# Patient Record
Sex: Female | Born: 1935
Health system: Southern US, Community
[De-identification: ages and names within clinical notes are randomized; demographics above are authoritative.]

## PROBLEM LIST (undated history)

## (undated) DIAGNOSIS — I4891 Unspecified atrial fibrillation: Secondary | ICD-10-CM

## (undated) DIAGNOSIS — R519 Headache, unspecified: Secondary | ICD-10-CM

## (undated) DIAGNOSIS — I340 Nonrheumatic mitral (valve) insufficiency: Secondary | ICD-10-CM

## (undated) DIAGNOSIS — C4442 Squamous cell carcinoma of skin of scalp and neck: Secondary | ICD-10-CM

## (undated) DIAGNOSIS — J189 Pneumonia, unspecified organism: Secondary | ICD-10-CM

## (undated) DIAGNOSIS — I341 Nonrheumatic mitral (valve) prolapse: Secondary | ICD-10-CM

## (undated) DIAGNOSIS — I472 Ventricular tachycardia: Secondary | ICD-10-CM

## (undated) DIAGNOSIS — K219 Gastro-esophageal reflux disease without esophagitis: Secondary | ICD-10-CM

## (undated) DIAGNOSIS — K5790 Diverticulosis of intestine, part unspecified, without perforation or abscess without bleeding: Secondary | ICD-10-CM

## (undated) DIAGNOSIS — M419 Scoliosis, unspecified: Secondary | ICD-10-CM

## (undated) DIAGNOSIS — F329 Major depressive disorder, single episode, unspecified: Secondary | ICD-10-CM

## (undated) DIAGNOSIS — F419 Anxiety disorder, unspecified: Secondary | ICD-10-CM

## (undated) DIAGNOSIS — I429 Cardiomyopathy, unspecified: Secondary | ICD-10-CM

## (undated) DIAGNOSIS — R51 Headache: Secondary | ICD-10-CM

## (undated) DIAGNOSIS — C44712 Basal cell carcinoma of skin of right lower limb, including hip: Secondary | ICD-10-CM

## (undated) DIAGNOSIS — I493 Ventricular premature depolarization: Secondary | ICD-10-CM

## (undated) DIAGNOSIS — I480 Paroxysmal atrial fibrillation: Secondary | ICD-10-CM

## (undated) DIAGNOSIS — R001 Bradycardia, unspecified: Secondary | ICD-10-CM

## (undated) DIAGNOSIS — G43909 Migraine, unspecified, not intractable, without status migrainosus: Secondary | ICD-10-CM

## (undated) DIAGNOSIS — M1990 Unspecified osteoarthritis, unspecified site: Secondary | ICD-10-CM

## (undated) DIAGNOSIS — F32A Depression, unspecified: Secondary | ICD-10-CM

## (undated) DIAGNOSIS — E7800 Pure hypercholesterolemia, unspecified: Secondary | ICD-10-CM

## (undated) DIAGNOSIS — I4729 Other ventricular tachycardia: Secondary | ICD-10-CM

## (undated) HISTORY — PX: BASAL CELL CARCINOMA EXCISION: SHX1214

## (undated) HISTORY — DX: Nonrheumatic mitral (valve) prolapse: I34.1

## (undated) HISTORY — DX: Diverticulosis of intestine, part unspecified, without perforation or abscess without bleeding: K57.90

## (undated) HISTORY — PX: EXCISIONAL HEMORRHOIDECTOMY: SHX1541

## (undated) HISTORY — DX: Other ventricular tachycardia: I47.29

## (undated) HISTORY — PX: BUNIONECTOMY: SHX129

## (undated) HISTORY — DX: Cardiomyopathy, unspecified: I42.9

## (undated) HISTORY — PX: BREAST CYST EXCISION: SHX579

## (undated) HISTORY — DX: Paroxysmal atrial fibrillation: I48.0

## (undated) HISTORY — PX: TONSILLECTOMY: SUR1361

## (undated) HISTORY — DX: Bradycardia, unspecified: R00.1

## (undated) HISTORY — DX: Nonrheumatic mitral (valve) insufficiency: I34.0

## (undated) HISTORY — DX: Ventricular premature depolarization: I49.3

## (undated) HISTORY — DX: Ventricular tachycardia: I47.2

## (undated) HISTORY — DX: Scoliosis, unspecified: M41.9

## (undated) HISTORY — DX: Pure hypercholesterolemia, unspecified: E78.00

## (undated) HISTORY — DX: Unspecified atrial fibrillation: I48.91

## (undated) HISTORY — PX: BREAST BIOPSY: SHX20

## (undated) HISTORY — PX: DILATION AND CURETTAGE OF UTERUS: SHX78

## (undated) HISTORY — PX: BLEPHAROPLASTY: SUR158

## (undated) HISTORY — PX: SQUAMOUS CELL CARCINOMA EXCISION: SHX2433

## (undated) HISTORY — PX: BREAST CYST ASPIRATION: SHX578

---

## 1998-04-09 ENCOUNTER — Other Ambulatory Visit: Admission: RE | Admit: 1998-04-09 | Discharge: 1998-04-09 | Payer: Self-pay | Admitting: Gynecology

## 1998-05-23 ENCOUNTER — Ambulatory Visit (HOSPITAL_COMMUNITY): Admission: RE | Admit: 1998-05-23 | Discharge: 1998-05-23 | Payer: Self-pay | Admitting: Gynecology

## 1998-08-01 ENCOUNTER — Emergency Department (HOSPITAL_COMMUNITY): Admission: EM | Admit: 1998-08-01 | Discharge: 1998-08-02 | Payer: Self-pay | Admitting: Emergency Medicine

## 2000-07-14 ENCOUNTER — Other Ambulatory Visit: Admission: RE | Admit: 2000-07-14 | Discharge: 2000-07-14 | Payer: Self-pay | Admitting: Gynecology

## 2001-08-02 ENCOUNTER — Other Ambulatory Visit: Admission: RE | Admit: 2001-08-02 | Discharge: 2001-08-02 | Payer: Self-pay | Admitting: Gynecology

## 2001-11-27 ENCOUNTER — Encounter: Admission: RE | Admit: 2001-11-27 | Discharge: 2001-11-27 | Payer: Self-pay | Admitting: Internal Medicine

## 2001-11-27 ENCOUNTER — Encounter: Payer: Self-pay | Admitting: Internal Medicine

## 2001-12-11 ENCOUNTER — Emergency Department (HOSPITAL_COMMUNITY): Admission: EM | Admit: 2001-12-11 | Discharge: 2001-12-11 | Payer: Self-pay | Admitting: Emergency Medicine

## 2001-12-11 ENCOUNTER — Encounter: Payer: Self-pay | Admitting: Emergency Medicine

## 2002-05-04 ENCOUNTER — Encounter: Admission: RE | Admit: 2002-05-04 | Discharge: 2002-05-14 | Payer: Self-pay | Admitting: Orthopedic Surgery

## 2004-06-23 ENCOUNTER — Other Ambulatory Visit: Admission: RE | Admit: 2004-06-23 | Discharge: 2004-06-23 | Payer: Self-pay | Admitting: Gynecology

## 2005-07-29 ENCOUNTER — Other Ambulatory Visit: Admission: RE | Admit: 2005-07-29 | Discharge: 2005-07-29 | Payer: Self-pay | Admitting: Gynecology

## 2007-04-12 HISTORY — PX: KNEE ARTHROSCOPY: SHX127

## 2007-06-07 ENCOUNTER — Other Ambulatory Visit: Admission: RE | Admit: 2007-06-07 | Discharge: 2007-06-07 | Payer: Self-pay | Admitting: Gynecology

## 2008-05-09 ENCOUNTER — Ambulatory Visit: Payer: Self-pay | Admitting: Internal Medicine

## 2008-05-09 DIAGNOSIS — R059 Cough, unspecified: Secondary | ICD-10-CM | POA: Insufficient documentation

## 2008-05-09 DIAGNOSIS — R05 Cough: Secondary | ICD-10-CM

## 2008-05-10 ENCOUNTER — Ambulatory Visit (HOSPITAL_COMMUNITY): Admission: RE | Admit: 2008-05-10 | Discharge: 2008-05-10 | Payer: Self-pay | Admitting: Internal Medicine

## 2008-05-15 DIAGNOSIS — J309 Allergic rhinitis, unspecified: Secondary | ICD-10-CM | POA: Insufficient documentation

## 2008-05-17 ENCOUNTER — Telehealth: Payer: Self-pay | Admitting: Internal Medicine

## 2008-05-28 ENCOUNTER — Ambulatory Visit: Payer: Self-pay | Admitting: Internal Medicine

## 2008-05-31 DIAGNOSIS — R0602 Shortness of breath: Secondary | ICD-10-CM | POA: Insufficient documentation

## 2008-05-31 DIAGNOSIS — R0600 Dyspnea, unspecified: Secondary | ICD-10-CM | POA: Insufficient documentation

## 2008-06-10 ENCOUNTER — Ambulatory Visit: Payer: Self-pay | Admitting: Internal Medicine

## 2008-06-10 DIAGNOSIS — J209 Acute bronchitis, unspecified: Secondary | ICD-10-CM | POA: Insufficient documentation

## 2008-07-11 ENCOUNTER — Other Ambulatory Visit: Admission: RE | Admit: 2008-07-11 | Discharge: 2008-07-11 | Payer: Self-pay | Admitting: Gynecology

## 2008-08-08 ENCOUNTER — Ambulatory Visit (HOSPITAL_COMMUNITY): Admission: RE | Admit: 2008-08-08 | Discharge: 2008-08-08 | Payer: Self-pay | Admitting: Gynecology

## 2008-08-08 ENCOUNTER — Ambulatory Visit: Payer: Self-pay | Admitting: Internal Medicine

## 2008-08-14 ENCOUNTER — Encounter: Admission: RE | Admit: 2008-08-14 | Discharge: 2008-08-14 | Payer: Self-pay | Admitting: Gynecology

## 2008-08-16 ENCOUNTER — Emergency Department (HOSPITAL_COMMUNITY): Admission: EM | Admit: 2008-08-16 | Discharge: 2008-08-17 | Payer: Self-pay | Admitting: Emergency Medicine

## 2009-07-29 ENCOUNTER — Ambulatory Visit: Payer: Self-pay | Admitting: Internal Medicine

## 2009-07-29 ENCOUNTER — Telehealth (INDEPENDENT_AMBULATORY_CARE_PROVIDER_SITE_OTHER): Payer: Self-pay | Admitting: *Deleted

## 2009-12-29 ENCOUNTER — Emergency Department (HOSPITAL_COMMUNITY): Admission: EM | Admit: 2009-12-29 | Discharge: 2009-12-29 | Payer: Self-pay | Admitting: Emergency Medicine

## 2009-12-29 ENCOUNTER — Encounter: Admission: RE | Admit: 2009-12-29 | Discharge: 2009-12-29 | Payer: Self-pay | Admitting: Cardiology

## 2010-06-24 ENCOUNTER — Ambulatory Visit (HOSPITAL_COMMUNITY): Admission: RE | Admit: 2010-06-24 | Discharge: 2010-06-24 | Payer: Self-pay | Admitting: Cardiology

## 2010-06-24 ENCOUNTER — Ambulatory Visit: Payer: Self-pay | Admitting: Cardiology

## 2010-12-22 ENCOUNTER — Ambulatory Visit: Payer: Self-pay | Admitting: Cardiology

## 2011-04-23 ENCOUNTER — Encounter: Payer: Self-pay | Admitting: *Deleted

## 2011-04-23 DIAGNOSIS — M419 Scoliosis, unspecified: Secondary | ICD-10-CM | POA: Insufficient documentation

## 2011-04-23 DIAGNOSIS — E7800 Pure hypercholesterolemia, unspecified: Secondary | ICD-10-CM | POA: Insufficient documentation

## 2011-04-23 DIAGNOSIS — Z8659 Personal history of other mental and behavioral disorders: Secondary | ICD-10-CM | POA: Insufficient documentation

## 2011-04-30 ENCOUNTER — Encounter: Payer: Self-pay | Admitting: Nurse Practitioner

## 2011-04-30 ENCOUNTER — Ambulatory Visit (INDEPENDENT_AMBULATORY_CARE_PROVIDER_SITE_OTHER): Payer: Medicare Other | Admitting: Nurse Practitioner

## 2011-04-30 VITALS — BP 120/72 | HR 60 | Wt 156.0 lb

## 2011-04-30 DIAGNOSIS — R002 Palpitations: Secondary | ICD-10-CM

## 2011-04-30 DIAGNOSIS — Z8659 Personal history of other mental and behavioral disorders: Secondary | ICD-10-CM

## 2011-04-30 NOTE — Assessment & Plan Note (Signed)
Felt to be due to her MVP and MR. Last echo was in September of 2011. She does not like the Zebeta. She thinks it is aggravating her fatigue. We will try Bystolic 2.5 mg for the next month. I will see her back in a month. Patient is agreeable to this plan and will call if any problems develop in the interim.

## 2011-04-30 NOTE — Assessment & Plan Note (Signed)
She seems depressed to me today. I encouraged her to speak with Dr. Toy Care about other add on therapies. Exercise is also encouraged.

## 2011-04-30 NOTE — Progress Notes (Signed)
    [name] Date of Birth: October 22, 1932   History of Present Illness: Kawanis is seen today for a work in visit. She is seen for Dr. Mare Ferrari. She missed her appointment in March. Her husband did not understand the message from "[place]". She is still fatigued. She does not like the Zebeta. She still has some palpitations. She is not all that good about her caffeine intake. She has a history of depression. Dr. Toy Care had tried her on Abilify. This only aggravated her palpitations. She does remain depressed. She has MVP with MR. Last echo was in 2011.   Current Outpatient Prescriptions on File Prior to Visit  Medication Sig Dispense Refill  . bisoprolol (ZEBETA) 5 MG tablet Take 5 mg by mouth daily. Takes 1/2 daily       . Calcium Carbonate-Vitamin D (CALTRATE 600+D PO) Take by mouth.       . escitalopram (LEXAPRO) 20 MG tablet Take 20 mg by mouth daily. Per Dr. Toy Care       . LORazepam (ATIVAN) 1 MG tablet Take 1 mg by mouth at bedtime as needed.          Allergies  Allergen Reactions  . Paxil     HA  . Penicillins   . Toprol Xl (Metoprolol Succinate)     dizziness  . Vancomycin     Past Medical History  Diagnosis Date  . MVP (mitral valve prolapse)   . Palpitations   . Hypercholesterolemia   . History of anxiety   . History of depression   . Diverticulosis     Hx. of  . Scoliosis     mild  . Osteoporosis     Past Surgical History  Procedure Date  . Other surgical history 04/12/2007    ortopedic,arthroscopic right knee  . Tonsillectomy   . Blepharoplasty     History  Smoking status  . Former Smoker  Smokeless tobacco  . Not on file    History  Alcohol Use No    Family History  Problem Relation Age of Onset  . Heart failure Mother   . Leukemia Mother   . Heart failure Father     Review of Systems: The review of systems is positive for depression. She does not exercise. Sleeping too much.  All other systems were reviewed and are  negative.  Physical Exam: BP 120/72  Pulse 60  Wt 156 lb (70.761 kg) Patient is very pleasant and in no acute distress. She does seem depressed to me. Skin is warm and dry. Color is normal.  HEENT is unremarkable. Normocephalic/atraumatic. PERRL. Sclera are nonicteric. Neck is supple. No masses. No JVD. Lungs are clear. Cardiac exam shows a regular rate and rhythm with an apical murmur. Abdomen is soft. Extremities are without edema. Gait and ROM are intact. No gross neurologic deficits noted.  LABORATORY DATA:   Assessment / Plan:

## 2011-04-30 NOTE — Patient Instructions (Signed)
Lets try the Bystolic 1/2 tablet in the place of the Zebeta I will see you in a month Discuss with Dr. Toy Care your lexapro. ? Change or add to it. No more stimulant type medicines

## 2011-05-04 ENCOUNTER — Telehealth: Payer: Self-pay | Admitting: Cardiology

## 2011-05-04 NOTE — Telephone Encounter (Signed)
Called because the [employer] prescribed does not have a half line down the middle and she tried to cut the pill in half and could not. Please call back. I have pulled the chart.

## 2011-05-04 NOTE — Telephone Encounter (Signed)
Spoke with Cecille Rubin, ok to try cutting Bystolic in half.  Will get a pill cutter and if has problems cutting will call back

## 2011-06-01 ENCOUNTER — Ambulatory Visit: Payer: Medicare Other | Admitting: Nurse Practitioner

## 2011-06-03 ENCOUNTER — Encounter: Payer: Self-pay | Admitting: Nurse Practitioner

## 2011-06-03 ENCOUNTER — Ambulatory Visit (INDEPENDENT_AMBULATORY_CARE_PROVIDER_SITE_OTHER): Payer: Medicare Other | Admitting: Nurse Practitioner

## 2011-06-03 VITALS — BP 102/70 | HR 60 | Ht 65.0 in | Wt 155.8 lb

## 2011-06-03 DIAGNOSIS — F32A Depression, unspecified: Secondary | ICD-10-CM

## 2011-06-03 DIAGNOSIS — F329 Major depressive disorder, single episode, unspecified: Secondary | ICD-10-CM

## 2011-06-03 DIAGNOSIS — Z8659 Personal history of other mental and behavioral disorders: Secondary | ICD-10-CM

## 2011-06-03 DIAGNOSIS — R002 Palpitations: Secondary | ICD-10-CM

## 2011-06-03 NOTE — Progress Notes (Signed)
    [name] Date of Birth: [birth year]   History of Present Illness: [name] is seen back today for a one month check. She is seen for Dr. Mare Ferrari. She is doing better. She has had Wellbutrin added to her Lexapro. I switched her to Bystolic. She is doing better. She saw Dr. Toy Care yesterday who also thought she was better. Her palpitations seem better. She is more active. No chest pain.   Current Outpatient Prescriptions on File Prior to Visit  Medication Sig Dispense Refill  . buPROPion (WELLBUTRIN SR) 150 MG 12 hr tablet Take 150 mg by mouth as needed.        . Calcium Carbonate-Vitamin D (CALTRATE 600+D PO) Take by mouth.       . escitalopram (LEXAPRO) 20 MG tablet Take 20 mg by mouth daily. Per Dr. Toy Care       . LORazepam (ATIVAN) 1 MG tablet Take 1 mg by mouth at bedtime as needed.        . nebivolol (BYSTOLIC) 5 MG tablet Take 2.5 mg by mouth daily.          Allergies  Allergen Reactions  . Paxil     HA  . Penicillins   . Toprol Xl (Metoprolol Succinate)     dizziness  . Vancomycin     Past Medical History  Diagnosis Date  . MVP (mitral valve prolapse)   . Palpitations   . Hypercholesterolemia   . History of anxiety   . History of depression   . Diverticulosis     Hx. of  . Scoliosis     mild  . Osteoporosis     Past Surgical History  Procedure Date  . Other surgical history 04/12/2007    ortopedic,arthroscopic right knee  . Tonsillectomy   . Blepharoplasty     History  Smoking status  . Former Smoker  Smokeless tobacco  . Not on file    History  Alcohol Use No    Family History  Problem Relation Age of Onset  . Heart failure Mother   . Leukemia Mother   . Heart failure Father     Review of Systems: The review of systems is as above.  All other systems were reviewed and are negative.  Physical Exam: BP 102/70  Pulse 60  Ht 5\' 5"  (1.651 m)  Wt 155 lb 12.8 oz (70.67 kg)  BMI 25.93 kg/m2 Patient is pleasant and in no acute  distress. Mood is more upbeat. Skin is warm and dry. Color is normal.  HEENT is unremarkable. Normocephalic/atraumatic. PERRL. Sclera are nonicteric. Neck is supple. No masses. No JVD. Lungs are clear. Cardiac exam shows a regular rate and rhythm. Abdomen is soft. Extremities are without edema. Gait and ROM are intact. No gross neurologic deficits noted.  LABORATORY DATA:   Assessment / Plan:

## 2011-06-03 NOTE — Patient Instructions (Signed)
I think you are doing better Let's stay on the low dose Bystolic We will see you back in 4 months Call for any problems.

## 2011-06-03 NOTE — Assessment & Plan Note (Signed)
She seems better on the Bystolic. She is agreeable to staying on her current regimen. Will see her back in 4 months. Patient is agreeable to this plan and will call if any problems develop in the interim.

## 2011-06-03 NOTE — Assessment & Plan Note (Signed)
She looks better to me from this standpoint. I think the add on therapy is helping.

## 2011-07-13 LAB — DIFFERENTIAL
Eosinophils Relative: 1
Lymphocytes Relative: 18
Monocytes Relative: 8
Neutro Abs: 7.6
Neutrophils Relative %: 72

## 2011-07-13 LAB — URINALYSIS, ROUTINE W REFLEX MICROSCOPIC
Protein, ur: NEGATIVE
Specific Gravity, Urine: 1.007
Urobilinogen, UA: 0.2

## 2011-07-13 LAB — COMPREHENSIVE METABOLIC PANEL
AST: 26
Albumin: 4.1
BUN: 8
Creatinine, Ser: 0.75
GFR calc Af Amer: >60
Glucose, Bld: 98
Sodium: 139
Total Protein: 6.9

## 2011-07-13 LAB — CBC
Hemoglobin: 13.4
MCV: 93.2
RDW: 13.4

## 2011-07-13 LAB — LIPASE, BLOOD: Lipase: 35

## 2011-09-01 ENCOUNTER — Telehealth: Payer: Self-pay | Admitting: Cardiology

## 2011-09-01 NOTE — Telephone Encounter (Signed)
Patient was called samples of bystolic 5 mg lot [id number] 9/14 left at front desk.

## 2011-09-01 NOTE — Telephone Encounter (Signed)
New problem Pt wants samples of bystolic. She said you can leave message on machine. She could pick up on friday

## 2011-10-01 ENCOUNTER — Telehealth: Payer: Self-pay | Admitting: Cardiology

## 2011-10-01 NOTE — Telephone Encounter (Signed)
New message:  Cecille Rubin gave her samples back in August of Bystolic and she is down to 1 pill.  Do you have any samples until appt with Dr. Mare Ferrari?  Please call and advise patient if she can pick this up. [id number]

## 2011-10-01 NOTE — Telephone Encounter (Signed)
Samples lot # [id number] exp 07/14 pulled and advised patient

## 2011-10-08 ENCOUNTER — Ambulatory Visit (INDEPENDENT_AMBULATORY_CARE_PROVIDER_SITE_OTHER): Payer: Medicare Other | Admitting: Cardiology

## 2011-10-08 ENCOUNTER — Encounter: Payer: Self-pay | Admitting: Cardiology

## 2011-10-08 VITALS — BP 112/68 | HR 56 | Ht 65.0 in | Wt 157.0 lb

## 2011-10-08 DIAGNOSIS — E7800 Pure hypercholesterolemia, unspecified: Secondary | ICD-10-CM

## 2011-10-08 DIAGNOSIS — R9431 Abnormal electrocardiogram [ECG] [EKG]: Secondary | ICD-10-CM

## 2011-10-08 DIAGNOSIS — I341 Nonrheumatic mitral (valve) prolapse: Secondary | ICD-10-CM

## 2011-10-08 DIAGNOSIS — R002 Palpitations: Secondary | ICD-10-CM

## 2011-10-08 DIAGNOSIS — F329 Major depressive disorder, single episode, unspecified: Secondary | ICD-10-CM

## 2011-10-08 DIAGNOSIS — I059 Rheumatic mitral valve disease, unspecified: Secondary | ICD-10-CM

## 2011-10-08 MED ORDER — NEBIVOLOL HCL 5 MG PO TABS: ORAL_TABLET | ORAL | Status: DC

## 2011-10-08 NOTE — Assessment & Plan Note (Signed)
The patient had recent blood work at her primary care physician's office her LDL is elevated at 126.  She is not on statin therapy at this time.  She will work harder on diet and weight loss and exercise

## 2011-10-08 NOTE — Assessment & Plan Note (Signed)
The patient continues to have occasional palpitations.  No sustained arrhythmias.  No prolonged chest pain.

## 2011-10-08 NOTE — Progress Notes (Signed)
Carol Hardin Date of Birth:  01-May-1933 [facility] [id number]   [place], [place]  [address] [phone]           Fax   [phone]  History of Present Illness: This pleasant 77 year old woman is seen for a scheduled 6 month followup office visit.  She has a history of hypertension and mitral valve prolapse.  She's had a history of palpitations.  She's also had a history of depression and is followed by psychiatry and is presently doing well on Lexapro.  Current Outpatient Prescriptions  Medication Sig Dispense Refill  . escitalopram (LEXAPRO) 20 MG tablet Take 20 mg by mouth daily. Per Dr. Toy Care       . LORazepam (ATIVAN) 1 MG tablet Take 1 mg by mouth at bedtime.       . nebivolol (BYSTOLIC) 5 MG tablet TAKE 1/2 TABLET DAILY  30 tablet  11  . DISCONTD: nebivolol (BYSTOLIC) 5 MG tablet Take 2.5 mg by mouth daily.          Allergies  Allergen Reactions  . Paxil     HA  . Penicillins   . Toprol Xl (Metoprolol Succinate)     dizziness  . Vancomycin     Patient Active Problem List  Diagnoses  . BRONCHITIS, ACUTE WITH BRONCHOSPASM  . ALLERGIC RHINITIS  . DYSPNEA  . COUGH  . MVP (mitral valve prolapse)  . Palpitations  . Hypercholesterolemia  . History of anxiety  . History of depression  . Diverticulosis  . Scoliosis  . Scoliosis  . Osteoporosis    History  Smoking status  . Former Smoker  Smokeless tobacco  . Not on file    History  Alcohol Use No    Family History  Problem Relation Age of Onset  . Heart failure Mother   . Leukemia Mother   . Heart failure Father     Review of Systems: Constitutional: no fever chills diaphoresis or fatigue or change in weight.  Head and neck: no hearing loss, no epistaxis, no photophobia or visual disturbance. Respiratory: No cough, shortness of breath or wheezing. Cardiovascular: No chest pain peripheral edema, palpitations. Gastrointestinal: No abdominal  distention, no abdominal pain, no change in bowel habits hematochezia or melena. Genitourinary: No dysuria, no frequency, no urgency, no nocturia. Musculoskeletal:No arthralgias, no back pain, no gait disturbance or myalgias. Neurological: No dizziness, no headaches, no numbness, no seizures, no syncope, no weakness, no tremors. Hematologic: No lymphadenopathy, no easy bruising. Psychiatric: No confusion, no hallucinations, no sleep disturbance.    Physical Exam: Filed Vitals:   10/08/11 1517  BP: 112/68  Pulse: 56   the general appearance reveals a well-developed well-nourished woman in no distress.Pupils equal and reactive.   Extraocular Movements are full.  There is no scleral icterus.  The mouth and pharynx are normal.  The neck is supple.  The carotids reveal no bruits.  The jugular venous pressure is normal.  The thyroid is not enlarged.  There is no lymphadenopathy.  The chest is clear to percussion and auscultation. There are no rales or rhonchi. Expansion of the chest is symmetrical.  Heart reveals a soft apical systolic murmurThe abdomen is soft and nontender. Bowel sounds are normal. The liver and spleen are not enlarged. There Are no abdominal masses. There are no bruits.  The pedal pulses are good.  There is no phlebitis or edema.  There is no cyanosis or clubbing. Strength is normal and  symmetrical in all extremities.  There is no lateralizing weakness.  There are no sensory deficits.  The skin is warm and dry.  There is no rash.  EKG shows improvement since 12/24/09.  The previous lateral ST segment abnormalities have resolved.   Assessment / Plan:  Continue same medication.  Recheck in 6 months.  He was concerned about her family history of abdominal aortic aneurysm in her mother.  Came back in her chart we did do a ultrasound of her abdomen in March 2011 which showed no evidence of abdominal aortic aneurysm.

## 2011-10-08 NOTE — Patient Instructions (Signed)
Your physician recommends that you continue on your current medications as directed. Please refer to the Current Medication list given to you today.  Your physician wants you to follow-up in: 6 months. You will receive a reminder letter in the mail two months in advance. If you don't receive a letter, please call our office to schedule the follow-up appointment.  

## 2011-10-08 NOTE — Assessment & Plan Note (Signed)
Her last echocardiogram 06/24/10 showed mitral valve prolapse with moderate mitral regurgitation.  She had normal pulmonary artery pressure.  The patient is not experiencing any symptoms of congestive heart failure.  He does have normal systolic function with diastolic dysfunction.

## 2011-10-13 ENCOUNTER — Telehealth: Payer: Self-pay | Admitting: Cardiology

## 2011-10-13 NOTE — Telephone Encounter (Signed)
Advised ok to take

## 2011-10-13 NOTE — Telephone Encounter (Signed)
New Msg: pt calling wanting to know if she can take Advil considering pt is taking bystolic. Please return pt call to discuss further.

## 2011-10-20 ENCOUNTER — Ambulatory Visit (INDEPENDENT_AMBULATORY_CARE_PROVIDER_SITE_OTHER): Payer: Medicare Other | Admitting: Cardiology

## 2011-10-20 ENCOUNTER — Encounter: Payer: Self-pay | Admitting: Cardiology

## 2011-10-20 VITALS — BP 108/60 | HR 60 | Ht 65.0 in | Wt 158.0 lb

## 2011-10-20 DIAGNOSIS — R002 Palpitations: Secondary | ICD-10-CM

## 2011-10-20 DIAGNOSIS — I341 Nonrheumatic mitral (valve) prolapse: Secondary | ICD-10-CM

## 2011-10-20 DIAGNOSIS — R0602 Shortness of breath: Secondary | ICD-10-CM

## 2011-10-20 DIAGNOSIS — I059 Rheumatic mitral valve disease, unspecified: Secondary | ICD-10-CM

## 2011-10-20 MED ORDER — BISOPROLOL FUMARATE 5 MG PO TABS: ORAL_TABLET | ORAL | Status: DC

## 2011-10-20 NOTE — Assessment & Plan Note (Signed)
Her palpitations have been worse on her new medication.  We will switch back to bisoprolol

## 2011-10-20 NOTE — Patient Instructions (Signed)
Stop Bystolic and go back on Bisoprolol 5 mg 1/2 daily Will see you back sometime in June

## 2011-10-20 NOTE — Progress Notes (Signed)
Carol Hardin Date of Birth:  [birth year] [facility] [address] [place], [place]  [address] [phone]  Fax   [phone]  HPI: This pleasant 78 year old woman is seen for a work in followup office visit.  She has not been feeling as well since she has been on Bystolic.  She has had a lot of his side effects that she read about in the brochure from the pharmacy.  She feels that she was doing better when she was on bisoprolol.  He has been sleeping poorly recently.  Current Outpatient Prescriptions  Medication Sig Dispense Refill  . Calcium Citrate (CITRACAL PO) Take by mouth daily.      Marland Kitchen escitalopram (LEXAPRO) 20 MG tablet Take 20 mg by mouth daily. Per Dr. Toy Care       . LORazepam (ATIVAN) 1 MG tablet Take 1 mg by mouth at bedtime.       . bisoprolol (ZEBETA) 5 MG tablet 1/2 daily or as directed  30 tablet  11    Allergies  Allergen Reactions  . Paxil     HA  . Penicillins   . Toprol Xl (Metoprolol Succinate)     dizziness  . Vancomycin     Patient Active Problem List  Diagnoses  . BRONCHITIS, ACUTE WITH BRONCHOSPASM  . ALLERGIC RHINITIS  . DYSPNEA  . COUGH  . MVP (mitral valve prolapse)  . Palpitations  . Hypercholesterolemia  . History of anxiety  . History of depression  . Diverticulosis  . Scoliosis  . Scoliosis  . Osteoporosis    History  Smoking status  . Former Smoker  Smokeless tobacco  . Not on file    History  Alcohol Use No    Family History  Problem Relation Age of Onset  . Heart failure Mother   . Leukemia Mother   . Heart failure Father     Review of Systems: The patient denies any heat or cold intolerance.  No weight gain or weight loss.  The patient denies headaches or blurry vision.  There is no cough or sputum production.  The patient denies dizziness.  There is no hematuria or hematochezia.  The patient denies any muscle aches or arthritis.  The patient denies any rash.  The patient denies frequent  falling or instability.  There is no history of depression or anxiety.  All other systems were reviewed and are negative.   Physical Exam: Filed Vitals:   10/20/11 0958  BP: 108/60  Pulse: 60   on physical examination she is in no acute distress.Pupils equal and reactive.   Extraocular Movements are full.  There is no scleral icterus.  The mouth and pharynx are normal.  The neck is supple.  The carotids reveal no bruits.  The jugular venous pressure is normal.  The thyroid is not enlarged.  There is no lymphadenopathy.  The chest is clear to percussion and auscultation. There are no rales or rhonchi. Expansion of the chest is symmetrical.  Cardiac examination reveals a soft apical systolic murmur.The abdomen is soft and nontender. Bowel sounds are normal. The liver and spleen are not enlarged. There Are no abdominal masses. There are no bruits.  The pedal pulses are good.  There is no phlebitis or edema.  There is no cyanosis or clubbing. Strength is normal and symmetrical in all extremities.  There is no lateralizing weakness.  There are no sensory deficits.  The skin is warm and dry.  There is no rash.  Assessment / Plan:  Stop Bystolic and switched back to bisoprolol 5 mg tablets taking one half tablet daily.  Regular appointment scheduled for June 2013

## 2011-10-20 NOTE — Assessment & Plan Note (Signed)
Her dyspnea has been stable since last visit.  She is not having any angina pectoris

## 2012-07-25 ENCOUNTER — Ambulatory Visit (INDEPENDENT_AMBULATORY_CARE_PROVIDER_SITE_OTHER): Payer: Medicare Other | Admitting: Cardiology

## 2012-07-25 ENCOUNTER — Encounter: Payer: Self-pay | Admitting: Cardiology

## 2012-07-25 VITALS — BP 128/60 | HR 62 | Resp 18 | Ht 65.0 in | Wt 159.0 lb

## 2012-07-25 DIAGNOSIS — I341 Nonrheumatic mitral (valve) prolapse: Secondary | ICD-10-CM

## 2012-07-25 DIAGNOSIS — Z8659 Personal history of other mental and behavioral disorders: Secondary | ICD-10-CM

## 2012-07-25 DIAGNOSIS — I059 Rheumatic mitral valve disease, unspecified: Secondary | ICD-10-CM

## 2012-07-25 NOTE — Patient Instructions (Addendum)
Your physician recommends that you continue on your current medications as directed. Please refer to the Current Medication list given to you today.  Your physician wants you to follow-up in: 6 months. You will receive a reminder letter in the mail two months in advance. If you don't receive a letter, please call our office to schedule the follow-up appointment.  

## 2012-07-25 NOTE — Assessment & Plan Note (Signed)
Since last visit the patient has had fewer cardiac symptoms.  She has not been experiencing any prolonged palpitations.  Her heart feels more calm.  She has not had any chest pain to suggest angina pectoris.  She has had some musculoskeletal left-sided chest wall pain after bending over to get some pots out of her sink

## 2012-07-25 NOTE — Assessment & Plan Note (Signed)
Her symptoms of anxiety and depression are much improved.  She has become much more active in the ladies Circle group at her church etc.

## 2012-07-25 NOTE — Progress Notes (Signed)
Carol Hardin Date of Birth:  1933-03-05 [facility] [address] [place], [place]  [address] [phone]  Fax   [phone]  HPI: This pleasant 78 year old woman is seen for a six-month followup office visit.  She has a past history of palpitations a past history of essential hypertension she is also had problems in the past with depression and anxiety.  She has a remote history of suspected mitral valve prolapse.  Current Outpatient Prescriptions  Medication Sig Dispense Refill  . bisoprolol (ZEBETA) 5 MG tablet 1/2 daily or as directed  30 tablet  11  . Calcium Citrate (CITRACAL PO) Take by mouth daily.      Marland Kitchen escitalopram (LEXAPRO) 20 MG tablet Take 20 mg by mouth daily. Per Dr. Toy Care       . LORazepam (ATIVAN) 1 MG tablet Take 1 mg by mouth at bedtime.         Allergies  Allergen Reactions  . Paroxetine Hcl     HA  . Penicillins   . Toprol Xl (Metoprolol Succinate)     dizziness  . Vancomycin     Patient Active Problem List  Diagnosis  . BRONCHITIS, ACUTE WITH BRONCHOSPASM  . ALLERGIC RHINITIS  . DYSPNEA  . COUGH  . MVP (mitral valve prolapse)  . Palpitations  . Hypercholesterolemia  . History of anxiety  . History of depression  . Diverticulosis  . Scoliosis  . Scoliosis  . Osteoporosis    History  Smoking status  . Former Smoker  Smokeless tobacco  . Not on file    History  Alcohol Use No    Family History  Problem Relation Age of Onset  . Heart failure Mother   . Leukemia Mother   . Heart failure Father     Review of Systems: The patient denies any heat or cold intolerance.  No weight gain or weight loss.  The patient denies headaches or blurry vision.  There is no cough or sputum production.  The patient denies dizziness.  There is no hematuria or hematochezia.  The patient denies any muscle aches or arthritis.  The patient denies any rash.  The patient denies frequent falling or instability.  There is no history  of depression or anxiety.  All other systems were reviewed and are negative.   Physical Exam: Filed Vitals:   07/25/12 0920  BP: 128/60  Pulse: 62  Resp: 18   the general appearance reveals a well-developed well-nourished woman in no distress.The head and neck exam reveals pupils equal and reactive.  Extraocular movements are full.  There is no scleral icterus.  The mouth and pharynx are normal.  The neck is supple.  The carotids reveal no bruits.  The jugular venous pressure is normal.  The  thyroid is not enlarged.  There is no lymphadenopathy.  The chest is clear to percussion and auscultation.  There are no rales or rhonchi.  Expansion of the chest is symmetrical.  The precordium is quiet.  The first heart sound is normal.  The second heart sound is physiologically split.  There is no  gallop rub or click.  There is a faint apical systolic murmur in the left lateral decubitus There is no abnormal lift or heave.  The abdomen is soft and nontender.  The bowel sounds are normal.  The liver and spleen are not enlarged.  There are no abdominal masses.  There are no abdominal bruits.  Extremities reveal good pedal pulses.  There is  no phlebitis or edema.  There is no cyanosis or clubbing.  Strength is normal and symmetrical in all extremities.  There is no lateralizing weakness.  There are no sensory deficits.  The skin is warm and dry.  There is no rash.      Assessment / Plan: Continue same medication and be rechecked in 6 months for office visit and EKG.

## 2012-11-03 ENCOUNTER — Other Ambulatory Visit: Payer: Self-pay

## 2012-11-03 DIAGNOSIS — R002 Palpitations: Secondary | ICD-10-CM

## 2012-11-03 MED ORDER — BISOPROLOL FUMARATE 5 MG PO TABS: ORAL_TABLET | ORAL | Status: DC

## 2012-12-12 ENCOUNTER — Other Ambulatory Visit: Payer: Self-pay | Admitting: Internal Medicine

## 2012-12-12 ENCOUNTER — Ambulatory Visit
Admission: RE | Admit: 2012-12-12 | Discharge: 2012-12-12 | Disposition: A | Discharge status: Home / Self Care | Payer: Medicare Other | Source: Ambulatory Visit | Attending: Internal Medicine | Admitting: Internal Medicine

## 2012-12-12 DIAGNOSIS — K5792 Diverticulitis of intestine, part unspecified, without perforation or abscess without bleeding: Secondary | ICD-10-CM

## 2012-12-12 MED ORDER — IOHEXOL 300 MG/ML  SOLN
100.0000 mL | Freq: Once | INTRAMUSCULAR | Status: AC | PRN
  Administered 2012-12-12: 100 mL via INTRAVENOUS

## 2013-01-18 ENCOUNTER — Ambulatory Visit (INDEPENDENT_AMBULATORY_CARE_PROVIDER_SITE_OTHER): Payer: Medicare Other | Admitting: Cardiology

## 2013-01-18 ENCOUNTER — Encounter: Payer: Self-pay | Admitting: Cardiology

## 2013-01-18 VITALS — BP 116/72 | HR 54 | Ht 65.0 in | Wt 159.4 lb

## 2013-01-18 DIAGNOSIS — I341 Nonrheumatic mitral (valve) prolapse: Secondary | ICD-10-CM

## 2013-01-18 DIAGNOSIS — Z8659 Personal history of other mental and behavioral disorders: Secondary | ICD-10-CM

## 2013-01-18 DIAGNOSIS — I059 Rheumatic mitral valve disease, unspecified: Secondary | ICD-10-CM

## 2013-01-18 DIAGNOSIS — R002 Palpitations: Secondary | ICD-10-CM

## 2013-01-18 NOTE — Progress Notes (Signed)
[name] Date of Birth:  November 07, 1932 [facility] [address] [place], [place]  [address] [phone]  Fax   [phone]  HPI: This pleasant 79 year old woman is seen for a scheduled followup office visit.  She has a past history of palpitations and mitral valve prolapse.  She does not have a echocardiogram report in Epic.  She does not have any history of ischemic heart disease.  She does have a history of chronic depression and is followed by Dr.Kaur.  Dr. Nehemiah Settle is her internist. Since last visit she feels that the switch back to bisoprolol from Bystolic has been helpful.  Current Outpatient Prescriptions  Medication Sig Dispense Refill  . bisoprolol (ZEBETA) 5 MG tablet 1/2 daily or as directed  30 tablet  6  . Calcium Citrate (CITRACAL PO) Take by mouth daily.      Marland Kitchen escitalopram (LEXAPRO) 10 MG tablet Take 10 mg by mouth daily.      Marland Kitchen LORazepam (ATIVAN) 1 MG tablet Take 1 mg by mouth at bedtime.        No current facility-administered medications for this visit.    Allergies  Allergen Reactions  . Paroxetine Hcl     HA  . Penicillins   . Toprol Xl (Metoprolol Succinate)     dizziness  . Vancomycin     Patient Active Problem List  Diagnosis  . BRONCHITIS, ACUTE WITH BRONCHOSPASM  . ALLERGIC RHINITIS  . DYSPNEA  . COUGH  . MVP (mitral valve prolapse)  . Palpitations  . Hypercholesterolemia  . History of anxiety  . History of depression  . Diverticulosis  . Scoliosis  . Scoliosis  . Osteoporosis    History  Smoking status  . Former Smoker  Smokeless tobacco  . Not on file    History  Alcohol Use No    Family History  Problem Relation Age of Onset  . Heart failure Mother   . Leukemia Mother   . Heart failure Father     Review of Systems: The patient denies any heat or cold intolerance.  No weight gain or weight loss.  The patient denies headaches or blurry vision.  There is no cough or sputum production.   The patient denies dizziness.  There is no hematuria or hematochezia.  The patient denies any muscle aches or arthritis.  The patient denies any rash.  The patient denies frequent falling or instability.  There is no history of depression or anxiety.  All other systems were reviewed and are negative.   Physical Exam: Filed Vitals:   01/18/13 1134  BP: 116/72  Pulse: 54   the general appearance reveals a well-developed somewhat anxious elderly woman in no distress.The head and neck exam reveals pupils equal and reactive.  Extraocular movements are full.  There is no scleral icterus.  The mouth and pharynx are normal.  The neck is supple.  The carotids reveal no bruits.  The jugular venous pressure is normal.  The  thyroid is not enlarged.  There is no lymphadenopathy.  The chest is clear to percussion and auscultation.  There are no rales or rhonchi.  Expansion of the chest is symmetrical.  The precordium is quiet.  The first heart sound is normal.  The second heart sound is physiologically split.  There is a apical midsystolic click followed by a soft apical systolic murmur.  There is no abnormal lift or heave.  The abdomen is soft and nontender.  The bowel sounds are  normal.  The liver and spleen are not enlarged.  There are no abdominal masses.  There are no abdominal bruits.  Extremities reveal good pedal pulses.  There is no phlebitis or edema.  There is no cyanosis or clubbing.  Strength is normal and symmetrical in all extremities.  There is no lateralizing weakness.  There are no sensory deficits.  The skin is warm and dry.  There is no rash.   EKG today shows sinus bradycardia and no ischemic changes   Assessment / Plan: Continue same medication.  Recheck in 6 months for followup office visit.

## 2013-01-18 NOTE — Patient Instructions (Addendum)
Your physician recommends that you continue on your current medications as directed. Please refer to the Current Medication list given to you today.  Your physician wants you to follow-up in: [place] will receive a reminder letter in the mail two months in advance. If you don't receive a letter, please call our office to schedule the follow-up appointment.

## 2013-01-18 NOTE — Assessment & Plan Note (Signed)
The patient has not been experiencing any chest pain from her mitral valve prolapse.  There is occasional palpitations

## 2013-01-18 NOTE — Assessment & Plan Note (Signed)
The patient is still having problems with depression.  She states that there have been some incidences of personality conflicts with other members at her church.  She is considering changing churches. The patient continues to be followed by Dr. Toy Care.

## 2013-02-16 ENCOUNTER — Encounter: Payer: Self-pay | Admitting: Cardiology

## 2013-02-16 ENCOUNTER — Other Ambulatory Visit: Payer: Self-pay | Admitting: Cardiology

## 2013-02-19 ENCOUNTER — Encounter: Payer: Self-pay | Admitting: Cardiology

## 2013-03-29 ENCOUNTER — Encounter (HOSPITAL_COMMUNITY): Payer: Self-pay

## 2013-03-29 ENCOUNTER — Emergency Department (HOSPITAL_COMMUNITY)
Admission: EM | Admit: 2013-03-29 | Discharge: 2013-03-29 | Disposition: A | Discharge status: Home / Self Care | Payer: Medicare Other | Attending: Emergency Medicine | Admitting: Emergency Medicine

## 2013-03-29 ENCOUNTER — Emergency Department (HOSPITAL_COMMUNITY): Payer: Medicare Other

## 2013-03-29 DIAGNOSIS — R51 Headache: Secondary | ICD-10-CM | POA: Insufficient documentation

## 2013-03-29 DIAGNOSIS — F3289 Other specified depressive episodes: Secondary | ICD-10-CM | POA: Insufficient documentation

## 2013-03-29 DIAGNOSIS — Z8739 Personal history of other diseases of the musculoskeletal system and connective tissue: Secondary | ICD-10-CM | POA: Insufficient documentation

## 2013-03-29 DIAGNOSIS — F411 Generalized anxiety disorder: Secondary | ICD-10-CM | POA: Insufficient documentation

## 2013-03-29 DIAGNOSIS — Z79899 Other long term (current) drug therapy: Secondary | ICD-10-CM | POA: Insufficient documentation

## 2013-03-29 DIAGNOSIS — Z8719 Personal history of other diseases of the digestive system: Secondary | ICD-10-CM | POA: Insufficient documentation

## 2013-03-29 DIAGNOSIS — H539 Unspecified visual disturbance: Secondary | ICD-10-CM | POA: Insufficient documentation

## 2013-03-29 DIAGNOSIS — H547 Unspecified visual loss: Secondary | ICD-10-CM

## 2013-03-29 DIAGNOSIS — Z87891 Personal history of nicotine dependence: Secondary | ICD-10-CM | POA: Insufficient documentation

## 2013-03-29 DIAGNOSIS — E7800 Pure hypercholesterolemia, unspecified: Secondary | ICD-10-CM | POA: Insufficient documentation

## 2013-03-29 DIAGNOSIS — F329 Major depressive disorder, single episode, unspecified: Secondary | ICD-10-CM | POA: Insufficient documentation

## 2013-03-29 DIAGNOSIS — Z8679 Personal history of other diseases of the circulatory system: Secondary | ICD-10-CM | POA: Insufficient documentation

## 2013-03-29 LAB — CBC WITH DIFFERENTIAL/PLATELET
Eosinophils Absolute: 0.2 10*3/uL (ref 0.0–0.7)
HCT: 37 % (ref 36.0–46.0)
Hemoglobin: 12.4 g/dL (ref 12.0–15.0)
Lymphs Abs: 1.1 10*3/uL (ref 0.7–4.0)
MCH: 31.1 pg (ref 26.0–34.0)
Monocytes Relative: 12 % (ref 3–12)
Neutro Abs: 2.1 10*3/uL (ref 1.7–7.7)
Neutrophils Relative %: 53 % (ref 43–77)
RBC: 3.99 MIL/uL (ref 3.87–5.11)

## 2013-03-29 LAB — BASIC METABOLIC PANEL
BUN: 11 mg/dL (ref 6–23)
Chloride: 106 mEq/L (ref 96–112)
Glucose, Bld: 88 mg/dL (ref 70–99)
Potassium: 3.9 mEq/L (ref 3.5–5.1)

## 2013-03-29 NOTE — ED Notes (Signed)
Pt dc'd home w/all belongings, alert and ambulatory upon dc, pt verbalizes understanding of dc instructions, no new rx given, driven home by spouse

## 2013-03-29 NOTE — ED Notes (Signed)
Dr. Gentry at bedside. 

## 2013-03-29 NOTE — ED Notes (Addendum)
Pt reports unable to get comfortable last night while trying to sleep, "head fullness," all over body tingling/prickling feelings, starting last night. Pt is concerned if her recent injection was the cause of her symptoms and wants to make sure she's not having a stroke. No neuro deficits noted, speech is clear, no facial droop.

## 2013-03-29 NOTE — ED Notes (Signed)
Patient transported to CT 

## 2013-03-29 NOTE — ED Notes (Signed)
Pt reports blurred vision, flashes, and "zig zag" patterns starting last and woke this am a headache. Pt states "I cant explain it but I have migraine variants."

## 2013-03-29 NOTE — ED Provider Notes (Signed)
History     CSN: [id number]  Arrival date & time 03/29/13  0930   First MD Initiated Contact with Patient 03/29/13 0935      Chief Complaint  Patient presents with  . Headache    (Consider location/radiation/quality/duration/timing/severity/associated sxs/prior treatment) Patient is a 79 y.o. female presenting with headaches.  Headache Pain location:  Generalized Quality: fullness. Radiates to:  Does not radiate Onset quality:  Gradual Duration:  14 hours Timing:  Constant Progression:  Partially resolved Chronicity:  New Similar to prior headaches: yes   Context comment:  Spontaneous Associated symptoms: no abdominal pain, no back pain, no congestion, no cough, no diarrhea, no dizziness, no fever, no nausea, no numbness, no photophobia, no sore throat and no vomiting     Past Medical History  Diagnosis Date  . MVP (mitral valve prolapse)   . Palpitations   . Hypercholesterolemia   . History of anxiety   . History of depression   . Diverticulosis     Hx. of  . Scoliosis     mild  . Osteoporosis     Past Surgical History  Procedure Laterality Date  . Other surgical history  04/12/2007    ortopedic,arthroscopic right knee  . Tonsillectomy    . Blepharoplasty      Family History  Problem Relation Age of Onset  . Heart failure Mother   . Leukemia Mother   . Heart failure Father     History  Substance Use Topics  . Smoking status: Former Research scientist (life sciences)  . Smokeless tobacco: Not on file  . Alcohol Use: No    OB History   Grav Para Term Preterm Abortions TAB SAB Ect Mult Living                  Review of Systems  Constitutional: Negative for fever and chills.  HENT: Negative for congestion, sore throat and rhinorrhea.   Eyes: Negative for photophobia and visual disturbance.  Respiratory: Negative for cough and shortness of breath.   Cardiovascular: Negative for chest pain and leg swelling.  Gastrointestinal: Negative for nausea, vomiting, abdominal pain,  diarrhea and constipation.  Endocrine: Negative for polyphagia and polyuria.  Genitourinary: Negative for dysuria, flank pain, vaginal bleeding, vaginal discharge and enuresis.  Musculoskeletal: Negative for back pain and gait problem.  Skin: Negative for color change and rash.  Neurological: Positive for headaches. Negative for dizziness, syncope, light-headedness and numbness.  Hematological: Negative for adenopathy. Does not bruise/bleed easily.  All other systems reviewed and are negative.    Allergies  Paroxetine hcl; Toprol xl; Vancomycin; and Penicillins  Home Medications   Current Outpatient Rx  Name  Route  Sig  Dispense  Refill  . bisoprolol (ZEBETA) 5 MG tablet      1/2 daily or as directed   30 tablet   6   . Calcium Citrate (CITRACAL PO)   Oral   Take 1 capsule by mouth daily.          Marland Kitchen escitalopram (LEXAPRO) 10 MG tablet   Oral   Take 10 mg by mouth daily.         Marland Kitchen LORazepam (ATIVAN) 1 MG tablet   Oral   Take 1 mg by mouth at bedtime.            BP 106/65  Pulse 52  Temp(Src) 98.2 F (36.8 C) (Oral)  Resp 16  SpO2 98%  Physical Exam  Vitals reviewed. Constitutional: She is oriented to person, place, and time.  She appears well-developed and well-nourished.  HENT:  Head: Normocephalic and atraumatic.  Right Ear: External ear normal.  Left Ear: External ear normal.  Eyes: Conjunctivae and EOM are normal. Pupils are equal, round, and reactive to light.  Neck: Normal range of motion. Neck supple.  Cardiovascular: Normal rate, regular rhythm, normal heart sounds and intact distal pulses.   Pulmonary/Chest: Effort normal and breath sounds normal.  Abdominal: Soft. Bowel sounds are normal. There is no tenderness.  Musculoskeletal: Normal range of motion.  Neurological: She is alert and oriented to person, place, and time. She has normal strength and normal reflexes. No cranial nerve deficit or sensory deficit. Gait normal. GCS eye subscore is 4.  GCS verbal subscore is 5. GCS motor subscore is 6.  Skin: Skin is warm and dry.    ED Course  Procedures (including critical care time)  Labs Reviewed  CBC WITH DIFFERENTIAL - Abnormal; Notable for the following:    WBC 3.9 (*)    All other components within normal limits  BASIC METABOLIC PANEL - Abnormal; Notable for the following:    GFR calc non Af Amer 64 (*)    GFR calc Af Amer 74 (*)    All other components within normal limits   Ct Head Wo Contrast  03/29/2013   *RADIOLOGY REPORT*  Clinical Data: Visual disturbance.  Headache.  Dizziness. Hypercholesterolemia.  CT HEAD WITHOUT CONTRAST  Technique:  Contiguous axial images were obtained from the base of the skull through the vertex without contrast.  Comparison: None.  Findings: No intracranial hemorrhage.  Hypodensity right lenticular nucleus/caudate region may represent combination of small vessel disease type changes and remote infarct however, acute infarct not excluded.  Additionally, subtle hypodensity left frontal lobe.  This may be related to result of small vessel disease although small acute infarct not excluded.  No hydrocephalus.  No intracranial mass lesion detected on this unenhanced exam.  Mastoid air cells, middle ear cavities and visualized paranasal sinuses are clear.  IMPRESSION: No intracranial hemorrhage.  Hypodensity right lenticular nucleus/caudate region may represent combination of small vessel disease type changes and remote infarct however, acute infarct not excluded.  Additionally, subtle hypodensity left frontal lobe.  This may be related to result of small vessel disease although small acute infarct not excluded.   Original Report Authenticated By: Genia Del, M.D.     1. Headache   2. Vision problem       MDM  79 y.o. female  with pertinent PMH of MVP, depression, anxiety presents with fullness in head, visual symptoms bilaterally (scotoma, blurring).  Pt endorses very vague symptoms, unable to  verbalize many of her complaints, however is not aphasic, dysphasic, and has no neuro deficits on exam.  She appears very anxious, and endorses taking an ativan for her symptoms yesterday.  Physical exam benign, pt not ataxic.   She has a ho similar symptoms with prior migraines, however was concerned about feeling jittery in bed and thought she was going to die, so presented here today after her call was not returned by physician.  Head CT unremarkable, labs unremarkable.  Feel pt stable to dc home with PC fu.  Also given strict return precautions for worsening of symptoms which have now almost completely remitted, and she voices understanding.     Labs and imaging as above reviewed by myself and attending,Dr. Audie Pinto, with whom case was discussed.   1. Headache   2. Vision problem  Rexene Agent, MD 03/29/13 1255

## 2013-03-31 NOTE — ED Provider Notes (Signed)
I saw and evaluated the patient, reviewed the resident's note and I agree with the findings and plan.   .Face to face Exam:  General:  Awake HEENT:  Atraumatic Resp:  Normal effort Abd:  Nondistended Neuro:No focal weakness   Dot Lanes, MD 03/31/13 [phone]

## 2013-05-21 ENCOUNTER — Encounter: Payer: Self-pay | Admitting: Cardiology

## 2013-07-17 ENCOUNTER — Ambulatory Visit: Payer: Medicare Other | Admitting: Cardiology

## 2013-07-18 ENCOUNTER — Encounter: Payer: Self-pay | Admitting: Cardiology

## 2013-07-18 ENCOUNTER — Ambulatory Visit (INDEPENDENT_AMBULATORY_CARE_PROVIDER_SITE_OTHER): Payer: Medicare Other | Admitting: Cardiology

## 2013-07-18 VITALS — BP 116/72 | HR 55 | Ht 65.0 in | Wt 160.0 lb

## 2013-07-18 DIAGNOSIS — Z8659 Personal history of other mental and behavioral disorders: Secondary | ICD-10-CM

## 2013-07-18 DIAGNOSIS — E7800 Pure hypercholesterolemia, unspecified: Secondary | ICD-10-CM

## 2013-07-18 DIAGNOSIS — I341 Nonrheumatic mitral (valve) prolapse: Secondary | ICD-10-CM

## 2013-07-18 DIAGNOSIS — I059 Rheumatic mitral valve disease, unspecified: Secondary | ICD-10-CM

## 2013-07-18 DIAGNOSIS — R002 Palpitations: Secondary | ICD-10-CM

## 2013-07-18 NOTE — Assessment & Plan Note (Signed)
The patient has a history of anxiety and depression.  She has had a good clinical response to being followed by her psychiatrist Dr.Kaur.

## 2013-07-18 NOTE — Patient Instructions (Signed)
Your physician has requested that you have an echocardiogram. Echocardiography is a painless test that uses sound waves to create images of your heart. It provides your doctor with information about the size and shape of your heart and how well your heart's chambers and valves are working. This procedure takes approximately one hour. There are no restrictions for this procedure.  Your physician recommends that you continue on your current medications as directed. Please refer to the Current Medication list given to you today.  Your physician wants you to follow-up in: 6 month ov/ekg You will receive a reminder letter in the mail two months in advance. If you don't receive a letter, please call our office to schedule the follow-up appointment.

## 2013-07-18 NOTE — Assessment & Plan Note (Signed)
The patient has not been experiencing any recent chest pain.  She denies any recent palpitations.  She is not having any symptoms of CHF.

## 2013-07-18 NOTE — Progress Notes (Signed)
Carol Hardin Date of Birth:  [birth year] [facility] [address] [place], [place]  [address] [phone]  Fax   [phone]  HPI: This pleasant 79 year old woman is seen for a scheduled followup office visit.  She has a past history of mitral valve prolapse.  Her last echocardiogram was in 2002 and showed an ejection fraction of 60-65% and moderate mitral valve prolapse.  She does not have any history of ischemic heart disease.  She does have a history of chronic depression and is followed by Dr.Kaur.  Dr. Nehemiah Hardin is her internist. Since last visit she feels that the switch back to bisoprolol from Bystolic has been helpful.  She takes the bisoprolol in the evening.  Current Outpatient Prescriptions  Medication Sig Dispense Refill  . bisoprolol (ZEBETA) 5 MG tablet 1/2 daily or as directed  30 tablet  6  . Calcium Citrate (CITRACAL PO) Take 1 capsule by mouth daily.       Marland Kitchen escitalopram (LEXAPRO) 10 MG tablet Take 10 mg by mouth daily.      Marland Kitchen LORazepam (ATIVAN) 1 MG tablet Take 1 mg by mouth at bedtime.        No current facility-administered medications for this visit.    Allergies  Allergen Reactions  . Paroxetine Hcl Other (See Comments)    Headaches (a long time ago- pt doesn't really remember)  . Toprol Xl [Metoprolol Succinate] Other (See Comments)    Dizziness--pt doesn't really remember   . Vancomycin Other (See Comments)    Pt reports that they gave it too fast- she started having itching in the scalp  . Penicillins Rash    Patient Active Problem List   Diagnosis Date Noted  . MVP (mitral valve prolapse)   . Palpitations   . Hypercholesterolemia   . History of anxiety   . History of depression   . Diverticulosis   . Scoliosis   . Scoliosis   . Osteoporosis   . BRONCHITIS, ACUTE WITH BRONCHOSPASM 06/10/2008  . DYSPNEA 05/31/2008  . ALLERGIC RHINITIS 05/15/2008  . COUGH 05/09/2008    History  Smoking status  . Former  Smoker  Smokeless tobacco  . Not on file    History  Alcohol Use No    Family History  Problem Relation Age of Onset  . Heart failure Mother   . Leukemia Mother   . Heart failure Father     Review of Systems: The patient denies any heat or cold intolerance.  No weight gain or weight loss.  The patient denies headaches or blurry vision.  There is no cough or sputum production.  The patient denies dizziness.  There is no hematuria or hematochezia.  The patient denies any muscle aches or arthritis.  The patient denies any rash.  The patient denies frequent falling or instability.  There is no history of depression or anxiety.  All other systems were reviewed and are negative.   Physical Exam: Filed Vitals:   07/18/13 1448  BP: 116/72  Pulse: 55   the general appearance reveals a well-developed somewhat anxious elderly woman in no distress.The head and neck exam reveals pupils equal and reactive.  Extraocular movements are full.  There is no scleral icterus.  The mouth and pharynx are normal.  The neck is supple.  The carotids reveal no bruits.  The jugular venous pressure is normal.  The  thyroid is not enlarged.  There is no lymphadenopathy.  The chest is clear to percussion  and auscultation.  There are no rales or rhonchi.  Expansion of the chest is symmetrical.  The precordium is quiet.  The first heart sound is normal.  The second heart sound is physiologically split.  There is a apical midsystolic click followed by a soft apical systolic murmur.  Mitral valve click and murmur are much more prominent when she stands up.  There is no abnormal lift or heave.  The abdomen is soft and nontender.  The bowel sounds are normal.  The liver and spleen are not enlarged.  There are no abdominal masses.  There are no abdominal bruits.  Extremities reveal good pedal pulses.  There is no phlebitis or edema.  There is no cyanosis or clubbing.  Strength is normal and symmetrical in all extremities.  There  is no lateralizing weakness.  There are no sensory deficits.  The skin is warm and dry.  There is no rash.     Assessment / Plan: Continue same medication.  Return soon for a two-dimensional echocardiogram for further evaluation of her mitral valve prolapse .Recheck in 6 months for followup office visit.

## 2013-07-18 NOTE — Assessment & Plan Note (Signed)
The patient has a history of hypercholesterolemia.  She is not presently on any lipid-lowering agents.  Her lipids are followed by her PCP.

## 2013-08-06 ENCOUNTER — Other Ambulatory Visit (HOSPITAL_COMMUNITY): Payer: Medicare Other

## 2013-08-20 ENCOUNTER — Other Ambulatory Visit (HOSPITAL_COMMUNITY): Payer: Medicare Other

## 2013-09-04 ENCOUNTER — Telehealth: Payer: Self-pay | Admitting: *Deleted

## 2013-09-04 NOTE — Telephone Encounter (Signed)
Patient has cancelled echo recommended by  Dr. Mare Ferrari at last ov (cancelled twice)  Will forward to  Dr. Mare Ferrari so he will be aware

## 2013-09-04 NOTE — Telephone Encounter (Signed)
Thanks for update

## 2013-12-13 ENCOUNTER — Other Ambulatory Visit: Payer: Self-pay | Admitting: Cardiology

## 2014-01-22 ENCOUNTER — Encounter (INDEPENDENT_AMBULATORY_CARE_PROVIDER_SITE_OTHER): Payer: Self-pay

## 2014-01-22 ENCOUNTER — Encounter: Payer: Self-pay | Admitting: Cardiology

## 2014-01-22 ENCOUNTER — Ambulatory Visit (INDEPENDENT_AMBULATORY_CARE_PROVIDER_SITE_OTHER): Payer: Medicare Other | Admitting: Cardiology

## 2014-01-22 VITALS — BP 110/60 | HR 56 | Ht 65.0 in | Wt 162.0 lb

## 2014-01-22 DIAGNOSIS — I493 Ventricular premature depolarization: Secondary | ICD-10-CM

## 2014-01-22 DIAGNOSIS — I4949 Other premature depolarization: Secondary | ICD-10-CM

## 2014-01-22 DIAGNOSIS — R002 Palpitations: Secondary | ICD-10-CM

## 2014-01-22 DIAGNOSIS — I059 Rheumatic mitral valve disease, unspecified: Secondary | ICD-10-CM

## 2014-01-22 DIAGNOSIS — Z8659 Personal history of other mental and behavioral disorders: Secondary | ICD-10-CM

## 2014-01-22 DIAGNOSIS — I341 Nonrheumatic mitral (valve) prolapse: Secondary | ICD-10-CM

## 2014-01-22 DIAGNOSIS — E7800 Pure hypercholesterolemia, unspecified: Secondary | ICD-10-CM

## 2014-01-22 NOTE — Patient Instructions (Signed)
Your physician recommends that you continue on your current medications as directed. Please refer to the Current Medication list given to you today.  Your physician wants you to follow-up in: 6 month ov You will receive a reminder letter in the mail two months in advance. If you don't receive a letter, please call our office to schedule the follow-up appointment.  

## 2014-01-22 NOTE — Assessment & Plan Note (Signed)
The patient has mitral valve prolapse.  She notes occasional palpitations.  No recent chest pain.  She has not been having any significant exertional dyspnea.  She enjoys working in her yard.

## 2014-01-22 NOTE — Assessment & Plan Note (Signed)
The patient has a past history of anxiety and depression.  She states that she was dismissed by her psychiatrist because she was on Medicare.  She does not yet have another psychiatrist.  Also, her present PCP has announced his retirement.

## 2014-01-22 NOTE — Assessment & Plan Note (Signed)
The patient has a past history of hypercholesterolemia.  She is not currently on any statin therapy.

## 2014-01-22 NOTE — Progress Notes (Addendum)
Carol Hardin Date of Birth:  Oct 07, 1933 [address] [place], [place]  [address] [phone]  Fax   [phone]  HPI: This pleasant 80 year old woman is seen for a scheduled followup office visit.  She has a past history of mitral valve prolapse.  Her last echocardiogram was in 2002 and showed an ejection fraction of 60-65% and moderate mitral valve prolapse.  She does not have any history of ischemic heart disease.  She does have a history of chronic depression and previously was followed by Dr.Kaur.  She states that she no longer is seeing her.  Dr. Nehemiah Hardin is her internist. Since last visit she feels that the switch back to bisoprolol from Bystolic has been helpful.  She takes the bisoprolol in the evening.  She states that the cost of the bisoprolol recently went up but she will continue to buy it because it has helped her. Current Outpatient Prescriptions  Medication Sig Dispense Refill  . bisoprolol (ZEBETA) 5 MG tablet TAKE A HALF TABLET BY MOUTH DAILY OR AS DIRECTED  30 tablet  1  . Calcium Citrate (CITRACAL PO) Take 1 capsule by mouth daily.       Marland Kitchen escitalopram (LEXAPRO) 10 MG tablet Take 10 mg by mouth daily.      Marland Kitchen LORazepam (ATIVAN) 1 MG tablet Take 1 mg by mouth at bedtime.        No current facility-administered medications for this visit.    Allergies  Allergen Reactions  . Paroxetine Hcl Other (See Comments)    Headaches (a long time ago- pt doesn't really remember)  . Toprol Xl [Metoprolol Succinate] Other (See Comments)    Dizziness--pt doesn't really remember   . Vancomycin Other (See Comments)    Pt reports that they gave it too fast- she started having itching in the scalp  . Penicillins Rash    Patient Active Problem List   Diagnosis Date Noted  . MVP (mitral valve prolapse)   . Palpitations   . Hypercholesterolemia   . History of anxiety   . History of depression   . Diverticulosis   . Scoliosis   . Scoliosis   .  Osteoporosis   . BRONCHITIS, ACUTE WITH BRONCHOSPASM 06/10/2008  . DYSPNEA 05/31/2008  . ALLERGIC RHINITIS 05/15/2008  . COUGH 05/09/2008    History  Smoking status  . Former Smoker  Smokeless tobacco  . Not on file    History  Alcohol Use No    Family History  Problem Relation Age of Onset  . Heart failure Mother   . Leukemia Mother   . Heart failure Father     Review of Systems: The patient denies any heat or cold intolerance.  No weight gain or weight loss.  The patient denies headaches or blurry vision.  There is no cough or sputum production.  The patient denies dizziness.  There is no hematuria or hematochezia.  The patient denies any muscle aches or arthritis.  The patient denies any rash.  The patient denies frequent falling or instability.  There is no history of depression or anxiety.  All other systems were reviewed and are negative.   Physical Exam: Filed Vitals:   01/22/14 1158  BP: 110/60  Pulse: 56   the general appearance reveals a well-developed somewhat anxious elderly woman in no distress.The head and neck exam reveals pupils equal and reactive.  Extraocular movements are full.  There is no scleral icterus.  The mouth and pharynx  are normal.  The neck is supple.  The carotids reveal no bruits.  The jugular venous pressure is normal.  The  thyroid is not enlarged.  There is no lymphadenopathy.  The chest is clear to percussion and auscultation.  There are no rales or rhonchi.  Expansion of the chest is symmetrical.  The precordium is quiet.  The first heart sound is normal.  The second heart sound is physiologically split.  There is a apical midsystolic click followed by a soft apical systolic murmur.  Mitral valve click and murmur are much more prominent when she stands up.  There is no abnormal lift or heave.  The abdomen is soft and nontender.  The bowel sounds are normal.  The liver and spleen are not enlarged.  There are no abdominal masses.  There are no  abdominal bruits.  Extremities reveal good pedal pulses.  There is no phlebitis or edema.  There is no cyanosis or clubbing.  Strength is normal and symmetrical in all extremities.  There is no lateralizing weakness.  There are no sensory deficits.  The skin is warm and dry.  There is no rash.     Assessment / Plan: Continue same medication.  Recheck in 6 months for followup office visit.  At her last visit we had set her up to return for an echocardiogram but she declined.

## 2014-02-05 ENCOUNTER — Other Ambulatory Visit: Payer: Self-pay | Admitting: *Deleted

## 2014-02-05 MED ORDER — BISOPROLOL FUMARATE 5 MG PO TABS: ORAL_TABLET | ORAL | Status: DC

## 2014-04-01 ENCOUNTER — Encounter: Payer: Self-pay | Admitting: Podiatry

## 2014-04-01 ENCOUNTER — Ambulatory Visit (INDEPENDENT_AMBULATORY_CARE_PROVIDER_SITE_OTHER): Payer: Medicare Other

## 2014-04-01 ENCOUNTER — Ambulatory Visit (INDEPENDENT_AMBULATORY_CARE_PROVIDER_SITE_OTHER): Payer: Medicare Other | Admitting: Podiatry

## 2014-04-01 VITALS — BP 127/73 | HR 68 | Resp 16

## 2014-04-01 DIAGNOSIS — M2041 Other hammer toe(s) (acquired), right foot: Secondary | ICD-10-CM

## 2014-04-01 DIAGNOSIS — M2040 Other hammer toe(s) (acquired), unspecified foot: Secondary | ICD-10-CM

## 2014-04-01 DIAGNOSIS — M779 Enthesopathy, unspecified: Secondary | ICD-10-CM

## 2014-04-01 MED ORDER — TRIAMCINOLONE ACETONIDE 10 MG/ML IJ SUSP
10.0000 mg | Freq: Once | INTRAMUSCULAR | Status: AC
  Administered 2014-04-01: 10 mg

## 2014-04-01 NOTE — Progress Notes (Signed)
Subjective:     Patient ID: Carol Hardin, female   DOB: 1933-03-20, 80 y.o.   MRN: [id number]  HPI patient presents stating I have a stub third toe that is really bothering me and making it hard for me to wear shoes. Do not remember specific injury   Review of Systems  All other systems reviewed and are negative.      Objective:   Physical Exam  Nursing note and vitals reviewed. Constitutional: She is oriented to person, place, and time.  Cardiovascular: Intact distal pulses.   Musculoskeletal: Normal range of motion.  Neurological: She is oriented to person, place, and time.  Skin: Skin is warm.   neurovascular status found to be intact with range of motion subtalar midtarsal joint which is adequate and muscle strength is within normal limits. Patient is found to have a edematous painful third toe right at the interphalangeal joint with no other forefoot pathology noted in digits that are well perfused. Mild diminishment of arch height is noted bilateral    Assessment:     Probable structural changes third toe right secondary to injury sustained with edema and interphalangeal joint capsulitis    Plan:     H&P and x-rays reviewed. Today I did a proximal nerve block and after appropriate numbness I injected around the interphalangeal joint to milligrams Kenalog 2 mg dexamethasone and then applied compressive dressing. Reappoint her recheck

## 2014-04-01 NOTE — Progress Notes (Signed)
   Subjective:    Patient ID: Carol Hardin, female    DOB: 1932-11-13, 80 y.o.   MRN: [id number]  HPI Comments: "I may have stumped this toe"  Patient c/o aching 3rd toe right for a few weeks. The toe is swollen at the knuckle. She remembers stumping it. Hurts mostly with walking a lot.     Review of Systems  HENT: Positive for tinnitus.   Eyes: Positive for visual disturbance.  Gastrointestinal: Positive for abdominal pain.  Skin: Positive for rash.  Allergic/Immunologic: Positive for environmental allergies.  Psychiatric/Behavioral: The patient is nervous/anxious.   All other systems reviewed and are negative.      Objective:   Physical Exam        Assessment & Plan:

## 2014-06-12 ENCOUNTER — Other Ambulatory Visit: Payer: Self-pay | Admitting: Cardiology

## 2014-07-22 ENCOUNTER — Ambulatory Visit: Payer: Medicare Other | Admitting: Cardiology

## 2014-07-24 ENCOUNTER — Encounter: Payer: Self-pay | Admitting: Cardiology

## 2014-09-24 ENCOUNTER — Ambulatory Visit (INDEPENDENT_AMBULATORY_CARE_PROVIDER_SITE_OTHER): Payer: Medicare Other | Admitting: Cardiology

## 2014-09-24 VITALS — BP 110/76 | HR 50 | Ht 65.0 in | Wt 160.0 lb

## 2014-09-24 DIAGNOSIS — I341 Nonrheumatic mitral (valve) prolapse: Secondary | ICD-10-CM

## 2014-09-24 DIAGNOSIS — I959 Hypotension, unspecified: Secondary | ICD-10-CM | POA: Insufficient documentation

## 2014-09-24 DIAGNOSIS — I9589 Other hypotension: Secondary | ICD-10-CM

## 2014-09-24 DIAGNOSIS — Z8659 Personal history of other mental and behavioral disorders: Secondary | ICD-10-CM

## 2014-09-24 DIAGNOSIS — R002 Palpitations: Secondary | ICD-10-CM

## 2014-09-24 NOTE — Assessment & Plan Note (Signed)
Currently stable on Lexapro

## 2014-09-24 NOTE — Assessment & Plan Note (Signed)
Her blood pressure tends to run on the low side.  She is however tolerating a half tablet of bisoprolol daily.  She has not been having any severe dizzy spells or syncope.  She does complain of easy fatigue

## 2014-09-24 NOTE — Patient Instructions (Signed)
Your physician recommends that you continue on your current medications as directed. Please refer to the Current Medication list given to you today.  Your physician wants you to follow-up in: 6 MONTH OV  You will receive a reminder letter in the mail two months in advance. If you don't receive a letter, please call our office to schedule the follow-up appointment.  

## 2014-09-24 NOTE — Assessment & Plan Note (Signed)
No recent severe palpitations.  No recent chest pain.

## 2014-09-24 NOTE — Progress Notes (Signed)
Carol Hardin Date of Birth:  07-Jan-1933 [address] [place], [place]  [address] [phone]  Fax   [phone]  HPI: This pleasant 80 year old woman is seen for a scheduled followup office visit.  She has a past history of mitral valve prolapse.  Her last echocardiogram was in 2002 and showed an ejection fraction of 60-65% and moderate mitral valve prolapse.  She does not have any history of ischemic heart disease.  She does have a history of chronic depression and previously was followed by Dr.Kaur.  Presently she does not have a psychiatrist  She states that she no longer is seeing her.  Dr. Nehemiah Hardin was her internist before he retired. Since last visit she feels that the switch back to bisoprolol from Bystolic has been helpful.  She takes the bisoprolol in the evening.  She states that the cost of the bisoprolol recently went up but she will continue to buy it because it has helped her. Current Outpatient Prescriptions  Medication Sig Dispense Refill  . bisoprolol (ZEBETA) 5 MG tablet TAKE A HALF TABLET BY MOUTH DAILY OR AS DIRECTED 45 tablet 1  . Calcium Citrate (CITRACAL PO) Take 1 capsule by mouth daily.     Marland Kitchen escitalopram (LEXAPRO) 10 MG tablet Take 10 mg by mouth daily.    Marland Kitchen LORazepam (ATIVAN) 1 MG tablet Take 1 mg by mouth at bedtime.      No current facility-administered medications for this visit.    Allergies  Allergen Reactions  . Paroxetine Hcl Other (See Comments)    Headaches (a long time ago- pt doesn't really remember)  . Toprol Xl [Metoprolol Succinate] Other (See Comments)    Dizziness--pt doesn't really remember   . Vancomycin Other (See Comments)    Pt reports that they gave it too fast- she started having itching in the scalp  . Penicillins Rash    Patient Active Problem List   Diagnosis Date Noted  . Low blood pressure 09/24/2014  . MVP (mitral valve prolapse)   . Palpitations   . Hypercholesterolemia   . History of anxiety    . History of depression   . Diverticulosis   . Scoliosis   . Scoliosis   . Osteoporosis   . BRONCHITIS, ACUTE WITH BRONCHOSPASM 06/10/2008  . DYSPNEA 05/31/2008  . ALLERGIC RHINITIS 05/15/2008  . COUGH 05/09/2008    History  Smoking status  . Former Smoker  Smokeless tobacco  . Not on file    History  Alcohol Use No    Family History  Problem Relation Age of Onset  . Heart failure Mother   . Leukemia Mother   . Heart failure Father     Review of Systems: The patient denies any heat or cold intolerance.  No weight gain or weight loss.  The patient denies headaches or blurry vision.  There is no cough or sputum production.  The patient denies dizziness.  There is no hematuria or hematochezia.  The patient denies any muscle aches or arthritis.  The patient denies any rash.  The patient denies frequent falling or instability.  There is no history of depression or anxiety.  All other systems were reviewed and are negative.   Physical Exam: Filed Vitals:   09/24/14 1106  BP: 110/76  Pulse:    the general appearance reveals a well-developed somewhat anxious elderly woman in no distress.The head and neck exam reveals pupils equal and reactive.  Extraocular movements are full.  There  is no scleral icterus.  The mouth and pharynx are normal.  The neck is supple.  The carotids reveal no bruits.  The jugular venous pressure is normal.  The  thyroid is not enlarged.  There is no lymphadenopathy.  The chest is clear to percussion and auscultation.  There are no rales or rhonchi.  Expansion of the chest is symmetrical.  The precordium is quiet.  The first heart sound is normal.  The second heart sound is physiologically split.  There is a apical midsystolic click followed by a soft apical systolic murmur.  Mitral valve click and murmur are much more prominent when she stands up.  There is no abnormal lift or heave.  The abdomen is soft and nontender.  The bowel sounds are normal.  The liver  and spleen are not enlarged.  There are no abdominal masses.  There are no abdominal bruits.  Extremities reveal good pedal pulses.  There is no phlebitis or edema.  There is no cyanosis or clubbing.  Strength is normal and symmetrical in all extremities.  There is no lateralizing weakness.  There are no sensory deficits.  The skin is warm and dry.  There is no rash.     Assessment / Plan: Continue same medication.  Recheck in 6 months for followup office visit.  And EKG.

## 2014-10-24 ENCOUNTER — Telehealth: Payer: Self-pay | Admitting: Cardiology

## 2014-10-24 NOTE — Telephone Encounter (Signed)
Left message to call back  

## 2014-10-24 NOTE — Telephone Encounter (Signed)
New Message      Patient would like to know if a certain med  For the ringing in her ear that is OTC is it ok to take it with patients current medication and condition.  Please call back.   Thanks.

## 2014-10-25 NOTE — Telephone Encounter (Signed)
Will forward to  Dr. Brackbill for review 

## 2014-10-25 NOTE — Telephone Encounter (Signed)
Follow up     It is OK for pt to take lipo-flavonoid for ringing in the ears.

## 2014-10-26 NOTE — Telephone Encounter (Signed)
Ok to take lipo-flavinoid

## 2014-10-29 NOTE — Telephone Encounter (Signed)
Advised patient

## 2015-02-02 ENCOUNTER — Other Ambulatory Visit: Payer: Self-pay | Admitting: Cardiology

## 2015-03-18 ENCOUNTER — Other Ambulatory Visit: Payer: Self-pay | Admitting: Otolaryngology

## 2015-03-18 DIAGNOSIS — R51 Headache: Principal | ICD-10-CM

## 2015-03-18 DIAGNOSIS — R519 Headache, unspecified: Secondary | ICD-10-CM

## 2015-03-26 ENCOUNTER — Emergency Department (HOSPITAL_COMMUNITY)
Admission: EM | Admit: 2015-03-26 | Discharge: 2015-03-26 | Disposition: A | Discharge status: Home / Self Care | Payer: Medicare Other | Attending: Emergency Medicine | Admitting: Emergency Medicine

## 2015-03-26 ENCOUNTER — Emergency Department (HOSPITAL_COMMUNITY): Payer: Medicare Other

## 2015-03-26 ENCOUNTER — Encounter (HOSPITAL_COMMUNITY): Payer: Self-pay | Admitting: Emergency Medicine

## 2015-03-26 DIAGNOSIS — Y998 Other external cause status: Secondary | ICD-10-CM | POA: Insufficient documentation

## 2015-03-26 DIAGNOSIS — F419 Anxiety disorder, unspecified: Secondary | ICD-10-CM | POA: Insufficient documentation

## 2015-03-26 DIAGNOSIS — Z79899 Other long term (current) drug therapy: Secondary | ICD-10-CM | POA: Insufficient documentation

## 2015-03-26 DIAGNOSIS — Y929 Unspecified place or not applicable: Secondary | ICD-10-CM | POA: Diagnosis not present

## 2015-03-26 DIAGNOSIS — M6530 Trigger finger, unspecified finger: Secondary | ICD-10-CM

## 2015-03-26 DIAGNOSIS — Z8639 Personal history of other endocrine, nutritional and metabolic disease: Secondary | ICD-10-CM | POA: Insufficient documentation

## 2015-03-26 DIAGNOSIS — M419 Scoliosis, unspecified: Secondary | ICD-10-CM | POA: Insufficient documentation

## 2015-03-26 DIAGNOSIS — F329 Major depressive disorder, single episode, unspecified: Secondary | ICD-10-CM | POA: Diagnosis not present

## 2015-03-26 DIAGNOSIS — X58XXXA Exposure to other specified factors, initial encounter: Secondary | ICD-10-CM | POA: Insufficient documentation

## 2015-03-26 DIAGNOSIS — Y9389 Activity, other specified: Secondary | ICD-10-CM | POA: Diagnosis not present

## 2015-03-26 DIAGNOSIS — Z8719 Personal history of other diseases of the digestive system: Secondary | ICD-10-CM | POA: Diagnosis not present

## 2015-03-26 DIAGNOSIS — Z8679 Personal history of other diseases of the circulatory system: Secondary | ICD-10-CM | POA: Diagnosis not present

## 2015-03-26 DIAGNOSIS — S6992XA Unspecified injury of left wrist, hand and finger(s), initial encounter: Secondary | ICD-10-CM | POA: Diagnosis present

## 2015-03-26 DIAGNOSIS — Z87891 Personal history of nicotine dependence: Secondary | ICD-10-CM | POA: Insufficient documentation

## 2015-03-26 DIAGNOSIS — M65342 Trigger finger, left ring finger: Secondary | ICD-10-CM | POA: Diagnosis not present

## 2015-03-26 DIAGNOSIS — Z880 Allergy status to penicillin: Secondary | ICD-10-CM | POA: Diagnosis not present

## 2015-03-26 MED ORDER — NAPROXEN 500 MG PO TABS: 500.0000 mg | ORAL_TABLET | Freq: Two times a day (BID) | ORAL | Status: DC

## 2015-03-26 NOTE — Discharge Instructions (Signed)
Please read and follow all provided instructions.  Your diagnoses today include:  1. Trigger finger of left hand     Tests performed today include:  An x-ray of the affected area - does NOT show any broken bones  Vital signs. See below for your results today.   Medications prescribed:   Naproxen - anti-inflammatory pain medication  Do not exceed 500mg  naproxen every 12 hours, take with food  You have been prescribed an anti-inflammatory medication or NSAID. Take with food. Take smallest effective dose for the shortest duration needed for your pain. Stop taking if you experience stomach pain or vomiting.   Take any prescribed medications only as directed.  Home care instructions:   Follow any educational materials contained in this packet  Follow R.I.C.E. Protocol:  R - rest your injury   I  - use ice on injury without applying directly to skin  C - compress injury with bandage or splint  E - elevate the injury as much as possible  Follow-up instructions: Please follow-up with the provided orthopedic physician (bone specialist).  Return instructions:   Please return to the Emergency Department if you experience worsening symptoms.   Please return if you have any other emergent concerns.  Additional Information:  Your vital signs today were: BP 117/68 mmHg   Pulse 58   Temp(Src) 98.5 F (36.9 C)   Resp 16   Ht 5\' 5"  (1.651 m)   Wt 160 lb (72.576 kg)   BMI 26.63 kg/m2   SpO2 97% If your blood pressure (BP) was elevated above 135/85 this visit, please have this repeated by your doctor within one month. --------------

## 2015-03-26 NOTE — ED Provider Notes (Signed)
CSN: [id number]     Arrival date & time 03/26/15  1951 History  This chart was scribed for non-physician practitioner, Alecia Lemming, PA-C, working with Ernestina Patches, MD, by Delphia Grates, ED Scribe. This patient was seen in room TR06C/TR06C and the patient's care was started at 8:30 PM.    Chief Complaint  Patient presents with  . Finger Injury    The history is provided by the patient. No language interpreter was used.     HPI Comments: Carol Hardin is a 81 y.o. female who presents to the Emergency Department complaining of a left 4th finger injury that occurred PTA. Patient states she was taking out the trash and reports sudden pain to the left 4th finger after carrying a "very" heavy trash bag. She states the finger subsequently "locked" in a flexed position and is now unable to straighten it. There is associated constant, 5/10 pain and gradually worsening redness to the affected finger. Patient reports history of a similar injury to short digit and states she was treated at a hand clinic. She denies swelling or any other injuries   Past Medical History  Diagnosis Date  . MVP (mitral valve prolapse)   . Palpitations   . Hypercholesterolemia   . History of anxiety   . History of depression   . Diverticulosis     Hx. of  . Scoliosis     mild  . Osteoporosis   . Chronic headaches    Past Surgical History  Procedure Laterality Date  . Other surgical history  04/12/2007    ortopedic,arthroscopic right knee  . Tonsillectomy    . Blepharoplasty    . Bunionectomy    . Breast biopsy     Family History  Problem Relation Age of Onset  . Heart failure Mother   . Leukemia Mother   . Heart failure Father    History  Substance Use Topics  . Smoking status: Former Research scientist (life sciences)  . Smokeless tobacco: Not on file  . Alcohol Use: No   OB History    No data available     Review of Systems  Constitutional: Negative for activity change.  Musculoskeletal: Positive for arthralgias.  Negative for myalgias, back pain, joint swelling and neck pain.  Skin: Negative for color change and wound.  Neurological: Negative for weakness and numbness.    Allergies  Paroxetine hcl; Toprol xl; Vancomycin; and Penicillins  Home Medications   Prior to Admission medications   Medication Sig Start Date End Date Taking? Authorizing Provider  bisoprolol (ZEBETA) 5 MG tablet TAKE 1/2 TABLET BY MOUTH DAILY OR AS DIRECTED 02/03/15   Darlin Coco, MD  Calcium Citrate (CITRACAL PO) Take 1 capsule by mouth daily.     Historical Provider, MD  escitalopram (LEXAPRO) 10 MG tablet Take 10 mg by mouth daily.    Historical Provider, MD  LORazepam (ATIVAN) 1 MG tablet Take 1 mg by mouth at bedtime.     Historical Provider, MD   Triage Vitals: BP 117/68 mmHg  Pulse 58  Temp(Src) 98.5 F (36.9 C)  Resp 16  Ht 5\' 5"  (1.651 m)  Wt 160 lb (72.576 kg)  BMI 26.63 kg/m2  SpO2 97%  Physical Exam  Constitutional: She appears well-developed and well-nourished.  HENT:  Head: Normocephalic and atraumatic.  Eyes: Pupils are equal, round, and reactive to light.  Neck: Normal range of motion. Neck supple.  Cardiovascular: Exam reveals no decreased pulses.   Musculoskeletal: She exhibits tenderness. She exhibits no edema.  Left shoulder: Normal.       Left elbow: Normal.       Left wrist: Normal.       Left hand: She exhibits decreased range of motion. She exhibits no tenderness, no bony tenderness, normal capillary refill, no deformity, no laceration and no swelling. Normal sensation noted.       Hands: Neurological: She is alert. No sensory deficit.  Motor, sensation, and vascular distal to the injury is fully intact.   Skin: Skin is warm and dry.  Psychiatric: She has a normal mood and affect.  Nursing note and vitals reviewed.   ED Course  Procedures (including critical care time)  DIAGNOSTIC STUDIES: Oxygen Saturation is 97% on room air, adeqaute by my interpretation.     COORDINATION OF CARE: At 2036 Discussed treatment plan with patient which includes imaging. Patient agrees.   Labs Review Labs Reviewed - No data to display  Imaging Review Dg Hand Complete Left  03/26/2015   CLINICAL DATA:  Patient felt left ring finger log up wall taking trash out today. Pain. Query trigger finger. Unable to straighten.  EXAM: LEFT HAND - COMPLETE 3+ VIEW  COMPARISON:  None.  FINDINGS: Degenerative changes in the left hand involving the multiple interphalangeal joints, the first metacarpal phalangeal joint, and intercarpal joints. The left fourth finger is fixed in flexion at the proximal interphalangeal joint suggesting ligamentous injury. No evidence of acute fracture or dislocation. Mild subluxation noted at the first metacarpal phalangeal joint, likely degenerative.  IMPRESSION: Degenerative changes in the left hand and wrist. No acute fractures. Left fourth finger is fixed in flexion at the PIP joint suggesting ligamentous injury.   Electronically Signed   By: Lucienne Capers M.D.   On: 03/26/2015 21:24     EKG Interpretation None       Patient seen and examined. X-ray ordered.   Vital signs reviewed and are as follows: BP 110/64 mmHg  Pulse 45  Temp(Src) 98.5 F (36.9 C)  Resp 18  Ht 5\' 5"  (1.651 m)  Wt 160 lb (72.576 kg)  BMI 26.63 kg/m2  SpO2 97%  X-ray shows no fracture or dislocation. Patient to be started on NSAIDs, finger splint. Orthopedic hand follow-up given.  MDM   Final diagnoses:  Trigger finger of left hand   Patient with trigger finger, x-rays otherwise negative. Hand follow-up indicated for further treatment.   I personally performed the services described in this documentation, which was scribed in my presence. The recorded information has been reviewed and is accurate.     Carlisle Cater, PA-C 03/26/15 2144  Ernestina Patches, MD 03/27/15 [phone]

## 2015-03-26 NOTE — ED Notes (Signed)
Pt st's she was taking out the trash tonight and her left ring finger locked.  Pt is unable to straighten left ring finger '

## 2015-03-31 ENCOUNTER — Ambulatory Visit
Admission: RE | Admit: 2015-03-31 | Discharge: 2015-03-31 | Disposition: A | Discharge status: Home / Self Care | Payer: Medicare Other | Source: Ambulatory Visit | Attending: Otolaryngology | Admitting: Otolaryngology

## 2015-03-31 DIAGNOSIS — G8929 Other chronic pain: Secondary | ICD-10-CM

## 2015-03-31 DIAGNOSIS — R51 Headache: Principal | ICD-10-CM

## 2015-03-31 MED ORDER — GADOBENATE DIMEGLUMINE 529 MG/ML IV SOLN
15.0000 mL | Freq: Once | INTRAVENOUS | Status: AC | PRN
  Administered 2015-03-31: 15 mL via INTRAVENOUS

## 2015-04-08 ENCOUNTER — Ambulatory Visit (INDEPENDENT_AMBULATORY_CARE_PROVIDER_SITE_OTHER): Payer: Medicare Other | Admitting: Cardiology

## 2015-04-08 ENCOUNTER — Encounter: Payer: Self-pay | Admitting: Cardiology

## 2015-04-08 VITALS — BP 104/68 | HR 59 | Ht 65.0 in | Wt 160.4 lb

## 2015-04-08 DIAGNOSIS — I493 Ventricular premature depolarization: Secondary | ICD-10-CM

## 2015-04-08 DIAGNOSIS — Z8659 Personal history of other mental and behavioral disorders: Secondary | ICD-10-CM

## 2015-04-08 DIAGNOSIS — I341 Nonrheumatic mitral (valve) prolapse: Secondary | ICD-10-CM

## 2015-04-08 DIAGNOSIS — R002 Palpitations: Secondary | ICD-10-CM | POA: Diagnosis not present

## 2015-04-08 NOTE — Patient Instructions (Signed)
Medication Instructions:  Your physician recommends that you continue on your current medications as directed. Please refer to the Current Medication list given to you today.  Labwork: none  Testing/Procedures: none  Follow-Up: Your physician wants you to follow-up in: 6 month ov/ekg  You will receive a reminder letter in the mail two months in advance. If you don't receive a letter, please call our office to schedule the follow-up appointment.     

## 2015-04-08 NOTE — Progress Notes (Signed)
Cardiology Office Note   Date:  04/08/2015   ID:  Carol Hardin, DOB 24-Dec-1932, MRN [id number]  PCP:  Dorian Heckle, MD  Cardiologist: Darlin Coco MD  No chief complaint on file.     History of Present Illness: Carol Hardin is a 81 y.o. female who presents for scheduled follow-up  . She has a past history of mitral valve prolapse. Her last echocardiogram was in 2002 and showed an ejection fraction of 60-65% and moderate mitral valve prolapse. She does not have any history of ischemic heart disease. She does have a history of chronic depression and previously was followed by Dr.Kaur. Presently she does not have a psychiatrist She states that she no longer is seeing her. Dr. Nehemiah Settle was her internist before he retired. Since last visit she feels that the switch back to bisoprolol from Bystolic has been helpful. She takes the bisoprolol in the evening. She states that the cost of the bisoprolol recently went up but she will continue to buy it because it has helped her.  she has been having recent headaches.  She has seen Dr. Izell Gilead Key who sent her to Dr. Domingo Cocking at the headache clinic.  She had an MRI of the brain and was told that the MRI showed no significant abnormality except that she had some aging of her brain. She has had a problem with chronic tinnitus as well as migraine variants.  Past Medical History  Diagnosis Date  . MVP (mitral valve prolapse)   . Palpitations   . Hypercholesterolemia   . History of anxiety   . History of depression   . Diverticulosis     Hx. of  . Scoliosis     mild  . Osteoporosis   . Chronic headaches     Past Surgical History  Procedure Laterality Date  . Other surgical history  04/12/2007    ortopedic,arthroscopic right knee  . Tonsillectomy    . Blepharoplasty    . Bunionectomy    . Breast biopsy       Current Outpatient Prescriptions  Medication Sig Dispense Refill  . bisoprolol (ZEBETA) 5 MG  tablet TAKE 1/2 TABLET BY MOUTH DAILY OR AS DIRECTED (Patient taking differently: TAKE 1/2 TABLET BY MOUTH DAILY) 45 tablet 1  . Calcium Citrate (CITRACAL PO) Take 1 capsule by mouth daily.     Marland Kitchen escitalopram (LEXAPRO) 20 MG tablet Take 20 mg by mouth daily.  3  . ipratropium (ATROVENT) 0.06 % nasal spray Place 1-2 sprays into the nose 3 (three) times daily.  2  . LORazepam (ATIVAN) 1 MG tablet Take 1 mg by mouth at bedtime.      No current facility-administered medications for this visit.    Allergies:   Paroxetine hcl; Toprol xl; Vancomycin; and Penicillins    Social History:  The patient  reports that she has quit smoking. She does not have any smokeless tobacco history on file. She reports that she does not drink alcohol or use illicit drugs.   Family History:  The patient's family history includes Heart failure in her father and mother; Leukemia in her mother.    ROS:  Please see the history of present illness.   Otherwise, review of systems are positive for none.   All other systems are reviewed and negative.    PHYSICAL EXAM: VS:  BP 104/68 mmHg  Pulse 59  Ht 5\' 5"  (1.651 m)  Wt 160 lb 6.4 oz (72.757 kg)  BMI 26.69  kg/m2 , BMI Body mass index is 26.69 kg/(m^2). GEN: Well nourished, well developed, in no acute distress HEENT: normal Neck: no JVD, carotid bruits, or masses Cardiac: Normal sinus rhythm with PVCs.  Grade 2/6 late systolic murmur preceded by midsystolic click Respiratory:  clear to auscultation bilaterally, normal work of breathing GI: soft, nontender, nondistended, + BS MS: no deformity or atrophy Skin: warm and dry, no rash Neuro:  Strength and sensation are intact Psych: euthymic mood, full affect   EKG:  EKG is not ordered today. The ekg ordered today demonstrates EKG done at headache Center shows sinus bradycardia and left axis deviation.  Since previous tracing of 01/22/14, no significant change   Recent Labs: No results found for requested labs  within last 365 days.    Lipid Panel No results found for: CHOL, TRIG, HDL, CHOLHDL, VLDL, LDLCALC, LDLDIRECT    Wt Readings from Last 3 Encounters:  04/08/15 160 lb 6.4 oz (72.757 kg)  03/26/15 160 lb (72.576 kg)  09/24/14 160 lb (72.576 kg)        ASSESSMENT AND PLAN:  1. mitral valve prolapse.   2.  Chronic depression  3.  Chronic variant migraine headaches  4.  Chronic tinnitus followed by Dr. Erik Obey   Current medicines are reviewed at length with the patient today.  The patient does not have concerns regarding medicines.  The following changes have been made:  no change  Labs/ tests ordered today include:  No orders of the defined types were placed in this encounter.     Continue current medication.  Recheck in 6 months for office visit and EKG  Signed, Darlin Coco MD 04/08/2015 6:46 PM    [address] [address], [place], [place]  [address] Phone: [phone]; Fax: [phone]

## 2015-07-21 ENCOUNTER — Other Ambulatory Visit: Payer: Self-pay | Admitting: Cardiology

## 2015-07-29 ENCOUNTER — Telehealth: Payer: Self-pay

## 2015-07-29 NOTE — Telephone Encounter (Signed)
Advised patient, verbalized understanding  

## 2015-07-29 NOTE — Telephone Encounter (Signed)
Okay to take lipoflavanoid.  I would avoid decongestants since they can increase BP and arrhythmias

## 2015-07-29 NOTE — Telephone Encounter (Signed)
Patient called in asking if she can safely take OTC Lipo-flavonoid, an inner ear health formula for Tennitis and also Vicks Sinus Severe nasal decongestant.  She is concerned that it would not be good with her cardiac medications.  Please advise me or call pt.  at [phone].  Thank you

## 2015-10-17 ENCOUNTER — Ambulatory Visit (INDEPENDENT_AMBULATORY_CARE_PROVIDER_SITE_OTHER): Payer: Medicare Other | Admitting: Cardiology

## 2015-10-17 ENCOUNTER — Encounter: Payer: Self-pay | Admitting: Cardiology

## 2015-10-17 ENCOUNTER — Other Ambulatory Visit: Payer: Self-pay | Admitting: Cardiology

## 2015-10-17 VITALS — BP 112/68 | HR 54 | Ht 65.0 in | Wt 163.0 lb

## 2015-10-17 DIAGNOSIS — I493 Ventricular premature depolarization: Secondary | ICD-10-CM

## 2015-10-17 DIAGNOSIS — I341 Nonrheumatic mitral (valve) prolapse: Secondary | ICD-10-CM | POA: Diagnosis not present

## 2015-10-17 DIAGNOSIS — R002 Palpitations: Secondary | ICD-10-CM

## 2015-10-17 NOTE — Progress Notes (Signed)
Cardiology Office Note   Date:  10/17/2015   ID:  KAMBELL ZINDA, DOB 1932-11-09, MRN [id number]  PCP:  Lottie Dawson, MD  Cardiologist: Darlin Coco MD  Chief Complaint  Patient presents with  . Mitral Valve Prolapse    c/o chest pain/pain in left breast. Patient denies shortness of breath and LE edema      History of Present Illness: MILES GADE is a 82 y.o. female who presents for follow-up chest pain evaluation.  . She has a past history of mitral valve prolapse. . She does not have any history of ischemic heart disease. She does have a history of chronic depression and previously was followed by Dr.Kaur. Presently she does not have a psychiatrist She states that she no longer is seeing her. Dr. Nehemiah Settle was her internist before he retired. Since last visit she feels that the switch back to bisoprolol from Bystolic has been helpful. She takes the bisoprolol in the evening. She states that the cost of the bisoprolol recently went up but she will continue to buy it because it has helped her. she has been having recent headaches. She has seen Dr. Dareen Piano who sent her to Dr. Domingo Cocking at the headache clinic. She had an MRI of the brain and was told that the MRI showed no significant abnormality except that she had some aging of her brain. She has had a problem with chronic tinnitus as well as migraine variants. Recently she has been experiencing some left-sided chest discomfort.  She thought the pain was coming from her left breast.  She has had 2 mammograms this year which have not shown any abnormality.  Her last echocardiogram was 06/24/10 and showed moderate mitral valve prolapse and moderate mitral regurgitation.   Past Medical History  Diagnosis Date  . MVP (mitral valve prolapse)   . Palpitations   . Hypercholesterolemia   . History of anxiety   . History of depression   . Diverticulosis     Hx. of  . Scoliosis     mild  .  Osteoporosis   . Chronic headaches     Past Surgical History  Procedure Laterality Date  . Other surgical history  04/12/2007    ortopedic,arthroscopic right knee  . Tonsillectomy    . Blepharoplasty    . Bunionectomy    . Breast biopsy       Current Outpatient Prescriptions  Medication Sig Dispense Refill  . bisoprolol (ZEBETA) 5 MG tablet TAKE 1/2 TABLET BY MOUTH DAILY OR AS DIRECTED 45 tablet 1  . escitalopram (LEXAPRO) 20 MG tablet Take 20 mg by mouth daily.  3  . ipratropium (ATROVENT) 0.06 % nasal spray Place 1-2 sprays into the nose 3 (three) times daily.  2  . LORazepam (ATIVAN) 1 MG tablet Take 1 mg by mouth at bedtime.     . Vitamins-Lipotropics (LIPOFLAVONOID PO) Take 2 capsules by mouth 2 (two) times daily.     No current facility-administered medications for this visit.    Allergies:   Paroxetine hcl; Toprol xl; Vancomycin; and Penicillins    Social History:  The patient  reports that she has quit smoking. She does not have any smokeless tobacco history on file. She reports that she does not drink alcohol or use illicit drugs.   Family History:  The patient's family history includes Heart failure in her father and mother; Leukemia in her mother.    ROS:  Please see the history of present illness.  Otherwise, review of systems are positive for none.   All other systems are reviewed and negative.    PHYSICAL EXAM: VS:  BP 112/68 mmHg  Pulse 54  Ht 5\' 5"  (1.651 m)  Wt 163 lb (73.936 kg)  BMI 27.12 kg/m2 , BMI Body mass index is 27.12 kg/(m^2). GEN: Well nourished, well developed, in no acute distress HEENT: normal Neck: no JVD, carotid bruits, or masses Cardiac: RRR there are occasional PVCs.  There is a soft midsystolic click with grade 2/6 late systolic apical murmur consistent with mitral valve prolapse. Respiratory:  clear to auscultation bilaterally, normal work of breathing GI: soft, nontender, nondistended, + BS MS: no deformity or atrophy Skin: warm  and dry, no rash Neuro:  Strength and sensation are intact Psych: euthymic mood, full affect   EKG:  EKG is ordered today. The ekg ordered today demonstrates sinus bradycardia at 55 bpm with occasional PVC.  There is left axis deviation.  Since 04/02/15, PVCs are new   Recent Labs: No results found for requested labs within last 365 days.    Lipid Panel No results found for: CHOL, TRIG, HDL, CHOLHDL, VLDL, LDLCALC, LDLDIRECT    Wt Readings from Last 3 Encounters:  10/17/15 163 lb (73.936 kg)  04/08/15 160 lb 6.4 oz (72.757 kg)  03/26/15 160 lb (72.576 kg)      ASSESSMENT AND PLAN:  1. mitral valve prolapse.  I think that this is the cause of her intermittent left-sided chest pain.  Continue beta blocker in the form of bisoprolol  2. Chronic depression  3. Chronic variant migraine headaches  4. Chronic tinnitus followed by Dr. Erik Obey   Current medicines are reviewed at length with the patient today.  The patient does not have concerns regarding medicines.  The following changes have been made:  no change  Labs/ tests ordered today include:   Orders Placed This Encounter  Procedures  . EKG 12-Lead    Disposition: Continue current medication.  Continue regular walking exercise.  Recheck in 6 months for follow-up office visit with Dr. Meda Coffee.   Berna Spare MD 10/17/2015 5:09 PM    [facility] [address], [place], [place]  [address] Phone: [phone]; Fax: [phone]

## 2015-10-17 NOTE — Patient Instructions (Addendum)
Medication Instructions:  Your physician recommends that you continue on your current medications as directed. Please refer to the Current Medication list given to you today.  Labwork: NONE  Testing/Procedures: NONE  Follow-Up: Your physician wants you to follow-up in: [place] will receive a reminder letter in the mail two months in advance. If you don't receive a letter, please call our office to schedule the follow-up appointment.  If you need a refill on your cardiac medications before your next appointment, please call your pharmacy.

## 2016-02-06 ENCOUNTER — Ambulatory Visit (INDEPENDENT_AMBULATORY_CARE_PROVIDER_SITE_OTHER): Payer: Medicare Other | Admitting: Podiatry

## 2016-02-06 ENCOUNTER — Encounter: Payer: Self-pay | Admitting: Podiatry

## 2016-02-06 DIAGNOSIS — L600 Ingrowing nail: Secondary | ICD-10-CM | POA: Diagnosis not present

## 2016-02-06 DIAGNOSIS — L84 Corns and callosities: Secondary | ICD-10-CM | POA: Diagnosis not present

## 2016-02-06 NOTE — Patient Instructions (Signed)

## 2016-02-09 NOTE — Progress Notes (Signed)
Subjective:     Patient ID: Carol Hardin, female   DOB: 01-04-1933, 82 y.o.   MRN: [id number]  HPI patient presents stating I have a lot of irritation on the second toe my right foot and I'm not sure which ingrown toenail or corn callus formation   Review of Systems     Objective:   Physical Exam Neurovascular status found to be intact muscle strength adequate range of motion within normal limits with patient found to have keratotic lesion on the inside of the second digit right that's painful when pressed and pressure between the hallux and second toe with mild structural abnormality in the right hallux    Assessment:     Compression between hallux and second toe with keratotic lesion and pain    Plan:     H&P condition and x-ray reviewed with patient. Today debridement accomplished with no iatrogenic bleeding advised on padding therapy which was accomplished today with cushioning and patient be seen back as needed and may ultimately require surgery

## 2016-02-20 ENCOUNTER — Telehealth: Payer: Self-pay | Admitting: *Deleted

## 2016-02-20 NOTE — Telephone Encounter (Signed)
Called patient at [phone] (Home #) to check to see how they were doing from when they got their corns treated. Pt stated, "Feeling okay and using toe pads that she got from our office". Pt appreciated [place] calling to check in on her.

## 2016-04-22 ENCOUNTER — Ambulatory Visit (INDEPENDENT_AMBULATORY_CARE_PROVIDER_SITE_OTHER): Payer: Medicare Other | Admitting: Cardiology

## 2016-04-22 ENCOUNTER — Encounter (INDEPENDENT_AMBULATORY_CARE_PROVIDER_SITE_OTHER): Payer: Self-pay

## 2016-04-22 ENCOUNTER — Encounter: Payer: Self-pay | Admitting: Cardiology

## 2016-04-22 VITALS — BP 122/52 | HR 57 | Ht 65.0 in | Wt 163.0 lb

## 2016-04-22 DIAGNOSIS — G43001 Migraine without aura, not intractable, with status migrainosus: Secondary | ICD-10-CM

## 2016-04-22 DIAGNOSIS — E782 Mixed hyperlipidemia: Secondary | ICD-10-CM | POA: Insufficient documentation

## 2016-04-22 DIAGNOSIS — K5732 Diverticulitis of large intestine without perforation or abscess without bleeding: Secondary | ICD-10-CM | POA: Insufficient documentation

## 2016-04-22 DIAGNOSIS — F411 Generalized anxiety disorder: Secondary | ICD-10-CM | POA: Insufficient documentation

## 2016-04-22 DIAGNOSIS — K589 Irritable bowel syndrome without diarrhea: Secondary | ICD-10-CM | POA: Insufficient documentation

## 2016-04-22 DIAGNOSIS — M8580 Other specified disorders of bone density and structure, unspecified site: Secondary | ICD-10-CM | POA: Insufficient documentation

## 2016-04-22 DIAGNOSIS — I341 Nonrheumatic mitral (valve) prolapse: Secondary | ICD-10-CM | POA: Diagnosis not present

## 2016-04-22 DIAGNOSIS — E785 Hyperlipidemia, unspecified: Secondary | ICD-10-CM

## 2016-04-22 DIAGNOSIS — K5730 Diverticulosis of large intestine without perforation or abscess without bleeding: Secondary | ICD-10-CM | POA: Insufficient documentation

## 2016-04-22 NOTE — Progress Notes (Signed)
Cardiology Office Note    Date:  04/22/2016   ID:  [name], DOB 1933-01-02, MRN [id number]  PCP:  Carol Shelling, MD  Cardiologist:   Ena Dawley, MD   Chief complain: Mitral prolapsed mitral regurgitation transferring care from Dr. Mare Hardin to Dr. Meda Coffee.  History of Present Illness:  Carol Hardin is a 82 y.o. female was been followed by Dr. Allene Dillon for years for history of mitral valve prolapse. She does not have any history of ischemic heart disease. She does have a history of chronic depression and previously was followed by Dr.Kaur. Presently she does not have a psychiatrist She states that she no longer is seeing her. Dr. Nehemiah Settle was her internist before he retired. Since last visit she feels that the switch back to bisoprolol from Bystolic has been helpful. She takes the bisoprolol in the evening.She had an MRI of the brain and was told that the MRI showed no significant abnormality except that she had some aging of her brain.She has had a problem with chronic tinnitus as well as migraine variants. Recently she has been experiencing some left-sided chest discomfort. She thought the pain was coming from her left breast. She has had 2 mammograms this year which have not shown any abnormality.   04/22/2016, the patient is coming after 6 months, in the last 6 months she hasn't noticed any change in regards to chest pain or shortness of breath. On she had mitral prolapse with moderate mitral regurgitation on her most recent echo in 2011. She hasn't had any change in her symptoms since then. She still able to do all activities of daily living with no significant limitations. She denies any palpitations or syncope. She is compliant with her meds.  Past Medical History  Diagnosis Date  . MVP (mitral valve prolapse)   . Palpitations   . Hypercholesterolemia   . History of anxiety   . History of depression   . Diverticulosis     Hx. of  .  Scoliosis     mild  . Osteoporosis   . Chronic headaches    Past Surgical History  Procedure Laterality Date  . Other surgical history  04/12/2007    ortopedic,arthroscopic right knee  . Tonsillectomy    . Blepharoplasty    . Bunionectomy    . Breast biopsy     Current Medications: Outpatient Prescriptions Prior to Visit  Medication Sig Dispense Refill  . bisoprolol (ZEBETA) 5 MG tablet TAKE 1/2 TABLET BY MOUTH DAILY OR AS DIRECTED 45 tablet 1  . escitalopram (LEXAPRO) 20 MG tablet Take 20 mg by mouth daily. Reported on 02/06/2016  3  . LORazepam (ATIVAN) 1 MG tablet Take 1 mg by mouth at bedtime.     Marland Kitchen ipratropium (ATROVENT) 0.06 % nasal spray Place 1-2 sprays into the nose 3 (three) times daily. Reported on 04/22/2016  2  . Vitamins-Lipotropics (LIPOFLAVONOID PO) Take 2 capsules by mouth 2 (two) times daily. Reported on 04/22/2016     No facility-administered medications prior to visit.    Allergies:   Paroxetine hcl; Toprol xl; Vancomycin; and Penicillins   Social History   Social History  . Marital Status: Married    Spouse Name: N/A  . Number of Children: N/A  . Years of Education: N/A   Social History Main Topics  . Smoking status: Former Research scientist (life sciences)  . Smokeless tobacco: None  . Alcohol Use: No  . Drug Use: No  . Sexual Activity: Not Asked  Other Topics Concern  . None   Social History Narrative    Family History:  The patient's family history includes Heart failure in her father and mother; Leukemia in her mother.   ROS:   Please see the history of present illness.    ROS All other systems reviewed and are negative.  PHYSICAL EXAM:   VS:  BP 122/52 mmHg  Pulse 57  Ht 5\' 5"  (1.651 m)  Wt 163 lb (73.936 kg)  BMI 27.12 kg/m2   GEN: Well nourished, well developed, in no acute distress HEENT: normal Neck: no JVD, carotid bruits, or masses Cardiac: RRR; midsystolic click with late systolic murmur, no gallops,no edema  Respiratory:  clear to auscultation  bilaterally, normal work of breathing GI: soft, nontender, nondistended, + BS MS: no deformity or atrophy Skin: warm and dry, no rash Neuro:  Alert and Oriented x 3, Strength and sensation are intact Psych: euthymic mood, full affect  Wt Readings from Last 3 Encounters:  04/22/16 163 lb (73.936 kg)  10/17/15 163 lb (73.936 kg)  04/08/15 160 lb 6.4 oz (72.757 kg)    Studies/Labs Reviewed:   EKG:  EKG is ordered today.  The ekg ordered today demonstrates SB otherwise normal ECG.  Recent Labs: No results found for requested labs within last 365 days.   Lipid Panel No results found for: CHOL, TRIG, HDL, CHOLHDL, VLDL, LDLCALC, LDLDIRECT  Her most recent labs from [facility] performing generated 2017 show normal LFTs, normal electrolytes, normal creatinine, LDL 124, HDL 53, triglycerides 116.  ASSESSMENT:    1. Mitral valve prolapse   2. MVP (mitral valve prolapse)   3. Migraine without aura and with status migrainosus, not intractable   4. HLD (hyperlipidemia)      PLAN:  Her most recent echocardiogram shows mitral prolapse with moderate MR and normal right-sided pressures and normal size of the left atrium. Since she hasn't had any changes in her symptoms I don't singly need to repeat. I would continue by systolic as that agrees most fit her with her migraines. Her hyperlipidemia is followed by her primary care physician, she doesn't have a history of coronary artery disease her LDL is 124 for now I would recommend more strict diet.  Current medicines are reviewed at length with the patient today. The patient does not have concerns regarding medicines.  The following changes have been made: no change  Labs/ tests ordered today include:    Medication Adjustments/Labs and Tests Ordered: Current medicines are reviewed at length with the patient today.  Concerns regarding medicines are outlined above.  Medication changes, Labs and Tests ordered today are listed in the  Patient Instructions below. Patient Instructions  Medication Instructions:  No Changes were made today. Please continue the same medication regimen.   Labwork: No lab work today  Testing/Procedures: No testing procedures.  Follow-Up: Your physician wants you to follow-up in:6 MONTHS WITH DR. Johann Capers will receive a reminder letter in the mail two months in advance. If you don't receive a letter, please call our office to schedule the follow-up appointment.       If you need a refill on your cardiac medications before your next appointment, please call your pharmacy.       Signed, Ena Dawley, MD  04/22/2016 2:18 PM    [facility] [address], [place], [place]  [address] Phone: [phone]; Fax: [phone]

## 2016-04-22 NOTE — Patient Instructions (Signed)
Medication Instructions:  No Changes were made today. Please continue the same medication regimen.   Labwork: No lab work today  Testing/Procedures: No testing procedures.  Follow-Up: Your physician wants you to follow-up in:6 MONTHS WITH DR. Johann Capers will receive a reminder letter in the mail two months in advance. If you don't receive a letter, please call our office to schedule the follow-up appointment.       If you need a refill on your cardiac medications before your next appointment, please call your pharmacy.

## 2016-04-26 ENCOUNTER — Other Ambulatory Visit: Payer: Self-pay

## 2016-04-26 MED ORDER — BISOPROLOL FUMARATE 5 MG PO TABS: ORAL_TABLET | ORAL | Status: DC

## 2016-04-26 NOTE — Telephone Encounter (Signed)
Dorothy Spark, MD at 04/22/2016 12:06 PM  bisoprolol (ZEBETA) 5 MG tabletTAKE 1/2 TABLET BY MOUTH DAILY OR AS DIRECTED Current medicines are reviewed at length with the patient today. The patient does not have concerns regarding medicines.  The following changes have been made: no change Patient Instructions     Medication Instructions:  No Changes were made today. Please continue the same medication regimen.

## 2016-07-06 ENCOUNTER — Telehealth: Payer: Self-pay | Admitting: Cardiology

## 2016-07-06 NOTE — Telephone Encounter (Signed)
Pt calling to report that she was advised by her PCP to transition in taking her Zebeta in the mornings, vs taking it in the evenings.  Pt states the rationale behind this is because her PCP is slowly taking her Lexapro away, and with doing this, this may cause her to have palpitations during the day.  PCP wanted her to then start taking her Zebeta in the mornings, instead of the evenings, to help if the palpitations occur.  PCP wanted Cardiology to advise on how to safely transition her Zebeta from taking it at night to taking it in the morning time.  Went and spoke to our [employer], and she advised that the pt take her regular dose tonight, then her next dose will be tomorrow 9/27 at noon time, then her next dose will be the next day 9/28 at breakfast time, and will be continued at that time thereafter.   Informed the pt of these transition instructions.  Pt verbalized understanding, agrees with this plan, and gracious for all the assistance provided.

## 2016-07-06 NOTE — Telephone Encounter (Signed)
New message     Pt c/o medication issue:  1. Name of Medication: bisotrol   2. How are you currently taking this medication (dosage and times per day)? 2.5mg   1xday  3. Are you having a reaction (difficulty breathing--STAT)? no  4. What is your medication issue?  Pt takes medication at night and she wants to switch from night to day

## 2016-07-26 DIAGNOSIS — R0989 Other specified symptoms and signs involving the circulatory and respiratory systems: Secondary | ICD-10-CM | POA: Insufficient documentation

## 2016-07-26 DIAGNOSIS — R490 Dysphonia: Secondary | ICD-10-CM | POA: Insufficient documentation

## 2016-07-26 DIAGNOSIS — K219 Gastro-esophageal reflux disease without esophagitis: Secondary | ICD-10-CM | POA: Diagnosis present

## 2016-07-26 DIAGNOSIS — R198 Other specified symptoms and signs involving the digestive system and abdomen: Secondary | ICD-10-CM | POA: Insufficient documentation

## 2016-08-09 ENCOUNTER — Emergency Department (HOSPITAL_COMMUNITY): Payer: Medicare Other

## 2016-08-09 ENCOUNTER — Encounter (HOSPITAL_COMMUNITY): Payer: Self-pay

## 2016-08-09 ENCOUNTER — Inpatient Hospital Stay (HOSPITAL_COMMUNITY)
Admission: EM | Admit: 2016-08-09 | Discharge: 2016-08-12 | DRG: 392 | Disposition: A | Discharge status: Home / Self Care | Payer: Medicare Other | Attending: Internal Medicine | Admitting: Internal Medicine

## 2016-08-09 DIAGNOSIS — K5792 Diverticulitis of intestine, part unspecified, without perforation or abscess without bleeding: Secondary | ICD-10-CM | POA: Diagnosis not present

## 2016-08-09 DIAGNOSIS — K5732 Diverticulitis of large intestine without perforation or abscess without bleeding: Secondary | ICD-10-CM | POA: Diagnosis not present

## 2016-08-09 DIAGNOSIS — I341 Nonrheumatic mitral (valve) prolapse: Secondary | ICD-10-CM | POA: Diagnosis present

## 2016-08-09 DIAGNOSIS — F329 Major depressive disorder, single episode, unspecified: Secondary | ICD-10-CM | POA: Diagnosis present

## 2016-08-09 DIAGNOSIS — F411 Generalized anxiety disorder: Secondary | ICD-10-CM | POA: Diagnosis present

## 2016-08-09 DIAGNOSIS — E860 Dehydration: Secondary | ICD-10-CM | POA: Diagnosis present

## 2016-08-09 DIAGNOSIS — Z8659 Personal history of other mental and behavioral disorders: Secondary | ICD-10-CM

## 2016-08-09 DIAGNOSIS — Z881 Allergy status to other antibiotic agents status: Secondary | ICD-10-CM

## 2016-08-09 DIAGNOSIS — K589 Irritable bowel syndrome without diarrhea: Secondary | ICD-10-CM | POA: Diagnosis present

## 2016-08-09 DIAGNOSIS — I9589 Other hypotension: Secondary | ICD-10-CM

## 2016-08-09 DIAGNOSIS — Z880 Allergy status to penicillin: Secondary | ICD-10-CM

## 2016-08-09 DIAGNOSIS — Z8249 Family history of ischemic heart disease and other diseases of the circulatory system: Secondary | ICD-10-CM

## 2016-08-09 DIAGNOSIS — Z888 Allergy status to other drugs, medicaments and biological substances status: Secondary | ICD-10-CM

## 2016-08-09 DIAGNOSIS — I10 Essential (primary) hypertension: Secondary | ICD-10-CM | POA: Diagnosis present

## 2016-08-09 DIAGNOSIS — E782 Mixed hyperlipidemia: Secondary | ICD-10-CM | POA: Diagnosis present

## 2016-08-09 DIAGNOSIS — E876 Hypokalemia: Secondary | ICD-10-CM | POA: Diagnosis present

## 2016-08-09 DIAGNOSIS — E7800 Pure hypercholesterolemia, unspecified: Secondary | ICD-10-CM | POA: Diagnosis present

## 2016-08-09 DIAGNOSIS — K5730 Diverticulosis of large intestine without perforation or abscess without bleeding: Secondary | ICD-10-CM | POA: Diagnosis present

## 2016-08-09 DIAGNOSIS — Z87891 Personal history of nicotine dependence: Secondary | ICD-10-CM

## 2016-08-09 DIAGNOSIS — M810 Age-related osteoporosis without current pathological fracture: Secondary | ICD-10-CM | POA: Diagnosis present

## 2016-08-09 DIAGNOSIS — I959 Hypotension, unspecified: Secondary | ICD-10-CM | POA: Diagnosis present

## 2016-08-09 HISTORY — DX: Depression, unspecified: F32.A

## 2016-08-09 HISTORY — DX: Squamous cell carcinoma of skin of scalp and neck: C44.42

## 2016-08-09 HISTORY — DX: Gastro-esophageal reflux disease without esophagitis: K21.9

## 2016-08-09 HISTORY — DX: Pneumonia, unspecified organism: J18.9

## 2016-08-09 HISTORY — DX: Unspecified osteoarthritis, unspecified site: M19.90

## 2016-08-09 HISTORY — DX: Headache: R51

## 2016-08-09 HISTORY — DX: Migraine, unspecified, not intractable, without status migrainosus: G43.909

## 2016-08-09 HISTORY — DX: Anxiety disorder, unspecified: F41.9

## 2016-08-09 HISTORY — DX: Basal cell carcinoma of skin of right lower limb, including hip: C44.712

## 2016-08-09 HISTORY — DX: Major depressive disorder, single episode, unspecified: F32.9

## 2016-08-09 HISTORY — DX: Headache, unspecified: R51.9

## 2016-08-09 LAB — COMPREHENSIVE METABOLIC PANEL
ALT: 16 U/L (ref 14–54)
ANION GAP: 8 (ref 5–15)
AST: 23 U/L (ref 15–41)
Albumin: 4 g/dL (ref 3.5–5.0)
Alkaline Phosphatase: 46 U/L (ref 38–126)
BUN: 10 mg/dL (ref 6–20)
CALCIUM: 9.4 mg/dL (ref 8.9–10.3)
CO2: 21 mmol/L — AB (ref 22–32)
Chloride: 108 mmol/L (ref 101–111)
Creatinine, Ser: 1 mg/dL (ref 0.44–1.00)
GFR calc Af Amer: >60 mL/min (ref >60)
GFR calc non Af Amer: 52 mL/min — ABNORMAL LOW (ref >60)
GLUCOSE: 121 mg/dL — AB (ref 65–99)
Potassium: 4.1 mmol/L (ref 3.5–5.1)
SODIUM: 137 mmol/L (ref 135–145)
Total Bilirubin: 1.3 mg/dL — ABNORMAL HIGH (ref 0.3–1.2)
Total Protein: 7 g/dL (ref 6.5–8.1)

## 2016-08-09 LAB — CBC WITH DIFFERENTIAL/PLATELET
Basophils Absolute: 0 10*3/uL (ref 0.0–0.1)
Basophils Relative: 0 %
Eosinophils Absolute: 0 10*3/uL (ref 0.0–0.7)
Eosinophils Relative: 0 %
HEMATOCRIT: 41.1 % (ref 36.0–46.0)
Hemoglobin: 13.6 g/dL (ref 12.0–15.0)
LYMPHS ABS: 0.8 10*3/uL (ref 0.7–4.0)
Lymphocytes Relative: 6 %
MCH: 30.9 pg (ref 26.0–34.0)
MCHC: 33.1 g/dL (ref 30.0–36.0)
MCV: 93.4 fL (ref 78.0–100.0)
MONO ABS: 1.4 10*3/uL — AB (ref 0.1–1.0)
MONOS PCT: 11 %
Neutro Abs: 10.3 10*3/uL — ABNORMAL HIGH (ref 1.7–7.7)
Neutrophils Relative %: 83 %
Platelets: 199 10*3/uL (ref 150–400)
RBC: 4.4 MIL/uL (ref 3.87–5.11)
RDW: 13.4 % (ref 11.5–15.5)
WBC: 12.5 10*3/uL — ABNORMAL HIGH (ref 4.0–10.5)

## 2016-08-09 LAB — URINALYSIS, ROUTINE W REFLEX MICROSCOPIC
BILIRUBIN URINE: NEGATIVE
GLUCOSE, UA: NEGATIVE mg/dL
HGB URINE DIPSTICK: NEGATIVE
Ketones, ur: NEGATIVE mg/dL
Leukocytes, UA: NEGATIVE
Nitrite: NEGATIVE
PH: 8 (ref 5.0–8.0)
Protein, ur: NEGATIVE mg/dL

## 2016-08-09 LAB — I-STAT CG4 LACTIC ACID, ED: Lactic Acid, Venous: 1.29 mmol/L (ref 0.5–1.9)

## 2016-08-09 MED ORDER — IOPAMIDOL (ISOVUE-300) INJECTION 61%
INTRAVENOUS | Status: AC
  Administered 2016-08-09: 100 mL
  Filled 2016-08-09: qty 100

## 2016-08-09 MED ORDER — ACETAMINOPHEN 650 MG RE SUPP: 650.0000 mg | Freq: Four times a day (QID) | RECTAL | Status: DC | PRN

## 2016-08-09 MED ORDER — TRAZODONE HCL 50 MG PO TABS
25.0000 mg | ORAL_TABLET | Freq: Every evening | ORAL | Status: DC | PRN
  Administered 2016-08-09 – 2016-08-10 (×2): 25 mg via ORAL
  Filled 2016-08-09 (×2): qty 1

## 2016-08-09 MED ORDER — ENOXAPARIN SODIUM 40 MG/0.4ML ~~LOC~~ SOLN
40.0000 mg | SUBCUTANEOUS | Status: DC
  Administered 2016-08-09 – 2016-08-11 (×3): 40 mg via SUBCUTANEOUS
  Filled 2016-08-09 (×3): qty 0.4

## 2016-08-09 MED ORDER — ACETAMINOPHEN 325 MG PO TABS
650.0000 mg | ORAL_TABLET | Freq: Four times a day (QID) | ORAL | Status: DC | PRN
  Administered 2016-08-09 – 2016-08-10 (×2): 650 mg via ORAL
  Filled 2016-08-09 (×2): qty 2

## 2016-08-09 MED ORDER — FAMOTIDINE IN NACL 20-0.9 MG/50ML-% IV SOLN
20.0000 mg | Freq: Two times a day (BID) | INTRAVENOUS | Status: DC
  Administered 2016-08-09 – 2016-08-12 (×6): 20 mg via INTRAVENOUS
  Filled 2016-08-09 (×7): qty 50

## 2016-08-09 MED ORDER — ONDANSETRON HCL 4 MG PO TABS
4.0000 mg | ORAL_TABLET | Freq: Four times a day (QID) | ORAL | Status: DC | PRN
Start: 2016-08-09 — End: 2016-08-12
  Administered 2016-08-12: 4 mg via ORAL
  Filled 2016-08-09: qty 1

## 2016-08-09 MED ORDER — SENNOSIDES-DOCUSATE SODIUM 8.6-50 MG PO TABS
1.0000 | ORAL_TABLET | Freq: Every evening | ORAL | Status: DC | PRN
  Administered 2016-08-12: 1 via ORAL
  Filled 2016-08-09: qty 1

## 2016-08-09 MED ORDER — SODIUM CHLORIDE 0.9 % IV BOLUS (SEPSIS)
500.0000 mL | Freq: Once | INTRAVENOUS | Status: AC
  Administered 2016-08-09: 500 mL via INTRAVENOUS

## 2016-08-09 MED ORDER — SODIUM CHLORIDE 0.9 % IV BOLUS (SEPSIS): 500.0000 mL | Freq: Once | INTRAVENOUS | Status: DC

## 2016-08-09 MED ORDER — METRONIDAZOLE IN NACL 5-0.79 MG/ML-% IV SOLN
500.0000 mg | Freq: Three times a day (TID) | INTRAVENOUS | Status: DC
  Administered 2016-08-10 – 2016-08-12 (×7): 500 mg via INTRAVENOUS
  Filled 2016-08-09 (×10): qty 100

## 2016-08-09 MED ORDER — LORAZEPAM 1 MG PO TABS
1.0000 mg | ORAL_TABLET | Freq: Every day | ORAL | Status: DC
  Administered 2016-08-09 – 2016-08-11 (×3): 1 mg via ORAL
  Filled 2016-08-09 (×3): qty 1

## 2016-08-09 MED ORDER — BISOPROLOL FUMARATE 5 MG PO TABS
2.5000 mg | ORAL_TABLET | Freq: Every day | ORAL | Status: DC
  Filled 2016-08-09 (×2): qty 1

## 2016-08-09 MED ORDER — LEVOFLOXACIN IN D5W 750 MG/150ML IV SOLN
750.0000 mg | INTRAVENOUS | Status: DC
  Filled 2016-08-09: qty 150

## 2016-08-09 MED ORDER — METRONIDAZOLE IN NACL 5-0.79 MG/ML-% IV SOLN: 500.0000 mg | Freq: Once | INTRAVENOUS | Status: DC

## 2016-08-09 MED ORDER — ESCITALOPRAM OXALATE 20 MG PO TABS
20.0000 mg | ORAL_TABLET | Freq: Every day | ORAL | Status: DC
  Administered 2016-08-09: 20 mg via ORAL
  Filled 2016-08-09 (×3): qty 1

## 2016-08-09 MED ORDER — SODIUM CHLORIDE 0.9 % IV SOLN
INTRAVENOUS | Status: DC
  Administered 2016-08-09 – 2016-08-11 (×2): via INTRAVENOUS

## 2016-08-09 MED ORDER — MAGNESIUM CITRATE PO SOLN
1.0000 | Freq: Once | ORAL | Status: AC | PRN
  Administered 2016-08-11: 1 via ORAL
  Filled 2016-08-09: qty 296

## 2016-08-09 MED ORDER — LEVOFLOXACIN IN D5W 750 MG/150ML IV SOLN
750.0000 mg | INTRAVENOUS | Status: DC
  Administered 2016-08-10 – 2016-08-11 (×2): 750 mg via INTRAVENOUS
  Filled 2016-08-09 (×4): qty 150

## 2016-08-09 MED ORDER — LEVOFLOXACIN IN D5W 750 MG/150ML IV SOLN
750.0000 mg | Freq: Once | INTRAVENOUS | Status: AC
  Administered 2016-08-09: 750 mg via INTRAVENOUS
  Filled 2016-08-09: qty 150

## 2016-08-09 MED ORDER — HYDROCODONE-ACETAMINOPHEN 5-325 MG PO TABS
1.0000 | ORAL_TABLET | ORAL | Status: DC | PRN
  Administered 2016-08-09 – 2016-08-12 (×3): 1 via ORAL
  Filled 2016-08-09 (×2): qty 1
  Filled 2016-08-09: qty 2

## 2016-08-09 MED ORDER — ESCITALOPRAM OXALATE 20 MG PO TABS: 20.0000 mg | ORAL_TABLET | Freq: Every day | ORAL | Status: DC

## 2016-08-09 MED ORDER — BISACODYL 10 MG RE SUPP: 10.0000 mg | Freq: Every day | RECTAL | Status: DC | PRN

## 2016-08-09 MED ORDER — ONDANSETRON HCL 4 MG/2ML IJ SOLN
4.0000 mg | Freq: Four times a day (QID) | INTRAMUSCULAR | Status: DC | PRN
  Administered 2016-08-11: 4 mg via INTRAVENOUS
  Filled 2016-08-09 (×2): qty 2

## 2016-08-09 NOTE — ED Notes (Signed)
EDP at bedside  

## 2016-08-09 NOTE — ED Provider Notes (Signed)
Emergency Department Provider Note   I have reviewed the triage vital signs and the nursing notes.   HISTORY  Chief Complaint Abdominal Pain   HPI Carol Hardin is a 82 y.o. female with PMH of anxiety, depression, HLD, and palpitations presents to the emergency department for evaluation of left lower quadrant abdominal pain. Patient attributes it to a PPI medications started earlier in the month. She was having a globus sensation and was started on this by her PCP thought it may be secondary to GERD. She stopped this medication on the 18th of this month over concern for possible "convulsion." She did not seek care after this episode. Since she has been off this medication the patient has had other vague symptoms such as weakness. Her lower abdominal pain started more recently has associated fever. She notes temperature this morning of 101F. He has had decreased stool output. Denies vomiting or nausea. No dysuria.  Past Medical History:  Diagnosis Date  . Chronic headaches   . Diverticulosis    Hx. of  . History of anxiety   . History of depression   . Hypercholesterolemia   . MVP (mitral valve prolapse)   . Osteoporosis   . Palpitations   . Scoliosis    mild    Patient Active Problem List   Diagnosis Date Noted  . Diverticulosis of colon 04/22/2016  . Diverticulosis of large intestine without perforation or abscess without bleeding 04/22/2016  . Generalized anxiety disorder 04/22/2016  . Irritable bowel syndrome without diarrhea 04/22/2016  . Mitral valve prolapse 04/22/2016  . Mixed hyperlipidemia 04/22/2016  . Osteopenia 04/22/2016  . Nonrheumatic mitral valve disorder 04/22/2016  . Ventricular premature depolarization 04/22/2016  . Low blood pressure 09/24/2014  . MVP (mitral valve prolapse)   . Palpitations   . Hypercholesterolemia   . History of anxiety   . History of depression   . Diverticulosis   . Scoliosis   . Scoliosis   . Osteoporosis   .  BRONCHITIS, ACUTE WITH BRONCHOSPASM 06/10/2008  . DYSPNEA 05/31/2008  . ALLERGIC RHINITIS 05/15/2008  . COUGH 05/09/2008    Past Surgical History:  Procedure Laterality Date  . BLEPHAROPLASTY    . BREAST BIOPSY    . BUNIONECTOMY    . OTHER SURGICAL HISTORY  04/12/2007   ortopedic,arthroscopic right knee  . TONSILLECTOMY      Current Outpatient Rx  . Order #: [id number] Class: Normal  . Order #: [id number] Class: Historical Med  . Order #: [id number] Class: Historical Med    Allergies Aripiprazole; Hylan g-f 20; Other; Paroxetine hcl; Toprol xl [metoprolol succinate]; Vancomycin; and Penicillins  Family History  Problem Relation Age of Onset  . Heart failure Mother   . Leukemia Mother   . Heart failure Father     Social History Social History  Substance Use Topics  . Smoking status: Former Research scientist (life sciences)  . Smokeless tobacco: Not on file  . Alcohol use No    Review of Systems  Constitutional: No fever/chills Eyes: No visual changes. ENT: No sore throat. Cardiovascular: Denies chest pain. Respiratory: Denies shortness of breath. Gastrointestinal: Positive lower abdominal pain.  No nausea, no vomiting.  No diarrhea.  No constipation. Genitourinary: Negative for dysuria. Musculoskeletal: Negative for back pain. Skin: Negative for rash. Neurological: Negative for headaches, focal weakness or numbness.  10-point ROS otherwise negative.  ____________________________________________   PHYSICAL EXAM:  VITAL SIGNS: ED Triage Vitals  Enc Vitals Group     BP 08/09/16 1252 (!) 81/67  Pulse Rate 08/09/16 1252 90     Resp 08/09/16 1252 20     Temp 08/09/16 1252 99.5 F (37.5 C)     Temp Source 08/09/16 1252 Oral     SpO2 08/09/16 1252 96 %     Weight 08/09/16 1253 162 lb (73.5 kg)     Height 08/09/16 1253 5\' 5"  (1.651 m)     Pain Score 08/09/16 1251 8   Constitutional: Alert and oriented. Appears slightly uncomfortable.  Eyes: Conjunctivae are normal.  Head:  Atraumatic. Nose: No congestion/rhinnorhea. Mouth/Throat: Mucous membranes are very dry. Oropharynx non-erythematous. Neck: No stridor.  Cardiovascular: Normal rate, regular rhythm. Good peripheral circulation. Grossly normal heart sounds.   Respiratory: Normal respiratory effort.  No retractions. Lungs are slightly diminished at the bases. No wheezing.  Gastrointestinal: Soft with moderate LLQ abdominal tenderness to palpation. No distention.  Musculoskeletal: No lower extremity tenderness nor edema. No gross deformities of extremities. Neurologic:  Normal speech and language. No gross focal neurologic deficits are appreciated.  Skin:  Skin is warm, dry and intact. No rash noted.  ____________________________________________   LABS (all labs ordered are listed, but only abnormal results are displayed)  Labs Reviewed  COMPREHENSIVE METABOLIC PANEL - Abnormal; Notable for the following:       Result Value   CO2 21 (*)    Glucose, Bld 121 (*)    Total Bilirubin 1.3 (*)    GFR calc non Af Amer 52 (*)    All other components within normal limits  CBC WITH DIFFERENTIAL/PLATELET - Abnormal; Notable for the following:    WBC 12.5 (*)    Neutro Abs 10.3 (*)    Monocytes Absolute 1.4 (*)    All other components within normal limits  CULTURE, BLOOD (ROUTINE X 2)  CULTURE, BLOOD (ROUTINE X 2)  URINE CULTURE  URINALYSIS, ROUTINE W REFLEX MICROSCOPIC (NOT AT [facility])  I-STAT CG4 LACTIC ACID, ED   ____________________________________________  RADIOLOGY  Dg Chest 2 View  Result Date: 08/09/2016 CLINICAL DATA:  Generalized abdominal pain, fever and fatigue for 2 days. EXAM: CHEST  2 VIEW COMPARISON:  Chest radiograph May 09, 2008 FINDINGS: Cardiac silhouette is mildly enlarged unchanged. Tortuous mildly calcified aorta. Increased lung volumes, flattened hemidiaphragms, mild chronic interstitial changes. No pleural effusion or focal consolidation. No pneumothorax. Mild degenerative change of  the thoracic spine. IMPRESSION: COPD.  Mild cardiomegaly.  No acute pulmonary process. Electronically Signed   By: Elon Alas M.D.   On: 08/09/2016 15:06   Ct Abdomen Pelvis W Contrast  Result Date: 08/09/2016 CLINICAL DATA:  83 year old female left lower quadrant pain onset yesterday. Constipation for 2 days. Initial encounter. EXAM: CT ABDOMEN AND PELVIS WITH CONTRAST TECHNIQUE: Multidetector CT imaging of the abdomen and pelvis was performed using the standard protocol following bolus administration of intravenous contrast. CONTRAST:  134mL ISOVUE-300 IOPAMIDOL (ISOVUE-300) INJECTION 61% COMPARISON:  07/16/2014 CT. FINDINGS: Lower chest: Minimal scarring/ atelectasis. Hepatobiliary: No hepatic mass.  No calcified gallstones. Pancreas: No pancreatic mass or inflammation. Spleen: 1 cm nonspecific splenic low-density lesion. It is difficult to state this was present previously as prior exams performed without contrast. Adrenals/Urinary Tract: Bilateral cysts largest measuring up to 2 cm left upper pole. No hydronephrosis. No adrenal lesion. Stomach/Bowel: 5 cm Stellan Vick segment of sigmoid colon with diffuse inflammation which may reflect changes of diverticulitis however, underlying mass not excluded. Pelvic fluid without well-defined drainable abscess. Within the left aspect of pelvic fluid is a 4 cm structure which may represent previously  noted enlarged ovary and will need to be assessed with pelvic sonogram after the acute episode has cleared. Decompressed stomach.  No acute abnormality noted. Vascular/Lymphatic: Aortic calcifications with ectasia. No aneurysm or high-grade stenosis. Calcification common iliac arteries mild to moderate narrowing and slight ectasia. Small pelvic lymph nodes bilaterally. Reproductive: Calcification uterus suggestive of small fibroid. Left ovarian mass may be present as noted above. Other: No free air. Musculoskeletal: Mild scoliosis lumbar spine convex right. IMPRESSION:  Five cm Aditya Nastasi segment of sigmoid colon with diffuse inflammation which may reflect changes of diverticulitis however, underlying mass not excluded. Pelvic fluid without well-defined drainable abscess. Within the left aspect of pelvic fluid is a 4 cm structure which may represent previously noted enlarged ovary and will need to be assessed with pelvic sonogram after the acute episode has cleared. Aortic calcifications with ectasia. Calcification common iliac arteries with mild to moderate narrowing and slight ectasia. 1 cm nonspecific splenic low-density lesion. It is difficult to state this was present previously as prior exams performed without contrast. Electronically Signed   By: Genia Del M.D.   On: 08/09/2016 15:52    ____________________________________________   PROCEDURES  Procedure(s) performed:   Procedures  None ____________________________________________   INITIAL IMPRESSION / ASSESSMENT AND PLAN / ED COURSE  Pertinent labs & imaging results that were available during my care of the patient were reviewed by me and considered in my medical decision making (see chart for details).  Patient resents to the emergency department for evaluation of new onset lower abdominal discomfort and fever. Blood pressure is low on arrival. Patient is alert and appears only moderately uncomfortable. No chest pain or nausea. No dyspnea. With moderate tenderness on exam plan for CT scan abdomen and pelvis is a patient has had episodes of diverticulitis in the past and feels similar. Very low suspicion for ACS equivalent.   04:17 PM CT scan reviewed. With fever and LLQ pain favor diverticulitis over mass. I started IV antibiotics. Given the patient's fever at home, leukocytosis, low blood pressures here in the emergency department a plan for admission management of diverticulitis with IV antibiotics and fluid. Discussed the CT scan, my impression, plan with the patient in detail. She is in  agreement.   Discussed case with hospitalist who will be down to place orders.  ____________________________________________  FINAL CLINICAL IMPRESSION(S) / ED DIAGNOSES  Final diagnoses:  Diverticulitis of intestine without perforation or abscess without bleeding, unspecified part of intestinal tract     MEDICATIONS GIVEN DURING THIS VISIT:  Medications  levofloxacin (LEVAQUIN) IVPB 750 mg (not administered)  metroNIDAZOLE (FLAGYL) IVPB 500 mg (not administered)  sodium chloride 0.9 % bolus 500 mL (not administered)  sodium chloride 0.9 % bolus 500 mL (0 mLs Intravenous Stopped 08/09/16 1606)  iopamidol (ISOVUE-300) 61 % injection (100 mLs  Contrast Given 08/09/16 1510)     NEW OUTPATIENT MEDICATIONS STARTED DURING THIS VISIT:  None   Note:  This document was prepared using Dragon voice recognition software and may include unintentional dictation errors.  Nanda Quinton, MD Emergency Medicine   Margette Fast, MD 08/09/16 [id number]

## 2016-08-09 NOTE — ED Triage Notes (Signed)
Patient here with generalized abdominal pain, fever, fatigue x 2 days. Patient states thinks related to diverticulitis. Nausea without vomiting. Alert and oriented, pale on arrival

## 2016-08-09 NOTE — ED Notes (Signed)
MD at bedside. 

## 2016-08-09 NOTE — ED Notes (Signed)
Patient transported to X-ray 

## 2016-08-09 NOTE — H&P (Signed)
History and Physical    KALIFA CADDEN GGE:[id number] DOB: 06-17-1933 DOA: 08/09/2016   PCP: Irven Shelling, MD   Patient coming from:  Home    Chief Complaint: Lower abdominal pain   HPI: NANCE MCCOMBS is a 82 y.o. female with medical history significant for diverticulosis, presenting with a 2 day history of abdominal pain, nausea without vomiting, mild abdominal distention, decreased stool output with last bowel movement the day before yesterday , fever up to 101, generalized fatigue, and  poor oral intake especially with fluids. She reports similar symptoms when she has a diverticulitis flare several years ago. SHe attributes these symptoms today to eating tomatoes and seeds . Denies any respiratory complaints. Denies any chest pain or palpitations. Denies lower extremity swelling.  Denies any dysuria. Denies abnormal skin rashes, or neuropathy. Denies any bleeding issues such as epistaxis, hematemesis, hematuria or hematochezia. Ambulating without difficulty.   ED Course:  BP 105/69   Pulse 60   Temp 99.5 F (37.5 C) (Oral)   Resp 18   Ht 5\' 5"  (1.651 m)   Wt 73.5 kg (162 lb)   SpO2 95%   BMI 26.96 kg/m   CMP unremarkable with the exception of mild increase in her bilirubin 21.3 in the setting of dehydration. Lactic acid 1.29 white count 12.5 hemoglobin 13.6 platelets 199 glucose 121 blood  And urine culture pending CT of the abdomen and pelvis is diffuse inflammation 5 cm in the sigmoid colon, consistent with diverticulitis, some pelvic fluid. Of note, within the left aspect of the pelvic fluid, there is a 4 cm structure, likely enlarged ovary, seen in prior CT in 2012  Chest x-ray without any acute pulmonary process.  Review of Systems: As per HPI otherwise 10 point review of systems negative.   Past Medical History:  Diagnosis Date  . Chronic headaches   . Diverticulosis    Hx. of  . History of anxiety   . History of depression   . Hypercholesterolemia   .  MVP (mitral valve prolapse)   . Osteoporosis   . Palpitations   . Scoliosis    mild    Past Surgical History:  Procedure Laterality Date  . BLEPHAROPLASTY    . BREAST BIOPSY    . BUNIONECTOMY    . OTHER SURGICAL HISTORY  04/12/2007   ortopedic,arthroscopic right knee  . TONSILLECTOMY      Social History Social History   Social History  . Marital status: Married    Spouse name: N/A  . Number of children: N/A  . Years of education: N/A   Occupational History  . Not on file.   Social History Main Topics  . Smoking status: Former Research scientist (life sciences)  . Smokeless tobacco: Not on file  . Alcohol use No  . Drug use: No  . Sexual activity: Not on file   Other Topics Concern  . Not on file   Social History Narrative  . No narrative on file     Allergies  Allergen Reactions  . Aripiprazole Other (See Comments)  . Hylan G-F 20 Other (See Comments)  . Other Other (See Comments)  . Paroxetine Hcl Other (See Comments)    Headaches (a long time ago- pt doesn't really remember)  . Toprol Xl [Metoprolol Succinate] Other (See Comments)    Dizziness--pt doesn't really remember   . Vancomycin Other (See Comments)    Pt reports that they gave it too fast- she started having itching in the scalp  . Penicillins  Rash    Family History  Problem Relation Age of Onset  . Heart failure Mother   . Leukemia Mother   . Heart failure Father       Prior to Admission medications   Medication Sig Start Date End Date Taking? Authorizing Provider  bisoprolol (ZEBETA) 5 MG tablet TAKE 1/2 TABLET BY MOUTH DAILY OR AS DIRECTED 04/26/16  Yes Dorothy Spark, MD  escitalopram (LEXAPRO) 20 MG tablet Take 20 mg by mouth daily. Reported on 02/06/2016 03/20/15  Yes Historical Provider, MD  LORazepam (ATIVAN) 1 MG tablet Take 1 mg by mouth at bedtime.    Yes Historical Provider, MD    Physical Exam:    Vitals:   08/09/16 1400 08/09/16 1609 08/09/16 1630 08/09/16 1654  BP: 102/68 104/74 113/67 105/69    Pulse: 67 67 66 60  Resp: 13 15  18   Temp:      TempSrc:      SpO2: 93% 94% 97% 95%  Weight:      Height:           Constitutional: NAD, calm, somewhat uncomfortable   Vitals:   08/09/16 1400 08/09/16 1609 08/09/16 1630 08/09/16 1654  BP: 102/68 104/74 113/67 105/69  Pulse: 67 67 66 60  Resp: 13 15  18   Temp:      TempSrc:      SpO2: 93% 94% 97% 95%  Weight:      Height:       Eyes: PERRL, lids and conjunctivae normal ENMT: Mucous membranes are moist. Posterior pharynx clear of any exudate or lesions.Normal dentition.  Neck: normal, supple, no masses, no thyromegaly Respiratory: clear to auscultation bilaterally, no wheezing, no crackles. Normal respiratory effort. No accessory muscle use.  Cardiovascular: Regular rate and rhythm, no murmurs / rubs / gallops. No extremity edema. 2+ pedal pulses. No carotid bruits.  Abdomen: mild LLQ tenderness, no masses palpated. abdomen somewhat distended No hepatosplenomegaly. Bowel sounds positive.  Musculoskeletal: no clubbing / cyanosis. No joint deformity upper and lower extremities. Good ROM, no contractures. Normal muscle tone.  Skin: no rashes, lesions, ulcers.  Neurologic: CN 2-12 grossly intact. Sensation intact, DTR normal. Strength 5/5 in all 4.  Psychiatric: Normal judgment and insight. Alert and oriented x 3. Normal mood.     Labs on Admission: I have personally reviewed following labs and imaging studies  CBC:  Recent Labs Lab 08/09/16 1300  WBC 12.5*  NEUTROABS 10.3*  HGB 13.6  HCT 41.1  MCV 93.4  PLT 973    Basic Metabolic Panel:  Recent Labs Lab 08/09/16 1300  NA 137  K 4.1  CL 108  CO2 21*  GLUCOSE 121*  BUN 10  CREATININE 1.00  CALCIUM 9.4    GFR: Estimated Creatinine Clearance: 45.1 mL/min (by C-G formula based on SCr of 1 mg/dL).  Liver Function Tests:  Recent Labs Lab 08/09/16 1300  AST 23  ALT 16  ALKPHOS 46  BILITOT 1.3*  PROT 7.0  ALBUMIN 4.0   No results for input(s):  LIPASE, AMYLASE in the last 168 hours. No results for input(s): AMMONIA in the last 168 hours.  Coagulation Profile: No results for input(s): INR, PROTIME in the last 168 hours.  Cardiac Enzymes: No results for input(s): CKTOTAL, CKMB, CKMBINDEX, TROPONINI in the last 168 hours.  BNP (last 3 results) No results for input(s): PROBNP in the last 8760 hours.  HbA1C: No results for input(s): HGBA1C in the last 72 hours.  CBG: No results for  input(s): GLUCAP in the last 168 hours.  Lipid Profile: No results for input(s): CHOL, HDL, LDLCALC, TRIG, CHOLHDL, LDLDIRECT in the last 72 hours.  Thyroid Function Tests: No results for input(s): TSH, T4TOTAL, FREET4, T3FREE, THYROIDAB in the last 72 hours.  Anemia Panel: No results for input(s): VITAMINB12, FOLATE, FERRITIN, TIBC, IRON, RETICCTPCT in the last 72 hours.  Urine analysis:    Component Value Date/Time   COLORURINE YELLOW 08/16/2008 2359   APPEARANCEUR CLOUDY (A) 08/16/2008 2359   LABSPEC 1.007 08/16/2008 2359   PHURINE 7.0 08/16/2008 2359   GLUCOSEU NEGATIVE 08/16/2008 2359   HGBUR NEGATIVE 08/16/2008 2359   BILIRUBINUR NEGATIVE 08/16/2008 [place] 08/16/2008 2359   PROTEINUR NEGATIVE 08/16/2008 2359   UROBILINOGEN 0.2 08/16/2008 2359   NITRITE NEGATIVE 08/16/2008 2359   LEUKOCYTESUR TRACE (A) 08/16/2008 2359    Sepsis Labs: @LABRCNTIP (procalcitonin:4,lacticidven:4) )No results found for this or any previous visit (from the past 240 hour(s)).   Radiological Exams on Admission: Dg Chest 2 View  Result Date: 08/09/2016 CLINICAL DATA:  Generalized abdominal pain, fever and fatigue for 2 days. EXAM: CHEST  2 VIEW COMPARISON:  Chest radiograph May 09, 2008 FINDINGS: Cardiac silhouette is mildly enlarged unchanged. Tortuous mildly calcified aorta. Increased lung volumes, flattened hemidiaphragms, mild chronic interstitial changes. No pleural effusion or focal consolidation. No pneumothorax. Mild  degenerative change of the thoracic spine. IMPRESSION: COPD.  Mild cardiomegaly.  No acute pulmonary process. Electronically Signed   By: Elon Alas M.D.   On: 08/09/2016 15:06   Ct Abdomen Pelvis W Contrast  Result Date: 08/09/2016 CLINICAL DATA:  82 year old female left lower quadrant pain onset yesterday. Constipation for 2 days. Initial encounter. EXAM: CT ABDOMEN AND PELVIS WITH CONTRAST TECHNIQUE: Multidetector CT imaging of the abdomen and pelvis was performed using the standard protocol following bolus administration of intravenous contrast. CONTRAST:  157mL ISOVUE-300 IOPAMIDOL (ISOVUE-300) INJECTION 61% COMPARISON:  07/16/2014 CT. FINDINGS: Lower chest: Minimal scarring/ atelectasis. Hepatobiliary: No hepatic mass.  No calcified gallstones. Pancreas: No pancreatic mass or inflammation. Spleen: 1 cm nonspecific splenic low-density lesion. It is difficult to state this was present previously as prior exams performed without contrast. Adrenals/Urinary Tract: Bilateral cysts largest measuring up to 2 cm left upper pole. No hydronephrosis. No adrenal lesion. Stomach/Bowel: 5 cm long segment of sigmoid colon with diffuse inflammation which may reflect changes of diverticulitis however, underlying mass not excluded. Pelvic fluid without well-defined drainable abscess. Within the left aspect of pelvic fluid is a 4 cm structure which may represent previously noted enlarged ovary and will need to be assessed with pelvic sonogram after the acute episode has cleared. Decompressed stomach.  No acute abnormality noted. Vascular/Lymphatic: Aortic calcifications with ectasia. No aneurysm or high-grade stenosis. Calcification common iliac arteries mild to moderate narrowing and slight ectasia. Small pelvic lymph nodes bilaterally. Reproductive: Calcification uterus suggestive of small fibroid. Left ovarian mass may be present as noted above. Other: No free air. Musculoskeletal: Mild scoliosis lumbar spine  convex right. IMPRESSION: Five cm long segment of sigmoid colon with diffuse inflammation which may reflect changes of diverticulitis however, underlying mass not excluded. Pelvic fluid without well-defined drainable abscess. Within the left aspect of pelvic fluid is a 4 cm structure which may represent previously noted enlarged ovary and will need to be assessed with pelvic sonogram after the acute episode has cleared. Aortic calcifications with ectasia. Calcification common iliac arteries with mild to moderate narrowing and slight ectasia. 1 cm nonspecific splenic low-density lesion. It is difficult  to state this was present previously as prior exams performed without contrast. Electronically Signed   By: Genia Del M.D.   On: 08/09/2016 15:52    EKG: Independently reviewed.  Assessment/Plan Active Problems:   Hypercholesterolemia   History of anxiety   History of depression   Diverticulosis   Low blood pressure   Diverticulosis of colon   Diverticulosis of large intestine without perforation or abscess without bleeding   Generalized anxiety disorder   Irritable bowel syndrome without diarrhea   Mixed hyperlipidemia    Acute diverticulitis as evidenced by CT A/P  WBC 12.5. Lactic acid 1.29 Receiving  Flagyl and Levaquin, 1 L IVF . History of diverticulosis, IBS . Blood culture pending  Admit to MedSurg. Clear liquid diet, advance as tolerated  Continue IV hydration with normal saline total 1500 cc, and then at 75 cc/h .  Continue Levaquin and Flagyl  Protonix 40 mg IV daily Analgesics and antiemetics as needed. Consider surgical evaluation if no improvement.   Hypertension BP 104/74   Pulse 67   Continue home anti-hypertensive medications in am  Anxiety Continue home Lorazepam   Depression Continue home  Lexapro   DVT prophylaxis: Lovenox   Code Status:   Full    Family Communication:  Discussed with patient Disposition Plan: Expect patient to be discharged to home after  condition improves, likely in am  Consults called:    None Admission status:  Medsurg   Skyy Nilan E, PA-C [facility]   If 7PM-7AM, please contact night-coverage [email or web address] Password [email or web address]  08/09/2016, 5:27 PM

## 2016-08-09 NOTE — ED Notes (Addendum)
Denies N/V/D Pt placed on monitor. State she takes beta blockers.   Also Reports taking new medication Omeprazole. States "I went to my Dr. For a routine check up and I had been having a lump feeling in my throat. I have family members with hx of throat cancer. He put me on that drug thinking it was reflux. I don't do well with new medications. Then my stomach began hurting.

## 2016-08-09 NOTE — ED Notes (Signed)
1st set of blood cultures in lab

## 2016-08-09 NOTE — ED Notes (Signed)
Admitting team at bedside.

## 2016-08-10 DIAGNOSIS — Z8659 Personal history of other mental and behavioral disorders: Secondary | ICD-10-CM | POA: Diagnosis not present

## 2016-08-10 DIAGNOSIS — E782 Mixed hyperlipidemia: Secondary | ICD-10-CM | POA: Diagnosis present

## 2016-08-10 DIAGNOSIS — E860 Dehydration: Secondary | ICD-10-CM | POA: Diagnosis present

## 2016-08-10 DIAGNOSIS — Z888 Allergy status to other drugs, medicaments and biological substances status: Secondary | ICD-10-CM | POA: Diagnosis not present

## 2016-08-10 DIAGNOSIS — I10 Essential (primary) hypertension: Secondary | ICD-10-CM | POA: Diagnosis present

## 2016-08-10 DIAGNOSIS — Z880 Allergy status to penicillin: Secondary | ICD-10-CM | POA: Diagnosis not present

## 2016-08-10 DIAGNOSIS — K5792 Diverticulitis of intestine, part unspecified, without perforation or abscess without bleeding: Secondary | ICD-10-CM | POA: Diagnosis present

## 2016-08-10 DIAGNOSIS — K589 Irritable bowel syndrome without diarrhea: Secondary | ICD-10-CM | POA: Diagnosis present

## 2016-08-10 DIAGNOSIS — F329 Major depressive disorder, single episode, unspecified: Secondary | ICD-10-CM | POA: Diagnosis present

## 2016-08-10 DIAGNOSIS — F411 Generalized anxiety disorder: Secondary | ICD-10-CM | POA: Diagnosis present

## 2016-08-10 DIAGNOSIS — E876 Hypokalemia: Secondary | ICD-10-CM | POA: Diagnosis present

## 2016-08-10 DIAGNOSIS — K5730 Diverticulosis of large intestine without perforation or abscess without bleeding: Secondary | ICD-10-CM | POA: Diagnosis not present

## 2016-08-10 DIAGNOSIS — Z8249 Family history of ischemic heart disease and other diseases of the circulatory system: Secondary | ICD-10-CM | POA: Diagnosis not present

## 2016-08-10 DIAGNOSIS — Z881 Allergy status to other antibiotic agents status: Secondary | ICD-10-CM | POA: Diagnosis not present

## 2016-08-10 DIAGNOSIS — M810 Age-related osteoporosis without current pathological fracture: Secondary | ICD-10-CM | POA: Diagnosis present

## 2016-08-10 DIAGNOSIS — I341 Nonrheumatic mitral (valve) prolapse: Secondary | ICD-10-CM | POA: Diagnosis present

## 2016-08-10 DIAGNOSIS — Z87891 Personal history of nicotine dependence: Secondary | ICD-10-CM | POA: Diagnosis not present

## 2016-08-10 DIAGNOSIS — K5732 Diverticulitis of large intestine without perforation or abscess without bleeding: Secondary | ICD-10-CM | POA: Diagnosis present

## 2016-08-10 LAB — CBC
HEMATOCRIT: 34.5 % — AB (ref 36.0–46.0)
HEMOGLOBIN: 11.3 g/dL — AB (ref 12.0–15.0)
MCH: 31.1 pg (ref 26.0–34.0)
MCHC: 32.8 g/dL (ref 30.0–36.0)
MCV: 95 fL (ref 78.0–100.0)
Platelets: 148 10*3/uL — ABNORMAL LOW (ref 150–400)
RBC: 3.63 MIL/uL — AB (ref 3.87–5.11)
RDW: 13.7 % (ref 11.5–15.5)
WBC: 11.1 10*3/uL — ABNORMAL HIGH (ref 4.0–10.5)

## 2016-08-10 LAB — COMPREHENSIVE METABOLIC PANEL
ALBUMIN: 3 g/dL — AB (ref 3.5–5.0)
ALK PHOS: 43 U/L (ref 38–126)
ALT: 12 U/L — ABNORMAL LOW (ref 14–54)
ANION GAP: 5 (ref 5–15)
AST: 17 U/L (ref 15–41)
BILIRUBIN TOTAL: 1.3 mg/dL — AB (ref 0.3–1.2)
BUN: 9 mg/dL (ref 6–20)
CALCIUM: 8.4 mg/dL — AB (ref 8.9–10.3)
CO2: 22 mmol/L (ref 22–32)
Chloride: 110 mmol/L (ref 101–111)
Creatinine, Ser: 0.88 mg/dL (ref 0.44–1.00)
GFR calc non Af Amer: >60 mL/min (ref >60)
Glucose, Bld: 115 mg/dL — ABNORMAL HIGH (ref 65–99)
POTASSIUM: 3.9 mmol/L (ref 3.5–5.1)
SODIUM: 137 mmol/L (ref 135–145)
TOTAL PROTEIN: 5.7 g/dL — AB (ref 6.5–8.1)

## 2016-08-10 LAB — URINE CULTURE

## 2016-08-10 MED ORDER — SODIUM CHLORIDE 0.9 % IV BOLUS (SEPSIS)
500.0000 mL | Freq: Once | INTRAVENOUS | Status: AC
  Administered 2016-08-10: 500 mL via INTRAVENOUS

## 2016-08-10 NOTE — Progress Notes (Signed)
Patient is refusing to take antibiotics. Patient states ", I don't feel any better, the antibiotics are too much." RN paged MD to talk to patient and patient's family.

## 2016-08-10 NOTE — Progress Notes (Signed)
Patient called RN to room due to being nauseous.. RN went to give patient nausea medicine and patient refused to take it after RN pulled medication up in a syringe. RN wasted with L. Yong Channel, Therapist, sports.

## 2016-08-10 NOTE — Progress Notes (Signed)
PROGRESS NOTE    Carol Hardin  MGQ:[id number] DOB: 1933-09-29 DOA: 08/09/2016 PCP: Leeroy Cha, MD   Brief Narrative: Carol Hardin is a 82 y.o. female with medical history significant for diverticulosis, presenting with a 2 day history of abdominal pain, nausea without vomiting, mild abdominal distention, decreased stool output with last bowel movement the day before yesterday , fever up to 101, generalized fatigue, and  poor oral intake especially with fluids. She reports similar symptoms when she has a diverticulitis flare several years ago.   CT of the abdomen and pelvis is diffuse inflammation 5 cm in the sigmoid colon, consistent with diverticulitis, some pelvic fluid. Of note, within the left aspect of the pelvic fluid, there is a 4 cm structure, likely enlarged ovary, seen in prior CT in 2012    Assessment & Plan:   Active Problems:   Hypercholesterolemia   History of anxiety   History of depression   Diverticulosis   Low blood pressure   Diverticulosis of colon   Diverticulosis of large intestine without perforation or abscess without bleeding   Generalized anxiety disorder   Irritable bowel syndrome without diarrhea   Mixed hyperlipidemia   Diverticulitis   Diverticulitis of intestine without perforation or abscess without bleeding   Acute diverticulitis as evidenced by CT A/P  WBC 12.5. Lactic acid 1.29 .  Clear liquid diet, advance as tolerated  Continue Levaquin and Flagyl IV  Pepcid IV BID IV fluids. WBC trending down.  SBP soft this morning. IV bolus ordered.  Analgesics and antiemetics as needed. Consider surgical evaluation if no improvement.  4 cm structure which may represent previously noted enlarged ovary : will need to be assessed with pelvic sonogram after the acute episode has cleared. Follow up with PCP. Patient aware of results.   Hypertension BP 104/74   Pulse 67   Holder parameter for bisoprolol/   Anxiety Continue home  Lorazepam   Depression Continue home  Lexapro     DVT prophylaxis: Lovenox Code Status: full code.  Family Communication:care discussed with patient  Disposition Plan: remain inpatient for treatment of diverticulitis, IV fluids.    Consultants:   none   Procedures:  none   Antimicrobials:   Levaquin 10-30  Flagyl 10-30   Subjective: Abdominal pain better. Had small BM this morning. Still with nausea.  Complaining of headaches, chronic.    Objective: Vitals:   08/09/16 1700 08/09/16 1943 08/10/16 0453 08/10/16 0746  BP: 106/62 (!) 107/58 (!) 87/54 (!) 96/58  Pulse: 61 69 (!) 55   Resp: 14 15 15    Temp:  99.2 F (37.3 C) 98.5 F (36.9 C)   TempSrc:  Oral Oral   SpO2: 96% 98% 95%   Weight:      Height:        Intake/Output Summary (Last 24 hours) at 08/10/16 0807 Last data filed at 08/10/16 0238  Gross per 24 hour  Intake          1868.75 ml  Output              476 ml  Net          1392.75 ml   Filed Weights   08/09/16 1253  Weight: 73.5 kg (162 lb)    Examination:  General exam: Appears calm and comfortable  Respiratory system: Clear to auscultation. Respiratory effort normal. Cardiovascular system: S1 & S2 heard, RRR. No JVD, murmurs, rubs, gallops or clicks. No pedal edema. Gastrointestinal system: Abdomen is mild  distended, soft and mild tender. No organomegaly or masses felt. Normal bowel sounds heard. Central nervous system: Alert and oriented. No focal neurological deficits. Extremities: Symmetric 5 x 5 power. Skin: No rashes, lesions or ulcers Psychiatry: Judgement and insight appear normal. Mood & affect appropriate.     Data Reviewed: I have personally reviewed following labs and imaging studies  CBC:  Recent Labs Lab 08/09/16 1300 08/10/16 0445  WBC 12.5* 11.1*  NEUTROABS 10.3*  --   HGB 13.6 11.3*  HCT 41.1 34.5*  MCV 93.4 95.0  PLT 199 355*   Basic Metabolic Panel:  Recent Labs Lab 08/09/16 1300 08/10/16 0445    NA 137 137  K 4.1 3.9  CL 108 110  CO2 21* 22  GLUCOSE 121* 115*  BUN 10 9  CREATININE 1.00 0.88  CALCIUM 9.4 8.4*   GFR: Estimated Creatinine Clearance: 51.2 mL/min (by C-G formula based on SCr of 0.88 mg/dL). Liver Function Tests:  Recent Labs Lab 08/09/16 1300 08/10/16 0445  AST 23 17  ALT 16 12*  ALKPHOS 46 43  BILITOT 1.3* 1.3*  PROT 7.0 5.7*  ALBUMIN 4.0 3.0*   No results for input(s): LIPASE, AMYLASE in the last 168 hours. No results for input(s): AMMONIA in the last 168 hours. Coagulation Profile: No results for input(s): INR, PROTIME in the last 168 hours. Cardiac Enzymes: No results for input(s): CKTOTAL, CKMB, CKMBINDEX, TROPONINI in the last 168 hours. BNP (last 3 results) No results for input(s): PROBNP in the last 8760 hours. HbA1C: No results for input(s): HGBA1C in the last 72 hours. CBG: No results for input(s): GLUCAP in the last 168 hours. Lipid Profile: No results for input(s): CHOL, HDL, LDLCALC, TRIG, CHOLHDL, LDLDIRECT in the last 72 hours. Thyroid Function Tests: No results for input(s): TSH, T4TOTAL, FREET4, T3FREE, THYROIDAB in the last 72 hours. Anemia Panel: No results for input(s): VITAMINB12, FOLATE, FERRITIN, TIBC, IRON, RETICCTPCT in the last 72 hours. Sepsis Labs:  Recent Labs Lab 08/09/16 1323  LATICACIDVEN 1.29    No results found for this or any previous visit (from the past 240 hour(s)).       Radiology Studies: Dg Chest 2 View  Result Date: 08/09/2016 CLINICAL DATA:  Generalized abdominal pain, fever and fatigue for 2 days. EXAM: CHEST  2 VIEW COMPARISON:  Chest radiograph May 09, 2008 FINDINGS: Cardiac silhouette is mildly enlarged unchanged. Tortuous mildly calcified aorta. Increased lung volumes, flattened hemidiaphragms, mild chronic interstitial changes. No pleural effusion or focal consolidation. No pneumothorax. Mild degenerative change of the thoracic spine. IMPRESSION: COPD.  Mild cardiomegaly.  No acute  pulmonary process. Electronically Signed   By: Elon Alas M.D.   On: 08/09/2016 15:06   Ct Abdomen Pelvis W Contrast  Result Date: 08/09/2016 CLINICAL DATA:  82 year old female left lower quadrant pain onset yesterday. Constipation for 2 days. Initial encounter. EXAM: CT ABDOMEN AND PELVIS WITH CONTRAST TECHNIQUE: Multidetector CT imaging of the abdomen and pelvis was performed using the standard protocol following bolus administration of intravenous contrast. CONTRAST:  181mL ISOVUE-300 IOPAMIDOL (ISOVUE-300) INJECTION 61% COMPARISON:  07/16/2014 CT. FINDINGS: Lower chest: Minimal scarring/ atelectasis. Hepatobiliary: No hepatic mass.  No calcified gallstones. Pancreas: No pancreatic mass or inflammation. Spleen: 1 cm nonspecific splenic low-density lesion. It is difficult to state this was present previously as prior exams performed without contrast. Adrenals/Urinary Tract: Bilateral cysts largest measuring up to 2 cm left upper pole. No hydronephrosis. No adrenal lesion. Stomach/Bowel: 5 cm long segment of sigmoid colon with diffuse  inflammation which may reflect changes of diverticulitis however, underlying mass not excluded. Pelvic fluid without well-defined drainable abscess. Within the left aspect of pelvic fluid is a 4 cm structure which may represent previously noted enlarged ovary and will need to be assessed with pelvic sonogram after the acute episode has cleared. Decompressed stomach.  No acute abnormality noted. Vascular/Lymphatic: Aortic calcifications with ectasia. No aneurysm or high-grade stenosis. Calcification common iliac arteries mild to moderate narrowing and slight ectasia. Small pelvic lymph nodes bilaterally. Reproductive: Calcification uterus suggestive of small fibroid. Left ovarian mass may be present as noted above. Other: No free air. Musculoskeletal: Mild scoliosis lumbar spine convex right. IMPRESSION: Five cm long segment of sigmoid colon with diffuse inflammation which  may reflect changes of diverticulitis however, underlying mass not excluded. Pelvic fluid without well-defined drainable abscess. Within the left aspect of pelvic fluid is a 4 cm structure which may represent previously noted enlarged ovary and will need to be assessed with pelvic sonogram after the acute episode has cleared. Aortic calcifications with ectasia. Calcification common iliac arteries with mild to moderate narrowing and slight ectasia. 1 cm nonspecific splenic low-density lesion. It is difficult to state this was present previously as prior exams performed without contrast. Electronically Signed   By: Genia Del M.D.   On: 08/09/2016 15:52        Scheduled Meds: . bisoprolol  2.5 mg Oral Daily  . enoxaparin (LOVENOX) injection  40 mg Subcutaneous Q24H  . escitalopram  20 mg Oral Daily  . famotidine (PEPCID) IV  20 mg Intravenous Q12H  . levofloxacin (LEVAQUIN) IV  750 mg Intravenous Q24H  . LORazepam  1 mg Oral QHS  . metronidazole  500 mg Intravenous Q8H  . sodium chloride  500 mL Intravenous Once   Continuous Infusions: . sodium chloride 75 mL/hr at 08/09/16 1951     LOS: 1 day    Time spent: 358 minutes.     Elmarie Shiley, MD [facility] Pager [phone]  If 7PM-7AM, please contact night-coverage [email or web address] Password [email or web address] 08/10/2016, 8:07 AM

## 2016-08-10 NOTE — Progress Notes (Signed)
RN spoke with clinical pharmacist, Helene Kelp, about coming to speak with patient about her antibiotics. Per pharmacist, she will take a look at patient's regimen and come speak to her about her antibiotics.

## 2016-08-11 DIAGNOSIS — E876 Hypokalemia: Secondary | ICD-10-CM

## 2016-08-11 DIAGNOSIS — I10 Essential (primary) hypertension: Secondary | ICD-10-CM

## 2016-08-11 DIAGNOSIS — K5730 Diverticulosis of large intestine without perforation or abscess without bleeding: Secondary | ICD-10-CM

## 2016-08-11 DIAGNOSIS — Z8659 Personal history of other mental and behavioral disorders: Secondary | ICD-10-CM

## 2016-08-11 DIAGNOSIS — K5732 Diverticulitis of large intestine without perforation or abscess without bleeding: Principal | ICD-10-CM

## 2016-08-11 DIAGNOSIS — F411 Generalized anxiety disorder: Secondary | ICD-10-CM

## 2016-08-11 LAB — CBC
HEMATOCRIT: 32.5 % — AB (ref 36.0–46.0)
Hemoglobin: 10.8 g/dL — ABNORMAL LOW (ref 12.0–15.0)
MCH: 30.8 pg (ref 26.0–34.0)
MCHC: 33.2 g/dL (ref 30.0–36.0)
MCV: 92.6 fL (ref 78.0–100.0)
PLATELETS: 147 10*3/uL — AB (ref 150–400)
RBC: 3.51 MIL/uL — AB (ref 3.87–5.11)
RDW: 13.4 % (ref 11.5–15.5)
WBC: 7 10*3/uL (ref 4.0–10.5)

## 2016-08-11 LAB — BASIC METABOLIC PANEL
ANION GAP: 6 (ref 5–15)
CO2: 22 mmol/L (ref 22–32)
Calcium: 8.3 mg/dL — ABNORMAL LOW (ref 8.9–10.3)
Chloride: 111 mmol/L (ref 101–111)
Creatinine, Ser: 0.89 mg/dL (ref 0.44–1.00)
GFR calc Af Amer: >60 mL/min (ref >60)
GFR, EST NON AFRICAN AMERICAN: 60 mL/min — AB (ref >60)
GLUCOSE: 98 mg/dL (ref 65–99)
POTASSIUM: 3.4 mmol/L — AB (ref 3.5–5.1)
Sodium: 139 mmol/L (ref 135–145)

## 2016-08-11 MED ORDER — POTASSIUM CHLORIDE CRYS ER 20 MEQ PO TBCR
40.0000 meq | EXTENDED_RELEASE_TABLET | ORAL | Status: AC
  Administered 2016-08-11 (×2): 40 meq via ORAL
  Filled 2016-08-11 (×2): qty 2

## 2016-08-11 MED ORDER — POLYETHYLENE GLYCOL 3350 17 G PO PACK
17.0000 g | PACK | Freq: Every day | ORAL | Status: DC
  Administered 2016-08-12: 17 g via ORAL
  Filled 2016-08-11: qty 1

## 2016-08-11 NOTE — Progress Notes (Signed)
Pt. remains anxious. Has slept very little. Continues to voice concern about her treatment plan effectiveness. Explanations and support given to pt.

## 2016-08-11 NOTE — Progress Notes (Addendum)
PROGRESS NOTE    Carol Hardin  JGG:[id number] DOB: [birth year] DOA: 08/09/2016 PCP: Leeroy Cha, MD   Brief Narrative: Carol Hardin is a 82 y.o. female with medical history significant for diverticulosis, presenting with a 2 day history of abdominal pain, nausea without vomiting, mild abdominal distention, decreased stool output with last bowel movement the day before yesterday , fever up to 101, generalized fatigue, and  poor oral intake especially with fluids. She reports similar symptoms when she has a diverticulitis flare several years ago.   CT of the abdomen and pelvis is diffuse inflammation 5 cm in the sigmoid colon, consistent with diverticulitis, some pelvic fluid. Of note, within the left aspect of the pelvic fluid, there is a 4 cm structure, likely enlarged ovary, seen in prior CT in 2012    Assessment & Plan: Principal Problem:   Diverticulosis of colon Active Problems:   Hypercholesterolemia   History of anxiety   History of depression   Diverticulosis   Low blood pressure   Diverticulosis of large intestine without perforation or abscess without bleeding   Generalized anxiety disorder   Irritable bowel syndrome without diarrhea   Mixed hyperlipidemia   Diverticulitis   Diverticulitis of intestine without perforation or abscess without bleeding   Acute diverticulitis as evidenced by CT Abdomen and pelvis; also with WBC 12.5. On admisison mildly dehydrated and with active nausea and vomiting. -normal Lactic acid 1.29 .  -will continue Full liquid diet, advance as tolerated  -Continue Levaquin and Flagyl IV  -Pepcid IV -continue also IV fluids and supportive  -WBC WNL now; no fever.  -continue Analgesics and antiemetics as needed.  4 cm structure which may represent previously noted enlarged ovary : will need to be assessed with pelvic sonogram after the acute episode has cleared. -Follow up with PCP and most likely referral to GYN as an  outpatient. Patient aware of results.   Hypertension  -controlled and in occasion  on soft side -will continue bisoprolol with holding parameters  Depression and anxiety  -Continue home  Lexapro  -PRN ativan -will benefit of outpatient follow up with psychiatry  Hypokalemia -will replete as needed -most likely from GI loses -will check Mg level  DVT prophylaxis: Lovenox Code Status: full code.  Family Communication:care discussed with patient  Disposition Plan: remain inpatient for treatment of diverticulitis, continue IV fluids and IV antibiotics for now. Patient educated about importance of using antiemetics and will attempt diet advance as per her request .    Consultants:   none   Procedures: see below for x-ray reports    Antimicrobials:  Levaquin 10-30 Flagyl 10-30   Subjective: Abdominal pain is better. Experienced nausea and vomiting this morning. Also complaining of headaches.    Objective: Vitals:   08/10/16 2211 08/11/16 0434 08/11/16 1000 08/11/16 1315  BP: 118/70 114/72 (!) 93/54 117/70  Pulse: (!) 56 61 64 79  Resp: 19 19    Temp: 98.3 F (36.8 C) 98.1 F (36.7 C) 98.1 F (36.7 C) 98.3 F (36.8 C)  TempSrc: Oral Oral Oral Oral  SpO2: 97% 98% 95% 97%  Weight:      Height:        Intake/Output Summary (Last 24 hours) at 08/11/16 1821 Last data filed at 08/11/16 1035  Gross per 24 hour  Intake             1459 ml  Output             1650 ml  Net             -191 ml   Filed Weights   08/09/16 1253  Weight: 73.5 kg (162 lb)    Examination: General exam: Appears calm and comfortable. Reports feeling somewhat better. Experience nausea and vomiting earlier. Patient also with HA. Respiratory system: Clear to auscultation. Respiratory effort normal. Cardiovascular system: S1 & S2 heard, RRR. No JVD, murmurs, rubs, gallops or clicks. No pedal edema. Gastrointestinal system: Abdomen is mild distended, soft and minimal tender to palpation on  LLQ. No organomegaly or masses felt. Normal bowel sounds heard. Central nervous system: Alert and oriented. No focal neurological deficits. Extremities: Symmetric 5 x 5 power. Psychiatry: Judgement and insight appear normal. Patient was slightly anxious, but appropriate overall.   Data Reviewed: I have personally reviewed following labs and imaging studies  CBC:  Recent Labs Lab 08/09/16 1300 08/10/16 0445 08/11/16 0519  WBC 12.5* 11.1* 7.0  NEUTROABS 10.3*  --   --   HGB 13.6 11.3* 10.8*  HCT 41.1 34.5* 32.5*  MCV 93.4 95.0 92.6  PLT 199 148* 025*   Basic Metabolic Panel:  Recent Labs Lab 08/09/16 1300 08/10/16 0445 08/11/16 0519  NA 137 137 139  K 4.1 3.9 3.4*  CL 108 110 111  CO2 21* 22 22  GLUCOSE 121* 115* 98  BUN 10 9 <5*  CREATININE 1.00 0.88 0.89  CALCIUM 9.4 8.4* 8.3*   GFR: Estimated Creatinine Clearance: 50.6 mL/min (by C-G formula based on SCr of 0.89 mg/dL).   Liver Function Tests:  Recent Labs Lab 08/09/16 1300 08/10/16 0445  AST 23 17  ALT 16 12*  ALKPHOS 46 43  BILITOT 1.3* 1.3*  PROT 7.0 5.7*  ALBUMIN 4.0 3.0*   Sepsis Labs:  Recent Labs Lab 08/09/16 1323  LATICACIDVEN 1.29    Recent Results (from the past 240 hour(s))  Culture, blood (Routine x 2)     Status: None (Preliminary result)   Collection Time: 08/09/16  1:00 PM  Result Value Ref Range Status   Specimen Description BLOOD RIGHT ARM  Final   Special Requests BOTTLES DRAWN AEROBIC AND ANAEROBIC 4CC  Final   Culture NO GROWTH 2 DAYS  Final   Report Status PENDING  Incomplete  Culture, blood (Routine x 2)     Status: None (Preliminary result)   Collection Time: 08/09/16  1:39 PM  Result Value Ref Range Status   Specimen Description BLOOD RIGHT ANTECUBITAL  Final   Special Requests BOTTLES DRAWN AEROBIC AND ANAEROBIC 5ML  Final   Culture NO GROWTH 2 DAYS  Final   Report Status PENDING  Incomplete  Urine culture     Status: Abnormal   Collection Time: 08/09/16  5:21 PM    Result Value Ref Range Status   Specimen Description URINE, RANDOM  Final   Special Requests NONE  Final   Culture MULTIPLE SPECIES PRESENT, SUGGEST RECOLLECTION (A)  Final   Report Status 08/10/2016 FINAL  Final     Radiology Studies: No results found.   Scheduled Meds: . bisoprolol  2.5 mg Oral Daily  . enoxaparin (LOVENOX) injection  40 mg Subcutaneous Q24H  . escitalopram  20 mg Oral Daily  . famotidine (PEPCID) IV  20 mg Intravenous Q12H  . levofloxacin (LEVAQUIN) IV  750 mg Intravenous Q24H  . LORazepam  1 mg Oral QHS  . metronidazole  500 mg Intravenous Q8H  . polyethylene glycol  17 g Oral Daily  . sodium chloride  500 mL  Intravenous Once   Continuous Infusions: . sodium chloride 75 mL/hr at 08/11/16 0623     LOS: 2 days    Time spent: 25 minutes.     Barton Dubois, MD [facility] Pager [phone]  If 7PM-7AM, please contact night-coverage [email or web address] Password [facility] 08/11/2016, 6:21 PM

## 2016-08-12 DIAGNOSIS — E7800 Pure hypercholesterolemia, unspecified: Secondary | ICD-10-CM

## 2016-08-12 DIAGNOSIS — K5909 Other constipation: Secondary | ICD-10-CM

## 2016-08-12 DIAGNOSIS — E876 Hypokalemia: Secondary | ICD-10-CM

## 2016-08-12 DIAGNOSIS — I10 Essential (primary) hypertension: Secondary | ICD-10-CM

## 2016-08-12 DIAGNOSIS — K5792 Diverticulitis of intestine, part unspecified, without perforation or abscess without bleeding: Secondary | ICD-10-CM

## 2016-08-12 LAB — BASIC METABOLIC PANEL
ANION GAP: 6 (ref 5–15)
BUN: <5 mg/dL — ABNORMAL LOW (ref 6–20)
CALCIUM: 8.7 mg/dL — AB (ref 8.9–10.3)
CO2: 22 mmol/L (ref 22–32)
CREATININE: 0.91 mg/dL (ref 0.44–1.00)
Chloride: 112 mmol/L — ABNORMAL HIGH (ref 101–111)
GFR, EST NON AFRICAN AMERICAN: 58 mL/min — AB (ref >60)
GLUCOSE: 93 mg/dL (ref 65–99)
Potassium: 4.1 mmol/L (ref 3.5–5.1)
Sodium: 140 mmol/L (ref 135–145)

## 2016-08-12 LAB — MAGNESIUM: Magnesium: 1.8 mg/dL (ref 1.7–2.4)

## 2016-08-12 MED ORDER — POLYETHYLENE GLYCOL 3350 17 G PO PACK: 17.0000 g | PACK | Freq: Every day | ORAL | 0 refills | Status: DC

## 2016-08-12 MED ORDER — FAMOTIDINE 20 MG PO TABS: 20.0000 mg | ORAL_TABLET | Freq: Two times a day (BID) | ORAL | Status: DC

## 2016-08-12 MED ORDER — SACCHAROMYCES BOULARDII 250 MG PO CAPS: 250.0000 mg | ORAL_CAPSULE | Freq: Two times a day (BID) | ORAL | Status: DC

## 2016-08-12 MED ORDER — SACCHAROMYCES BOULARDII 250 MG PO CAPS: 250.0000 mg | ORAL_CAPSULE | Freq: Two times a day (BID) | ORAL | 0 refills | Status: DC

## 2016-08-12 MED ORDER — AMOXICILLIN-POT CLAVULANATE 875-125 MG PO TABS: 1.0000 | ORAL_TABLET | Freq: Two times a day (BID) | ORAL | Status: DC

## 2016-08-12 MED ORDER — ONDANSETRON 8 MG PO TBDP: 8.0000 mg | ORAL_TABLET | Freq: Three times a day (TID) | ORAL | 0 refills | Status: DC | PRN

## 2016-08-12 MED ORDER — AMOXICILLIN-POT CLAVULANATE 875-125 MG PO TABS: 1.0000 | ORAL_TABLET | Freq: Two times a day (BID) | ORAL | 0 refills | Status: AC

## 2016-08-12 NOTE — Discharge Summary (Signed)
Physician Discharge Summary  Carol Hardin EHU:[id number] DOB: 03-07-1933 DOA: 08/09/2016  PCP: Leeroy Cha, MD  Admit date: 08/09/2016 Discharge date: 08/12/2016  Time spent: 35 minutes  Recommendations for Outpatient Follow-up:  Repeat BMET and Mg to follow electrolytes and renal function  Please make sure patient follow up with Gyn and psychiatry as an outpatient (see below for details) Will need follow up with GI for colonoscopy in 6 weeks or so  Discharge Diagnoses:  Principal Problem:   Diverticulosis of colon Active Problems:   Hypercholesterolemia   History of anxiety   History of depression   Diverticulosis   Low blood pressure   Diverticulosis of large intestine without perforation or abscess without bleeding   Generalized anxiety disorder   Irritable bowel syndrome without diarrhea   Mixed hyperlipidemia   Diverticulitis   Diverticulitis of intestine without perforation or abscess without bleeding   Essential hypertension   Hypokalemia   Other constipation   Discharge Condition: stable and improved. Discharge home with instructions to follow up with PCP in 1 week.  Diet recommendation: low residue heart healthy diet   Filed Weights   08/09/16 1253  Weight: 73.5 kg (162 lb)    History of present illness:  82 y.o. female with medical history significant for diverticulosis, presenting with a 2 day history of abdominal pain, nausea without vomiting, mild abdominal distention, decreased stool output with last bowel movement the day before yesterday , fever up to 101, generalized fatigue, and  poor oral intake especially with fluids. She reports similar symptoms when she has a diverticulitis flare several years ago. SHe attributes these symptoms today to eating tomatoes and seeds . Denies any respiratory complaints. Denies any chest pain or palpitations. Denies lower extremity swelling.  Denies any dysuria. Denies abnormal skin rashes, or neuropathy.  Denies any bleeding issues such as epistaxis, hematemesis, hematuria or hematochezia. Ambulating without difficulty.  Hospital Course:  Acute diverticulitis as evidenced by CT Abdomen and pelvis; also with WBC 12.5. On admisison mildly dehydrated and with active nausea and vomiting. Normal Lactic acid 1.29 .  -Will need outpatient follow up for colonoscopy in 6 weeks or so; she expressed to be due for procedure anyway.  -as per patient request will discharge on Augmentin BID (8 more days of antibiotics left at discharge) -advise to keep herself well hydrated and to follow low residue diet  -WBC WNL now; no fever.  -continue PRN antiemetics.  4 cm structure which may represent previously noted enlarged ovary :  -Will need to be assessed with pelvic sonogram after the acute episode has cleared. -Follow up with PCP and most likely referral to GYN as an outpatient. Patient aware of results.   Hypertension  -controlled and in occasion on soft side -will continue bisoprolol as prescribed prior to admission   Depression and anxiety  -Continue homeLexapro  -continue PRN ativan -will benefit of outpatient follow up with psychiatry for medication adjustments (dosage or even changes to new regimen)  Hypokalemia -repleted and WNL at discharge -Mg WNL as well -will recommend BMEt at follow up to assess electrolytes trend   Procedures:  See below for x-ray reports   Consultations:  None   Discharge Exam: Vitals:   08/11/16 2100 08/12/16 0522  BP: 111/60 (!) 90/47  Pulse: 88 65  Resp: 18 17  Temp: 98.4 F (36.9 C) 98.5 F (36.9 C)   General exam: Appears calm and comfortable. Reports feeling much better and denies any further vomiting. Patient denies HA  and endorses ability to tolerate soft diet. She wants to go home on Augmentin; described not real allergy to penicillin and decline use of levaquin/cipro and flagyl (to many doses).  Respiratory system: Clear to auscultation.  Respiratory effort normal. Cardiovascular system: S1 & S2 heard, RRR. No JVD, murmurs, rubs, gallops or clicks. No pedal edema. Gastrointestinal system: Abdomen is no distended, soft and minimal tender to palpation on LLQ. No organomegaly or masses felt. Normal bowel sounds heard. Central nervous system: Alert and oriented. No focal neurological deficits. Extremities: Symmetric 5 x 5 power. Psychiatry: Judgement and insight appear normal. Patient was slightly anxious, but appropriate overall.    Discharge Instructions   Discharge Instructions    Diet - low sodium heart healthy    Complete by:  As directed    Discharge instructions    Complete by:  As directed    Keep yourself well hydrated Take medications as prescribed Please arrange follow up with PCP in 1 week Follow a low residue diet     Current Discharge Medication List    START taking these medications   Details  amoxicillin-clavulanate (AUGMENTIN) 875-125 MG tablet Take 1 tablet by mouth every 12 (twelve) hours. Qty: 16 tablet, Refills: 0    ondansetron (ZOFRAN ODT) 8 MG disintegrating tablet Take 1 tablet (8 mg total) by mouth every 8 (eight) hours as needed for nausea or vomiting. Qty: 20 tablet, Refills: 0    polyethylene glycol (MIRALAX / GLYCOLAX) packet Take 17 g by mouth daily. Qty: 14 each, Refills: 0    saccharomyces boulardii (FLORASTOR) 250 MG capsule Take 1 capsule (250 mg total) by mouth 2 (two) times daily. Qty: 40 capsule, Refills: 0      CONTINUE these medications which have NOT CHANGED   Details  bisoprolol (ZEBETA) 5 MG tablet TAKE 1/2 TABLET BY MOUTH DAILY OR AS DIRECTED Qty: 45 tablet, Refills: 1    escitalopram (LEXAPRO) 20 MG tablet Take 20 mg by mouth daily. Reported on 02/06/2016 Refills: 3    LORazepam (ATIVAN) 1 MG tablet Take 1 mg by mouth at bedtime.        Allergies  Allergen Reactions  . Aripiprazole Other (See Comments)  . Hylan G-F 20 Other (See Comments)  . Other Other  (See Comments)  . Paroxetine Hcl Other (See Comments)    Headaches (a long time ago- pt doesn't really remember)  . Toprol Xl [Metoprolol Succinate] Other (See Comments)    Dizziness--pt doesn't really remember   . Vancomycin Other (See Comments)    Pt reports that they gave it too fast- she started having itching in the scalp  . Penicillins Rash   Follow-up Information    Leeroy Cha, MD. Schedule an appointment as soon as possible for a visit in 1 week(s).   Specialty:  Internal Medicine Contact information: [address] [place]  [address] [phone]           The results of significant diagnostics from this hospitalization (including imaging, microbiology, ancillary and laboratory) are listed below for reference.    Significant Diagnostic Studies: Dg Chest 2 View  Result Date: 08/09/2016 CLINICAL DATA:  Generalized abdominal pain, fever and fatigue for 2 days. EXAM: CHEST  2 VIEW COMPARISON:  Chest radiograph May 09, 2008 FINDINGS: Cardiac silhouette is mildly enlarged unchanged. Tortuous mildly calcified aorta. Increased lung volumes, flattened hemidiaphragms, mild chronic interstitial changes. No pleural effusion or focal consolidation. No pneumothorax. Mild degenerative change of the thoracic spine. IMPRESSION: COPD.  Mild cardiomegaly.  No acute pulmonary process. Electronically Signed   By: Elon Alas M.D.   On: 08/09/2016 15:06   Ct Abdomen Pelvis W Contrast  Result Date: 08/09/2016 CLINICAL DATA:  82 year old female left lower quadrant pain onset yesterday. Constipation for 2 days. Initial encounter. EXAM: CT ABDOMEN AND PELVIS WITH CONTRAST TECHNIQUE: Multidetector CT imaging of the abdomen and pelvis was performed using the standard protocol following bolus administration of intravenous contrast. CONTRAST:  174mL ISOVUE-300 IOPAMIDOL (ISOVUE-300) INJECTION 61% COMPARISON:  07/16/2014 CT. FINDINGS: Lower chest: Minimal scarring/  atelectasis. Hepatobiliary: No hepatic mass.  No calcified gallstones. Pancreas: No pancreatic mass or inflammation. Spleen: 1 cm nonspecific splenic low-density lesion. It is difficult to state this was present previously as prior exams performed without contrast. Adrenals/Urinary Tract: Bilateral cysts largest measuring up to 2 cm left upper pole. No hydronephrosis. No adrenal lesion. Stomach/Bowel: 5 cm long segment of sigmoid colon with diffuse inflammation which may reflect changes of diverticulitis however, underlying mass not excluded. Pelvic fluid without well-defined drainable abscess. Within the left aspect of pelvic fluid is a 4 cm structure which may represent previously noted enlarged ovary and will need to be assessed with pelvic sonogram after the acute episode has cleared. Decompressed stomach.  No acute abnormality noted. Vascular/Lymphatic: Aortic calcifications with ectasia. No aneurysm or high-grade stenosis. Calcification common iliac arteries mild to moderate narrowing and slight ectasia. Small pelvic lymph nodes bilaterally. Reproductive: Calcification uterus suggestive of small fibroid. Left ovarian mass may be present as noted above. Other: No free air. Musculoskeletal: Mild scoliosis lumbar spine convex right. IMPRESSION: Five cm long segment of sigmoid colon with diffuse inflammation which may reflect changes of diverticulitis however, underlying mass not excluded. Pelvic fluid without well-defined drainable abscess. Within the left aspect of pelvic fluid is a 4 cm structure which may represent previously noted enlarged ovary and will need to be assessed with pelvic sonogram after the acute episode has cleared. Aortic calcifications with ectasia. Calcification common iliac arteries with mild to moderate narrowing and slight ectasia. 1 cm nonspecific splenic low-density lesion. It is difficult to state this was present previously as prior exams performed without contrast. Electronically  Signed   By: Genia Del M.D.   On: 08/09/2016 15:52    Microbiology: Recent Results (from the past 240 hour(s))  Culture, blood (Routine x 2)     Status: None (Preliminary result)   Collection Time: 08/09/16  1:00 PM  Result Value Ref Range Status   Specimen Description BLOOD RIGHT ARM  Final   Special Requests BOTTLES DRAWN AEROBIC AND ANAEROBIC 4CC  Final   Culture NO GROWTH 3 DAYS  Final   Report Status PENDING  Incomplete  Culture, blood (Routine x 2)     Status: None (Preliminary result)   Collection Time: 08/09/16  1:39 PM  Result Value Ref Range Status   Specimen Description BLOOD RIGHT ANTECUBITAL  Final   Special Requests BOTTLES DRAWN AEROBIC AND ANAEROBIC 5ML  Final   Culture NO GROWTH 3 DAYS  Final   Report Status PENDING  Incomplete  Urine culture     Status: Abnormal   Collection Time: 08/09/16  5:21 PM  Result Value Ref Range Status   Specimen Description URINE, RANDOM  Final   Special Requests NONE  Final   Culture MULTIPLE SPECIES PRESENT, SUGGEST RECOLLECTION (A)  Final   Report Status 08/10/2016 FINAL  Final     Labs: Basic Metabolic Panel:  Recent Labs Lab 08/09/16 1300 08/10/16 0445  08/11/16 0519 08/12/16 0607  NA 137 137 139 140  K 4.1 3.9 3.4* 4.1  CL 108 110 111 112*  CO2 21* 22 22 22   GLUCOSE 121* 115* 98 93  BUN 10 9 <5* <5*  CREATININE 1.00 0.88 0.89 0.91  CALCIUM 9.4 8.4* 8.3* 8.7*  MG  --   --   --  1.8   Liver Function Tests:  Recent Labs Lab 08/09/16 1300 08/10/16 0445  AST 23 17  ALT 16 12*  ALKPHOS 46 43  BILITOT 1.3* 1.3*  PROT 7.0 5.7*  ALBUMIN 4.0 3.0*   CBC:  Recent Labs Lab 08/09/16 1300 08/10/16 0445 08/11/16 0519  WBC 12.5* 11.1* 7.0  NEUTROABS 10.3*  --   --   HGB 13.6 11.3* 10.8*  HCT 41.1 34.5* 32.5*  MCV 93.4 95.0 92.6  PLT 199 148* 147*     Signed:  Barton Dubois MD.  [facility] 08/12/2016, 11:05 AM

## 2016-08-13 ENCOUNTER — Telehealth: Payer: Self-pay | Admitting: Cardiology

## 2016-08-13 NOTE — Telephone Encounter (Signed)
New message    Pt vebalized that she wants rn to call her back she did not want to disclose any information

## 2016-08-13 NOTE — Telephone Encounter (Signed)
I would hold it for now, we will arrange for a visit in 1 month and re-evaluate.

## 2016-08-13 NOTE — Telephone Encounter (Signed)
Pt is calling Dr Meda Coffee, as instructed by the discharge RN at [facility], to inform that the pt was recently discharged from the hospital with Diverticulitis with fever.  Pt states that her entire stay at the hospital, her BP was low and they held her Bisoprolol the whole time.  Pt states that on discharge, the RN told her to follow-up with her Cardiologist, to have [place] advise if she should remain on this medication with her low average BP, or should she resume or be switched to a different regimen. Pt states that she will be purchasing a BP cuff today, to keep continuous monitor of her readings. Informed the pt that I will endorse this information to Dr Meda Coffee to review and advise on, and follow-up with the pt thereafter.  Pt verbalized understanding and agrees with this plan.

## 2016-08-13 NOTE — Telephone Encounter (Signed)
Notified the pt that per Dr Meda Coffee, we will hold her bisoprolol for now, and arrange for her to come in for follow-up to re-evaluate this, in one month.  Informed the pt that she will more than likely see an Extender on Dr [employer] team.  Informed the pt that I will send our [facility] schedulers a message to call the pt back, and arrange for follow-up in the clinic for one month out.  Pt verbalized understanding and agrees with this plan.

## 2016-08-13 NOTE — Telephone Encounter (Signed)
Pt has an appointment on 09/20/16 with Dayna Dunn,patient is aware and appointment mailed.

## 2016-08-14 LAB — CULTURE, BLOOD (ROUTINE X 2)
CULTURE: NO GROWTH
CULTURE: NO GROWTH

## 2016-08-19 ENCOUNTER — Ambulatory Visit (INDEPENDENT_AMBULATORY_CARE_PROVIDER_SITE_OTHER): Payer: Medicare Other | Admitting: Cardiology

## 2016-08-19 VITALS — BP 116/62 | HR 72 | Ht 65.0 in | Wt 161.0 lb

## 2016-08-19 DIAGNOSIS — I493 Ventricular premature depolarization: Secondary | ICD-10-CM | POA: Diagnosis not present

## 2016-08-19 DIAGNOSIS — R002 Palpitations: Secondary | ICD-10-CM

## 2016-08-19 DIAGNOSIS — I340 Nonrheumatic mitral (valve) insufficiency: Secondary | ICD-10-CM | POA: Diagnosis not present

## 2016-08-19 DIAGNOSIS — I341 Nonrheumatic mitral (valve) prolapse: Secondary | ICD-10-CM | POA: Diagnosis not present

## 2016-08-19 NOTE — Progress Notes (Signed)
Cardiology Office Note    Date:  08/19/2016   ID:  Carol Hardin, DOB Jan 13, 1933, MRN [id number]  PCP:  Leeroy Cha, MD  Cardiologist:   Ena Dawley, MD   Chief complain: Mitral prolapsed mitral regurgitation transferring care from Dr. Mare Ferrari to Dr. Meda Coffee.  History of Present Illness:  Carol Hardin is a 82 y.o. female was been followed by Dr. Allene Dillon for years for history of mitral valve prolapse. She does not have any history of ischemic heart disease. She does have a history of chronic depression and previously was followed by Dr.Kaur. Presently she does not have a psychiatrist She states that she no longer is seeing her. Dr. Nehemiah Settle was her internist before he retired. Since last visit she feels that the switch back to bisoprolol from Bystolic has been helpful. She takes the bisoprolol in the evening.She had an MRI of the brain and was told that the MRI showed no significant abnormality except that she had some aging of her brain.She has had a problem with chronic tinnitus as well as migraine variants. Recently she has been experiencing some left-sided chest discomfort. She thought the pain was coming from her left breast. She has had 2 mammograms this year which have not shown any abnormality.   08/19/2016 - the patient was seen in July of this year when she was doing well, however she was admitted on 08/09/2016 with significant abdominal pain and was diagnosed with diverticulitis with an abscess formation treated with antibiotics and slow improvement of symptoms. This patient has been experiencing worsening of palpitation that she now feels every day mostly toward the end of the day and are sometimes associated with dizziness. While she was in the hospital she was hypotensive secondary to sepsis and her bisoprolol has been discontinued. Today she states that she still feels very weak with poor appetite but it's improving. She denies any chest pain  no lower extremity edema orthopnea or proximal nocturnal dyspnea.  Past Medical History:  Diagnosis Date  . Anxiety   . Arthritis    "fingers, right toe" (08/09/2016)  . Basal cell carcinoma of lower leg, right   . Daily headache    "I usually wake up w/a headache; sometimes it's a migraine" (08/09/2016)  . Depression   . Diverticulosis    Hx. of  . GERD (gastroesophageal reflux disease)   . Heart murmur   . Hypercholesterolemia   . Migraine   . MVP (mitral valve prolapse)   . Osteoporosis   . Palpitations   . Pneumonia    "as a kid" (08/09/2016)  . Scoliosis    mild  . Squamous cell carcinoma of neck    Past Surgical History:  Procedure Laterality Date  . BASAL CELL CARCINOMA EXCISION Right    RLE  . BLEPHAROPLASTY    . BREAST BIOPSY Left   . BREAST CYST ASPIRATION Left   . BREAST CYST EXCISION Left   . BUNIONECTOMY Bilateral   . DILATION AND CURETTAGE OF UTERUS    . EXCISIONAL HEMORRHOIDECTOMY    . KNEE ARTHROSCOPY Right 04/12/2007  . KNEE ARTHROSCOPY Left   . SQUAMOUS CELL CARCINOMA EXCISION     "neck"  . TONSILLECTOMY     Current Medications: Outpatient Medications Prior to Visit  Medication Sig Dispense Refill  . amoxicillin-clavulanate (AUGMENTIN) 875-125 MG tablet Take 1 tablet by mouth every 12 (twelve) hours. 16 tablet 0  . LORazepam (ATIVAN) 1 MG tablet Take 1 mg by mouth at bedtime.     Marland Kitchen  ondansetron (ZOFRAN ODT) 8 MG disintegrating tablet Take 1 tablet (8 mg total) by mouth every 8 (eight) hours as needed for nausea or vomiting. 20 tablet 0  . polyethylene glycol (MIRALAX / GLYCOLAX) packet Take 17 g by mouth daily. 14 each 0  . saccharomyces boulardii (FLORASTOR) 250 MG capsule Take 1 capsule (250 mg total) by mouth 2 (two) times daily. 40 capsule 0  . bisoprolol (ZEBETA) 5 MG tablet TAKE 1/2 TABLET BY MOUTH DAILY OR AS DIRECTED (Patient not taking: Reported on 08/19/2016) 45 tablet 1  . escitalopram (LEXAPRO) 20 MG tablet Take 20 mg by mouth daily.  Reported on 02/06/2016  3   No facility-administered medications prior to visit.     Allergies:   Flagyl [metronidazole]; Aripiprazole; Hylan g-f 20; Other; Paroxetine hcl; Toprol xl [metoprolol succinate]; Vancomycin; and Penicillins   Social History   Social History  . Marital status: Married    Spouse name: N/A  . Number of children: N/A  . Years of education: N/A   Social History Main Topics  . Smoking status: Former Smoker    Packs/day: 1.00    Years: 20.00    Types: Cigarettes    Quit date: 1989  . Smokeless tobacco: Never Used  . Alcohol use No  . Drug use: No  . Sexual activity: Not Currently   Other Topics Concern  . Not on file   Social History Narrative  . No narrative on file    Family History:  The patient's family history includes Heart failure in her father and mother; Leukemia in her mother.   ROS:   Please see the history of present illness.    ROS All other systems reviewed and are negative.  PHYSICAL EXAM:   VS:  BP 116/62   Pulse 72   Ht 5\' 5"  (1.651 m)   Wt 161 lb (73 kg)   BMI 26.79 kg/m    GEN: Well nourished, well developed, in no acute distress HEENT: normal Neck: no JVD, carotid bruits, or masses Cardiac: RRR; midsystolic click with late systolic murmur, no gallops,no edema  Respiratory:  clear to auscultation bilaterally, normal work of breathing GI: soft, nontender, nondistended, + BS MS: no deformity or atrophy Skin: warm and dry, no rash Neuro:  Alert and Oriented x 3, Strength and sensation are intact Psych: euthymic mood, full affect  Wt Readings from Last 3 Encounters:  08/19/16 161 lb (73 kg)  08/09/16 162 lb (73.5 kg)  04/22/16 163 lb (73.9 kg)    Studies/Labs Reviewed:   EKG:  EKG is ordered today.  The ekg ordered today demonstrates SB otherwise normal ECG.  Recent Labs: 08/10/2016: ALT 12 08/11/2016: Hemoglobin 10.8; Platelets 147 08/12/2016: BUN <5; Creatinine, Ser 0.91; Magnesium 1.8; Potassium 4.1; Sodium 140    Lipid Panel No results found for: CHOL, TRIG, HDL, CHOLHDL, VLDL, LDLCALC, LDLDIRECT  Her most recent labs from [place] performing generated 2017 show normal LFTs, normal electrolytes, normal creatinine, LDL 124, HDL 53, triglycerides 116.  ASSESSMENT:    1. Mitral valve prolapse   2. Palpitations   3. MVP (mitral valve prolapse)   4. PVC (premature ventricular contraction)   5. Mitral valve insufficiency, unspecified etiology    PLAN:   The patient is recovering from diverticulitis with an abscess formation and sepsis, at this point I won't restart her beta blocker she still rather hypotensive. She is encouraged to crease or food intake. In the meantime we will start 48 hour Holter monitor to  evaluate for her palpitations. We will also repeat an echocardiogram to reevaluate the degree of mitral regurgitation and left atrial size.  Medication Adjustments/Labs and Tests Ordered: Current medicines are reviewed at length with the patient today.  Concerns regarding medicines are outlined above.  Medication changes, Labs and Tests ordered today are listed in the Patient Instructions below. Patient Instructions  Medication Instructions:   Your physician recommends that you continue on your current medications as directed. Please refer to the Current Medication list given to you today.   Testing/Procedures:  Your physician has requested that you have an echocardiogram. Echocardiography is a painless test that uses sound waves to create images of your heart. It provides your doctor with information about the size and shape of your heart and how well your heart's chambers and valves are working. This procedure takes approximately one hour. There are no restrictions for this procedure.   Your physician has recommended that you wear a 48 HOUR holter monitor. Holter monitors are medical devices that record the heart's electrical activity. Doctors most often use these monitors to  diagnose arrhythmias. Arrhythmias are problems with the speed or rhythm of the heartbeat. The monitor is a small, portable device. You can wear one while you do your normal daily activities. This is usually used to diagnose what is causing palpitations/syncope (passing out).    Follow-Up:  AT DR Adedamola Seto'S NEXT AVAILABLE APPOINTMENT     If you need a refill on your cardiac medications before your next appointment, please call your pharmacy.      Signed, Ena Dawley, MD  08/19/2016 3:55 PM    [address], [place], [place]  [address] Phone: [phone]; Fax: [phone]

## 2016-08-19 NOTE — Patient Instructions (Signed)
Medication Instructions:   Your physician recommends that you continue on your current medications as directed. Please refer to the Current Medication list given to you today.   Testing/Procedures:  Your physician has requested that you have an echocardiogram. Echocardiography is a painless test that uses sound waves to create images of your heart. It provides your doctor with information about the size and shape of your heart and how well your heart's chambers and valves are working. This procedure takes approximately one hour. There are no restrictions for this procedure.   Your physician has recommended that you wear a 48 HOUR holter monitor. Holter monitors are medical devices that record the heart's electrical activity. Doctors most often use these monitors to diagnose arrhythmias. Arrhythmias are problems with the speed or rhythm of the heartbeat. The monitor is a small, portable device. You can wear one while you do your normal daily activities. This is usually used to diagnose what is causing palpitations/syncope (passing out).    Follow-Up:  AT DR NELSON'S NEXT AVAILABLE APPOINTMENT     If you need a refill on your cardiac medications before your next appointment, please call your pharmacy.

## 2016-08-24 ENCOUNTER — Telehealth (HOSPITAL_COMMUNITY): Payer: Self-pay | Admitting: Cardiology

## 2016-08-24 NOTE — Telephone Encounter (Signed)
I tried the patient one more time and I was able to get her, so her new appt date for her echo is now 11/16 at 11:00am. She apologized.

## 2016-08-24 NOTE — Telephone Encounter (Signed)
I called Carol Hardin to reschedule her time for her Echo on 11/30 and immediately she started voicing that she was still having problems with her palpitations and that she was going start taking her old beta blocker to help aid her current condition and she didn't understand why a doctor had to have these test done to figure out what was wrong with her heart. Allowing her to vent , she then ask me why I was calling and I voiced that I needed to change her time for her echo. She stated that it didn't matter since it was still on the 30 and couldn't be sooner. She then told me to relay the message about her condition and then she hung up the phone. While she ws talking I did find an opening on 11/16 at 11:00am but she did not give me a chance to offer this appt before hanging up the phone. I called the patient back twice and tried to reschedule her but her phone doesn't have a voice mail.

## 2016-08-25 NOTE — Telephone Encounter (Signed)
Placed a follow-up call to the pt, based on her complaints mentioned from yesterday, to the echo Scheduler.  Pt states that there has been no new change in her cardiac status, since seeing Dr Meda Coffee on 08/19/16, for complaints of palpitations.  Pt states that her palpitations mostly come on in the evening time.  Pts beta-blocker was held for one month due to hypotension she experienced, while she was admitted to the hospital.  Pt has an appt for her echo on tomorrow 11/16 and she will have her 48 hour holter monitor placed on 11/20, at our office.  Pt states that so far this morning, she has had no issues with her heart/palpitations.  Pt states she will be going in to see her PCP today, for follow-up from recent hospitalization for diverticulitis.  Encouraged the pt to make sure she is staying plenty hydrated, as Dr Meda Coffee advised at last [place].  Informed the pt that being she is asymptomatic at this time, I will just route this information to Dr Meda Coffee as a general FYI.  Advised the pt to call back if symptoms re-occur again, or worsen.  Pt verbalized understanding and agrees with this plan.  Pt gracious for all the assistance provided.

## 2016-08-26 ENCOUNTER — Telehealth: Payer: Self-pay | Admitting: *Deleted

## 2016-08-26 ENCOUNTER — Ambulatory Visit (HOSPITAL_COMMUNITY): Payer: Medicare Other | Attending: Cardiology

## 2016-08-26 ENCOUNTER — Encounter (HOSPITAL_COMMUNITY): Payer: Self-pay | Admitting: *Deleted

## 2016-08-26 ENCOUNTER — Other Ambulatory Visit: Payer: Self-pay

## 2016-08-26 DIAGNOSIS — I493 Ventricular premature depolarization: Secondary | ICD-10-CM | POA: Insufficient documentation

## 2016-08-26 DIAGNOSIS — I341 Nonrheumatic mitral (valve) prolapse: Secondary | ICD-10-CM | POA: Diagnosis not present

## 2016-08-26 DIAGNOSIS — Z87891 Personal history of nicotine dependence: Secondary | ICD-10-CM | POA: Insufficient documentation

## 2016-08-26 DIAGNOSIS — Z8249 Family history of ischemic heart disease and other diseases of the circulatory system: Secondary | ICD-10-CM | POA: Insufficient documentation

## 2016-08-26 DIAGNOSIS — I4891 Unspecified atrial fibrillation: Secondary | ICD-10-CM | POA: Insufficient documentation

## 2016-08-26 DIAGNOSIS — E785 Hyperlipidemia, unspecified: Secondary | ICD-10-CM | POA: Insufficient documentation

## 2016-08-26 DIAGNOSIS — I340 Nonrheumatic mitral (valve) insufficiency: Secondary | ICD-10-CM | POA: Diagnosis not present

## 2016-08-26 DIAGNOSIS — R002 Palpitations: Secondary | ICD-10-CM | POA: Diagnosis not present

## 2016-08-26 MED ORDER — BISOPROLOL FUMARATE 5 MG PO TABS: ORAL_TABLET | ORAL | 2 refills | Status: DC

## 2016-08-26 NOTE — Progress Notes (Unsigned)
2D Echo Performed.  Patient is in an abnormal Electrical Rhythm, Atrial Fibrillation, during exam.  Heart Rates from 100 and up to 200.  These findings were presented to Dr. Meda Coffee following exam, who adjusted some medications and scheduled a follow up for next week.  Patient is released in stable condition without complaint.   Deliah Boston, RDCS

## 2016-08-26 NOTE — Telephone Encounter (Signed)
Echo dept spoke with Dr Meda Coffee about the pt voicing complaints that she is having palpitations more frequently, since her beta blocker was held.  Dr Meda Coffee held the pts beta blocker x one month for noted Hypotension when she was recently hospitalized  for sepsis related to diverticulitis.  Per Dr Meda Coffee, verbal orders were given for this pt to restart her bisoprolol to take 1/2 tablet (5 mg) po daily, and come in to see an Extender in our office in a week, week and a 1/2.  Endorsed these orders to the pt.  Pt states she has enough bisoprolol on hand at this time.  Pt scheduled to see Ellen Henri PA-C for 11/22 at 1130, to follow-up with restart of beta blocker.  Pt verbalized understanding and agrees with this plan.

## 2016-08-30 ENCOUNTER — Telehealth: Payer: Self-pay | Admitting: Cardiology

## 2016-08-30 ENCOUNTER — Ambulatory Visit (INDEPENDENT_AMBULATORY_CARE_PROVIDER_SITE_OTHER): Payer: Medicare Other

## 2016-08-30 DIAGNOSIS — I493 Ventricular premature depolarization: Secondary | ICD-10-CM | POA: Diagnosis not present

## 2016-08-30 DIAGNOSIS — R002 Palpitations: Secondary | ICD-10-CM | POA: Diagnosis not present

## 2016-08-30 DIAGNOSIS — I340 Nonrheumatic mitral (valve) insufficiency: Secondary | ICD-10-CM

## 2016-08-30 DIAGNOSIS — I341 Nonrheumatic mitral (valve) prolapse: Secondary | ICD-10-CM | POA: Diagnosis not present

## 2016-08-30 NOTE — Telephone Encounter (Signed)
Informed the pt that per Dr Meda Coffee, her echo showed that her LVEF is moderately decreased at 40%, she has mitral valve prolapse with moderate mitral regurgitation.  Informed the pt that per Dr Meda Coffee, she was in atrial fibrillation during the acquisition, and she should continue taking her bisoprolol 2.5 mg po daily, as advised to restart at her echo visit. Informed the pt that per Dr Meda Coffee, we will discuss anticoagulation at the next week visit with Lyda Jester PA-C on this Wednesday 09/01/16 at 1130. Pt verbalized understanding and agrees with this plan.

## 2016-08-30 NOTE — Telephone Encounter (Signed)
New message  Pt call stating she was returning RN call. Please call back to discuss

## 2016-09-01 ENCOUNTER — Ambulatory Visit (INDEPENDENT_AMBULATORY_CARE_PROVIDER_SITE_OTHER): Payer: Medicare Other | Admitting: Cardiology

## 2016-09-01 ENCOUNTER — Encounter: Payer: Self-pay | Admitting: *Deleted

## 2016-09-01 ENCOUNTER — Encounter: Payer: Self-pay | Admitting: Cardiology

## 2016-09-01 VITALS — BP 108/64 | HR 47 | Ht 65.0 in | Wt 157.1 lb

## 2016-09-01 DIAGNOSIS — I480 Paroxysmal atrial fibrillation: Secondary | ICD-10-CM

## 2016-09-01 NOTE — Progress Notes (Signed)
09/01/2016 Carol Hardin   Sep 07, 1933  [id number]  Primary Physician Leeroy Cha, MD Primary Cardiologist: Dr. Meda Coffee   Reason for Visit/CC: Mitral Valve Disease  HPI:  The patient is an 82 y/o female, previously followed by Dr. Mare Ferrari and now followed by Dr. Meda Coffee. She has a h/o mitral valve prolapse and regurgitation. She does not have any history of ischemic heart disease. She was recently admitted on 08/09/2016 with significant abdominal pain and was diagnosed with diverticulitis with an abscess formation treated with antibiotics and slow improvement of symptoms. While she was in the hospital she was hypotensive secondary to sepsis and her bisoprolol has been discontinued.   On 08/19/16, she was seen by Dr. Meda Coffee. Pt complained of palpitations and dizziness. Dr. Meda Coffee arranged for a 48 hr monitor and repeat echocardiogram to reassess her mitral valve.   Her monitor was placed on 11/20. Results pending. However she was felt to be in atrial fibrillation at time of her echo. She was also noted to have reduced LVEF down to 40% with moderate MR. Dr. Meda Coffee instructed her to restart her bisoprolol, 2.5 mg daily. She also advised that she f/u today to discuss starting anticoagulation.   She just returned her monitor today. I checked with office staff. Unable to download report today will be available on Monday. EKG show sinus rhythm with bradycardia with rate of 47 bpm. However she is completely asymptomatic. No fatigue, dyspnea, dizziness, syncope/ near syncope. No recurrent palpations. Pt is refusing starting anticoagulation today until she gets full results from Holter monitor. I discussed indication of a/c and reviewed Dr. Francesca Oman recommendations but she is still fearful of starting this today. BP is soft but stable at 108/64.    Current Meds  Medication Sig  . bisoprolol (ZEBETA) 5 MG tablet TAKE 1/2 TABLET BY MOUTH DAILY OR AS DIRECTED  . LORazepam (ATIVAN) 1 MG tablet  Take 1 mg by mouth at bedtime.   . polyethylene glycol (MIRALAX / GLYCOLAX) packet Take 17 g by mouth daily. (Patient taking differently: Take 17 g by mouth daily as needed. )   Allergies  Allergen Reactions  . Omeprazole Other (See Comments)    siezure  . Flagyl [Metronidazole] Nausea And Vomiting  . Aripiprazole Other (See Comments)  . Hylan G-F 20 Other (See Comments)  . Other Other (See Comments)  . Paroxetine Hcl Other (See Comments)    Headaches (a long time ago- pt doesn't really remember)  . Toprol Xl [Metoprolol Succinate] Other (See Comments)    Dizziness--pt doesn't really remember   . Vancomycin Other (See Comments)    Pt reports that they gave it too fast- she started having itching in the scalp  . Penicillins Rash   Past Medical History:  Diagnosis Date  . Anxiety   . Arthritis    "fingers, right toe" (08/09/2016)  . Basal cell carcinoma of lower leg, right   . Daily headache    "I usually wake up w/a headache; sometimes it's a migraine" (08/09/2016)  . Depression   . Diverticulosis    Hx. of  . GERD (gastroesophageal reflux disease)   . Heart murmur   . Hypercholesterolemia   . Migraine   . MVP (mitral valve prolapse)   . Osteoporosis   . Palpitations   . Pneumonia    "as a kid" (08/09/2016)  . Scoliosis    mild  . Squamous cell carcinoma of neck    Family History  Problem Relation Age of Onset  .  Heart failure Mother   . Leukemia Mother   . Heart failure Father    Past Surgical History:  Procedure Laterality Date  . BASAL CELL CARCINOMA EXCISION Right    RLE  . BLEPHAROPLASTY    . BREAST BIOPSY Left   . BREAST CYST ASPIRATION Left   . BREAST CYST EXCISION Left   . BUNIONECTOMY Bilateral   . DILATION AND CURETTAGE OF UTERUS    . EXCISIONAL HEMORRHOIDECTOMY    . KNEE ARTHROSCOPY Right 04/12/2007  . KNEE ARTHROSCOPY Left   . SQUAMOUS CELL CARCINOMA EXCISION     "neck"  . TONSILLECTOMY     Social History   Social History  . Marital  status: Married    Spouse name: N/A  . Number of children: N/A  . Years of education: N/A   Occupational History  . Not on file.   Social History Main Topics  . Smoking status: Former Smoker    Packs/day: 1.00    Years: 20.00    Types: Cigarettes    Quit date: 1989  . Smokeless tobacco: Never Used  . Alcohol use No  . Drug use: No  . Sexual activity: Not Currently   Other Topics Concern  . Not on file   Social History Narrative  . No narrative on file     Review of Systems: General: negative for chills, fever, night sweats or weight changes.  Cardiovascular: negative for chest pain, dyspnea on exertion, edema, orthopnea, palpitations, paroxysmal nocturnal dyspnea or shortness of breath Dermatological: negative for rash Respiratory: negative for cough or wheezing Urologic: negative for hematuria Abdominal: negative for nausea, vomiting, diarrhea, bright red blood per rectum, melena, or hematemesis Neurologic: negative for visual changes, syncope, or dizziness All other systems reviewed and are otherwise negative except as noted above.   Physical Exam:  Height 5\' 5"  (1.651 m), weight 157 lb 2 oz (71.3 kg).  General appearance: alert, cooperative and no distress Neck: no carotid bruit and no JVD Lungs: clear to auscultation bilaterally Heart: regular rhythm brady rate S1, S2 normal, no murmur, click, rub or gallop Extremities: extremities normal, atraumatic, no cyanosis or edema Pulses: 2+ and symmetric Skin: Skin color, texture, turgor normal. No rashes or lesions Neurologic: Grossly normal  EKG Sinus brady 47 bpm   ASSESSMENT AND PLAN:   1. PAF: per echo report, " she was in atrial fibrillation during the acquisition". Her CHA2DS2 VASc score is a least 4 for (CHF, Age >41 and female sex). We discussed initiation of a/c today however patient is hesitant and wishes to wait until official read on monitor. She has a lot of anxiety regarding this. We will f/u with her on  Monday. Would recommend Eliquis 5 mg BID. Continue BB, bisoprolol 2.5 mg daily. Pt advised to notify us of any symptoms of symptomatic bradycardia. HR is currently in the upper 40s but she is completely asymptomatic and dose not do well off of her bisoprolol. Will continue low dose for now.   2. Systolic HF: EF 02% on recent echo. euvolemic on physical exam. Continue BB. No room in BP for ACE/ARB.  3. MVP/ Mitral Regurgitation: moderate MR on echo. EF 40%.    F/u: Keep f/u appointment on 09/20/16.   Lyda Jester PA-C 09/01/2016 11:51 AM

## 2016-09-01 NOTE — Patient Instructions (Addendum)
  Medication Instructions:  None  Labwork: None  Testing/Procedures: None  Follow-Up: Melina Copa, PA-C   Any Other Special Instructions Will Be Listed Below (If Applicable). N/A    If you need a refill on your cardiac medications before your next appointment, please call your pharmacy.  Thank you for choosing [facility]!

## 2016-09-09 ENCOUNTER — Other Ambulatory Visit (HOSPITAL_COMMUNITY): Payer: Medicare Other

## 2016-09-13 ENCOUNTER — Telehealth: Payer: Self-pay | Admitting: Cardiology

## 2016-09-13 NOTE — Telephone Encounter (Signed)
F/u Message ° °Pt returning RN call. Please call back to discuss  °

## 2016-09-13 NOTE — Telephone Encounter (Signed)
Spoke with the pt and informed her that she will see Melina Copa PA-C on 12/11 at 1:30 pm, and then she will see Dr Lovena Le in EP for new pt consult on 12/13 at 69.  Informed the pt that she will still need to discontinue her bisoprolol.  Pt verbalized understanding and agrees with this plan.  Pt gracious for all the assistance provided.

## 2016-09-13 NOTE — Telephone Encounter (Signed)
Spoke with the pt earlier to inform her that Dayna reviewed our conversation that was sent to her this morning about the pts abnormal holter monitor and Dr Francesca Oman recommendations. Informed the pt that both Dr Meda Coffee and Lisbeth Renshaw recommend that she see an EP MD for further work-up of abnormal monitor and bradycardia.  Stress test is not needed at this time.  Pt states she is still insisting on seeing Burna Mortimer on next Monday 12/11, to discuss further plan and management.  Informed the pt that I will make Dayna aware of this, and tentatively schedule her an EP appt to have, for this was recommended by Dayna.  Informed the pt that I will call her back with her EP appt date and time.  Pt verbalized understanding and agrees with this plan.

## 2016-09-13 NOTE — Telephone Encounter (Signed)
Returning a  Call from [employer]  .Marland Kitchen Thanks

## 2016-09-13 NOTE — Telephone Encounter (Signed)
  Carol Hardin, please discontinue Bisoprolol and schedule lexiscan nuclear stress test.  No EP consult yet.    Profound bradycardia down to 30' during awake hours.  Several 2 second pauses.  Very frequent PVCs > 8000 in 48 hours.  NsVTs - several with the longest of 14 beats.   The patient is asymptomatic, we will discontinue Bisoprolol.  Ischemia workup. Consider EP consult.   Notified the pt that per Dr Meda Coffee, her 48 hour holter monitor results, as mentioned above.  Informed the pt that per Dr Meda Coffee, she recommends that we discontinue her Bisoprolol, and schedule her for a lexiscan for further evaluation.   Provided 23 minutes of pt education about discontinuing her beta blocker, due to bradycardia.   Per the pt, she states she does not want to proceed with scheduling her lexiscan yet, she wants to speak with Melina Copa PA-C on her scheduled appt for next Monday 12/11 at 1:30 pm.  Pt states that she has extreme fear with any further testing needed for her heart, due to her recent traumatic hospital stay for diverticulitis.  Pt states that she is convinced her heart issues are all stemming from her diverticulitis, and when she was hospitalized with this with sepsis.  Pt is very anxious on the phone, and very anxious with [place] discontinuing her bisoprolol.  Educated the pt again on the safety behind this.  Pt states she will discuss this with her family and will make her decision up by the time she see's Dayna next Monday.   Informed the pt that I will discontinue her bisoprolol in her med list, and highly advised her to quit taking this.  Pt verbalized understanding and gracious for the assistance and time provided.  Will send this message to Dr Meda Coffee to make her aware of pts decision about holding off on her [place] until seeing Dayna.  Will route to [employer] as well.

## 2016-09-13 NOTE — Progress Notes (Signed)
Please d/c bisoprolol and order lexiscan Thank you

## 2016-09-13 NOTE — Progress Notes (Signed)
Yes, thank you.

## 2016-09-13 NOTE — Telephone Encounter (Signed)
I think that second opinion would actually be best coming from an EP doctor rather than myself. I have not met this patient but looking through her information, I would probably recommend she see EP to get their input since she had significant PVCs and NSVT which typically require treatment with beta blocker, but HR has been prohibitive by this monitor. This would likely be my plan in follow-up anyway. Dayna Dunn PA-C

## 2016-09-16 ENCOUNTER — Encounter: Payer: Self-pay | Admitting: Physician Assistant

## 2016-09-20 ENCOUNTER — Encounter: Payer: Self-pay | Admitting: Physician Assistant

## 2016-09-20 ENCOUNTER — Ambulatory Visit: Payer: Medicare Other | Admitting: Physician Assistant

## 2016-09-20 DIAGNOSIS — I493 Ventricular premature depolarization: Secondary | ICD-10-CM | POA: Insufficient documentation

## 2016-09-20 DIAGNOSIS — I4729 Other ventricular tachycardia: Secondary | ICD-10-CM | POA: Insufficient documentation

## 2016-09-20 DIAGNOSIS — I472 Ventricular tachycardia: Secondary | ICD-10-CM | POA: Insufficient documentation

## 2016-09-20 DIAGNOSIS — I480 Paroxysmal atrial fibrillation: Secondary | ICD-10-CM | POA: Insufficient documentation

## 2016-09-20 DIAGNOSIS — R001 Bradycardia, unspecified: Secondary | ICD-10-CM | POA: Insufficient documentation

## 2016-09-20 DIAGNOSIS — I340 Nonrheumatic mitral (valve) insufficiency: Secondary | ICD-10-CM | POA: Insufficient documentation

## 2016-09-20 NOTE — Progress Notes (Addendum)
Cardiology Office Note    Date:  09/22/2016  ID:  Carol Hardin, DOB 04/14/1933, MRN [id number] PCP:  Leeroy Cha, MD  Cardiologist:  Dr. Meda Coffee   Chief Complaint: second opinion  History of Present Illness:  Carol Hardin is a 82 y.o. female with history of anxiety/depression, arthritis, mitral valve prolapse/moderate mitral regurgitation, GERD, scoliosis, recently diagnosed PVCs, atrial fib, bradycardia, NSVT and cardiomyopathy who presents today to f/u the above. She does not have any history of ischemic heart disease. She was recently admitted on 08/09/2016 with significant abdominal pain and was diagnosed with diverticulitis with an abscess formation treated with antibiotics and slow improvement of symptoms. While she was in the hospital she was hypotensive secondary to sepsis and her bisoprolol has been discontinued. On 08/19/16, she was seen by Dr. Meda Coffee and complained of palpitations and dizziness thus event monitor was ordered. While waiting for placement, 2D echo 08/26/16 showed EF 40%, diffuse HK, ascending aorta 42mm, dilated ascending aorta, posterior mitral valve leaflet prolapse with moderate mitral regurg, mild-mod LAE, mildly reduced RV function, PASP 34. She was noted to be in atrial fib during the echocardiogram. Dr. Meda Coffee instucted her to restart bisoprolol 2.5mg  daily. Event monitor was placed on 08/30/16. She followed up with Ellen Henri to review anticoagulation but the patient refused until she got full results from the monitor. Holter 08/30/16 showed profound bradycardia down to 30 during awake hours, several 2 second pauses, very frequent PVCs >8000 in 48 hours, and NSVT (longest of 14 beats). She was asked to stop bisoprolol. Dr. Meda Coffee recommended Lexiscan nuclear stress test but the patient declined and requested second opinion. Dr. Francesca Oman nurse had contacted me to discuss. I had reviewed her chart and felt it was appropriate for her second opinion  to come from electrophysiology instead. However, the patient insisted on discussing with general cardiology first. She is actually scheduled with Dr. Lovena Le later today as well. Labs 08/2016: K 4.1, Cr 0.91, Mg 1.8, Hgb 10.8, plt 147.  She presents to clinic and has multiple complaints, mostly pertaining to other encounters of care. It [birth year] today. She states she experienced negligence overall during what she calls her hospital confinement. She feels that her abdominal symptoms were overlooked and the focus has all been on her heart. She says she would have been better if we had just left her alone. She is especially upset at the placement of her right antecubital IV in the ER. She states she filled out an extensive survey outlining all the dissatisfaction she experienced. She laments on prior issues she had evaluated in 1995. She was not pleased that [facility] instructed her to stop her beta blocker. She has since restarted it because otherwise she feels her heart racing without it. No chest pain, dyspnea, edema, syncope. She continues to have some LLQ pain. EKG NSR.   Past Medical History:  Diagnosis Date  . Anxiety   . Arthritis    "fingers, right toe" (08/09/2016)  . Basal cell carcinoma of lower leg, right   . Bradycardia    a. Holter 08/30/16 showed profound bradycardia down to 30 during awake hours, several 2 second pauses, very frequent PVCs >8000 in 48 hours, and NSVT (longest of 14 beats).  . Cardiomyopathy ([place])    a. EF 40% by echo 08/2016.  . Daily headache    "I usually wake up w/a headache; sometimes it's a migraine" (08/09/2016)  . Depression   . Diverticulosis    Hx. of  .  Frequent PVCs   . GERD (gastroesophageal reflux disease)   . Hypercholesterolemia   . Migraine   . Mitral regurgitation   . MVP (mitral valve prolapse)   . NSVT (nonsustained ventricular tachycardia) ([place])    a. first noted event monitor 08/2016.  . Osteoporosis   . PAF (paroxysmal atrial  fibrillation) ([place])    a. in afib at time of echo 08/2016.  Marland Kitchen Scoliosis    mild  . Squamous cell carcinoma of neck     Past Surgical History:  Procedure Laterality Date  . BASAL CELL CARCINOMA EXCISION Right    RLE  . BLEPHAROPLASTY    . BREAST BIOPSY Left   . BREAST CYST ASPIRATION Left   . BREAST CYST EXCISION Left   . BUNIONECTOMY Bilateral   . DILATION AND CURETTAGE OF UTERUS    . EXCISIONAL HEMORRHOIDECTOMY    . KNEE ARTHROSCOPY Right 04/12/2007  . KNEE ARTHROSCOPY Left   . SQUAMOUS CELL CARCINOMA EXCISION     "neck"  . TONSILLECTOMY      Current Medications: Current Outpatient Prescriptions  Medication Sig Dispense Refill  . bisoprolol (ZEBETA) 5 MG tablet TAKE 1/2 TABLET BY MOUTH ONCE DAILY    . LORazepam (ATIVAN) 1 MG tablet Take 1 mg by mouth at bedtime.     . saccharomyces boulardii (FLORASTOR) 250 MG capsule Take 1 capsule (250 mg total) by mouth 2 (two) times daily. 40 capsule 0   No current facility-administered medications for this visit.      Allergies:   Omeprazole; Flagyl [metronidazole]; Aripiprazole; Hylan g-f 20; Other; Paroxetine hcl; Toprol xl [metoprolol succinate]; Vancomycin; and Penicillins   Social History   Social History  . Marital status: Married    Spouse name: N/A  . Number of children: N/A  . Years of education: N/A   Social History Main Topics  . Smoking status: Former Smoker    Packs/day: 1.00    Years: 20.00    Types: Cigarettes    Quit date: 1989  . Smokeless tobacco: Never Used  . Alcohol use No  . Drug use: No  . Sexual activity: Not Currently   Other Topics Concern  . None   Social History Narrative  . None     Family History:  The patient's family history includes Heart failure in her father and mother; Leukemia in her mother.   ROS:   Please see the history of present illness. No recurrent fevers or chills All other systems are reviewed and otherwise negative.    PHYSICAL EXAM:   VS:  BP 110/64   Pulse  (!) 53   Ht 5\' 5"  (1.651 m)   Wt 159 lb (72.1 kg)   BMI 26.46 kg/m   BMI: Body mass index is 26.46 kg/m. GEN: Well nourished, well developed WF in no acute distress  HEENT: normocephalic, atraumatic Neck: no JVD, carotid bruits, or masses Cardiac: RRR; soft SEM, split S2. No rubs or gallops, no edema  Respiratory:  clear to auscultation bilaterally, normal work of breathing GI: soft, nontender, nondistended, + BS MS: no deformity or atrophy  Skin: warm and dry, no rash Neuro:  Alert and Oriented x 3, Strength and sensation are intact, follows commands Psych: euthymic mood, full affect  Wt Readings from Last 3 Encounters:  09/22/16 159 lb (72.1 kg)  09/01/16 157 lb 2 oz (71.3 kg)  08/19/16 161 lb (73 kg)      Studies/Labs Reviewed:   EKG:  EKG was ordered today and  personally reviewed by me and demonstrates sinus bradycardia with sinus arrhythmia, QTc 432ms, no acute ST-T changes, QTc 432ms.  Recent Labs: 08/10/2016: ALT 12 08/11/2016: Hemoglobin 10.8; Platelets 147 08/12/2016: BUN <5; Creatinine, Ser 0.91; Magnesium 1.8; Potassium 4.1; Sodium 140   Lipid Panel No results found for: CHOL, TRIG, HDL, CHOLHDL, VLDL, LDLCALC, LDLDIRECT  Additional studies/ records that were reviewed today include: Summarized above.    ASSESSMENT & PLAN:   1. Cardiomyopathy - spent an extensive amount of time in clinic today outlining what this means and why Dr. Meda Coffee recommended further workup. Ms. Manganello seems to have generally poor insight into her cardiac issues. She intermittently brings up problems she was told she had in 1995. I spent time explaining the two separate issues at play currently - structural abnormality as well as multiple rhythm disturbances. I agree with Dr. Meda Coffee that the patient needs ischemic workup. However, she declined at this time and prefers to discuss with Dr. Lovena Le. She kept the appointment that was still scheduled for today. Medication titration is also  indicated but the patient is extremely hesitant to add any new agents to her regimen. She also reports that her blood pressure tends to run low. I also told her it is certainly always her decision about whether she would like to proceed with any workup at all, but our job is to provide the appropriate recommendations for her clinical situation. 2. Multiple rhythm abnormalities including paroxysmal atrial fib (identified on recent echo), bradycardia into the 30s, frequent PVCs and NSVT - see above. Will recheck lytes today. She would like to speak to Dr. Lovena Le to obtain a second opinion before proceeding with any further workup or initiation of anticoagulation. 3. MVP with MR - noted on recent echo. 4. Abdominal pain - exam benign. Check CBC to assess WBC. Instructed her to contact PCP today to follow up.  Disposition: The patient was originally instructed to f/u with EP. This appointment is actually today (see above).   Medication Adjustments/Labs and Tests Ordered: Current medicines are reviewed at length with the patient today.  Concerns regarding medicines are outlined above. Medication changes, Labs and Tests ordered today are summarized above and listed in the Patient Instructions accessible in Encounters.   Carol Ache PA-C  09/22/2016 8:58 AM    [address], [place], [place]  [address] Phone: [phone]; Fax: [phone]

## 2016-09-22 ENCOUNTER — Encounter: Payer: Self-pay | Admitting: Physician Assistant

## 2016-09-22 ENCOUNTER — Ambulatory Visit (INDEPENDENT_AMBULATORY_CARE_PROVIDER_SITE_OTHER): Payer: Medicare Other | Admitting: Internal Medicine

## 2016-09-22 ENCOUNTER — Encounter: Payer: Self-pay | Admitting: Internal Medicine

## 2016-09-22 ENCOUNTER — Ambulatory Visit (INDEPENDENT_AMBULATORY_CARE_PROVIDER_SITE_OTHER): Payer: Medicare Other | Admitting: Physician Assistant

## 2016-09-22 VITALS — BP 110/64 | HR 53 | Ht 65.0 in | Wt 159.0 lb

## 2016-09-22 DIAGNOSIS — I493 Ventricular premature depolarization: Secondary | ICD-10-CM

## 2016-09-22 DIAGNOSIS — I472 Ventricular tachycardia: Secondary | ICD-10-CM

## 2016-09-22 DIAGNOSIS — I340 Nonrheumatic mitral (valve) insufficiency: Secondary | ICD-10-CM

## 2016-09-22 DIAGNOSIS — I429 Cardiomyopathy, unspecified: Secondary | ICD-10-CM

## 2016-09-22 DIAGNOSIS — I341 Nonrheumatic mitral (valve) prolapse: Secondary | ICD-10-CM

## 2016-09-22 DIAGNOSIS — R001 Bradycardia, unspecified: Secondary | ICD-10-CM

## 2016-09-22 DIAGNOSIS — I4729 Other ventricular tachycardia: Secondary | ICD-10-CM

## 2016-09-22 DIAGNOSIS — I480 Paroxysmal atrial fibrillation: Secondary | ICD-10-CM | POA: Diagnosis not present

## 2016-09-22 LAB — CBC WITH DIFFERENTIAL/PLATELET
BASOS ABS: 53 {cells}/uL (ref 0–200)
Basophils Relative: 1 %
Eosinophils Absolute: 212 cells/uL (ref 15–500)
Eosinophils Relative: 4 %
HEMATOCRIT: 40.4 % (ref 35.0–45.0)
HEMOGLOBIN: 13.2 g/dL (ref 11.7–15.5)
LYMPHS ABS: 1696 {cells}/uL (ref 850–3900)
Lymphocytes Relative: 32 %
MCH: 30.8 pg (ref 27.0–33.0)
MCHC: 32.7 g/dL (ref 32.0–36.0)
MCV: 94.2 fL (ref 80.0–100.0)
MONO ABS: 689 {cells}/uL (ref 200–950)
MPV: 11.5 fL (ref 7.5–12.5)
Monocytes Relative: 13 %
NEUTROS PCT: 50 %
Neutro Abs: 2650 cells/uL (ref 1500–7800)
Platelets: 226 10*3/uL (ref 140–400)
RBC: 4.29 MIL/uL (ref 3.80–5.10)
RDW: 13.5 % (ref 11.0–15.0)
WBC: 5.3 10*3/uL (ref 3.8–10.8)

## 2016-09-22 LAB — BASIC METABOLIC PANEL
BUN: 11 mg/dL (ref 7–25)
CHLORIDE: 108 mmol/L (ref 98–110)
CO2: 22 mmol/L (ref 20–31)
Calcium: 9.4 mg/dL (ref 8.6–10.4)
Creat: 0.93 mg/dL — ABNORMAL HIGH (ref 0.60–0.88)
GLUCOSE: 82 mg/dL (ref 65–99)
POTASSIUM: 4.1 mmol/L (ref 3.5–5.3)
Sodium: 144 mmol/L (ref 135–146)

## 2016-09-22 LAB — TSH: TSH: 4.91 m[IU]/L — AB

## 2016-09-22 NOTE — Patient Instructions (Signed)
Medication Instructions:  Your physician recommends that you continue on your current medications as directed. Please refer to the Current Medication list given to you today.   Labwork: None Ordered   Testing/Procedures: None Ordered    Follow-Up: Your physician recommends that you schedule a follow-up appointment in: 3 months with Dr. Taylor   Any Other Special Instructions Will Be Listed Below (If Applicable).     If you need a refill on your cardiac medications before your next appointment, please call your pharmacy.   

## 2016-09-22 NOTE — Patient Instructions (Addendum)
Your physician recommends that you continue on your current medications as directed. Please refer to the Current Medication list given to you today.  Your physician recommends that you return for lab work in: TODAY  BMET [place] CBC TSH    Your physician recommends that you schedule a follow-up appointment in:  [place]

## 2016-09-22 NOTE — Progress Notes (Signed)
HPI Carol Hardin is referred today Dr. Meda Coffee to evaluate atrial arrhythmias, PVC's and sinus node dysfunction. The patient had seen Dr. Mare Ferrari in the past. She has class 2 symptoms but admits to a progressively more sedentary lifestyle although she still cooks. She and her husband note that she has become more sedentary. The patient has not had syncope or near syncope and has minimal palpitations. Evaluation to date suggests that she has PVC's, PAC's and NSVT. She has an EF of 40% in the setting of moderate (at least) MR. She had 2 second pauses during the daytime while awake but did not have much in the way of symptoms. She has been on low dose bisoprolol. She was considered for systemic anti-coagulation. She is not on any as she has refused.  Allergies  Allergen Reactions  . Omeprazole Other (See Comments)    siezure  . Flagyl [Metronidazole] Nausea And Vomiting  . Aripiprazole Other (See Comments)  . Hylan G-F 20 Other (See Comments)  . Other Other (See Comments)  . Paroxetine Hcl Other (See Comments)    Headaches (a long time ago- pt doesn't really remember)  . Toprol Xl [Metoprolol Succinate] Other (See Comments)    Dizziness--pt doesn't really remember   . Vancomycin Other (See Comments)    Pt reports that they gave it too fast- she started having itching in the scalp  . Penicillins Rash     Current Outpatient Prescriptions  Medication Sig Dispense Refill  . bisoprolol (ZEBETA) 5 MG tablet TAKE 1/2 TABLET BY MOUTH ONCE DAILY    . LORazepam (ATIVAN) 1 MG tablet Take 1 mg by mouth at bedtime.     . saccharomyces boulardii (FLORASTOR) 250 MG capsule Take 1 capsule (250 mg total) by mouth 2 (two) times daily. 40 capsule 0   No current facility-administered medications for this visit.      Past Medical History:  Diagnosis Date  . Anxiety   . Arthritis    "fingers, right toe" (08/09/2016)  . Basal cell carcinoma of lower leg, right   . Bradycardia    a. Holter  08/30/16 showed profound bradycardia down to 30 during awake hours, several 2 second pauses, very frequent PVCs >8000 in 48 hours, and NSVT (longest of 14 beats).  . Cardiomyopathy ([place])    a. EF 40% by echo 08/2016.  . Daily headache    "I usually wake up w/a headache; sometimes it's a migraine" (08/09/2016)  . Depression   . Diverticulosis    Hx. of  . Frequent PVCs   . GERD (gastroesophageal reflux disease)   . Hypercholesterolemia   . Migraine   . Mitral regurgitation   . MVP (mitral valve prolapse)   . NSVT (nonsustained ventricular tachycardia) ([place])    a. first noted event monitor 08/2016.  . Osteoporosis   . PAF (paroxysmal atrial fibrillation) ([place])    a. in afib at time of echo 08/2016.  Marland Kitchen Scoliosis    mild  . Squamous cell carcinoma of neck     ROS:   All systems reviewed and negative except as noted in the HPI.   Past Surgical History:  Procedure Laterality Date  . BASAL CELL CARCINOMA EXCISION Right    RLE  . BLEPHAROPLASTY    . BREAST BIOPSY Left   . BREAST CYST ASPIRATION Left   . BREAST CYST EXCISION Left   . BUNIONECTOMY Bilateral   . DILATION AND CURETTAGE OF UTERUS    . EXCISIONAL HEMORRHOIDECTOMY    .  KNEE ARTHROSCOPY Right 04/12/2007  . KNEE ARTHROSCOPY Left   . SQUAMOUS CELL CARCINOMA EXCISION     "neck"  . TONSILLECTOMY       Family History  Problem Relation Age of Onset  . Heart failure Mother   . Leukemia Mother   . Heart failure Father      Social History   Social History  . Marital status: Married    Spouse name: N/A  . Number of children: N/A  . Years of education: N/A   Occupational History  . Not on file.   Social History Main Topics  . Smoking status: Former Smoker    Packs/day: 1.00    Years: 20.00    Types: Cigarettes    Quit date: 1989  . Smokeless tobacco: Never Used  . Alcohol use No  . Drug use: No  . Sexual activity: Not Currently   Other Topics Concern  . Not on file   Social History Narrative  .  No narrative on file     BP 110/64   Pulse (!) 53   Ht 5\' 5"  (1.651 m)   Wt 159 lb (72.1 kg)   BMI 26.46 kg/m   Physical Exam:  Well appearing 82 yo woman, NAD HEENT: Unremarkable Neck:  6 cm JVD, no thyromegally Lymphatics:  No adenopathy Back:  No CVA tenderness Lungs:  Clear with no wheezes HEART:  Regular rate rhythm, no murmurs, no rubs, no clicks Abd:  soft, positive bowel sounds, no organomegally, no rebound, no guarding Ext:  2 plus pulses, no edema, no cyanosis, no clubbing Skin:  No rashes no nodules Neuro:  CN II through XII intact, motor grossly intact  Cardiac monitor - reviewed  Assess/Plan: 1. PVC's/PAC's - she is minimally symptomatic. I have explained the benign nature of her symptoms and recommended watchful waiting. 2. Possible atrial fib - we do not have good documentation although she was said to be in atrial fib during her echo. She had no other documented episodes. We briefly discussed an ILR. She is not interested. 3. Mitral regurgitation - it is described as moderate. I do not think she is a surgical candidate. 4. Chronic systolic heart failure - her symptoms are class 2. She will continue her current meds. She is not a candidate for after load reduction as her blood pressure tends to run too low.  5. Sinus node dysfunction - she is currently asymptomatic. I discussed the warning signs and she will call us if she develops symptoms.  Mikle Bosworth.D.

## 2016-09-23 LAB — MAGNESIUM: Magnesium: 2.2 mg/dL (ref 1.5–2.5)

## 2016-09-24 ENCOUNTER — Telehealth: Payer: Self-pay

## 2016-09-24 NOTE — Telephone Encounter (Signed)
Pt aware of her thyroid level

## 2016-09-24 NOTE — Telephone Encounter (Signed)
Pt called and would like her thyroid results please

## 2016-09-27 ENCOUNTER — Telehealth: Payer: Self-pay | Admitting: Internal Medicine

## 2016-09-27 NOTE — Telephone Encounter (Signed)
Carol Hardin( Dr. Vertis Kelch) is calling to find out if the Zetia was discontinue . According to the patient it was stopped while in the hospital and the patient is not sure if she should continue taking the medication .Marland Kitchen Please call   Thanks

## 2016-09-28 ENCOUNTER — Telehealth: Payer: Self-pay | Admitting: Internal Medicine

## 2016-09-28 NOTE — Telephone Encounter (Signed)
Reiterated to the pt that Dr Meda Coffee advised for her to stop her Bisoprolol back when we received her holter monitor results and her HR showed 30 bpm.  Pt was very upset with this and request that Dr Lovena Le advise on whether she should be on bisoprolol or not.  Informed the pt that this medication has been removed from her med list, but I will route her concerns back to Dr Lovena Le and Nurse to follow-up on.  Pt education provided on why its contraindicated for her to take bisoprolol with bradycardia.  Pt then hung the phone up.

## 2016-09-28 NOTE — Telephone Encounter (Signed)
Returned call to Carol Hardin and let her know that it looks as though Dr Radford Pax has stopped her Bisoprolol due to bradycardia. Prior to discontinuing it had been decreased to 2.5 mg daily.  I have asked that if she had further questions in regards to this to call back.

## 2016-09-28 NOTE — Telephone Encounter (Signed)
Called, spoke with pt. Pt would like clarification if she should be taking Bisoprolol. Pt stated Dr. Lovena Le did not inform to stop Bisoprolol at appt on 09/22/16, but Dr. Meda Coffee informed to stop. Advised I would forward to both Dr. Lovena Le and Dr. Meda Coffee for clarification.

## 2016-09-28 NOTE — Telephone Encounter (Signed)
New Message:    Pt saw Dr Lovena Le on 09-22-16,she is having problems getting her medicine now.

## 2016-10-06 NOTE — Telephone Encounter (Signed)
I did not stop the Bisoprolol when I saw the patient because she was asymptomatic despite her bradycardia. I guess it would be reasonable to stop the Beta blocker and start a low dose of an ACE inhibitor.  I will defer to Dr. Meda Coffee on the heart failure side of things. GT

## 2016-10-13 NOTE — Telephone Encounter (Signed)
Carol Hardin, Could you call her? I have stopped her bisoprolol as she had bradycardia down to 30'.  Ask her how she feels and how is her BP. Her next appointment is not till March but we can bring her earlier. Thank you, [place] Siren

## 2016-10-14 NOTE — Telephone Encounter (Signed)
Left a message for the pt to call back, to offer a sooner appt in January, and discuss Dr Francesca Oman and Dr Tanna Furry recommendations regarding her discontinued beta blocker.

## 2016-10-26 NOTE — Telephone Encounter (Signed)
Have attempted to call the pt multiple times with no return call back.  Will close this encounter and refer to it as needed.

## 2016-12-17 ENCOUNTER — Other Ambulatory Visit: Payer: Self-pay | Admitting: Gastroenterology

## 2016-12-28 ENCOUNTER — Ambulatory Visit: Payer: Medicare Other | Admitting: Internal Medicine

## 2017-01-28 ENCOUNTER — Ambulatory Visit (HOSPITAL_COMMUNITY): Payer: Medicare Other | Admitting: Psychiatry

## 2017-02-01 ENCOUNTER — Ambulatory Visit (HOSPITAL_COMMUNITY): Payer: Medicare Other | Admitting: Psychiatry

## 2017-02-08 ENCOUNTER — Encounter (HOSPITAL_COMMUNITY): Payer: Self-pay

## 2017-02-08 ENCOUNTER — Ambulatory Visit (HOSPITAL_COMMUNITY): Admit: 2017-02-08 | Payer: Medicare Other | Admitting: Gastroenterology

## 2017-02-08 SURGERY — COLONOSCOPY WITH PROPOFOL
Anesthesia: Monitor Anesthesia Care

## 2017-04-07 ENCOUNTER — Encounter: Payer: Self-pay | Admitting: Podiatry

## 2017-04-07 ENCOUNTER — Ambulatory Visit (INDEPENDENT_AMBULATORY_CARE_PROVIDER_SITE_OTHER): Payer: Medicare Other | Admitting: Podiatry

## 2017-04-07 DIAGNOSIS — L84 Corns and callosities: Secondary | ICD-10-CM | POA: Diagnosis not present

## 2017-04-07 DIAGNOSIS — M2042 Other hammer toe(s) (acquired), left foot: Secondary | ICD-10-CM | POA: Diagnosis not present

## 2017-04-07 NOTE — Progress Notes (Signed)
Subjective:    Patient ID: Carol Hardin, female   DOB: 83 y.o.   MRN: [id number]   HPI patient presents with painful left fourth toe stating that hurts when she walks. States is been present for several months    ROS      Objective:  Physical Exam neurovascular status intact with keratotic lesion of the fourth digit left distal that is very painful when pressed     Assessment:   Hammertoe deformity fourth digit left distal with pain      Plan:    Reviewed condition and did deep debridement of lesion with no iatrogenic bleeding applied Band-Aid to the area and dispensed buttress pad and we'll see back when needed. Educated patient on hammertoe deformity

## 2017-07-01 DIAGNOSIS — T161XXA Foreign body in right ear, initial encounter: Secondary | ICD-10-CM | POA: Insufficient documentation

## 2017-07-01 DIAGNOSIS — T162XXA Foreign body in left ear, initial encounter: Secondary | ICD-10-CM | POA: Insufficient documentation

## 2017-07-01 DIAGNOSIS — H9113 Presbycusis, bilateral: Secondary | ICD-10-CM | POA: Insufficient documentation

## 2017-08-03 ENCOUNTER — Other Ambulatory Visit: Payer: Self-pay | Admitting: Physician Assistant

## 2017-08-03 DIAGNOSIS — R1032 Left lower quadrant pain: Secondary | ICD-10-CM

## 2017-08-03 DIAGNOSIS — R9389 Abnormal findings on diagnostic imaging of other specified body structures: Secondary | ICD-10-CM

## 2017-08-03 DIAGNOSIS — Z8719 Personal history of other diseases of the digestive system: Secondary | ICD-10-CM

## 2017-08-08 ENCOUNTER — Inpatient Hospital Stay
Admission: RE | Admit: 2017-08-08 | Discharge: 2017-08-08 | Disposition: A | Discharge status: Home / Self Care | Payer: Medicare Other | Source: Ambulatory Visit | Attending: Physician Assistant | Admitting: Physician Assistant

## 2017-08-10 ENCOUNTER — Ambulatory Visit
Admission: RE | Admit: 2017-08-10 | Discharge: 2017-08-10 | Disposition: A | Discharge status: Home / Self Care | Payer: Medicare Other | Source: Ambulatory Visit | Attending: Cardiology | Admitting: Cardiology

## 2017-08-10 ENCOUNTER — Other Ambulatory Visit: Payer: Self-pay | Admitting: Cardiology

## 2017-08-10 DIAGNOSIS — R0789 Other chest pain: Secondary | ICD-10-CM

## 2017-08-17 ENCOUNTER — Ambulatory Visit (HOSPITAL_COMMUNITY): Payer: Medicare Other | Admitting: Psychiatry

## 2017-08-17 ENCOUNTER — Encounter (HOSPITAL_COMMUNITY): Payer: Self-pay | Admitting: Psychiatry

## 2017-08-17 VITALS — BP 122/70 | HR 68 | Ht 65.0 in | Wt 153.8 lb

## 2017-08-17 DIAGNOSIS — R454 Irritability and anger: Secondary | ICD-10-CM

## 2017-08-17 DIAGNOSIS — Z87891 Personal history of nicotine dependence: Secondary | ICD-10-CM | POA: Diagnosis not present

## 2017-08-17 DIAGNOSIS — F339 Major depressive disorder, recurrent, unspecified: Secondary | ICD-10-CM | POA: Diagnosis not present

## 2017-08-17 MED ORDER — SERTRALINE HCL 20 MG/ML PO CONC: 25.0000 mg | Freq: Every day | ORAL | Status: DC

## 2017-08-17 MED ORDER — SERTRALINE HCL 50 MG PO TABS: 50.0000 mg | ORAL_TABLET | Freq: Every day | ORAL | 2 refills | Status: DC

## 2017-08-17 MED ORDER — LORAZEPAM 1 MG PO TABS: 1.0000 mg | ORAL_TABLET | Freq: Every day | ORAL | 3 refills | Status: DC

## 2017-08-17 NOTE — Progress Notes (Signed)
Psychiatric Initial Adult Assessment   Patient Identification: Carol Hardin MRN:  [id number] Date of Evaluation:  08/17/2017 Referral Source: Dr. Wynonia Lawman Chief Complaint:   Visit Diagnosis: No diagnosis found.  History of Present Illness:    This patient is an 83 year old white married motherwho has a past psychiatric history. The patient was taking Lexapro but stopped it on her own many months ago. The patient is seen multiple psychiatrists. Presently she's having a dysfunctional marriage. His her second marriage of 35 years. She is having problems communicating and negotiating. She is for signs 4 grandchildren and is worried about all. She says all of them have problems in her way. The patient recently had a significant medical problem recently treated for diverticulitis got admitted and became delirious. During the episode clear she was psychotic. She stated the hospital for about a week and then was discharged home. At this time her diverticulitis is resolved. Unfortunately the patient had a conflictual relationship with her cardiologist's office and their PA. Eventually she change care Dr. Wynonia Lawman. At this time the patient describes persistent daily depression for well over year which is worsened. Is not completely clear why it is worse at this time. She actually is sleeping fairly well taking Ativan 1 mg at night. She's eating well has good energy and can think and concentrate well. She says she has impingement tantrums get angry and irritable easily. The patient is having problems enjoying life. She clearly seems to be more anhedonic. The patient denies being suicidal at this time. He has made a suicide attempt back in 1969 with an overdose. The patient denies any psychotic symptoms at this time. She's never very much alcohol at all. She denies any episode of mania. She denies symptoms of generalized anxiety disorder, panic disorderor obsessive-compulsive disorder. The patient is been  psychiatrically hospitalized [address] in [place] and one in 1980.she was hospitalized in [place].patient is seen multiple psychiatrists including Dr. Burna Sis and Dr. Andi Devon been on a number of different antidepressants and remembers taking Abilify point. The patient is never been a continuous psychotherapy.  Associated Signs/Symptoms: Depression Symptoms:  depressed mood, (Hypo) Manic Symptoms:  Irritable Mood, Anxiety Symptoms:   Psychotic Symptoms:   PTSD Symptoms:   Past Psychiatric History: 2 psychiatric hospitalizations multiple antidepressants multiple psychiatrists  Previous Psychotropic Medications: yes Ativan 1 mg  Substance Abuse History in the last 12 months:    Consequences of Substance Abuse:   Past Medical History:  Past Medical History:  Diagnosis Date  . Anxiety   . Arthritis    "fingers, right toe" (08/09/2016)  . Basal cell carcinoma of lower leg, right   . Bradycardia    a. Holter 08/30/16 showed profound bradycardia down to 30 during awake hours, several 2 second pauses, very frequent PVCs >8000 in 48 hours, and NSVT (longest of 14 beats).  . Cardiomyopathy ([place])    a. EF 40% by echo 08/2016.  . Daily headache    "I usually wake up w/a headache; sometimes it's a migraine" (08/09/2016)  . Depression   . Diverticulosis    Hx. of  . Frequent PVCs   . GERD (gastroesophageal reflux disease)   . Hypercholesterolemia   . Migraine   . Mitral regurgitation   . MVP (mitral valve prolapse)   . NSVT (nonsustained ventricular tachycardia) ([place])    a. first noted event monitor 08/2016.  . Osteoporosis   . PAF (paroxysmal atrial fibrillation) ([facility])    a. in afib at time of echo  08/2016.  Marland Kitchen Scoliosis    mild  . Squamous cell carcinoma of neck     Past Surgical History:  Procedure Laterality Date  . BASAL CELL CARCINOMA EXCISION Right    RLE  . BLEPHAROPLASTY    . BREAST BIOPSY Left   . BREAST CYST ASPIRATION Left   . BREAST CYST EXCISION Left   .  BUNIONECTOMY Bilateral   . DILATION AND CURETTAGE OF UTERUS    . EXCISIONAL HEMORRHOIDECTOMY    . KNEE ARTHROSCOPY Right 04/12/2007  . KNEE ARTHROSCOPY Left   . SQUAMOUS CELL CARCINOMA EXCISION     "neck"  . TONSILLECTOMY      Family Psychiatric History:   Family History:  Family History  Problem Relation Age of Onset  . Heart failure Mother   . Leukemia Mother   . Heart failure Father     Social History:   Social History   Socioeconomic History  . Marital status: Married    Spouse name: None  . Number of children: None  . Years of education: None  . Highest education level: None  Social Needs  . Financial resource strain: Somewhat hard  . Food insecurity - worry: Never true  . Food insecurity - inability: Never true  . Transportation needs - medical: No  . Transportation needs - non-medical: No  Occupational History  . None  Tobacco Use  . Smoking status: Former Smoker    Packs/day: 1.00    Years: 20.00    Pack years: 20.00    Types: Cigarettes    Last attempt to quit: 1989    Years since quitting: 29.8  . Smokeless tobacco: Never Used  Substance and Sexual Activity  . Alcohol use: No  . Drug use: No  . Sexual activity: Not Currently  Other Topics Concern  . None  Social History Narrative  . None    Additional Social History:   Allergies:   Allergies  Allergen Reactions  . Omeprazole Other (See Comments)    siezure  . Flagyl [Metronidazole] Nausea And Vomiting  . Aripiprazole Other (See Comments)  . Hylan G-F 20 Other (See Comments)  . Other Other (See Comments)  . Paroxetine Hcl Other (See Comments)    Headaches (a long time ago- pt doesn't really remember)  . Toprol Xl [Metoprolol Succinate] Other (See Comments)    Dizziness--pt doesn't really remember   . Vancomycin Other (See Comments)    Pt reports that they gave it too fast- she started having itching in the scalp  . Penicillins Rash    Metabolic Disorder Labs: No results found for:  HGBA1C, MPG No results found for: PROLACTIN No results found for: CHOL, TRIG, HDL, CHOLHDL, VLDL, LDLCALC   Current Medications: Current Outpatient Medications  Medication Sig Dispense Refill  . bisoprolol (ZEBETA) 5 MG tablet TAKE 1/2 TABLET BY MOUTH EVERY DAY OR AS DIRECTED  2  . LORazepam (ATIVAN) 1 MG tablet Take 1 tablet (1 mg total) at bedtime by mouth. 1  qhs  Half  qam 45 tablet 3  . polyethylene glycol (MIRALAX / GLYCOLAX) packet Take 17 g by mouth daily.    . Probiotic Product (PROBIOTIC PO) Take 1 tablet by mouth as needed.    . saccharomyces boulardii (FLORASTOR) 250 MG capsule Take 1 capsule (250 mg total) by mouth 2 (two) times daily. 40 capsule 0  . Wheat Dextrin (BENEFIBER DRINK MIX PO) Take by mouth.    . sertraline (ZOLOFT) 50 MG tablet Take 1 tablet (  50 mg total) daily by mouth. 30 tablet 2   Current Facility-Administered Medications  Medication Dose Route Frequency Provider Last Rate Last Dose  . sertraline (ZOLOFT) 20 MG/ML concentrated solution 25 mg  25 mg Oral Daily Adya Wirz, MD        Neurologic: Headache: No Seizure: No Paresthesiano  Musculoskeletal: Strength & Muscle Tone: within normal limits Gait & Station: normal Patient leans: Right  Psychiatric Specialty Exam: ROS  Blood pressure 122/70, pulse 68, height 5\' 5"  (1.651 m), weight 153 lb 12.8 oz (69.8 kg).Body mass index is 25.59 kg/m.  General Appearance: Casual  Eye Contact:  Good  Speech:  Normal Rate  Volume:  Normal  Mood:  Depressed  Affect:  Blunt  Thought Process:  Coherent  Orientation:  Full (Time, Place, and Person)  Thought Content:  Logical  Suicidal Thoughts:  No  Homicidal Thoughts:  No  Memory:  Negative  Judgement:  Good  Insight:  Good  Psychomotor Activity:  Normal  Concentration:    Recall:  Good  Fund of Knowledge:Good  Language: Fair  Akathisia:  No  Handed:  Right  AIMS (if indicated):    Assets:  Communication Skills  ADL's:  Intact  Cognition:    Sleep:      Treatment Plan Summary: At this timethis patient clearly has a past history of a depression disorder most likely that of major depression she's not a great historian. She has many issues going on. Namely is related to dysfunctional relationship with her husband and some issues with the hospital involved now lost in the way they got with her when she was ill. The patient is logical and reasonable. She shows no evidence of psychosis. She is somewhat dramatic but she's very injured. I think her relationship with her husband he features historian. This time the best intervention is to return her to taking antidepressant Zoloft 50 mg. The patient also increase her Ativan to taking 1 mg pill half in the morning and one at night. Her #1 problem seems to be a depression/anxiety condition most important intervention besides the medication . For to a therapist in the community. We'll refer her to [facility] patient to return to see me in approximately 2 months.patient is not suicidal at this time. She shows no real vegetative symptoms.   Jerral Ralph, MD 11/7/20184:22 PM

## 2017-08-19 ENCOUNTER — Ambulatory Visit
Admission: RE | Admit: 2017-08-19 | Discharge: 2017-08-19 | Disposition: A | Discharge status: Home / Self Care | Payer: Medicare Other | Source: Ambulatory Visit | Attending: Physician Assistant | Admitting: Physician Assistant

## 2017-08-19 DIAGNOSIS — R1032 Left lower quadrant pain: Secondary | ICD-10-CM

## 2017-08-19 DIAGNOSIS — R9389 Abnormal findings on diagnostic imaging of other specified body structures: Secondary | ICD-10-CM

## 2017-08-19 DIAGNOSIS — Z8719 Personal history of other diseases of the digestive system: Secondary | ICD-10-CM

## 2017-08-19 MED ORDER — IOPAMIDOL (ISOVUE-300) INJECTION 61%
100.0000 mL | Freq: Once | INTRAVENOUS | Status: AC | PRN
  Administered 2017-08-19: 100 mL via INTRAVENOUS

## 2017-10-19 ENCOUNTER — Ambulatory Visit (HOSPITAL_COMMUNITY): Payer: Self-pay | Admitting: Psychiatry

## 2017-12-23 ENCOUNTER — Emergency Department (HOSPITAL_COMMUNITY): Payer: Medicare Other

## 2017-12-23 ENCOUNTER — Emergency Department (HOSPITAL_COMMUNITY)
Admission: EM | Admit: 2017-12-23 | Discharge: 2017-12-23 | Disposition: A | Discharge status: Home / Self Care | Payer: Medicare Other | Attending: Emergency Medicine | Admitting: Emergency Medicine

## 2017-12-23 ENCOUNTER — Encounter (HOSPITAL_COMMUNITY): Payer: Self-pay | Admitting: Emergency Medicine

## 2017-12-23 DIAGNOSIS — Z5321 Procedure and treatment not carried out due to patient leaving prior to being seen by health care provider: Secondary | ICD-10-CM | POA: Diagnosis not present

## 2017-12-23 DIAGNOSIS — W01198A Fall on same level from slipping, tripping and stumbling with subsequent striking against other object, initial encounter: Secondary | ICD-10-CM | POA: Diagnosis not present

## 2017-12-23 DIAGNOSIS — M79641 Pain in right hand: Secondary | ICD-10-CM | POA: Diagnosis not present

## 2017-12-23 DIAGNOSIS — M25562 Pain in left knee: Secondary | ICD-10-CM | POA: Insufficient documentation

## 2017-12-23 DIAGNOSIS — M25561 Pain in right knee: Secondary | ICD-10-CM | POA: Diagnosis present

## 2017-12-23 NOTE — ED Triage Notes (Signed)
Pt reports she fell on neighbors front porch when leaving package due to tripping over raised area that she didn't see. Pt c/o bilat knee pain and right hand pain. Denies any LOC or taking blood thinners.

## 2017-12-31 ENCOUNTER — Encounter (HOSPITAL_COMMUNITY): Payer: Self-pay | Admitting: Anesthesiology

## 2017-12-31 ENCOUNTER — Encounter (HOSPITAL_COMMUNITY)
Admission: EM | Disposition: A | Discharge status: Skilled Nursing Facility | Payer: Self-pay | Source: Home / Self Care | Attending: Internal Medicine

## 2017-12-31 ENCOUNTER — Inpatient Hospital Stay (HOSPITAL_COMMUNITY)
Admission: EM | Admit: 2017-12-31 | Discharge: 2018-01-02 | DRG: 482 | Disposition: A | Discharge status: Skilled Nursing Facility | Payer: Medicare Other | Attending: Internal Medicine | Admitting: Internal Medicine

## 2017-12-31 ENCOUNTER — Other Ambulatory Visit: Payer: Self-pay

## 2017-12-31 ENCOUNTER — Inpatient Hospital Stay (HOSPITAL_COMMUNITY): Payer: Medicare Other | Admitting: Anesthesiology

## 2017-12-31 ENCOUNTER — Inpatient Hospital Stay (HOSPITAL_COMMUNITY): Payer: Medicare Other

## 2017-12-31 ENCOUNTER — Emergency Department (HOSPITAL_COMMUNITY): Payer: Medicare Other

## 2017-12-31 DIAGNOSIS — W19XXXA Unspecified fall, initial encounter: Secondary | ICD-10-CM

## 2017-12-31 DIAGNOSIS — I480 Paroxysmal atrial fibrillation: Secondary | ICD-10-CM | POA: Diagnosis present

## 2017-12-31 DIAGNOSIS — F411 Generalized anxiety disorder: Secondary | ICD-10-CM | POA: Diagnosis present

## 2017-12-31 DIAGNOSIS — I10 Essential (primary) hypertension: Secondary | ICD-10-CM | POA: Diagnosis present

## 2017-12-31 DIAGNOSIS — M810 Age-related osteoporosis without current pathological fracture: Secondary | ICD-10-CM | POA: Diagnosis present

## 2017-12-31 DIAGNOSIS — R079 Chest pain, unspecified: Secondary | ICD-10-CM

## 2017-12-31 DIAGNOSIS — Z792 Long term (current) use of antibiotics: Secondary | ICD-10-CM | POA: Diagnosis not present

## 2017-12-31 DIAGNOSIS — Z880 Allergy status to penicillin: Secondary | ICD-10-CM | POA: Diagnosis not present

## 2017-12-31 DIAGNOSIS — Z79899 Other long term (current) drug therapy: Secondary | ICD-10-CM

## 2017-12-31 DIAGNOSIS — M19071 Primary osteoarthritis, right ankle and foot: Secondary | ICD-10-CM | POA: Diagnosis present

## 2017-12-31 DIAGNOSIS — Z881 Allergy status to other antibiotic agents status: Secondary | ICD-10-CM

## 2017-12-31 DIAGNOSIS — K219 Gastro-esophageal reflux disease without esophagitis: Secondary | ICD-10-CM | POA: Diagnosis present

## 2017-12-31 DIAGNOSIS — M19049 Primary osteoarthritis, unspecified hand: Secondary | ICD-10-CM | POA: Diagnosis present

## 2017-12-31 DIAGNOSIS — Z87891 Personal history of nicotine dependence: Secondary | ICD-10-CM | POA: Diagnosis not present

## 2017-12-31 DIAGNOSIS — F329 Major depressive disorder, single episode, unspecified: Secondary | ICD-10-CM | POA: Diagnosis present

## 2017-12-31 DIAGNOSIS — Z806 Family history of leukemia: Secondary | ICD-10-CM

## 2017-12-31 DIAGNOSIS — S72002A Fracture of unspecified part of neck of left femur, initial encounter for closed fracture: Secondary | ICD-10-CM

## 2017-12-31 DIAGNOSIS — Z85828 Personal history of other malignant neoplasm of skin: Secondary | ICD-10-CM

## 2017-12-31 DIAGNOSIS — Z888 Allergy status to other drugs, medicaments and biological substances status: Secondary | ICD-10-CM

## 2017-12-31 DIAGNOSIS — S72142A Displaced intertrochanteric fracture of left femur, initial encounter for closed fracture: Secondary | ICD-10-CM | POA: Diagnosis present

## 2017-12-31 DIAGNOSIS — I340 Nonrheumatic mitral (valve) insufficiency: Secondary | ICD-10-CM | POA: Diagnosis present

## 2017-12-31 DIAGNOSIS — S72009A Fracture of unspecified part of neck of unspecified femur, initial encounter for closed fracture: Secondary | ICD-10-CM | POA: Diagnosis present

## 2017-12-31 DIAGNOSIS — W010XXA Fall on same level from slipping, tripping and stumbling without subsequent striking against object, initial encounter: Secondary | ICD-10-CM | POA: Diagnosis present

## 2017-12-31 DIAGNOSIS — S7222XA Displaced subtrochanteric fracture of left femur, initial encounter for closed fracture: Secondary | ICD-10-CM | POA: Diagnosis present

## 2017-12-31 DIAGNOSIS — R001 Bradycardia, unspecified: Secondary | ICD-10-CM | POA: Diagnosis present

## 2017-12-31 DIAGNOSIS — E7800 Pure hypercholesterolemia, unspecified: Secondary | ICD-10-CM | POA: Diagnosis present

## 2017-12-31 DIAGNOSIS — Z419 Encounter for procedure for purposes other than remedying health state, unspecified: Secondary | ICD-10-CM

## 2017-12-31 DIAGNOSIS — Z8249 Family history of ischemic heart disease and other diseases of the circulatory system: Secondary | ICD-10-CM

## 2017-12-31 DIAGNOSIS — M419 Scoliosis, unspecified: Secondary | ICD-10-CM | POA: Diagnosis present

## 2017-12-31 HISTORY — PX: INTRAMEDULLARY (IM) NAIL INTERTROCHANTERIC: SHX5875

## 2017-12-31 LAB — CBC WITH DIFFERENTIAL/PLATELET
Basophils Absolute: 0 10*3/uL (ref 0.0–0.1)
Basophils Relative: 0 %
EOS PCT: 1 %
Eosinophils Absolute: 0.1 10*3/uL (ref 0.0–0.7)
HEMATOCRIT: 37.6 % (ref 36.0–46.0)
Hemoglobin: 12.3 g/dL (ref 12.0–15.0)
LYMPHS ABS: 1.4 10*3/uL (ref 0.7–4.0)
LYMPHS PCT: 16 %
MCH: 31.3 pg (ref 26.0–34.0)
MCHC: 32.7 g/dL (ref 30.0–36.0)
MCV: 95.7 fL (ref 78.0–100.0)
MONO ABS: 0.5 10*3/uL (ref 0.1–1.0)
Monocytes Relative: 5 %
NEUTROS ABS: 6.9 10*3/uL (ref 1.7–7.7)
Neutrophils Relative %: 78 %
PLATELETS: 208 10*3/uL (ref 150–400)
RBC: 3.93 MIL/uL (ref 3.87–5.11)
RDW: 14 % (ref 11.5–15.5)
WBC: 8.9 10*3/uL (ref 4.0–10.5)

## 2017-12-31 LAB — BASIC METABOLIC PANEL
Anion gap: 9 (ref 5–15)
BUN: 11 mg/dL (ref 6–20)
CO2: 24 mmol/L (ref 22–32)
CREATININE: 1.01 mg/dL — AB (ref 0.44–1.00)
Calcium: 9.3 mg/dL (ref 8.9–10.3)
Chloride: 105 mmol/L (ref 101–111)
GFR calc non Af Amer: 50 mL/min — ABNORMAL LOW (ref >60)
GFR, EST AFRICAN AMERICAN: 58 mL/min — AB (ref >60)
Glucose, Bld: 93 mg/dL (ref 65–99)
POTASSIUM: 4 mmol/L (ref 3.5–5.1)
SODIUM: 138 mmol/L (ref 135–145)

## 2017-12-31 LAB — TYPE AND SCREEN
ABO/RH(D): A POS
Antibody Screen: NEGATIVE

## 2017-12-31 LAB — ABO/RH: ABO/RH(D): A POS

## 2017-12-31 LAB — I-STAT TROPONIN, ED: TROPONIN I, POC: 0 ng/mL (ref 0.00–0.08)

## 2017-12-31 LAB — TROPONIN I: Troponin I: <0.03 ng/mL (ref <0.03)

## 2017-12-31 LAB — PROTIME-INR
INR: 0.98
PROTHROMBIN TIME: 12.9 s (ref 11.4–15.2)

## 2017-12-31 SURGERY — FIXATION, FRACTURE, INTERTROCHANTERIC, WITH INTRAMEDULLARY ROD
Anesthesia: General | Site: Hip | Laterality: Left

## 2017-12-31 MED ORDER — SERTRALINE HCL 20 MG/ML PO CONC
25.0000 mg | Freq: Every day | ORAL | Status: DC
  Filled 2017-12-31 (×2): qty 1.25

## 2017-12-31 MED ORDER — HYDROCODONE-ACETAMINOPHEN 5-325 MG PO TABS: 1.0000 | ORAL_TABLET | Freq: Four times a day (QID) | ORAL | 0 refills | Status: DC | PRN

## 2017-12-31 MED ORDER — ACETAMINOPHEN 325 MG PO TABS
325.0000 mg | ORAL_TABLET | Freq: Four times a day (QID) | ORAL | Status: DC | PRN
  Administered 2018-01-02: 650 mg via ORAL
  Filled 2017-12-31: qty 2

## 2017-12-31 MED ORDER — HYDROMORPHONE HCL 1 MG/ML IJ SOLN
INTRAMUSCULAR | Status: AC
  Administered 2017-12-31: 0.25 mg via INTRAVENOUS
  Filled 2017-12-31: qty 1

## 2017-12-31 MED ORDER — POLYETHYLENE GLYCOL 3350 17 G PO PACK: 17.0000 g | PACK | Freq: Every day | ORAL | Status: DC | PRN

## 2017-12-31 MED ORDER — LORAZEPAM 1 MG PO TABS
1.0000 mg | ORAL_TABLET | Freq: Every day | ORAL | Status: DC
  Administered 2017-12-31 – 2018-01-01 (×2): 1 mg via ORAL
  Filled 2017-12-31 (×2): qty 1

## 2017-12-31 MED ORDER — ONDANSETRON HCL 4 MG PO TABS: 4.0000 mg | ORAL_TABLET | Freq: Four times a day (QID) | ORAL | Status: DC | PRN

## 2017-12-31 MED ORDER — METOCLOPRAMIDE HCL 5 MG/ML IJ SOLN: 5.0000 mg | Freq: Three times a day (TID) | INTRAMUSCULAR | Status: DC | PRN

## 2017-12-31 MED ORDER — LACTATED RINGERS IV SOLN
INTRAVENOUS | Status: DC | PRN
  Administered 2017-12-31: 18:00:00 via INTRAVENOUS

## 2017-12-31 MED ORDER — HYDROCODONE-ACETAMINOPHEN 5-325 MG PO TABS: 1.0000 | ORAL_TABLET | Freq: Four times a day (QID) | ORAL | Status: DC | PRN

## 2017-12-31 MED ORDER — HYDROCODONE-ACETAMINOPHEN 5-325 MG PO TABS: 1.0000 | ORAL_TABLET | ORAL | Status: DC | PRN

## 2017-12-31 MED ORDER — ARTIFICIAL TEARS OPHTHALMIC OINT
TOPICAL_OINTMENT | OPHTHALMIC | Status: DC | PRN
  Administered 2017-12-31: 1 via OPHTHALMIC

## 2017-12-31 MED ORDER — POVIDONE-IODINE 10 % EX SWAB: 2.0000 "application " | Freq: Once | CUTANEOUS | Status: DC

## 2017-12-31 MED ORDER — HYDROCODONE-ACETAMINOPHEN 7.5-325 MG PO TABS: 1.0000 | ORAL_TABLET | ORAL | Status: DC | PRN

## 2017-12-31 MED ORDER — ONDANSETRON HCL 4 MG/2ML IJ SOLN
INTRAMUSCULAR | Status: DC | PRN
  Administered 2017-12-31: 4 mg via INTRAVENOUS

## 2017-12-31 MED ORDER — PHENYLEPHRINE HCL 10 MG/ML IJ SOLN
INTRAVENOUS | Status: DC | PRN
  Administered 2017-12-31: 20 ug/min via INTRAVENOUS

## 2017-12-31 MED ORDER — METHOCARBAMOL 1000 MG/10ML IJ SOLN
500.0000 mg | Freq: Four times a day (QID) | INTRAVENOUS | Status: DC | PRN
  Filled 2017-12-31: qty 5

## 2017-12-31 MED ORDER — CLINDAMYCIN PHOSPHATE 600 MG/50ML IV SOLN
600.0000 mg | Freq: Four times a day (QID) | INTRAVENOUS | Status: AC
  Administered 2018-01-01 (×3): 600 mg via INTRAVENOUS
  Filled 2017-12-31 (×3): qty 50

## 2017-12-31 MED ORDER — CHLORHEXIDINE GLUCONATE 4 % EX LIQD: 60.0000 mL | Freq: Once | CUTANEOUS | Status: DC

## 2017-12-31 MED ORDER — MORPHINE SULFATE (PF) 4 MG/ML IV SOLN: 0.5000 mg | INTRAVENOUS | Status: DC | PRN

## 2017-12-31 MED ORDER — CLINDAMYCIN PHOSPHATE 900 MG/50ML IV SOLN
INTRAVENOUS | Status: AC
  Filled 2017-12-31: qty 50

## 2017-12-31 MED ORDER — CLINDAMYCIN PHOSPHATE 900 MG/50ML IV SOLN
900.0000 mg | INTRAVENOUS | Status: AC
  Administered 2017-12-31: 900 mg via INTRAVENOUS
  Filled 2017-12-31: qty 50

## 2017-12-31 MED ORDER — PROPOFOL 10 MG/ML IV BOLUS
INTRAVENOUS | Status: DC | PRN
  Administered 2017-12-31: 110 mg via INTRAVENOUS

## 2017-12-31 MED ORDER — PROPOFOL 10 MG/ML IV BOLUS
INTRAVENOUS | Status: AC
  Filled 2017-12-31: qty 20

## 2017-12-31 MED ORDER — VITAMIN D 1000 UNITS PO TABS
2000.0000 [IU] | ORAL_TABLET | Freq: Every day | ORAL | Status: DC
  Administered 2018-01-01 – 2018-01-02 (×2): 2000 [IU] via ORAL
  Filled 2017-12-31 (×2): qty 2

## 2017-12-31 MED ORDER — ONDANSETRON HCL 4 MG/2ML IJ SOLN
4.0000 mg | Freq: Four times a day (QID) | INTRAMUSCULAR | Status: DC | PRN
  Administered 2018-01-02: 4 mg via INTRAVENOUS
  Filled 2017-12-31: qty 2

## 2017-12-31 MED ORDER — LIDOCAINE HCL (CARDIAC) 20 MG/ML IV SOLN
INTRAVENOUS | Status: DC | PRN
  Administered 2017-12-31: 100 mg via INTRAVENOUS

## 2017-12-31 MED ORDER — METHOCARBAMOL 500 MG PO TABS: 500.0000 mg | ORAL_TABLET | Freq: Four times a day (QID) | ORAL | Status: DC | PRN

## 2017-12-31 MED ORDER — ONDANSETRON HCL 4 MG/2ML IJ SOLN: 4.0000 mg | Freq: Once | INTRAMUSCULAR | Status: DC | PRN

## 2017-12-31 MED ORDER — MORPHINE SULFATE (PF) 2 MG/ML IV SOLN: 0.5000 mg | INTRAVENOUS | Status: DC | PRN

## 2017-12-31 MED ORDER — HYDROMORPHONE HCL 1 MG/ML IJ SOLN
0.2500 mg | INTRAMUSCULAR | Status: DC | PRN
  Administered 2017-12-31 (×2): 0.25 mg via INTRAVENOUS

## 2017-12-31 MED ORDER — EPHEDRINE SULFATE 50 MG/ML IJ SOLN
INTRAMUSCULAR | Status: DC | PRN
  Administered 2017-12-31 (×3): 5 mg via INTRAVENOUS

## 2017-12-31 MED ORDER — METOCLOPRAMIDE HCL 5 MG PO TABS: 5.0000 mg | ORAL_TABLET | Freq: Three times a day (TID) | ORAL | Status: DC | PRN

## 2017-12-31 MED ORDER — ACETAMINOPHEN 500 MG PO TABS
500.0000 mg | ORAL_TABLET | Freq: Four times a day (QID) | ORAL | Status: AC
  Administered 2017-12-31 – 2018-01-01 (×4): 500 mg via ORAL
  Filled 2017-12-31 (×4): qty 1

## 2017-12-31 MED ORDER — FENTANYL CITRATE (PF) 100 MCG/2ML IJ SOLN
50.0000 ug | INTRAMUSCULAR | Status: AC | PRN
  Administered 2017-12-31 (×2): 50 ug via INTRAVENOUS
  Filled 2017-12-31 (×2): qty 2

## 2017-12-31 MED ORDER — ORAL CARE MOUTH RINSE
15.0000 mL | Freq: Two times a day (BID) | OROMUCOSAL | Status: DC
  Administered 2018-01-01 – 2018-01-02 (×2): 15 mL via OROMUCOSAL

## 2017-12-31 MED ORDER — FENTANYL CITRATE (PF) 100 MCG/2ML IJ SOLN
INTRAMUSCULAR | Status: DC | PRN
  Administered 2017-12-31: 25 ug via INTRAVENOUS
  Administered 2017-12-31: 100 ug via INTRAVENOUS
  Administered 2017-12-31: 25 ug via INTRAVENOUS

## 2017-12-31 MED ORDER — MEPERIDINE HCL 50 MG/ML IJ SOLN: 6.2500 mg | INTRAMUSCULAR | Status: DC | PRN

## 2017-12-31 MED ORDER — ASPIRIN EC 81 MG PO TBEC: 81.0000 mg | DELAYED_RELEASE_TABLET | Freq: Two times a day (BID) | ORAL | 0 refills | Status: AC

## 2017-12-31 MED ORDER — SUCCINYLCHOLINE CHLORIDE 20 MG/ML IJ SOLN
INTRAMUSCULAR | Status: DC | PRN
  Administered 2017-12-31: 120 mg via INTRAVENOUS

## 2017-12-31 MED ORDER — DOCUSATE SODIUM 100 MG PO CAPS
100.0000 mg | ORAL_CAPSULE | Freq: Two times a day (BID) | ORAL | Status: DC
  Administered 2018-01-01: 100 mg via ORAL
  Filled 2017-12-31 (×2): qty 1

## 2017-12-31 MED ORDER — FENTANYL CITRATE (PF) 250 MCG/5ML IJ SOLN
INTRAMUSCULAR | Status: AC
  Filled 2017-12-31: qty 5

## 2017-12-31 MED ORDER — KETOROLAC TROMETHAMINE 15 MG/ML IJ SOLN
7.5000 mg | Freq: Four times a day (QID) | INTRAMUSCULAR | Status: AC
  Administered 2017-12-31 – 2018-01-01 (×4): 7.5 mg via INTRAVENOUS
  Filled 2017-12-31 (×4): qty 1

## 2017-12-31 SURGICAL SUPPLY — 48 items
BIT DRILL FLUTED FEMUR 4.2/3 (BIT) ×3 IMPLANT
BLADE SURG 15 STRL LF DISP TIS (BLADE) ×1 IMPLANT
BLADE SURG 15 STRL SS (BLADE) ×2
BNDG COHESIVE 4X5 TAN NS LF (GAUZE/BANDAGES/DRESSINGS) ×3 IMPLANT
BNDG COHESIVE 6X5 TAN STRL LF (GAUZE/BANDAGES/DRESSINGS) IMPLANT
BNDG GAUZE ELAST 4 BULKY (GAUZE/BANDAGES/DRESSINGS) ×3 IMPLANT
COVER PERINEAL POST (MISCELLANEOUS) ×3 IMPLANT
COVER SURGICAL LIGHT HANDLE (MISCELLANEOUS) ×3 IMPLANT
DRAPE HALF SHEET 40X57 (DRAPES) IMPLANT
DRAPE INCISE IOBAN 66X45 STRL (DRAPES) ×3 IMPLANT
DRAPE STERI IOBAN 125X83 (DRAPES) ×3 IMPLANT
DRSG PAD ABDOMINAL 8X10 ST (GAUZE/BANDAGES/DRESSINGS) ×6 IMPLANT
DURAPREP 26ML APPLICATOR (WOUND CARE) ×3 IMPLANT
ELECT CAUTERY BLADE 6.4 (BLADE) ×3 IMPLANT
ELECT REM PT RETURN 9FT ADLT (ELECTROSURGICAL) ×3
ELECTRODE REM PT RTRN 9FT ADLT (ELECTROSURGICAL) ×1 IMPLANT
FACESHIELD WRAPAROUND (MASK) ×3 IMPLANT
GAUZE SPONGE 4X4 12PLY STRL (GAUZE/BANDAGES/DRESSINGS) ×3 IMPLANT
GAUZE XEROFORM 5X9 LF (GAUZE/BANDAGES/DRESSINGS) ×3 IMPLANT
GLOVE BIO SURGEON STRL SZ7.5 (GLOVE) ×3 IMPLANT
GLOVE BIOGEL PI IND STRL 7.0 (GLOVE) ×1 IMPLANT
GLOVE BIOGEL PI IND STRL 8 (GLOVE) ×1 IMPLANT
GLOVE BIOGEL PI INDICATOR 7.0 (GLOVE) ×2
GLOVE BIOGEL PI INDICATOR 8 (GLOVE) ×2
GLOVE SURG SS PI 7.0 STRL IVOR (GLOVE) ×3 IMPLANT
GOWN STRL REUS W/ TWL LRG LVL3 (GOWN DISPOSABLE) ×2 IMPLANT
GOWN STRL REUS W/ TWL XL LVL3 (GOWN DISPOSABLE) ×1 IMPLANT
GOWN STRL REUS W/TWL LRG LVL3 (GOWN DISPOSABLE) ×4
GOWN STRL REUS W/TWL XL LVL3 (GOWN DISPOSABLE) ×2
GUIDEWIRE 3.2X400 (WIRE) ×6 IMPLANT
KIT BASIN OR (CUSTOM PROCEDURE TRAY) ×3 IMPLANT
KIT ROOM TURNOVER OR (KITS) ×3 IMPLANT
LINER BOOT UNIVERSAL DISP (MISCELLANEOUS) ×3 IMPLANT
MANIFOLD NEPTUNE II (INSTRUMENTS) ×3 IMPLANT
NAIL TROCH FIX 10X235 LT 130 (Nail) ×3 IMPLANT
NS IRRIG 1000ML POUR BTL (IV SOLUTION) ×3 IMPLANT
PACK GENERAL/GYN (CUSTOM PROCEDURE TRAY) ×3 IMPLANT
PAD ARMBOARD 7.5X6 YLW CONV (MISCELLANEOUS) ×6 IMPLANT
PAD CAST 4YDX4 CTTN HI CHSV (CAST SUPPLIES) ×2 IMPLANT
PADDING CAST COTTON 4X4 STRL (CAST SUPPLIES) ×4
SCREW LOCK T25 FT 36X5X4.3X (Screw) ×2 IMPLANT
SCREW LOCKING 5.0X36MM (Screw) ×4 IMPLANT
SCREW TFNA P5MM STERILE (Screw) ×3 IMPLANT
STAPLER VISISTAT 35W (STAPLE) ×3 IMPLANT
SUT MON AB 2-0 CT1 36 (SUTURE) ×3 IMPLANT
TOWEL OR 17X24 6PK STRL BLUE (TOWEL DISPOSABLE) ×3 IMPLANT
TOWEL OR 17X26 10 PK STRL BLUE (TOWEL DISPOSABLE) ×3 IMPLANT
WATER STERILE IRR 1000ML POUR (IV SOLUTION) ×3 IMPLANT

## 2017-12-31 NOTE — Anesthesia Procedure Notes (Signed)
Procedure Name: Intubation Date/Time: 12/31/2017 6:20 PM Performed by: Suzy Bouchard, CRNA Pre-anesthesia Checklist: Patient identified, Emergency Drugs available, Suction available, Patient being monitored and Timeout performed Patient Re-evaluated:Patient Re-evaluated prior to induction Oxygen Delivery Method: Circle system utilized Preoxygenation: Pre-oxygenation with 100% oxygen Induction Type: IV induction, Rapid sequence and Cricoid Pressure applied Laryngoscope Size: Miller and 2 Grade View: Grade I Tube type: Oral Tube size: 7.0 mm Number of attempts: 1 Airway Equipment and Method: Stylet Placement Confirmation: ETT inserted through vocal cords under direct vision,  positive ETCO2 and breath sounds checked- equal and bilateral Secured at: 20 cm Tube secured with: Tape Dental Injury: Teeth and Oropharynx as per pre-operative assessment

## 2017-12-31 NOTE — Anesthesia Preprocedure Evaluation (Signed)
Anesthesia Evaluation  Patient identified by MRN, date of birth, ID band Patient awake    Reviewed: Allergy & Precautions, NPO status , Patient's Chart, lab work & pertinent test results  Airway Mallampati: I  TM Distance: >3 FB Neck ROM: Full    Dental   Pulmonary former smoker,    Pulmonary exam normal        Cardiovascular hypertension, Pt. on medications Normal cardiovascular exam+ dysrhythmias Atrial Fibrillation      Neuro/Psych  Headaches, Anxiety Depression    GI/Hepatic GERD  Medicated and Controlled,  Endo/Other    Renal/GU      Musculoskeletal   Abdominal   Peds  Hematology   Anesthesia Other Findings   Reproductive/Obstetrics                             Anesthesia Physical Anesthesia Plan  ASA: II  Anesthesia Plan: General   Post-op Pain Management:    Induction:   PONV Risk Score and Plan: 3 and Treatment may vary due to age or medical condition and Ondansetron  Airway Management Planned: Oral ETT  Additional Equipment:   Intra-op Plan:   Post-operative Plan: Extubation in OR  Informed Consent: I have reviewed the patients History and Physical, chart, labs and discussed the procedure including the risks, benefits and alternatives for the proposed anesthesia with the patient or authorized representative who has indicated his/her understanding and acceptance.     Plan Discussed with: CRNA and Surgeon  Anesthesia Plan Comments:         Anesthesia Quick Evaluation

## 2017-12-31 NOTE — ED Notes (Signed)
Istat trop clotted; per EDP add Troponin onto lab work and do not Writer trop. Phleb made aware.

## 2017-12-31 NOTE — Consult Note (Signed)
ORTHOPAEDIC CONSULTATION  REQUESTING PHYSICIAN: Cristal Ford, DO  PCP:  Leeroy Cha, MD  Chief Complaint: Left hip fracture  HPI: Carol Hardin is a 84 y.o. female who complains of left hip pain following a fall earlier today.  She was in her normal state of health and slipped wearing some bedroom shoes.  She fell on her left side.  She had immediate pain and shortening of the left leg.  She presented to the emergency department where she was found to have left hip fracture.  Orthopedic surgery was consulted for definitive care.   She has a fairly benign past medical history and is not on any anticoagulants and does not have diabetes.  She also denies smoking.  She lives independently with her husband and does not require any assistive devices for ambulation.  Past Medical History:  Diagnosis Date  . Anxiety   . Arthritis    "fingers, right toe" (08/09/2016)  . Basal cell carcinoma of lower leg, right   . Bradycardia    a. Holter 08/30/16 showed profound bradycardia down to 30 during awake hours, several 2 second pauses, very frequent PVCs >8000 in 48 hours, and NSVT (longest of 14 beats).  . Cardiomyopathy ([place])    a. EF 40% by echo 08/2016.  . Daily headache    "I usually wake up w/a headache; sometimes it's a migraine" (08/09/2016)  . Depression   . Diverticulosis    Hx. of  . Frequent PVCs   . GERD (gastroesophageal reflux disease)   . Hypercholesterolemia   . Migraine   . Mitral regurgitation   . MVP (mitral valve prolapse)   . NSVT (nonsustained ventricular tachycardia) ([place])    a. first noted event monitor 08/2016.  . Osteoporosis   . PAF (paroxysmal atrial fibrillation) ([place])    a. in afib at time of echo 08/2016.  Marland Kitchen Scoliosis    mild  . Squamous cell carcinoma of neck    Past Surgical History:  Procedure Laterality Date  . BASAL CELL CARCINOMA EXCISION Right    RLE  . BLEPHAROPLASTY    . BREAST BIOPSY Left   . BREAST CYST ASPIRATION  Left   . BREAST CYST EXCISION Left   . BUNIONECTOMY Bilateral   . DILATION AND CURETTAGE OF UTERUS    . EXCISIONAL HEMORRHOIDECTOMY    . KNEE ARTHROSCOPY Right 04/12/2007  . KNEE ARTHROSCOPY Left   . SQUAMOUS CELL CARCINOMA EXCISION     "neck"  . TONSILLECTOMY     Social History   Socioeconomic History  . Marital status: Married    Spouse name: Not on file  . Number of children: Not on file  . Years of education: Not on file  . Highest education level: Not on file  Occupational History  . Not on file  Social Needs  . Financial resource strain: Somewhat hard  . Food insecurity:    Worry: Never true    Inability: Never true  . Transportation needs:    Medical: No    Non-medical: No  Tobacco Use  . Smoking status: Former Smoker    Packs/day: 1.00    Years: 20.00    Pack years: 20.00    Types: Cigarettes    Last attempt to quit: 1989    Years since quitting: 30.2  . Smokeless tobacco: Never Used  Substance and Sexual Activity  . Alcohol use: No  . Drug use: No  . Sexual activity: Not Currently  Lifestyle  . Physical activity:  Days per week: 0 days    Minutes per session: 0 min  . Stress: Rather much  Relationships  . Social connections:    Talks on phone: Once a week    Gets together: Once a week    Attends religious service: Never    Active member of club or organization: No    Attends meetings of clubs or organizations: Never    Relationship status: Married  Other Topics Concern  . Not on file  Social History Narrative  . Not on file   Family History  Problem Relation Age of Onset  . Heart failure Mother   . Leukemia Mother   . Heart failure Father    Allergies  Allergen Reactions  . Omeprazole Other (See Comments)    siezure  . Flagyl [Metronidazole] Nausea And Vomiting  . Aripiprazole Other (See Comments)  . Hylan G-F 20 Other (See Comments)  . Other Other (See Comments)  . Paroxetine Hcl Other (See Comments)    Headaches (a long time ago-  pt doesn't really remember)  . Toprol Xl [Metoprolol Succinate] Other (See Comments)    Dizziness--pt doesn't really remember   . Vancomycin Other (See Comments)    Pt reports that they gave it too fast- she started having itching in the scalp  . Penicillins Rash   Prior to Admission medications   Medication Sig Start Date End Date Taking? Authorizing Provider  azithromycin (ZITHROMAX) 500 MG tablet Take 500 mg by mouth daily. For 7 days 12/27/17  Yes [provider]  bisoprolol (ZEBETA) 5 MG tablet TAKE 1/2 TABLET BY MOUTH EVERY DAY OR AS DIRECTED 03/18/17  Yes [provider]  Cholecalciferol (VITAMIN D) 2000 units CAPS Take 2,000 Units by mouth daily.   Yes [provider]  LORazepam (ATIVAN) 1 MG tablet Take 1 tablet (1 mg total) at bedtime by mouth. 1  qhs  Half  qam 08/17/17  Yes Plovsky, Berneta Sages, MD  polyethylene glycol (MIRALAX / GLYCOLAX) packet Take 17 g by mouth daily as needed for mild constipation.    Yes [provider]  Probiotic Product (PROBIOTIC PO) Take 1 tablet by mouth as needed.   Yes [provider]   Dg Chest 1 View  Result Date: 12/31/2017 CLINICAL DATA:  [address] on front porch onto LEFT hip, slipped due to her slippers, denies loss of consciousness, unable to move LEFT leg due to pain EXAM: CHEST  1 VIEW COMPARISON:  08/10/2017 FINDINGS: Enlargement of cardiac silhouette. Tortuous thoracic aorta. Mediastinal contours and pulmonary vascularity otherwise normal. Lungs clear. No infiltrate, pleural effusion or pneumothorax. Bones unremarkable IMPRESSION: Enlargement of cardiac silhouette. No acute abnormalities. Electronically Signed   By: Lavonia Dana M.D.   On: 12/31/2017 16:07   Dg Knee 1-2 Views Left  Result Date: 12/31/2017 CLINICAL DATA:  Left hip pain following a fall on her front porch. EXAM: LEFT KNEE - 1-2 VIEW COMPARISON:  12/23/2017. FINDINGS: Joint space narrowing and associated spur formation involving the lateral and  patellofemoral compartments. No fracture or dislocation seen. Stable small effusion. IMPRESSION: 1. No fracture or dislocation. 2. Stable degenerative changes and small effusion. Electronically Signed   By: Claudie Revering M.D.   On: 12/31/2017 16:07   Dg Hip Unilat W Or Wo Pelvis 2-3 Views Left  Result Date: 12/31/2017 CLINICAL DATA:  Left hip pain following a fall on her front porch today. EXAM: DG HIP (WITH OR WITHOUT PELVIS) 2-3V LEFT COMPARISON:  Abdomen and pelvis CT dated 08/19/2017.  FINDINGS: Comminuted left intertrochanteric fracture with mild to moderate distraction of the fragments and mild medial displacement of the distal fragment. There is also proximal displacement of the lesser trochanter fragment. IMPRESSION: Comminuted left intertrochanteric fracture. Electronically Signed   By: Claudie Revering M.D.   On: 12/31/2017 16:06    Positive ROS: All other systems have been reviewed and were otherwise negative with the exception of those mentioned in the HPI and as above.  Physical Exam: General: Alert, no acute distress Cardiovascular: No pedal edema Respiratory: No cyanosis, no use of accessory musculature GI: No organomegaly, abdomen is soft and non-tender Skin: No lesions in the area of chief complaint Neurologic: Sensation intact distally Psychiatric: Patient is competent for consent with normal mood and affect Lymphatic: No axillary or cervical lymphadenopathy  MUSCULOSKELETAL:  Left lower extremity is shortened and flexed at the knee and externally rotated.  She has tenderness about the hip girdle.  At the foot and ankle she endorses sensation intact light touch throughout with no deficits.  She has a good 2+ dorsalis pedis and posterior tibialis pulse.  She is able to wiggle her toes and flex the ankle plantarly and dorsally.  Assessment: Closed left intertrochanteric hip fracture  Plan: -Plan for intramedullary stabilization of the left hip fracture. -This is an urgent surgery  as the outcomes dictated following hip fracture surgery are improved with expeditious fixation.  We are going to recommend moving forward with surgery this evening. - The risks, benefits, and alternatives were discussed with the patient. There are risks associated with the surgery including, but not limited to, problems with anesthesia (death), infection, differences in leg length/angulation/rotation, fracture of bones, loosening or failure of implants, malunion, nonunion, hematoma (blood accumulation) which may require surgical drainage, blood clots, pulmonary embolism, nerve injury (foot drop), and blood vessel injury. The patient understands these risks and elects to proceed. -She will return to the medicine floor postoperatively for routine perioperative care.  We do appreciate the medicine team for their excellent inpatient care.    Nicholes Stairs, MD Cell [phone]    12/31/2017 6:04 PM

## 2017-12-31 NOTE — Transfer of Care (Signed)
Immediate Anesthesia Transfer of Care Note  Patient: Carol Hardin  Procedure(s) Performed: INTRAMEDULLARY (IM) NAIL INTERTROCHANTRIC (Left Hip)  Patient Location: PACU  Anesthesia Type:General  Level of Consciousness: awake  Airway & Oxygen Therapy: Patient Spontanous Breathing and Patient connected to nasal cannula oxygen  Post-op Assessment: Report given to RN and Post -op Vital signs reviewed and stable  Post vital signs: Reviewed and stable  Last Vitals:  Vitals Value Taken Time  BP    Temp    Pulse    Resp    SpO2      Last Pain:  Vitals:   12/31/17 1734  TempSrc:   PainSc: 8          Complications: No apparent anesthesia complications

## 2017-12-31 NOTE — ED Provider Notes (Signed)
[facility] EMERGENCY DEPARTMENT Provider Note   CSN: [id number] Arrival date & time: 12/31/17  1432     History   Chief Complaint Chief Complaint  Patient presents with  . Hip Pain  . Fall    HPI Carol Hardin is a 84 y.o. female.  Patient is a 84 year old female who presents with pain to her left hip.  She states she was on her porch and states she slipped on her bedroom slippers and fell over onto her left side.  She had ongoing pain since the fall to her left hip.  This happened just prior to arrival.  She says that she did not hit her head.  She denies any pain to her neck or back.  She denies any other injuries.  She does complain of some chest pain which she describes as a tightness to her lower chest on the left side.  She states this started since she has been in the emergency room.  She feels a little bit short of breath.  She does not complain of pain to her ribs.  She denies any nausea or vomiting.  No headache.  She has not received any pain medication.     Past Medical History:  Diagnosis Date  . Anxiety   . Arthritis    "fingers, right toe" (08/09/2016)  . Basal cell carcinoma of lower leg, right   . Bradycardia    a. Holter 08/30/16 showed profound bradycardia down to 30 during awake hours, several 2 second pauses, very frequent PVCs >8000 in 48 hours, and NSVT (longest of 14 beats).  . Cardiomyopathy ([place])    a. EF 40% by echo 08/2016.  . Daily headache    "I usually wake up w/a headache; sometimes it's a migraine" (08/09/2016)  . Depression   . Diverticulosis    Hx. of  . Frequent PVCs   . GERD (gastroesophageal reflux disease)   . Hypercholesterolemia   . Migraine   . Mitral regurgitation   . MVP (mitral valve prolapse)   . NSVT (nonsustained ventricular tachycardia) ([place])    a. first noted event monitor 08/2016.  . Osteoporosis   . PAF (paroxysmal atrial fibrillation) ([place])    a. in afib at time of echo 08/2016.  Marland Kitchen Scoliosis     mild  . Squamous cell carcinoma of neck     Patient Active Problem List   Diagnosis Date Noted  . PAF (paroxysmal atrial fibrillation) ([place])   . NSVT (nonsustained ventricular tachycardia) ([place])   . Mitral regurgitation   . Frequent PVCs   . Bradycardia   . Essential hypertension   . Hypokalemia   . Diverticulitis 08/09/2016  . Diverticulosis of colon 04/22/2016  . Diverticulosis of large intestine without perforation or abscess without bleeding 04/22/2016  . Generalized anxiety disorder 04/22/2016  . Irritable bowel syndrome without diarrhea 04/22/2016  . Mitral valve prolapse 04/22/2016  . Mixed hyperlipidemia 04/22/2016  . Osteopenia 04/22/2016  . Low blood pressure 09/24/2014  . Hypercholesterolemia   . History of depression   . Scoliosis   . Osteoporosis     Past Surgical History:  Procedure Laterality Date  . BASAL CELL CARCINOMA EXCISION Right    RLE  . BLEPHAROPLASTY    . BREAST BIOPSY Left   . BREAST CYST ASPIRATION Left   . BREAST CYST EXCISION Left   . BUNIONECTOMY Bilateral   . DILATION AND CURETTAGE OF UTERUS    . EXCISIONAL HEMORRHOIDECTOMY    .  KNEE ARTHROSCOPY Right 04/12/2007  . KNEE ARTHROSCOPY Left   . SQUAMOUS CELL CARCINOMA EXCISION     "neck"  . TONSILLECTOMY       OB History   None      Home Medications    Prior to Admission medications   Medication Sig Start Date End Date Taking? Authorizing Provider  bisoprolol (ZEBETA) 5 MG tablet TAKE 1/2 TABLET BY MOUTH EVERY DAY OR AS DIRECTED 03/18/17  Yes [provider]  LORazepam (ATIVAN) 1 MG tablet Take 1 tablet (1 mg total) at bedtime by mouth. 1  qhs  Half  qam 08/17/17  Yes Plovsky, Berneta Sages, MD  polyethylene glycol (MIRALAX / GLYCOLAX) packet Take 17 g by mouth daily as needed for mild constipation.    Yes [provider]  Probiotic Product (PROBIOTIC PO) Take 1 tablet by mouth as needed.   Yes [provider]  azithromycin (ZITHROMAX) 500 MG tablet Take 500  mg by mouth daily. For 7 days 12/27/17   [provider]  saccharomyces boulardii (FLORASTOR) 250 MG capsule Take 1 capsule (250 mg total) by mouth 2 (two) times daily. 08/12/16   Barton Dubois, MD  sertraline (ZOLOFT) 50 MG tablet Take 1 tablet (50 mg total) daily by mouth. 08/17/17 08/17/18  Plovsky, Berneta Sages, MD  Wheat Dextrin (BENEFIBER DRINK MIX PO) Take by mouth.    [provider]    Family History Family History  Problem Relation Age of Onset  . Heart failure Mother   . Leukemia Mother   . Heart failure Father     Social History Social History   Tobacco Use  . Smoking status: Former Smoker    Packs/day: 1.00    Years: 20.00    Pack years: 20.00    Types: Cigarettes    Last attempt to quit: 1989    Years since quitting: 30.2  . Smokeless tobacco: Never Used  Substance Use Topics  . Alcohol use: No  . Drug use: No     Allergies   Omeprazole; Flagyl [metronidazole]; Aripiprazole; Hylan g-f 20; Other; Paroxetine hcl; Toprol xl [metoprolol succinate]; Vancomycin; and Penicillins   Review of Systems Review of Systems  Constitutional: Negative for chills, diaphoresis, fatigue and fever.  HENT: Negative for congestion, rhinorrhea and sneezing.   Eyes: Negative.   Respiratory: Positive for shortness of breath. Negative for cough and chest tightness.   Cardiovascular: Positive for chest pain. Negative for leg swelling.  Gastrointestinal: Negative for abdominal pain, blood in stool, diarrhea, nausea and vomiting.  Genitourinary: Negative for difficulty urinating, flank pain, frequency and hematuria.  Musculoskeletal: Positive for arthralgias. Negative for back pain.  Skin: Negative for rash.  Neurological: Positive for numbness. Negative for dizziness, speech difficulty, weakness and headaches.     Physical Exam Updated Vital Signs BP 138/71   Pulse (!) 48   Temp 97.8 F (36.6 C) (Oral)   Resp 18   Ht 5\' 5"  (1.651 m)   Wt 69.9 kg (154 lb)   SpO2  95%   BMI 25.63 kg/m   Physical Exam  Constitutional: She is oriented to person, place, and time. She appears well-developed and well-nourished.  HENT:  Head: Normocephalic and atraumatic.  Eyes: Pupils are equal, round, and reactive to light.  Neck: Normal range of motion. Neck supple.  No pain along the cervical thoracic or lumbosacral spine  Cardiovascular: Normal rate, regular rhythm and normal heart sounds.  No pain on palpation of the ribs  Pulmonary/Chest: Effort normal and breath  sounds normal. No respiratory distress. She has no wheezes. She has no rales. She exhibits no tenderness.  Abdominal: Soft. Bowel sounds are normal. There is no tenderness. There is no rebound and no guarding.  Musculoskeletal: Normal range of motion. She exhibits no edema.  Positive tenderness to the left hip with shortening and external rotation of the leg.  She has some associated pain to her left knee.  She has sensation to light touch although she states it feels diminished although equal to the right foot.  She describes both of her feet as feeling numb.  She is able to wiggle her toes and has good flexion extension of the feet.  Pedal pulses are intact.  She has no other pain on palpation or range of motion of the extremities.    Lymphadenopathy:    She has no cervical adenopathy.  Neurological: She is alert and oriented to person, place, and time.  Skin: Skin is warm and dry. No rash noted.  Psychiatric: She has a normal mood and affect.     ED Treatments / Results  Labs (all labs ordered are listed, but only abnormal results are displayed) Labs Reviewed  BASIC METABOLIC PANEL - Abnormal; Notable for the following components:      Result Value   Creatinine, Ser 1.01 (*)    GFR calc non Af Amer 50 (*)    GFR calc Af Amer 58 (*)    All other components within normal limits  PROTIME-INR  TROPONIN I  CBC WITH DIFFERENTIAL/PLATELET  CBC WITH DIFFERENTIAL/PLATELET  I-STAT TROPONIN, ED  TYPE  AND SCREEN  ABO/RH    EKG EKG Interpretation  Date/Time:  Saturday December 31 2017 15:12:51 EDT Ventricular Rate:  54 PR Interval:    QRS Duration: 119 QT Interval:  505 QTC Calculation: 479 R Axis:   -31 Text Interpretation:  Sinus rhythm Left ventricular hypertrophy No old tracing to compare Confirmed by Malvin Johns [phone]) on 12/31/2017 3:32:08 PM Also confirmed by Malvin Johns [phone]), editor Abelardo Diesel [phone])  on 12/31/2017 3:58:04 PM   Radiology Dg Chest 1 View  Result Date: 12/31/2017 CLINICAL DATA:  [place] on front porch onto LEFT hip, slipped due to her slippers, denies loss of consciousness, unable to move LEFT leg due to pain EXAM: CHEST  1 VIEW COMPARISON:  08/10/2017 FINDINGS: Enlargement of cardiac silhouette. Tortuous thoracic aorta. Mediastinal contours and pulmonary vascularity otherwise normal. Lungs clear. No infiltrate, pleural effusion or pneumothorax. Bones unremarkable IMPRESSION: Enlargement of cardiac silhouette. No acute abnormalities. Electronically Signed   By: Lavonia Dana M.D.   On: 12/31/2017 16:07   Dg Knee 1-2 Views Left  Result Date: 12/31/2017 CLINICAL DATA:  Left hip pain following a fall on her front porch. EXAM: LEFT KNEE - 1-2 VIEW COMPARISON:  12/23/2017. FINDINGS: Joint space narrowing and associated spur formation involving the lateral and patellofemoral compartments. No fracture or dislocation seen. Stable small effusion. IMPRESSION: 1. No fracture or dislocation. 2. Stable degenerative changes and small effusion. Electronically Signed   By: Claudie Revering M.D.   On: 12/31/2017 16:07   Dg Hip Unilat W Or Wo Pelvis 2-3 Views Left  Result Date: 12/31/2017 CLINICAL DATA:  Left hip pain following a fall on her front porch today. EXAM: DG HIP (WITH OR WITHOUT PELVIS) 2-3V LEFT COMPARISON:  Abdomen and pelvis CT dated 08/19/2017. FINDINGS: Comminuted left intertrochanteric fracture with mild to moderate distraction of the fragments and mild medial  displacement of the distal fragment. There is also  proximal displacement of the lesser trochanter fragment. IMPRESSION: Comminuted left intertrochanteric fracture. Electronically Signed   By: Claudie Revering M.D.   On: 12/31/2017 16:06    Procedures Procedures (including critical care time)  Medications Ordered in ED Medications  fentaNYL (SUBLIMAZE) injection 50 mcg (50 mcg Intravenous Given 12/31/17 1627)     Initial Impression / Assessment and Plan / ED Course  I have reviewed the triage vital signs and the nursing notes.  Pertinent labs & imaging results that were available during my care of the patient were reviewed by me and considered in my medical decision making (see chart for details).     Patient is a 84 year old female who presents after a fall.  She has a noted intertrochanteric hip fracture of the left hip.  No other traumatic injuries are identified.  She denies hitting her head.  She is not on anticoagulants.  She has no neck or back pain.  She did have some chest pain on arrival an EKG was performed.  This shows bradycardia but no ischemic changes.  She is on a beta-blocker.  Troponin is negative.  She has had no further episodes of chest pain.  She feels like it might have been somewhat related to anxiety.  I spoke with Dr. Stann Mainland with orthopedics who will see the patient.  He requests patient to remain n.p.o. as he might operate on the hip tonight.  I spoke with Dr. Ree Kida with the hospitalist service who will admit the patient.  Final Clinical Impressions(s) / ED Diagnoses   Final diagnoses:  Closed fracture of left hip, initial encounter [facility])  Chest pain, unspecified type  Bradycardia    ED Discharge Orders    None       Malvin Johns, MD 12/31/17 1717

## 2017-12-31 NOTE — Brief Op Note (Signed)
12/31/2017  7:24 PM  PATIENT:  Carol Hardin  84 y.o. female  PRE-OPERATIVE DIAGNOSIS:  left hip fx  POST-OPERATIVE DIAGNOSIS:  left hip fx  PROCEDURE:  Procedure(s): INTRAMEDULLARY (IM) NAIL INTERTROCHANTRIC (Left)  SURGEON:  Surgeon(s) and Role:    * Nicholes Stairs, MD - Primary  PHYSICIAN ASSISTANT:   ASSISTANTS: none   ANESTHESIA:   general  EBL:  75 mL   BLOOD ADMINISTERED:none  DRAINS: none   LOCAL MEDICATIONS USED:  NONE  SPECIMEN:  No Specimen  DISPOSITION OF SPECIMEN:  N/A  COUNTS:  YES  TOURNIQUET:  * No tourniquets in log *  DICTATION: .Note written in EPIC  PLAN OF CARE: Admit to inpatient   PATIENT DISPOSITION:  PACU - hemodynamically stable.   Delay start of Pharmacological VTE agent (>24hrs) due to surgical blood loss or risk of bleeding: not applicable

## 2017-12-31 NOTE — Discharge Instructions (Signed)
Orthopedic discharge instructions:  You are okay for full weightbearing as tolerated to the operative extremity. Apply ice to the left hip for 30 minutes at a time every hour during the day. Maintain your postoperative bandages until your follow-up appointment with Dr. Stann Mainland.  These are waterproof and you may shower but please do not submerge underwater. 4 mild to moderate pain use Tylenol and/or Motrin.  For breakthrough pain use Norco as directed. For the prevention of blood clots take an 81 mg aspirin twice daily for 6 weeks. Follow-up with Dr. Stann Mainland in 2 weeks for suture removal and wound check.

## 2017-12-31 NOTE — Op Note (Signed)
Date of Surgery: 12/31/2017  INDICATIONS: Ms. Miranda is a 84 y.o.-year-old female who sustained a left hip fracture.  She was in her normal state of health earlier today when she slipped wearing her bedroom slippers.  She fell onto her left hip.  She does not require any assistance for ADLs and is independent Lee ambulatory without aid.  The risks and benefits of the procedure discussed with the patient prior to the procedure and all questions were answered; consent was obtained.  PREOPERATIVE DIAGNOSIS: left hip fracture   POSTOPERATIVE DIAGNOSIS: Same   PROCEDURE: Treatment of intertrochanteric, pertrochanteric, subtrochanteric fracture with intramedullary implant. CPT [id number]   SURGEON: Dannielle Karvonen. Stann Mainland, M.D.   ANESTHESIA: general   IV FLUIDS AND URINE: See anesthesia record   ESTIMATED BLOOD LOSS: 75 mL  IMPLANTS:   Synthes TFN A 10 mm diameter by intermediate length 95 mm compression screw 36 mm x 5.0 mm distal interlock  DRAINS: None.   COMPLICATIONS: None.   DESCRIPTION OF PROCEDURE: The patient was brought to the operating room and placed supine on the operating table. The patient's leg had been signed prior to the procedure. The patient had the anesthesia placed by the anesthesiologist. The prep verification and incision time-outs were performed to confirm that this was the correct patient, site, side and location. The patient had an SCD on the opposite lower extremity. The patient did receive antibiotics prior to the incision and was re-dosed during the procedure as needed at indicated intervals. The patient was positioned on the fracture table with the table in traction and internal rotation to reduce the hip. The well leg was placed in a scissor position and all bony prominences were well-padded. The patient had the lower extremity prepped and draped in the standard surgical fashion. The incision was made 4 finger breadths superior to the greater trochanter. A guide pin was  inserted into the tip of the greater trochanter under fluoroscopic guidance. An opening reamer was used to gain access to the femoral canal. The nail length was measured and inserted down the femoral canal to its proper depth. The appropriate version of insertion for the lag screw was found under fluoroscopy. A pin was inserted up the femoral neck through the jig. The length of the lag screw was then measured. The lag screw was inserted as near to center-center in the head as possible. The leg was taken out of traction, then the compression screw was used to compress across the fracture. Compression was visualized on serial xrays.   We next turned our attention to the distal interlocking screw.  This was placed through the drill guide of the nail inserter.  A small incision was made overlying the lateral thigh at the screw site, and a tonsil was used to disect down to bone.  A drill pass was made through the jig and across the nail through both cortices.  This was measured, and the appropriate screw was placed under hand power and found to have good bite.    The wound was copiously irrigated with saline and the subcutaneous layer closed with 2.0 Monocryl and the skin was reapproximated with staples. The wounds were cleaned and dried a final time and a sterile dressing was placed. The hip was taken through a range of motion at the end of the case under fluoroscopic imaging to visualize the approach-withdraw phenomenon and confirm implant length in the head. The patient was then awakened from anesthesia and taken to the recovery room in stable condition.  All counts were correct at the end of the case.   POSTOPERATIVE PLAN: The patient will be weight bearing as tolerated and will return in 2 weeks for staple removal and the patient will receive DVT prophylaxis based on other medications, activity level, and risk ratio of bleeding to thrombosis.  Our recommendation will be for twice daily 81 mg aspirin for 6  weeks.  She will be admitted to the medicine service postoperatively.   Geralynn Rile, [place] [phone] 7:27 PM

## 2017-12-31 NOTE — ED Notes (Signed)
Patient transported to X-ray 

## 2017-12-31 NOTE — Anesthesia Postprocedure Evaluation (Signed)
Anesthesia Post Note  Patient: Carol Hardin  Procedure(s) Performed: INTRAMEDULLARY (IM) NAIL INTERTROCHANTRIC (Left Hip)     Patient location during evaluation: PACU Anesthesia Type: General Level of consciousness: awake and alert Pain management: pain level controlled Vital Signs Assessment: post-procedure vital signs reviewed and stable Respiratory status: spontaneous breathing, nonlabored ventilation, respiratory function stable and patient connected to nasal cannula oxygen Cardiovascular status: blood pressure returned to baseline and stable Postop Assessment: no apparent nausea or vomiting Anesthetic complications: no    Last Vitals:  Vitals:   12/31/17 2000 12/31/17 2015  BP: 121/67 131/69  Pulse: (!) 58 (!) 50  Resp: 12 13  Temp:  (!) 36.2 C  SpO2: 99% 100%    Last Pain:  Vitals:   12/31/17 2015  TempSrc:   PainSc: 4                  Olyn Landstrom DAVID

## 2017-12-31 NOTE — ED Triage Notes (Signed)
Pt arrived via [facility] after pt fell on her front porch onto her left hip. Pt stated she slipped due to her slippers. Pt denies LOC or striking her head. Pt not on thinners and denies back or neck pain at time of triage. EMS reported shortening and rotation of left leg. Distal Pulses presentm per ems. EMS V/s 148/90, hr 80, Sp02 97%ra, 20rr.

## 2017-12-31 NOTE — H&P (Signed)
[facility] History and Physical  Carol Hardin XIP:[id number] DOB: 1933/02/15 DOA: 12/31/2017  PCP: Leeroy Cha, MD  Patient coming from: Home  Chief Complaint: Fall and left leg pain  HPI: Carol Hardin is a 84 y.o. female with a medical history of anxiety, depression, bradycardia, paroxysmal atrial fibrillation, who presented to the emergency department after falling.  Patient states that she was on her porch during a yard sale at which point she slipped and fell on her left hip.  She denies any dizziness prior to the episode.  Denies any loss of consciousness.  Patient does endorse having fallen approximately 1 week ago and scraping her left knee.  Her PCP placed her on azithromycin.  Currently patient does complain of some left hip pain.  Denies any recent illness or sick contacts, chest pain, shortness of breath, abdominal pain, nausea or vomiting, diarrhea or constipation, changes in bowel habits, urinary frequency or pain, dizziness or headache.  ED Course: Patient found to have left intertrochanteric fracture on x-ray.  Orthopedics called for consult.  TR[facility] called for admission.  Review of Systems:  All other systems reviewed and are negative.   Past Medical History:  Diagnosis Date  . Anxiety   . Arthritis    "fingers, right toe" (08/09/2016)  . Basal cell carcinoma of lower leg, right   . Bradycardia    a. Holter 08/30/16 showed profound bradycardia down to 30 during awake hours, several 2 second pauses, very frequent PVCs >8000 in 48 hours, and NSVT (longest of 14 beats).  . Cardiomyopathy ([place])    a. EF 40% by echo 08/2016.  . Daily headache    "I usually wake up w/a headache; sometimes it's a migraine" (08/09/2016)  . Depression   . Diverticulosis    Hx. of  . Frequent PVCs   . GERD (gastroesophageal reflux disease)   . Hypercholesterolemia   . Migraine   . Mitral regurgitation   . MVP (mitral valve prolapse)   . NSVT (nonsustained  ventricular tachycardia) ([place])    a. first noted event monitor 08/2016.  . Osteoporosis   . PAF (paroxysmal atrial fibrillation) ([place])    a. in afib at time of echo 08/2016.  Marland Kitchen Scoliosis    mild  . Squamous cell carcinoma of neck     Past Surgical History:  Procedure Laterality Date  . BASAL CELL CARCINOMA EXCISION Right    RLE  . BLEPHAROPLASTY    . BREAST BIOPSY Left   . BREAST CYST ASPIRATION Left   . BREAST CYST EXCISION Left   . BUNIONECTOMY Bilateral   . DILATION AND CURETTAGE OF UTERUS    . EXCISIONAL HEMORRHOIDECTOMY    . KNEE ARTHROSCOPY Right 04/12/2007  . KNEE ARTHROSCOPY Left   . SQUAMOUS CELL CARCINOMA EXCISION     "neck"  . TONSILLECTOMY      Social History:  reports that she quit smoking about 30 years ago. Her smoking use included cigarettes. She has a 20.00 pack-year smoking history. She has never used smokeless tobacco. She reports that she does not drink alcohol or use drugs.  Allergies  Allergen Reactions  . Omeprazole Other (See Comments)    siezure  . Flagyl [Metronidazole] Nausea And Vomiting  . Aripiprazole Other (See Comments)  . Hylan G-F 20 Other (See Comments)  . Other Other (See Comments)  . Paroxetine Hcl Other (See Comments)    Headaches (a long time ago- pt doesn't really remember)  . Toprol Xl [Metoprolol Succinate] Other (  See Comments)    Dizziness--pt doesn't really remember   . Vancomycin Other (See Comments)    Pt reports that they gave it too fast- she started having itching in the scalp  . Penicillins Rash    Family History  Problem Relation Age of Onset  . Heart failure Mother   . Leukemia Mother   . Heart failure Father    Prior to Admission medications   Medication Sig Start Date End Date Taking? Authorizing Provider  azithromycin (ZITHROMAX) 500 MG tablet Take 500 mg by mouth daily. For 7 days 12/27/17  Yes [provider]  bisoprolol (ZEBETA) 5 MG tablet TAKE 1/2 TABLET BY MOUTH EVERY DAY OR AS DIRECTED 03/18/17   Yes [provider]  Cholecalciferol (VITAMIN D) 2000 units CAPS Take 2,000 Units by mouth daily.   Yes [provider]  LORazepam (ATIVAN) 1 MG tablet Take 1 tablet (1 mg total) at bedtime by mouth. 1  qhs  Half  qam 08/17/17  Yes Plovsky, Berneta Sages, MD  polyethylene glycol (MIRALAX / GLYCOLAX) packet Take 17 g by mouth daily as needed for mild constipation.    Yes [provider]  Probiotic Product (PROBIOTIC PO) Take 1 tablet by mouth as needed.   Yes [provider]    Physical Exam: Vitals:   12/31/17 1700 12/31/17 1715  BP: 138/71 121/73  Pulse: (!) 48 (!) 50  Resp: 18 13  Temp:    SpO2: 95% 100%     General: Well developed, well nourished, NAD, appears stated age  HEENT: NCAT, PERRLA, EOMI, Anicteic Sclera, mucous membranes moist.   Neck: Supple, no JVD, no masses  Cardiovascular: S1 S2 auscultated, 2/6 SEM, Bradycardia   Respiratory: Clear to auscultation bilaterally with equal chest rise  Abdomen: Soft, nontender, nondistended, + bowel sounds  Extremities: warm dry without cyanosis clubbing or edema. LLE externally rotated  Neuro: AAOx3, cranial nerves grossly intact. 5/5 strength in upper ext B/L. Strength not tested in lower ext  Skin: Without rashes exudates or nodules. Abrasion noted on left knee- no erythema  Psych: Normal affect and demeanor with intact judgement and insight  Labs on Admission: I have personally reviewed following labs and imaging studies CBC: Recent Labs  Lab 12/31/17 1620  WBC 8.9  NEUTROABS 6.9  HGB 12.3  HCT 37.6  MCV 95.7  PLT 195   Basic Metabolic Panel: Recent Labs  Lab 12/31/17 1512  NA 138  K 4.0  CL 105  CO2 24  GLUCOSE 93  BUN 11  CREATININE 1.01*  CALCIUM 9.3   GFR: Estimated Creatinine Clearance: 42.2 mL/min (A) (by C-G formula based on SCr of 1.01 mg/dL (H)). Liver Function Tests: No results for input(s): AST, ALT, ALKPHOS, BILITOT, PROT, ALBUMIN in the last 168 hours. No  results for input(s): LIPASE, AMYLASE in the last 168 hours. No results for input(s): AMMONIA in the last 168 hours. Coagulation Profile: Recent Labs  Lab 12/31/17 1512  INR 0.98   Cardiac Enzymes: Recent Labs  Lab 12/31/17 1539  TROPONINI <0.03   BNP (last 3 results) No results for input(s): PROBNP in the last 8760 hours. HbA1C: No results for input(s): HGBA1C in the last 72 hours. CBG: No results for input(s): GLUCAP in the last 168 hours. Lipid Profile: No results for input(s): CHOL, HDL, LDLCALC, TRIG, CHOLHDL, LDLDIRECT in the last 72 hours. Thyroid Function Tests: No results for input(s): TSH, T4TOTAL, FREET4, T3FREE, THYROIDAB in the last 72 hours. Anemia Panel: No results for input(s):  VITAMINB12, FOLATE, FERRITIN, TIBC, IRON, RETICCTPCT in the last 72 hours. Urine analysis:    Component Value Date/Time   COLORURINE YELLOW 08/09/2016 1721   APPEARANCEUR CLEAR 08/09/2016 1721   LABSPEC >1.046 (H) 08/09/2016 1721   PHURINE 8.0 08/09/2016 1721   GLUCOSEU NEGATIVE 08/09/2016 1721   HGBUR NEGATIVE 08/09/2016 1721   BILIRUBINUR NEGATIVE 08/09/2016 1721   KETONESUR NEGATIVE 08/09/2016 1721   PROTEINUR NEGATIVE 08/09/2016 1721   UROBILINOGEN 0.2 08/16/2008 2359   NITRITE NEGATIVE 08/09/2016 1721   LEUKOCYTESUR NEGATIVE 08/09/2016 1721   Sepsis Labs: @LABRCNTIP (procalcitonin:4,lacticidven:4) )No results found for this or any previous visit (from the past 240 hour(s)).   Radiological Exams on Admission: Dg Chest 1 View  Result Date: 12/31/2017 CLINICAL DATA:  [place] on front porch onto LEFT hip, slipped due to her slippers, denies loss of consciousness, unable to move LEFT leg due to pain EXAM: CHEST  1 VIEW COMPARISON:  08/10/2017 FINDINGS: Enlargement of cardiac silhouette. Tortuous thoracic aorta. Mediastinal contours and pulmonary vascularity otherwise normal. Lungs clear. No infiltrate, pleural effusion or pneumothorax. Bones unremarkable IMPRESSION: Enlargement of  cardiac silhouette. No acute abnormalities. Electronically Signed   By: Lavonia Dana M.D.   On: 12/31/2017 16:07   Dg Knee 1-2 Views Left  Result Date: 12/31/2017 CLINICAL DATA:  Left hip pain following a fall on her front porch. EXAM: LEFT KNEE - 1-2 VIEW COMPARISON:  12/23/2017. FINDINGS: Joint space narrowing and associated spur formation involving the lateral and patellofemoral compartments. No fracture or dislocation seen. Stable small effusion. IMPRESSION: 1. No fracture or dislocation. 2. Stable degenerative changes and small effusion. Electronically Signed   By: Claudie Revering M.D.   On: 12/31/2017 16:07   Dg Hip Unilat W Or Wo Pelvis 2-3 Views Left  Result Date: 12/31/2017 CLINICAL DATA:  Left hip pain following a fall on her front porch today. EXAM: DG HIP (WITH OR WITHOUT PELVIS) 2-3V LEFT COMPARISON:  Abdomen and pelvis CT dated 08/19/2017. FINDINGS: Comminuted left intertrochanteric fracture with mild to moderate distraction of the fragments and mild medial displacement of the distal fragment. There is also proximal displacement of the lesser trochanter fragment. IMPRESSION: Comminuted left intertrochanteric fracture. Electronically Signed   By: Claudie Revering M.D.   On: 12/31/2017 16:06    EKG: Independently reviewed.  Sinus bradycardia, rate 54.  No prior EKG for comparison  Assessment/Plan  Left intertrochanteric fracture -Status post mechanical fall -Left hip x-ray showed comminuted left intertrochanteric fracture -Orthopedic surgery consulted and appreciated, plan for OR later this evening -Given patient's age and comorbidities, she is at moderate risk -Continue pain control -Chest x-ray reviewed and unremarkable for infection -EKG noted below -PT OT will be consulted likely on 01/01/2018 -Of note patient did fall 1 week ago and scraped her knee, was given azithromycin by her primary care physician-antibiotic held, patient will need reassessment of wound on 3/24, no cellulitis or  erythema noted.  She will be getting 1 dose of clindamycin prior to surgery, ordered by orthopedics.  History of paroxysmal atrial fibrillation/history of bradycardia -Noted to have profound bradycardia on Holter monitor back in 2017 as well as paroxysmal atrial fibrillation -Follows with cardiology, Dr. Wynonia Lawman -has been on bisoprolol which has kept her rate controlled -will hold bisoprolol for now given that patient has bradycardia, HR 40s-50s -EKG showed sinus bradycardia with heart rate of 54, no ischemic changes -Will place patient on telemetry  Anxiety/depression -Continue Ativan, Zoloft  Osteoporosis/osteopenia -Continue vitamin D supplementation  DVT prophylaxis: SCDs  Code  Status: Full  Family Communication: Family at bedside. Admission, patients condition and plan of care including tests being ordered have been discussed with the patient and family who indicate understanding and agree with the plan and Code Status.  Disposition Plan: Admitted. Dispo TBD  Consults called: Orthopedics, Dr. Stann Mainland   Admission status: Inpatient.   Time spent: 70 minutes  Codee Bloodworth D.O. [facility] Pager [phone]  If 7PM-7AM, please contact night-coverage [email or web address] Password [facility] 12/31/2017, 5:44 PM

## 2017-12-31 NOTE — Progress Notes (Signed)
Orthopedic Tech Progress Note Patient Details:  Carol Hardin 07-Mar-1933 [id number]  Patient ID: [name], female   DOB: Nov 29, 1932, 84 y.o.   MRN: [id number] Pt cant have ohf due to age restrictions  [name] 12/31/2017, 6:58 PM

## 2018-01-01 ENCOUNTER — Encounter (HOSPITAL_COMMUNITY): Payer: Self-pay | Admitting: *Deleted

## 2018-01-01 DIAGNOSIS — R001 Bradycardia, unspecified: Secondary | ICD-10-CM

## 2018-01-01 DIAGNOSIS — S72002A Fracture of unspecified part of neck of left femur, initial encounter for closed fracture: Secondary | ICD-10-CM

## 2018-01-01 LAB — CBC
HCT: 30.8 % — ABNORMAL LOW (ref 36.0–46.0)
HEMOGLOBIN: 10 g/dL — AB (ref 12.0–15.0)
MCH: 31 pg (ref 26.0–34.0)
MCHC: 32.5 g/dL (ref 30.0–36.0)
MCV: 95.4 fL (ref 78.0–100.0)
Platelets: 165 10*3/uL (ref 150–400)
RBC: 3.23 MIL/uL — ABNORMAL LOW (ref 3.87–5.11)
RDW: 13.9 % (ref 11.5–15.5)
WBC: 6.7 10*3/uL (ref 4.0–10.5)

## 2018-01-01 LAB — BASIC METABOLIC PANEL
ANION GAP: 7 (ref 5–15)
BUN: 10 mg/dL (ref 6–20)
CALCIUM: 8.3 mg/dL — AB (ref 8.9–10.3)
CO2: 24 mmol/L (ref 22–32)
Chloride: 105 mmol/L (ref 101–111)
Creatinine, Ser: 0.97 mg/dL (ref 0.44–1.00)
GFR calc non Af Amer: 53 mL/min — ABNORMAL LOW (ref >60)
Glucose, Bld: 110 mg/dL — ABNORMAL HIGH (ref 65–99)
Potassium: 3.6 mmol/L (ref 3.5–5.1)
SODIUM: 136 mmol/L (ref 135–145)

## 2018-01-01 MED ORDER — ASPIRIN EC 81 MG PO TBEC
81.0000 mg | DELAYED_RELEASE_TABLET | Freq: Two times a day (BID) | ORAL | Status: DC
  Administered 2018-01-01 – 2018-01-02 (×2): 81 mg via ORAL
  Filled 2018-01-01 (×2): qty 1

## 2018-01-01 MED ORDER — BACITRACIN-NEOMYCIN-POLYMYXIN 400-5-5000 EX OINT
TOPICAL_OINTMENT | Freq: Three times a day (TID) | CUTANEOUS | Status: DC
  Administered 2018-01-01: 17:00:00 via TOPICAL
  Administered 2018-01-01 – 2018-01-02 (×2): 1 via TOPICAL
  Filled 2018-01-01 (×3): qty 1

## 2018-01-01 NOTE — Evaluation (Signed)
Physical Therapy Evaluation Patient Details Name: Carol Hardin MRN: [id number] DOB: Jul 04, 1933 Today's Date: 01/01/2018   History of Present Illness  Pt is an 84 y.o. female with a medical history of anxiety, depression, bradycardia, and paroxysmal atrial fibrillation. She presented to the ED s/p fall. Xray revealed L hip fx. She underwent IM nailing 12-31-17.     Clinical Impression  Pt admitted with above diagnosis. Pt currently with functional limitations due to the deficits listed below (see PT Problem List). PTA pt independent with functional mobility, living at home with her husband. On eval, pt required mod assist bed mobility, mod assist sit to stand and min assist ambulation 5 feet with RW.  Pt will benefit from skilled PT to increase their independence and safety with mobility to allow discharge to the venue listed below.       Follow Up Recommendations SNF;Supervision/Assistance - 24 hour    Equipment Recommendations  Rolling walker with 5" wheels    Recommendations for Other Services       Precautions / Restrictions Precautions Precautions: Fall Restrictions LLE Weight Bearing: Weight bearing as tolerated      Mobility  Bed Mobility Overal bed mobility: Needs Assistance Bed Mobility: Supine to Sit     Supine to sit: Mod assist     General bed mobility comments: +rail, verbal cues for sequencing, increased time  Transfers Overall transfer level: Needs assistance Equipment used: Rolling walker (2 wheeled) Transfers: Sit to/from Stand Sit to Stand: Mod assist         General transfer comment: verbal cues for hand placement, assist to power up, increased time to attain full upright stance  Ambulation/Gait Ambulation/Gait assistance: Min assist Ambulation Distance (Feet): 5 Feet Assistive device: Rolling walker (2 wheeled) Gait Pattern/deviations: Step-to pattern;Decreased stride length;Antalgic Gait velocity: decreased Gait velocity interpretation:  Below normal speed for age/gender General Gait Details: verbal cues for sequencing, assist with RW management  Stairs            Wheelchair Mobility    Modified Rankin (Stroke Patients Only)       Balance Overall balance assessment: Needs assistance Sitting-balance support: Feet supported;No upper extremity supported Sitting balance-Leahy Scale: Good     Standing balance support: Bilateral upper extremity supported;During functional activity Standing balance-Leahy Scale: Poor Standing balance comment: reliant on RW and therapist                             Pertinent Vitals/Pain Pain Assessment: Faces Faces Pain Scale: Hurts even more Pain Location: LLE with mobility Pain Descriptors / Indicators: Guarding;Grimacing Pain Intervention(s): Monitored during session;Limited activity within patient's tolerance;Repositioned;Ice applied    Home Living Family/patient expects to be discharged to:: Private residence Living Arrangements: Spouse/significant other Available Help at Discharge: Family;Available 24 hours/day Type of Home: House Home Access: Stairs to enter   [employer] of Steps: 2 Home Layout: One level Home Equipment: None Additional Comments: Pt currently living in a multi level house but is scheduled to move into the above noted home environment this week.     Prior Function Level of Independence: Independent         Comments: no AD     Hand Dominance        Extremity/Trunk Assessment   Upper Extremity Assessment Upper Extremity Assessment: Defer to OT evaluation    Lower Extremity Assessment Lower Extremity Assessment: LLE deficits/detail LLE Deficits / Details: expected deficits following hip sx  Cervical / Trunk Assessment Cervical / Trunk Assessment: Kyphotic  Communication   Communication: No difficulties  Cognition Arousal/Alertness: Awake/alert Behavior During Therapy: WFL for tasks  assessed/performed Overall Cognitive Status: Within Functional Limits for tasks assessed                                        General Comments      Exercises     Assessment/Plan    PT Assessment Patient needs continued PT services  PT Problem List Decreased strength;Decreased mobility;Decreased activity tolerance;Pain;Decreased balance;Decreased knowledge of use of DME       PT Treatment Interventions DME instruction;Therapeutic activities;Gait training;Therapeutic exercise;Patient/family education;Balance training;Functional mobility training    PT Goals (Current goals can be found in the Care Plan section)  Acute Rehab PT Goals Patient Stated Goal: get better PT Goal Formulation: With patient Time For Goal Achievement: 01/15/18 Potential to Achieve Goals: Good    Frequency Min 3X/week   Barriers to discharge        Co-evaluation               AM-PAC PT "6 Clicks" Daily Activity  Outcome Measure Difficulty turning over in bed (including adjusting bedclothes, sheets and blankets)?: A Lot Difficulty moving from lying on back to sitting on the side of the bed? : Unable Difficulty sitting down on and standing up from a chair with arms (e.g., wheelchair, bedside commode, etc,.)?: Unable Help needed moving to and from a bed to chair (including a wheelchair)?: A Little Help needed walking in hospital room?: A Little Help needed climbing 3-5 steps with a railing? : A Lot 6 Click Score: 12    End of Session Equipment Utilized During Treatment: Gait belt;Oxygen Activity Tolerance: Patient tolerated treatment well Patient left: in chair;with call bell/phone within reach Nurse Communication: Mobility status PT Visit Diagnosis: Other abnormalities of gait and mobility (R26.89);Difficulty in walking, not elsewhere classified (R26.2);Pain;Muscle weakness (generalized) (M62.81) Pain - Right/Left: Left Pain - part of body: Hip    Time: 2355-7322 PT Time  Calculation (min) (ACUTE ONLY): 21 min   Charges:   PT Evaluation $PT Eval Moderate Complexity: 1 Mod     PT G Codes:        Carol Hardin, PT  Office # [phone] Pager [phone]   Carol Hardin 01/01/2018, 9:16 AM

## 2018-01-01 NOTE — Progress Notes (Signed)
Carol Hardin  MRN: [id number] DOB/Age: [birth year] 84 y.o. [facility] Procedure: Procedure(s) (LRB): INTRAMEDULLARY (IM) NAIL INTERTROCHANTRIC (Left)     Subjective: Up in chair, seen with Dr. Onnie Graham  Vital Signs Temp:  [97.2 F (36.2 C)-97.9 F (36.6 C)] 97.6 F (36.4 C) (03/24 0522) Pulse Rate:  [43-58] 45 (03/24 0522) Resp:  [9-19] 16 (03/24 0522) BP: (90-138)/(53-87) 90/54 (03/24 0522) SpO2:  [95 %-100 %] 100 % (03/24 0522) Weight:  [69.9 kg (154 lb)] 69.9 kg (154 lb) (03/23 2200)  Lab Results Recent Labs    12/31/17 1620 01/01/18 0652  WBC 8.9 6.7  HGB 12.3 10.0*  HCT 37.6 30.8*  PLT 208 165   BMET Recent Labs    12/31/17 1512 01/01/18 0652  NA 138 136  K 4.0 3.6  CL 105 105  CO2 24 24  GLUCOSE 93 110*  BUN 11 10  CREATININE 1.01* 0.97  CALCIUM 9.3 8.3*   INR  Date Value Ref Range Status  12/31/2017 0.98  Final    Comment:    Performed at [facility], [place] [address] [place], [place] [address]     Exam NVI to operative extremity Dressing dry        Plan Continue OOB with PT   Jenetta Loges PA-C  01/01/2018, 9:49 AM Contact # [phone]

## 2018-01-01 NOTE — Progress Notes (Addendum)
PROGRESS NOTE    Carol Hardin  GQQ:[id number] DOB: Apr 14, 1933 DOA: 12/31/2017 PCP: Leeroy Cha, MD   Brief Narrative: Patient is a 84 year old female with past medical history of anxiety, depression, bradycardia, paroxysmal A. fib who presented to the emergency department after falling.  No report of loss of consciousness or head injury.  Imagings done in the emergency department was suggestive of left intertrochanteric fracture.  Orthopedics following and she underwent  Assessment & Plan:   Active Problems:   Generalized anxiety disorder   PAF (paroxysmal atrial fibrillation) (HCC)   Bradycardia   Hip fracture (HCC)  Left intertrochanteric fracture: Left hip Xray showed comminuted left intertrochanteric fracture.  Status post intramedullary implant Orthopedics is following. PT/OT consulted.  Patient likely needs a skilled nursing facility placement. Orthopedics recommends weightbearing as tolerated.  She will follow-up at orthopedics in 2 weeks for staple removal.  Recommended aspirin 81 mg twice a day for 6 weeks for DVT prophylaxis.  History of paroxysmal A. fib: Follows with cardiology as an outpatient.  Was on bisoprolol for rate control.  Bisoprolol held for bradycardia.  Patient asymptomatic.  Continue to monitor on telemetry  Bradycardia: Still bradycardic.  Slightly hypertensive also.  We will continue to monitor.  Patient denies any lightheadedness or dizziness.  Anxiety/depression: On Ativan, Zoloft.  Osteoporosis/osteopenia: Continue vitamin D supplementation  DVT prophylaxis: Aspirin Code Status: Full Family Communication: Family present at the bedside Disposition Plan: Skilled nursing facility after evaluation by PT/OT   Consultants: Orthopedics  Procedures:  Antimicrobials: None  Subjective: Patient seen and examined the bedside this afternoon.  Her pain is better controlled.  No new issues/events.  Waiting for physical therapy  evaluation.   Objective: Vitals:   12/31/17 2200 01/01/18 0148 01/01/18 0200 01/01/18 0522  BP:  (!) 93/53  (!) 90/54  Pulse:  (!) 48 (!) 55 (!) 45  Resp:  16  16  Temp:  97.9 F (36.6 C)  97.6 F (36.4 C)  TempSrc:  Oral  Oral  SpO2:  100%  100%  Weight: 69.9 kg (154 lb)     Height: 5\' 5"  (1.651 m)       Intake/Output Summary (Last 24 hours) at 01/01/2018 1353 Last data filed at 01/01/2018 0530 Gross per 24 hour  Intake 740 ml  Output 425 ml  Net 315 ml   Filed Weights   12/31/17 1448 12/31/17 2200  Weight: 69.9 kg (154 lb) 69.9 kg (154 lb)    Examination:  General exam: Appears calm and comfortable ,Not in distress,average built HEENT:PERRL,Oral mucosa moist, Ear/Nose normal on gross exam Respiratory system: Bilateral equal air entry, normal vesicular breath sounds, no wheezes or crackles  Cardiovascular system: S1 & S2 heard, RRR. No JVD, murmurs, rubs, gallops or clicks. No pedal edema. Gastrointestinal system: Abdomen is nondistended, soft and nontender. No organomegaly or masses felt. Normal bowel sounds heard. Central nervous system: Alert and oriented. No focal neurological deficits. Extremities: No edema, no clubbing ,no cyanosis, distal peripheral pulses palpable.Clean surgical wound on the left hip.  Tenderness on palpation Skin: No rashes, lesions or ulcers,no icterus ,no pallor MSK: Normal muscle bulk,tone ,power Psychiatry: Judgement and insight appear normal. Mood & affect appropriate.     Data Reviewed: I have personally reviewed following labs and imaging studies  CBC: Recent Labs  Lab 12/31/17 1620 01/01/18 0652  WBC 8.9 6.7  NEUTROABS 6.9  --   HGB 12.3 10.0*  HCT 37.6 30.8*  MCV 95.7 95.4  PLT 208 165  Basic Metabolic Panel: Recent Labs  Lab 12/31/17 1512 01/01/18 0652  NA 138 136  K 4.0 3.6  CL 105 105  CO2 24 24  GLUCOSE 93 110*  BUN 11 10  CREATININE 1.01* 0.97  CALCIUM 9.3 8.3*   GFR: Estimated Creatinine Clearance: 43.9  mL/min (by C-G formula based on SCr of 0.97 mg/dL). Liver Function Tests: No results for input(s): AST, ALT, ALKPHOS, BILITOT, PROT, ALBUMIN in the last 168 hours. No results for input(s): LIPASE, AMYLASE in the last 168 hours. No results for input(s): AMMONIA in the last 168 hours. Coagulation Profile: Recent Labs  Lab 12/31/17 1512  INR 0.98   Cardiac Enzymes: Recent Labs  Lab 12/31/17 1539  TROPONINI <0.03   BNP (last 3 results) No results for input(s): PROBNP in the last 8760 hours. HbA1C: No results for input(s): HGBA1C in the last 72 hours. CBG: No results for input(s): GLUCAP in the last 168 hours. Lipid Profile: No results for input(s): CHOL, HDL, LDLCALC, TRIG, CHOLHDL, LDLDIRECT in the last 72 hours. Thyroid Function Tests: No results for input(s): TSH, T4TOTAL, FREET4, T3FREE, THYROIDAB in the last 72 hours. Anemia Panel: No results for input(s): VITAMINB12, FOLATE, FERRITIN, TIBC, IRON, RETICCTPCT in the last 72 hours. Sepsis Labs: No results for input(s): PROCALCITON, LATICACIDVEN in the last 168 hours.  No results found for this or any previous visit (from the past 240 hour(s)).       Radiology Studies: Dg Chest 1 View  Result Date: 12/31/2017 CLINICAL DATA:  [place] on front porch onto LEFT hip, slipped due to her slippers, denies loss of consciousness, unable to move LEFT leg due to pain EXAM: CHEST  1 VIEW COMPARISON:  08/10/2017 FINDINGS: Enlargement of cardiac silhouette. Tortuous thoracic aorta. Mediastinal contours and pulmonary vascularity otherwise normal. Lungs clear. No infiltrate, pleural effusion or pneumothorax. Bones unremarkable IMPRESSION: Enlargement of cardiac silhouette. No acute abnormalities. Electronically Signed   By: Lavonia Dana M.D.   On: 12/31/2017 16:07   Dg Knee 1-2 Views Left  Result Date: 12/31/2017 CLINICAL DATA:  Left hip pain following a fall on her front porch. EXAM: LEFT KNEE - 1-2 VIEW COMPARISON:  12/23/2017. FINDINGS:  Joint space narrowing and associated spur formation involving the lateral and patellofemoral compartments. No fracture or dislocation seen. Stable small effusion. IMPRESSION: 1. No fracture or dislocation. 2. Stable degenerative changes and small effusion. Electronically Signed   By: Claudie Revering M.D.   On: 12/31/2017 16:07   Dg C-arm 1-60 Min  Result Date: 12/31/2017 CLINICAL DATA:  Left proximal femoral fracture EXAM: DG C-ARM 61-120 MIN; DG HIP (WITH OR WITHOUT PELVIS) 2-3V LEFT COMPARISON:  12/31/2017 FLUOROSCOPY TIME:  Fluoroscopy Time:  50 seconds Radiation Exposure Index (if provided by the fluoroscopic device): Not available Number of Acquired Spot Images: 6 FINDINGS: Initial images demonstrate the known proximal left femoral fracture. Proximal femoral rod with fixation screw is seen on subsequent images. The fracture fragments are in near anatomic alignment. IMPRESSION: ORIF of proximal left femoral fracture. Electronically Signed   By: Inez Catalina M.D.   On: 12/31/2017 19:55   Dg Hip Unilat With Pelvis 2-3 Views Left  Result Date: 12/31/2017 CLINICAL DATA:  Left proximal femoral fracture EXAM: DG C-ARM 61-120 MIN; DG HIP (WITH OR WITHOUT PELVIS) 2-3V LEFT COMPARISON:  12/31/2017 FLUOROSCOPY TIME:  Fluoroscopy Time:  50 seconds Radiation Exposure Index (if provided by the fluoroscopic device): Not available Number of Acquired Spot Images: 6 FINDINGS: Initial images demonstrate the known proximal left  femoral fracture. Proximal femoral rod with fixation screw is seen on subsequent images. The fracture fragments are in near anatomic alignment. IMPRESSION: ORIF of proximal left femoral fracture. Electronically Signed   By: Inez Catalina M.D.   On: 12/31/2017 19:55   Dg Hip Unilat W Or Wo Pelvis 2-3 Views Left  Result Date: 12/31/2017 CLINICAL DATA:  Left hip pain following a fall on her front porch today. EXAM: DG HIP (WITH OR WITHOUT PELVIS) 2-3V LEFT COMPARISON:  Abdomen and pelvis CT dated  08/19/2017. FINDINGS: Comminuted left intertrochanteric fracture with mild to moderate distraction of the fragments and mild medial displacement of the distal fragment. There is also proximal displacement of the lesser trochanter fragment. IMPRESSION: Comminuted left intertrochanteric fracture. Electronically Signed   By: Claudie Revering M.D.   On: 12/31/2017 16:06        Scheduled Meds: . acetaminophen  500 mg Oral Q6H  . cholecalciferol  2,000 Units Oral Daily  . docusate sodium  100 mg Oral BID  . ketorolac  7.5 mg Intravenous Q6H  . LORazepam  1 mg Oral QHS  . mouth rinse  15 mL Mouth Rinse BID  . sertraline  25 mg Oral Daily   Continuous Infusions: . methocarbamol (ROBAXIN)  IV       LOS: 1 day    Time spent: More than 50% of that time was spent in counseling and/or coordination of care.      Shelly Coss, MD [facility] Pager [phone]  If 7PM-7AM, please contact night-coverage [email or web address] Password [email or web address] 01/01/2018, 1:53 PM

## 2018-01-01 NOTE — Evaluation (Signed)
Occupational Therapy Evaluation Patient Details Name: Carol Hardin MRN: [id number] DOB: 1933/06/04 Today's Date: 01/01/2018    History of Present Illness Pt is an 84 y.o. female with a medical history of anxiety, depression, bradycardia, and paroxysmal atrial fibrillation. She presented to the ED s/p fall. Xray revealed L hip fx. She underwent IM nailing 12-31-17.    Clinical Impression   Pt reports she was independent with ADL PTA. Currently pt requires min assist for short distance functional mobility and max assist for LB ADL. Pt planning d/c to SNF for continued rehab prior to return home; agree with ST SNF placement. Pt would benefit from continued skilled OT to address established goals.    Follow Up Recommendations  SNF    Equipment Recommendations  3 in 1 bedside commode    Recommendations for Other Services       Precautions / Restrictions Precautions Precautions: Fall Restrictions Weight Bearing Restrictions: Yes LLE Weight Bearing: Weight bearing as tolerated      Mobility Bed Mobility Overal bed mobility: Needs Assistance Bed Mobility: Supine to Sit     Supine to sit: Min assist;HOB elevated     General bed mobility comments: Assist for LLE to EOB. Pt able to bring trunk into sitting and scoot hips out to EOB. HOB elevated, use of bed rails  Transfers Overall transfer level: Needs assistance Equipment used: Rolling walker (2 wheeled) Transfers: Sit to/from [facility] Sit to Stand: Min assist Stand pivot transfers: Min assist       General transfer comment: Cues for hand placement, in assist to boost up and for balance in standing    Balance Overall balance assessment: Needs assistance Sitting-balance support: Feet supported;No upper extremity supported Sitting balance-Leahy Scale: Good     Standing balance support: Bilateral upper extremity supported Standing balance-Leahy Scale: Poor                              ADL either performed or assessed with clinical judgement   ADL Overall ADL's : Needs assistance/impaired Eating/Feeding: Set up;Sitting   Grooming: Set up;Supervision/safety;Sitting   Upper Body Bathing: Min guard;Sitting   Lower Body Bathing: Maximal assistance;Sit to/from stand   Upper Body Dressing : Min guard;Sitting   Lower Body Dressing: Maximal assistance;Sit to/from stand   Toilet Transfer: Minimal Systems analyst Details (indicate cue type and reason): Simulated with stand pivot transfer from EOB to chair         Functional mobility during ADLs: Minimal assistance;Rolling walker;Cueing for sequencing(increased time and cues for sequencing step pattern)       Vision         Perception     Praxis      Pertinent Vitals/Pain Pain Assessment: Faces Faces Pain Scale: Hurts little more Pain Location: LLE with mobility Pain Descriptors / Indicators: Discomfort;Sore Pain Intervention(s): Monitored during session;Repositioned;Premedicated before session;Ice applied     Hand Dominance     Extremity/Trunk Assessment Upper Extremity Assessment Upper Extremity Assessment: Overall WFL for tasks assessed   Lower Extremity Assessment Lower Extremity Assessment: Defer to PT evaluation   Cervical / Trunk Assessment Cervical / Trunk Assessment: Kyphotic   Communication Communication Communication: No difficulties   Cognition Arousal/Alertness: Awake/alert Behavior During Therapy: WFL for tasks assessed/performed Overall Cognitive Status: Within Functional Limits for tasks assessed  General Comments       Exercises     Shoulder Instructions      Home Living Family/patient expects to be discharged to:: Skilled nursing facility Living Arrangements: Spouse/significant other Available Help at Discharge: Family;Available 24 hours/day Type of Home: House Home Access: Stairs to  enter [employer] of Steps: 2   Home Layout: One level     Bathroom Shower/Tub: Teacher, early years/pre: Standard     Home Equipment: None   Additional Comments: Pt currently living in a multi level house but is scheduled to move into the above noted home environment this week.       Prior Functioning/Environment Level of Independence: Independent                 OT Problem List: Decreased strength;Decreased range of motion;Decreased activity tolerance;Impaired balance (sitting and/or standing);Decreased knowledge of use of DME or AE;Decreased knowledge of precautions;Pain;Increased edema      OT Treatment/Interventions: Self-care/ADL training;DME and/or AE instruction;Therapeutic activities;Patient/family education;Balance training    OT Goals(Current goals can be found in the care plan section) Acute Rehab OT Goals Patient Stated Goal: get better OT Goal Formulation: With patient/family Time For Goal Achievement: 01/15/18 Potential to Achieve Goals: Good ADL Goals Pt Will Perform Lower Body Bathing: with min assist;sit to/from stand Pt Will Perform Lower Body Dressing: with min assist;sit to/from stand Pt Will Transfer to Toilet: with min guard assist;ambulating;bedside commode Pt Will Perform Toileting - Clothing Manipulation and hygiene: with min guard assist;sit to/from stand  OT Frequency: Min 2X/week   Barriers to D/C:            Co-evaluation              AM-PAC PT "6 Clicks" Daily Activity     Outcome Measure Help from another person eating meals?: None Help from another person taking care of personal grooming?: A Little Help from another person toileting, which includes using toliet, bedpan, or urinal?: A Lot Help from another person bathing (including washing, rinsing, drying)?: A Lot Help from another person to put on and taking off regular upper body clothing?: A Little Help from another person to put on and taking off  regular lower body clothing?: A Lot 6 Click Score: 16   End of Session Equipment Utilized During Treatment: Rolling walker;Oxygen  Activity Tolerance: Patient tolerated treatment well Patient left: in chair;with call bell/phone within reach;with family/visitor present  OT Visit Diagnosis: Other abnormalities of gait and mobility (R26.89);Pain Pain - Right/Left: Left Pain - part of body: Hip                Time: 0258-5277 OT Time Calculation (min): 25 min Charges:  OT General Charges $OT Visit: 1 Visit OT Evaluation $OT Eval Moderate Complexity: 1 Mod OT Treatments $Self Care/Home Management : 8-22 mins G-Codes:     Jalisia Puchalski A. Ulice Brilliant, M.S., OTR/L Pager: [place] 01/01/2018, 4:07 PM

## 2018-01-02 ENCOUNTER — Encounter (HOSPITAL_COMMUNITY): Payer: Self-pay | Admitting: Orthopedic Surgery

## 2018-01-02 MED ORDER — BACITRACIN-NEOMYCIN-POLYMYXIN 400-5-5000 EX OINT: TOPICAL_OINTMENT | Freq: Three times a day (TID) | CUTANEOUS | 0 refills | Status: AC

## 2018-01-02 MED ORDER — HYDROCODONE-ACETAMINOPHEN 5-325 MG PO TABS: 1.0000 | ORAL_TABLET | Freq: Four times a day (QID) | ORAL | 0 refills | Status: DC | PRN

## 2018-01-02 MED ORDER — SERTRALINE HCL 25 MG PO TABS
25.0000 mg | ORAL_TABLET | Freq: Every day | ORAL | Status: DC
  Filled 2018-01-02: qty 1

## 2018-01-02 NOTE — Social Work (Addendum)
CSW sent signed 30 day note and clinicals to [facility] to obtain PASSR for SNF placement.  CSW will continue to follow up.  11:10am: CSW met patient at bedside to discuss SNF offers. Pt will discuss with spouse. CSW reiterated that SNF will need to obtain Insurance Auth. Pt agreed to same.  11:20am: PASSR# [id number] E obtained.  Elissa Hefty, LCSW Clinical Social Worker [phone]

## 2018-01-02 NOTE — Clinical Social Work Note (Signed)
Clinical Social Work Assessment  Patient Details  Name: Carol Hardin MRN: [id number] Date of Birth: July 30, 1933  Date of referral:  01/02/18               Reason for consult:  Facility Placement                Permission sought to share information with:  Chartered certified accountant granted to share information::  Yes, Verbal Permission Granted  Name::     Georgine Wiltse  Agency::  SNF  Relationship::     Contact Information:     Housing/Transportation Living arrangements for the past 2 months:  [place] of Information:  Patient Patient Interpreter Needed:  None Criminal Activity/Legal Involvement Pertinent to Current Situation/Hospitalization:  No - Comment as needed Significant Relationships:  Adult Children, Spouse, Other Family Members Lives with:  Spouse Do you feel safe going back to the place where you live?  No Need for family participation in patient care:  Yes (Comment)  Care giving concerns:  Pt has new impairment and PT is recommending SNF.  Social Worker assessment / plan:  CSW met with patient at bedside to discuss CSW role, SNF placement/options, insurance and disposition. CSW answered questions. CSW obtained permission to send to [employer] and [place] area as patient moving to [place] next week. CSW will discuss SNF offers once they are received and then advise patient as SNF will need to obtain Insurance Auth.  Passr pending. CSW following up on same.  Employment status:  Retired Nurse, adult PT Recommendations:  [place] / Referral to community resources:  [place]  Patient/Family's Response to care:  Patient appreciative of [place] meeting to discuss disposition and SNF placement. Pt agreeable to SNF at dc.  Patient/Family's Understanding of and Emotional Response to Diagnosis, Current Treatment, and Prognosis:  Patient agrees with clinical  team's recommendation for SNF as she has new impairment and cannot be cared for at home. Pt resides with spouse and agreeable to SNF at discharge. Pt hopes to return home once she has completed rehabilitative services. No issues or concerns identified.  Emotional Assessment Appearance:  Appears stated age Attitude/Demeanor/Rapport:  (Cooperative) Affect (typically observed):  Accepting, Appropriate Orientation:  Oriented to Situation, Oriented to  Time, Oriented to Place, Oriented to Self Alcohol / Substance use:  Not Applicable Psych involvement (Current and /or in the community):  No (Comment)  Discharge Needs  Concerns to be addressed:  Discharge Planning Concerns Readmission within the last 30 days:  No Current discharge risk:  Dependent with Mobility, Physical Impairment Barriers to Discharge:  No Barriers Identified   Normajean Baxter, LCSW 01/02/2018, 11:12 AM

## 2018-01-02 NOTE — Progress Notes (Signed)
Physical Therapy Treatment Patient Details Name: Carol Hardin MRN: [id number] DOB: Dec 27, 1932 Today's Date: 01/02/2018    History of Present Illness Pt is an 84 y.o. female with a medical history of anxiety, depression, bradycardia, and paroxysmal atrial fibrillation. She presented to the ED s/p fall. Xray revealed L hip fx. She underwent IM nailing 12-31-17.     PT Comments    Continuing work on functional mobility and activity tolerance;  Completed full set of exercises; Unfortunately, amb distance limited by nausea; noted plan for SNF for post-acute rehab; PT in agreement  Follow Up Recommendations  SNF;Supervision/Assistance - 24 hour     Equipment Recommendations  Rolling walker with 5" wheels    Recommendations for Other Services       Precautions / Restrictions Precautions Precautions: Fall Restrictions LLE Weight Bearing: Weight bearing as tolerated    Mobility  Bed Mobility                  Transfers Overall transfer level: Needs assistance Equipment used: Rolling walker (2 wheeled) Transfers: Sit to/from Stand Sit to Stand: Mod assist         General transfer comment: Cues for hand placement, in assist to boost up and for balance in standing  Ambulation/Gait Ambulation/Gait assistance: Min assist Ambulation Distance (Feet): 5 Feet Assistive device: Rolling walker (2 wheeled) Gait Pattern/deviations: Step-to pattern;Decreased stride length;Antalgic Gait velocity: decreased   General Gait Details: verbal cues for sequencing, assist with RW management; gait distance limited by nausea   Stairs            Wheelchair Mobility    Modified Rankin (Stroke Patients Only)       Balance     Sitting balance-Leahy Scale: Good       Standing balance-Leahy Scale: Poor                              Cognition Arousal/Alertness: Awake/alert Behavior During Therapy: WFL for tasks assessed/performed Overall Cognitive Status:  Within Functional Limits for tasks assessed                                        Exercises Total Joint Exercises Ankle Circles/Pumps: AROM;Both;10 reps Quad Sets: AROM;Left;10 reps Towel Squeeze: AROM;Both;10 reps Heel Slides: AAROM;Left;10 reps Hip ABduction/ADduction: AAROM;Left;10 reps Long Arc Quad: AROM;Left;10 reps    General Comments General comments (skin integrity, edema, etc.): Session conducted on Room Air and O2 sats remained greater than or equal to 94%      Pertinent Vitals/Pain Pain Assessment: Faces Faces Pain Scale: Hurts little more Pain Location: LLE with mobility Pain Descriptors / Indicators: Discomfort;Sore Pain Intervention(s): Monitored during session    Home Living                      Prior Function            PT Goals (current goals can now be found in the care plan section) Acute Rehab PT Goals Patient Stated Goal: get better PT Goal Formulation: With patient Time For Goal Achievement: 01/15/18 Potential to Achieve Goals: Good Progress towards PT goals: Progressing toward goals(slowly)    Frequency    Min 3X/week      PT Plan Current plan remains appropriate    Co-evaluation  AM-PAC PT "6 Clicks" Daily Activity  Outcome Measure  Difficulty turning over in bed (including adjusting bedclothes, sheets and blankets)?: A Lot Difficulty moving from lying on back to sitting on the side of the bed? : Unable Difficulty sitting down on and standing up from a chair with arms (e.g., wheelchair, bedside commode, etc,.)?: Unable Help needed moving to and from a bed to chair (including a wheelchair)?: A Little Help needed walking in hospital room?: A Little Help needed climbing 3-5 steps with a railing? : A Lot 6 Click Score: 12    End of Session Equipment Utilized During Treatment: Gait belt Activity Tolerance: Patient tolerated treatment well(though amb distance limited by nausea) Patient left:  in chair;with call bell/phone within reach Nurse Communication: Mobility status PT Visit Diagnosis: Other abnormalities of gait and mobility (R26.89);Difficulty in walking, not elsewhere classified (R26.2);Pain;Muscle weakness (generalized) (M62.81) Pain - Right/Left: Left Pain - part of body: Hip     Time: 8527-7824 PT Time Calculation (min) (ACUTE ONLY): 25 min  Charges:  $Gait Training: 8-22 mins $Therapeutic Exercise: 8-22 mins                    G Codes:       Roney Marion, PT  Acute Rehabilitation Services Pager [phone] Office [place] 01/02/2018, 12:19 PM

## 2018-01-02 NOTE — NC FL2 (Signed)
[place] LEVEL OF CARE SCREENING TOOL     IDENTIFICATION  Patient Name: Carol Hardin Birthdate: [birth year] Sex: female Admission Date (Current Location): 12/31/2017  [facility] and [place] Number:  Herbalist and Address:  [facility]. [facility], [place] [address], [place], [place] [address]      Provider Number: [id number]  Attending Physician Name and Address:  Shelly Coss, MD  Relative Name and Phone Number:  Kali Deadwyler, spouse, [phone]    Current Level of Care: Hospital Recommended Level of Care: [place] Prior Approval Number:    Date Approved/Denied:   PASRR Number: pending  Discharge Plan: SNF    Current Diagnoses: Patient Active Problem List   Diagnosis Date Noted  . Hip fracture ([place]) 12/31/2017  . PAF (paroxysmal atrial fibrillation) ([place])   . NSVT (nonsustained ventricular tachycardia) ([place])   . Mitral regurgitation   . Frequent PVCs   . Bradycardia   . Essential hypertension   . Hypokalemia   . Diverticulitis 08/09/2016  . Diverticulosis of colon 04/22/2016  . Diverticulosis of large intestine without perforation or abscess without bleeding 04/22/2016  . Generalized anxiety disorder 04/22/2016  . Irritable bowel syndrome without diarrhea 04/22/2016  . Mitral valve prolapse 04/22/2016  . Mixed hyperlipidemia 04/22/2016  . Osteopenia 04/22/2016  . Low blood pressure 09/24/2014  . Hypercholesterolemia   . History of depression   . Scoliosis   . Osteoporosis     Orientation RESPIRATION BLADDER Height & Weight     Time, Situation, Place, Self  O2(Nasal Cannula; 2L) Continent Weight: 154 lb (69.9 kg) Height:  5\' 5"  (165.1 cm)  BEHAVIORAL SYMPTOMS/MOOD NEUROLOGICAL BOWEL NUTRITION STATUS      Continent Diet(Regular diet, thin liquids)  AMBULATORY STATUS COMMUNICATION OF NEEDS Skin   Limited Assist Verbally Surgical wounds(Closed incision, thigh, Transparent dressing;Gauze  (Comment);Impregnated gauze (petrolatum). Closed incision left knee,)                       Personal Care Assistance Level of Assistance  Bathing, Feeding, Dressing Bathing Assistance: Limited assistance Feeding assistance: Independent Dressing Assistance: Limited assistance     Functional Limitations Info  Sight, Hearing, Speech Sight Info: Adequate Hearing Info: Adequate Speech Info: Adequate    SPECIAL CARE FACTORS FREQUENCY  PT (By licensed PT), OT (By licensed OT)     PT Frequency: 3x OT Frequency: 3x            Contractures Contractures Info: Not present    Additional Factors Info  Code Status, Allergies, Psychotropic Code Status Info: Full Code Allergies Info: Omeprazole, Flagyl Metronidazole, Aripiprazole, Hylan G-f 20, Other, Paroxetine Hcl, Toprol Xl Metoprolol Succinate, Vancomycin, Penicillins Psychotropic Info: Ativan         Current Medications (01/02/2018):  This is the current hospital active medication list Current Facility-Administered Medications  Medication Dose Route Frequency Provider Last Rate Last Dose  . acetaminophen (TYLENOL) tablet 325-650 mg  325-650 mg Oral Q6H PRN Nicholes Stairs, MD      . aspirin EC tablet 81 mg  81 mg Oral BID Shelly Coss, MD   81 mg at 01/01/18 2330  . cholecalciferol (VITAMIN D) tablet 2,000 Units  2,000 Units Oral Daily Nicholes Stairs, MD   2,000 Units at 01/01/18 [phone]  . docusate sodium (COLACE) capsule 100 mg  100 mg Oral BID Nicholes Stairs, MD   100 mg at 01/01/18 0836  . HYDROcodone-acetaminophen (NORCO) 7.5-325 MG per  tablet 1-2 tablet  1-2 tablet Oral Q4H PRN Nicholes Stairs, MD      . HYDROcodone-acetaminophen (NORCO/VICODIN) 5-325 MG per tablet 1-2 tablet  1-2 tablet Oral Q4H PRN Nicholes Stairs, MD      . LORazepam (ATIVAN) tablet 1 mg  1 mg Oral QHS Nicholes Stairs, MD   1 mg at 01/01/18 2331  . MEDLINE mouth rinse  15 mL Mouth Rinse BID [facility], McAdmits, MD    15 mL at 01/01/18 0839  . methocarbamol (ROBAXIN) tablet 500 mg  500 mg Oral Q6H PRN Nicholes Stairs, MD       Or  . methocarbamol (ROBAXIN) 500 mg in dextrose 5 % 50 mL IVPB  500 mg Intravenous Q6H PRN Nicholes Stairs, MD      . metoCLOPramide [facility]) tablet 5-10 mg  5-10 mg Oral Q8H PRN Nicholes Stairs, MD       Or  . metoCLOPramide [facility]) injection 5-10 mg  5-10 mg Intravenous Q8H PRN Nicholes Stairs, MD      . morphine 2 MG/ML injection 0.5-1 mg  0.5-1 mg Intravenous Q2H PRN Nicholes Stairs, MD      . neomycin-bacitracin-polymyxin (NEOSPORIN) ointment   Topical TID Shelly Coss, MD   1 application at 74/08/14 2331  . ondansetron (ZOFRAN) tablet 4 mg  4 mg Oral Q6H PRN Nicholes Stairs, MD       Or  . ondansetron [facility]) injection 4 mg  4 mg Intravenous Q6H PRN Nicholes Stairs, MD      . polyethylene glycol [facility] / GLYCOLAX) packet 17 g  17 g Oral Daily PRN Nicholes Stairs, MD      . sertraline (ZOLOFT) tablet 25 mg  25 mg Oral Daily Shelly Coss, MD         Discharge Medications: Please see discharge summary for a list of discharge medications.  Relevant Imaging Results:  Relevant Lab Results:   Additional Information SSN: [id number]  Normajean Baxter, LCSW

## 2018-01-02 NOTE — Social Work (Signed)
Clinical Social Worker facilitated patient discharge including contacting patient family and facility to confirm patient discharge plans.  Clinical information faxed to facility and family agreeable with plan.    CSW arranged ambulance transport via [facility] to [place].    RN to call [phone] to give  report prior to discharge. Pt going to Room 303B.  Clinical Social Worker will sign off for now as social work intervention is no longer needed. Please consult [place] again if new need arises.  Elissa Hefty, LCSW Clinical Social Worker [phone]

## 2018-01-02 NOTE — Social Work (Addendum)
CSW discussed SNF offers with patient and she accepts SNF bed at [facility].  CSW then contacted SNF to obtain Insurance auth for SNF placement.  CSW will f/u.  2:00pm SNF confirmed Insurance Auth.  CSW f/u with doctor for disposition.  Elissa Hefty, LCSW Clinical Social Worker [phone]

## 2018-01-02 NOTE — Discharge Summary (Signed)
Physician Discharge Summary  Carol Hardin KDX:[id number] DOB: November 06, 1932 DOA: 12/31/2017  PCP: Leeroy Cha, MD  Admit date: 12/31/2017 Discharge date: 01/02/2018  Admitted From: Home Disposition:  SNF  Discharge Condition: Stable CODE STATUS:Full Diet recommendation: Heart Healthy   Brief/Interim Summary: Patient is a 84 year old female with past medical history of anxiety, depression, bradycardia, paroxysmal A. fib who presented to the emergency department after falling.  No report of loss of consciousness or head injury.  Imagings done in the emergency department was suggestive of left intertrochanteric fracture.  Orthopedics was following and she underwent intra-medullary implant.  Patient was evaluated by PT/OT and recommended skilled nursing facility.  Following problems were addressed during her hospitalization:  Left intertrochanteric fracture: Left hip Xray showed comminuted left intertrochanteric fracture.  Status post intramedullary implant Orthopedics was following. PT/OT consulted.  She needs a skilled nursing facility placement. Orthopedics recommends weightbearing as tolerated.  She will follow-up at orthopedics in 2 weeks for staple removal.  Recommended aspirin 81 mg twice a day for 6 weeks for DVT prophylaxis.  History of paroxysmal A. fib: Follows with cardiology as an outpatient.  Was on bisoprolol for rate control.  Bisoprolol held for bradycardia.  Patient asymptomatic.    Bradycardia: Much improved.Bisoprolol on hold.Patient denies any lightheadedness or dizziness.  Anxiety/depression: On Ativan, Zoloft.  Osteoporosis/osteopenia: Continue vitamin D supplementation    Discharge Diagnoses:  Active Problems:   Generalized anxiety disorder   PAF (paroxysmal atrial fibrillation) (HCC)   Bradycardia   Hip fracture [facility])    Discharge Instructions  Discharge Instructions    Diet - low sodium heart healthy   Complete by:  As directed    Discharge instructions   Complete by:  As directed    1)Follow up with Orthopedics as an outpatient.  Name and number of the provider has been attached. 2)[place] prescribed medications as instructed.   Increase activity slowly   Complete by:  As directed      Allergies as of 01/02/2018      Reactions   Omeprazole Other (See Comments)   siezure   Flagyl [metronidazole] Nausea And Vomiting   Aripiprazole Other (See Comments)   Hylan G-f 20 Other (See Comments)   Other Other (See Comments)   Paroxetine Hcl Other (See Comments)   Headaches (a long time ago- pt doesn't really remember)   Toprol Xl [metoprolol Succinate] Other (See Comments)   Dizziness--pt doesn't really remember    Vancomycin Other (See Comments)   Pt reports that they gave it too fast- she started having itching in the scalp   Penicillins Rash      Medication List    STOP taking these medications   azithromycin 500 MG tablet Commonly known as:  ZITHROMAX   bisoprolol 5 MG tablet Commonly known as:  ZEBETA     TAKE these medications   aspirin EC 81 MG tablet Take 1 tablet (81 mg total) by mouth 2 (two) times daily.   HYDROcodone-acetaminophen 5-325 MG tablet Commonly known as:  NORCO/VICODIN Take 1-2 tablets by mouth every 6 (six) hours as needed for moderate pain.   LORazepam 1 MG tablet Commonly known as:  ATIVAN Take 1 tablet (1 mg total) at bedtime by mouth. 1  qhs  Half  qam   neomycin-bacitracin-polymyxin ointment Commonly known as:  NEOSPORIN Apply topically 3 (three) times daily. Apply to the wound on the left lower extremity just below the knee   polyethylene glycol packet Commonly known as:  MIRALAX / GLYCOLAX  Take 17 g by mouth daily as needed for mild constipation.   PROBIOTIC PO Take 1 tablet by mouth as needed.   Vitamin D 2000 units Caps Take 2,000 Units by mouth daily.      Follow-up Information    Nicholes Stairs, MD In 2 weeks.   Specialty:  Orthopedic Surgery Why:   For suture removal, For wound re-check Contact information: [address] [place] [address] [phone]        Leeroy Cha, MD. Schedule an appointment as soon as possible for a visit in 1 week(s).   Specialty:  Internal Medicine Contact information: [address] [place] [address] [phone]          Allergies  Allergen Reactions  . Omeprazole Other (See Comments)    siezure  . Flagyl [Metronidazole] Nausea And Vomiting  . Aripiprazole Other (See Comments)  . Hylan G-F 20 Other (See Comments)  . Other Other (See Comments)  . Paroxetine Hcl Other (See Comments)    Headaches (a long time ago- pt doesn't really remember)  . Toprol Xl [Metoprolol Succinate] Other (See Comments)    Dizziness--pt doesn't really remember   . Vancomycin Other (See Comments)    Pt reports that they gave it too fast- she started having itching in the scalp  . Penicillins Rash    Consultations: Orthopedics  Procedures/Studies: Dg Chest 1 View  Result Date: 12/31/2017 CLINICAL DATA:  [place] on front porch onto LEFT hip, slipped due to her slippers, denies loss of consciousness, unable to move LEFT leg due to pain EXAM: CHEST  1 VIEW COMPARISON:  08/10/2017 FINDINGS: Enlargement of cardiac silhouette. Tortuous thoracic aorta. Mediastinal contours and pulmonary vascularity otherwise normal. Lungs clear. No infiltrate, pleural effusion or pneumothorax. Bones unremarkable IMPRESSION: Enlargement of cardiac silhouette. No acute abnormalities. Electronically Signed   By: Lavonia Dana M.D.   On: 12/31/2017 16:07   Dg Knee 1-2 Views Left  Result Date: 12/31/2017 CLINICAL DATA:  Left hip pain following a fall on her front porch. EXAM: LEFT KNEE - 1-2 VIEW COMPARISON:  12/23/2017. FINDINGS: Joint space narrowing and associated spur formation involving the lateral and patellofemoral compartments. No fracture or dislocation seen. Stable small effusion.  IMPRESSION: 1. No fracture or dislocation. 2. Stable degenerative changes and small effusion. Electronically Signed   By: Claudie Revering M.D.   On: 12/31/2017 16:07   Dg Knee Complete 4 Views Left  Result Date: 12/23/2017 CLINICAL DATA:  Fall today.  Bilateral knee pain and lacerations EXAM: LEFT KNEE - COMPLETE 4+ VIEW COMPARISON:  None. FINDINGS: Small suprapatellar left knee joint effusion. No fracture or dislocation. No suspicious focal osseous lesion. Mild-to-moderate tricompartmental osteoarthritis, most prominent in the medial compartment. No radiopaque foreign body. IMPRESSION: 1. Small suprapatellar left knee joint effusion. No left knee fracture or dislocation. 2. Mild-to-moderate tricompartmental left knee osteoarthritis. Electronically Signed   By: Ilona Sorrel M.D.   On: 12/23/2017 19:58   Dg Knee Complete 4 Views Right  Result Date: 12/23/2017 CLINICAL DATA:  Fall today.  Bilateral knee pain and lacerations. EXAM: RIGHT KNEE - COMPLETE 4+ VIEW COMPARISON:  None. FINDINGS: No right knee fracture, significant joint effusion or dislocation. No suspicious focal osseous lesion. Mild tricompartmental right knee osteoarthritis, most prominent in the medial compartment. No radiopaque foreign body. IMPRESSION: 1. No right knee fracture, significant joint effusion or dislocation. 2. Mild tricompartmental right knee osteoarthritis. Electronically Signed   By: Janina Mayo.D.  On: 12/23/2017 19:59   Dg C-arm 1-60 Min  Result Date: 12/31/2017 CLINICAL DATA:  Left proximal femoral fracture EXAM: DG C-ARM 61-120 MIN; DG HIP (WITH OR WITHOUT PELVIS) 2-3V LEFT COMPARISON:  12/31/2017 FLUOROSCOPY TIME:  Fluoroscopy Time:  50 seconds Radiation Exposure Index (if provided by the fluoroscopic device): Not available Number of Acquired Spot Images: 6 FINDINGS: Initial images demonstrate the known proximal left femoral fracture. Proximal femoral rod with fixation screw is seen on subsequent images. The fracture  fragments are in near anatomic alignment. IMPRESSION: ORIF of proximal left femoral fracture. Electronically Signed   By: Inez Catalina M.D.   On: 12/31/2017 19:55   Dg Hip Unilat With Pelvis 2-3 Views Left  Result Date: 12/31/2017 CLINICAL DATA:  Left proximal femoral fracture EXAM: DG C-ARM 61-120 MIN; DG HIP (WITH OR WITHOUT PELVIS) 2-3V LEFT COMPARISON:  12/31/2017 FLUOROSCOPY TIME:  Fluoroscopy Time:  50 seconds Radiation Exposure Index (if provided by the fluoroscopic device): Not available Number of Acquired Spot Images: 6 FINDINGS: Initial images demonstrate the known proximal left femoral fracture. Proximal femoral rod with fixation screw is seen on subsequent images. The fracture fragments are in near anatomic alignment. IMPRESSION: ORIF of proximal left femoral fracture. Electronically Signed   By: Inez Catalina M.D.   On: 12/31/2017 19:55   Dg Hip Unilat W Or Wo Pelvis 2-3 Views Left  Result Date: 12/31/2017 CLINICAL DATA:  Left hip pain following a fall on her front porch today. EXAM: DG HIP (WITH OR WITHOUT PELVIS) 2-3V LEFT COMPARISON:  Abdomen and pelvis CT dated 08/19/2017. FINDINGS: Comminuted left intertrochanteric fracture with mild to moderate distraction of the fragments and mild medial displacement of the distal fragment. There is also proximal displacement of the lesser trochanter fragment. IMPRESSION: Comminuted left intertrochanteric fracture. Electronically Signed   By: Claudie Revering M.D.   On: 12/31/2017 16:06    (Echo, Carotid, EGD, Colonoscopy, ERCP)    Subjective: Patient seen and examined the bedside this morning.  Remains comfortable.  No new issues/events.  Stable for discharge to skilled nursing facility today. Discharge Exam: Vitals:   01/02/18 0423 01/02/18 1100  BP: (!) 99/59 (!) 102/44  Pulse: 64 74  Resp: 16 14  Temp: 97.9 F (36.6 C) 99.1 F (37.3 C)  SpO2: 97% 95%   Vitals:   01/01/18 1446 01/01/18 2005 01/02/18 0423 01/02/18 1100  BP: (!) 95/47 (!)  94/54 (!) 99/59 (!) 102/44  Pulse: 66 66 64 74  Resp: 16 16 16 14   Temp: 98.9 F (37.2 C) 98.6 F (37 C) 97.9 F (36.6 C) 99.1 F (37.3 C)  TempSrc: Oral Oral Oral Oral  SpO2: 98% 99% 97% 95%  Weight:      Height:        General: Pt is alert, awake, not in acute distress Cardiovascular: RRR, S1/S2 +, no rubs, no gallops Respiratory: CTA bilaterally, no wheezing, no rhonchi Abdominal: Soft, NT, ND, bowel sounds + Extremities: no edema, no cyanosis, ulceration on her left lower extremity just below the knee, clean surgical wound on the left hip    The results of significant diagnostics from this hospitalization (including imaging, microbiology, ancillary and laboratory) are listed below for reference.     Microbiology: No results found for this or any previous visit (from the past 240 hour(s)).   Labs: BNP (last 3 results) No results for input(s): BNP in the last 8760 hours. Basic Metabolic Panel: Recent Labs  Lab 12/31/17 1512 01/01/18 0652  NA 138  136  K 4.0 3.6  CL 105 105  CO2 24 24  GLUCOSE 93 110*  BUN 11 10  CREATININE 1.01* 0.97  CALCIUM 9.3 8.3*   Liver Function Tests: No results for input(s): AST, ALT, ALKPHOS, BILITOT, PROT, ALBUMIN in the last 168 hours. No results for input(s): LIPASE, AMYLASE in the last 168 hours. No results for input(s): AMMONIA in the last 168 hours. CBC: Recent Labs  Lab 12/31/17 1620 01/01/18 0652  WBC 8.9 6.7  NEUTROABS 6.9  --   HGB 12.3 10.0*  HCT 37.6 30.8*  MCV 95.7 95.4  PLT 208 165   Cardiac Enzymes: Recent Labs  Lab 12/31/17 1539  TROPONINI <0.03   BNP: Invalid input(s): POCBNP CBG: No results for input(s): GLUCAP in the last 168 hours. D-Dimer No results for input(s): DDIMER in the last 72 hours. Hgb A1c No results for input(s): HGBA1C in the last 72 hours. Lipid Profile No results for input(s): CHOL, HDL, LDLCALC, TRIG, CHOLHDL, LDLDIRECT in the last 72 hours. Thyroid function studies No results  for input(s): TSH, T4TOTAL, T3FREE, THYROIDAB in the last 72 hours.  Invalid input(s): FREET3 Anemia work up No results for input(s): VITAMINB12, FOLATE, FERRITIN, TIBC, IRON, RETICCTPCT in the last 72 hours. Urinalysis    Component Value Date/Time   COLORURINE YELLOW 08/09/2016 1721   APPEARANCEUR CLEAR 08/09/2016 1721   LABSPEC >1.046 (H) 08/09/2016 1721   PHURINE 8.0 08/09/2016 1721   GLUCOSEU NEGATIVE 08/09/2016 1721   HGBUR NEGATIVE 08/09/2016 1721   BILIRUBINUR NEGATIVE 08/09/2016 1721   KETONESUR NEGATIVE 08/09/2016 1721   PROTEINUR NEGATIVE 08/09/2016 1721   UROBILINOGEN 0.2 08/16/2008 2359   NITRITE NEGATIVE 08/09/2016 1721   LEUKOCYTESUR NEGATIVE 08/09/2016 1721   Sepsis Labs Invalid input(s): PROCALCITONIN,  WBC,  LACTICIDVEN Microbiology No results found for this or any previous visit (from the past 240 hour(s)).   Time coordinating discharge: Over 30 minutes  SIGNED:   Shelly Coss, MD  [facility] 01/02/2018, 2:13 PM Pager [phone]  If 7PM-7AM, please contact night-coverage [email or web address] Password [email or web address]

## 2018-01-02 NOTE — Progress Notes (Signed)
   Subjective:  Patient reports pain as mild to moderate.  Tolerating PT better today than yesterday.  Still dealing with some O2 demands.  Denies SOB/N/V/CP.  Objective:   VITALS:   Vitals:   01/01/18 0522 01/01/18 1446 01/01/18 2005 01/02/18 0423  BP: (!) 90/54 (!) 95/47 (!) 94/54 (!) 99/59  Pulse: (!) 45 66 66 64  Resp: 16 16 16 16   Temp: 97.6 F (36.4 C) 98.9 F (37.2 C) 98.6 F (37 C) 97.9 F (36.6 C)  TempSrc: Oral Oral Oral Oral  SpO2: 100% 98% 99% 97%  Weight:      Height:        Neurovascular intact Sensation intact distally Intact pulses distally Incision: no drainage and dressings pealing off some.   Compartment soft   Lab Results  Component Value Date   WBC 6.7 01/01/2018   HGB 10.0 (L) 01/01/2018   HCT 30.8 (L) 01/01/2018   MCV 95.4 01/01/2018   PLT 165 01/01/2018   BMET    Component Value Date/Time   NA 136 01/01/2018 0652   K 3.6 01/01/2018 0652   CL 105 01/01/2018 0652   CO2 24 01/01/2018 0652   GLUCOSE 110 (H) 01/01/2018 0652   BUN 10 01/01/2018 0652   CREATININE 0.97 01/01/2018 0652   CREATININE 0.93 (H) 09/22/2016 0933   CALCIUM 8.3 (L) 01/01/2018 0652   GFRNONAA 53 (L) 01/01/2018 0652   GFRAA >60 01/01/2018 3734     Assessment/Plan: 2 Days Post-Op   Active Problems:   Generalized anxiety disorder   PAF (paroxysmal atrial fibrillation) (HCC)   Bradycardia   Hip fracture (HCC)   Advance diet Up with therapy WBAT to LLE SCDs and bid asa for DVT ppx Place tegaderm to dressings PRN and maintain post op bandages. Dc planning pnding progress with therapy.   Nicholes Stairs 01/02/2018, 9:40 AM   Geralynn Rile, MD [phone]

## 2018-01-02 NOTE — Progress Notes (Signed)
This nurse attempted to call report x 2, phone rang out until fast busy signal and call disconnected.  Will await return call from facility for report.  [email or web address]

## 2018-01-02 NOTE — Clinical Social Work Placement (Signed)
   CLINICAL SOCIAL WORK PLACEMENT  NOTE  Date:  01/02/2018  Patient Details  Name: Carol Hardin MRN: [id number] Date of Birth: 1933-01-30  Clinical Social Work is seeking post-discharge placement for this patient at the [place] level of care (*CSW will initial, date and re-position this form in  chart as items are completed):  Yes   Patient/family provided with [place] Work Department's list of facilities offering this level of care within the geographic area requested by the patient (or if unable, by the patient's family).  Yes   Patient/family informed of their freedom to choose among providers that offer the needed level of care, that participate in Medicare, Medicaid or managed care program needed by the patient, have an available bed and are willing to accept the patient.  Yes   Patient/family informed of [facility]'s ownership interest in [facility] and [facility], as well as of the fact that they are under no obligation to receive care at these facilities.  PASRR submitted to EDS on       PASRR number received on 01/02/18     Existing PASRR number confirmed on       FL2 transmitted to all facilities in geographic area requested by pt/family on 01/02/18     FL2 transmitted to all facilities within larger geographic area on       Patient informed that his/her managed care company has contracts with or will negotiate with certain facilities, including the following:        Yes   Patient/family informed of bed offers received.  Patient chooses bed at [facility]     Physician recommends and patient chooses bed at      Patient to be transferred to [facility] on 01/02/18.  Patient to be transferred to facility by [facility]     Patient family notified on 01/02/18 of transfer.  Name of family member notified:  pt responsible for self     PHYSICIAN       Additional Comment:     _______________________________________________ Normajean Baxter, LCSW 01/02/2018, 2:33 PM

## 2018-05-11 DIAGNOSIS — I639 Cerebral infarction, unspecified: Secondary | ICD-10-CM

## 2018-05-11 HISTORY — DX: Cerebral infarction, unspecified: I63.9

## 2018-05-16 DIAGNOSIS — R310 Gross hematuria: Secondary | ICD-10-CM | POA: Diagnosis not present

## 2018-05-19 ENCOUNTER — Other Ambulatory Visit: Payer: Self-pay

## 2018-05-19 ENCOUNTER — Observation Stay (HOSPITAL_COMMUNITY): Payer: Medicare Other

## 2018-05-19 ENCOUNTER — Emergency Department (HOSPITAL_COMMUNITY): Payer: Medicare Other

## 2018-05-19 ENCOUNTER — Observation Stay (HOSPITAL_COMMUNITY)
Admission: EM | Admit: 2018-05-19 | Discharge: 2018-05-20 | Disposition: A | Discharge status: Home / Self Care | Payer: Medicare Other | Attending: Internal Medicine | Admitting: Internal Medicine

## 2018-05-19 ENCOUNTER — Encounter (HOSPITAL_COMMUNITY): Payer: Self-pay | Admitting: Emergency Medicine

## 2018-05-19 DIAGNOSIS — I480 Paroxysmal atrial fibrillation: Secondary | ICD-10-CM | POA: Diagnosis present

## 2018-05-19 DIAGNOSIS — R479 Unspecified speech disturbances: Secondary | ICD-10-CM | POA: Diagnosis not present

## 2018-05-19 DIAGNOSIS — R51 Headache: Secondary | ICD-10-CM | POA: Insufficient documentation

## 2018-05-19 DIAGNOSIS — Z79899 Other long term (current) drug therapy: Secondary | ICD-10-CM | POA: Diagnosis not present

## 2018-05-19 DIAGNOSIS — E7800 Pure hypercholesterolemia, unspecified: Secondary | ICD-10-CM | POA: Diagnosis not present

## 2018-05-19 DIAGNOSIS — Z8673 Personal history of transient ischemic attack (TIA), and cerebral infarction without residual deficits: Secondary | ICD-10-CM | POA: Insufficient documentation

## 2018-05-19 DIAGNOSIS — R29810 Facial weakness: Secondary | ICD-10-CM | POA: Insufficient documentation

## 2018-05-19 DIAGNOSIS — I10 Essential (primary) hypertension: Secondary | ICD-10-CM | POA: Diagnosis not present

## 2018-05-19 DIAGNOSIS — I6340 Cerebral infarction due to embolism of unspecified cerebral artery: Secondary | ICD-10-CM

## 2018-05-19 DIAGNOSIS — Z87891 Personal history of nicotine dependence: Secondary | ICD-10-CM | POA: Insufficient documentation

## 2018-05-19 DIAGNOSIS — I639 Cerebral infarction, unspecified: Secondary | ICD-10-CM | POA: Diagnosis not present

## 2018-05-19 DIAGNOSIS — R4781 Slurred speech: Secondary | ICD-10-CM | POA: Diagnosis not present

## 2018-05-19 DIAGNOSIS — I6389 Other cerebral infarction: Secondary | ICD-10-CM | POA: Diagnosis not present

## 2018-05-19 LAB — APTT: aPTT: 30 seconds (ref 24–36)

## 2018-05-19 LAB — I-STAT TROPONIN, ED: TROPONIN I, POC: 0 ng/mL (ref 0.00–0.08)

## 2018-05-19 LAB — DIFFERENTIAL
Basophils Absolute: 0 10*3/uL (ref 0.0–0.1)
Basophils Relative: 1 %
EOS PCT: 2 %
Eosinophils Absolute: 0.1 10*3/uL (ref 0.0–0.7)
LYMPHS ABS: 1.6 10*3/uL (ref 0.7–4.0)
Lymphocytes Relative: 26 %
MONO ABS: 0.6 10*3/uL (ref 0.1–1.0)
MONOS PCT: 10 %
Neutro Abs: 3.8 10*3/uL (ref 1.7–7.7)
Neutrophils Relative %: 61 %

## 2018-05-19 LAB — COMPREHENSIVE METABOLIC PANEL
ALBUMIN: 4.1 g/dL (ref 3.5–5.0)
ALT: 17 U/L (ref 0–44)
ANION GAP: 6 (ref 5–15)
AST: 22 U/L (ref 15–41)
Alkaline Phosphatase: 62 U/L (ref 38–126)
BILIRUBIN TOTAL: 1.2 mg/dL (ref 0.3–1.2)
BUN: 12 mg/dL (ref 8–23)
CALCIUM: 9.5 mg/dL (ref 8.9–10.3)
CO2: 28 mmol/L (ref 22–32)
Chloride: 108 mmol/L (ref 98–111)
Creatinine, Ser: 0.97 mg/dL (ref 0.44–1.00)
GFR, EST NON AFRICAN AMERICAN: 53 mL/min — AB (ref >60)
GLUCOSE: 98 mg/dL (ref 70–99)
Potassium: 4.1 mmol/L (ref 3.5–5.1)
Sodium: 142 mmol/L (ref 135–145)
TOTAL PROTEIN: 7.6 g/dL (ref 6.5–8.1)

## 2018-05-19 LAB — PROTIME-INR
INR: 1.02
Prothrombin Time: 13.3 seconds (ref 11.4–15.2)

## 2018-05-19 LAB — CBC
HCT: 42.1 % (ref 36.0–46.0)
HEMOGLOBIN: 13.8 g/dL (ref 12.0–15.0)
MCH: 31.4 pg (ref 26.0–34.0)
MCHC: 32.8 g/dL (ref 30.0–36.0)
MCV: 95.9 fL (ref 78.0–100.0)
Platelets: 225 10*3/uL (ref 150–400)
RBC: 4.39 MIL/uL (ref 3.87–5.11)
RDW: 14.5 % (ref 11.5–15.5)
WBC: 6.1 10*3/uL (ref 4.0–10.5)

## 2018-05-19 LAB — CBG MONITORING, ED: Glucose-Capillary: 103 mg/dL — ABNORMAL HIGH (ref 70–99)

## 2018-05-19 LAB — I-STAT CHEM 8, ED
BUN: 10 mg/dL (ref 8–23)
CALCIUM ION: 1.2 mmol/L (ref 1.15–1.40)
Chloride: 106 mmol/L (ref 98–111)
Creatinine, Ser: 1 mg/dL (ref 0.44–1.00)
Glucose, Bld: 94 mg/dL (ref 70–99)
HCT: 40 % (ref 36.0–46.0)
Hemoglobin: 13.6 g/dL (ref 12.0–15.0)
Potassium: 4 mmol/L (ref 3.5–5.1)
Sodium: 142 mmol/L (ref 135–145)
TCO2: 27 mmol/L (ref 22–32)

## 2018-05-19 MED ORDER — POLYETHYLENE GLYCOL 3350 17 G PO PACK: 17.0000 g | PACK | Freq: Every day | ORAL | Status: DC | PRN

## 2018-05-19 MED ORDER — SODIUM CHLORIDE 0.9 % IV SOLN
INTRAVENOUS | Status: DC
  Administered 2018-05-19: 22:00:00 via INTRAVENOUS

## 2018-05-19 MED ORDER — SENNOSIDES-DOCUSATE SODIUM 8.6-50 MG PO TABS
1.0000 | ORAL_TABLET | Freq: Every evening | ORAL | Status: DC | PRN
Start: 2018-05-19 — End: 2018-05-20

## 2018-05-19 MED ORDER — ENOXAPARIN SODIUM 40 MG/0.4ML ~~LOC~~ SOLN: 40.0000 mg | SUBCUTANEOUS | Status: DC

## 2018-05-19 MED ORDER — ACETAMINOPHEN 325 MG PO TABS
650.0000 mg | ORAL_TABLET | ORAL | Status: DC | PRN
  Administered 2018-05-20: 650 mg via ORAL
  Filled 2018-05-19: qty 2

## 2018-05-19 MED ORDER — STROKE: EARLY STAGES OF RECOVERY BOOK
Freq: Once | Status: AC
  Administered 2018-05-19: 1
  Filled 2018-05-19: qty 1

## 2018-05-19 MED ORDER — SERTRALINE HCL 50 MG PO TABS
25.0000 mg | ORAL_TABLET | Freq: Every day | ORAL | Status: DC
  Administered 2018-05-20: 25 mg via ORAL
  Filled 2018-05-19: qty 1

## 2018-05-19 MED ORDER — LORAZEPAM 1 MG PO TABS
1.0000 mg | ORAL_TABLET | Freq: Every day | ORAL | Status: DC
  Administered 2018-05-19: 1 mg via ORAL
  Filled 2018-05-19: qty 1

## 2018-05-19 MED ORDER — APIXABAN 5 MG PO TABS
5.0000 mg | ORAL_TABLET | Freq: Two times a day (BID) | ORAL | Status: DC
  Administered 2018-05-19 – 2018-05-20 (×3): 5 mg via ORAL
  Filled 2018-05-19 (×3): qty 1

## 2018-05-19 MED ORDER — ASPIRIN 325 MG PO TABS
325.0000 mg | ORAL_TABLET | Freq: Once | ORAL | Status: AC
Start: 2018-05-19 — End: 2018-05-19
  Administered 2018-05-19: 325 mg via ORAL
  Filled 2018-05-19: qty 1

## 2018-05-19 MED ORDER — ACETAMINOPHEN 650 MG RE SUPP: 650.0000 mg | RECTAL | Status: DC | PRN

## 2018-05-19 MED ORDER — ACETAMINOPHEN 160 MG/5ML PO SOLN: 650.0000 mg | ORAL | Status: DC | PRN

## 2018-05-19 NOTE — ED Notes (Signed)
Patient transported to CT 

## 2018-05-19 NOTE — ED Triage Notes (Signed)
Pt states she went to bed this morning at 0130 and felt normal. Woke up this morning at 0900 and had slurred speech, at 1030 was drinking coffee and it fell out of her mouth. Had eaten breakfast prior to this with no difficulty. Pt ambulatory to room with steady gait with cane use. Denies feeling weaker on one side. R sided facial droop and slurred speech noted.

## 2018-05-19 NOTE — H&P (Signed)
History and Physical    Carol Hardin NLG:[id number] DOB: 1933/05/06 DOA: 05/19/2018  PCP: Leeroy Cha, MD  Patient coming from: Home  I have personally briefly reviewed patient's old medical records in [place]  Chief Complaint: Difficulty with speech  HPI: Carol Hardin is a 84 y.o. female with medical history significant of atrial fibrillation not on anticoagulation, hypertension, presents to the hospital with difficulty in speech.  Patient woke up at 930 this morning and noted that she was having difficulty finding her words.  When she drank liquids, she noticed that though these were spilling out of her mouth.  She did not have any difficulty holding onto objects with her hand or difficulty with walking.  She did not have any dizziness or double vision.  She did not have any chest pain or shortness of breath.  Due to her concern for her speech, she came to the ER for evaluation.  ED Course: Patient underwent CT scan of the head that was found to be unremarkable.  Vitals were noted to be stable.  Since she woke up with her symptoms, she was outside of any therapeutic window for TPA.  She is been referred for further stroke work-up.  Review of Systems: As per HPI otherwise 10 point review of systems negative.    Past Medical History:  Diagnosis Date  . Anxiety   . Arthritis    "fingers, right toe" (08/09/2016)  . Basal cell carcinoma of lower leg, right   . Bradycardia    a. Holter 08/30/16 showed profound bradycardia down to 30 during awake hours, several 2 second pauses, very frequent PVCs >8000 in 48 hours, and NSVT (longest of 14 beats).  . Cardiomyopathy ([place])    a. EF 40% by echo 08/2016.  . Daily headache    "I usually wake up w/a headache; sometimes it's a migraine" (08/09/2016)  . Depression   . Diverticulosis    Hx. of  . Frequent PVCs   . GERD (gastroesophageal reflux disease)   . Hypercholesterolemia   . Migraine   . Mitral regurgitation    . MVP (mitral valve prolapse)   . NSVT (nonsustained ventricular tachycardia) ([place])    a. first noted event monitor 08/2016.  . Osteoporosis   . PAF (paroxysmal atrial fibrillation) ([place])    a. in afib at time of echo 08/2016.  Marland Kitchen Scoliosis    mild  . Squamous cell carcinoma of neck     Past Surgical History:  Procedure Laterality Date  . BASAL CELL CARCINOMA EXCISION Right    RLE  . BLEPHAROPLASTY    . BREAST BIOPSY Left   . BREAST CYST ASPIRATION Left   . BREAST CYST EXCISION Left   . BUNIONECTOMY Bilateral   . DILATION AND CURETTAGE OF UTERUS    . EXCISIONAL HEMORRHOIDECTOMY    . INTRAMEDULLARY (IM) NAIL INTERTROCHANTERIC Left 12/31/2017   Procedure: INTRAMEDULLARY (IM) NAIL INTERTROCHANTRIC;  Surgeon: Nicholes Stairs, MD;  Location: [facility];  Service: Orthopedics;  Laterality: Left;  . KNEE ARTHROSCOPY Right 04/12/2007  . KNEE ARTHROSCOPY Left   . SQUAMOUS CELL CARCINOMA EXCISION     "neck"  . TONSILLECTOMY     Social history:  reports that she quit smoking about 30 years ago. Her smoking use included cigarettes. She has a 20.00 pack-year smoking history. She has never used smokeless tobacco. She reports that she does not drink alcohol or use drugs.  Allergies  Allergen Reactions  . Omeprazole Other (See Comments)  siezure  . Flagyl [Metronidazole] Nausea And Vomiting  . Aripiprazole Other (See Comments)  . Hylan G-F 20 Other (See Comments)  . Metoprolol   . Other Other (See Comments)  . Paroxetine Hcl Other (See Comments)    Headaches (a long time ago- pt doesn't really remember)  . Sulfa Antibiotics     Colitis   . Toprol Xl [Metoprolol Succinate] Other (See Comments)    Dizziness--pt doesn't really remember   . Vancomycin Other (See Comments)    Pt reports that they gave it too fast- she started having itching in the scalp  . Penicillins Rash    Family History  Problem Relation Age of Onset  . Heart failure Mother   . Leukemia Mother   . Heart  failure Father      Prior to Admission medications   Medication Sig Start Date End Date Taking? Authorizing Provider  bisoprolol (ZEBETA) 5 MG tablet Take 1 tablet by mouth daily. Take 1/2 in the morning and 1/2 in the eventing 05/01/18  Yes [provider]  Cholecalciferol (VITAMIN D) 2000 units CAPS Take 2,000 Units by mouth daily.   Yes [provider]  LORazepam (ATIVAN) 1 MG tablet Take 1 tablet (1 mg total) at bedtime by mouth. 1  qhs  Half  qam 08/17/17  Yes Plovsky, Berneta Sages, MD  polyethylene glycol (MIRALAX / GLYCOLAX) packet Take 17 g by mouth daily as needed for mild constipation.    Yes [provider]  amoxicillin-clavulanate (AUGMENTIN) 875-125 MG tablet amoxicillin 875 mg-potassium clavulanate 125 mg tablet    [provider]  azithromycin (ZITHROMAX) 500 MG tablet azithromycin 500 mg tablet    [provider]  cephALEXin (KEFLEX) 500 MG capsule cephalexin 500 mg capsule    [provider]  ciprofloxacin (CIPRO) 500 MG tablet ciprofloxacin 500 mg tablet    [provider]  sulfamethoxazole-trimethoprim (BACTRIM DS,SEPTRA DS) 800-160 MG tablet sulfamethoxazole 800 mg-trimethoprim 160 mg tablet    [provider]  sulfamethoxazole-trimethoprim (BACTRIM DS,SEPTRA DS) 800-160 MG tablet TK 1 T PO BID 04/11/18   [provider]    Physical Exam: Vitals:   05/19/18 1409 05/19/18 1653 05/19/18 1704 05/19/18 1904  BP: 109/83 (!) 87/44 108/71 103/74  Pulse: 98 79 87 71  Resp: 19 20 20 20   Temp:  98.5 F (36.9 C) 98.3 F (36.8 C) 98.2 F (36.8 C)  TempSrc:  Oral Oral Oral  SpO2: 94% 96% 91% 93%  Weight:  68 kg    Height:  5\' 5"  (1.651 m)      Constitutional: NAD, calm, comfortable Vitals:   05/19/18 1409 05/19/18 1653 05/19/18 1704 05/19/18 1904  BP: 109/83 (!) 87/44 108/71 103/74  Pulse: 98 79 87 71  Resp: 19 20 20 20   Temp:  98.5 F (36.9 C) 98.3 F (36.8 C) 98.2 F (36.8 C)  TempSrc:  Oral  Oral Oral  SpO2: 94% 96% 91% 93%  Weight:  68 kg    Height:  5\' 5"  (1.651 m)     Eyes: PERRL, lids and conjunctivae normal ENMT: Mucous membranes are moist. Posterior pharynx clear of any exudate or lesions.Normal dentition.  Neck: normal, supple, no masses, no thyromegaly Respiratory: clear to auscultation bilaterally, no wheezing, no crackles. Normal respiratory effort. No accessory muscle use.  Cardiovascular: Irregular rate and rhythm, no murmurs / rubs / gallops. No extremity edema. 2+ pedal pulses. No carotid bruits.  Abdomen: no tenderness, no masses palpated. No hepatosplenomegaly. Bowel sounds positive.  Musculoskeletal: no clubbing / cyanosis. No joint deformity upper and lower extremities. Good ROM, no contractures. Normal muscle tone.  Skin: no rashes, lesions, ulcers. No induration Neurologic: CN 2-12 grossly intact with the exception of mild facial droop on the right side.  Speech is normal, although she does struggle at times and seems to be trying to find her words. Sensation intact, DTR normal. Strength 5/5 in all 4.  Psychiatric: Normal judgment and insight. Alert and oriented x 3. Normal mood.    Labs on Admission: I have personally reviewed following labs and imaging studies  CBC: Recent Labs  Lab 05/19/18 1257 05/19/18 1303  WBC 6.1  --   NEUTROABS 3.8  --   HGB 13.8 13.6  HCT 42.1 40.0  MCV 95.9  --   PLT 225  --    Basic Metabolic Panel: Recent Labs  Lab 05/19/18 1257 05/19/18 1303  NA 142 142  K 4.1 4.0  CL 108 106  CO2 28  --   GLUCOSE 98 94  BUN 12 10  CREATININE 0.97 1.00  CALCIUM 9.5  --    GFR: Estimated Creatinine Clearance: 39 mL/min (by C-G formula based on SCr of 1 mg/dL). Liver Function Tests: Recent Labs  Lab 05/19/18 1257  AST 22  ALT 17  ALKPHOS 62  BILITOT 1.2  PROT 7.6  ALBUMIN 4.1   No results for input(s): LIPASE, AMYLASE in the last 168 hours. No results for input(s): AMMONIA in the last 168 hours. Coagulation  Profile: Recent Labs  Lab 05/19/18 1257  INR 1.02   Cardiac Enzymes: No results for input(s): CKTOTAL, CKMB, CKMBINDEX, TROPONINI in the last 168 hours. BNP (last 3 results) No results for input(s): PROBNP in the last 8760 hours. HbA1C: No results for input(s): HGBA1C in the last 72 hours. CBG: Recent Labs  Lab 05/19/18 1253  GLUCAP 103*   Lipid Profile: No results for input(s): CHOL, HDL, LDLCALC, TRIG, CHOLHDL, LDLDIRECT in the last 72 hours. Thyroid Function Tests: No results for input(s): TSH, T4TOTAL, FREET4, T3FREE, THYROIDAB in the last 72 hours. Anemia Panel: No results for input(s): VITAMINB12, FOLATE, FERRITIN, TIBC, IRON, RETICCTPCT in the last 72 hours. Urine analysis:    Component Value Date/Time   COLORURINE YELLOW 08/09/2016 1721   APPEARANCEUR CLEAR 08/09/2016 1721   LABSPEC >1.046 (H) 08/09/2016 1721   PHURINE 8.0 08/09/2016 1721   GLUCOSEU NEGATIVE 08/09/2016 1721   HGBUR NEGATIVE 08/09/2016 1721   BILIRUBINUR NEGATIVE 08/09/2016 1721   KETONESUR NEGATIVE 08/09/2016 1721   PROTEINUR NEGATIVE 08/09/2016 1721   UROBILINOGEN 0.2 08/16/2008 2359   NITRITE NEGATIVE 08/09/2016 1721   LEUKOCYTESUR NEGATIVE 08/09/2016 1721    Radiological Exams on Admission: Ct Head Wo Contrast  Result Date: 05/19/2018 CLINICAL DATA:  Slurred speech with right-sided facial droop EXAM: CT HEAD WITHOUT CONTRAST TECHNIQUE: Contiguous axial images were obtained from the base of the skull through the vertex without intravenous contrast. COMPARISON:  Head CT March 29, 2013 and brain MRI March 31, 2015 FINDINGS: Brain: There is age related volume loss. There is no intracranial mass, hemorrhage, extra-axial fluid collection, or midline shift. There is patchy small vessel disease in the centra semiovale bilaterally, stable in appearance. No new gray-white compartment lesions are demonstrable. No acute infarct is evident. Vascular: No hyperdense vessel. There is no appreciable vascular  calcification. Skull: Bony calvarium appears intact. Sinuses/Orbits: There is mild mucosal thickening in several ethmoid air cells. Other visualized paranasal sinuses are clear. Visualized orbits appear symmetric bilaterally. Other:  Mastoid air cells are clear. IMPRESSION: Age related volume loss with patchy supratentorial small vessel disease, stable. No acute appearing infarct is evident on this study. No mass or hemorrhage. There is mucosal thickening in several ethmoid air cells. Electronically Signed   By: Lowella Grip III M.D.   On: 05/19/2018 13:31   Mr Jodene Nam Head Wo Contrast  Result Date: 05/19/2018 CLINICAL DATA:  Focal neuro deficit, slurred speech and right facial droop per prior head CT report EXAM: MRI HEAD WITHOUT CONTRAST MRA HEAD WITHOUT CONTRAST TECHNIQUE: Multiplanar, multiecho pulse sequences of the brain and surrounding structures were obtained without intravenous contrast. Angiographic images of the head were obtained using MRA technique without contrast. COMPARISON:  03/31/2015 FINDINGS: MRI HEAD FINDINGS Brain: Small area of upper insular and frontal operculum cortical infarct with subjacent cluster of small acute white matter infarcts. FLAIR hyperintensity in the cerebral white matter and pons to a moderate degree for age-attributed to chronic small vessel ischemia. Age normal brain volume. No hemorrhage, hydrocephalus, or collection. Vascular: Arterial findings below. Normal dural venous sinus flow voids Skull and upper cervical spine: No evident marrow lesion. Sinuses/Orbits: Negative MRA HEAD FINDINGS Mild right vertebral artery dominance. The carotid, vertebral, and basilar arteries are smooth and widely patent. No branch occlusion, proximal stenosis, or aneurysm. Probable fenestration at the right carotid terminus base on reformats. IMPRESSION: 1. Cluster of small acute cortical and white matter infarcts along the upper left insula and frontal operculum. 2. Moderate chronic small  vessel ischemia, progressed from 2016. 3. Negative intracranial MRA Electronically Signed   By: Monte Fantasia M.D.   On: 05/19/2018 15:27   Mr Brain Wo Contrast  Result Date: 05/19/2018 CLINICAL DATA:  Focal neuro deficit, slurred speech and right facial droop per prior head CT report EXAM: MRI HEAD WITHOUT CONTRAST MRA HEAD WITHOUT CONTRAST TECHNIQUE: Multiplanar, multiecho pulse sequences of the brain and surrounding structures were obtained without intravenous contrast. Angiographic images of the head were obtained using MRA technique without contrast. COMPARISON:  03/31/2015 FINDINGS: MRI HEAD FINDINGS Brain: Small area of upper insular and frontal operculum cortical infarct with subjacent cluster of small acute white matter infarcts. FLAIR hyperintensity in the cerebral white matter and pons to a moderate degree for age-attributed to chronic small vessel ischemia. Age normal brain volume. No hemorrhage, hydrocephalus, or collection. Vascular: Arterial findings below. Normal dural venous sinus flow voids Skull and upper cervical spine: No evident marrow lesion. Sinuses/Orbits: Negative MRA HEAD FINDINGS Mild right vertebral artery dominance. The carotid, vertebral, and basilar arteries are smooth and widely patent. No branch occlusion, proximal stenosis, or aneurysm. Probable fenestration at the right carotid terminus base on reformats. IMPRESSION: 1. Cluster of small acute cortical and white matter infarcts along the upper left insula and frontal operculum. 2. Moderate chronic small vessel ischemia, progressed from 2016. 3. Negative intracranial MRA Electronically Signed   By: Monte Fantasia M.D.   On: 05/19/2018 15:27    EKG: Independently reviewed.  Atrial fibrillation  Assessment/Plan Active Problems:   Hypercholesterolemia   Essential hypertension   PAF (paroxysmal atrial fibrillation) ([facility])   Stroke (cerebrum) ([place])     1. Acute CVA.  Suspect this is related to her atrial fibrillation.   She will complete stroke work-up with carotid Dopplers and echocardiogram.  Check lipid panel and A1c.  She will be started on anticoagulation.  MRI was reviewed with neurology on-call at [facility] who felt that strokes are small enough to start anticoagulation at this time.  Physical  therapy/speech therapy consultation. 2. Paroxysmal atrial fibrillation.  This was noted on echocardiogram from 2017.  Per previous cardiology notes, anticoagulation was recommended to the patient, but she had refused at that time.  At this time, she is agreeable to give a trial of anticoagulation.  Will start on Eliquis.  She is chronically on bisoprolol for rate control.  Heart rate appears to be stable at this time.  Will hold antihypertensives for the first 24 hours in light of #1 to allow for permissive hypertension 3. Hypertension.  Holding bisoprolol in order to allow for permissive hypertension  DVT prophylaxis: Eliquis Code Status: Full code Family Communication: Discussed with husband at the bedside Disposition Plan: Discharge home once improved Consults called:   Admission status: Observation, telemetry  Kathie Dike MD [facility] Pager [phone]  If 7PM-7AM, please contact night-coverage [email or web address] Password [facility]  05/19/2018, 8:34 PM

## 2018-05-19 NOTE — ED Provider Notes (Signed)
[facility] EMERGENCY DEPARTMENT Provider Note   CSN: [id number] Arrival date & time: 05/19/18  1239     History   Chief Complaint Chief Complaint  Patient presents with  . Aphasia    HPI Carol Hardin is a 84 y.o. female.  She was last known well about 1 AM.  She states she woke up around 915 this morning and had difficulty with her words.  She states she knew what she wanted to stay but could not get her mouth this a.m.  She also noticed when she was drinking liquids it was falling out of her mouth.  Her husband was there also and he did not notice any facial droop.  Her gait was normal.  She feels her symptoms are improved but not fully resolved now.  She is intermittently troubled with headaches.  She had a prior left hip surgery and she has been having to use a cane but she feels her gait is been normal.  She is not on any blood thinners or aspirin.  No chest pain no shortness of breath.  The history is provided by the patient and the spouse.  Cerebrovascular Accident  This is a new problem. Episode onset: work up at 9-930am with symptoms. The problem occurs constantly. The problem has been gradually improving. Associated symptoms include headaches. Pertinent negatives include no chest pain, no abdominal pain and no shortness of breath. Nothing aggravates the symptoms. Nothing relieves the symptoms. She has tried nothing for the symptoms. The treatment provided no relief.    Past Medical History:  Diagnosis Date  . Anxiety   . Arthritis    "fingers, right toe" (08/09/2016)  . Basal cell carcinoma of lower leg, right   . Bradycardia    a. Holter 08/30/16 showed profound bradycardia down to 30 during awake hours, several 2 second pauses, very frequent PVCs >8000 in 48 hours, and NSVT (longest of 14 beats).  . Cardiomyopathy ([place])    a. EF 40% by echo 08/2016.  . Daily headache    "I usually wake up w/a headache; sometimes it's a migraine" (08/09/2016)  . Depression   .  Diverticulosis    Hx. of  . Frequent PVCs   . GERD (gastroesophageal reflux disease)   . Hypercholesterolemia   . Migraine   . Mitral regurgitation   . MVP (mitral valve prolapse)   . NSVT (nonsustained ventricular tachycardia) ([place])    a. first noted event monitor 08/2016.  . Osteoporosis   . PAF (paroxysmal atrial fibrillation) ([place])    a. in afib at time of echo 08/2016.  Marland Kitchen Scoliosis    mild  . Squamous cell carcinoma of neck     Patient Active Problem List   Diagnosis Date Noted  . Hip fracture ([place]) 12/31/2017  . PAF (paroxysmal atrial fibrillation) ([place])   . NSVT (nonsustained ventricular tachycardia) ([place])   . Mitral regurgitation   . Frequent PVCs   . Bradycardia   . Essential hypertension   . Hypokalemia   . Diverticulitis 08/09/2016  . Diverticulosis of colon 04/22/2016  . Diverticulosis of large intestine without perforation or abscess without bleeding 04/22/2016  . Generalized anxiety disorder 04/22/2016  . Irritable bowel syndrome without diarrhea 04/22/2016  . Mitral valve prolapse 04/22/2016  . Mixed hyperlipidemia 04/22/2016  . Osteopenia 04/22/2016  . Low blood pressure 09/24/2014  . Hypercholesterolemia   . History of depression   . Scoliosis   . Osteoporosis     Past Surgical History:  Procedure Laterality Date  . BASAL CELL CARCINOMA EXCISION Right    RLE  . BLEPHAROPLASTY    . BREAST BIOPSY Left   . BREAST CYST ASPIRATION Left   . BREAST CYST EXCISION Left   . BUNIONECTOMY Bilateral   . DILATION AND CURETTAGE OF UTERUS    . EXCISIONAL HEMORRHOIDECTOMY    . INTRAMEDULLARY (IM) NAIL INTERTROCHANTERIC Left 12/31/2017   Procedure: INTRAMEDULLARY (IM) NAIL INTERTROCHANTRIC;  Surgeon: Nicholes Stairs, MD;  Location: [facility];  Service: Orthopedics;  Laterality: Left;  . KNEE ARTHROSCOPY Right 04/12/2007  . KNEE ARTHROSCOPY Left   . SQUAMOUS CELL CARCINOMA EXCISION     "neck"  . TONSILLECTOMY       OB History   None      Home  Medications    Prior to Admission medications   Medication Sig Start Date End Date Taking? Authorizing Provider  Cholecalciferol (VITAMIN D) 2000 units CAPS Take 2,000 Units by mouth daily.    [provider]  HYDROcodone-acetaminophen (NORCO/VICODIN) 5-325 MG tablet Take 1-2 tablets by mouth every 6 (six) hours as needed for moderate pain. 01/02/18   Shelly Coss, MD  LORazepam (ATIVAN) 1 MG tablet Take 1 tablet (1 mg total) at bedtime by mouth. 1  qhs  Half  qam 08/17/17   Plovsky, Berneta Sages, MD  polyethylene glycol (MIRALAX / GLYCOLAX) packet Take 17 g by mouth daily as needed for mild constipation.     [provider]  Probiotic Product (PROBIOTIC PO) Take 1 tablet by mouth as needed.    [provider]    Family History Family History  Problem Relation Age of Onset  . Heart failure Mother   . Leukemia Mother   . Heart failure Father     Social History Social History   Tobacco Use  . Smoking status: Former Smoker    Packs/day: 1.00    Years: 20.00    Pack years: 20.00    Types: Cigarettes    Last attempt to quit: 1989    Years since quitting: 30.6  . Smokeless tobacco: Never Used  Substance Use Topics  . Alcohol use: No  . Drug use: No     Allergies   Omeprazole; Flagyl [metronidazole]; Aripiprazole; Hylan g-f 20; Other; Paroxetine hcl; Sulfa antibiotics; Toprol xl [metoprolol succinate]; Vancomycin; and Penicillins   Review of Systems Review of Systems  Constitutional: Negative for chills and fever.  HENT: Negative for ear pain and sore throat.   Eyes: Negative for visual disturbance.  Respiratory: Negative for cough and shortness of breath.   Cardiovascular: Negative for chest pain and palpitations.  Gastrointestinal: Negative for abdominal pain and vomiting.  Genitourinary: Negative for dysuria and hematuria.  Musculoskeletal: Negative for back pain.  Skin: Negative for color change and rash.  Neurological: Positive for speech  difficulty and headaches. Negative for seizures and syncope.  All other systems reviewed and are negative.    Physical Exam Updated Vital Signs Ht 5\' 5"  (1.651 m)   Wt 68.9 kg   BMI 25.29 kg/m   Physical Exam  Constitutional: She is oriented to person, place, and time. She appears well-developed and well-nourished. No distress.  HENT:  Head: Normocephalic and atraumatic.  Eyes: Conjunctivae are normal.  Neck: Neck supple.  Cardiovascular: Normal rate, normal heart sounds and normal pulses. An irregularly irregular rhythm present.  No murmur heard. Pulmonary/Chest: Effort normal and breath sounds normal. No respiratory distress.  Abdominal: Soft. There is no tenderness.  Musculoskeletal: She exhibits no  edema.  Neurological: She is alert and oriented to person, place, and time. She has normal strength. A cranial nerve deficit (subtle r face droop) is present. No sensory deficit. Gait normal. GCS eye subscore is 4. GCS verbal subscore is 5. GCS motor subscore is 6.  Speech seems fluid to be off the patient does not feel its back to baseline.  She has a subtle right facial droop.  He said the only other maladies finding on neurologic exam currently.  Skin: Skin is warm and dry.  Psychiatric: She has a normal mood and affect.  Nursing note and vitals reviewed.    ED Treatments / Results  Labs (all labs ordered are listed, but only abnormal results are displayed) Labs Reviewed  COMPREHENSIVE METABOLIC PANEL - Abnormal; Notable for the following components:      Result Value   GFR calc non Af Amer 53 (*)    All other components within normal limits  LIPID PANEL - Abnormal; Notable for the following components:   HDL 38 (*)    LDL Cholesterol 100 (*)    All other components within normal limits  CBG MONITORING, ED - Abnormal; Notable for the following components:   Glucose-Capillary 103 (*)    All other components within normal limits  PROTIME-INR  APTT  CBC  DIFFERENTIAL    HEMOGLOBIN A1C  I-STAT TROPONIN, ED  CBG MONITORING, ED  I-STAT CHEM 8, ED    EKG EKG Interpretation  Date/Time:  Friday May 19 2018 12:55:16 EDT Ventricular Rate:  121 PR Interval:    QRS Duration: 106 QT Interval:  377 QTC Calculation: 535 R Axis:   -20 Text Interpretation:  Atrial fibrillation Paired ventricular premature complexes Borderline left axis deviation Borderline T wave abnormalities Prolonged QT interval new from prior 3/19 Confirmed by Aletta Edouard [phone]) on 05/19/2018 1:04:03 PM Also confirmed by Aletta Edouard [phone]), editor Philomena Doheny [phone])  on 05/19/2018 1:12:37 PM   Radiology Ct Head Wo Contrast  Result Date: 05/19/2018 CLINICAL DATA:  Slurred speech with right-sided facial droop EXAM: CT HEAD WITHOUT CONTRAST TECHNIQUE: Contiguous axial images were obtained from the base of the skull through the vertex without intravenous contrast. COMPARISON:  Head CT March 29, 2013 and brain MRI March 31, 2015 FINDINGS: Brain: There is age related volume loss. There is no intracranial mass, hemorrhage, extra-axial fluid collection, or midline shift. There is patchy small vessel disease in the centra semiovale bilaterally, stable in appearance. No new gray-white compartment lesions are demonstrable. No acute infarct is evident. Vascular: No hyperdense vessel. There is no appreciable vascular calcification. Skull: Bony calvarium appears intact. Sinuses/Orbits: There is mild mucosal thickening in several ethmoid air cells. Other visualized paranasal sinuses are clear. Visualized orbits appear symmetric bilaterally. Other: Mastoid air cells are clear. IMPRESSION: Age related volume loss with patchy supratentorial small vessel disease, stable. No acute appearing infarct is evident on this study. No mass or hemorrhage. There is mucosal thickening in several ethmoid air cells. Electronically Signed   By: Lowella Grip III M.D.   On: 05/19/2018 13:31   Mr Virgel Paling TK  Contrast  Result Date: 05/19/2018 CLINICAL DATA:  Focal neuro deficit, slurred speech and right facial droop per prior head CT report EXAM: MRI HEAD WITHOUT CONTRAST MRA HEAD WITHOUT CONTRAST TECHNIQUE: Multiplanar, multiecho pulse sequences of the brain and surrounding structures were obtained without intravenous contrast. Angiographic images of the head were obtained using MRA technique without contrast. COMPARISON:  03/31/2015 FINDINGS: MRI HEAD FINDINGS Brain:  Small area of upper insular and frontal operculum cortical infarct with subjacent cluster of small acute white matter infarcts. FLAIR hyperintensity in the cerebral white matter and pons to a moderate degree for age-attributed to chronic small vessel ischemia. Age normal brain volume. No hemorrhage, hydrocephalus, or collection. Vascular: Arterial findings below. Normal dural venous sinus flow voids Skull and upper cervical spine: No evident marrow lesion. Sinuses/Orbits: Negative MRA HEAD FINDINGS Mild right vertebral artery dominance. The carotid, vertebral, and basilar arteries are smooth and widely patent. No branch occlusion, proximal stenosis, or aneurysm. Probable fenestration at the right carotid terminus base on reformats. IMPRESSION: 1. Cluster of small acute cortical and white matter infarcts along the upper left insula and frontal operculum. 2. Moderate chronic small vessel ischemia, progressed from 2016. 3. Negative intracranial MRA Electronically Signed   By: Monte Fantasia M.D.   On: 05/19/2018 15:27   Mr Brain Wo Contrast  Result Date: 05/19/2018 CLINICAL DATA:  Focal neuro deficit, slurred speech and right facial droop per prior head CT report EXAM: MRI HEAD WITHOUT CONTRAST MRA HEAD WITHOUT CONTRAST TECHNIQUE: Multiplanar, multiecho pulse sequences of the brain and surrounding structures were obtained without intravenous contrast. Angiographic images of the head were obtained using MRA technique without contrast. COMPARISON:   03/31/2015 FINDINGS: MRI HEAD FINDINGS Brain: Small area of upper insular and frontal operculum cortical infarct with subjacent cluster of small acute white matter infarcts. FLAIR hyperintensity in the cerebral white matter and pons to a moderate degree for age-attributed to chronic small vessel ischemia. Age normal brain volume. No hemorrhage, hydrocephalus, or collection. Vascular: Arterial findings below. Normal dural venous sinus flow voids Skull and upper cervical spine: No evident marrow lesion. Sinuses/Orbits: Negative MRA HEAD FINDINGS Mild right vertebral artery dominance. The carotid, vertebral, and basilar arteries are smooth and widely patent. No branch occlusion, proximal stenosis, or aneurysm. Probable fenestration at the right carotid terminus base on reformats. IMPRESSION: 1. Cluster of small acute cortical and white matter infarcts along the upper left insula and frontal operculum. 2. Moderate chronic small vessel ischemia, progressed from 2016. 3. Negative intracranial MRA Electronically Signed   By: Monte Fantasia M.D.   On: 05/19/2018 15:27   US Carotid Bilateral (at [facility] Only)  Result Date: 05/20/2018 CLINICAL DATA:  Stroke EXAM: BILATERAL CAROTID DUPLEX ULTRASOUND TECHNIQUE: Pearline Cables scale imaging, color Doppler and duplex ultrasound were performed of bilateral carotid and vertebral arteries in the neck. COMPARISON:  None. FINDINGS: Criteria: Quantification of carotid stenosis is based on velocity parameters that correlate the residual internal carotid diameter with NASCET-based stenosis levels, using the diameter of the distal internal carotid lumen as the denominator for stenosis measurement. The following velocity measurements were obtained: RIGHT ICA: 80/26 cm/sec CCA: 93/79 cm/sec SYSTOLIC ICA/CCA RATIO:  0.9 ECA:  73 cm/sec LEFT ICA: 101/32 cm/sec CCA: 02/40 cm/sec SYSTOLIC ICA/CCA RATIO:  1.2 ECA:  78 cm/sec RIGHT CAROTID ARTERY: Mild tortuosity. Mild nonocclusive plaque in the  bulb. Intimal thickening. No high-grade stenosis. Normal waveforms and color Doppler signal. RIGHT VERTEBRAL ARTERY: Normal flow direction and waveform. A nonspecific cardiac arrhythmia is noted. LEFT CAROTID ARTERY: Moderate tortuosity. No significant plaque accumulation or stenosis. Normal waveforms and color Doppler signal. LEFT VERTEBRAL ARTERY:  Waveform not conclusively identified. IMPRESSION: 1. Mild right carotid bifurcation plaque resulting in less than 50% diameter stenosis. 2. Nonvisualization of left vertebral arterial waveform. Electronically Signed   By: Lucrezia Europe M.D.   On: 05/20/2018 11:41    Procedures .Critical Care Performed  by: Hayden Rasmussen, MD Authorized by: Hayden Rasmussen, MD   Critical care provider statement:    Critical care time (minutes):  30   Critical care was necessary to treat or prevent imminent or life-threatening deterioration of the following conditions:  CNS failure or compromise   Critical care was time spent personally by me on the following activities:  Discussions with consultants, evaluation of patient's response to treatment, examination of patient, ordering and performing treatments and interventions, ordering and review of laboratory studies, ordering and review of radiographic studies, pulse oximetry, re-evaluation of patient's condition, obtaining history from patient or surrogate, review of old charts and development of treatment plan with patient or surrogate   I assumed direction of critical care for this patient from another provider in my specialty: no     (including critical care time)  Medications Ordered in ED Medications - No data to display   Initial Impression / Assessment and Plan / ED Course  I have reviewed the triage vital signs and the nursing notes.  Pertinent labs & imaging results that were available during my care of the patient were reviewed by me and considered in my medical decision making (see chart for  details).  Clinical Course as of May 22 811  Fri May 19, 5825  7562 84 year old female on no anticoagulation here and what appears to be a new onset A. fib and possible stroke symptoms.  Her symptoms appeared when she woke up this morning at between 9 and 930 and last known well was 1 AM last evening.  She would be outside the window for TPA but I have consulted neurology for tele-neurology evaluation or discussion.   [MB]  1638 Reviewed prior notes and it sounds like her A. fib is been documented in the past.  There was note during an echo a few years ago that she was in A. fib and there was going to be discussion about anticoagulation.   [MB]  1401 discussed with Dr. Braulio Bosch from tele-neurology.  She feels this is likely cardioembolic stroke and she will need to be admitted for further stroke eval.  She recommends MRI, swallow screen, aspirin.  I have paged the hospitalist for admission.  Further discussion with Dr. Bobbie Stack she would not be a TPA candidate due to she is outside the window.  She scored her NIH as a 2.   [MB]  1419 Gust with hospitalist who will evaluate the patient for admission.   [MB]    Clinical Course User Index [MB] Hayden Rasmussen, MD    Final Clinical Impressions(s) / ED Diagnoses   Final diagnoses:  Stroke (cerebrum) [facility])    ED Discharge Orders    None       Hayden Rasmussen, MD 05/21/18 [phone]

## 2018-05-19 NOTE — Progress Notes (Signed)
Called ED for Report, ED Nurse unavailable at this time.

## 2018-05-19 NOTE — ED Notes (Signed)
Tele neurologist seeing pt at this time.

## 2018-05-19 NOTE — Consult Note (Addendum)
[facility] TeleNeurology Consult Services    Date of Service:   05/19/2018 13:18:25  Impression:       .  RO Acute Ischemic Stroke - probably left hemispheric given her presenting right facial droop and right hand weakness.   Mechanism of Stroke: Possible Cardioembolic  Comments: Given her h/o afib and no antiplatelets or anticoagulants, suspect afib is underlying cause of the stroke.  Metrics: Last Known Well: 05/19/2018 01:00:22 [facility] Notification Time: 05/19/2018 13:16:51 Arrival Time: 05/19/2018 12:48:14 Stamp Time: 05/19/2018 13:18:25 Time First Login Attempt: 05/19/2018 13:31:12 Video Start Time: 05/19/2018 13:31:12  Symptoms: garbled speech, dysarthria NIHSS Start Assessment Time: 05/19/2018 13:34:48 Patient is not a candidate for tPA due to outside the treatment window (woke up with symptoms at 915 am), low nihss, mild deficits.  Video End Time: 05/19/2018 13:54:48  CT head showed no acute hemorrhage or acute core infarct. CT head was reviewed.  Advanced imaging was not obtained as the presentation was not suggestive of Large Vessel Occlusive Disease.  ER physician notified of the decision on thrombolytics management.    Our recommendations are outlined below.  Recommendations:      .  Antiplatelet Therapy Recommended     .  Will likely need A/C after her w/u  Routine Consultation with [facility] for Follow up Care   Sign Out:      .  Discussed with Emergency Department Provider    ------------------------------------------------------------------------------  History of Present Illness:   Patient is a 84 years old Female.  Patient was brought by private transportation with symptoms of garbled speech, dysarthria  84 yo F with h/o afib - not on AC p/w slurred speech, LKW last night prior to bed. The patient reports waking aroun d915 this morning and taking a drink of water but the water dripped out of her mouth. She noted  coffee spilled later in the morning too. She also noted that her right hand seemed clumsier and she could not confidently hold a glass. Symptoms did not improve over the course of the day and so she came into the ED. Her facial droop and right arm have returned to normal but her speech is still slurred. No anomia, alexia or trouble with comprehension.  CT head showed no acute hemorrhage or acute core infarct. CT head was reviewed.  Last Seen Normal was within 4.5 hours.  Examination:  1A: Level of Consciousness - Alert; keenly responsive + 0 1B: Ask Month and Age - Both Questions Right + 0 1C: Blink Eyes & Squeeze Hands - Performs Both Tasks + 0 2: Test Horizontal Extraocular Movements - Normal + 0 3: Test Visual Fields - No Visual Loss + 0 4: Test Facial Palsy (Use Grimace if Obtunded) - Normal symmetry + 0 5A: Test Left Arm Motor Drift - No Drift for 10 Seconds + 0 5B: Test Right Arm Motor Drift - No Drift for 10 Seconds + 0 6A: Test Left Leg Motor Drift - No Drift for 5 Seconds + 0 6B: Test Right Leg Motor Drift - No Drift for 5 Seconds + 0 7: Test Limb Ataxia (FNF/Heel-Shin) - No Ataxia + 0 8: Test Sensation - Normal; No sensory loss + 0 9: Test Language/Aphasia - Normal; No aphasia + 0 10: Test Dysarthria - Mild-Moderate Dysarthria: Slurring but can be understood + 1 11: Test Extinction/Inattention - No abnormality + 0  NIHSS Score: 1  Patient was informed the Neurology Consult would happen via TeleHealth consult by way of interactive audio  and video telecommunications and consented to receiving care in this manner.  Due to the immediate potential for life-threatening deterioration due to underlying acute neurologic illness, I spent 35 minutes providing critical care. This time includes time for face to face visit via telemedicine, review of medical records, imaging studies and discussion of findings with providers, the patient and/or family.   Dr Yetta Barre   [facility] [phone]

## 2018-05-19 NOTE — Progress Notes (Signed)
ANTICOAGULATION CONSULT NOTE - Initial Consult  Pharmacy Consult for apixaban Indication: atrial fibrillation  Allergies  Allergen Reactions  . Omeprazole Other (See Comments)    siezure  . Flagyl [Metronidazole] Nausea And Vomiting  . Aripiprazole Other (See Comments)  . Hylan G-F 20 Other (See Comments)  . Metoprolol   . Other Other (See Comments)  . Paroxetine Hcl Other (See Comments)    Headaches (a long time ago- pt doesn't really remember)  . Sulfa Antibiotics     Colitis   . Toprol Xl [Metoprolol Succinate] Other (See Comments)    Dizziness--pt doesn't really remember   . Vancomycin Other (See Comments)    Pt reports that they gave it too fast- she started having itching in the scalp  . Penicillins Rash    Patient Measurements: Height: 5\' 5"  (165.1 cm) Weight: 150 lb (68 kg) IBW/kg (Calculated) : 57  Vital Signs: Temp: 98.2 F (36.8 C) (08/09 1904) Temp Source: Oral (08/09 1904) BP: 103/74 (08/09 1904) Pulse Rate: 71 (08/09 1904)  Labs: Recent Labs    05/19/18 1257 05/19/18 1303  HGB 13.8 13.6  HCT 42.1 40.0  PLT 225  --   APTT 30  --   LABPROT 13.3  --   INR 1.02  --   CREATININE 0.97 1.00    Estimated Creatinine Clearance: 39 mL/min (by C-G formula based on SCr of 1 mg/dL).   Medical History: Past Medical History:  Diagnosis Date  . Anxiety   . Arthritis    "fingers, right toe" (08/09/2016)  . Basal cell carcinoma of lower leg, right   . Bradycardia    a. Holter 08/30/16 showed profound bradycardia down to 30 during awake hours, several 2 second pauses, very frequent PVCs >8000 in 48 hours, and NSVT (longest of 14 beats).  . Cardiomyopathy ([place])    a. EF 40% by echo 08/2016.  . Daily headache    "I usually wake up w/a headache; sometimes it's a migraine" (08/09/2016)  . Depression   . Diverticulosis    Hx. of  . Frequent PVCs   . GERD (gastroesophageal reflux disease)   . Hypercholesterolemia   . Migraine   . Mitral regurgitation    . MVP (mitral valve prolapse)   . NSVT (nonsustained ventricular tachycardia) ([place])    a. first noted event monitor 08/2016.  . Osteoporosis   . PAF (paroxysmal atrial fibrillation) ([place])    a. in afib at time of echo 08/2016.  Marland Kitchen Scoliosis    mild  . Squamous cell carcinoma of neck     Medications:  Facility-Administered Medications Prior to Admission  Medication Dose Route Frequency Provider Last Rate Last Dose  . sertraline (ZOLOFT) 20 MG/ML concentrated solution 25 mg  25 mg Oral Daily Plovsky, Gerald, MD       Medications Prior to Admission  Medication Sig Dispense Refill Last Dose  . bisoprolol (ZEBETA) 5 MG tablet Take 1 tablet by mouth daily. Take 1/2 in the morning and 1/2 in the eventing  8 05/18/2018 at Unknown time  . Cholecalciferol (VITAMIN D) 2000 units CAPS Take 2,000 Units by mouth daily.   05/19/2018 at Unknown time  . LORazepam (ATIVAN) 1 MG tablet Take 1 tablet (1 mg total) at bedtime by mouth. 1  qhs  Half  qam 45 tablet 3 05/18/2018 at Unknown time  . polyethylene glycol (MIRALAX / GLYCOLAX) packet Take 17 g by mouth daily as needed for mild constipation.    Past Month at Unknown  time  . amoxicillin-clavulanate (AUGMENTIN) 875-125 MG tablet amoxicillin 875 mg-potassium clavulanate 125 mg tablet   Completed Course at Unknown time  . azithromycin (ZITHROMAX) 500 MG tablet azithromycin 500 mg tablet   Completed Course at Unknown time  . cephALEXin (KEFLEX) 500 MG capsule cephalexin 500 mg capsule   Completed Course at Unknown time  . ciprofloxacin (CIPRO) 500 MG tablet ciprofloxacin 500 mg tablet   Completed Course at Unknown time  . sulfamethoxazole-trimethoprim (BACTRIM DS,SEPTRA DS) 800-160 MG tablet sulfamethoxazole 800 mg-trimethoprim 160 mg tablet   Completed Course at Unknown time  . sulfamethoxazole-trimethoprim (BACTRIM DS,SEPTRA DS) 800-160 MG tablet TK 1 T PO BID  0 Completed Course at Unknown time   Scheduled:  .  stroke: mapping our early stages of recovery  book   Does not apply Once  . apixaban  5 mg Oral BID  . LORazepam  1 mg Oral QHS  . [START ON 05/20/2018] sertraline  25 mg Oral Daily   Infusions:  . sodium chloride     PRN: acetaminophen **OR** acetaminophen (TYLENOL) oral liquid 160 mg/5 mL **OR** acetaminophen, polyethylene glycol, senna-docusate Anti-infectives (From admission, onward)   None      Assessment: 84 year old female with afib, requiring anticoagulation with eliquis. Pharmacy consulted for anticoagulation management with eliquis.   Goal of Therapy:  anticoagulation Monitor platelets by anticoagulation protocol: Yes   Plan:  apixaban 5mg  po BID, pharmacy to monitor  Donna Christen Frayda Egley 05/19/2018,9:07 PM

## 2018-05-19 NOTE — ED Notes (Signed)
Tele neurology called per Dr. Melina Copa

## 2018-05-19 NOTE — ED Notes (Signed)
MD Lacinda Axon notified by Hinton Rao RN.

## 2018-05-20 ENCOUNTER — Observation Stay (HOSPITAL_BASED_OUTPATIENT_CLINIC_OR_DEPARTMENT_OTHER): Payer: Medicare Other

## 2018-05-20 ENCOUNTER — Observation Stay (HOSPITAL_COMMUNITY): Payer: Medicare Other

## 2018-05-20 DIAGNOSIS — E7800 Pure hypercholesterolemia, unspecified: Secondary | ICD-10-CM | POA: Diagnosis not present

## 2018-05-20 DIAGNOSIS — R479 Unspecified speech disturbances: Secondary | ICD-10-CM | POA: Diagnosis not present

## 2018-05-20 DIAGNOSIS — I340 Nonrheumatic mitral (valve) insufficiency: Secondary | ICD-10-CM | POA: Diagnosis not present

## 2018-05-20 DIAGNOSIS — I10 Essential (primary) hypertension: Secondary | ICD-10-CM | POA: Diagnosis not present

## 2018-05-20 DIAGNOSIS — R29810 Facial weakness: Secondary | ICD-10-CM | POA: Diagnosis not present

## 2018-05-20 DIAGNOSIS — I63231 Cerebral infarction due to unspecified occlusion or stenosis of right carotid arteries: Secondary | ICD-10-CM | POA: Diagnosis not present

## 2018-05-20 DIAGNOSIS — I6340 Cerebral infarction due to embolism of unspecified cerebral artery: Secondary | ICD-10-CM | POA: Diagnosis not present

## 2018-05-20 DIAGNOSIS — I480 Paroxysmal atrial fibrillation: Secondary | ICD-10-CM | POA: Diagnosis not present

## 2018-05-20 LAB — ECHOCARDIOGRAM COMPLETE
Height: 65 in
WEIGHTICAEL: 2400 [oz_av]

## 2018-05-20 LAB — LIPID PANEL
Cholesterol: 153 mg/dL (ref 0–200)
HDL: 38 mg/dL — AB (ref >40)
LDL Cholesterol: 100 mg/dL — ABNORMAL HIGH (ref 0–99)
Total CHOL/HDL Ratio: 4 RATIO
Triglycerides: 76 mg/dL (ref <150)
VLDL: 15 mg/dL (ref 0–40)

## 2018-05-20 MED ORDER — ATORVASTATIN CALCIUM 40 MG PO TABS: 40.0000 mg | ORAL_TABLET | Freq: Every day | ORAL | 0 refills | Status: DC

## 2018-05-20 MED ORDER — ATORVASTATIN CALCIUM 40 MG PO TABS
40.0000 mg | ORAL_TABLET | Freq: Every day | ORAL | Status: DC
  Administered 2018-05-20: 40 mg via ORAL
  Filled 2018-05-20: qty 1

## 2018-05-20 MED ORDER — APIXABAN 5 MG PO TABS: 5.0000 mg | ORAL_TABLET | Freq: Two times a day (BID) | ORAL | 1 refills | Status: DC

## 2018-05-20 NOTE — Progress Notes (Signed)
Discharge instructions gone over with patient and spouse, verbalized understanding. IV removed, patient tolerated procedure well.

## 2018-05-20 NOTE — Plan of Care (Signed)
  Problem: Acute Rehab PT Goals(only PT should resolve) Goal: Pt Will Transfer Bed To Chair/Chair To Bed Outcome: Progressing Flowsheets (Taken 05/20/2018 1258) Pt will Transfer Bed to Chair/Chair to Bed: with modified independence Goal: Pt Will Perform Standing Balance Or Pre-Gait Outcome: Progressing Flowsheets (Taken 05/20/2018 1258) Pt will perform standing balance or pre-gait : with Modified Independent Goal: Pt Will Ambulate Outcome: Progressing Flowsheets (Taken 05/20/2018 1258) Pt will Ambulate: with supervision; 100 feet; with least restrictive assistive device Goal: Pt Will Go Up/Down Stairs Outcome: Progressing Flowsheets (Taken 05/20/2018 1258) Pt will Go Up / Down Stairs: 3-5 stairs; with supervision; with least restrictive assistive device; with rail(s)

## 2018-05-20 NOTE — Progress Notes (Signed)
2D Echocardiogram has been performed.  Carol Hardin 05/20/2018, 7:47 AM

## 2018-05-20 NOTE — Discharge Summary (Signed)
Physician Discharge Summary  Carol Hardin OFB:[id number] DOB: [birth year] DOA: 05/19/2018  PCP: Carol Cha, MD  Admit date: 05/19/2018 Discharge date: 05/20/2018  Admitted From: Home Disposition: Home  Recommendations for Outpatient Follow-up:  1. Follow up with PCP in 1-2 weeks 2. Please obtain BMP/CBC in one week 3. Patient has been referred for outpatient PT and will need outpatient speech therapy as well.  Discharge Condition: Stable CODE STATUS: Full code Diet recommendation: Heart healthy  Brief/Interim Summary: 84 year old female with a history of atrial fibrillation, not on any anticoagulation, hypertension, presented to the hospital with difficulty speech and right facial droop.  She woke up with her symptoms at 9:30 in the morning.  She had difficulty finding her words.  She presented to the ER for evaluation where initial CT scan did not show any acute findings.  MRI brain did confirm underlying stroke.  This is felt to be likely related to her underlying atrial fibrillation.  The patient was started on anticoagulation.  Carotid Dopplers and echocardiogram were found to be unrevealing.  LDL was found to be elevated at 100.  She has been started on a statin.  Patient was noted to have significant bradycardia into the 40s and blood pressure was marginal.  Will discontinue further bisoprolol until she can follow-up with her cardiologist.  She was seen by physical therapy who recommended outpatient physical therapy.  She did pass her swallow screen.  Will recommend outpatient speech therapy referral be made as well.  Patient was ambulated today and did not have any significant difficulty.  She feels ready to discharge home.  Discharge Diagnoses:  Active Problems:   Hypercholesterolemia   Essential hypertension   PAF (paroxysmal atrial fibrillation) (HCC)   Stroke (cerebrum) [facility])    Discharge Instructions  Discharge Instructions    Diet - low sodium heart healthy    Complete by:  As directed    Increase activity slowly   Complete by:  As directed      Allergies as of 05/20/2018      Reactions   Omeprazole Other (See Comments)   siezure   Flagyl [metronidazole] Nausea And Vomiting   Aripiprazole Other (See Comments)   Hylan G-f 20 Other (See Comments)   Metoprolol    Other Other (See Comments)   Paroxetine Hcl Other (See Comments)   Headaches (a long time ago- pt doesn't really remember)   Sulfa Antibiotics    Colitis   Toprol Xl [metoprolol Succinate] Other (See Comments)   Dizziness--pt doesn't really remember    Vancomycin Other (See Comments)   Pt reports that they gave it too fast- she started having itching in the scalp   Penicillins Rash      Medication List    STOP taking these medications   amoxicillin-clavulanate 875-125 MG tablet Commonly known as:  AUGMENTIN   azithromycin 500 MG tablet Commonly known as:  ZITHROMAX   bisoprolol 5 MG tablet Commonly known as:  ZEBETA   cephALEXin 500 MG capsule Commonly known as:  KEFLEX   ciprofloxacin 500 MG tablet Commonly known as:  CIPRO   sulfamethoxazole-trimethoprim 800-160 MG tablet Commonly known as:  BACTRIM DS,SEPTRA DS     TAKE these medications   apixaban 5 MG Tabs tablet Commonly known as:  ELIQUIS Take 1 tablet (5 mg total) by mouth 2 (two) times daily.   atorvastatin 40 MG tablet Commonly known as:  LIPITOR Take 1 tablet (40 mg total) by mouth daily at 6 PM.   LORazepam  1 MG tablet Commonly known as:  ATIVAN Take 1 tablet (1 mg total) at bedtime by mouth. 1  qhs  Half  qam   polyethylene glycol packet Commonly known as:  MIRALAX / GLYCOLAX Take 17 g by mouth daily as needed for mild constipation.   Vitamin D 2000 units Caps Take 2,000 Units by mouth daily.       Allergies  Allergen Reactions  . Omeprazole Other (See Comments)    siezure  . Flagyl [Metronidazole] Nausea And Vomiting  . Aripiprazole Other (See Comments)  . Hylan G-F 20 Other  (See Comments)  . Metoprolol   . Other Other (See Comments)  . Paroxetine Hcl Other (See Comments)    Headaches (a long time ago- pt doesn't really remember)  . Sulfa Antibiotics     Colitis   . Toprol Xl [Metoprolol Succinate] Other (See Comments)    Dizziness--pt doesn't really remember   . Vancomycin Other (See Comments)    Pt reports that they gave it too fast- she started having itching in the scalp  . Penicillins Rash    Consultations:     Procedures/Studies: Ct Head Wo Contrast  Result Date: 05/19/2018 CLINICAL DATA:  Slurred speech with right-sided facial droop EXAM: CT HEAD WITHOUT CONTRAST TECHNIQUE: Contiguous axial images were obtained from the base of the skull through the vertex without intravenous contrast. COMPARISON:  Head CT March 29, 2013 and brain MRI March 31, 2015 FINDINGS: Brain: There is age related volume loss. There is no intracranial mass, hemorrhage, extra-axial fluid collection, or midline shift. There is patchy small vessel disease in the centra semiovale bilaterally, stable in appearance. No new gray-white compartment lesions are demonstrable. No acute infarct is evident. Vascular: No hyperdense vessel. There is no appreciable vascular calcification. Skull: Bony calvarium appears intact. Sinuses/Orbits: There is mild mucosal thickening in several ethmoid air cells. Other visualized paranasal sinuses are clear. Visualized orbits appear symmetric bilaterally. Other: Mastoid air cells are clear. IMPRESSION: Age related volume loss with patchy supratentorial small vessel disease, stable. No acute appearing infarct is evident on this study. No mass or hemorrhage. There is mucosal thickening in several ethmoid air cells. Electronically Signed   By: Lowella Grip III M.D.   On: 05/19/2018 13:31   Mr Jodene Nam Head Wo Contrast  Result Date: 05/19/2018 CLINICAL DATA:  Focal neuro deficit, slurred speech and right facial droop per prior head CT report EXAM: MRI HEAD WITHOUT  CONTRAST MRA HEAD WITHOUT CONTRAST TECHNIQUE: Multiplanar, multiecho pulse sequences of the brain and surrounding structures were obtained without intravenous contrast. Angiographic images of the head were obtained using MRA technique without contrast. COMPARISON:  03/31/2015 FINDINGS: MRI HEAD FINDINGS Brain: Small area of upper insular and frontal operculum cortical infarct with subjacent cluster of small acute white matter infarcts. FLAIR hyperintensity in the cerebral white matter and pons to a moderate degree for age-attributed to chronic small vessel ischemia. Age normal brain volume. No hemorrhage, hydrocephalus, or collection. Vascular: Arterial findings below. Normal dural venous sinus flow voids Skull and upper cervical spine: No evident marrow lesion. Sinuses/Orbits: Negative MRA HEAD FINDINGS Mild right vertebral artery dominance. The carotid, vertebral, and basilar arteries are smooth and widely patent. No branch occlusion, proximal stenosis, or aneurysm. Probable fenestration at the right carotid terminus base on reformats. IMPRESSION: 1. Cluster of small acute cortical and white matter infarcts along the upper left insula and frontal operculum. 2. Moderate chronic small vessel ischemia, progressed from 2016. 3. Negative intracranial MRA Electronically Signed  By: Monte Fantasia M.D.   On: 05/19/2018 15:27   Mr Brain Wo Contrast  Result Date: 05/19/2018 CLINICAL DATA:  Focal neuro deficit, slurred speech and right facial droop per prior head CT report EXAM: MRI HEAD WITHOUT CONTRAST MRA HEAD WITHOUT CONTRAST TECHNIQUE: Multiplanar, multiecho pulse sequences of the brain and surrounding structures were obtained without intravenous contrast. Angiographic images of the head were obtained using MRA technique without contrast. COMPARISON:  03/31/2015 FINDINGS: MRI HEAD FINDINGS Brain: Small area of upper insular and frontal operculum cortical infarct with subjacent cluster of small acute white matter  infarcts. FLAIR hyperintensity in the cerebral white matter and pons to a moderate degree for age-attributed to chronic small vessel ischemia. Age normal brain volume. No hemorrhage, hydrocephalus, or collection. Vascular: Arterial findings below. Normal dural venous sinus flow voids Skull and upper cervical spine: No evident marrow lesion. Sinuses/Orbits: Negative MRA HEAD FINDINGS Mild right vertebral artery dominance. The carotid, vertebral, and basilar arteries are smooth and widely patent. No branch occlusion, proximal stenosis, or aneurysm. Probable fenestration at the right carotid terminus base on reformats. IMPRESSION: 1. Cluster of small acute cortical and white matter infarcts along the upper left insula and frontal operculum. 2. Moderate chronic small vessel ischemia, progressed from 2016. 3. Negative intracranial MRA Electronically Signed   By: Monte Fantasia M.D.   On: 05/19/2018 15:27   US Carotid Bilateral (at [facility] And [facility] Only)  Result Date: 05/20/2018 CLINICAL DATA:  Stroke EXAM: BILATERAL CAROTID DUPLEX ULTRASOUND TECHNIQUE: Pearline Cables scale imaging, color Doppler and duplex ultrasound were performed of bilateral carotid and vertebral arteries in the neck. COMPARISON:  None. FINDINGS: Criteria: Quantification of carotid stenosis is based on velocity parameters that correlate the residual internal carotid diameter with NASCET-based stenosis levels, using the diameter of the distal internal carotid lumen as the denominator for stenosis measurement. The following velocity measurements were obtained: RIGHT ICA: 80/26 cm/sec CCA: 16/10 cm/sec SYSTOLIC ICA/CCA RATIO:  0.9 ECA:  73 cm/sec LEFT ICA: 101/32 cm/sec CCA: 96/04 cm/sec SYSTOLIC ICA/CCA RATIO:  1.2 ECA:  78 cm/sec RIGHT CAROTID ARTERY: Mild tortuosity. Mild nonocclusive plaque in the bulb. Intimal thickening. No high-grade stenosis. Normal waveforms and color Doppler signal. RIGHT VERTEBRAL ARTERY: Normal flow direction and waveform. A  nonspecific cardiac arrhythmia is noted. LEFT CAROTID ARTERY: Moderate tortuosity. No significant plaque accumulation or stenosis. Normal waveforms and color Doppler signal. LEFT VERTEBRAL ARTERY:  Waveform not conclusively identified. IMPRESSION: 1. Mild right carotid bifurcation plaque resulting in less than 50% diameter stenosis. 2. Nonvisualization of left vertebral arterial waveform. Electronically Signed   By: Lucrezia Europe M.D.   On: 05/20/2018 11:41       Subjective: Feeling better.  Husband feels that her speech is improving today.  Has some headache.  She feels that this is from not sleeping well.  Discharge Exam: Vitals:   05/20/18 1655 05/20/18 1658  BP: 102/66 106/78  Pulse: 86 72  Resp:    Temp:    SpO2: 98% 98%   Vitals:   05/20/18 1516 05/20/18 1652 05/20/18 1655 05/20/18 1658  BP: 116/71 (!) 91/59 102/66 106/78  Pulse: (!) 56 97 86 72  Resp: 19 18    Temp:      TempSrc:      SpO2: 97% 98% 98% 98%  Weight:      Height:        General: Pt is alert, awake, not in acute distress Cardiovascular: RRR, S1/S2 +, no rubs, no gallops Respiratory: CTA bilaterally,  no wheezing, no rhonchi Abdominal: Soft, NT, ND, bowel sounds + Extremities: no edema, no cyanosis    The results of significant diagnostics from this hospitalization (including imaging, microbiology, ancillary and laboratory) are listed below for reference.     Microbiology: No results found for this or any previous visit (from the past 240 hour(s)).   Labs: BNP (last 3 results) No results for input(s): BNP in the last 8760 hours. Basic Metabolic Panel: Recent Labs  Lab 05/19/18 1257 05/19/18 1303  NA 142 142  K 4.1 4.0  CL 108 106  CO2 28  --   GLUCOSE 98 94  BUN 12 10  CREATININE 0.97 1.00  CALCIUM 9.5  --    Liver Function Tests: Recent Labs  Lab 05/19/18 1257  AST 22  ALT 17  ALKPHOS 62  BILITOT 1.2  PROT 7.6  ALBUMIN 4.1   No results for input(s): LIPASE, AMYLASE in the last  168 hours. No results for input(s): AMMONIA in the last 168 hours. CBC: Recent Labs  Lab 05/19/18 1257 05/19/18 1303  WBC 6.1  --   NEUTROABS 3.8  --   HGB 13.8 13.6  HCT 42.1 40.0  MCV 95.9  --   PLT 225  --    Cardiac Enzymes: No results for input(s): CKTOTAL, CKMB, CKMBINDEX, TROPONINI in the last 168 hours. BNP: Invalid input(s): POCBNP CBG: Recent Labs  Lab 05/19/18 1253  GLUCAP 103*   D-Dimer No results for input(s): DDIMER in the last 72 hours. Hgb A1c No results for input(s): HGBA1C in the last 72 hours. Lipid Profile Recent Labs    05/20/18 0606  CHOL 153  HDL 38*  LDLCALC 100*  TRIG 76  CHOLHDL 4.0   Thyroid function studies No results for input(s): TSH, T4TOTAL, T3FREE, THYROIDAB in the last 72 hours.  Invalid input(s): FREET3 Anemia work up No results for input(s): VITAMINB12, FOLATE, FERRITIN, TIBC, IRON, RETICCTPCT in the last 72 hours. Urinalysis    Component Value Date/Time   COLORURINE YELLOW 08/09/2016 1721   APPEARANCEUR CLEAR 08/09/2016 1721   LABSPEC >1.046 (H) 08/09/2016 1721   PHURINE 8.0 08/09/2016 1721   GLUCOSEU NEGATIVE 08/09/2016 1721   HGBUR NEGATIVE 08/09/2016 1721   BILIRUBINUR NEGATIVE 08/09/2016 1721   KETONESUR NEGATIVE 08/09/2016 1721   PROTEINUR NEGATIVE 08/09/2016 1721   UROBILINOGEN 0.2 08/16/2008 2359   NITRITE NEGATIVE 08/09/2016 1721   LEUKOCYTESUR NEGATIVE 08/09/2016 1721   Sepsis Labs Invalid input(s): PROCALCITONIN,  WBC,  LACTICIDVEN Microbiology No results found for this or any previous visit (from the past 240 hour(s)).   Time coordinating discharge: 45mins  SIGNED:   Kathie Dike, MD  [facility] 05/20/2018, 6:35 PM Pager   If 7PM-7AM, please contact night-coverage [email or web address] Password [email or web address]

## 2018-05-20 NOTE — Progress Notes (Signed)
EKG completed and placed on pts chart.  

## 2018-05-20 NOTE — Evaluation (Signed)
Physical Therapy Evaluation Patient Details Name: Carol Hardin MRN: [id number] DOB: 1933-05-24 Today's Date: 05/20/2018   History of Present Illness  Carol Hardin is a 84 y.o. female with medical history significant of atrial fibrillation not on anticoagulation, hypertension, presents to the hospital with difficulty in speech.  Patient woke up at 930 this morning and noted that she was having difficulty finding her words.  When she drank liquids, she noticed that though these were spilling out of her mouth.  She did not have any difficulty holding onto objects with her hand or difficulty with walking.  She did not have any dizziness or double vision.  She did not have any chest pain or shortness of breath.  Due to her concern for her speech, she came to the ER for evaluation.    Clinical Impression  Carol Hardin is a 84 y.o. presenting for PT evaluation following admission for acute CVA. She is currently functioning slightly below her baseline of modified independent with a cane for functional gait and mobility. She is independent with bed mobility and currently requires supervision to min guard for for transfers/gait with use of her cane. She is unsteady and requires verbal cues for safe sequencing of cane during gait. She was able to ambulate ~ 50 feet this session however complained of fatigue, dizziness, and a headache that had been present since this morning. She was provided a seat in a wheelchair and her vitals were taken in her room. BP was 97/64 mmHg and HR was 54 bpm. She reported feeling better after ~ 3 minutes seated rest and stated she is just tired. At EOS patient was resting semi-supine in bed with her husband at the bedside. She will benefit from skilled PT to address current impairments and from follow up PT at below venue to address mobility and and balance deficits. Acute PT will follow.     Follow Up Recommendations Outpatient PT    Equipment Recommendations  None  recommended by PT    Recommendations for Other Services       Precautions / Restrictions Precautions Precautions: Fall Restrictions Weight Bearing Restrictions: No      Mobility  Bed Mobility Overal bed mobility: Modified Independent       Transfers Overall transfer level: Needs assistance   Transfers: Sit to/from Stand Sit to Stand: Supervision       Ambulation/Gait Ambulation/Gait assistance: Supervision;Min guard  Gait distance (feet) 50 Assistive device: Straight cane Gait Pattern/deviations: Step-through pattern;Decreased stride length;Drifts right/left;Narrow base of support     General Gait Details: patient with usnteady and slow gait, requires cues for sequencing cane with Lt LE to maintain balance  Stairs  unable to perform due to fatigue today   Modified Rankin (Stroke Patients Only) Modified Rankin (Stroke Patients Only) Pre-Morbid Rankin Score: No symptoms Modified Rankin: No significant disability     Balance Overall balance assessment: Needs assistance   Sitting balance-Leahy Scale: Good       Standing balance-Leahy Scale: Fair Standing balance comment: patient requires external support to prevent LOB and is unsteady with dynamic standing balance (marching)            Pertinent Vitals/Pain Pain Assessment: No/denies pain    Home Living Family/patient expects to be discharged to:: Private residence Living Arrangements: Spouse/significant other Available Help at Discharge: Family;Available 24 hours/day Type of Home: House Home Access: Stairs to enter Entrance Stairs-Rails: Right Entrance Stairs-Number of Steps: 5 Home Layout: One level Home Equipment: Walker - 2  wheels;Walker - 4 wheels;Cane - single point;Shower seat Additional Comments: Patient recently underwent Lt THA and finished with HHPT. She is mabulating with SPC currently and has been active int Environmental education officer. She reports 1 fall on steps within last 6 months.    Prior  Function Level of Independence: Independent with assistive device(s)      Comments: SPC     Hand Dominance   Dominant Hand: Right    Extremity/Trunk Assessment   Upper Extremity Assessment Upper Extremity Assessment: Overall WFL for tasks assessed    Lower Extremity Assessment Lower Extremity Assessment: Generalized weakness    Cervical / Trunk Assessment Cervical / Trunk Assessment: Normal  Communication   Communication: Expressive difficulties  Cognition Arousal/Alertness: Awake/alert Behavior During Therapy: WFL for tasks assessed/performed Overall Cognitive Status: Within Functional Limits for tasks assessed              Assessment/Plan    PT Assessment Patient needs continued PT services  PT Problem List Decreased strength;Decreased knowledge of use of DME;Decreased activity tolerance;Decreased balance;Decreased mobility       PT Treatment Interventions DME instruction;Balance training;Gait training;Stair training;Functional mobility training;Therapeutic exercise;Patient/family education;Neuromuscular re-education    PT Goals (Current goals can be found in the Care Plan section)  Acute Rehab PT Goals Patient Stated Goal: to improve activity tolerance/endurance and return home PT Goal Formulation: With patient/family Time For Goal Achievement: 05/27/18 Potential to Achieve Goals: Good    Frequency Min 4X/week    AM-PAC PT "6 Clicks" Daily Activity  Outcome Measure Difficulty turning over in bed (including adjusting bedclothes, sheets and blankets)?: None Difficulty moving from lying on back to sitting on the side of the bed? : None Difficulty sitting down on and standing up from a chair with arms (e.g., wheelchair, bedside commode, etc,.)?: A Little Help needed moving to and from a bed to chair (including a wheelchair)?: A Little Help needed walking in hospital room?: A Little Help needed climbing 3-5 steps with a railing? : A Little 6 Click Score:  20    End of Session Equipment Utilized During Treatment: Gait belt;Other (comment)( cane) Activity Tolerance: Patient tolerated treatment well;Patient limited by fatigue Patient left: in bed;with family/visitor present;with call bell/phone within reach Nurse Communication: Mobility status PT Visit Diagnosis: Unsteadiness on feet (R26.81);Difficulty in walking, not elsewhere classified (R26.2);Other abnormalities of gait and mobility (R26.89);History of falling (Z91.81);Muscle weakness (generalized) (M62.81)    Time: 2992-4268 PT Time Calculation (min) (ACUTE ONLY): 30 min   Charges:   PT Evaluation $PT Eval Low Complexity: 1 Low PT Treatments $Gait Training: 8-22 mins        Kipp Brood, PT, DPT Physical Therapist with [facility]  05/20/2018 1:31 PM

## 2018-05-20 NOTE — Progress Notes (Signed)
Dr. Myna Hidalgo notified of HR in 40s, as low at 42.  B/P 107/65, Resp. 16, Sp02 92%. Oxygen started at 2L/[place].  No other change noted in condition

## 2018-05-22 LAB — HEMOGLOBIN A1C
HEMOGLOBIN A1C: 5.4 % (ref 4.8–5.6)
Mean Plasma Glucose: 108 mg/dL

## 2018-05-23 DIAGNOSIS — M7062 Trochanteric bursitis, left hip: Secondary | ICD-10-CM | POA: Insufficient documentation

## 2018-05-23 DIAGNOSIS — S72002D Fracture of unspecified part of neck of left femur, subsequent encounter for closed fracture with routine healing: Secondary | ICD-10-CM | POA: Diagnosis not present

## 2018-06-07 DIAGNOSIS — I4891 Unspecified atrial fibrillation: Secondary | ICD-10-CM | POA: Diagnosis not present

## 2018-06-07 DIAGNOSIS — E782 Mixed hyperlipidemia: Secondary | ICD-10-CM | POA: Diagnosis not present

## 2018-06-07 DIAGNOSIS — N3090 Cystitis, unspecified without hematuria: Secondary | ICD-10-CM | POA: Diagnosis not present

## 2018-06-07 DIAGNOSIS — E559 Vitamin D deficiency, unspecified: Secondary | ICD-10-CM | POA: Diagnosis not present

## 2018-06-22 DIAGNOSIS — I4891 Unspecified atrial fibrillation: Secondary | ICD-10-CM | POA: Diagnosis not present

## 2018-06-22 DIAGNOSIS — N3090 Cystitis, unspecified without hematuria: Secondary | ICD-10-CM | POA: Diagnosis not present

## 2018-06-22 DIAGNOSIS — E782 Mixed hyperlipidemia: Secondary | ICD-10-CM | POA: Diagnosis not present

## 2018-06-22 DIAGNOSIS — Z7409 Other reduced mobility: Secondary | ICD-10-CM | POA: Diagnosis not present

## 2018-06-29 DIAGNOSIS — I4891 Unspecified atrial fibrillation: Secondary | ICD-10-CM | POA: Diagnosis not present

## 2018-06-29 DIAGNOSIS — Z6824 Body mass index (BMI) 24.0-24.9, adult: Secondary | ICD-10-CM | POA: Diagnosis not present

## 2018-08-10 ENCOUNTER — Other Ambulatory Visit: Payer: Self-pay

## 2018-08-10 NOTE — Patient Outreach (Signed)
[facility]) Care Management  08/10/2018  Carol Hardin April 13, 1933 [id number]   Medication Adherence call to Carol Hardin spoke with patient she is no longer taking atorvastatin 10 mg doctor took her off. Carol Hardin is showing past due under [place].   [facility] Management Direct Dial [phone]  Fax [phone] [email or web address]

## 2018-09-11 DIAGNOSIS — E559 Vitamin D deficiency, unspecified: Secondary | ICD-10-CM | POA: Diagnosis not present

## 2018-09-11 DIAGNOSIS — N3090 Cystitis, unspecified without hematuria: Secondary | ICD-10-CM | POA: Diagnosis not present

## 2018-09-11 DIAGNOSIS — I4891 Unspecified atrial fibrillation: Secondary | ICD-10-CM | POA: Diagnosis not present

## 2018-09-11 DIAGNOSIS — E782 Mixed hyperlipidemia: Secondary | ICD-10-CM | POA: Diagnosis not present

## 2018-09-11 DIAGNOSIS — Z7409 Other reduced mobility: Secondary | ICD-10-CM | POA: Diagnosis not present

## 2018-09-14 DIAGNOSIS — R944 Abnormal results of kidney function studies: Secondary | ICD-10-CM | POA: Diagnosis not present

## 2018-09-14 DIAGNOSIS — I4891 Unspecified atrial fibrillation: Secondary | ICD-10-CM | POA: Diagnosis not present

## 2018-09-14 DIAGNOSIS — E782 Mixed hyperlipidemia: Secondary | ICD-10-CM | POA: Diagnosis not present

## 2018-09-14 DIAGNOSIS — Z0000 Encounter for general adult medical examination without abnormal findings: Secondary | ICD-10-CM | POA: Diagnosis not present

## 2018-10-16 DIAGNOSIS — Z1231 Encounter for screening mammogram for malignant neoplasm of breast: Secondary | ICD-10-CM | POA: Diagnosis not present

## 2018-10-18 ENCOUNTER — Telehealth: Payer: Self-pay | Admitting: Internal Medicine

## 2018-10-18 ENCOUNTER — Encounter (HOSPITAL_COMMUNITY): Payer: Self-pay | Admitting: Emergency Medicine

## 2018-10-18 ENCOUNTER — Other Ambulatory Visit: Payer: Self-pay

## 2018-10-18 ENCOUNTER — Emergency Department (HOSPITAL_COMMUNITY)
Admission: EM | Admit: 2018-10-18 | Discharge: 2018-10-19 | Disposition: A | Discharge status: Home / Self Care | Payer: Medicare Other | Attending: Emergency Medicine | Admitting: Emergency Medicine

## 2018-10-18 ENCOUNTER — Emergency Department (HOSPITAL_COMMUNITY): Payer: Medicare Other

## 2018-10-18 DIAGNOSIS — Z87891 Personal history of nicotine dependence: Secondary | ICD-10-CM | POA: Diagnosis not present

## 2018-10-18 DIAGNOSIS — I480 Paroxysmal atrial fibrillation: Secondary | ICD-10-CM | POA: Diagnosis not present

## 2018-10-18 DIAGNOSIS — N644 Mastodynia: Secondary | ICD-10-CM | POA: Diagnosis not present

## 2018-10-18 DIAGNOSIS — R072 Precordial pain: Secondary | ICD-10-CM | POA: Diagnosis not present

## 2018-10-18 DIAGNOSIS — R079 Chest pain, unspecified: Secondary | ICD-10-CM | POA: Diagnosis present

## 2018-10-18 LAB — CBC
HCT: 43.7 % (ref 36.0–46.0)
HEMOGLOBIN: 13.8 g/dL (ref 12.0–15.0)
MCH: 30.7 pg (ref 26.0–34.0)
MCHC: 31.6 g/dL (ref 30.0–36.0)
MCV: 97.1 fL (ref 80.0–100.0)
Platelets: 206 10*3/uL (ref 150–400)
RBC: 4.5 MIL/uL (ref 3.87–5.11)
RDW: 14.5 % (ref 11.5–15.5)
WBC: 6.6 10*3/uL (ref 4.0–10.5)
nRBC: 0 % (ref 0.0–0.2)

## 2018-10-18 LAB — BASIC METABOLIC PANEL
Anion gap: 8 (ref 5–15)
BUN: 15 mg/dL (ref 8–23)
CO2: 23 mmol/L (ref 22–32)
Calcium: 8.8 mg/dL — ABNORMAL LOW (ref 8.9–10.3)
Chloride: 109 mmol/L (ref 98–111)
Creatinine, Ser: 0.97 mg/dL (ref 0.44–1.00)
GFR calc Af Amer: >60 mL/min (ref >60)
GFR calc non Af Amer: 54 mL/min — ABNORMAL LOW (ref >60)
Glucose, Bld: 94 mg/dL (ref 70–99)
Potassium: 4.1 mmol/L (ref 3.5–5.1)
Sodium: 140 mmol/L (ref 135–145)

## 2018-10-18 LAB — D-DIMER, QUANTITATIVE: D-Dimer, Quant: 0.46 ug/mL-FEU (ref 0.00–0.50)

## 2018-10-18 LAB — TROPONIN I: Troponin I: <0.03 ng/mL (ref <0.03)

## 2018-10-18 MED ORDER — LIDOCAINE 5 % EX OINT: 1.0000 "application " | TOPICAL_OINTMENT | Freq: Three times a day (TID) | CUTANEOUS | 0 refills | Status: AC | PRN

## 2018-10-18 NOTE — ED Triage Notes (Signed)
Patient reports left breast pain after a mammogram on Monday. No SOB, nausea or chest pain. Patient states the pain is aggravated by deep breathing. Patient states her pain is more in her ribs although she reports bruising to her breast.

## 2018-10-18 NOTE — Telephone Encounter (Signed)
Pt left message stating she's been having some pain in her chest

## 2018-10-18 NOTE — Discharge Instructions (Signed)
You have been seen in the Emergency Department (ED) today for chest pain.  As we have discussed todays test results are normal, and we believe your pain is due to pain/strain and/or inflammation of the muscles and/or cartilage of your chest wall.  We recommend you take Tylenol according to the label instructions.  Read through the included information for additional treatment recommendations and precautions.  Continue to take your regular medications.   Return to the Emergency Department (ED) if you experience any further chest pain/pressure/tightness, difficulty breathing, or sudden sweating, or other symptoms that concern you.

## 2018-10-18 NOTE — Telephone Encounter (Signed)
Patient called to c/o left side breast and chest pain. Pt reports that she had to have a mammogram on Monday. Pt denies pain on the right side of her chest and breast. Pt denies SOB at this time. She does report discoloration to the left breast. Please advise.

## 2018-10-18 NOTE — ED Provider Notes (Signed)
Emergency Department Provider Note   I have reviewed the triage vital signs and the nursing notes.   HISTORY  Chief Complaint Breast Pain   HPI Carol Hardin is a 85 y.o. female with PMH of arthritis, Cardiomyopathy, mitral regurgitation, and PAF on Eliquis presents to the emergency department for evaluation of left sided chest pain since a recent mammogram, two days prior.  Patient states that she does not have a lot of breast tissue and that they had to squeeze more on the left side causing some discomfort.  Since that time she describes a "pulling" sensation under the left breast and slightly more laterally.  Pain is worse with pressing in the area and with deep breathing.  Patient dates that she is not taking a deep breath because of the pain.  She is not experiencing fevers or productive cough.  No prior history of similar symptoms.  No pain symptoms on the right.   Past Medical History:  Diagnosis Date  . Anxiety   . Arthritis    "fingers, right toe" (08/09/2016)  . Basal cell carcinoma of lower leg, right   . Bradycardia    a. Holter 08/30/16 showed profound bradycardia down to 30 during awake hours, several 2 second pauses, very frequent PVCs >8000 in 48 hours, and NSVT (longest of 14 beats).  . Cardiomyopathy ([place])    a. EF 40% by echo 08/2016.  . Daily headache    "I usually wake up w/a headache; sometimes it's a migraine" (08/09/2016)  . Depression   . Diverticulosis    Hx. of  . Frequent PVCs   . GERD (gastroesophageal reflux disease)   . Hypercholesterolemia   . Migraine   . Mitral regurgitation   . MVP (mitral valve prolapse)   . NSVT (nonsustained ventricular tachycardia) ([place])    a. first noted event monitor 08/2016.  . Osteoporosis   . PAF (paroxysmal atrial fibrillation) ([place])    a. in afib at time of echo 08/2016.  Marland Kitchen Scoliosis    mild  . Squamous cell carcinoma of neck     Patient Active Problem List   Diagnosis Date Noted  . Stroke  (cerebrum) ([place]) 05/19/2018  . Hip fracture ([place]) 12/31/2017  . PAF (paroxysmal atrial fibrillation) ([place])   . NSVT (nonsustained ventricular tachycardia) ([place])   . Mitral regurgitation   . Frequent PVCs   . Bradycardia   . Essential hypertension   . Hypokalemia   . Diverticulitis 08/09/2016  . Diverticulosis of colon 04/22/2016  . Diverticulosis of large intestine without perforation or abscess without bleeding 04/22/2016  . Generalized anxiety disorder 04/22/2016  . Irritable bowel syndrome without diarrhea 04/22/2016  . Mitral valve prolapse 04/22/2016  . Mixed hyperlipidemia 04/22/2016  . Osteopenia 04/22/2016  . Low blood pressure 09/24/2014  . Hypercholesterolemia   . History of depression   . Scoliosis   . Osteoporosis     Past Surgical History:  Procedure Laterality Date  . BASAL CELL CARCINOMA EXCISION Right    RLE  . BLEPHAROPLASTY    . BREAST BIOPSY Left   . BREAST CYST ASPIRATION Left   . BREAST CYST EXCISION Left   . BUNIONECTOMY Bilateral   . DILATION AND CURETTAGE OF UTERUS    . EXCISIONAL HEMORRHOIDECTOMY    . INTRAMEDULLARY (IM) NAIL INTERTROCHANTERIC Left 12/31/2017   Procedure: INTRAMEDULLARY (IM) NAIL INTERTROCHANTRIC;  Surgeon: Nicholes Stairs, MD;  Location: [facility];  Service: Orthopedics;  Laterality: Left;  . KNEE ARTHROSCOPY Right 04/12/2007  .  KNEE ARTHROSCOPY Left   . SQUAMOUS CELL CARCINOMA EXCISION     "neck"  . TONSILLECTOMY      Allergies Omeprazole; Flagyl [metronidazole]; Aripiprazole; Hylan g-f 20; Paroxetine hcl; Statins; Sulfa antibiotics; Toprol xl [metoprolol succinate]; Vancomycin; and Penicillins  Family History  Problem Relation Age of Onset  . Heart failure Mother   . Leukemia Mother   . Heart failure Father     Social History Social History   Tobacco Use  . Smoking status: Former Smoker    Packs/day: 1.00    Years: 20.00    Pack years: 20.00    Types: Cigarettes    Last attempt to quit: 1989    Years since  quitting: 31.0  . Smokeless tobacco: Never Used  Substance Use Topics  . Alcohol use: No  . Drug use: No    Review of Systems  Constitutional: No fever/chills Eyes: No visual changes. ENT: No sore throat. Cardiovascular: Positive chest pain. Respiratory: Denies shortness of breath. Gastrointestinal: No abdominal pain.  No nausea, no vomiting.  No diarrhea.  No constipation. Genitourinary: Negative for dysuria. Musculoskeletal: Negative for back pain. Skin: Negative for rash. Neurological: Negative for headaches, focal weakness or numbness.  10-point ROS otherwise negative.  ____________________________________________   PHYSICAL EXAM:  VITAL SIGNS: ED Triage Vitals  Enc Vitals Group     BP 10/18/18 1452 104/86     Pulse Rate 10/18/18 1452 92     Resp 10/18/18 1452 18     Temp 10/18/18 1452 98 F (36.7 C)     Temp Source 10/18/18 1452 Temporal     SpO2 10/18/18 1452 98 %     Weight 10/18/18 1455 152 lb (68.9 kg)     Height 10/18/18 1455 5\' 5"  (1.651 m)     Pain Score 10/18/18 1454 8    Constitutional: Alert and oriented. Well appearing and in no acute distress. Eyes: Conjunctivae are normal. Head: Atraumatic. Nose: No congestion/rhinnorhea. Mouth/Throat: Mucous membranes are moist.  Neck: No stridor.  Cardiovascular: A-fib. Good peripheral circulation. Grossly normal heart sounds.   Respiratory: Normal respiratory effort.  No retractions. Lungs CTAB. Gastrointestinal: Soft and nontender. No distention.  Musculoskeletal: No lower extremity tenderness nor edema. No gross deformities of extremities. Neurologic:  Normal speech and language. No gross focal neurologic deficits are appreciated.  Skin:  Skin is warm, dry and intact. Mild erythema over the left lateral chest wall. No bruising but tenderness to palpation. No warmth, induration, or cellulitis.   ____________________________________________   LABS (all labs ordered are listed, but only abnormal results are  displayed)  Labs Reviewed  BASIC METABOLIC PANEL - Abnormal; Notable for the following components:      Result Value   Calcium 8.8 (*)    GFR calc non Af Amer 54 (*)    All other components within normal limits  CBC  TROPONIN I  D-DIMER, QUANTITATIVE (NOT AT [facility])   ____________________________________________  EKG   EKG Interpretation  Date/Time:  Wednesday October 18 2018 14:58:59 EST Ventricular Rate:  118 PR Interval:    QRS Duration: 100 QT Interval:  354 QTC Calculation: 496 R Axis:   -28 Text Interpretation:  Atrial fibrillation Nonspecific ST and T wave abnormality Abnormal ECG No STEMI.  Confirmed by Nanda Quinton [id number]) on 10/18/2018 3:16:05 PM       ____________________________________________  RADIOLOGY  Dg Chest 2 View  Result Date: 10/18/2018 CLINICAL DATA:  LEFT breast pain after a mammogram. EXAM: CHEST - 2 VIEW COMPARISON:  12/31/2017 FINDINGS: The heart is enlarged. Calcified aorta. No consolidation or edema. No effusion or pneumothorax. Osteopenia without thoracic compression fracture. IMPRESSION: Cardiomegaly.  No active cardiopulmonary disease. Electronically Signed   By: Staci Righter M.D.   On: 10/18/2018 16:02    ____________________________________________   PROCEDURES  Procedure(s) performed:   Procedures  None ____________________________________________   INITIAL IMPRESSION / ASSESSMENT AND PLAN / ED COURSE  Pertinent labs & imaging results that were available during my care of the patient were reviewed by me and considered in my medical decision making (see chart for details).  Patient presents to the emergency department for evaluation of left sided chest pain.  No tenderness in the breast tissue.  There is mild erythema there with tenderness to palpation.  Palpation along the left lateral chest wall causes the patient to wince and she describes this as her pain.  Patient does have several risk factors for acute coronary syndrome and  PE.  She is on Eliquis but states she occasionally misses doses.  I do plan for chest x-ray and testing including troponin and d-dimer.  I have discussed this with the patient and husband at bedside.   Troponin and D-dimer are negative. No evidence on exam to suspect breast abscess. Plan for lidocaine ointment to the area and PCP follow up. Discussed PCP follow up and ED return precautions in detail.  ____________________________________________  FINAL CLINICAL IMPRESSION(S) / ED DIAGNOSES  Final diagnoses:  Precordial chest pain    NEW OUTPATIENT MEDICATIONS STARTED DURING THIS VISIT:  Discharge Medication List as of 10/18/2018  4:40 PM    START taking these medications   Details  lidocaine (XYLOCAINE) 5 % ointment Apply 1 application topically 3 (three) times daily as needed for up to 7 days., Starting Wed 10/18/2018, Until Wed 10/25/2018, Print        Note:  This document was prepared using Dragon voice recognition software and may include unintentional dictation errors.  Nanda Quinton, MD Emergency Medicine    Long, [facility], MD 10/19/18 [phone]

## 2018-10-18 NOTE — Telephone Encounter (Signed)
Pt went to ER

## 2018-10-24 ENCOUNTER — Encounter: Payer: Self-pay | Admitting: *Deleted

## 2018-10-24 ENCOUNTER — Ambulatory Visit: Payer: Medicare Other | Admitting: Cardiology

## 2018-10-24 ENCOUNTER — Encounter: Payer: Self-pay | Admitting: Cardiology

## 2018-10-24 VITALS — BP 108/73 | HR 110 | Ht 65.0 in | Wt 146.2 lb

## 2018-10-24 DIAGNOSIS — I493 Ventricular premature depolarization: Secondary | ICD-10-CM

## 2018-10-24 DIAGNOSIS — I480 Paroxysmal atrial fibrillation: Secondary | ICD-10-CM | POA: Diagnosis not present

## 2018-10-24 DIAGNOSIS — I340 Nonrheumatic mitral (valve) insufficiency: Secondary | ICD-10-CM | POA: Diagnosis not present

## 2018-10-24 DIAGNOSIS — I495 Sick sinus syndrome: Secondary | ICD-10-CM

## 2018-10-24 NOTE — Patient Instructions (Signed)
Your physician recommends that you schedule a follow-up appointment in: [address] [place]  Your physician recommends that you continue on your current medications as directed. Please refer to the Current Medication list given to you today.  You have been referred to DR [facility]   Thank you for choosing [facility]!!

## 2018-10-24 NOTE — Progress Notes (Signed)
Clinical Summary Carol Hardin is a 85 y.o.female  Previously seen by Dr Mare Ferrari and Dr Meda Coffee, as well as Dr Wynonia Lawman. This is our first visit together.    1. Chest pain - seen in ER Jan 2020 with left chest/breast pain - recent mammogram prior to symptoms - pain worst with palpation, deep breathing - D-dimer negative, trop neg. CXR no acute process  - no ongoing significant symptoms.    2. Mitral valve prolapse - 05/2018 echo LVEF 55-60%, mild to moderate MR - no significant symptoms.   3. Palpitations/Bradycardia/PVCs/Afib - monitor 08/2016 showed severe bradycardia to 30s, several 2 second pauses, frequent PVCs and up to 14 beats of NSVT - beta blocker was to be stopped but she continued - patient was to have a lexiscan for the NSVT but appears she refused.   - seen by EP Dr Lovena Le for sinus node dysfunction, PACS, PVCs - there has been some question about possible afib but not cleart documneted, patient previously refused anticoag.   - she later had a CVA, started on eliquis after.  - had some issues with colits she attributed to both her atorvastatin and eliquis, appears her eliquis was lowered to 2.5mg  bid by another provider     4. CVA - admit 05/2018 with CVA - started on anticoag for afib at that time, she had previously refused. N - off atorvastatin, she reports GI side effects.   Past Medical History:  Diagnosis Date  . Anxiety   . Arthritis    "fingers, right toe" (08/09/2016)  . Basal cell carcinoma of lower leg, right   . Bradycardia    a. Holter 08/30/16 showed profound bradycardia down to 30 during awake hours, several 2 second pauses, very frequent PVCs >8000 in 48 hours, and NSVT (longest of 14 beats).  . Cardiomyopathy ([place])    a. EF 40% by echo 08/2016.  . Daily headache    "I usually wake up w/a headache; sometimes it's a migraine" (08/09/2016)  . Depression   . Diverticulosis    Hx. of  . Frequent PVCs   . GERD (gastroesophageal reflux  disease)   . Hypercholesterolemia   . Migraine   . Mitral regurgitation   . MVP (mitral valve prolapse)   . NSVT (nonsustained ventricular tachycardia) ([place])    a. first noted event monitor 08/2016.  . Osteoporosis   . PAF (paroxysmal atrial fibrillation) ([place])    a. in afib at time of echo 08/2016.  Marland Kitchen Scoliosis    mild  . Squamous cell carcinoma of neck      Allergies  Allergen Reactions  . Omeprazole Other (See Comments)    siezure  . Flagyl [Metronidazole] Nausea And Vomiting  . Aripiprazole Other (See Comments)    Unknown reaction  . Hylan G-F 20 Other (See Comments)    Unknown reaction  . Paroxetine Hcl Other (See Comments)    Headaches (a long time ago- pt doesn't really remember)  . Statins Other (See Comments)    No specific reaction given-patient states that she was advised not to take by physician  . Sulfa Antibiotics Diarrhea and Other (See Comments)    Colitis   . Toprol Xl [Metoprolol Succinate] Other (See Comments)    Dizziness--pt doesn't really remember   . Vancomycin Itching and Other (See Comments)    Pt reports that they gave it too fast- she started having itching in the scalp  . Penicillins Rash    DID THE REACTION  INVOLVE: Swelling of the face/tongue/throat, SOB, or low BP? No Sudden or severe rash/hives, skin peeling, or the inside of the mouth or nose? Yes Did it require medical treatment? Unknown When did it last happen?Over 10 years If all above answers are "NO", may proceed with cephalosporin use.      Current Outpatient Medications  Medication Sig Dispense Refill  . apixaban (ELIQUIS) 2.5 MG TABS tablet Take 2.5 mg by mouth 2 (two) times daily.    . bisoprolol (ZEBETA) 5 MG tablet Take 2.5 mg by mouth at bedtime.    . Glycerin-Hypromellose-PEG 400 (HM DRY EYE RELIEF) 0.2-0.2-1 % SOLN Apply 1-2 drops to eye daily as needed (for dry eye relief).    Marland Kitchen lidocaine (XYLOCAINE) 5 % ointment Apply 1 application topically 3 (three) times  daily as needed for up to 7 days. 21 g 0  . LORazepam (ATIVAN) 1 MG tablet Take 1 tablet (1 mg total) at bedtime by mouth. 1  qhs  Half  qam (Patient taking differently: Take 1 mg by mouth at bedtime. ) 45 tablet 3  . polyethylene glycol (MIRALAX / GLYCOLAX) packet Take 17 g by mouth daily as needed for mild constipation.     . Probiotic Product (PROBIOTIC DAILY PO) Take 1 capsule by mouth daily.     Current Facility-Administered Medications  Medication Dose Route Frequency Provider Last Rate Last Dose  . sertraline (ZOLOFT) 20 MG/ML concentrated solution 25 mg  25 mg Oral Daily [name], MD         Past Surgical History:  Procedure Laterality Date  . BASAL CELL CARCINOMA EXCISION Right    RLE  . BLEPHAROPLASTY    . BREAST BIOPSY Left   . BREAST CYST ASPIRATION Left   . BREAST CYST EXCISION Left   . BUNIONECTOMY Bilateral   . DILATION AND CURETTAGE OF UTERUS    . EXCISIONAL HEMORRHOIDECTOMY    . INTRAMEDULLARY (IM) NAIL INTERTROCHANTERIC Left 12/31/2017   Procedure: INTRAMEDULLARY (IM) NAIL INTERTROCHANTRIC;  Surgeon: Nicholes Stairs, MD;  Location: [facility];  Service: Orthopedics;  Laterality: Left;  . KNEE ARTHROSCOPY Right 04/12/2007  . KNEE ARTHROSCOPY Left   . SQUAMOUS CELL CARCINOMA EXCISION     "neck"  . TONSILLECTOMY       Allergies  Allergen Reactions  . Omeprazole Other (See Comments)    siezure  . Flagyl [Metronidazole] Nausea And Vomiting  . Aripiprazole Other (See Comments)    Unknown reaction  . Hylan G-F 20 Other (See Comments)    Unknown reaction  . Paroxetine Hcl Other (See Comments)    Headaches (a long time ago- pt doesn't really remember)  . Statins Other (See Comments)    No specific reaction given-patient states that she was advised not to take by physician  . Sulfa Antibiotics Diarrhea and Other (See Comments)    Colitis   . Toprol Xl [Metoprolol Succinate] Other (See Comments)    Dizziness--pt doesn't really remember   . Vancomycin  Itching and Other (See Comments)    Pt reports that they gave it too fast- she started having itching in the scalp  . Penicillins Rash    DID THE REACTION INVOLVE: Swelling of the face/tongue/throat, SOB, or low BP? No Sudden or severe rash/hives, skin peeling, or the inside of the mouth or nose? Yes Did it require medical treatment? Unknown When did it last happen?Over 10 years If all above answers are "NO", may proceed with cephalosporin use.  Family History  Problem Relation Age of Onset  . Heart failure Mother   . Leukemia Mother   . Heart failure Father      Social History Ms. Bilger reports that she quit smoking about 31 years ago. Her smoking use included cigarettes. She has a 20.00 pack-year smoking history. She has never used smokeless tobacco. Ms. Folts reports no history of alcohol use.   Review of Systems CONSTITUTIONAL: No weight loss, fever, chills, weakness or fatigue.  HEENT: Eyes: No visual loss, blurred vision, double vision or yellow sclerae.No hearing loss, sneezing, congestion, runny nose or sore throat.  SKIN: No rash or itching.  CARDIOVASCULAR: per hpi RESPIRATORY: No shortness of breath, cough or sputum.  GASTROINTESTINAL: No anorexia, nausea, vomiting or diarrhea. No abdominal pain or blood.  GENITOURINARY: No burning on urination, no polyuria NEUROLOGICAL: No headache, dizziness, syncope, paralysis, ataxia, numbness or tingling in the extremities. No change in bowel or bladder control.  MUSCULOSKELETAL: No muscle, back pain, joint pain or stiffness.  LYMPHATICS: No enlarged nodes. No history of splenectomy.  PSYCHIATRIC: No history of depression or anxiety.  ENDOCRINOLOGIC: No reports of sweating, cold or heat intolerance. No polyuria or polydipsia.  Marland Kitchen   Physical Examination Vitals:   10/24/18 1354  BP: 108/73  Pulse: (!) 110  SpO2: 99%   Vitals:   10/24/18 1354  Weight: 146 lb 3.2 oz (66.3 kg)  Height: 5\' 5"  (1.651 m)     Gen: resting comfortably, no acute distress HEENT: no scleral icterus, pupils equal round and reactive, no palptable cervical adenopathy,  CV: irreg, rate 100, no m/r/g, no jvd Resp: Clear to auscultation bilaterally GI: abdomen is soft, non-tender, non-distended, normal bowel sounds, no hepatosplenomegaly MSK: extremities are warm, no edema.  Skin: warm, no rash Neuro:  no focal deficits Psych: appropriate affect   Diagnostic Studies  05/2018 echo Study Conclusions  - Left ventricle: The cavity size was normal. Wall thickness was   increased in a pattern of mild LVH. Systolic function was normal.   The estimated ejection fraction was in the range of 55% to 60%.   Wall motion was normal; there were no regional wall motion   abnormalities. - Aortic valve: There was mild regurgitation. Valve area (VTI):   3.21 cm^2. Valve area (Vmax): 2.94 cm^2. Valve area (Vmean): 3   cm^2. - Mitral valve: There was mild to moderate regurgitation. - Left atrium: The atrium was moderately dilated. - Right atrium: The atrium was mildly dilated. - Atrial septum: No defect or patent foramen ovale was identified. - Pulmonary arteries: Systolic pressure was moderately increased.   PA peak pressure: 44 mm Hg (S).   08/2016 holter  Profound bradycardia down to 30' during awake hours.  Several 2 second pauses.  Very frequent PVCs > 8000 in 48 hours.  NsVTs - several with the longest of 14 beats.   The patient is asymptomatic, we will discontinue Bystolic.  Ischemia workup. Consider EP consult.   05/2018 carotid US IMPRESSION: 1. Mild right carotid bifurcation plaque resulting in less than 50% diameter stenosis. 2. Nonvisualization of left vertebral arterial waveform.  Assessment and Plan  1. Afib/ Tachy-brady syndrome - EKG today shows afib with rate 130 - she has had prior cardiac monitors and also during admissions note heart rates in 30s to 40s. She has been asked on a few  different occasions by prior providers to stop her bisoprolol but has continued taking. Remains against stopping at this time despite our discussions about  her documented low heart rates - she essentially has tachy brady syndrome, will refer to EP to help consider treatment options - she feels eliquis 5mg  bid causes colitis, appears at some point it was lowered to 2.5mg  bid. . Not a common side effect of this medicine, we discussed in detail that based on her age, weight, and kidney function the recommended dose is 5mg  bid. She has had a prior stroke though related to her afib.  She is not willing to go back to the higher dose at this time.   2. PVCs/NSVT - she refused prior stress test - normal LVEF by echo 05/2018  3. Mitral regurgitation - mild to moderate, continue to monitor.    F/u 2 months  Arnoldo Lenis, M.D.

## 2018-10-25 NOTE — Telephone Encounter (Signed)
Agree 

## 2018-10-26 DIAGNOSIS — L728 Other follicular cysts of the skin and subcutaneous tissue: Secondary | ICD-10-CM | POA: Diagnosis not present

## 2018-10-26 DIAGNOSIS — L821 Other seborrheic keratosis: Secondary | ICD-10-CM | POA: Diagnosis not present

## 2018-11-03 ENCOUNTER — Encounter: Payer: Self-pay | Admitting: Internal Medicine

## 2018-11-03 ENCOUNTER — Ambulatory Visit: Payer: Medicare Other | Admitting: Internal Medicine

## 2018-11-03 ENCOUNTER — Ambulatory Visit: Payer: Self-pay | Admitting: Physician Assistant

## 2018-11-03 VITALS — BP 100/62 | HR 88 | Ht 65.0 in | Wt 148.0 lb

## 2018-11-03 DIAGNOSIS — I495 Sick sinus syndrome: Secondary | ICD-10-CM | POA: Diagnosis not present

## 2018-11-03 DIAGNOSIS — I480 Paroxysmal atrial fibrillation: Secondary | ICD-10-CM | POA: Diagnosis not present

## 2018-11-03 MED ORDER — APIXABAN 5 MG PO TABS: 5.0000 mg | ORAL_TABLET | Freq: Two times a day (BID) | ORAL | 6 refills | Status: DC

## 2018-11-03 NOTE — Progress Notes (Signed)
Electrophysiology Office Note   Date:  11/03/2018   ID:  MEDRITH Hardin, DOB 1933/08/26, MRN [id number]  PCP:  Carol Squibb, MD  Cardiologist:  Dr Harl Bowie Primary Electrophysiologist: previously Dr Lovena Le   CC: bradycardia   History of Present Illness: Carol Hardin is a 85 y.o. female who presents today for electrophysiology evaluation.   She presents for EP consultation regarding bradycardia.  She has seen Dr Lovena Le previously.  She opted for a conservative management of sinus pauses (2 seconds at that time).  She also had PACs/PVCs for which she was minimally symptomatic.  She has developed progressive afib.  She has moderate MR with moderate LA enlargment.  She previously had reduced EF which appears to have normalized. She has a h/o prior stroke and has been placed on eliquis.   On recent follow-up with Dr Harl Bowie, she was noted to have afib with elevated V rates (HR 130s). She has chronic issues with SOB and fatigue.  She has numerous dissatisfactions with healthcare in general and has not particularly been pleased with care from multiple providers. Today, she denies symptoms of palpitations, chest pain, , orthopnea, PND, lower extremity edema, claudication, dizziness, presyncope, syncope, bleeding, or neurologic sequela. The patient is tolerating medications without difficulties and is otherwise without complaint today.    Past Medical History:  Diagnosis Date  . Anxiety   . Arthritis    "fingers, right toe" (08/09/2016)  . Basal cell carcinoma of lower leg, right   . Bradycardia    a. Holter 08/30/16 showed profound bradycardia down to 30 during awake hours, several 2 second pauses, very frequent PVCs >8000 in 48 hours, and NSVT (longest of 14 beats).  . Cardiomyopathy ([place])    a. EF 40% by echo 08/2016.  . Daily headache    "I usually wake up w/a headache; sometimes it's a migraine" (08/09/2016)  . Depression   . Diverticulosis    Hx. of  . Frequent PVCs   . GERD  (gastroesophageal reflux disease)   . Hypercholesterolemia   . Migraine   . Mitral regurgitation   . MVP (mitral valve prolapse)   . NSVT (nonsustained ventricular tachycardia) ([place])    a. first noted event monitor 08/2016.  . Osteoporosis   . PAF (paroxysmal atrial fibrillation) ([place])    a. in afib at time of echo 08/2016.  Marland Kitchen Scoliosis    mild  . Squamous cell carcinoma of neck    Past Surgical History:  Procedure Laterality Date  . BASAL CELL CARCINOMA EXCISION Right    RLE  . BLEPHAROPLASTY    . BREAST BIOPSY Left   . BREAST CYST ASPIRATION Left   . BREAST CYST EXCISION Left   . BUNIONECTOMY Bilateral   . DILATION AND CURETTAGE OF UTERUS    . EXCISIONAL HEMORRHOIDECTOMY    . INTRAMEDULLARY (IM) NAIL INTERTROCHANTERIC Left 12/31/2017   Procedure: INTRAMEDULLARY (IM) NAIL INTERTROCHANTRIC;  Surgeon: Nicholes Stairs, MD;  Location: [facility];  Service: Orthopedics;  Laterality: Left;  . KNEE ARTHROSCOPY Right 04/12/2007  . KNEE ARTHROSCOPY Left   . SQUAMOUS CELL CARCINOMA EXCISION     "neck"  . TONSILLECTOMY       Current Outpatient Medications  Medication Sig Dispense Refill  . ammonium lactate (LAC-HYDRIN) 12 % lotion Apply 1 application topically 2 (two) times daily as needed for dry skin.    Marland Kitchen apixaban (ELIQUIS) 2.5 MG TABS tablet Take 2.5 mg by mouth 2 (two) times daily.    Marland Kitchen  bisoprolol (ZEBETA) 5 MG tablet Take 2.5 mg by mouth at bedtime.    . Emollient ([place] [employer] HAND & BODY) LOTN Apply 1 application topically daily as needed.    . Glycerin-Hypromellose-PEG 400 (HM DRY EYE RELIEF) 0.2-0.2-1 % SOLN Apply 1-2 drops to eye daily as needed (for dry eye relief).    . LORazepam (ATIVAN) 1 MG tablet Take 1 tablet (1 mg total) at bedtime by mouth. 1  qhs  Half  qam 45 tablet 3  . polyethylene glycol (MIRALAX / GLYCOLAX) packet Take 17 g by mouth daily as needed for mild constipation.      Current Facility-Administered Medications  Medication Dose Route Frequency  Provider Last Rate Last Dose  . sertraline (ZOLOFT) 20 MG/ML concentrated solution 25 mg  25 mg Oral Daily Plovsky, Berneta Sages, MD        Allergies:   Omeprazole; Flagyl [metronidazole]; Aripiprazole; Hylan g-f 20; Paroxetine hcl; Statins; Sulfa antibiotics; Toprol xl [metoprolol succinate]; Vancomycin; and Penicillins   Social History:  The patient  reports that she quit smoking about 31 years ago. Her smoking use included cigarettes. She has a 20.00 pack-year smoking history. She has never used smokeless tobacco. She reports that she does not drink alcohol or use drugs.   Family History:  The patient's family history includes Heart failure in her father and mother; Leukemia in her mother.    ROS:  Please see the history of present illness.   All other systems are personally reviewed and negative.    PHYSICAL EXAM: VS:  BP 100/62   Pulse 88   Ht 5\' 5"  (1.651 m)   Wt 148 lb (67.1 kg)   SpO2 97%   BMI 24.63 kg/m  , BMI Body mass index is 24.63 kg/m. GEN: Well nourished, well developed, in no acute distress  HEENT: normal  Neck: no JVD, carotid bruits, or masses Cardiac: iRRR; no murmurs, rubs, or gallops,no edema  Respiratory:  clear to auscultation bilaterally, normal work of breathing GI: soft, nontender, nondistended, + BS MS: no deformity or atrophy  Skin: warm and dry, device pocket is well healed Neuro:  Strength and sensation are intact Psych: euthymic mood, full affect  EKG:  EKG is ordered today. The ekg ordered today is personally reviewed and shows afib, V rate 88 bpm, QRS 110 msec, QTc 442 msec, LAHB  Device interrogation is personally reviewed today in detail.  See PaceArt for details.   Recent Labs: 05/19/2018: ALT 17 10/18/2018: BUN 15; Creatinine, Ser 0.97; Hemoglobin 13.8; Platelets 206; Potassium 4.1; Sodium 140  personally reviewed   Lipid Panel     Component Value Date/Time   CHOL 153 05/20/2018 0606   TRIG 76 05/20/2018 0606   HDL 38 (L) 05/20/2018 0606     CHOLHDL 4.0 05/20/2018 0606   VLDL 15 05/20/2018 0606   LDLCALC 100 (H) 05/20/2018 0606   personally reviewed   Wt Readings from Last 3 Encounters:  11/03/18 148 lb (67.1 kg)  10/24/18 146 lb 3.2 oz (66.3 kg)  10/18/18 152 lb (68.9 kg)      Other studies Reviewed: Additional studies/ records that were personally reviewed today include: Dr Lovena Le and Dr Percell Locus notes are reviewed, prior event monitor is reviewed Review of the above records today demonstrates: as above   ASSESSMENT AND PLAN:  1.  Persistent afib I believe that her symptoms are due to afib.  She would likely do better in sinus rhythm.  Therapeutic options are limited by bradycardia.  She  is not a candidate for ablation. I would advise tikosyn. She will need to comply with therapeutic eliquis (5mg  BID) rather than the lower dose that she has been taking before we can proceed. I have therefore advised eliquis 5mg  BID.  After 3 weeks of compliance, she can be seen in the AF clinic for tikosyn load.  2. Sinus bradycardia Limits our medical options for afib. She is mostly asymptomatic with her sinus bradycardia She is clear that she would like to avoid pacing. Hopefully, we can maintain sinus rhythm with tikosyn and then avoid the need for pacing as RVR is illuminated.   Follow-up with Dr Harl Bowie as scheduled I have encouraged her to follow-up with Dr Lovena Le in [place] for EP care.  I will see as needed.  Once she decides to comply with eliquis 5mg  BID, I have encouraged her to contact the AF clinic to arrange tikosyn loading.  Current medicines are reviewed at length with the patient today.   The patient does not have concerns regarding her medicines.  The following changes were made today:  none    Signed, Thompson Grayer, MD  11/03/2018 12:57 PM     [address] [place] [address] [phone] (office) [phone] (fax)

## 2018-11-03 NOTE — Patient Instructions (Signed)
Medication Instructions:  Increase Eliquis to 5 mg twice daily You need to be on Eliquis twice daily for at least 3 weeks and then you can be seen in AFib Clinic for Tikosyn Admission   If you need a refill on your cardiac medications before your next appointment, please call your pharmacy.   Lab work: none If you have labs (blood work) drawn today and your tests are completely normal, you will receive your results only by: Marland Kitchen MyChart Message (if you have MyChart) OR . A paper copy in the mail If you have any lab test that is abnormal or we need to change your treatment, we will call you to review the results.  Testing/Procedures: none  Follow-Up: afib clinic after taking Eliquis 5 mg for 3 weeks. Please schedule this appointment to be admitted to the hospital to begin Tikosyn therapy. Any Other Special Instructions Will Be Listed Below (If Applicable).

## 2018-11-18 ENCOUNTER — Emergency Department (HOSPITAL_COMMUNITY): Payer: Medicare Other

## 2018-11-18 ENCOUNTER — Inpatient Hospital Stay (HOSPITAL_COMMUNITY)
Admission: EM | Admit: 2018-11-18 | Discharge: 2018-11-22 | DRG: 193 | Disposition: A | Discharge status: Home / Self Care | Payer: Medicare Other | Attending: Internal Medicine | Admitting: Internal Medicine

## 2018-11-18 ENCOUNTER — Encounter (HOSPITAL_COMMUNITY): Payer: Self-pay

## 2018-11-18 ENCOUNTER — Other Ambulatory Visit: Payer: Self-pay

## 2018-11-18 DIAGNOSIS — Z8673 Personal history of transient ischemic attack (TIA), and cerebral infarction without residual deficits: Secondary | ICD-10-CM

## 2018-11-18 DIAGNOSIS — E46 Unspecified protein-calorie malnutrition: Secondary | ICD-10-CM

## 2018-11-18 DIAGNOSIS — I509 Heart failure, unspecified: Secondary | ICD-10-CM

## 2018-11-18 DIAGNOSIS — Z8659 Personal history of other mental and behavioral disorders: Secondary | ICD-10-CM

## 2018-11-18 DIAGNOSIS — I6340 Cerebral infarction due to embolism of unspecified cerebral artery: Secondary | ICD-10-CM | POA: Diagnosis not present

## 2018-11-18 DIAGNOSIS — R0602 Shortness of breath: Secondary | ICD-10-CM

## 2018-11-18 DIAGNOSIS — R0609 Other forms of dyspnea: Secondary | ICD-10-CM

## 2018-11-18 DIAGNOSIS — I495 Sick sinus syndrome: Secondary | ICD-10-CM | POA: Diagnosis present

## 2018-11-18 DIAGNOSIS — M19042 Primary osteoarthritis, left hand: Secondary | ICD-10-CM | POA: Diagnosis not present

## 2018-11-18 DIAGNOSIS — M810 Age-related osteoporosis without current pathological fracture: Secondary | ICD-10-CM | POA: Diagnosis present

## 2018-11-18 DIAGNOSIS — I5043 Acute on chronic combined systolic (congestive) and diastolic (congestive) heart failure: Secondary | ICD-10-CM | POA: Diagnosis present

## 2018-11-18 DIAGNOSIS — Z888 Allergy status to other drugs, medicaments and biological substances status: Secondary | ICD-10-CM

## 2018-11-18 DIAGNOSIS — I5033 Acute on chronic diastolic (congestive) heart failure: Secondary | ICD-10-CM | POA: Diagnosis present

## 2018-11-18 DIAGNOSIS — I361 Nonrheumatic tricuspid (valve) insufficiency: Secondary | ICD-10-CM | POA: Diagnosis not present

## 2018-11-18 DIAGNOSIS — E440 Moderate protein-calorie malnutrition: Secondary | ICD-10-CM | POA: Diagnosis not present

## 2018-11-18 DIAGNOSIS — Z85828 Personal history of other malignant neoplasm of skin: Secondary | ICD-10-CM | POA: Diagnosis not present

## 2018-11-18 DIAGNOSIS — Z881 Allergy status to other antibiotic agents status: Secondary | ICD-10-CM

## 2018-11-18 DIAGNOSIS — J181 Lobar pneumonia, unspecified organism: Secondary | ICD-10-CM

## 2018-11-18 DIAGNOSIS — J90 Pleural effusion, not elsewhere classified: Secondary | ICD-10-CM

## 2018-11-18 DIAGNOSIS — I429 Cardiomyopathy, unspecified: Secondary | ICD-10-CM | POA: Diagnosis not present

## 2018-11-18 DIAGNOSIS — H04129 Dry eye syndrome of unspecified lacrimal gland: Secondary | ICD-10-CM | POA: Diagnosis present

## 2018-11-18 DIAGNOSIS — K219 Gastro-esophageal reflux disease without esophagitis: Secondary | ICD-10-CM | POA: Diagnosis not present

## 2018-11-18 DIAGNOSIS — M19041 Primary osteoarthritis, right hand: Secondary | ICD-10-CM | POA: Diagnosis not present

## 2018-11-18 DIAGNOSIS — Z8589 Personal history of malignant neoplasm of other organs and systems: Secondary | ICD-10-CM

## 2018-11-18 DIAGNOSIS — I472 Ventricular tachycardia: Secondary | ICD-10-CM | POA: Diagnosis present

## 2018-11-18 DIAGNOSIS — M19071 Primary osteoarthritis, right ankle and foot: Secondary | ICD-10-CM | POA: Diagnosis not present

## 2018-11-18 DIAGNOSIS — I639 Cerebral infarction, unspecified: Secondary | ICD-10-CM | POA: Diagnosis present

## 2018-11-18 DIAGNOSIS — I341 Nonrheumatic mitral (valve) prolapse: Secondary | ICD-10-CM | POA: Diagnosis not present

## 2018-11-18 DIAGNOSIS — I340 Nonrheumatic mitral (valve) insufficiency: Secondary | ICD-10-CM | POA: Diagnosis present

## 2018-11-18 DIAGNOSIS — I480 Paroxysmal atrial fibrillation: Secondary | ICD-10-CM | POA: Diagnosis not present

## 2018-11-18 DIAGNOSIS — Z79899 Other long term (current) drug therapy: Secondary | ICD-10-CM

## 2018-11-18 DIAGNOSIS — E782 Mixed hyperlipidemia: Secondary | ICD-10-CM | POA: Diagnosis not present

## 2018-11-18 DIAGNOSIS — N183 Chronic kidney disease, stage 3 (moderate): Secondary | ICD-10-CM | POA: Diagnosis not present

## 2018-11-18 DIAGNOSIS — Z6823 Body mass index (BMI) 23.0-23.9, adult: Secondary | ICD-10-CM

## 2018-11-18 DIAGNOSIS — Z880 Allergy status to penicillin: Secondary | ICD-10-CM

## 2018-11-18 DIAGNOSIS — F411 Generalized anxiety disorder: Secondary | ICD-10-CM | POA: Diagnosis present

## 2018-11-18 DIAGNOSIS — Z9119 Patient's noncompliance with other medical treatment and regimen: Secondary | ICD-10-CM

## 2018-11-18 DIAGNOSIS — Z87891 Personal history of nicotine dependence: Secondary | ICD-10-CM

## 2018-11-18 DIAGNOSIS — J189 Pneumonia, unspecified organism: Secondary | ICD-10-CM | POA: Diagnosis present

## 2018-11-18 DIAGNOSIS — I4891 Unspecified atrial fibrillation: Secondary | ICD-10-CM | POA: Diagnosis not present

## 2018-11-18 DIAGNOSIS — Z882 Allergy status to sulfonamides status: Secondary | ICD-10-CM

## 2018-11-18 DIAGNOSIS — R0600 Dyspnea, unspecified: Secondary | ICD-10-CM

## 2018-11-18 DIAGNOSIS — M419 Scoliosis, unspecified: Secondary | ICD-10-CM | POA: Diagnosis present

## 2018-11-18 DIAGNOSIS — Z7901 Long term (current) use of anticoagulants: Secondary | ICD-10-CM

## 2018-11-18 DIAGNOSIS — F329 Major depressive disorder, single episode, unspecified: Secondary | ICD-10-CM | POA: Diagnosis present

## 2018-11-18 DIAGNOSIS — I5021 Acute systolic (congestive) heart failure: Secondary | ICD-10-CM | POA: Diagnosis not present

## 2018-11-18 DIAGNOSIS — Z8249 Family history of ischemic heart disease and other diseases of the circulatory system: Secondary | ICD-10-CM

## 2018-11-18 LAB — INFLUENZA PANEL BY PCR (TYPE A & B)
INFLBPCR: NEGATIVE
Influenza A By PCR: NEGATIVE

## 2018-11-18 LAB — DIFFERENTIAL
Basophils Absolute: 0.1 10*3/uL (ref 0.0–0.1)
Basophils Relative: 1 %
Eosinophils Absolute: 0.2 10*3/uL (ref 0.0–0.5)
Eosinophils Relative: 2 %
Lymphocytes Relative: 25 %
Lymphs Abs: 1.8 10*3/uL (ref 0.7–4.0)
MONOS PCT: 11 %
Monocytes Absolute: 0.8 10*3/uL (ref 0.1–1.0)
Neutro Abs: 4.5 10*3/uL (ref 1.7–7.7)
Neutrophils Relative %: 62 %

## 2018-11-18 LAB — TROPONIN I
Troponin I: <0.03 ng/mL (ref <0.03)
Troponin I: <0.03 ng/mL (ref <0.03)

## 2018-11-18 LAB — BASIC METABOLIC PANEL
ANION GAP: 9 (ref 5–15)
BUN: 16 mg/dL (ref 8–23)
CO2: 19 mmol/L — ABNORMAL LOW (ref 22–32)
Calcium: 8.9 mg/dL (ref 8.9–10.3)
Chloride: 112 mmol/L — ABNORMAL HIGH (ref 98–111)
Creatinine, Ser: 1.07 mg/dL — ABNORMAL HIGH (ref 0.44–1.00)
GFR calc Af Amer: 56 mL/min — ABNORMAL LOW (ref >60)
GFR calc non Af Amer: 48 mL/min — ABNORMAL LOW (ref >60)
GLUCOSE: 119 mg/dL — AB (ref 70–99)
Potassium: 3.9 mmol/L (ref 3.5–5.1)
Sodium: 140 mmol/L (ref 135–145)

## 2018-11-18 LAB — TSH: TSH: 3.484 u[IU]/mL (ref 0.350–4.500)

## 2018-11-18 LAB — CBC
HCT: 40.5 % (ref 36.0–46.0)
Hemoglobin: 12.8 g/dL (ref 12.0–15.0)
MCH: 30.9 pg (ref 26.0–34.0)
MCHC: 31.6 g/dL (ref 30.0–36.0)
MCV: 97.8 fL (ref 80.0–100.0)
Platelets: 245 10*3/uL (ref 150–400)
RBC: 4.14 MIL/uL (ref 3.87–5.11)
RDW: 15.3 % (ref 11.5–15.5)
WBC: 7.4 10*3/uL (ref 4.0–10.5)
nRBC: 0 % (ref 0.0–0.2)

## 2018-11-18 LAB — HEPATIC FUNCTION PANEL
ALT: 44 U/L (ref 0–44)
AST: 38 U/L (ref 15–41)
Albumin: 3.6 g/dL (ref 3.5–5.0)
Alkaline Phosphatase: 58 U/L (ref 38–126)
Bilirubin, Direct: 0.2 mg/dL (ref 0.0–0.2)
Indirect Bilirubin: 0.9 mg/dL (ref 0.3–0.9)
Total Bilirubin: 1.1 mg/dL (ref 0.3–1.2)
Total Protein: 6.5 g/dL (ref 6.5–8.1)

## 2018-11-18 LAB — BRAIN NATRIURETIC PEPTIDE: B Natriuretic Peptide: 942 pg/mL — ABNORMAL HIGH (ref 0.0–100.0)

## 2018-11-18 MED ORDER — SODIUM CHLORIDE 0.9 % IV SOLN: 250.0000 mL | INTRAVENOUS | Status: DC | PRN

## 2018-11-18 MED ORDER — POLYVINYL ALCOHOL 1.4 % OP SOLN: 1.0000 [drp] | Freq: Every day | OPHTHALMIC | Status: DC | PRN

## 2018-11-18 MED ORDER — SODIUM CHLORIDE 0.9% FLUSH: 3.0000 mL | INTRAVENOUS | Status: DC | PRN

## 2018-11-18 MED ORDER — DILTIAZEM HCL 100 MG IV SOLR: 5.0000 mg/h | INTRAVENOUS | Status: DC

## 2018-11-18 MED ORDER — BISOPROLOL FUMARATE 5 MG PO TABS
2.5000 mg | ORAL_TABLET | Freq: Every day | ORAL | Status: DC
  Administered 2018-11-18 – 2018-11-21 (×4): 2.5 mg via ORAL
  Filled 2018-11-18 (×4): qty 1

## 2018-11-18 MED ORDER — ACETAMINOPHEN 325 MG PO TABS
650.0000 mg | ORAL_TABLET | ORAL | Status: DC | PRN
  Administered 2018-11-19: 650 mg via ORAL
  Filled 2018-11-18: qty 2

## 2018-11-18 MED ORDER — APIXABAN 5 MG PO TABS
5.0000 mg | ORAL_TABLET | Freq: Two times a day (BID) | ORAL | Status: DC
  Administered 2018-11-18 – 2018-11-22 (×8): 5 mg via ORAL
  Filled 2018-11-18 (×8): qty 1

## 2018-11-18 MED ORDER — LEVOFLOXACIN IN D5W 750 MG/150ML IV SOLN
750.0000 mg | INTRAVENOUS | Status: DC
  Administered 2018-11-18: 750 mg via INTRAVENOUS
  Filled 2018-11-18: qty 150

## 2018-11-18 MED ORDER — LEVALBUTEROL HCL 0.63 MG/3ML IN NEBU: 0.6300 mg | INHALATION_SOLUTION | Freq: Three times a day (TID) | RESPIRATORY_TRACT | Status: DC | PRN

## 2018-11-18 MED ORDER — DILTIAZEM LOAD VIA INFUSION
10.0000 mg | Freq: Once | INTRAVENOUS | Status: AC
  Administered 2018-11-18: 10 mg via INTRAVENOUS
  Filled 2018-11-18: qty 10

## 2018-11-18 MED ORDER — SERTRALINE HCL 50 MG PO TABS
25.0000 mg | ORAL_TABLET | Freq: Every day | ORAL | Status: DC
  Filled 2018-11-18: qty 1

## 2018-11-18 MED ORDER — SODIUM CHLORIDE 0.9% FLUSH
3.0000 mL | Freq: Two times a day (BID) | INTRAVENOUS | Status: DC
  Administered 2018-11-19 – 2018-11-22 (×6): 3 mL via INTRAVENOUS

## 2018-11-18 MED ORDER — DILTIAZEM HCL 100 MG IV SOLR
5.0000 mg/h | INTRAVENOUS | Status: DC
  Administered 2018-11-18 – 2018-11-19 (×2): 5 mg/h via INTRAVENOUS
  Filled 2018-11-18 (×2): qty 100

## 2018-11-18 MED ORDER — ENSURE ENLIVE PO LIQD
237.0000 mL | Freq: Two times a day (BID) | ORAL | Status: DC
  Administered 2018-11-19: 237 mL via ORAL

## 2018-11-18 MED ORDER — SERTRALINE HCL 20 MG/ML PO CONC: 25.0000 mg | Freq: Every day | ORAL | Status: DC

## 2018-11-18 MED ORDER — ONDANSETRON HCL 4 MG/2ML IJ SOLN: 4.0000 mg | Freq: Four times a day (QID) | INTRAMUSCULAR | Status: DC | PRN

## 2018-11-18 MED ORDER — IPRATROPIUM BROMIDE 0.02 % IN SOLN
RESPIRATORY_TRACT | Status: AC
  Filled 2018-11-18: qty 2.5

## 2018-11-18 MED ORDER — FUROSEMIDE 10 MG/ML IJ SOLN
20.0000 mg | Freq: Once | INTRAMUSCULAR | Status: AC
  Administered 2018-11-18: 20 mg via INTRAVENOUS
  Filled 2018-11-18: qty 2

## 2018-11-18 MED ORDER — POLYETHYLENE GLYCOL 3350 17 G PO PACK: 17.0000 g | PACK | Freq: Every day | ORAL | Status: DC | PRN

## 2018-11-18 MED ORDER — FUROSEMIDE 10 MG/ML IJ SOLN
20.0000 mg | Freq: Every day | INTRAMUSCULAR | Status: DC
  Administered 2018-11-19 – 2018-11-20 (×2): 20 mg via INTRAVENOUS
  Filled 2018-11-18 (×3): qty 2

## 2018-11-18 MED ORDER — FAMOTIDINE 20 MG PO TABS
20.0000 mg | ORAL_TABLET | Freq: Every day | ORAL | Status: DC
  Administered 2018-11-19 – 2018-11-22 (×4): 20 mg via ORAL
  Filled 2018-11-18 (×4): qty 1

## 2018-11-18 MED ORDER — LORAZEPAM 0.5 MG PO TABS
0.5000 mg | ORAL_TABLET | Freq: Two times a day (BID) | ORAL | Status: DC | PRN
  Administered 2018-11-18: 0.5 mg via ORAL
  Filled 2018-11-18: qty 1

## 2018-11-18 MED ORDER — IPRATROPIUM BROMIDE 0.02 % IN SOLN
0.5000 mg | Freq: Four times a day (QID) | RESPIRATORY_TRACT | Status: DC
  Administered 2018-11-18 – 2018-11-21 (×12): 0.5 mg via RESPIRATORY_TRACT
  Filled 2018-11-18 (×11): qty 2.5

## 2018-11-18 NOTE — ED Triage Notes (Signed)
Pt reports she has had chest tightness and SOB for one week which has gotten worse. Pt has been coughing as well

## 2018-11-18 NOTE — ED Provider Notes (Signed)
[facility] EMERGENCY DEPARTMENT Provider Note   CSN: [id number] Arrival date & time: 11/18/18  1220     History   Chief Complaint Chief Complaint  Patient presents with  . Chest Pain  . Shortness of Breath    HPI Carol Hardin is a 85 y.o. female.  HPI  Pt was seen at 1240. Per pt and her spouse, c/o gradual onset and worsening of persistent SOB for the past 1 week, worse over the past few days. Has been associated with cough, increasing pedal edema, and generalized chest "tightness." States the chest tightness has been present for the past 1 week. SOB worsens on exertion and laying flat, and she is now SOB even at rest/sitting. Endorses compliance with eliquis. Denies palpitations, no fevers, no rash, no abd pain, no N/V/D, no back pain, no injury.    Past Medical History:  Diagnosis Date  . Anxiety   . Arthritis    "fingers, right toe" (08/09/2016)  . Basal cell carcinoma of lower leg, right   . Bradycardia    a. Holter 08/30/16 showed profound bradycardia down to 30 during awake hours, several 2 second pauses, very frequent PVCs >8000 in 48 hours, and NSVT (longest of 14 beats).  . Cardiomyopathy ([place])    a. EF 40% by echo 08/2016.  . Daily headache    "I usually wake up w/a headache; sometimes it's a migraine" (08/09/2016)  . Depression   . Diverticulosis    Hx. of  . Frequent PVCs   . GERD (gastroesophageal reflux disease)   . Hypercholesterolemia   . Migraine   . Mitral regurgitation   . MVP (mitral valve prolapse)   . NSVT (nonsustained ventricular tachycardia) ([place])    a. first noted event monitor 08/2016.  . Osteoporosis   . PAF (paroxysmal atrial fibrillation) ([place])    a. in afib at time of echo 08/2016.  Marland Kitchen Scoliosis    mild  . Squamous cell carcinoma of neck     Patient Active Problem List   Diagnosis Date Noted  . Stroke (cerebrum) ([place]) 05/19/2018  . Hip fracture ([place]) 12/31/2017  . PAF (paroxysmal atrial fibrillation) ([place])   . NSVT  (nonsustained ventricular tachycardia) ([place])   . Mitral regurgitation   . Frequent PVCs   . Bradycardia   . Essential hypertension   . Hypokalemia   . Diverticulitis 08/09/2016  . Diverticulosis of colon 04/22/2016  . Diverticulosis of large intestine without perforation or abscess without bleeding 04/22/2016  . Generalized anxiety disorder 04/22/2016  . Irritable bowel syndrome without diarrhea 04/22/2016  . Mitral valve prolapse 04/22/2016  . Mixed hyperlipidemia 04/22/2016  . Osteopenia 04/22/2016  . Low blood pressure 09/24/2014  . Hypercholesterolemia   . History of depression   . Scoliosis   . Osteoporosis     Past Surgical History:  Procedure Laterality Date  . BASAL CELL CARCINOMA EXCISION Right    RLE  . BLEPHAROPLASTY    . BREAST BIOPSY Left   . BREAST CYST ASPIRATION Left   . BREAST CYST EXCISION Left   . BUNIONECTOMY Bilateral   . DILATION AND CURETTAGE OF UTERUS    . EXCISIONAL HEMORRHOIDECTOMY    . INTRAMEDULLARY (IM) NAIL INTERTROCHANTERIC Left 12/31/2017   Procedure: INTRAMEDULLARY (IM) NAIL INTERTROCHANTRIC;  Surgeon: Nicholes Stairs, MD;  Location: [place];  Service: Orthopedics;  Laterality: Left;  . KNEE ARTHROSCOPY Right 04/12/2007  . KNEE ARTHROSCOPY Left   . SQUAMOUS CELL CARCINOMA EXCISION     "neck"  .  TONSILLECTOMY       OB History    Gravida  6   Para  4   Term  4   Preterm      AB  2   Living        SAB  2   TAB      Ectopic      Multiple      Live Births               Home Medications    Prior to Admission medications   Medication Sig Start Date End Date Taking? Authorizing Provider  ammonium lactate (LAC-HYDRIN) 12 % lotion Apply 1 application topically 2 (two) times daily as needed for dry skin.    [provider]  apixaban (ELIQUIS) 5 MG TABS tablet Take 1 tablet (5 mg total) by mouth 2 (two) times daily. 11/03/18   Allred, Jeneen Rinks, MD  bisoprolol (ZEBETA) 5 MG tablet Take 2.5 mg by mouth at bedtime.     [provider]  Emollient ([place] [employer] HAND & BODY) LOTN Apply 1 application topically daily as needed.    [provider]  Glycerin-Hypromellose-PEG 400 (HM DRY EYE RELIEF) 0.2-0.2-1 % SOLN Apply 1-2 drops to eye daily as needed (for dry eye relief).    [provider]  LORazepam (ATIVAN) 1 MG tablet Take 1 tablet (1 mg total) at bedtime by mouth. 1  qhs  Half  qam 08/17/17   Plovsky, Berneta Sages, MD  polyethylene glycol (MIRALAX / GLYCOLAX) packet Take 17 g by mouth daily as needed for mild constipation.     [provider]    Family History Family History  Problem Relation Age of Onset  . Heart failure Mother   . Leukemia Mother   . Heart failure Father     Social History Social History   Tobacco Use  . Smoking status: Former Smoker    Packs/day: 1.00    Years: 20.00    Pack years: 20.00    Types: Cigarettes    Last attempt to quit: 1989    Years since quitting: 31.1  . Smokeless tobacco: Never Used  Substance Use Topics  . Alcohol use: No  . Drug use: No     Allergies   Omeprazole; Flagyl [metronidazole]; Aripiprazole; Hylan g-f 20; Paroxetine hcl; Statins; Sulfa antibiotics; Toprol xl [metoprolol succinate]; Vancomycin; and Penicillins   Review of Systems Review of Systems ROS: Statement: All systems negative except as marked or noted in the HPI; Constitutional: Negative for fever and chills. ; ; Eyes: Negative for eye pain, redness and discharge. ; ; ENMT: Negative for ear pain, hoarseness, nasal congestion, sinus pressure and sore throat. ; ; Cardiovascular: Negative for palpitations, diaphoresis, +CP, dyspnea and peripheral edema. ; ; Respiratory: +cough. Negative for wheezing and stridor. ; ; Gastrointestinal: Negative for nausea, vomiting, diarrhea, abdominal pain, blood in stool, hematemesis, jaundice and rectal bleeding. . ; ; Genitourinary: Negative for dysuria, flank pain and hematuria. ; ; Musculoskeletal: Negative for back pain  and neck pain. Negative for swelling and trauma.; ; Skin: Negative for pruritus, rash, abrasions, blisters, bruising and skin lesion.; ; Neuro: Negative for headache, lightheadedness and neck stiffness. Negative for weakness, altered level of consciousness, altered mental status, extremity weakness, paresthesias, involuntary movement, seizure and syncope.       Physical Exam Updated Vital Signs BP 107/85 (BP Location: Right Arm)   Pulse (!) 150   Temp (!) 97.2 F (36.2 C) (Oral)   Resp 20  Ht 5\' 5"  (1.651 m)   Wt 67 kg   SpO2 96%   BMI 24.58 kg/m   Physical Exam 1245: Physical examination:  Nursing notes reviewed; Vital signs and O2 SAT reviewed;  Constitutional: Well developed, Well nourished, Well hydrated, In no acute distress; Head:  Normocephalic, atraumatic; Eyes: EOMI, PERRL, No scleral icterus; ENMT: Mouth and pharynx normal, Mucous membranes moist; Neck: Supple, Full range of motion, No lymphadenopathy; Cardiovascular: Irregular tachycardic rate and rhythm, No gallop; Respiratory: Breath sounds coarse & equal bilaterally, No wheezes.  Speaking full sentences with ease, Normal respiratory effort/excursion; Chest: Nontender, Movement normal; Abdomen: Soft, Nontender, Nondistended, Normal bowel sounds; Genitourinary: No CVA tenderness; Extremities: Peripheral pulses normal, No tenderness, +1 pedal edema bilat. No calf edema or asymmetry.; Neuro: AA&Ox3, Major CN grossly intact.  Speech clear. No gross focal motor or sensory deficits in extremities.; Skin: Color normal, Warm, Dry.   ED Treatments / Results  Labs (all labs ordered are listed, but only abnormal results are displayed)   EKG EKG Interpretation  Date/Time:  Saturday November 18 2018 12:27:54 EST Ventricular Rate:  150 PR Interval:    QRS Duration: 96 QT Interval:  332 QTC Calculation: 524 R Axis:   6 Text Interpretation:  Atrial fibrillation with rapid ventricular response with premature ventricular or  aberrantly conducted complexes Septal infarct , age undetermined Inferior infarct , age undetermined When compared with ECG of 10/18/2018 Rate faster Confirmed by Francine Graven [phone]) on 11/18/2018 1:08:27 PM   Radiology   Procedures Procedures (including critical care time)  Medications Ordered in ED Medications  diltiazem (CARDIZEM) 1 mg/mL load via infusion 10 mg (has no administration in time range)    And  diltiazem (CARDIZEM) 100 mg in dextrose 5 % 100 mL (1 mg/mL) infusion (has no administration in time range)     Initial Impression / Assessment and Plan / ED Course  I have reviewed the triage vital signs and the nursing notes.  Pertinent labs & imaging results that were available during my care of the patient were reviewed by me and considered in my medical decision making (see chart for details).  MDM Reviewed: previous chart, nursing note and vitals Reviewed previous: labs and ECG Interpretation: labs, ECG and x-ray Total time providing critical care: 30-74 minutes. This excludes time spent performing separately reportable procedures and services. Consults: cardiology and admitting MD   CRITICAL CARE Performed by: Francine Graven Total critical care time: 35 minutes Critical care time was exclusive of separately billable procedures and treating other patients. Critical care was necessary to treat or prevent imminent or life-threatening deterioration. Critical care was time spent personally by me on the following activities: development of treatment plan with patient and/or surrogate as well as nursing, discussions with consultants, evaluation of patient's response to treatment, examination of patient, obtaining history from patient or surrogate, ordering and performing treatments and interventions, ordering and review of laboratory studies, ordering and review of radiographic studies, pulse oximetry and re-evaluation of patient's condition.  Results for orders placed  or performed during the hospital encounter of 78/93/81  Basic metabolic panel  Result Value Ref Range   Sodium 140 135 - 145 mmol/L   Potassium 3.9 3.5 - 5.1 mmol/L   Chloride 112 (H) 98 - 111 mmol/L   CO2 19 (L) 22 - 32 mmol/L   Glucose, Bld 119 (H) 70 - 99 mg/dL   BUN 16 8 - 23 mg/dL   Creatinine, Ser 1.07 (H) 0.44 - 1.00 mg/dL  Calcium 8.9 8.9 - 10.3 mg/dL   GFR calc non Af Amer 48 (L) >60 mL/min   GFR calc Af Amer 56 (L) >60 mL/min   Anion gap 9 5 - 15  CBC  Result Value Ref Range   WBC 7.4 4.0 - 10.5 K/uL   RBC 4.14 3.87 - 5.11 MIL/uL   Hemoglobin 12.8 12.0 - 15.0 g/dL   HCT 40.5 36.0 - 46.0 %   MCV 97.8 80.0 - 100.0 fL   MCH 30.9 26.0 - 34.0 pg   MCHC 31.6 30.0 - 36.0 g/dL   RDW 15.3 11.5 - 15.5 %   Platelets 245 150 - 400 K/uL   nRBC 0.0 0.0 - 0.2 %  Troponin I - ONCE - STAT  Result Value Ref Range   Troponin I <0.03 <0.03 ng/mL  Brain natriuretic peptide  Result Value Ref Range   B Natriuretic Peptide 942.0 (H) 0.0 - 100.0 pg/mL  Differential  Result Value Ref Range   Neutrophils Relative % 62 %   Neutro Abs 4.5 1.7 - 7.7 K/uL   Lymphocytes Relative 25 %   Lymphs Abs 1.8 0.7 - 4.0 K/uL   Monocytes Relative 11 %   Monocytes Absolute 0.8 0.1 - 1.0 K/uL   Eosinophils Relative 2 %   Eosinophils Absolute 0.2 0.0 - 0.5 K/uL   Basophils Relative 1 %   Basophils Absolute 0.1 0.0 - 0.1 K/uL   Dg Chest 2 View Result Date: 11/18/2018 CLINICAL DATA:  85 year old female with history of chest tightness and shortness of breath for 1 week, progressively worsening. EXAM: CHEST - 2 VIEW COMPARISON:  Chest x-ray 10/18/2018. FINDINGS: Ill-defined airspace consolidation in the medial aspect of the left lower lobe, concerning for developing bronchopneumonia. Small left pleural effusion. Right lung is clear. No evidence of pulmonary edema. Heart size is mildly enlarged. Upper mediastinal contours are within normal limits. Aortic atherosclerosis. IMPRESSION: 1. Findings are concerning  for left lower lobe pneumonia with small left parapneumonic pleural effusion. 2. Mild cardiomegaly. 3. Aortic atherosclerosis. Electronically Signed   By: Vinnie Langton M.D.   On: 11/18/2018 13:52     1245:  Monitor afib/RVR with rates to 150's during my exam. IV cardizem bolus and gtt ordered.   1415:  HR now 80-100's on IV cardizem gtt. IV lasix given for clinically appearing fluid overloaded and elevated BNP (no old to compare). IV abx started for CAP. Dx and testing d/w pt and family.  Questions answered.  Verb understanding, agreeable to admit. T/C returned from [facility]. Dyann Kief, case discussed, including:  HPI, pertinent PM/SHx, VS/PE, dx testing, ED course and treatment:  Agreeable to admit.      Final Clinical Impressions(s) / ED Diagnoses   Final diagnoses:  None    ED Discharge Orders    None       Francine Graven, DO 11/20/18 1506

## 2018-11-18 NOTE — Progress Notes (Signed)
Waiting for release of drugs by pharmacy.

## 2018-11-18 NOTE — H&P (Signed)
History and Physical    Carol Hardin EGB:[id number] DOB: 08-06-1933 DOA: 11/18/2018  Referring MD/NP/PA: Dr. Thurnell Garbe. PCP: Celene Squibb, MD  Patient coming from: Home  Chief Complaint: Shortness of breath, chest tightness and lower extremity swelling.  HPI: Carol Hardin is a 85 y.o. female with significant past medical history as mentioned below, who presented to the emergency department complaining of gradual onset worsening persistent shortness of breath for over a week now.  Patient reports chest tightness sensation associated with shortness of breath and also experiencing dry cough.  She has noticed increased swelling in her legs and also positive orthopnea.  Patient expressed intermittent episodes of heart racing sensation. She denies fevers, abdominal pain, nausea, vomiting, diarrhea, dysuria, hematuria, melena, headaches or focal weakness. No sick contacts reported. Up to date on flu and pneumonia vaccines.  Patient reports to be compliant with her medications.  In the ED patient found to be on A. fib with RVR, tachypneic with a chest x-ray demonstrating left lower lobe pneumonia, and vascular congestion.  BNP was elevated.  Cultures taken, patient started on Levaquin and cardizem drip. Troponin neg x1. TRH consult to admit patient for further evaluation and management.   Past Medical/Surgical History: Past Medical History:  Diagnosis Date  . Anxiety   . Arthritis    "fingers, right toe" (08/09/2016)  . Basal cell carcinoma of lower leg, right   . Bradycardia    a. Holter 08/30/16 showed profound bradycardia down to 30 during awake hours, several 2 second pauses, very frequent PVCs >8000 in 48 hours, and NSVT (longest of 14 beats).  . Cardiomyopathy ([place])    a. EF 40% by echo 08/2016.  . Daily headache    "I usually wake up w/a headache; sometimes it's a migraine" (08/09/2016)  . Depression   . Diverticulosis    Hx. of  . Frequent PVCs   . GERD (gastroesophageal  reflux disease)   . Hypercholesterolemia   . Migraine   . Mitral regurgitation   . MVP (mitral valve prolapse)   . NSVT (nonsustained ventricular tachycardia) ([place])    a. first noted event monitor 08/2016.  . Osteoporosis   . PAF (paroxysmal atrial fibrillation) ([place])    a. in afib at time of echo 08/2016.  Marland Kitchen Scoliosis    mild  . Squamous cell carcinoma of neck     Past Surgical History:  Procedure Laterality Date  . BASAL CELL CARCINOMA EXCISION Right    RLE  . BLEPHAROPLASTY    . BREAST BIOPSY Left   . BREAST CYST ASPIRATION Left   . BREAST CYST EXCISION Left   . BUNIONECTOMY Bilateral   . DILATION AND CURETTAGE OF UTERUS    . EXCISIONAL HEMORRHOIDECTOMY    . INTRAMEDULLARY (IM) NAIL INTERTROCHANTERIC Left 12/31/2017   Procedure: INTRAMEDULLARY (IM) NAIL INTERTROCHANTRIC;  Surgeon: Nicholes Stairs, MD;  Location: [facility];  Service: Orthopedics;  Laterality: Left;  . KNEE ARTHROSCOPY Right 04/12/2007  . KNEE ARTHROSCOPY Left   . SQUAMOUS CELL CARCINOMA EXCISION     "neck"  . TONSILLECTOMY      Social History:  reports that she quit smoking about 31 years ago. Her smoking use included cigarettes. She has a 20.00 pack-year smoking history. She has never used smokeless tobacco. She reports that she does not drink alcohol or use drugs.  Allergies: Allergies  Allergen Reactions  . Omeprazole Other (See Comments)    siezure  . Flagyl [Metronidazole] Nausea And Vomiting  . Aripiprazole  Other (See Comments)    Unknown reaction  . Hylan G-F 20 Other (See Comments)    Unknown reaction  . Paroxetine Hcl Other (See Comments)    Headaches (a long time ago- pt doesn't really remember)  . Statins Other (See Comments)    No specific reaction given-patient states that she was advised not to take by physician  . Sulfa Antibiotics Diarrhea and Other (See Comments)    Colitis   . Toprol Xl [Metoprolol Succinate] Other (See Comments)    Dizziness--pt doesn't really remember     . Vancomycin Itching and Other (See Comments)    Pt reports that they gave it too fast- she started having itching in the scalp  . Penicillins Rash    DID THE REACTION INVOLVE: Swelling of the face/tongue/throat, SOB, or low BP? No Sudden or severe rash/hives, skin peeling, or the inside of the mouth or nose? Yes Did it require medical treatment? Unknown When did it last happen?Over 10 years If all above answers are "NO", may proceed with cephalosporin use.     Family History:  Family History  Problem Relation Age of Onset  . Heart failure Mother   . Leukemia Mother   . Heart failure Father     Prior to Admission medications   Medication Sig Start Date End Date Taking? Authorizing Provider  ammonium lactate (LAC-HYDRIN) 12 % lotion Apply 1 application topically 2 (two) times daily as needed for dry skin.   Yes [provider]  apixaban (ELIQUIS) 5 MG TABS tablet Take 1 tablet (5 mg total) by mouth 2 (two) times daily. 11/03/18  Yes Allred, Jeneen Rinks, MD  bisoprolol (ZEBETA) 5 MG tablet Take 2.5 mg by mouth at bedtime.   Yes [provider]  Emollient ([place] [employer] HAND & BODY) LOTN Apply 1 application topically daily as needed.   Yes [provider]  Glycerin-Hypromellose-PEG 400 (HM DRY EYE RELIEF) 0.2-0.2-1 % SOLN Apply 1-2 drops to eye daily as needed (for dry eye relief).   Yes [provider]  LORazepam (ATIVAN) 1 MG tablet Take 1 tablet (1 mg total) at bedtime by mouth. 1  qhs  Half  qam 08/17/17  Yes Plovsky, Berneta Sages, MD  polyethylene glycol (MIRALAX / GLYCOLAX) packet Take 17 g by mouth daily as needed for mild constipation.    Yes [provider]    Review of Systems:  Negative except as otherwise mentioned in HPI.  Physical Exam: Vitals:   11/18/18 1530 11/18/18 1551 11/18/18 1553 11/18/18 1700  BP:   (!) 166/131 101/74  Pulse:  96    Resp: (!) 24 (!) 23 (!) 24 15  Temp:      TempSrc:      SpO2: 95%  93% 92%  Weight:       Height:        Constitutional: Mild respiratory distress and with present increased heart rate during examination.  Denies chest pain.  No nausea, no vomiting.  Patient is afebrile. Eyes: PERRL, lids and conjunctivae normal.  No icterus, no nystagmus. ENMT: Mucous membranes are moist. Posterior pharynx clear of any exudate or lesions. Neck: normal, supple, no masses, no thyromegaly, no JVD. Respiratory: Fine crackles at the bases, positive rhonchi, no wheezing.  No using accessory muscles.  Positive tachypnea.   Cardiovascular: Irregular irregular, soft systolic murmur appreciated on exam; no rubs, no gallops.  Trace to 1+ edema bilaterally appreciated on LE. Abdomen: no tenderness, no masses palpated. No hepatosplenomegaly. Bowel sounds positive.  Musculoskeletal: no clubbing / cyanosis. No joint deformity upper and lower extremities. Good ROM, no contractures. Normal muscle tone.  Skin: no rashes, lesions, ulcers. No induration Neurologic: CN 2-12 grossly intact. Sensation intact, DTR normal. Strength 5/5 in all 4.  Psychiatric: Normal judgment and insight. Alert and oriented x 3. Normal mood.    Labs on Admission: I have personally reviewed the following labs and imaging studies  CBC: Recent Labs  Lab 11/18/18 1256  WBC 7.4  NEUTROABS 4.5  HGB 12.8  HCT 40.5  MCV 97.8  PLT 790   Basic Metabolic Panel: Recent Labs  Lab 11/18/18 1256  NA 140  K 3.9  CL 112*  CO2 19*  GLUCOSE 119*  BUN 16  CREATININE 1.07*  CALCIUM 8.9   GFR: Estimated Creatinine Clearance: 35.8 mL/min (A) (by C-G formula based on SCr of 1.07 mg/dL (H)).  Cardiac Enzymes: Recent Labs  Lab 11/18/18 1256  TROPONINI <0.03   Urine analysis:    Component Value Date/Time   COLORURINE YELLOW 08/09/2016 1721   APPEARANCEUR CLEAR 08/09/2016 1721   LABSPEC >1.046 (H) 08/09/2016 1721   PHURINE 8.0 08/09/2016 1721   GLUCOSEU NEGATIVE 08/09/2016 1721   HGBUR NEGATIVE 08/09/2016 1721   BILIRUBINUR  NEGATIVE 08/09/2016 1721   KETONESUR NEGATIVE 08/09/2016 1721   PROTEINUR NEGATIVE 08/09/2016 1721   UROBILINOGEN 0.2 08/16/2008 2359   NITRITE NEGATIVE 08/09/2016 1721   LEUKOCYTESUR NEGATIVE 08/09/2016 1721    Recent Results (from the past 240 hour(s))  Culture, blood (routine x 2) Call MD if unable to obtain prior to antibiotics being given     Status: None (Preliminary result)   Collection Time: 11/18/18  2:33 PM  Result Value Ref Range Status   Specimen Description   Final    LEFT ANTECUBITAL BOTTLES DRAWN AEROBIC AND ANAEROBIC   Special Requests   Final    Blood Culture adequate volume Performed at [facility], [address] [place], [place] [address]    Culture PENDING  Incomplete   Report Status PENDING  Incomplete  Culture, blood (routine x 2) Call MD if unable to obtain prior to antibiotics being given     Status: None (Preliminary result)   Collection Time: 11/18/18  2:43 PM  Result Value Ref Range Status   Specimen Description BLOOD LEFT ARM BOTTLES DRAWN AEROBIC AND ANAEROBIC  Final   Special Requests   Final    Blood Culture adequate volume Performed at [facility], [address], [place],  [address]    Culture PENDING  Incomplete   Report Status PENDING  Incomplete     Radiological Exams on Admission: Dg Chest 2 View  Result Date: 11/18/2018 CLINICAL DATA:  85 year old female with history of chest tightness and shortness of breath for 1 week, progressively worsening. EXAM: CHEST - 2 VIEW COMPARISON:  Chest x-ray 10/18/2018. FINDINGS: Ill-defined airspace consolidation in the medial aspect of the left lower lobe, concerning for developing bronchopneumonia. Small left pleural effusion. Right lung is clear. No evidence of pulmonary edema. Heart size is mildly enlarged. Upper mediastinal contours are within normal limits. Aortic atherosclerosis. IMPRESSION: 1. Findings are concerning for left lower lobe pneumonia with small left parapneumonic pleural effusion.  2. Mild cardiomegaly. 3. Aortic atherosclerosis. Electronically Signed   By: Vinnie Langton M.D.   On: 11/18/2018 13:52    EKG: Independently reviewed.  A. fib with RVR appreciated on EKG.  Assessment/Plan 1-atrial fibrillation with RVR ([place]) -Patient with history of paroxysmal atrial fibrillation -Presented with acute  A. fib with RVR; most likely triggered by pneumonia -Admit to stepdown,*Cardizem drip, resume low-dose bisoprolol nightly and continue the use of Eliquis. -Will repeat 2D echo and cycle troponin -Treat pneumonia and follow/replete electrolytes. -Check TSH. -Follow clinical response.  2-SOB and increase LE swelling: -In the setting of community-acquired pneumonia along with acute on chronic diastolic heart failure. -Patient heart failure most likely triggered by component of A. fib with RVR. -BNP elevated -Will provide antibiotics, flutter valve, nebulization and as needed oxygen supplementation from pneumonia standpoint. -Daily weights, strict intake and output, IV Lasix and low-sodium diet. -As mentioned above will check troponin and repeat echo. -follow cultures, strep pneumo and legionella antigen. -patient would be also tested for influenza.  3-History of depression and anxiety -continue zoloft and PRN ativan  4-Mixed hyperlipidemia -no using statins currently  5-Stroke (cerebrum) (HCC) -continue eliquis for secondary prevention -no acute focal deficit appreciated or reported on exam.  6-GERD (gastroesophageal reflux disease) -will use pepcid  7-dry eye syndrome -will continue Glycerin-Hypromellose eye drops as needed.   DVT prophylaxis: Patient on Eliquis. Code Status: Full code Family Communication: Husband and son at bedside Disposition Plan: Anticipate discharge back home once breathing is stabilized, pneumonia treated and heart rate control. Consults called: Cardiology was curbside over the phone (Dr. Radford Pax). Admission status: Inpatient, length  of stay more than 2 midnights; stepdown.   Time Spent: 70 minutes  Barton Dubois MD [facility] Pager [phone]  11/18/2018, 6:04 PM

## 2018-11-19 ENCOUNTER — Inpatient Hospital Stay (HOSPITAL_COMMUNITY): Payer: Medicare Other

## 2018-11-19 DIAGNOSIS — I340 Nonrheumatic mitral (valve) insufficiency: Secondary | ICD-10-CM

## 2018-11-19 DIAGNOSIS — F411 Generalized anxiety disorder: Secondary | ICD-10-CM

## 2018-11-19 DIAGNOSIS — E782 Mixed hyperlipidemia: Secondary | ICD-10-CM

## 2018-11-19 DIAGNOSIS — I361 Nonrheumatic tricuspid (valve) insufficiency: Secondary | ICD-10-CM

## 2018-11-19 DIAGNOSIS — R0602 Shortness of breath: Secondary | ICD-10-CM

## 2018-11-19 LAB — TROPONIN I
Troponin I: <0.03 ng/mL (ref <0.03)
Troponin I: <0.03 ng/mL (ref <0.03)

## 2018-11-19 LAB — CBC
HCT: 40.2 % (ref 36.0–46.0)
HEMOGLOBIN: 12.7 g/dL (ref 12.0–15.0)
MCH: 31.1 pg (ref 26.0–34.0)
MCHC: 31.6 g/dL (ref 30.0–36.0)
MCV: 98.5 fL (ref 80.0–100.0)
Platelets: 241 10*3/uL (ref 150–400)
RBC: 4.08 MIL/uL (ref 3.87–5.11)
RDW: 15.1 % (ref 11.5–15.5)
WBC: 7.1 10*3/uL (ref 4.0–10.5)
nRBC: 0 % (ref 0.0–0.2)

## 2018-11-19 LAB — BASIC METABOLIC PANEL
ANION GAP: 11 (ref 5–15)
BUN: 15 mg/dL (ref 8–23)
CO2: 21 mmol/L — ABNORMAL LOW (ref 22–32)
Calcium: 9 mg/dL (ref 8.9–10.3)
Chloride: 109 mmol/L (ref 98–111)
Creatinine, Ser: 1.1 mg/dL — ABNORMAL HIGH (ref 0.44–1.00)
GFR calc non Af Amer: 46 mL/min — ABNORMAL LOW (ref >60)
GFR, EST AFRICAN AMERICAN: 54 mL/min — AB (ref >60)
Glucose, Bld: 97 mg/dL (ref 70–99)
POTASSIUM: 3.5 mmol/L (ref 3.5–5.1)
Sodium: 141 mmol/L (ref 135–145)

## 2018-11-19 LAB — MRSA PCR SCREENING: MRSA by PCR: NEGATIVE

## 2018-11-19 LAB — STREP PNEUMONIAE URINARY ANTIGEN: Strep Pneumo Urinary Antigen: NEGATIVE

## 2018-11-19 LAB — ECHOCARDIOGRAM COMPLETE
Height: 65 in
Weight: 2335.11 oz

## 2018-11-19 MED ORDER — LEVOFLOXACIN 500 MG PO TABS
500.0000 mg | ORAL_TABLET | Freq: Every day | ORAL | Status: AC
  Administered 2018-11-20 – 2018-11-22 (×3): 500 mg via ORAL
  Filled 2018-11-19 (×3): qty 1

## 2018-11-19 MED ORDER — LORAZEPAM 1 MG PO TABS
1.0000 mg | ORAL_TABLET | Freq: Every day | ORAL | Status: DC
  Administered 2018-11-19 – 2018-11-21 (×3): 1 mg via ORAL
  Filled 2018-11-19 (×3): qty 1

## 2018-11-19 MED ORDER — LEVOFLOXACIN IN D5W 500 MG/100ML IV SOLN
500.0000 mg | INTRAVENOUS | Status: DC
  Administered 2018-11-19: 500 mg via INTRAVENOUS
  Filled 2018-11-19: qty 100

## 2018-11-19 MED ORDER — DILTIAZEM HCL 60 MG PO TABS
60.0000 mg | ORAL_TABLET | Freq: Three times a day (TID) | ORAL | Status: DC
  Administered 2018-11-19 – 2018-11-20 (×3): 60 mg via ORAL
  Filled 2018-11-19 (×3): qty 1

## 2018-11-19 NOTE — Progress Notes (Signed)
  Echocardiogram 2D Echocardiogram has been performed.  Carol Hardin 11/19/2018, 9:47 AM

## 2018-11-19 NOTE — Progress Notes (Signed)
PROGRESS NOTE    DMIYAH Hardin  YTK:[id number] DOB: December 26, 1932 DOA: 11/18/2018 PCP: Celene Squibb, MD    Brief Narrative:  85 y.o. female with significant past medical history as mentioned below, who presented to the emergency department complaining of gradual onset worsening persistent shortness of breath for over a week now.  Patient reports chest tightness sensation associated with shortness of breath and also experiencing dry cough.  She has noticed increased swelling in her legs and also positive orthopnea.  Patient expressed intermittent episodes of heart racing sensation. She denies fevers, abdominal pain, nausea, vomiting, diarrhea, dysuria, hematuria, melena, headaches or focal weakness. No sick contacts reported. Up to date on flu and pneumonia vaccines.  Patient reports to be compliant with her medications.  In the ED patient found to be on A. fib with RVR, tachypneic with a chest x-ray demonstrating left lower lobe pneumonia, and vascular congestion   Assessment & Plan: 1-Atrial fibrillation with RVR (HCC) -Currently rate controlled after using Cardizem drip -Will transition to oral regimen and is well-tolerated move patient to telemetry bed. -Continue Eliquis for secondary prevention -Repeated echo demonstrated decrease in her ejection fraction to 40-45%; cardiology will be consulted. -Continue the use on low-dose bisoprolol nightly. -TSH within normal limits. -Continue treatment for pneumonia.  2-shortness of breath and increased lower extremity swelling -In the setting of community-acquired pneumonia along with acute on chronic diastolic heart failure -Patient heart failure most likely triggered by component of A. fib with RVR as mentioned above. -BNP was elevated on presentation. -Continue antibiotics, flutter valve, nebulization and as needed oxygen supplementation from pneumonia standpoint.  Will follow culture. -Influenza was negative -Antibiotics will be  transitioned to oral regimen. -Continue IV Lasix and low-sodium diet -Follow Daily weights, strict intake and output -Continue bisoprolol. -Patient denies chest pain.  3-history of depression and anxiety -Patient has clarified no using Zoloft on regular basis.  Medication will be discontinued -She denies suicidal ideation or hallucination at this time -Continue Ativan nightly.  4-history of hyperlipidemia -Heart healthy diet has been encouraged -Currently no using statins.  5-history of stroke -Continue Eliquis for secondary prevention -No acute focal deficit appreciated  6-gastroesophageal reflux disease -Continue the use of Pepcid.  7-dry eye syndrome -Will continue eyedrops as needed.   History of depression   Generalized anxiety disorder   Mixed hyperlipidemia   Stroke (cerebrum) (HCC)   GERD (gastroesophageal reflux disease)   CAP (community acquired pneumonia)   Acute on chronic diastolic HF (heart failure) (HCC)  8-chronic kidney disease stage III -Stable and at baseline -Will continue to monitor trend especially with active diuresis.  DVT prophylaxis: Eliquis Code Status: Full code Family Communication: No family at bedside. Disposition Plan: Transfer to telemetry bed if heart rate control after transition of Cardizem drip.  Transition antibiotics to oral regimen and continue IV Lasix.  Repeat chest x-ray in a.m.  Consultants:   Cardiology  Procedures:   2D echo:  1. The left ventricle has mild-moderately reduced systolic function of 75-17%. The cavity size was normal. There is concentric left ventricular hypertrophy. Left ventricular diastology could not be evaluated secondary to atrial fibrillation. Elevated  left ventricular end-diastolic pressure Left ventricular diffuse hypokinesis.  2. The right ventricle has moderately reduced systolic function. The cavity was mildly enlarged. There is no increase in right ventricular wall thickness.  3. Left atrial  size was mildly dilated.  4. Right atrial size was moderately dilated.  5. Moderate pleural effusion in the left lateral region.  6. The mitral valve is myxomatous. There is mild thickening. Mitral valve regurgitation is moderate by color flow Doppler.  7. The tricuspid valve is normal in structure.  8. The aortic valve is tricuspid Aortic valve regurgitation is mild by color flow Doppler.  9. There is dilatation of the aortic root. 10. The inferior vena cava was dilated in size with >50% respiratory variability.  Antimicrobials:  Anti-infectives (From admission, onward)   Start     Dose/Rate Route Frequency Ordered Stop   11/19/18 1545  levofloxacin (LEVAQUIN) tablet 500 mg     500 mg Oral Daily 11/19/18 1533     11/19/18 1415  levofloxacin (LEVAQUIN) IVPB 500 mg  Status:  Discontinued     500 mg 100 mL/hr over 60 Minutes Intravenous Every 24 hours 11/19/18 1126 11/19/18 1533   11/18/18 1415  levofloxacin (LEVAQUIN) IVPB 750 mg  Status:  Discontinued     750 mg 100 mL/hr over 90 Minutes Intravenous Every 24 hours 11/18/18 1403 11/19/18 1126      Subjective: No fever, no chest pain, no nausea, no vomiting.  Reports he still feeling short of breath and having orthopnea.  Positive dry coughing spells and mild sensation of heart racing.  She reported good urine output and expressed Having difficult time sleeping last night.  Objective: Vitals:   11/19/18 1200 11/19/18 1300 11/19/18 1348 11/19/18 1400  BP: 105/90 (!) 91/49  98/72  Pulse: (!) 55 84  90  Resp: 19 (!) 27  (!) 24  Temp:      TempSrc:      SpO2: 91% 94% 95% 96%  Weight:      Height:        Intake/Output Summary (Last 24 hours) at 11/19/2018 1536 Last data filed at 11/19/2018 1020 Gross per 24 hour  Intake 730.06 ml  Output 2650 ml  Net -1919.94 ml   Filed Weights   11/18/18 2008 11/19/18 0500 11/19/18 0637  Weight: 65.5 kg 65.5 kg 66.2 kg    Examination: General exam: Alert, awake, oriented x 3; no requiring  oxygen supplementation at this time.  Denying chest pain.  Patient expressed intermittent sensation of heart racing and also coughing spells.  Reports having significant trouble sleeping last night. Respiratory system: Fine crackles at the bases, no wheezing, positive rhonchi right, normal respiratory effort.  No using accessory muscles.   Cardiovascular system: Rate control, irregular breathing; soft systolic murmur appreciated on exam.  No Rubs, no gallops  Gastrointestinal system: Abdomen is nondistended, soft and nontender. No organomegaly or masses felt. Normal bowel sounds heard. Central nervous system: Alert and oriented. No focal neurological deficits. Extremities: No C/C/E, +pedal pulses Skin: No rashes, lesions or ulcers Psychiatry: Judgement and insight appear normal. Mood & affect appropriate.     Data Reviewed: I have personally reviewed following labs and imaging studies  CBC: Recent Labs  Lab 11/18/18 1256 11/19/18 0601  WBC 7.4 7.1  NEUTROABS 4.5  --   HGB 12.8 12.7  HCT 40.5 40.2  MCV 97.8 98.5  PLT 245 161   Basic Metabolic Panel: Recent Labs  Lab 11/18/18 1256 11/19/18 0601  NA 140 141  K 3.9 3.5  CL 112* 109  CO2 19* 21*  GLUCOSE 119* 97  BUN 16 15  CREATININE 1.07* 1.10*  CALCIUM 8.9 9.0   GFR: Estimated Creatinine Clearance: 34.9 mL/min (A) (by C-G formula based on SCr of 1.1 mg/dL (H)).   Liver Function Tests: Recent Labs  Lab 11/18/18 1827  AST 38  ALT 44  ALKPHOS 58  BILITOT 1.1  PROT 6.5  ALBUMIN 3.6   Cardiac Enzymes: Recent Labs  Lab 11/18/18 1256 11/18/18 1827 11/18/18 2336 11/19/18 0601  TROPONINI <0.03 <0.03 <0.03 <0.03   Thyroid Function Tests: Recent Labs    11/18/18 1827  TSH 3.484   Urine analysis:    Component Value Date/Time   COLORURINE YELLOW 08/09/2016 1721   APPEARANCEUR CLEAR 08/09/2016 1721   LABSPEC >1.046 (H) 08/09/2016 1721   PHURINE 8.0 08/09/2016 1721   GLUCOSEU NEGATIVE 08/09/2016 1721   HGBUR  NEGATIVE 08/09/2016 1721   BILIRUBINUR NEGATIVE 08/09/2016 1721   KETONESUR NEGATIVE 08/09/2016 1721   PROTEINUR NEGATIVE 08/09/2016 1721   UROBILINOGEN 0.2 08/16/2008 2359   NITRITE NEGATIVE 08/09/2016 1721   LEUKOCYTESUR NEGATIVE 08/09/2016 1721    Recent Results (from the past 240 hour(s))  Culture, blood (routine x 2) Call MD if unable to obtain prior to antibiotics being given     Status: None (Preliminary result)   Collection Time: 11/18/18  2:33 PM  Result Value Ref Range Status   Specimen Description   Final    LEFT ANTECUBITAL BOTTLES DRAWN AEROBIC AND ANAEROBIC   Special Requests Blood Culture adequate volume  Final   Culture   Final    NO GROWTH < 24 HOURS Performed at [facility], [address], [place], [place] [address]    Report Status PENDING  Incomplete  Culture, blood (routine x 2) Call MD if unable to obtain prior to antibiotics being given     Status: None (Preliminary result)   Collection Time: 11/18/18  2:43 PM  Result Value Ref Range Status   Specimen Description BLOOD LEFT ARM BOTTLES DRAWN AEROBIC AND ANAEROBIC  Final   Special Requests Blood Culture adequate volume  Final   Culture   Final    NO GROWTH < 24 HOURS Performed at [facility], [address] [place], [place] [address]    Report Status PENDING  Incomplete  MRSA PCR Screening     Status: None   Collection Time: 11/18/18  8:51 PM  Result Value Ref Range Status   MRSA by PCR NEGATIVE NEGATIVE Final    Comment:        The GeneXpert MRSA Assay (FDA approved for NASAL specimens only), is one component of a comprehensive MRSA colonization surveillance program. It is not intended to diagnose MRSA infection nor to guide or monitor treatment for MRSA infections. Performed at [facility], [address], [place], [place] [address]      Radiology Studies: Dg Chest 2 View  Result Date: 11/18/2018 CLINICAL DATA:  85 year old female with history of chest tightness and shortness of breath for  1 week, progressively worsening. EXAM: CHEST - 2 VIEW COMPARISON:  Chest x-ray 10/18/2018. FINDINGS: Ill-defined airspace consolidation in the medial aspect of the left lower lobe, concerning for developing bronchopneumonia. Small left pleural effusion. Right lung is clear. No evidence of pulmonary edema. Heart size is mildly enlarged. Upper mediastinal contours are within normal limits. Aortic atherosclerosis. IMPRESSION: 1. Findings are concerning for left lower lobe pneumonia with small left parapneumonic pleural effusion. 2. Mild cardiomegaly. 3. Aortic atherosclerosis. Electronically Signed   By: Vinnie Langton M.D.   On: 11/18/2018 13:52    Scheduled Meds: . apixaban  5 mg Oral BID  . bisoprolol  2.5 mg Oral QHS  . diltiazem  60 mg Oral Q8H  . famotidine  20 mg Oral Daily  . feeding supplement (ENSURE  ENLIVE)  237 mL Oral BID BM  . furosemide  20 mg Intravenous Daily  . ipratropium  0.5 mg Nebulization QID  . levofloxacin  500 mg Oral Daily  . LORazepam  1 mg Oral QHS  . sodium chloride flush  3 mL Intravenous Q12H   Continuous Infusions: . sodium chloride    . diltiazem (CARDIZEM) infusion Stopped (11/19/18 1012)  . diltiazem (CARDIZEM) infusion       LOS: 1 day    Time spent: 35 minutes. Greater than 50% of this time was spent in direct contact with the patient, coordinating care and discussing relevant ongoing clinical issues, including need for calcium channel blocker, anticipated transition to oral regimen and adjusting medications for renal function. I have discussed in details her home medication list and make adjustments on regimen base on correct medications dosage.     Barton Dubois, MD [facility] Pager [phone]  11/19/2018, 3:36 PM

## 2018-11-20 ENCOUNTER — Inpatient Hospital Stay (HOSPITAL_COMMUNITY): Payer: Medicare Other

## 2018-11-20 DIAGNOSIS — I5021 Acute systolic (congestive) heart failure: Secondary | ICD-10-CM

## 2018-11-20 DIAGNOSIS — I495 Sick sinus syndrome: Secondary | ICD-10-CM

## 2018-11-20 DIAGNOSIS — E46 Unspecified protein-calorie malnutrition: Secondary | ICD-10-CM

## 2018-11-20 DIAGNOSIS — J189 Pneumonia, unspecified organism: Principal | ICD-10-CM

## 2018-11-20 DIAGNOSIS — E440 Moderate protein-calorie malnutrition: Secondary | ICD-10-CM

## 2018-11-20 LAB — CBC
HCT: 40.1 % (ref 36.0–46.0)
Hemoglobin: 13 g/dL (ref 12.0–15.0)
MCH: 31.3 pg (ref 26.0–34.0)
MCHC: 32.4 g/dL (ref 30.0–36.0)
MCV: 96.6 fL (ref 80.0–100.0)
Platelets: 252 10*3/uL (ref 150–400)
RBC: 4.15 MIL/uL (ref 3.87–5.11)
RDW: 14.9 % (ref 11.5–15.5)
WBC: 7.2 10*3/uL (ref 4.0–10.5)
nRBC: 0 % (ref 0.0–0.2)

## 2018-11-20 LAB — BASIC METABOLIC PANEL
Anion gap: 9 (ref 5–15)
BUN: 15 mg/dL (ref 8–23)
CO2: 21 mmol/L — ABNORMAL LOW (ref 22–32)
Calcium: 9 mg/dL (ref 8.9–10.3)
Chloride: 109 mmol/L (ref 98–111)
Creatinine, Ser: 1.14 mg/dL — ABNORMAL HIGH (ref 0.44–1.00)
GFR calc Af Amer: 51 mL/min — ABNORMAL LOW (ref >60)
GFR calc non Af Amer: 44 mL/min — ABNORMAL LOW (ref >60)
Glucose, Bld: 99 mg/dL (ref 70–99)
Potassium: 3.5 mmol/L (ref 3.5–5.1)
Sodium: 139 mmol/L (ref 135–145)

## 2018-11-20 LAB — HIV ANTIBODY (ROUTINE TESTING W REFLEX): HIV Screen 4th Generation wRfx: NONREACTIVE

## 2018-11-20 MED ORDER — ADULT MULTIVITAMIN W/MINERALS CH
1.0000 | ORAL_TABLET | Freq: Every day | ORAL | Status: DC
  Administered 2018-11-20 – 2018-11-22 (×3): 1 via ORAL
  Filled 2018-11-20 (×3): qty 1

## 2018-11-20 MED ORDER — DIGOXIN 0.25 MG/ML IJ SOLN
0.5000 mg | Freq: Once | INTRAMUSCULAR | Status: AC
  Administered 2018-11-20: 0.5 mg via INTRAVENOUS
  Filled 2018-11-20: qty 2

## 2018-11-20 MED ORDER — DILTIAZEM HCL 30 MG PO TABS
30.0000 mg | ORAL_TABLET | Freq: Two times a day (BID) | ORAL | Status: DC
  Administered 2018-11-20 – 2018-11-21 (×3): 30 mg via ORAL
  Filled 2018-11-20 (×3): qty 1

## 2018-11-20 NOTE — Progress Notes (Signed)
Initial Nutrition Assessment  DOCUMENTATION CODES:  Moderate malnutrition in the context of acute community acquired pneumonia and acute on chronic CHF  INTERVENTION:  Ensure Enlive po BID, each supplement provides 350 kcal and 20 grams of protein   Review of high sodium foods provided and offered options for increasing protein/energy intake when having breathing difficulty or appetite is poor  NUTRITION DIAGNOSIS:   Moderate Malnutrition related to acute illness, chronic illness, decreased appetite(acute on chronic CHF and CAP) as evidenced by per patient/family report, energy intake < 75% for > 7 days, mild muscle depletion, moderate muscle depletion.   GOAL:  Patient will meet greater than or equal to 90% of their needs   MONITOR:   PO intake, Supplement acceptance, Weight trends, Labs   REASON FOR ASSESSMENT:   Malnutrition Screening Tool    ASSESSMENT: Patient is an 85 yo female from home  with history of GERD, Diverticulosis, CVA, daily headache and depression. Patient presents complaining of SOB for the week prior to admission. Acute on chronic CHF and community acquired pneumonia.   Patient has not felt like cooking lately and her husband has been preparing meals which has consisted mostly of convenience foods like Frozen meals. They do alternate hot breakfast of eggs, bacon and whole wheat toast on day and/or cold cereal blueberry cheerios with juice and coffee.  Patient ate 50% of her breakfast this morning. Able to feed herself. She affirms that she has not been eating as well due to her recent breathing difficulty.   Patient weight has decreased mildly (~3%) the past month. We talked about ways to increased nutrition intake daily by using liquid supplements to support adequate intake also suggested [place] yogurt as a snack option to improve protein intake. Also RD cautioned pt and spouse about the often high sodium content in many convenience foods.  Medications reviewed  and include: Cardizem, Pepcid, Levaquin,     Labs: BMP Latest Ref Rng & Units 11/20/2018 11/19/2018 11/18/2018  Glucose 70 - 99 mg/dL 99 97 119(H)  BUN 8 - 23 mg/dL 15 15 16   Creatinine 0.44 - 1.00 mg/dL 1.14(H) 1.10(H) 1.07(H)  Sodium 135 - 145 mmol/L 139 141 140  Potassium 3.5 - 5.1 mmol/L 3.5 3.5 3.9  Chloride 98 - 111 mmol/L 109 109 112(H)  CO2 22 - 32 mmol/L 21(L) 21(L) 19(L)  Calcium 8.9 - 10.3 mg/dL 9.0 9.0 8.9     NUTRITION - FOCUSED PHYSICAL EXAM:    Most Recent Value  Orbital Region  Mild depletion  Upper Arm Region  Mild depletion  Temple Region  Mild depletion  Clavicle Bone Region  Severe depletion  Clavicle and Acromion Bone Region  Moderate depletion  Dorsal Hand  Mild depletion      Diet Order:   Diet Order            Diet Heart Room service appropriate? Yes; Fluid consistency: Thin  Diet effective now              EDUCATION NEEDS:   Education needs have been addressed Skin:  Skin Assessment: Reviewed RN Assessment  Last BM:  2/9  Height:   Ht Readings from Last 1 Encounters:  11/18/18 5\' 5"  (1.651 m)    Weight:   Wt Readings from Last 1 Encounters:  11/20/18 63.9 kg    Ideal Body Weight:  57 kg  BMI:  Body mass index is 23.44 kg/m.  Estimated Nutritional Needs:   Kcal:  0923-3007 (26-28 kcal/kg/bw)  Protein:  76-83 gr (1.2-1.3 gr/kg/bw)  Fluid:  per MD goals   Colman Cater MS,RD,CSG,LDN Office: [phone] Pager: [phone]

## 2018-11-20 NOTE — Consult Note (Signed)
Cardiology Consultation:   Patient ID: Carol Hardin MRN: [id number]; DOB: 1933-09-27  Admit date: 11/18/2018 Date of Consult: 11/20/2018  Primary Care Provider: Celene Squibb, MD Primary Cardiologist: Carlyle Dolly, MD  Primary Electrophysiologist:  None    Patient Profile:   Carol Hardin is a 85 y.o. female with a hx of tachy brady syndrome who is being seen today for the evaluation of afib with RVR at the request of Dr Barton Dubois.  History of Present Illness:   Carol Hardin 85 yo female history of tach brady syndrome, afib, CVA admitted with pneumonia. During admission issues with afib with RVR and diastolic HF   Previously seen by Dr Mare Ferrari, Dr Meda Coffee, Dr Harl Bowie, Dr Wynonia Lawman, Dr Lovena Le, and Dr Rayann Heman. HIstory of noncompliance with medical advice. History of mild to moderate MR, bradycardia, PVCs, afib, prior CVA.   Prior heart monitors have shows severe bradycardia to 30s, several 2 second pauses and frequent PVCs, 14 beat of NSVT. Asked by several providers over time to stop bisoprolol but she has continued. Was to have lexiscan for NSVT but turned down.   Admit 05/2018 with CVA, she had previously refused anticoag but was started at that time. She feels eliquis causes some colitis, and thus will only take the 2.5mg  bid dosing despite recommendations from her general cardiolgists and EP evaluation   Seen by Dr Sabino Niemann Jan 2020. Recs for strict compliance with eliquis 5mg  bid, and consideration for tikosyn load after that. She has wanted to avoid pacing.    Admitted with SOB and chest tightness, LE edema, palpitations. Has been managed for pneumonia by primary team.   Past Medical History:  Diagnosis Date  . Anxiety   . Arthritis    "fingers, right toe" (08/09/2016)  . Basal cell carcinoma of lower leg, right   . Bradycardia    a. Holter 08/30/16 showed profound bradycardia down to 30 during awake hours, several 2 second pauses, very frequent PVCs >8000 in 48  hours, and NSVT (longest of 14 beats).  . Cardiomyopathy ([place])    a. EF 40% by echo 08/2016.  . Daily headache    "I usually wake up w/a headache; sometimes it's a migraine" (08/09/2016)  . Depression   . Diverticulosis    Hx. of  . Frequent PVCs   . GERD (gastroesophageal reflux disease)   . Hypercholesterolemia   . Migraine   . Mitral regurgitation   . MVP (mitral valve prolapse)   . NSVT (nonsustained ventricular tachycardia) ([place])    a. first noted event monitor 08/2016.  . Osteoporosis   . PAF (paroxysmal atrial fibrillation) ([place])    a. in afib at time of echo 08/2016.  Marland Kitchen Scoliosis    mild  . Squamous cell carcinoma of neck     Past Surgical History:  Procedure Laterality Date  . BASAL CELL CARCINOMA EXCISION Right    RLE  . BLEPHAROPLASTY    . BREAST BIOPSY Left   . BREAST CYST ASPIRATION Left   . BREAST CYST EXCISION Left   . BUNIONECTOMY Bilateral   . DILATION AND CURETTAGE OF UTERUS    . EXCISIONAL HEMORRHOIDECTOMY    . INTRAMEDULLARY (IM) NAIL INTERTROCHANTERIC Left 12/31/2017   Procedure: INTRAMEDULLARY (IM) NAIL INTERTROCHANTRIC;  Surgeon: Nicholes Stairs, MD;  Location: [facility];  Service: Orthopedics;  Laterality: Left;  . KNEE ARTHROSCOPY Right 04/12/2007  . KNEE ARTHROSCOPY Left   . SQUAMOUS CELL CARCINOMA EXCISION     "neck"  . TONSILLECTOMY  Inpatient Medications: Scheduled Meds: . apixaban  5 mg Oral BID  . bisoprolol  2.5 mg Oral QHS  . diltiazem  60 mg Oral Q8H  . famotidine  20 mg Oral Daily  . feeding supplement (ENSURE ENLIVE)  237 mL Oral BID BM  . furosemide  20 mg Intravenous Daily  . ipratropium  0.5 mg Nebulization QID  . levofloxacin  500 mg Oral Daily  . LORazepam  1 mg Oral QHS  . sodium chloride flush  3 mL Intravenous Q12H   Continuous Infusions: . sodium chloride    . diltiazem (CARDIZEM) infusion Stopped (11/19/18 1012)  . diltiazem (CARDIZEM) infusion     PRN Meds: sodium chloride, acetaminophen,  levalbuterol, ondansetron (ZOFRAN) IV, polyethylene glycol, polyvinyl alcohol, sodium chloride flush  Allergies:    Allergies  Allergen Reactions  . Omeprazole Other (See Comments)    siezure  . Flagyl [Metronidazole] Nausea And Vomiting  . Aripiprazole Other (See Comments)    Unknown reaction  . Hylan G-F 20 Other (See Comments)    Unknown reaction  . Paroxetine Hcl Other (See Comments)    Headaches (a long time ago- pt doesn't really remember)  . Statins Other (See Comments)    No specific reaction given-patient states that she was advised not to take by physician  . Sulfa Antibiotics Diarrhea and Other (See Comments)    Colitis   . Toprol Xl [Metoprolol Succinate] Other (See Comments)    Dizziness--pt doesn't really remember   . Vancomycin Itching and Other (See Comments)    Pt reports that they gave it too fast- she started having itching in the scalp  . Penicillins Rash    DID THE REACTION INVOLVE: Swelling of the face/tongue/throat, SOB, or low BP? No Sudden or severe rash/hives, skin peeling, or the inside of the mouth or nose? Yes Did it require medical treatment? Unknown When did it last happen?Over 10 years If all above answers are "NO", may proceed with cephalosporin use.     Social History:   Social History   Socioeconomic History  . Marital status: Married    Spouse name: Not on file  . Number of children: Not on file  . Years of education: Not on file  . Highest education level: Not on file  Occupational History  . Not on file  Social Needs  . Financial resource strain: Somewhat hard  . Food insecurity:    Worry: Never true    Inability: Never true  . Transportation needs:    Medical: No    Non-medical: No  Tobacco Use  . Smoking status: Former Smoker    Packs/day: 1.00    Years: 20.00    Pack years: 20.00    Types: Cigarettes    Last attempt to quit: 1989    Years since quitting: 31.1  . Smokeless tobacco: Never Used  Substance and  Sexual Activity  . Alcohol use: No  . Drug use: No  . Sexual activity: Not Currently  Lifestyle  . Physical activity:    Days per week: 0 days    Minutes per session: 0 min  . Stress: Rather much  Relationships  . Social connections:    Talks on phone: Once a week    Gets together: Once a week    Attends religious service: Never    Active member of club or organization: No    Attends meetings of clubs or organizations: Never    Relationship status: Married  . Intimate partner violence:  Fear of current or ex partner: No    Emotionally abused: No    Physically abused: No    Forced sexual activity: No  Other Topics Concern  . Not on file  Social History Narrative  . Not on file    Family History:    Family History  Problem Relation Age of Onset  . Heart failure Mother   . Leukemia Mother   . Heart failure Father      ROS:  Please see the history of present illness.   All other ROS reviewed and negative.     Physical Exam/Data:   Vitals:   11/20/18 0519 11/20/18 0600 11/20/18 0754 11/20/18 0800  BP:  91/75  93/65  Pulse:  89  70  Resp:  18  (!) 4  Temp: 97.7 F (36.5 C)  98.5 F (36.9 C)   TempSrc: Axillary     SpO2:  95%  95%  Weight: 63.9 kg     Height:        Intake/Output Summary (Last 24 hours) at 11/20/2018 0900 Last data filed at 11/20/2018 0600 Gross per 24 hour  Intake 817.07 ml  Output 350 ml  Net 467.07 ml   Last 3 Weights 11/20/2018 11/19/2018 11/19/2018  Weight (lbs) 140 lb 14 oz 145 lb 15.1 oz 144 lb 6.4 oz  Weight (kg) 63.9 kg 66.2 kg 65.5 kg  Some encounter information is confidential and restricted. Go to Review Flowsheets activity to see all data.     Body mass index is 23.44 kg/m.  General:  Well nourished, well developed, in no acute distress HEENT: normal Lymph: no adenopathy Neck: no JVD Endocrine:  No thryomegaly Vascular: No carotid bruits; FA pulses 2+ bilaterally without bruits  Cardiac:  Irreg, no m/r/g, no jvd Lungs:   clear to auscultation bilaterally, no wheezing, rhonchi or rales  Abd: soft, nontender, no hepatomegaly  Ext: no edema Musculoskeletal:  No deformities, BUE and BLE strength normal and equal Skin: warm and dry  Neuro:  CNs 2-12 intact, no focal abnormalities noted Psych:  Normal affect     Laboratory Data:  Chemistry Recent Labs  Lab 11/18/18 1256 11/19/18 0601 11/20/18 0605  NA 140 141 139  K 3.9 3.5 3.5  CL 112* 109 109  CO2 19* 21* 21*  GLUCOSE 119* 97 99  BUN 16 15 15   CREATININE 1.07* 1.10* 1.14*  CALCIUM 8.9 9.0 9.0  GFRNONAA 48* 46* 44*  GFRAA 56* 54* 51*  ANIONGAP 9 11 9     Recent Labs  Lab 11/18/18 1827  PROT 6.5  ALBUMIN 3.6  AST 38  ALT 44  ALKPHOS 58  BILITOT 1.1   Hematology Recent Labs  Lab 11/18/18 1256 11/19/18 0601 11/20/18 0605  WBC 7.4 7.1 7.2  RBC 4.14 4.08 4.15  HGB 12.8 12.7 13.0  HCT 40.5 40.2 40.1  MCV 97.8 98.5 96.6  MCH 30.9 31.1 31.3  MCHC 31.6 31.6 32.4  RDW 15.3 15.1 14.9  PLT 245 241 252   Cardiac Enzymes Recent Labs  Lab 11/18/18 1256 11/18/18 1827 11/18/18 2336 11/19/18 0601  TROPONINI <0.03 <0.03 <0.03 <0.03   No results for input(s): TROPIPOC in the last 168 hours.  BNP Recent Labs  Lab 11/18/18 1256  BNP 942.0*    DDimer No results for input(s): DDIMER in the last 168 hours.  Radiology/Studies:  Dg Chest 2 View  Result Date: 11/18/2018 CLINICAL DATA:  85 year old female with history of chest tightness and shortness of breath for  1 week, progressively worsening. EXAM: CHEST - 2 VIEW COMPARISON:  Chest x-ray 10/18/2018. FINDINGS: Ill-defined airspace consolidation in the medial aspect of the left lower lobe, concerning for developing bronchopneumonia. Small left pleural effusion. Right lung is clear. No evidence of pulmonary edema. Heart size is mildly enlarged. Upper mediastinal contours are within normal limits. Aortic atherosclerosis. IMPRESSION: 1. Findings are concerning for left lower lobe pneumonia with  small left parapneumonic pleural effusion. 2. Mild cardiomegaly. 3. Aortic atherosclerosis. Electronically Signed   By: Vinnie Langton M.D.   On: 11/18/2018 13:52    Assessment and Plan:   1. Afib with Tachy-brady syndrome - issues with compliance as reported above, including refusal to take therapeutic dosing of eliquis and to stop her bisoprolol due to severe bradycardia on monitor - on bisoprolol 2.5mg  and dilt gtt for rate control. Off dilt gtt since yesterday AM and started on oral dilt, this AM afib rates 80s to 90s - exacerbation may be related to ongoing pneumonia  - lower dilt to 30mg  bid due to soft bp's, prior bradycardia. As infection resolves her rates should naturally come down.   - tachy brady with afib likely exacerbated by pneumonia, leading to exacerbation of HF - would look to rate control best we can, diurese her and complete treatment of pneumonia with close outpatient f/u to consider dofetilide loading as recommended by EP once acute issues are resolved.   2. Pneumonia - abx per primary team.   3. Acute systolic HF - LVE 72-62%, moderate RV dysfunction, moderate pleural effusion, moderaet MR - LVEF 2018 55-60%. Perhaps tachy medicated CM - I/Os incomplete. At least negatve 1.7 L. She is on lasix 20mg  IV daily, mild uptrend in Cr  - volume status appears improved, soft bp's would avoid further diuresis - likely can come off dilt in near future as infectious drive for tachycardia resolves - bp's too soft to consider additional CHF meds    Would watch rates and bp's today, possible d/c tomorrow. WOuld look to arrange afib clinic f/u in 1 to 2 weeks.      For questions or updates, please contact [facility] Please consult [email or web address] for contact info under     Signed, Carlyle Dolly, MD  11/20/2018 9:00 AM

## 2018-11-20 NOTE — Care Management Important Message (Signed)
Important Message  Patient Details  Name: Carol Hardin MRN: [id number] Date of Birth: 12-14-1932   Medicare Important Message Given:  Yes    Tommy Medal 11/20/2018, 3:25 PM

## 2018-11-20 NOTE — Progress Notes (Addendum)
PROGRESS NOTE    Carol Hardin  AQT:[id number] DOB: December 05, 1932 DOA: 11/18/2018 PCP: Celene Squibb, MD    Brief Narrative:  85 y.o. female with significant past medical history as mentioned below, who presented to the emergency department complaining of gradual onset worsening persistent shortness of breath for over a week now.  Patient reports chest tightness sensation associated with shortness of breath and also experiencing dry cough.  She has noticed increased swelling in her legs and also positive orthopnea.  Patient expressed intermittent episodes of heart racing sensation. She denies fevers, abdominal pain, nausea, vomiting, diarrhea, dysuria, hematuria, melena, headaches or focal weakness. No sick contacts reported. Up to date on flu and pneumonia vaccines.  Patient reports to be compliant with her medications.  In the ED patient found to be on A. fib with RVR, tachypneic with a chest x-ray demonstrating left lower lobe pneumonia, and vascular congestion   Assessment & Plan: 1-Atrial fibrillation with RVR (HCC) -Currently rate controlled after using Cardizem drip -Will continue adjusted dose of diltiazem as recommended by cardiology service. -Continue Eliquis for secondary prevention -Repeated echo demonstrated decrease in her ejection fraction to 40-45%; cardiology will be consulted. -Continue the use on low-dose bisoprolol nightly. -TSH within normal limits. -Continue treatment for pneumonia as mentioned below.  2-shortness of breath and increased lower extremity swelling -In the setting of community-acquired pneumonia along with acute on chronic diastolic heart failure -Patient heart failure most likely triggered by component of A. fib with RVR as mentioned above. -BNP was elevated on presentation. -Continue antibiotics (now oral route), flutter valve, nebulization and as needed oxygen supplementation from pneumonia standpoint.  -cx has remained neg -Influenza by PCR was  negative -Continue low-sodium diet and follow Daily weights, strict intake and output -Continue low dose bisoprolol and adjusted dose of diltiazem as per cardiology rec's.  -Patient denies chest pain.  3-history of depression and anxiety -She denies suicidal ideation or hallucination at this time -Continue Ativan nightly. -mood stable. Reported good night sleep.   4-history of hyperlipidemia -Heart healthy diet has been encouraged -Currently no using statins.  5-history of stroke -Continue Eliquis for secondary prevention -No acute focal deficit appreciated  6-gastroesophageal reflux disease -Continue the use of Pepcid.  7-dry eye syndrome -Will continue eyedrops as needed.    8-chronic kidney disease stage III -slight increase in Cr with diuresis -GFR overall stable -will discontinue lasix. Marland Kitchen  9-moderate protein calorie malnutrition -Continue feeding supplement as recommended by dietitian service.  DVT prophylaxis: Eliquis Code Status: Full code Family Communication: husband at bedside Disposition Plan: Transfer to telemetry bed.  Stop diuresis, transition antibiotics to oral regimen and follow heart rate at adjusted diltiazem dose per cardiology service.  Monitor heart rate activity with exertion.  Consultants:   Cardiology  Procedures:   2D echo:  1. The left ventricle has mild-moderately reduced systolic function of 56-25%. The cavity size was normal. There is concentric left ventricular hypertrophy. Left ventricular diastology could not be evaluated secondary to atrial fibrillation. Elevated  left ventricular end-diastolic pressure Left ventricular diffuse hypokinesis.  2. The right ventricle has moderately reduced systolic function. The cavity was mildly enlarged. There is no increase in right ventricular wall thickness.  3. Left atrial size was mildly dilated.  4. Right atrial size was moderately dilated.  5. Moderate pleural effusion in the left lateral  region.  6. The mitral valve is myxomatous. There is mild thickening. Mitral valve regurgitation is moderate by color flow Doppler.  7. The  tricuspid valve is normal in structure.  8. The aortic valve is tricuspid Aortic valve regurgitation is mild by color flow Doppler.  9. There is dilatation of the aortic root. 10. The inferior vena cava was dilated in size with >50% respiratory variability.  Antimicrobials:  Anti-infectives (From admission, onward)   Start     Dose/Rate Route Frequency Ordered Stop   11/20/18 1000  levofloxacin (LEVAQUIN) tablet 500 mg     500 mg Oral Daily 11/19/18 1533     11/19/18 1415  levofloxacin (LEVAQUIN) IVPB 500 mg  Status:  Discontinued     500 mg 100 mL/hr over 60 Minutes Intravenous Every 24 hours 11/19/18 1126 11/19/18 1533   11/18/18 1415  levofloxacin (LEVAQUIN) IVPB 750 mg  Status:  Discontinued     750 mg 100 mL/hr over 90 Minutes Intravenous Every 24 hours 11/18/18 1403 11/19/18 1126      Subjective: Feeling better.  Denies chest pain, no nausea, no vomiting, no palpitations.  Overall reported resolution in her lower extremity swelling and denies orthopnea.  Objective: Vitals:   11/20/18 1300 11/20/18 1302 11/20/18 1400 11/20/18 1500  BP: (!) 97/35  96/77 (!) 89/70  Pulse: 78  70 (!) 106  Resp: 19  (!) 21 (!) 23  Temp:      TempSrc:      SpO2: 97% 94% 94% 96%  Weight:      Height:        Intake/Output Summary (Last 24 hours) at 11/20/2018 1552 Last data filed at 11/20/2018 1200 Gross per 24 hour  Intake 483 ml  Output 500 ml  Net -17 ml   Filed Weights   11/19/18 0500 11/19/18 0637 11/20/18 0519  Weight: 65.5 kg 66.2 kg 63.9 kg    Examination: General exam: Alert, awake, oriented x 3; denies chest pain, she is no requiring oxygen supplementation and no palpitations. Respiratory system: Scattered rhonchi, no wheezing, no crackles.  No using accessory muscles. Respiratory effort normal. Cardiovascular system: Rate controlled,  irregular rhythm.  No rubs, no gallops, soft systolic ejection murmur appreciated on exam.  No JVD.   Gastrointestinal system: Abdomen is nondistended, soft and nontender. No organomegaly or masses felt. Normal bowel sounds heard. Central nervous system: Alert and oriented. No focal neurological deficits. Extremities: No C/C/E, +pedal pulses Skin: No rashes, lesions or ulcers Psychiatry: Judgement and insight appear normal. Mood & affect appropriate.       Data Reviewed: I have personally reviewed following labs and imaging studies  CBC: Recent Labs  Lab 11/18/18 1256 11/19/18 0601 11/20/18 0605  WBC 7.4 7.1 7.2  NEUTROABS 4.5  --   --   HGB 12.8 12.7 13.0  HCT 40.5 40.2 40.1  MCV 97.8 98.5 96.6  PLT 245 241 062   Basic Metabolic Panel: Recent Labs  Lab 11/18/18 1256 11/19/18 0601 11/20/18 0605  NA 140 141 139  K 3.9 3.5 3.5  CL 112* 109 109  CO2 19* 21* 21*  GLUCOSE 119* 97 99  BUN 16 15 15   CREATININE 1.07* 1.10* 1.14*  CALCIUM 8.9 9.0 9.0   GFR: Estimated Creatinine Clearance: 33.6 mL/min (A) (by C-G formula based on SCr of 1.14 mg/dL (H)).   Liver Function Tests: Recent Labs  Lab 11/18/18 1827  AST 38  ALT 44  ALKPHOS 58  BILITOT 1.1  PROT 6.5  ALBUMIN 3.6   Cardiac Enzymes: Recent Labs  Lab 11/18/18 1256 11/18/18 1827 11/18/18 2336 11/19/18 0601  TROPONINI <0.03 <0.03 <0.03 <0.03  Thyroid Function Tests: Recent Labs    11/18/18 1827  TSH 3.484   Urine analysis:    Component Value Date/Time   COLORURINE YELLOW 08/09/2016 1721   APPEARANCEUR CLEAR 08/09/2016 1721   LABSPEC >1.046 (H) 08/09/2016 1721   PHURINE 8.0 08/09/2016 1721   GLUCOSEU NEGATIVE 08/09/2016 1721   HGBUR NEGATIVE 08/09/2016 1721   BILIRUBINUR NEGATIVE 08/09/2016 1721   KETONESUR NEGATIVE 08/09/2016 1721   PROTEINUR NEGATIVE 08/09/2016 1721   UROBILINOGEN 0.2 08/16/2008 2359   NITRITE NEGATIVE 08/09/2016 1721   LEUKOCYTESUR NEGATIVE 08/09/2016 1721    Recent  Results (from the past 240 hour(s))  Culture, blood (routine x 2) Call MD if unable to obtain prior to antibiotics being given     Status: None (Preliminary result)   Collection Time: 11/18/18  2:33 PM  Result Value Ref Range Status   Specimen Description   Final    LEFT ANTECUBITAL BOTTLES DRAWN AEROBIC AND ANAEROBIC   Special Requests Blood Culture adequate volume  Final   Culture   Final    NO GROWTH 2 DAYS Performed at [facility], [address], [place], [place] [address]    Report Status PENDING  Incomplete  Culture, blood (routine x 2) Call MD if unable to obtain prior to antibiotics being given     Status: None (Preliminary result)   Collection Time: 11/18/18  2:43 PM  Result Value Ref Range Status   Specimen Description BLOOD LEFT ARM BOTTLES DRAWN AEROBIC AND ANAEROBIC  Final   Special Requests Blood Culture adequate volume  Final   Culture   Final    NO GROWTH 2 DAYS Performed at [facility], [address], [place], [place] [address]    Report Status PENDING  Incomplete  MRSA PCR Screening     Status: None   Collection Time: 11/18/18  8:51 PM  Result Value Ref Range Status   MRSA by PCR NEGATIVE NEGATIVE Final    Comment:        The GeneXpert MRSA Assay (FDA approved for NASAL specimens only), is one component of a comprehensive MRSA colonization surveillance program. It is not intended to diagnose MRSA infection nor to guide or monitor treatment for MRSA infections. Performed at [facility], [address] [place], [place] [address]      Radiology Studies: Dg Chest 2 View  Result Date: 11/20/2018 CLINICAL DATA:  Shortness of breath. EXAM: CHEST - 2 VIEW COMPARISON:  Radiographs of November 18, 2018. FINDINGS: Stable cardiomegaly. Atherosclerosis of thoracic aorta is noted. No pneumothorax is noted. Minimal bilateral pleural effusions are noted. Right lung is clear. Stable left basilar atelectasis or infiltrate is noted. Bony thorax is unremarkable. IMPRESSION:  Stable left basilar atelectasis or infiltrate is noted. Minimal bilateral pleural effusions are noted. Aortic Atherosclerosis (ICD10-I70.0). Electronically Signed   By: Marijo Conception, M.D.   On: 11/20/2018 10:40    Scheduled Meds: . apixaban  5 mg Oral BID  . bisoprolol  2.5 mg Oral QHS  . diltiazem  30 mg Oral BID  . famotidine  20 mg Oral Daily  . feeding supplement (ENSURE ENLIVE)  237 mL Oral BID BM  . ipratropium  0.5 mg Nebulization QID  . levofloxacin  500 mg Oral Daily  . LORazepam  1 mg Oral QHS  . multivitamin with minerals  1 tablet Oral Daily  . sodium chloride flush  3 mL Intravenous Q12H   Continuous Infusions: . sodium chloride       LOS: 2  days    Time spent: 30 minutes.    Barton Dubois, MD [facility] Pager [phone]  11/20/2018, 3:52 PM

## 2018-11-21 LAB — BASIC METABOLIC PANEL
Anion gap: 8 (ref 5–15)
BUN: 17 mg/dL (ref 8–23)
CO2: 25 mmol/L (ref 22–32)
Calcium: 9.1 mg/dL (ref 8.9–10.3)
Chloride: 106 mmol/L (ref 98–111)
Creatinine, Ser: 1.34 mg/dL — ABNORMAL HIGH (ref 0.44–1.00)
GFR calc Af Amer: 42 mL/min — ABNORMAL LOW (ref >60)
GFR calc non Af Amer: 37 mL/min — ABNORMAL LOW (ref >60)
Glucose, Bld: 108 mg/dL — ABNORMAL HIGH (ref 70–99)
Potassium: 3.8 mmol/L (ref 3.5–5.1)
Sodium: 139 mmol/L (ref 135–145)

## 2018-11-21 LAB — LEGIONELLA PNEUMOPHILA SEROGP 1 UR AG: L. pneumophila Serogp 1 Ur Ag: NEGATIVE

## 2018-11-21 MED ORDER — IPRATROPIUM BROMIDE 0.02 % IN SOLN
0.5000 mg | Freq: Three times a day (TID) | RESPIRATORY_TRACT | Status: DC
  Administered 2018-11-22: 0.5 mg via RESPIRATORY_TRACT
  Filled 2018-11-21 (×2): qty 2.5

## 2018-11-21 NOTE — Progress Notes (Signed)
Pt able to walk around unit once with use of a front wheel walker. HR at rest 80. While ambulating HR reached 120's but able to maintain around 100-110. Once pt was back in room and resting in bed HR maintained around 98. No complaints of SOB or pain.

## 2018-11-21 NOTE — Progress Notes (Signed)
Progress Note  Patient Name: Carol Hardin Date of Encounter: 11/21/2018  Primary Cardiologist: Carlyle Dolly, MD   Subjective   No complaints  Inpatient Medications    Scheduled Meds: . apixaban  5 mg Oral BID  . bisoprolol  2.5 mg Oral QHS  . diltiazem  30 mg Oral BID  . famotidine  20 mg Oral Daily  . feeding supplement (ENSURE ENLIVE)  237 mL Oral BID BM  . ipratropium  0.5 mg Nebulization QID  . levofloxacin  500 mg Oral Daily  . LORazepam  1 mg Oral QHS  . multivitamin with minerals  1 tablet Oral Daily  . sodium chloride flush  3 mL Intravenous Q12H   Continuous Infusions: . sodium chloride     PRN Meds: sodium chloride, acetaminophen, levalbuterol, ondansetron (ZOFRAN) IV, polyethylene glycol, polyvinyl alcohol, sodium chloride flush   Vital Signs    Vitals:   11/21/18 0740 11/21/18 0751 11/21/18 0800 11/21/18 0900  BP:   99/71   Pulse:   (!) 53 (!) 37  Resp:   14 19  Temp: 98.4 F (36.9 C)     TempSrc:      SpO2:  94% 99% 96%  Weight:      Height:        Intake/Output Summary (Last 24 hours) at 11/21/2018 0927 Last data filed at 11/21/2018 0900 Gross per 24 hour  Intake 480 ml  Output 1350 ml  Net -870 ml   Last 3 Weights 11/21/2018 11/20/2018 11/19/2018  Weight (lbs) 143 lb 8.3 oz 140 lb 14 oz 145 lb 15.1 oz  Weight (kg) 65.1 kg 63.9 kg 66.2 kg  Some encounter information is confidential and restricted. Go to Review Flowsheets activity to see all data.      Telemetry    afib variable rates, 80s this AM - Personally Reviewed  ECG    na - Personally Reviewed  Physical Exam   GEN: No acute distress.   Neck: No JVD Cardiac: irreg Respiratory: Clear to auscultation bilaterally. GI: Soft, nontender, non-distended  MS: No edema; No deformity. Neuro:  Nonfocal  Psych: Normal affect   Labs    Chemistry Recent Labs  Lab 11/18/18 1827 11/19/18 0601 11/20/18 0605 11/21/18 0901  NA  --  141 139 139  K  --  3.5 3.5 3.8  CL  --   109 109 106  CO2  --  21* 21* 25  GLUCOSE  --  97 99 108*  BUN  --  15 15 17   CREATININE  --  1.10* 1.14* 1.34*  CALCIUM  --  9.0 9.0 9.1  PROT 6.5  --   --   --   ALBUMIN 3.6  --   --   --   AST 38  --   --   --   ALT 44  --   --   --   ALKPHOS 58  --   --   --   BILITOT 1.1  --   --   --   GFRNONAA  --  46* 44* 37*  GFRAA  --  54* 51* 42*  ANIONGAP  --  11 9 8      Hematology Recent Labs  Lab 11/18/18 1256 11/19/18 0601 11/20/18 0605  WBC 7.4 7.1 7.2  RBC 4.14 4.08 4.15  HGB 12.8 12.7 13.0  HCT 40.5 40.2 40.1  MCV 97.8 98.5 96.6  MCH 30.9 31.1 31.3  MCHC 31.6 31.6 32.4  RDW 15.3 15.1  14.9  PLT 245 241 252    Cardiac Enzymes Recent Labs  Lab 11/18/18 1256 11/18/18 1827 11/18/18 2336 11/19/18 0601  TROPONINI <0.03 <0.03 <0.03 <0.03   No results for input(s): TROPIPOC in the last 168 hours.   BNP Recent Labs  Lab 11/18/18 1256  BNP 942.0*     DDimer No results for input(s): DDIMER in the last 168 hours.   Radiology    Dg Chest 2 View  Result Date: 11/20/2018 CLINICAL DATA:  Shortness of breath. EXAM: CHEST - 2 VIEW COMPARISON:  Radiographs of November 18, 2018. FINDINGS: Stable cardiomegaly. Atherosclerosis of thoracic aorta is noted. No pneumothorax is noted. Minimal bilateral pleural effusions are noted. Right lung is clear. Stable left basilar atelectasis or infiltrate is noted. Bony thorax is unremarkable. IMPRESSION: Stable left basilar atelectasis or infiltrate is noted. Minimal bilateral pleural effusions are noted. Aortic Atherosclerosis (ICD10-I70.0). Electronically Signed   By: Marijo Conception, M.D.   On: 11/20/2018 10:40    Cardiac Studies    Patient Profile     Carol Hardin is a 85 y.o. female with a hx of tachy brady syndrome who is being seen today for the evaluation of afib with RVR at the request of Dr Barton Dubois.  Assessment & Plan    1. Afib with Tachy-brady syndrome - issues with compliance as reported above, including  refusal to take therapeutic dosing of eliquis and to stop her bisoprolol due to severe bradycardia on monitor - on bisoprolol 2.5mg  and dilt gtt for rate control. Off dilt gtt since yesterday AM and started on oral dilt, this AM afib rates 80s to 90s - exacerbation may be related to ongoing pneumonia   - now on bisoprolol 2.5mg  daily, dilt 30mg  bid. Rates elevated at times yesterday but at goal today. We are not going to be able to achieve perfect rate control given her tachy-brady.  Long term plan would still be for tikasyn loading after recovered from pneumonia - once pneumonia resolves I think her tachy drive will decrease and will need to come off dilt   2. Pneumonia - abx per primary team.   3. Acute systolic HF - LVE 28-41%, moderate RV dysfunction, moderate pleural effusion, moderaet MR - LVEF 2018 55-60%. Perhaps tachy medicated CM - I/Os incomplete. At least negatve 1.7 L. She is on lasix 20mg  IV daily, mild uptrend in Cr  - volume status appears improved, soft bp's would avoid further diuresis. Labs would suggest she is dry, perhaps contributing to low bp's - bp's too soft to consider additional CHF meds   Ok for tele bed. Would monitor rates and bp's one more day.    For questions or updates, please contact [place] Please consult [email or web address] for contact info under        Signed, Carlyle Dolly, MD  11/21/2018, 9:27 AM

## 2018-11-21 NOTE — Progress Notes (Signed)
PROGRESS NOTE    Carol Hardin  GUY:[id number] DOB: 1933-10-01 DOA: 11/18/2018 PCP: Celene Squibb, MD    Brief Narrative:  85 y.o. female with significant past medical history as mentioned below, who presented to the emergency department complaining of gradual onset worsening persistent shortness of breath for over a week now.  Patient reports chest tightness sensation associated with shortness of breath and also experiencing dry cough.  She has noticed increased swelling in her legs and also positive orthopnea.  Patient expressed intermittent episodes of heart racing sensation. She denies fevers, abdominal pain, nausea, vomiting, diarrhea, dysuria, hematuria, melena, headaches or focal weakness. No sick contacts reported. Up to date on flu and pneumonia vaccines.  Patient reports to be compliant with her medications.  In the ED patient found to be on A. fib with RVR, tachypneic with a chest x-ray demonstrating left lower lobe pneumonia, and vascular congestion   Assessment & Plan: 1-Atrial fibrillation with RVR (HCC) -Currently rate controlled after using Cardizem drip -Will continue adjusted dose of diltiazem as recommended by cardiology service. -Continue Eliquis for secondary prevention -Repeated echo demonstrated decrease in her ejection fraction to 40-45%; cardiology will be consulted. -Continue the use on low-dose bisoprolol nightly. -TSH within normal limits. -Continue treatment for pneumonia as mentioned below.  2-shortness of breath and increased lower extremity swelling -In the setting of community-acquired pneumonia along with acute on chronic diastolic heart failure -Patient heart failure most likely triggered by component of A. fib with RVR as mentioned above. -BNP was elevated on presentation. -Continue antibiotics (now oral route), flutter valve, nebulization and as needed oxygen supplementation from pneumonia standpoint.  -cx has remained neg -Influenza by PCR was  negative -Continue low-sodium diet and follow Daily weights, strict intake and output -Continue low dose bisoprolol and adjusted dose of diltiazem as per cardiology rec's.  -Patient denies chest pain.  3-history of depression and anxiety -She denies suicidal ideation or hallucination at this time -Continue Ativan nightly. -mood stable. Reported good night sleep.   4-history of hyperlipidemia -Heart healthy diet has been encouraged -Currently no using statins.  5-history of stroke -Continue Eliquis for secondary prevention -No acute focal deficit appreciated  6-gastroesophageal reflux disease -Continue the use of Pepcid.  7-dry eye syndrome -Will continue eyedrops as needed.    8-chronic kidney disease stage III -slight increase in Cr with diuresis -GFR overall stable -will discontinue lasix. Marland Kitchen  9-moderate protein calorie malnutrition -Continue feeding supplement as recommended by dietitian service.  DVT prophylaxis: Eliquis Code Status: Full code Family Communication: husband at bedside Disposition Plan: Transfer to telemetry bed.  Stop diuresis, transition antibiotics to oral regimen and follow heart rate at adjusted diltiazem dose per cardiology service.  Monitor heart rate activity with exertion.  Consultants:   Cardiology  Procedures:   2D echo:  1. The left ventricle has mild-moderately reduced systolic function of 56-38%. The cavity size was normal. There is concentric left ventricular hypertrophy. Left ventricular diastology could not be evaluated secondary to atrial fibrillation. Elevated  left ventricular end-diastolic pressure Left ventricular diffuse hypokinesis.  2. The right ventricle has moderately reduced systolic function. The cavity was mildly enlarged. There is no increase in right ventricular wall thickness.  3. Left atrial size was mildly dilated.  4. Right atrial size was moderately dilated.  5. Moderate pleural effusion in the left lateral  region.  6. The mitral valve is myxomatous. There is mild thickening. Mitral valve regurgitation is moderate by color flow Doppler.  7. The  tricuspid valve is normal in structure.  8. The aortic valve is tricuspid Aortic valve regurgitation is mild by color flow Doppler.  9. There is dilatation of the aortic root. 10. The inferior vena cava was dilated in size with >50% respiratory variability.  Antimicrobials:  Anti-infectives (From admission, onward)   Start     Dose/Rate Route Frequency Ordered Stop   11/20/18 1000  levofloxacin (LEVAQUIN) tablet 500 mg     500 mg Oral Daily 11/19/18 1533     11/19/18 1415  levofloxacin (LEVAQUIN) IVPB 500 mg  Status:  Discontinued     500 mg 100 mL/hr over 60 Minutes Intravenous Every 24 hours 11/19/18 1126 11/19/18 1533   11/18/18 1415  levofloxacin (LEVAQUIN) IVPB 750 mg  Status:  Discontinued     750 mg 100 mL/hr over 90 Minutes Intravenous Every 24 hours 11/18/18 1403 11/19/18 1126      Subjective: Feeling better.  Denies chest pain, no nausea, no vomiting, no palpitations.  Overall reported resolution in her lower extremity swelling and denies orthopnea.  Objective: Vitals:   11/21/18 1110 11/21/18 1123 11/21/18 1548 11/21/18 1608  BP:      Pulse:      Resp:      Temp: 98.4 F (36.9 C)   98.4 F (36.9 C)  TempSrc:      SpO2:  96% 95%   Weight:      Height:        Intake/Output Summary (Last 24 hours) at 11/21/2018 1736 Last data filed at 11/21/2018 1626 Gross per 24 hour  Intake 600 ml  Output 400 ml  Net 200 ml   Filed Weights   11/19/18 0637 11/20/18 0519 11/21/18 0500  Weight: 66.2 kg 63.9 kg 65.1 kg    Examination: General exam: Alert, awake, oriented x 3; denies chest pain, she is no requiring oxygen supplementation and no palpitations. Respiratory system: Scattered rhonchi, no wheezing, no crackles.  No using accessory muscles. Respiratory effort normal. Cardiovascular system: Rate controlled, irregular rhythm.  No  rubs, no gallops, soft systolic ejection murmur appreciated on exam.  No JVD.   Gastrointestinal system: Abdomen is nondistended, soft and nontender. No organomegaly or masses felt. Normal bowel sounds heard. Central nervous system: Alert and oriented. No focal neurological deficits. Extremities: No C/C/E, +pedal pulses Skin: No rashes, lesions or ulcers Psychiatry: Judgement and insight appear normal. Mood & affect appropriate.       Data Reviewed: I have personally reviewed following labs and imaging studies  CBC: Recent Labs  Lab 11/18/18 1256 11/19/18 0601 11/20/18 0605  WBC 7.4 7.1 7.2  NEUTROABS 4.5  --   --   HGB 12.8 12.7 13.0  HCT 40.5 40.2 40.1  MCV 97.8 98.5 96.6  PLT 245 241 096   Basic Metabolic Panel: Recent Labs  Lab 11/18/18 1256 11/19/18 0601 11/20/18 0605 11/21/18 0901  NA 140 141 139 139  K 3.9 3.5 3.5 3.8  CL 112* 109 109 106  CO2 19* 21* 21* 25  GLUCOSE 119* 97 99 108*  BUN 16 15 15 17   CREATININE 1.07* 1.10* 1.14* 1.34*  CALCIUM 8.9 9.0 9.0 9.1   GFR: Estimated Creatinine Clearance: 28.6 mL/min (A) (by C-G formula based on SCr of 1.34 mg/dL (H)).   Liver Function Tests: Recent Labs  Lab 11/18/18 1827  AST 38  ALT 44  ALKPHOS 58  BILITOT 1.1  PROT 6.5  ALBUMIN 3.6   Cardiac Enzymes: Recent Labs  Lab 11/18/18 1256 11/18/18 1827  11/18/18 2336 11/19/18 0601  TROPONINI <0.03 <0.03 <0.03 <0.03   Thyroid Function Tests: Recent Labs    11/18/18 1827  TSH 3.484   Urine analysis:    Component Value Date/Time   COLORURINE YELLOW 08/09/2016 1721   APPEARANCEUR CLEAR 08/09/2016 1721   LABSPEC >1.046 (H) 08/09/2016 1721   PHURINE 8.0 08/09/2016 1721   GLUCOSEU NEGATIVE 08/09/2016 1721   HGBUR NEGATIVE 08/09/2016 1721   BILIRUBINUR NEGATIVE 08/09/2016 1721   KETONESUR NEGATIVE 08/09/2016 1721   PROTEINUR NEGATIVE 08/09/2016 1721   UROBILINOGEN 0.2 08/16/2008 2359   NITRITE NEGATIVE 08/09/2016 1721   LEUKOCYTESUR NEGATIVE  08/09/2016 1721    Recent Results (from the past 240 hour(s))  Culture, blood (routine x 2) Call MD if unable to obtain prior to antibiotics being given     Status: None (Preliminary result)   Collection Time: 11/18/18  2:33 PM  Result Value Ref Range Status   Specimen Description   Final    LEFT ANTECUBITAL BOTTLES DRAWN AEROBIC AND ANAEROBIC   Special Requests Blood Culture adequate volume  Final   Culture   Final    NO GROWTH 3 DAYS Performed at [facility], [address], [place], [place] [address]    Report Status PENDING  Incomplete  Culture, blood (routine x 2) Call MD if unable to obtain prior to antibiotics being given     Status: None (Preliminary result)   Collection Time: 11/18/18  2:43 PM  Result Value Ref Range Status   Specimen Description BLOOD LEFT ARM BOTTLES DRAWN AEROBIC AND ANAEROBIC  Final   Special Requests Blood Culture adequate volume  Final   Culture   Final    NO GROWTH 3 DAYS Performed at [facility], [address], [place], [place] [address]    Report Status PENDING  Incomplete  MRSA PCR Screening     Status: None   Collection Time: 11/18/18  8:51 PM  Result Value Ref Range Status   MRSA by PCR NEGATIVE NEGATIVE Final    Comment:        The GeneXpert MRSA Assay (FDA approved for NASAL specimens only), is one component of a comprehensive MRSA colonization surveillance program. It is not intended to diagnose MRSA infection nor to guide or monitor treatment for MRSA infections. Performed at [facility], [address], [place],  [address]      Radiology Studies: Dg Chest 2 View  Result Date: 11/20/2018 CLINICAL DATA:  Shortness of breath. EXAM: CHEST - 2 VIEW COMPARISON:  Radiographs of November 18, 2018. FINDINGS: Stable cardiomegaly. Atherosclerosis of thoracic aorta is noted. No pneumothorax is noted. Minimal bilateral pleural effusions are noted. Right lung is clear. Stable left basilar atelectasis or infiltrate is noted. Bony thorax  is unremarkable. IMPRESSION: Stable left basilar atelectasis or infiltrate is noted. Minimal bilateral pleural effusions are noted. Aortic Atherosclerosis (ICD10-I70.0). Electronically Signed   By: Marijo Conception, M.D.   On: 11/20/2018 10:40    Scheduled Meds: . apixaban  5 mg Oral BID  . bisoprolol  2.5 mg Oral QHS  . diltiazem  30 mg Oral BID  . famotidine  20 mg Oral Daily  . feeding supplement (ENSURE ENLIVE)  237 mL Oral BID BM  . ipratropium  0.5 mg Nebulization TID  . levofloxacin  500 mg Oral Daily  . LORazepam  1 mg Oral QHS  . multivitamin with minerals  1 tablet Oral Daily  . sodium chloride flush  3 mL Intravenous Q12H   Continuous  Infusions: . sodium chloride       LOS: 3 days    Time spent: 30 minutes.    Barton Dubois, MD [facility] Pager [phone]  11/21/2018, 5:36 PM    PROGRESS NOTE    Carol Hardin  FEO:[id number] DOB: Jun 05, 1933 DOA: 11/18/2018 PCP: Celene Squibb, MD    Brief Narrative:  85 y.o. female with significant past medical history as mentioned below, who presented to the emergency department complaining of gradual onset worsening persistent shortness of breath for over a week now.  Patient reports chest tightness sensation associated with shortness of breath and also experiencing dry cough.  She has noticed increased swelling in her legs and also positive orthopnea.  Patient expressed intermittent episodes of heart racing sensation. She denies fevers, abdominal pain, nausea, vomiting, diarrhea, dysuria, hematuria, melena, headaches or focal weakness. No sick contacts reported. Up to date on flu and pneumonia vaccines.  Patient reports to be compliant with her medications.  In the ED patient found to be on A. fib with RVR, tachypneic with a chest x-ray demonstrating left lower lobe pneumonia, and vascular congestion   Assessment & Plan: 1-Atrial fibrillation with RVR (HCC) -Currently rate controlled after using Cardizem  drip -Will continue adjusted dose of diltiazem as recommended by cardiology service. -Increase physical activity and assess heart rate response -Continue Eliquis for secondary prevention -Repeated echo demonstrated decrease in her ejection fraction to 40-45%; cardiology on board, will follow further recommendations.. -Continue the use of low-dose bisoprolol nightly. -TSH within normal limits. -Continue treatment for pneumonia as mentioned below.  2-shortness of breath and increased lower extremity swelling -In the setting of community-acquired pneumonia along with acute on chronic diastolic heart failure -Patient heart failure most likely triggered by component of A. fib with RVR as mentioned above. -BNP was elevated on presentation. -Continue antibiotics (using Levaquin now oral route), flutter valve, nebulization and as needed oxygen supplementation from pneumonia standpoint.  -Cultures has remained neg -Influenza by PCR was negative -Continue low-sodium diet  -Continue to follow daily weights, strict intake and output -Continue low dose bisoprolol and adjusted dose of diltiazem as per cardiology rec's.  -Patient denies chest pain and palpitations currently..  3-history of depression and anxiety -She denies suicidal ideation or hallucination at this time -Continue Ativan nightly. -mood stable.   4-history of hyperlipidemia -Heart healthy diet has been encouraged -Currently no using statins.  5-history of stroke -Continue Eliquis for secondary prevention -No acute focal deficit appreciated  6-gastroesophageal reflux disease -Continue the use of Pepcid.  7-dry eye syndrome -Will continue eyedrops as needed.    8-chronic kidney disease stage III -slight increase in Cr with diuresis -GFR overall stable -Lasix discontinue on 11/20/2018  9-moderate protein calorie malnutrition -Continue feeding supplement as recommended by dietitian service.  DVT prophylaxis: Eliquis Code  Status: Full code Family Communication: husband at bedside Disposition Plan: Continue monitoring for another 24 hours on telemetry; continue increasing physical activity while watching heart rate response.  Cardiology on board and will follow further recommendations.  Complete treatment for pneumonia using Levaquin by mouth.    Consultants:   Cardiology  Procedures:   2D echo:  1. The left ventricle has mild-moderately reduced systolic function of 32-54%. The cavity size was normal. There is concentric left ventricular hypertrophy. Left ventricular diastology could not be evaluated secondary to atrial fibrillation. Elevated  left ventricular end-diastolic pressure Left ventricular diffuse hypokinesis.  2. The right ventricle has moderately reduced systolic function. The cavity was mildly enlarged.  There is no increase in right ventricular wall thickness.  3. Left atrial size was mildly dilated.  4. Right atrial size was moderately dilated.  5. Moderate pleural effusion in the left lateral region.  6. The mitral valve is myxomatous. There is mild thickening. Mitral valve regurgitation is moderate by color flow Doppler.  7. The tricuspid valve is normal in structure.  8. The aortic valve is tricuspid Aortic valve regurgitation is mild by color flow Doppler.  9. There is dilatation of the aortic root. 10. The inferior vena cava was dilated in size with >50% respiratory variability.  Antimicrobials:  Anti-infectives (From admission, onward)   Start     Dose/Rate Route Frequency Ordered Stop   11/20/18 1000  levofloxacin (LEVAQUIN) tablet 500 mg     500 mg Oral Daily 11/19/18 1533     11/19/18 1415  levofloxacin (LEVAQUIN) IVPB 500 mg  Status:  Discontinued     500 mg 100 mL/hr over 60 Minutes Intravenous Every 24 hours 11/19/18 1126 11/19/18 1533   11/18/18 1415  levofloxacin (LEVAQUIN) IVPB 750 mg  Status:  Discontinued     750 mg 100 mL/hr over 90 Minutes Intravenous Every 24 hours  11/18/18 1403 11/19/18 1126      Subjective: Overall feeling better.  Denies chest pain, nausea, vomiting and palpitations.  Patient with overnight episode of tachyarrhythmia (transient and resolved after digoxin given).  Tolerating diet and antibiotics by mouth.  No requiring oxygen supplementation.  Patient is afebrile.  Objective: Vitals:   11/21/18 1110 11/21/18 1123 11/21/18 1548 11/21/18 1608  BP:      Pulse:      Resp:      Temp: 98.4 F (36.9 C)   98.4 F (36.9 C)  TempSrc:      SpO2:  96% 95%   Weight:      Height:        Intake/Output Summary (Last 24 hours) at 11/21/2018 1736 Last data filed at 11/21/2018 1626 Gross per 24 hour  Intake 600 ml  Output 400 ml  Net 200 ml   Filed Weights   11/19/18 0637 11/20/18 0519 11/21/18 0500  Weight: 66.2 kg 63.9 kg 65.1 kg    Examination: General exam: Alert, awake, oriented x 3; denies chest pain, nausea, vomiting and palpitations.  Patient overnight with transient episode of uncontrolled tachyarrhythmia. Respiratory system: Positive scattered rhonchi, no crackles, good air movement bilaterally, no wheezing, no using accessory muscles, normal respiratory effort and no requiring oxygen supplementation.   Cardiovascular system: Currently rate controlled, irregular rhythm, positive soft murmur, no rubs, no gallops, no JVD.  Gastrointestinal system: Abdomen is nondistended, soft and nontender. No organomegaly or masses felt. Normal bowel sounds heard. Central nervous system: Alert and oriented. No focal neurological deficits. Extremities: No C/C/E, +pedal pulses Skin: No rashes, lesions or ulcers Psychiatry: Judgement and insight appear normal. Mood & affect appropriate.   Data Reviewed: I have personally reviewed following labs and imaging studies  CBC: Recent Labs  Lab 11/18/18 1256 11/19/18 0601 11/20/18 0605  WBC 7.4 7.1 7.2  NEUTROABS 4.5  --   --   HGB 12.8 12.7 13.0  HCT 40.5 40.2 40.1  MCV 97.8 98.5 96.6  PLT  245 241 035   Basic Metabolic Panel: Recent Labs  Lab 11/18/18 1256 11/19/18 0601 11/20/18 0605 11/21/18 0901  NA 140 141 139 139  K 3.9 3.5 3.5 3.8  CL 112* 109 109 106  CO2 19* 21* 21* 25  GLUCOSE 119* 97  99 108*  BUN 16 15 15 17   CREATININE 1.07* 1.10* 1.14* 1.34*  CALCIUM 8.9 9.0 9.0 9.1   GFR: Estimated Creatinine Clearance: 28.6 mL/min (A) (by C-G formula based on SCr of 1.34 mg/dL (H)).   Liver Function Tests: Recent Labs  Lab 11/18/18 1827  AST 38  ALT 44  ALKPHOS 58  BILITOT 1.1  PROT 6.5  ALBUMIN 3.6   Cardiac Enzymes: Recent Labs  Lab 11/18/18 1256 11/18/18 1827 11/18/18 2336 11/19/18 0601  TROPONINI <0.03 <0.03 <0.03 <0.03   Thyroid Function Tests: Recent Labs    11/18/18 1827  TSH 3.484   Urine analysis:    Component Value Date/Time   COLORURINE YELLOW 08/09/2016 1721   APPEARANCEUR CLEAR 08/09/2016 1721   LABSPEC >1.046 (H) 08/09/2016 1721   PHURINE 8.0 08/09/2016 1721   GLUCOSEU NEGATIVE 08/09/2016 1721   HGBUR NEGATIVE 08/09/2016 1721   BILIRUBINUR NEGATIVE 08/09/2016 1721   KETONESUR NEGATIVE 08/09/2016 1721   PROTEINUR NEGATIVE 08/09/2016 1721   UROBILINOGEN 0.2 08/16/2008 2359   NITRITE NEGATIVE 08/09/2016 1721   LEUKOCYTESUR NEGATIVE 08/09/2016 1721    Recent Results (from the past 240 hour(s))  Culture, blood (routine x 2) Call MD if unable to obtain prior to antibiotics being given     Status: None (Preliminary result)   Collection Time: 11/18/18  2:33 PM  Result Value Ref Range Status   Specimen Description   Final    LEFT ANTECUBITAL BOTTLES DRAWN AEROBIC AND ANAEROBIC   Special Requests Blood Culture adequate volume  Final   Culture   Final    NO GROWTH 3 DAYS Performed at [facility], [address], [place], [place] [address]    Report Status PENDING  Incomplete  Culture, blood (routine x 2) Call MD if unable to obtain prior to antibiotics being given     Status: None (Preliminary result)   Collection Time:  11/18/18  2:43 PM  Result Value Ref Range Status   Specimen Description BLOOD LEFT ARM BOTTLES DRAWN AEROBIC AND ANAEROBIC  Final   Special Requests Blood Culture adequate volume  Final   Culture   Final    NO GROWTH 3 DAYS Performed at [facility], [address], [place], [place] [address]    Report Status PENDING  Incomplete  MRSA PCR Screening     Status: None   Collection Time: 11/18/18  8:51 PM  Result Value Ref Range Status   MRSA by PCR NEGATIVE NEGATIVE Final    Comment:        The GeneXpert MRSA Assay (FDA approved for NASAL specimens only), is one component of a comprehensive MRSA colonization surveillance program. It is not intended to diagnose MRSA infection nor to guide or monitor treatment for MRSA infections. Performed at [facility], [address] [place], [place] [address]      Radiology Studies: Dg Chest 2 View  Result Date: 11/20/2018 CLINICAL DATA:  Shortness of breath. EXAM: CHEST - 2 VIEW COMPARISON:  Radiographs of November 18, 2018. FINDINGS: Stable cardiomegaly. Atherosclerosis of thoracic aorta is noted. No pneumothorax is noted. Minimal bilateral pleural effusions are noted. Right lung is clear. Stable left basilar atelectasis or infiltrate is noted. Bony thorax is unremarkable. IMPRESSION: Stable left basilar atelectasis or infiltrate is noted. Minimal bilateral pleural effusions are noted. Aortic Atherosclerosis (ICD10-I70.0). Electronically Signed   By: Marijo Conception, M.D.   On: 11/20/2018 10:40    Scheduled Meds: . apixaban  5 mg Oral BID  . bisoprolol  2.5 mg Oral QHS  . diltiazem  30 mg Oral BID  . famotidine  20 mg Oral Daily  . feeding supplement (ENSURE ENLIVE)  237 mL Oral BID BM  . ipratropium  0.5 mg Nebulization TID  . levofloxacin  500 mg Oral Daily  . LORazepam  1 mg Oral QHS  . multivitamin with minerals  1 tablet Oral Daily  . sodium chloride flush  3 mL Intravenous Q12H   Continuous Infusions: . sodium chloride        LOS: 3 days    Time spent: 30 minutes.    Barton Dubois, MD [facility] Pager [phone]  11/21/2018, 5:36 PM

## 2018-11-22 MED ORDER — DILTIAZEM HCL 30 MG PO TABS: 30.0000 mg | ORAL_TABLET | Freq: Two times a day (BID) | ORAL | Status: DC | PRN

## 2018-11-22 MED ORDER — LEVOFLOXACIN 500 MG PO TABS: 500.0000 mg | ORAL_TABLET | Freq: Every day | ORAL | 0 refills | Status: AC

## 2018-11-22 MED ORDER — ENSURE ENLIVE PO LIQD: 237.0000 mL | Freq: Two times a day (BID) | ORAL | 12 refills | Status: DC

## 2018-11-22 MED ORDER — DILTIAZEM HCL 30 MG PO TABS: 30.0000 mg | ORAL_TABLET | Freq: Two times a day (BID) | ORAL | 2 refills | Status: DC | PRN

## 2018-11-22 NOTE — Progress Notes (Signed)
Progress Note  Patient Name: Carol Hardin Date of Encounter: 11/22/2018  Primary Cardiologist: Carlyle Dolly, MD   Subjective   No complaints  Inpatient Medications    Scheduled Meds: . apixaban  5 mg Oral BID  . bisoprolol  2.5 mg Oral QHS  . diltiazem  30 mg Oral BID  . famotidine  20 mg Oral Daily  . feeding supplement (ENSURE ENLIVE)  237 mL Oral BID BM  . ipratropium  0.5 mg Nebulization TID  . levofloxacin  500 mg Oral Daily  . LORazepam  1 mg Oral QHS  . multivitamin with minerals  1 tablet Oral Daily  . sodium chloride flush  3 mL Intravenous Q12H   Continuous Infusions: . sodium chloride     PRN Meds: sodium chloride, acetaminophen, levalbuterol, ondansetron (ZOFRAN) IV, polyethylene glycol, polyvinyl alcohol, sodium chloride flush   Vital Signs    Vitals:   11/22/18 0600 11/22/18 0700 11/22/18 0701 11/22/18 0813  BP: (!) 85/67 95/67    Pulse: (!) 45 (!) 45 61   Resp: 18 19 14    Temp: 98 F (36.7 C)     TempSrc:      SpO2: 96% 90% 90% 96%  Weight:      Height:        Intake/Output Summary (Last 24 hours) at 11/22/2018 2637 Last data filed at 11/21/2018 2100 Gross per 24 hour  Intake 360 ml  Output 300 ml  Net 60 ml   Last 3 Weights 11/22/2018 11/21/2018 11/20/2018  Weight (lbs) 138 lb 3.7 oz 143 lb 8.3 oz 140 lb 14 oz  Weight (kg) 62.7 kg 65.1 kg 63.9 kg  Some encounter information is confidential and restricted. Go to Review Flowsheets activity to see all data.      Telemetry    Rate controlled afib - Personally Reviewed  ECG    na  Physical Exam   GEN: No acute distress.   Neck: No JVD Cardiac: irreg Respiratory: Clear to auscultation bilaterally. GI: Soft, nontender, non-distended  MS: No edema; No deformity. Neuro:  Nonfocal  Psych: Normal affect   Labs    Chemistry Recent Labs  Lab 11/18/18 1827 11/19/18 0601 11/20/18 0605 11/21/18 0901  NA  --  141 139 139  K  --  3.5 3.5 3.8  CL  --  109 109 106  CO2  --   21* 21* 25  GLUCOSE  --  97 99 108*  BUN  --  15 15 17   CREATININE  --  1.10* 1.14* 1.34*  CALCIUM  --  9.0 9.0 9.1  PROT 6.5  --   --   --   ALBUMIN 3.6  --   --   --   AST 38  --   --   --   ALT 44  --   --   --   ALKPHOS 58  --   --   --   BILITOT 1.1  --   --   --   GFRNONAA  --  46* 44* 37*  GFRAA  --  54* 51* 42*  ANIONGAP  --  11 9 8      Hematology Recent Labs  Lab 11/18/18 1256 11/19/18 0601 11/20/18 0605  WBC 7.4 7.1 7.2  RBC 4.14 4.08 4.15  HGB 12.8 12.7 13.0  HCT 40.5 40.2 40.1  MCV 97.8 98.5 96.6  MCH 30.9 31.1 31.3  MCHC 31.6 31.6 32.4  RDW 15.3 15.1 14.9  PLT 245 241  252    Cardiac Enzymes Recent Labs  Lab 11/18/18 1256 11/18/18 1827 11/18/18 2336 11/19/18 0601  TROPONINI <0.03 <0.03 <0.03 <0.03   No results for input(s): TROPIPOC in the last 168 hours.   BNP Recent Labs  Lab 11/18/18 1256  BNP 942.0*     DDimer No results for input(s): DDIMER in the last 168 hours.   Radiology    Dg Chest 2 View  Result Date: 11/20/2018 CLINICAL DATA:  Shortness of breath. EXAM: CHEST - 2 VIEW COMPARISON:  Radiographs of November 18, 2018. FINDINGS: Stable cardiomegaly. Atherosclerosis of thoracic aorta is noted. No pneumothorax is noted. Minimal bilateral pleural effusions are noted. Right lung is clear. Stable left basilar atelectasis or infiltrate is noted. Bony thorax is unremarkable. IMPRESSION: Stable left basilar atelectasis or infiltrate is noted. Minimal bilateral pleural effusions are noted. Aortic Atherosclerosis (ICD10-I70.0). Electronically Signed   By: Marijo Conception, M.D.   On: 11/20/2018 10:40    Cardiac Studies    Patient Profile      Carol Wix Browningis a 85 y.o.femalewith a hx of tachy brady syndromewho is being seen today for the evaluation of afib with RVRat the request ofDr Barton Dubois.  Assessment & Plan    1. Afib with Tachy-brady syndrome - issues with compliance as reported above, including refusal to take  therapeutic dosing of eliquis and to stop her bisoprolol due to severe bradycardia on monitor - on bisoprolol 2.5mg  and dilt gtt for rate control. Off dilt gtt since yesterday AM and started on oral dilt, this AM afib rates 80s to 90s - exacerbation may be related to ongoing pneumonia   - now on bisoprolol 2.5mg  daily, dilt 30mg  bid. - rates controlled. Soft bp's at times. Now that pneumonia resolving drive for tachcyardia is resolving - would change dilt to 30mg  po q 12hrs prn only. Continue bisoprolol    2. Pneumonia - abx per primary team.   3. Acute systolic HF - LVE 85-46%, moderate RV dysfunction, moderate pleural effusion, moderaet MR - LVEF 2018 55-60%. Perhaps tachy medicated CM - I/Os incomplete. At least negatve 1.7 L. She is on lasix 20mg  IV daily, mild uptrend in Cr  - volume status appears improved, soft bp's would avoid further diuresis. Labs would suggest she is dry, perhaps contributing to low bp's - bp's too soft to consider additional CHF meds    Ok for discharge, we will arrange outpatient f/u    For questions or updates, please contact [place] Please consult [email or web address] for contact info under        Signed, Carlyle Dolly, MD  11/22/2018, 8:22 AM

## 2018-11-22 NOTE — Discharge Summary (Signed)
Physician Discharge Summary  Carol Hardin XBD:[id number] DOB: Jan 02, 1933 DOA: 11/18/2018  PCP: Celene Squibb, MD  Admit date: 11/18/2018  Discharge date: 11/22/2018  Admitted From:Home  Disposition:  Home  Recommendations for Outpatient Follow-up:  1. Follow up with PCP in 1-2 weeks 2. Follow-up with cardiologist appointment as set up 3. Continue on bisoprolol as well as Cardizem as needed for palpitations 4. Continue on twice daily Eliquis 5. Continue Levaquin for 2 more days to finish course of treatment  Home Health: None  Equipment/Devices: None  Discharge Condition: Stable  CODE STATUS: Full  Diet recommendation: Heart Healthy  Brief/Interim Summary: Per HPI: 85 y.o.femalewith significant past medical history as mentioned below,who presented to the emergency department complaining of gradual onset worsening persistent shortness of breath for over a week now. Patient reports chest tightness sensation associated with shortness of breath and also experiencing dry cough. She has noticed increased swelling in her legs and also positive orthopnea. Patient expressed intermittent episodes of heart racing sensation. She denies fevers, abdominal pain, nausea, vomiting, diarrhea, dysuria, hematuria, melena, headaches or focal weakness.No sick contacts reported. Up to date on flu and pneumonia vaccines.  Patient reports to be compliant with her medications.  In the ED patient found to be on A. fib with RVR, tachypneicwith a chest x-ray demonstrating left lower lobe pneumonia, and vascular congestion.  She was started on Levaquin for her pneumonia and was seen by cardiology on account of tachybradycardia syndrome for which she was started on bisoprolol as well as oral Cardizem.  She was noted to have some soft blood pressure readings and now has stable heart rate between 60 to 80 bpm.  She is otherwise asymptomatic with no shortness of breath or chest pain noted at this time.   She has been recommended to remain on bisoprolol as well as home full dose Eliquis and will now require Cardizem every 12 hours as needed for palpitations.  She has follow-up set up with cardiology on 2/21.  No other acute events noted during this course of admission.  Discharge Diagnoses:  Principal Problem:   Atrial fibrillation with rapid ventricular response (HCC) Active Problems:   DYSPNEA   History of depression   Generalized anxiety disorder   Mixed hyperlipidemia   Stroke (cerebrum) (HCC)   GERD (gastroesophageal reflux disease)   CAP (community acquired pneumonia)   Acute on chronic diastolic HF (heart failure) (HCC)   Malnutrition of moderate degree  Principal discharge diagnosis: Atrial fibrillation with tachybradycardia syndrome in the setting of pneumonia as well as acute systolic heart failure.  Discharge Instructions  Discharge Instructions    Diet - low sodium heart healthy   Complete by:  As directed    Increase activity slowly   Complete by:  As directed      Allergies as of 11/22/2018      Reactions   Omeprazole Other (See Comments)   siezure   Flagyl [metronidazole] Nausea And Vomiting   Aripiprazole Other (See Comments)   Unknown reaction   Hylan G-f 20 Other (See Comments)   Unknown reaction   Paroxetine Hcl Other (See Comments)   Headaches (a long time ago- pt doesn't really remember)   Statins Other (See Comments)   No specific reaction given-patient states that she was advised not to take by physician   Sulfa Antibiotics Diarrhea, Other (See Comments)   Colitis   Toprol Xl [metoprolol Succinate] Other (See Comments)   Dizziness--pt doesn't really remember    Vancomycin Itching,  Other (See Comments)   Pt reports that they gave it too fast- she started having itching in the scalp   Penicillins Rash   DID THE REACTION INVOLVE: Swelling of the face/tongue/throat, SOB, or low BP? No Sudden or severe rash/hives, skin peeling, or the inside of the  mouth or nose? Yes Did it require medical treatment? Unknown When did it last happen?Over 10 years If all above answers are "NO", may proceed with cephalosporin use.      Medication List    TAKE these medications   ammonium lactate 12 % lotion Commonly known as:  LAC-HYDRIN Apply 1 application topically 2 (two) times daily as needed for dry skin.   apixaban 5 MG Tabs tablet Commonly known as:  ELIQUIS Take 1 tablet (5 mg total) by mouth 2 (two) times daily.   bisoprolol 5 MG tablet Commonly known as:  ZEBETA Take 2.5 mg by mouth at bedtime.   diltiazem 30 MG tablet Commonly known as:  CARDIZEM Take 1 tablet (30 mg total) by mouth every 12 (twelve) hours as needed (palpitations).   feeding supplement (ENSURE ENLIVE) Liqd Take 237 mLs by mouth 2 (two) times daily between meals.   HM DRY EYE RELIEF 0.2-0.2-1 % Soln Generic drug:  Glycerin-Hypromellose-PEG 400 Apply 1-2 drops to eye daily as needed (for dry eye relief).   levofloxacin 500 MG tablet Commonly known as:  LEVAQUIN Take 1 tablet (500 mg total) by mouth daily for 2 days.   LORazepam 1 MG tablet Commonly known as:  ATIVAN Take 1 tablet (1 mg total) at bedtime by mouth. 1  qhs  Half  qam   MEDERMA AG HAND & BODY Lotn Apply 1 application topically daily as needed.   polyethylene glycol packet Commonly known as:  MIRALAX / GLYCOLAX Take 17 g by mouth daily as needed for mild constipation.      Follow-up Information    Fenton, Clint R, PA Follow up on 12/01/2018.   Specialty:  Cardiology Why:  Follow-Up with [facility] on 12/01/2018 at 11:30. Parking Code is 4854. Please call prior to appointment in regards to parking information.  Contact information: [place] [address] [phone]        Celene Squibb, MD Follow up in 1 week(s).   Specialty:  Internal Medicine Contact information: [place] [facility] [address] [phone]        Arnoldo Lenis, MD .   Specialty:  Cardiology Contact information: [place] [address] [phone]          Allergies  Allergen Reactions  . Omeprazole Other (See Comments)    siezure  . Flagyl [Metronidazole] Nausea And Vomiting  . Aripiprazole Other (See Comments)    Unknown reaction  . Hylan G-F 20 Other (See Comments)    Unknown reaction  . Paroxetine Hcl Other (See Comments)    Headaches (a long time ago- pt doesn't really remember)  . Statins Other (See Comments)    No specific reaction given-patient states that she was advised not to take by physician  . Sulfa Antibiotics Diarrhea and Other (See Comments)    Colitis   . Toprol Xl [Metoprolol Succinate] Other (See Comments)    Dizziness--pt doesn't really remember   . Vancomycin Itching and Other (See Comments)    Pt reports that they gave it too fast- she started having itching in the scalp  . Penicillins Rash  DID THE REACTION INVOLVE: Swelling of the face/tongue/throat, SOB, or low BP? No Sudden or severe rash/hives, skin peeling, or the inside of the mouth or nose? Yes Did it require medical treatment? Unknown When did it last happen?Over 10 years If all above answers are "NO", may proceed with cephalosporin use.     Consultations:  Cardiology   Procedures/Studies: Dg Chest 2 View  Result Date: 11/20/2018 CLINICAL DATA:  Shortness of breath. EXAM: CHEST - 2 VIEW COMPARISON:  Radiographs of November 18, 2018. FINDINGS: Stable cardiomegaly. Atherosclerosis of thoracic aorta is noted. No pneumothorax is noted. Minimal bilateral pleural effusions are noted. Right lung is clear. Stable left basilar atelectasis or infiltrate is noted. Bony thorax is unremarkable. IMPRESSION: Stable left basilar atelectasis or infiltrate is noted. Minimal bilateral pleural effusions are noted. Aortic Atherosclerosis (ICD10-I70.0). Electronically Signed   By: Marijo Conception, M.D.   On: 11/20/2018 10:40    Dg Chest 2 View  Result Date: 11/18/2018 CLINICAL DATA:  85 year old female with history of chest tightness and shortness of breath for 1 week, progressively worsening. EXAM: CHEST - 2 VIEW COMPARISON:  Chest x-ray 10/18/2018. FINDINGS: Ill-defined airspace consolidation in the medial aspect of the left lower lobe, concerning for developing bronchopneumonia. Small left pleural effusion. Right lung is clear. No evidence of pulmonary edema. Heart size is mildly enlarged. Upper mediastinal contours are within normal limits. Aortic atherosclerosis. IMPRESSION: 1. Findings are concerning for left lower lobe pneumonia with small left parapneumonic pleural effusion. 2. Mild cardiomegaly. 3. Aortic atherosclerosis. Electronically Signed   By: Vinnie Langton M.D.   On: 11/18/2018 13:52    Discharge Exam: Vitals:   11/22/18 0900 11/22/18 1000  BP: (!) 88/65 (!) 89/70  Pulse: 73 88  Resp: (!) 23 17  Temp:    SpO2: 96% 96%   Vitals:   11/22/18 0800 11/22/18 0813 11/22/18 0900 11/22/18 1000  BP: (!) 81/62  (!) 88/65 (!) 89/70  Pulse: 73  73 88  Resp: 16  (!) 23 17  Temp:      TempSrc:      SpO2: 92% 96% 96% 96%  Weight:      Height:        General: Pt is alert, awake, not in acute distress Cardiovascular: Mildly irregular, S1/S2 +, no rubs, no gallops Respiratory: CTA bilaterally, no wheezing, no rhonchi Abdominal: Soft, NT, ND, bowel sounds + Extremities: no edema, no cyanosis    The results of significant diagnostics from this hospitalization (including imaging, microbiology, ancillary and laboratory) are listed below for reference.     Microbiology: Recent Results (from the past 240 hour(s))  Culture, blood (routine x 2) Call MD if unable to obtain prior to antibiotics being given     Status: None (Preliminary result)   Collection Time: 11/18/18  2:33 PM  Result Value Ref Range Status   Specimen Description   Final    LEFT ANTECUBITAL BOTTLES DRAWN AEROBIC AND ANAEROBIC    Special Requests Blood Culture adequate volume  Final   Culture   Final    NO GROWTH 4 DAYS Performed at [facility], [address] [place], [place] [address]    Report Status PENDING  Incomplete  Culture, blood (routine x 2) Call MD if unable to obtain prior to antibiotics being given     Status: None (Preliminary result)   Collection Time: 11/18/18  2:43 PM  Result Value Ref Range Status   Specimen Description BLOOD LEFT ARM BOTTLES DRAWN AEROBIC AND ANAEROBIC  Final   Special Requests Blood Culture adequate volume  Final   Culture   Final    NO GROWTH 4 DAYS Performed at [facility], [address], [place], [place] [address]    Report Status PENDING  Incomplete  MRSA PCR Screening     Status: None   Collection Time: 11/18/18  8:51 PM  Result Value Ref Range Status   MRSA by PCR NEGATIVE NEGATIVE Final    Comment:        The GeneXpert MRSA Assay (FDA approved for NASAL specimens only), is one component of a comprehensive MRSA colonization surveillance program. It is not intended to diagnose MRSA infection nor to guide or monitor treatment for MRSA infections. Performed at [facility], [address], [place], [place] [address]      Labs: BNP (last 3 results) Recent Labs    11/18/18 1256  BNP 443.1*   Basic Metabolic Panel: Recent Labs  Lab 11/18/18 1256 11/19/18 0601 11/20/18 0605 11/21/18 0901  NA 140 141 139 139  K 3.9 3.5 3.5 3.8  CL 112* 109 109 106  CO2 19* 21* 21* 25  GLUCOSE 119* 97 99 108*  BUN 16 15 15 17   CREATININE 1.07* 1.10* 1.14* 1.34*  CALCIUM 8.9 9.0 9.0 9.1   Liver Function Tests: Recent Labs  Lab 11/18/18 1827  AST 38  ALT 44  ALKPHOS 58  BILITOT 1.1  PROT 6.5  ALBUMIN 3.6   No results for input(s): LIPASE, AMYLASE in the last 168 hours. No results for input(s): AMMONIA in the last 168 hours. CBC: Recent Labs  Lab 11/18/18 1256 11/19/18 0601 11/20/18 0605  WBC 7.4 7.1 7.2  NEUTROABS 4.5  --   --   HGB 12.8 12.7 13.0   HCT 40.5 40.2 40.1  MCV 97.8 98.5 96.6  PLT 245 241 252   Cardiac Enzymes: Recent Labs  Lab 11/18/18 1256 11/18/18 1827 11/18/18 2336 11/19/18 0601  TROPONINI <0.03 <0.03 <0.03 <0.03   BNP: Invalid input(s): POCBNP CBG: No results for input(s): GLUCAP in the last 168 hours. D-Dimer No results for input(s): DDIMER in the last 72 hours. Hgb A1c No results for input(s): HGBA1C in the last 72 hours. Lipid Profile No results for input(s): CHOL, HDL, LDLCALC, TRIG, CHOLHDL, LDLDIRECT in the last 72 hours. Thyroid function studies No results for input(s): TSH, T4TOTAL, T3FREE, THYROIDAB in the last 72 hours.  Invalid input(s): FREET3 Anemia work up No results for input(s): VITAMINB12, FOLATE, FERRITIN, TIBC, IRON, RETICCTPCT in the last 72 hours. Urinalysis    Component Value Date/Time   COLORURINE YELLOW 08/09/2016 1721   APPEARANCEUR CLEAR 08/09/2016 1721   LABSPEC >1.046 (H) 08/09/2016 1721   PHURINE 8.0 08/09/2016 1721   GLUCOSEU NEGATIVE 08/09/2016 1721   HGBUR NEGATIVE 08/09/2016 1721   BILIRUBINUR NEGATIVE 08/09/2016 1721   KETONESUR NEGATIVE 08/09/2016 1721   PROTEINUR NEGATIVE 08/09/2016 1721   UROBILINOGEN 0.2 08/16/2008 2359   NITRITE NEGATIVE 08/09/2016 1721   LEUKOCYTESUR NEGATIVE 08/09/2016 1721   Sepsis Labs Invalid input(s): PROCALCITONIN,  WBC,  LACTICIDVEN Microbiology Recent Results (from the past 240 hour(s))  Culture, blood (routine x 2) Call MD if unable to obtain prior to antibiotics being given     Status: None (Preliminary result)   Collection Time: 11/18/18  2:33 PM  Result Value Ref Range Status   Specimen Description   Final    LEFT ANTECUBITAL BOTTLES DRAWN AEROBIC AND ANAEROBIC   Special Requests Blood Culture adequate volume  Final  Culture   Final    NO GROWTH 4 DAYS Performed at [facility], [address] [place], [place] [address]    Report Status PENDING  Incomplete  Culture, blood (routine x 2) Call MD if unable to obtain  prior to antibiotics being given     Status: None (Preliminary result)   Collection Time: 11/18/18  2:43 PM  Result Value Ref Range Status   Specimen Description BLOOD LEFT ARM BOTTLES DRAWN AEROBIC AND ANAEROBIC  Final   Special Requests Blood Culture adequate volume  Final   Culture   Final    NO GROWTH 4 DAYS Performed at [facility], [address], [place], [place] [address]    Report Status PENDING  Incomplete  MRSA PCR Screening     Status: None   Collection Time: 11/18/18  8:51 PM  Result Value Ref Range Status   MRSA by PCR NEGATIVE NEGATIVE Final    Comment:        The GeneXpert MRSA Assay (FDA approved for NASAL specimens only), is one component of a comprehensive MRSA colonization surveillance program. It is not intended to diagnose MRSA infection nor to guide or monitor treatment for MRSA infections. Performed at [facility], [address], [place], [place] [address]      Time coordinating discharge: 35 minutes  SIGNED:   Rodena Goldmann, DO [facility] 11/22/2018, 11:10 AM  If 7PM-7AM, please contact night-coverage [email or web address] Password [email or web address]

## 2018-11-22 NOTE — Progress Notes (Signed)
Patient to be discharged home and in stable condition. Patient's IV and telemetry removed, WNL. Patient and husband given discharge instructions and verbalized understanding. All questions addressed and answered. Patient will be assisted out by staff via wheelchair when she is ready.  Celestia Khat, RN

## 2018-11-23 ENCOUNTER — Telehealth: Payer: Self-pay | Admitting: Cardiology

## 2018-11-23 LAB — CULTURE, BLOOD (ROUTINE X 2)
Culture: NO GROWTH
Culture: NO GROWTH
SPECIAL REQUESTS: ADEQUATE
Special Requests: ADEQUATE

## 2018-11-23 NOTE — Telephone Encounter (Signed)
Pt having dental bridge and 1 extraction on 12/06/18 - has appt 2/21 with afib clinic and Dr Harl Bowie 2/24 - she will have both providers address if she would be able to go ahead with dental procedure at that time.

## 2018-11-23 NOTE — Telephone Encounter (Signed)
Asking about if she can go ahead and get her bridge work done

## 2018-11-27 ENCOUNTER — Other Ambulatory Visit: Payer: Self-pay

## 2018-11-27 NOTE — Patient Outreach (Signed)
[facility]) Care Management  11/27/2018  MAAME DACK 1933/04/26 [id number]    EMMI-General Discharge RED ON EMMI ALERT Day # 1 Date: 11/24/2018 Red Alert Reason: " Unfilled prescriptions? Yes"   Outreach attempt # 1 to patient. Spoke with patient who reports she is doing fine since returning home. She denies any acute issues or concerns at this time. Reviewed and addressed red alert with patient. She voices that she has no issues with meds. Patient confirmed that she has all her meds. She reports that she was taking Eliquis 2.5mg -2tabs/daily but got prescription changed to 5mg  once daily. She denies any questions or concerns regarding her meds. She has supportive spouse in the home. RN CM confirmed that patient has follow up appts with PCP and cardiologist in place. She denies any issues regarding transportation. No further RN CM needs identified during this call. Advised patient that they would get one more automated EMMI-GENERAL post discharge calls to assess how they are doing following recent hospitalization and will receive a call from a nurse if any of their responses were abnormal. Patient voiced understanding and was appreciative of f/u call.    Plan: RN CM will close case as no further interventions needed at this time.  Enzo Montgomery, RN,BSN,CCM [place] Management Telephonic Care Management Coordinator Direct Phone: [phone] Toll Free: [phone] Fax: [phone]

## 2018-11-28 ENCOUNTER — Other Ambulatory Visit: Payer: Self-pay

## 2018-11-28 NOTE — Patient Outreach (Signed)
[facility]) Care Management  11/28/2018  Carol Hardin [birth year] [id number]    EMMI-General Discharge RED ON EMMI ALERT Day # 4 Date: 11/27/2018 Red Alert Reason: "Sad/hopeless/anxious/empty? Yes"    Red on EMMI dashboard received. No outreach call warranted to patient at this time. RN CM addressed issue on previous call. Patient voiced she was dong and feeling fine. No acute changes reported.      Plan: RN CM will close case at this time.   Enzo Montgomery, RN,BSN,CCM [place] Management Telephonic Care Management Coordinator Direct Phone: [phone] Toll Free: [phone] Fax: [phone]

## 2018-11-29 DIAGNOSIS — E782 Mixed hyperlipidemia: Secondary | ICD-10-CM | POA: Diagnosis not present

## 2018-11-29 DIAGNOSIS — R944 Abnormal results of kidney function studies: Secondary | ICD-10-CM | POA: Diagnosis not present

## 2018-11-29 DIAGNOSIS — J189 Pneumonia, unspecified organism: Secondary | ICD-10-CM | POA: Diagnosis not present

## 2018-11-29 DIAGNOSIS — I4891 Unspecified atrial fibrillation: Secondary | ICD-10-CM | POA: Diagnosis not present

## 2018-12-01 ENCOUNTER — Ambulatory Visit (HOSPITAL_COMMUNITY): Payer: Self-pay | Admitting: Nurse Practitioner

## 2018-12-04 ENCOUNTER — Ambulatory Visit: Payer: Medicare Other | Admitting: Cardiology

## 2018-12-04 ENCOUNTER — Encounter: Payer: Self-pay | Admitting: Cardiology

## 2018-12-04 VITALS — BP 101/69 | HR 72 | Ht 65.0 in | Wt 145.0 lb

## 2018-12-04 DIAGNOSIS — I495 Sick sinus syndrome: Secondary | ICD-10-CM

## 2018-12-04 DIAGNOSIS — I5022 Chronic systolic (congestive) heart failure: Secondary | ICD-10-CM

## 2018-12-04 NOTE — Progress Notes (Signed)
Clinical Summary Carol Hardin is a 85 y.o.female seen today for follow up of the following medical problems.   1. Afib with Tachy-brady syndrome - recent admission with afib with increased heart rates in setting of pneumonia -temporarily on higher doses of av nodal agents that admission, able to wean back to her prior doses at discharge, was also sent with prn dilt - long term plan is dofetilide per EP. She has significant brady episodes even on very low dose beta blocker.  - compliant with eliquis   2. Pneumonia - completed abx course  3. Chronic  systolic HF - LVE 58-85%, moderate RV dysfunction, moderate pleural effusion, moderaet MR - LVEF 2018 55-60%. Perhaps tachy medicated CM - volume overloaded during recent admission, diuresed. Did not requrie discharge diuretic, remains euvolemic. Suspect decompensation due to pneumonia and afib with RVR.   - bp's too soft to consider additional CHF medss - no recent edema - limiting sodium intake. Home weights around 145 lbs.  Past Medical History:  Diagnosis Date  . Anxiety   . Arthritis    "fingers, right toe" (08/09/2016)  . Basal cell carcinoma of lower leg, right   . Bradycardia    a. Holter 08/30/16 showed profound bradycardia down to 30 during awake hours, several 2 second pauses, very frequent PVCs >8000 in 48 hours, and NSVT (longest of 14 beats).  . Cardiomyopathy ([place])    a. EF 40% by echo 08/2016.  . Daily headache    "I usually wake up w/a headache; sometimes it's a migraine" (08/09/2016)  . Depression   . Diverticulosis    Hx. of  . Frequent PVCs   . GERD (gastroesophageal reflux disease)   . Hypercholesterolemia   . Migraine   . Mitral regurgitation   . MVP (mitral valve prolapse)   . NSVT (nonsustained ventricular tachycardia) ([place])    a. first noted event monitor 08/2016.  . Osteoporosis   . PAF (paroxysmal atrial fibrillation) ([place])    a. in afib at time of echo 08/2016.  Marland Kitchen Scoliosis    mild  .  Squamous cell carcinoma of neck      Allergies  Allergen Reactions  . Omeprazole Other (See Comments)    siezure  . Flagyl [Metronidazole] Nausea And Vomiting  . Aripiprazole Other (See Comments)    Unknown reaction  . Hylan G-F 20 Other (See Comments)    Unknown reaction  . Paroxetine Hcl Other (See Comments)    Headaches (a long time ago- pt doesn't really remember)  . Statins Other (See Comments)    No specific reaction given-patient states that she was advised not to take by physician  . Sulfa Antibiotics Diarrhea and Other (See Comments)    Colitis   . Toprol Xl [Metoprolol Succinate] Other (See Comments)    Dizziness--pt doesn't really remember   . Vancomycin Itching and Other (See Comments)    Pt reports that they gave it too fast- she started having itching in the scalp  . Penicillins Rash    DID THE REACTION INVOLVE: Swelling of the face/tongue/throat, SOB, or low BP? No Sudden or severe rash/hives, skin peeling, or the inside of the mouth or nose? Yes Did it require medical treatment? Unknown When did it last happen?Over 10 years If all above answers are "NO", may proceed with cephalosporin use.      Current Outpatient Medications  Medication Sig Dispense Refill  . ammonium lactate (LAC-HYDRIN) 12 % lotion Apply 1 application topically 2 (  two) times daily as needed for dry skin.    Marland Kitchen apixaban (ELIQUIS) 5 MG TABS tablet Take 1 tablet (5 mg total) by mouth 2 (two) times daily. 60 tablet 6  . bisoprolol (ZEBETA) 5 MG tablet Take 2.5 mg by mouth at bedtime.    Marland Kitchen diltiazem (CARDIZEM) 30 MG tablet Take 1 tablet (30 mg total) by mouth every 12 (twelve) hours as needed (palpitations). 60 tablet 2  . Emollient ([place] [employer] HAND & BODY) LOTN Apply 1 application topically daily as needed.    . feeding supplement, ENSURE ENLIVE, (ENSURE ENLIVE) LIQD Take 237 mLs by mouth 2 (two) times daily between meals. 237 mL 12  . Glycerin-Hypromellose-PEG 400 (HM DRY EYE RELIEF)  0.2-0.2-1 % SOLN Apply 1-2 drops to eye daily as needed (for dry eye relief).    . LORazepam (ATIVAN) 1 MG tablet Take 1 tablet (1 mg total) at bedtime by mouth. 1  qhs  Half  qam 45 tablet 3  . polyethylene glycol (MIRALAX / GLYCOLAX) packet Take 17 g by mouth daily as needed for mild constipation.      Current Facility-Administered Medications  Medication Dose Route Frequency Provider Last Rate Last Dose  . sertraline (ZOLOFT) 20 MG/ML concentrated solution 25 mg  25 mg Oral Daily [name], MD         Past Surgical History:  Procedure Laterality Date  . BASAL CELL CARCINOMA EXCISION Right    RLE  . BLEPHAROPLASTY    . BREAST BIOPSY Left   . BREAST CYST ASPIRATION Left   . BREAST CYST EXCISION Left   . BUNIONECTOMY Bilateral   . DILATION AND CURETTAGE OF UTERUS    . EXCISIONAL HEMORRHOIDECTOMY    . INTRAMEDULLARY (IM) NAIL INTERTROCHANTERIC Left 12/31/2017   Procedure: INTRAMEDULLARY (IM) NAIL INTERTROCHANTRIC;  Surgeon: Nicholes Stairs, MD;  Location: [facility];  Service: Orthopedics;  Laterality: Left;  . KNEE ARTHROSCOPY Right 04/12/2007  . KNEE ARTHROSCOPY Left   . SQUAMOUS CELL CARCINOMA EXCISION     "neck"  . TONSILLECTOMY       Allergies  Allergen Reactions  . Omeprazole Other (See Comments)    siezure  . Flagyl [Metronidazole] Nausea And Vomiting  . Aripiprazole Other (See Comments)    Unknown reaction  . Hylan G-F 20 Other (See Comments)    Unknown reaction  . Paroxetine Hcl Other (See Comments)    Headaches (a long time ago- pt doesn't really remember)  . Statins Other (See Comments)    No specific reaction given-patient states that she was advised not to take by physician  . Sulfa Antibiotics Diarrhea and Other (See Comments)    Colitis   . Toprol Xl [Metoprolol Succinate] Other (See Comments)    Dizziness--pt doesn't really remember   . Vancomycin Itching and Other (See Comments)    Pt reports that they gave it too fast- she started having  itching in the scalp  . Penicillins Rash    DID THE REACTION INVOLVE: Swelling of the face/tongue/throat, SOB, or low BP? No Sudden or severe rash/hives, skin peeling, or the inside of the mouth or nose? Yes Did it require medical treatment? Unknown When did it last happen?Over 10 years If all above answers are "NO", may proceed with cephalosporin use.       Family History  Problem Relation Age of Onset  . Heart failure Mother   . Leukemia Mother   . Heart failure Father      Social History Carol Hardin  reports that she quit smoking about 31 years ago. Her smoking use included cigarettes. She has a 20.00 pack-year smoking history. She has never used smokeless tobacco. Carol Hardin reports no history of alcohol use.   Review of Systems CONSTITUTIONAL: No weight loss, fever, chills, weakness or fatigue.  HEENT: Eyes: No visual loss, blurred vision, double vision or yellow sclerae.No hearing loss, sneezing, congestion, runny nose or sore throat.  SKIN: No rash or itching.  CARDIOVASCULAR: per hpi RESPIRATORY: No shortness of breath, cough or sputum.  GASTROINTESTINAL: No anorexia, nausea, vomiting or diarrhea. No abdominal pain or blood.  GENITOURINARY: No burning on urination, no polyuria NEUROLOGICAL: No headache, dizziness, syncope, paralysis, ataxia, numbness or tingling in the extremities. No change in bowel or bladder control.  MUSCULOSKELETAL: No muscle, back pain, joint pain or stiffness.  LYMPHATICS: No enlarged nodes. No history of splenectomy.  PSYCHIATRIC: No history of depression or anxiety.  ENDOCRINOLOGIC: No reports of sweating, cold or heat intolerance. No polyuria or polydipsia.  Marland Kitchen   Physical Examination Vitals:   12/04/18 1132  BP: 101/69  Pulse: 72  SpO2: 99%   Vitals:   12/04/18 1132  Weight: 145 lb (65.8 kg)  Height: 5\' 5"  (1.651 m)    Gen: resting comfortably, no acute distress HEENT: no scleral icterus, pupils equal round and reactive,  no palptable cervical adenopathy,  CV: RRR, no m/rg, no jvd Resp: Clear to auscultation bilaterally GI: abdomen is soft, non-tender, non-distended, normal bowel sounds, no hepatosplenomegaly MSK: extremities are warm, no edema.  Skin: warm, no rash Neuro:  no focal deficits Psych: appropriate affect   Diagnostic Studies 11/2018 echo IMPRESSIONS    1. The left ventricle has mild-moderately reduced systolic function of 12-24%. The cavity size was normal. There is concentric left ventricular hypertrophy. Left ventricular diastology could not be evaluated secondary to atrial fibrillation. Elevated  left ventricular end-diastolic pressure Left ventricular diffuse hypokinesis.  2. The right ventricle has moderately reduced systolic function. The cavity was mildly enlarged. There is no increase in right ventricular wall thickness.  3. Left atrial size was mildly dilated.  4. Right atrial size was moderately dilated.  5. Moderate pleural effusion in the left lateral region.  6. The mitral valve is myxomatous. There is mild thickening. Mitral valve regurgitation is moderate by color flow Doppler.  7. The tricuspid valve is normal in structure.  8. The aortic valve is tricuspid Aortic valve regurgitation is mild by color flow Doppler.  9. There is dilatation of the aortic root. 10. The inferior vena cava was dilated in size with >50% respiratory variability.    Assessment and Plan  1. Tachy-brady syndrome - has upcoming appt with afib clinic, plans are for dofetilide - continue current meds at this time  2. Chronic systolic HF - remains euvolemic, suspect possibly tachymediated. Follow function with control of her afib - bp too soft for other CHF meds - has not required home diuretic as of yet.     Arnoldo Lenis, M.D.

## 2018-12-04 NOTE — Patient Instructions (Addendum)
Medication Instructions:   Your physician recommends that you continue on your current medications as directed. Please refer to the Current Medication list given to you today.  Labwork:  NONE  Testing/Procedures:  NONE  Follow-Up:  Your physician recommends that you schedule a follow-up appointment in: 3 months.  Any Other Special Instructions Will Be Listed Below (If Applicable).  If you need a refill on your cardiac medications before your next appointment, please call your pharmacy. 

## 2018-12-08 ENCOUNTER — Telehealth: Payer: Self-pay | Admitting: Pharmacist

## 2018-12-08 ENCOUNTER — Ambulatory Visit (HOSPITAL_COMMUNITY)
Admission: RE | Admit: 2018-12-08 | Discharge: 2018-12-08 | Disposition: A | Discharge status: Home / Self Care | Payer: Medicare Other | Source: Ambulatory Visit | Attending: Nurse Practitioner | Admitting: Nurse Practitioner

## 2018-12-08 ENCOUNTER — Encounter (HOSPITAL_COMMUNITY): Payer: Self-pay | Admitting: Physician Assistant

## 2018-12-08 VITALS — BP 102/58 | HR 123 | Ht 65.0 in | Wt 143.0 lb

## 2018-12-08 DIAGNOSIS — I4819 Other persistent atrial fibrillation: Secondary | ICD-10-CM | POA: Diagnosis not present

## 2018-12-08 DIAGNOSIS — Z7901 Long term (current) use of anticoagulants: Secondary | ICD-10-CM | POA: Insufficient documentation

## 2018-12-08 DIAGNOSIS — Z8249 Family history of ischemic heart disease and other diseases of the circulatory system: Secondary | ICD-10-CM | POA: Diagnosis not present

## 2018-12-08 DIAGNOSIS — R5383 Other fatigue: Secondary | ICD-10-CM | POA: Diagnosis not present

## 2018-12-08 DIAGNOSIS — I5022 Chronic systolic (congestive) heart failure: Secondary | ICD-10-CM | POA: Insufficient documentation

## 2018-12-08 DIAGNOSIS — Z79899 Other long term (current) drug therapy: Secondary | ICD-10-CM | POA: Insufficient documentation

## 2018-12-08 DIAGNOSIS — R0602 Shortness of breath: Secondary | ICD-10-CM | POA: Diagnosis not present

## 2018-12-08 NOTE — Telephone Encounter (Signed)
Medication list reviewed in anticipation of upcoming Tikosyn initiation. Patient is not taking any contraindicated or QTc prolonging medications.   Patient is anticoagulated on Eliquis on the appropriate dose. Please ensure that patient has not missed any anticoagulation doses in the 3 weeks prior to Tikosyn initiation.   Patient will need to be counseled to avoid use of Benadryl while on Tikosyn and in the 2-3 days prior to Tikosyn initiation.  K was low at 3.8 on last BMP- this should be repleted to at least 4 prior to admission. I do not see any Mag levels. This should be drawn and replaced if needed to at least 1.8 prior to admission.

## 2018-12-08 NOTE — Progress Notes (Signed)
Primary Care Physician: Celene Squibb, MD Primary Cardiologist: Dr Harl Bowie Primary Electrophysiologist: Dr Rayann Heman Referring Physician: Dr Maple Mirza is a 85 y.o. female with a history of chronic systolic CHF, mod MR, persistent atrial fibrillation who presents for consultation in the [facility]. She has recently had elevated heart rates both at rest and in the setting of PNA. She has chronic issues with SOB and fatigue which could be due to her afib. Of note, she has been compliant on 5 mg dose of Eliquis for the last 4 weeks.  Today, she denies symptoms of palpitations, chest pain, orthopnea, PND, lower extremity edema, dizziness, presyncope, syncope, snoring, daytime somnolence, bleeding, or neurologic sequela. The patient is tolerating medications without difficulties and is otherwise without complaint today.    Atrial Fibrillation Risk Factors:  she does not have symptoms or diagnosis of sleep apnea. she does not have a history of rheumatic fever. she does not have a history of alcohol use. The patient does not have a history of early familial atrial fibrillation or other arrhythmias.  she has a BMI of Body mass index is 23.8 kg/m.Carol Hardin Filed Weights   12/08/18 1332  Weight: 64.9 kg    Family History  Problem Relation Age of Onset  . Heart failure Mother   . Leukemia Mother   . Heart failure Father      Atrial Fibrillation Management history:  Previous antiarrhythmic drugs: none Previous cardioversions: none Previous ablations: none CHADS2VASC score: 79 (female, age, CVA, CHF) Anticoagulation history: Eliquis   Past Medical History:  Diagnosis Date  . Anxiety   . Arthritis    "fingers, right toe" (08/09/2016)  . Basal cell carcinoma of lower leg, right   . Bradycardia    a. Holter 08/30/16 showed profound bradycardia down to 30 during awake hours, several 2 second pauses, very frequent PVCs >8000 in 48 hours, and NSVT  (longest of 14 beats).  . Cardiomyopathy ([place])    a. EF 40% by echo 08/2016.  . Daily headache    "I usually wake up w/a headache; sometimes it's a migraine" (08/09/2016)  . Depression   . Diverticulosis    Hx. of  . Frequent PVCs   . GERD (gastroesophageal reflux disease)   . Hypercholesterolemia   . Migraine   . Mitral regurgitation   . MVP (mitral valve prolapse)   . NSVT (nonsustained ventricular tachycardia) ([place])    a. first noted event monitor 08/2016.  . Osteoporosis   . PAF (paroxysmal atrial fibrillation) ([place])    a. in afib at time of echo 08/2016.  Carol Hardin Scoliosis    mild  . Squamous cell carcinoma of neck    Past Surgical History:  Procedure Laterality Date  . BASAL CELL CARCINOMA EXCISION Right    RLE  . BLEPHAROPLASTY    . BREAST BIOPSY Left   . BREAST CYST ASPIRATION Left   . BREAST CYST EXCISION Left   . BUNIONECTOMY Bilateral   . DILATION AND CURETTAGE OF UTERUS    . EXCISIONAL HEMORRHOIDECTOMY    . INTRAMEDULLARY (IM) NAIL INTERTROCHANTERIC Left 12/31/2017   Procedure: INTRAMEDULLARY (IM) NAIL INTERTROCHANTRIC;  Surgeon: Nicholes Stairs, MD;  Location: [facility];  Service: Orthopedics;  Laterality: Left;  . KNEE ARTHROSCOPY Right 04/12/2007  . KNEE ARTHROSCOPY Left   . SQUAMOUS CELL CARCINOMA EXCISION     "neck"  . TONSILLECTOMY      Current Outpatient Medications  Medication Sig Dispense Refill  .  ammonium lactate (LAC-HYDRIN) 12 % lotion Apply 1 application topically 2 (two) times daily as needed for dry skin.    Carol Hardin apixaban (ELIQUIS) 5 MG TABS tablet Take 1 tablet (5 mg total) by mouth 2 (two) times daily. 60 tablet 6  . bisoprolol (ZEBETA) 5 MG tablet Take 2.5 mg by mouth at bedtime.    Carol Hardin diltiazem (CARDIZEM) 30 MG tablet Take 1 tablet (30 mg total) by mouth every 12 (twelve) hours as needed (palpitations). 60 tablet 2  . Emollient ([employer] [employer] HAND & BODY) LOTN Apply 1 application topically daily as needed.    . feeding supplement, ENSURE ENLIVE,  (ENSURE ENLIVE) LIQD Take 237 mLs by mouth 2 (two) times daily between meals. 237 mL 12  . Glycerin-Hypromellose-PEG 400 (HM DRY EYE RELIEF) 0.2-0.2-1 % SOLN Apply 1-2 drops to eye daily as needed (for dry eye relief).    . LORazepam (ATIVAN) 1 MG tablet Take 1 tablet (1 mg total) at bedtime by mouth. 1  qhs  Half  qam 45 tablet 3  . polyethylene glycol (MIRALAX / GLYCOLAX) packet Take 17 g by mouth daily as needed for mild constipation.      Current Facility-Administered Medications  Medication Dose Route Frequency Provider Last Rate Last Dose  . sertraline (ZOLOFT) 20 MG/ML concentrated solution 25 mg  25 mg Oral Daily [name], MD        Allergies  Allergen Reactions  . Omeprazole Other (See Comments)    siezure  . Flagyl [Metronidazole] Nausea And Vomiting  . Aripiprazole Other (See Comments)    Unknown reaction  . Hylan G-F 20 Other (See Comments)    Unknown reaction  . Paroxetine Hcl Other (See Comments)    Headaches (a long time ago- pt doesn't really remember)  . Statins Other (See Comments)    No specific reaction given-patient states that she was advised not to take by physician  . Sulfa Antibiotics Diarrhea and Other (See Comments)    Colitis   . Toprol Xl [Metoprolol Succinate] Other (See Comments)    Dizziness--pt doesn't really remember   . Vancomycin Itching and Other (See Comments)    Pt reports that they gave it too fast- she started having itching in the scalp  . Penicillins Rash    DID THE REACTION INVOLVE: Swelling of the face/tongue/throat, SOB, or low BP? No Sudden or severe rash/hives, skin peeling, or the inside of the mouth or nose? Yes Did it require medical treatment? Unknown When did it last happen?Over 10 years If all above answers are "NO", may proceed with cephalosporin use.     Social History   Socioeconomic History  . Marital status: Married    Spouse name: Not on file  . Number of children: Not on file  . Years of education:  Not on file  . Highest education level: Not on file  Occupational History  . Not on file  Social Needs  . Financial resource strain: Somewhat hard  . Food insecurity:    Worry: Never true    Inability: Never true  . Transportation needs:    Medical: No    Non-medical: No  Tobacco Use  . Smoking status: Former Smoker    Packs/day: 1.00    Years: 20.00    Pack years: 20.00    Types: Cigarettes    Last attempt to quit: 1989    Years since quitting: 31.1  . Smokeless tobacco: Never Used  Substance and Sexual Activity  . Alcohol use:  No  . Drug use: No  . Sexual activity: Not Currently  Lifestyle  . Physical activity:    Days per week: 0 days    Minutes per session: 0 min  . Stress: Rather much  Relationships  . Social connections:    Talks on phone: Once a week    Gets together: Once a week    Attends religious service: Never    Active member of club or organization: No    Attends meetings of clubs or organizations: Never    Relationship status: Married  . Intimate partner violence:    Fear of current or ex partner: No    Emotionally abused: No    Physically abused: No    Forced sexual activity: No  Other Topics Concern  . Not on file  Social History Narrative  . Not on file     ROS- All systems are reviewed and negative except as per the HPI above.  Physical Exam: Vitals:   12/08/18 1332  Weight: 64.9 kg  Height: 5\' 5"  (1.651 m)    GEN- The patient is well appearing, alert and oriented x 3 today.   Head- normocephalic, atraumatic Eyes-  Sclera clear, conjunctiva pink Ears- hearing intact Oropharynx- clear Neck- supple  Lungs- Clear to ausculation bilaterally, normal work of breathing Heart- irregular rate and rhythm, no murmurs, rubs or gallops  GI- soft, NT, ND, + BS Extremities- no clubbing, cyanosis, or edema MS- no significant deformity or atrophy Skin- no rash or lesion Psych- euthymic mood, full affect Neuro- strength and sensation are  intact  Wt Readings from Last 3 Encounters:  12/08/18 64.9 kg  12/04/18 65.8 kg  11/22/18 62.7 kg    EKG today demonstrates atrial fibrillation HR 123, PVC, LAD, QRS 98, QTc 526  Echo 11/19/18 demonstrated   1. The left ventricle has mild-moderately reduced systolic function of 84-16%. The cavity size was normal. There is concentric left ventricular hypertrophy. Left ventricular diastology could not be evaluated secondary to atrial fibrillation. Elevated  left ventricular end-diastolic pressure Left ventricular diffuse hypokinesis.  2. The right ventricle has moderately reduced systolic function. The cavity was mildly enlarged. There is no increase in right ventricular wall thickness.  3. Left atrial size was mildly dilated.  4. Right atrial size was moderately dilated.  5. Moderate pleural effusion in the left lateral region.  6. The mitral valve is myxomatous. There is mild thickening. Mitral valve regurgitation is moderate by color flow Doppler.  7. The tricuspid valve is normal in structure.  8. The aortic valve is tricuspid Aortic valve regurgitation is mild by color flow Doppler.  9. There is dilatation of the aortic root. 10. The inferior vena cava was dilated in size with >50% respiratory variability.  Epic records are reviewed at length today  Assessment and Plan:  1. Persistent atrial fibrillation The patient has persistent atrial fibrillation.   Per Dr Jackalyn Lombard last note, Tikosyn advised. Discussed therapeutic options including Tikosyn and patient is agreeable to proceed. She reports that she is having some dental work done and prefers to wait on [facility] admission until after that has resolved. Continue Eliquis 5mg  BID No room in BP to titrate rate control   This patients CHA2DS2-VASc Score and unadjusted Ischemic Stroke Rate (% per year) is equal to 9.7 % stroke rate/year from a score of 6  Above score calculated as 1 point each if present [CHF, HTN, DM,  Vascular=MI/PAD/Aortic Plaque, Age if 65-74, or Female] Above score calculated as  2 points each if present [Age > 75, or Stroke/TIA/TE]  2. Chronic systolic CHF No signs of overt fluid overload. No room in BP to titrate medication.   Patient to call for [facility] admission appointment.  [facility] [address] [place], [place] [address] [phone] 12/08/2018 1:34 PM

## 2018-12-08 NOTE — Telephone Encounter (Deleted)
-----   Message from Juluis Mire, RN sent at 12/08/2018  2:11 PM EST ----- Regarding: tikosyn Pt for [employer]

## 2018-12-14 ENCOUNTER — Telehealth: Payer: Self-pay | Admitting: Cardiology

## 2018-12-14 NOTE — Telephone Encounter (Signed)
Spoke with patient and she says that she has a pill splitter but is unable to break the tablets in half evenly. [facility] contacted to see if 2.5 mg bisoprolol can be ordered. Per pharmacist at [facility], bisoprolol 2.5 mg is not available and they will not break the tablets in half for her. Patient informed and advised that [facility] will break the in half for her if she request and we could send the prescription to their pharmacy. Advised that she could get someone to help her break them or have sent to a different pharmacy. Patient said she would continue to try and break them in half and would contact our office if she wants a new rx sent to [facility].

## 2018-12-14 NOTE — Telephone Encounter (Signed)
bisoprolol (ZEBETA) 5 MG tablet   Having a hard time splitting pill in half  She said she has never taken 5mg . Dr Wynonia Lawman told her to take 2.5. Asking if a 2.5 mg rx could be sent in for her   [facility] Dr Linna Hoff, [place]

## 2018-12-21 DIAGNOSIS — E782 Mixed hyperlipidemia: Secondary | ICD-10-CM | POA: Diagnosis not present

## 2018-12-21 DIAGNOSIS — Z0000 Encounter for general adult medical examination without abnormal findings: Secondary | ICD-10-CM | POA: Diagnosis not present

## 2018-12-21 DIAGNOSIS — R944 Abnormal results of kidney function studies: Secondary | ICD-10-CM | POA: Diagnosis not present

## 2018-12-21 DIAGNOSIS — K589 Irritable bowel syndrome without diarrhea: Secondary | ICD-10-CM | POA: Diagnosis not present

## 2018-12-21 DIAGNOSIS — J189 Pneumonia, unspecified organism: Secondary | ICD-10-CM | POA: Diagnosis not present

## 2018-12-21 DIAGNOSIS — R7301 Impaired fasting glucose: Secondary | ICD-10-CM | POA: Diagnosis not present

## 2018-12-21 DIAGNOSIS — Z7901 Long term (current) use of anticoagulants: Secondary | ICD-10-CM | POA: Diagnosis not present

## 2018-12-24 ENCOUNTER — Inpatient Hospital Stay (HOSPITAL_COMMUNITY)
Admission: EM | Admit: 2018-12-24 | Discharge: 2018-12-29 | DRG: 291 | Disposition: A | Discharge status: Home Health | Payer: Medicare Other | Attending: Family Medicine | Admitting: Family Medicine

## 2018-12-24 ENCOUNTER — Other Ambulatory Visit: Payer: Self-pay

## 2018-12-24 ENCOUNTER — Encounter (HOSPITAL_COMMUNITY): Payer: Self-pay | Admitting: Emergency Medicine

## 2018-12-24 ENCOUNTER — Emergency Department (HOSPITAL_COMMUNITY): Payer: Medicare Other

## 2018-12-24 DIAGNOSIS — I130 Hypertensive heart and chronic kidney disease with heart failure and stage 1 through stage 4 chronic kidney disease, or unspecified chronic kidney disease: Principal | ICD-10-CM | POA: Diagnosis present

## 2018-12-24 DIAGNOSIS — N179 Acute kidney failure, unspecified: Secondary | ICD-10-CM | POA: Diagnosis not present

## 2018-12-24 DIAGNOSIS — I5030 Unspecified diastolic (congestive) heart failure: Secondary | ICD-10-CM | POA: Diagnosis not present

## 2018-12-24 DIAGNOSIS — Z8249 Family history of ischemic heart disease and other diseases of the circulatory system: Secondary | ICD-10-CM | POA: Diagnosis not present

## 2018-12-24 DIAGNOSIS — I493 Ventricular premature depolarization: Secondary | ICD-10-CM | POA: Diagnosis not present

## 2018-12-24 DIAGNOSIS — I5033 Acute on chronic diastolic (congestive) heart failure: Secondary | ICD-10-CM | POA: Diagnosis not present

## 2018-12-24 DIAGNOSIS — I480 Paroxysmal atrial fibrillation: Secondary | ICD-10-CM | POA: Diagnosis not present

## 2018-12-24 DIAGNOSIS — Z806 Family history of leukemia: Secondary | ICD-10-CM

## 2018-12-24 DIAGNOSIS — Z882 Allergy status to sulfonamides status: Secondary | ICD-10-CM

## 2018-12-24 DIAGNOSIS — I472 Ventricular tachycardia: Secondary | ICD-10-CM | POA: Diagnosis not present

## 2018-12-24 DIAGNOSIS — Z8701 Personal history of pneumonia (recurrent): Secondary | ICD-10-CM

## 2018-12-24 DIAGNOSIS — Z888 Allergy status to other drugs, medicaments and biological substances status: Secondary | ICD-10-CM

## 2018-12-24 DIAGNOSIS — E876 Hypokalemia: Secondary | ICD-10-CM | POA: Diagnosis not present

## 2018-12-24 DIAGNOSIS — I447 Left bundle-branch block, unspecified: Secondary | ICD-10-CM | POA: Diagnosis present

## 2018-12-24 DIAGNOSIS — Z7901 Long term (current) use of anticoagulants: Secondary | ICD-10-CM | POA: Diagnosis not present

## 2018-12-24 DIAGNOSIS — I110 Hypertensive heart disease with heart failure: Secondary | ICD-10-CM | POA: Diagnosis not present

## 2018-12-24 DIAGNOSIS — E7800 Pure hypercholesterolemia, unspecified: Secondary | ICD-10-CM | POA: Diagnosis present

## 2018-12-24 DIAGNOSIS — I429 Cardiomyopathy, unspecified: Secondary | ICD-10-CM | POA: Diagnosis not present

## 2018-12-24 DIAGNOSIS — K219 Gastro-esophageal reflux disease without esophagitis: Secondary | ICD-10-CM | POA: Diagnosis present

## 2018-12-24 DIAGNOSIS — M810 Age-related osteoporosis without current pathological fracture: Secondary | ICD-10-CM | POA: Diagnosis present

## 2018-12-24 DIAGNOSIS — I495 Sick sinus syndrome: Secondary | ICD-10-CM | POA: Diagnosis present

## 2018-12-24 DIAGNOSIS — I4891 Unspecified atrial fibrillation: Secondary | ICD-10-CM | POA: Diagnosis not present

## 2018-12-24 DIAGNOSIS — Z79899 Other long term (current) drug therapy: Secondary | ICD-10-CM

## 2018-12-24 DIAGNOSIS — I482 Chronic atrial fibrillation, unspecified: Secondary | ICD-10-CM

## 2018-12-24 DIAGNOSIS — R0602 Shortness of breath: Secondary | ICD-10-CM | POA: Diagnosis not present

## 2018-12-24 DIAGNOSIS — Z85828 Personal history of other malignant neoplasm of skin: Secondary | ICD-10-CM | POA: Diagnosis not present

## 2018-12-24 DIAGNOSIS — I5043 Acute on chronic combined systolic (congestive) and diastolic (congestive) heart failure: Secondary | ICD-10-CM | POA: Diagnosis not present

## 2018-12-24 DIAGNOSIS — I4819 Other persistent atrial fibrillation: Secondary | ICD-10-CM | POA: Diagnosis present

## 2018-12-24 DIAGNOSIS — I5023 Acute on chronic systolic (congestive) heart failure: Secondary | ICD-10-CM

## 2018-12-24 DIAGNOSIS — I959 Hypotension, unspecified: Secondary | ICD-10-CM | POA: Diagnosis not present

## 2018-12-24 DIAGNOSIS — R110 Nausea: Secondary | ICD-10-CM | POA: Diagnosis not present

## 2018-12-24 DIAGNOSIS — E785 Hyperlipidemia, unspecified: Secondary | ICD-10-CM | POA: Diagnosis present

## 2018-12-24 DIAGNOSIS — M419 Scoliosis, unspecified: Secondary | ICD-10-CM | POA: Diagnosis not present

## 2018-12-24 DIAGNOSIS — K5900 Constipation, unspecified: Secondary | ICD-10-CM | POA: Diagnosis not present

## 2018-12-24 DIAGNOSIS — I9589 Other hypotension: Secondary | ICD-10-CM | POA: Diagnosis not present

## 2018-12-24 DIAGNOSIS — Z87891 Personal history of nicotine dependence: Secondary | ICD-10-CM | POA: Diagnosis not present

## 2018-12-24 DIAGNOSIS — F411 Generalized anxiety disorder: Secondary | ICD-10-CM | POA: Diagnosis present

## 2018-12-24 DIAGNOSIS — R05 Cough: Secondary | ICD-10-CM | POA: Diagnosis not present

## 2018-12-24 DIAGNOSIS — Z8673 Personal history of transient ischemic attack (TIA), and cerebral infarction without residual deficits: Secondary | ICD-10-CM

## 2018-12-24 DIAGNOSIS — F329 Major depressive disorder, single episode, unspecified: Secondary | ICD-10-CM | POA: Diagnosis present

## 2018-12-24 DIAGNOSIS — N183 Chronic kidney disease, stage 3 (moderate): Secondary | ICD-10-CM | POA: Diagnosis not present

## 2018-12-24 DIAGNOSIS — Z880 Allergy status to penicillin: Secondary | ICD-10-CM | POA: Diagnosis not present

## 2018-12-24 DIAGNOSIS — R109 Unspecified abdominal pain: Secondary | ICD-10-CM | POA: Diagnosis not present

## 2018-12-24 DIAGNOSIS — I509 Heart failure, unspecified: Secondary | ICD-10-CM | POA: Diagnosis present

## 2018-12-24 DIAGNOSIS — I77810 Thoracic aortic ectasia: Secondary | ICD-10-CM | POA: Diagnosis present

## 2018-12-24 DIAGNOSIS — I080 Rheumatic disorders of both mitral and aortic valves: Secondary | ICD-10-CM | POA: Diagnosis present

## 2018-12-24 DIAGNOSIS — I341 Nonrheumatic mitral (valve) prolapse: Secondary | ICD-10-CM | POA: Diagnosis present

## 2018-12-24 LAB — CBC WITH DIFFERENTIAL/PLATELET
Abs Immature Granulocytes: 0.01 10*3/uL (ref 0.00–0.07)
BASOS PCT: 1 %
Basophils Absolute: 0 10*3/uL (ref 0.0–0.1)
Eosinophils Absolute: 0.2 10*3/uL (ref 0.0–0.5)
Eosinophils Relative: 2 %
HCT: 41.9 % (ref 36.0–46.0)
Hemoglobin: 13.3 g/dL (ref 12.0–15.0)
Immature Granulocytes: 0 %
Lymphocytes Relative: 33 %
Lymphs Abs: 2.5 10*3/uL (ref 0.7–4.0)
MCH: 31.3 pg (ref 26.0–34.0)
MCHC: 31.7 g/dL (ref 30.0–36.0)
MCV: 98.6 fL (ref 80.0–100.0)
MONO ABS: 0.8 10*3/uL (ref 0.1–1.0)
Monocytes Relative: 11 %
Neutro Abs: 4 10*3/uL (ref 1.7–7.7)
Neutrophils Relative %: 53 %
PLATELETS: 205 10*3/uL (ref 150–400)
RBC: 4.25 MIL/uL (ref 3.87–5.11)
RDW: 14.8 % (ref 11.5–15.5)
WBC: 7.4 10*3/uL (ref 4.0–10.5)
nRBC: 0 % (ref 0.0–0.2)

## 2018-12-24 LAB — URINALYSIS, ROUTINE W REFLEX MICROSCOPIC
BILIRUBIN URINE: NEGATIVE
Glucose, UA: NEGATIVE mg/dL
Hgb urine dipstick: NEGATIVE
Ketones, ur: NEGATIVE mg/dL
Leukocytes,Ua: NEGATIVE
Nitrite: NEGATIVE
Protein, ur: NEGATIVE mg/dL
SPECIFIC GRAVITY, URINE: 1.004 — AB (ref 1.005–1.030)
pH: 7 (ref 5.0–8.0)

## 2018-12-24 LAB — BASIC METABOLIC PANEL
ANION GAP: 9 (ref 5–15)
BUN: 20 mg/dL (ref 8–23)
CO2: 21 mmol/L — ABNORMAL LOW (ref 22–32)
Calcium: 9.1 mg/dL (ref 8.9–10.3)
Chloride: 109 mmol/L (ref 98–111)
Creatinine, Ser: 1.07 mg/dL — ABNORMAL HIGH (ref 0.44–1.00)
GFR calc Af Amer: 56 mL/min — ABNORMAL LOW (ref >60)
GFR, EST NON AFRICAN AMERICAN: 48 mL/min — AB (ref >60)
GLUCOSE: 125 mg/dL — AB (ref 70–99)
Potassium: 4.1 mmol/L (ref 3.5–5.1)
Sodium: 139 mmol/L (ref 135–145)

## 2018-12-24 LAB — BRAIN NATRIURETIC PEPTIDE: B Natriuretic Peptide: 1248 pg/mL — ABNORMAL HIGH (ref 0.0–100.0)

## 2018-12-24 LAB — TROPONIN I: Troponin I: <0.03 ng/mL (ref <0.03)

## 2018-12-24 MED ORDER — LORAZEPAM 1 MG PO TABS
1.0000 mg | ORAL_TABLET | Freq: Every day | ORAL | Status: DC
  Administered 2018-12-24 – 2018-12-27 (×4): 1 mg via ORAL
  Filled 2018-12-24 (×4): qty 1

## 2018-12-24 MED ORDER — ENSURE ENLIVE PO LIQD
237.0000 mL | Freq: Two times a day (BID) | ORAL | Status: DC
  Administered 2018-12-25 – 2018-12-29 (×6): 237 mL via ORAL

## 2018-12-24 MED ORDER — SODIUM CHLORIDE 0.9 % IV SOLN: 250.0000 mL | INTRAVENOUS | Status: DC | PRN

## 2018-12-24 MED ORDER — FUROSEMIDE 10 MG/ML IJ SOLN
40.0000 mg | Freq: Once | INTRAMUSCULAR | Status: AC
  Administered 2018-12-24: 40 mg via INTRAVENOUS
  Filled 2018-12-24: qty 4

## 2018-12-24 MED ORDER — ONDANSETRON HCL 4 MG/2ML IJ SOLN
4.0000 mg | Freq: Four times a day (QID) | INTRAMUSCULAR | Status: DC | PRN
  Administered 2018-12-28: 4 mg via INTRAVENOUS
  Filled 2018-12-24: qty 2

## 2018-12-24 MED ORDER — AMMONIUM LACTATE 12 % EX LOTN
1.0000 "application " | TOPICAL_LOTION | Freq: Two times a day (BID) | CUTANEOUS | Status: DC | PRN
  Filled 2018-12-24: qty 400

## 2018-12-24 MED ORDER — POLYETHYLENE GLYCOL 3350 17 G PO PACK
17.0000 g | PACK | Freq: Every day | ORAL | Status: DC | PRN
  Filled 2018-12-24: qty 1

## 2018-12-24 MED ORDER — BISOPROLOL FUMARATE 5 MG PO TABS
2.5000 mg | ORAL_TABLET | Freq: Two times a day (BID) | ORAL | Status: DC
  Administered 2018-12-25 – 2018-12-26 (×3): 2.5 mg via ORAL
  Filled 2018-12-24: qty 0.5
  Filled 2018-12-24 (×4): qty 1
  Filled 2018-12-24: qty 0.5

## 2018-12-24 MED ORDER — SODIUM CHLORIDE 0.9% FLUSH
3.0000 mL | Freq: Two times a day (BID) | INTRAVENOUS | Status: DC
  Administered 2018-12-24 – 2018-12-29 (×10): 3 mL via INTRAVENOUS

## 2018-12-24 MED ORDER — FUROSEMIDE 10 MG/ML IJ SOLN
40.0000 mg | Freq: Two times a day (BID) | INTRAMUSCULAR | Status: DC
  Administered 2018-12-25: 40 mg via INTRAVENOUS
  Filled 2018-12-24: qty 4

## 2018-12-24 MED ORDER — SODIUM CHLORIDE 0.9% FLUSH: 3.0000 mL | INTRAVENOUS | Status: DC | PRN

## 2018-12-24 MED ORDER — APIXABAN 5 MG PO TABS
5.0000 mg | ORAL_TABLET | Freq: Two times a day (BID) | ORAL | Status: DC
  Administered 2018-12-24 – 2018-12-29 (×10): 5 mg via ORAL
  Filled 2018-12-24 (×10): qty 1

## 2018-12-24 MED ORDER — ENOXAPARIN SODIUM 40 MG/0.4ML ~~LOC~~ SOLN: 40.0000 mg | SUBCUTANEOUS | Status: DC

## 2018-12-24 MED ORDER — DILTIAZEM HCL 30 MG PO TABS: 30.0000 mg | ORAL_TABLET | Freq: Two times a day (BID) | ORAL | Status: DC | PRN

## 2018-12-24 MED ORDER — ACETAMINOPHEN 325 MG PO TABS
650.0000 mg | ORAL_TABLET | ORAL | Status: DC | PRN
  Administered 2018-12-28: 650 mg via ORAL
  Filled 2018-12-24: qty 2

## 2018-12-24 NOTE — ED Triage Notes (Signed)
Patient c/o shortness of breath with productive cough x1 week. Patient states thick yellow sputum. Denies any fever. Per patient recently had pneumonia in February. Patient reports left side chest pain that started this morning. Patient states "it feels like I can't take a deep breath." Patient describes discomfort as "heavy."

## 2018-12-24 NOTE — ED Notes (Signed)
Pt offered hospital bed for comfort as no IP rooms available.  Declined at this time.

## 2018-12-24 NOTE — H&P (Addendum)
History and Physical  Carol Hardin NLG:[id number] DOB: 1933/09/09 DOA: 12/24/2018  Referring physician: Kem Parkinson, PA PCP: Celene Squibb, MD  Outpatient Specialists: None Patient coming from: Home & is able to ambulate this  Chief Complaint: Shortness of breath  HPI: Carol Hardin is a 85 y.o. female with medical history significant for chronic atrial fibrillation on Eliquis anticoagulation, cardiomyopathy, GERD depression diverticulosis basal cell carcinoma anxiety malnutrition and recent pneumonia who presented emergency department today with shortness of breath and occasional productive cough she says symptoms have been present for 1 week probably more than that because she was admitted last month for pneumonia.  She also complains of intermittent sharp stabbing pain to her left breast area and unable to take a deep breath fully.   ED Course: IV Lasix with good response and improvement in her shortness of breath  Review of Systems:  . Pt complains of extremity edema, epigastric pain, shortness of breath and cough  Pt denies any fever chills decreased appetite diarrhea body aches.  Review of systems are otherwise negative   Past Medical History:  Diagnosis Date  . Anxiety   . Arthritis    "fingers, right toe" (08/09/2016)  . Basal cell carcinoma of lower leg, right   . Bradycardia    a. Holter 08/30/16 showed profound bradycardia down to 30 during awake hours, several 2 second pauses, very frequent PVCs >8000 in 48 hours, and NSVT (longest of 14 beats).  . Cardiomyopathy ([place])    a. EF 40% by echo 08/2016.  . Daily headache    "I usually wake up w/a headache; sometimes it's a migraine" (08/09/2016)  . Depression   . Diverticulosis    Hx. of  . Frequent PVCs   . GERD (gastroesophageal reflux disease)   . Hypercholesterolemia   . Migraine   . Mitral regurgitation   . MVP (mitral valve prolapse)   . NSVT (nonsustained ventricular tachycardia) ([place])    a. first  noted event monitor 08/2016.  . Osteoporosis   . PAF (paroxysmal atrial fibrillation) ([place])    a. in afib at time of echo 08/2016.  Marland Kitchen Pneumonia   . Scoliosis    mild  . Squamous cell carcinoma of neck    Past Surgical History:  Procedure Laterality Date  . BASAL CELL CARCINOMA EXCISION Right    RLE  . BLEPHAROPLASTY    . BREAST BIOPSY Left   . BREAST CYST ASPIRATION Left   . BREAST CYST EXCISION Left   . BUNIONECTOMY Bilateral   . DILATION AND CURETTAGE OF UTERUS    . EXCISIONAL HEMORRHOIDECTOMY    . INTRAMEDULLARY (IM) NAIL INTERTROCHANTERIC Left 12/31/2017   Procedure: INTRAMEDULLARY (IM) NAIL INTERTROCHANTRIC;  Surgeon: Nicholes Stairs, MD;  Location: [facility];  Service: Orthopedics;  Laterality: Left;  . KNEE ARTHROSCOPY Right 04/12/2007  . KNEE ARTHROSCOPY Left   . SQUAMOUS CELL CARCINOMA EXCISION     "neck"  . TONSILLECTOMY      Social History:  reports that she quit smoking about 31 years ago. Her smoking use included cigarettes. She has a 20.00 pack-year smoking history. She has never used smokeless tobacco. She reports that she does not drink alcohol or use drugs.   Allergies  Allergen Reactions  . Omeprazole Other (See Comments)    siezure  . Flagyl [Metronidazole] Nausea And Vomiting  . Aripiprazole Other (See Comments)    Unknown reaction  . Hylan G-F 20 Other (See Comments)    Unknown reaction  .  Paroxetine Hcl Other (See Comments)    Headaches (a long time ago- pt doesn't really remember)  . Statins Other (See Comments)    No specific reaction given-patient states that she was advised not to take by physician  . Sulfa Antibiotics Diarrhea and Other (See Comments)    Colitis   . Toprol Xl [Metoprolol Succinate] Other (See Comments)    Dizziness--pt doesn't really remember   . Vancomycin Itching and Other (See Comments)    Pt reports that they gave it too fast- she started having itching in the scalp  . Penicillins Rash    DID THE REACTION INVOLVE:  Swelling of the face/tongue/throat, SOB, or low BP? No Sudden or severe rash/hives, skin peeling, or the inside of the mouth or nose? Yes Did it require medical treatment? Unknown When did it last happen?Over 10 years If all above answers are "NO", may proceed with cephalosporin use.     Family History  Problem Relation Age of Onset  . Heart failure Mother   . Leukemia Mother   . Heart failure Father       Prior to Admission medications   Medication Sig Start Date End Date Taking? Authorizing Provider  ammonium lactate (LAC-HYDRIN) 12 % lotion Apply 1 application topically 2 (two) times daily as needed for dry skin.   Yes [provider]  apixaban (ELIQUIS) 5 MG TABS tablet Take 1 tablet (5 mg total) by mouth 2 (two) times daily. 11/03/18  Yes Allred, Jeneen Rinks, MD  bisoprolol (ZEBETA) 5 MG tablet Take 2.5 mg by mouth 2 (two) times daily.    Yes [provider]  diltiazem (CARDIZEM) 30 MG tablet Take 1 tablet (30 mg total) by mouth every 12 (twelve) hours as needed (palpitations). 11/22/18  Yes Shah, Pratik D, DO  Emollient ([employer] HAND & BODY) LOTN Apply 1 application topically daily as needed.   Yes [provider]  feeding supplement, ENSURE ENLIVE, (ENSURE ENLIVE) LIQD Take 237 mLs by mouth 2 (two) times daily between meals. Patient taking differently: Take 237 mLs by mouth daily.  11/22/18  Yes Shah, Pratik D, DO  LORazepam (ATIVAN) 1 MG tablet Take 1 tablet (1 mg total) at bedtime by mouth. 1  qhs  Half  qam Patient taking differently: Take 1 mg by mouth at bedtime.  08/17/17  Yes Plovsky, Berneta Sages, MD  polyethylene glycol (MIRALAX / GLYCOLAX) packet Take 17 g by mouth daily as needed for mild constipation.    Yes [provider]    Physical Exam: BP 94/68   Pulse 74   Temp (!) 97.4 F (36.3 C) (Oral)   Resp 20   Ht 5\' 5"  (1.651 m)   Wt 64.9 kg   SpO2 92%   BMI 23.80 kg/m   Exam:  . General: 85 y.o. year-old female well developed  well nourished in no acute distress.  Alert and oriented x3. . Cardiovascular: IRRegular rate and rhythm with no rubs .  Positive S2 gallop no thyromegaly or JVD noted.   Marland Kitchen Respiratory: Clear to auscultation with no wheezes or rales. Good inspiratory effort. . Abdomen: Soft nontender nondistended with normal bowel sounds x4 quadrants. . Musculoskeletal: Bilateral +1 pitting  lower extremity edema. 2/4 pulses in all 4 extremities. . Skin: No ulcerative lesions noted or rashes, . Psychiatry: Mood is appropriate for condition and setting           Labs on Admission:  Basic Metabolic Panel: Recent Labs  Lab 12/24/18 1020  NA  139  K 4.1  CL 109  CO2 21*  GLUCOSE 125*  BUN 20  CREATININE 1.07*  CALCIUM 9.1   Liver Function Tests: No results for input(s): AST, ALT, ALKPHOS, BILITOT, PROT, ALBUMIN in the last 168 hours. No results for input(s): LIPASE, AMYLASE in the last 168 hours. No results for input(s): AMMONIA in the last 168 hours. CBC: Recent Labs  Lab 12/24/18 1020  WBC 7.4  NEUTROABS 4.0  HGB 13.3  HCT 41.9  MCV 98.6  PLT 205   Cardiac Enzymes: Recent Labs  Lab 12/24/18 1020  TROPONINI <0.03    BNP (last 3 results) Recent Labs    11/18/18 1256 12/24/18 1020  BNP 942.0* 1,248.0*    ProBNP (last 3 results) No results for input(s): PROBNP in the last 8760 hours.  CBG: No results for input(s): GLUCAP in the last 168 hours.  Radiological Exams on Admission: Dg Chest Portable 1 View  Result Date: 12/24/2018 CLINICAL DATA:  Patient with productive cough.  Shortness of breath. EXAM: PORTABLE CHEST 1 VIEW COMPARISON:  Chest radiograph 11/20/2018 FINDINGS: Monitoring leads overlie the patient. Stable cardiomegaly. Small left pleural effusion with underlying opacities. No pneumothorax. Thoracic spine degenerative changes. IMPRESSION: Small left pleural effusion with underlying opacities which may represent atelectasis or infection. Electronically Signed   By:  Lovey Newcomer M.D.   On: 12/24/2018 11:14    EKG: Independently reviewed.  No STEMI  Assessment/Plan Present on Admission: . PAF (paroxysmal atrial fibrillation) ([place]) . Generalized anxiety disorder . Low blood pressure . Mitral valve prolapse . Acute on chronic diastolic HF (heart failure) ([place]) . Atrial fibrillation, chronic . Congestive heart failure with left ventricular diastolic dysfunction, unspecified failure chronicity (HCC)  Principal Problem:   Acute on chronic diastolic HF (heart failure) (HCC) Active Problems:   Low blood pressure   Generalized anxiety disorder   Mitral valve prolapse   PAF (paroxysmal atrial fibrillation) (HCC)   Atrial fibrillation, chronic   Congestive heart failure with left ventricular diastolic dysfunction, unspecified failure chronicity ([place])   1.  Acute on chronic diastolic congestive heart failure.  BNP is 1248, patient has been started on IV Lasix continue to monitor for improvement and chemistry  2.  Chronic atrial fibrillation on Eliquis patient still has rapid ventricular rate but stable  3.  Anxiety continue lorazepam  4.Mitral valve prolapse  5.Hypertension patient stated her blood pressure usually low but we will continue to monitor blood pressure is not terribly low is 94/68  6.  Mild acute kidney injury will monitor closely because of IV Lasix is being given.  7.  Chest pain likely due to atrial fibrillation.  Troponin is negative and EKG is reassuring  Severity of Illness: The appropriate patient status for this patient is INPATIENT. Inpatient status is judged to be reasonable and necessary in order to provide the required intensity of service to ensure the patient's safety. The patient's presenting symptoms, physical exam findings, and initial radiographic and laboratory data in the context of their chronic comorbidities is felt to place them at high risk for further clinical deterioration. Furthermore, it is not anticipated  that the patient will be medically stable for discharge from the hospital within 2 midnights of admission. The following factors support the patient status of inpatient.   " Congestive heart failure requiring IV Lasix * I certify that at the point of admission it is my clinical judgment that the patient will require inpatient hospital care spanning beyond 2 midnights from the point  of admission due to high intensity of service, high risk for further deterioration and high frequency of surveillance required.*    DVT prophylaxis: Eliquis  Code Status: Full  Family Communication: Husband Herman at bedside  Disposition Plan: Home when stable  Consults called: None  Admission status: Patient    Cristal Deer MD [facility] Pager [phone]  If 7PM-7AM, please contact night-coverage [email or web address] Password [facility]  12/24/2018, 4:13 PM

## 2018-12-24 NOTE — ED Notes (Signed)
Pt ambulated short distance to bathroom with assist.  Upon return, HR noted to be 125-145 with continued afib.  Pt asymptomatic with this and not overtly short of breath.

## 2018-12-24 NOTE — ED Provider Notes (Signed)
[facility] EMERGENCY DEPARTMENT Provider Note   CSN: [id number] Arrival date & time: 12/24/18  1001    History   Chief Complaint Chief Complaint  Patient presents with  . Shortness of Breath    HPI Carol Hardin is a 85 y.o. female.     HPI   Carol Hardin is a 85 y.o. female with a history of pneumonia, Atrial fib with RVR, stroke, generalized anxiety, and mitral valve prolapse, anti-coagulated on Eliquis, presents to the Emergency Department complaining of increasing shortness of breath and occasionally productive cough.  Symptoms have been present for 1 week.  Initially, she reports shortness of breath was associated with exertion, but now she also endorses having shortness of breath at rest.  She also complains of intermittent sharp stabbing pains to her left breast area which  causes her to be unable to take a deep breath.  She was diagnosed with pneumonia in February and completed antibiotics.  She denies fever, chills, decreased appetite.  She does endorse some lower extremity edema, but does not take any dieretics.   Past Medical History:  Diagnosis Date  . Anxiety   . Arthritis    "fingers, right toe" (08/09/2016)  . Basal cell carcinoma of lower leg, right   . Bradycardia    a. Holter 08/30/16 showed profound bradycardia down to 30 during awake hours, several 2 second pauses, very frequent PVCs >8000 in 48 hours, and NSVT (longest of 14 beats).  . Cardiomyopathy ([place])    a. EF 40% by echo 08/2016.  . Daily headache    "I usually wake up w/a headache; sometimes it's a migraine" (08/09/2016)  . Depression   . Diverticulosis    Hx. of  . Frequent PVCs   . GERD (gastroesophageal reflux disease)   . Hypercholesterolemia   . Migraine   . Mitral regurgitation   . MVP (mitral valve prolapse)   . NSVT (nonsustained ventricular tachycardia) ([place])    a. first noted event monitor 08/2016.  . Osteoporosis   . PAF (paroxysmal atrial fibrillation) ([place])    a. in  afib at time of echo 08/2016.  Marland Kitchen Pneumonia   . Scoliosis    mild  . Squamous cell carcinoma of neck     Patient Active Problem List   Diagnosis Date Noted  . Malnutrition of moderate degree 11/20/2018  . Atrial fibrillation with rapid ventricular response ([place]) 11/18/2018  . GERD (gastroesophageal reflux disease) 11/18/2018  . CAP (community acquired pneumonia) 11/18/2018  . Acute on chronic diastolic HF (heart failure) ([place]) 11/18/2018  . Stroke (cerebrum) ([place]) 05/19/2018  . Hip fracture ([place]) 12/31/2017  . PAF (paroxysmal atrial fibrillation) ([place])   . NSVT (nonsustained ventricular tachycardia) ([place])   . Mitral regurgitation   . Frequent PVCs   . Bradycardia   . Essential hypertension   . Hypokalemia   . Diverticulitis 08/09/2016  . Diverticulosis of colon 04/22/2016  . Diverticulosis of large intestine without perforation or abscess without bleeding 04/22/2016  . Generalized anxiety disorder 04/22/2016  . Irritable bowel syndrome without diarrhea 04/22/2016  . Mitral valve prolapse 04/22/2016  . Mixed hyperlipidemia 04/22/2016  . Osteopenia 04/22/2016  . Low blood pressure 09/24/2014  . Hypercholesterolemia   . History of depression   . Scoliosis   . Osteoporosis   . DYSPNEA 05/31/2008    Past Surgical History:  Procedure Laterality Date  . BASAL CELL CARCINOMA EXCISION Right    RLE  . BLEPHAROPLASTY    . BREAST BIOPSY  Left   . BREAST CYST ASPIRATION Left   . BREAST CYST EXCISION Left   . BUNIONECTOMY Bilateral   . DILATION AND CURETTAGE OF UTERUS    . EXCISIONAL HEMORRHOIDECTOMY    . INTRAMEDULLARY (IM) NAIL INTERTROCHANTERIC Left 12/31/2017   Procedure: INTRAMEDULLARY (IM) NAIL INTERTROCHANTRIC;  Surgeon: Nicholes Stairs, MD;  Location: [facility];  Service: Orthopedics;  Laterality: Left;  . KNEE ARTHROSCOPY Right 04/12/2007  . KNEE ARTHROSCOPY Left   . SQUAMOUS CELL CARCINOMA EXCISION     "neck"  . TONSILLECTOMY       OB History    Gravida  6    Para  4   Term  4   Preterm      AB  2   Living        SAB  2   TAB      Ectopic      Multiple      Live Births               Home Medications    Prior to Admission medications   Medication Sig Start Date End Date Taking? Authorizing Provider  ammonium lactate (LAC-HYDRIN) 12 % lotion Apply 1 application topically 2 (two) times daily as needed for dry skin.    [provider]  apixaban (ELIQUIS) 5 MG TABS tablet Take 1 tablet (5 mg total) by mouth 2 (two) times daily. 11/03/18   Allred, Jeneen Rinks, MD  bisoprolol (ZEBETA) 5 MG tablet Take 2.5 mg by mouth at bedtime.    [provider]  diltiazem (CARDIZEM) 30 MG tablet Take 1 tablet (30 mg total) by mouth every 12 (twelve) hours as needed (palpitations). Patient not taking: Reported on 12/08/2018 11/22/18   Manuella Ghazi, Pratik D, DO  Emollient [facility] [employer] HAND & BODY) LOTN Apply 1 application topically daily as needed.    [provider]  feeding supplement, ENSURE ENLIVE, (ENSURE ENLIVE) LIQD Take 237 mLs by mouth 2 (two) times daily between meals. Patient taking differently: Take 237 mLs by mouth daily.  11/22/18   Manuella Ghazi, Pratik D, DO  Glycerin-Hypromellose-PEG 400 (HM DRY EYE RELIEF) 0.2-0.2-1 % SOLN Apply 1-2 drops to eye daily as needed (for dry eye relief).    [provider]  LORazepam (ATIVAN) 1 MG tablet Take 1 tablet (1 mg total) at bedtime by mouth. 1  qhs  Half  qam Patient taking differently: Take 1 mg by mouth at bedtime.  08/17/17   Plovsky, Berneta Sages, MD  polyethylene glycol (MIRALAX / GLYCOLAX) packet Take 17 g by mouth daily as needed for mild constipation.     [provider]  Saccharomyces boulardii (PROBIOTIC) 250 MG CAPS Take by mouth.    [provider]    Family History Family History  Problem Relation Age of Onset  . Heart failure Mother   . Leukemia Mother   . Heart failure Father     Social History Social History   Tobacco Use  . Smoking status:  Former Smoker    Packs/day: 1.00    Years: 20.00    Pack years: 20.00    Types: Cigarettes    Last attempt to quit: 1989    Years since quitting: 31.2  . Smokeless tobacco: Never Used  Substance Use Topics  . Alcohol use: No  . Drug use: No     Allergies   Omeprazole; Flagyl [metronidazole]; Aripiprazole; Hylan g-f 20; Paroxetine hcl; Statins; Sulfa antibiotics; Toprol xl [metoprolol succinate]; Vancomycin; and Penicillins  Review of Systems Review of Systems  Constitutional: Negative for appetite change, chills and fever.  HENT: Negative for congestion, sore throat and trouble swallowing.   Respiratory: Positive for cough and shortness of breath. Negative for chest tightness and wheezing.   Cardiovascular: Positive for chest pain and leg swelling.  Gastrointestinal: Negative for abdominal pain, nausea and vomiting.  Genitourinary: Negative for dysuria.  Musculoskeletal: Negative for arthralgias and neck pain.  Skin: Negative for rash.  Neurological: Negative for dizziness, weakness and numbness.  Hematological: Negative for adenopathy.     Physical Exam Updated Vital Signs BP 102/77 (BP Location: Left Arm)   Pulse (!) 117   Temp (!) 97.4 F (36.3 C) (Oral)   Resp (!) 26   Ht 5\' 5"  (1.651 m)   Wt 64.9 kg   SpO2 95%   BMI 23.80 kg/m   Physical Exam Vitals signs and nursing note reviewed.  Constitutional:      Comments: Anxious appearing  HENT:     Head: Normocephalic.     Mouth/Throat:     Mouth: Mucous membranes are moist.  Neck:     Musculoskeletal: Normal range of motion.     Vascular: No JVD.     Trachea: Phonation normal.  Cardiovascular:     Rate and Rhythm: Tachycardia present. Rhythm irregular.     Pulses: Normal pulses.  Pulmonary:     Effort: No respiratory distress.     Breath sounds: No wheezing, rhonchi or rales.     Comments: Pt able to speak in full sentences, but speech is mildly labored.  Lungs are clear to auscultation bilaterally no  rales or wheezing. Abdominal:     General: There is no distension.     Palpations: Abdomen is soft. There is no mass.     Tenderness: There is no abdominal tenderness.  Musculoskeletal:     Right lower leg: Edema present.     Left lower leg: Edema present.     Comments: 1+ pitting edema of bilateral lower extremities.  Skin:    General: Skin is warm.     Capillary Refill: Capillary refill takes less than 2 seconds.     Findings: No erythema or rash.  Neurological:     General: No focal deficit present.     Mental Status: She is alert.     Sensory: No sensory deficit.     Motor: No weakness.      ED Treatments / Results  Labs (all labs ordered are listed, but only abnormal results are displayed) Labs Reviewed  BASIC METABOLIC PANEL - Abnormal; Notable for the following components:      Result Value   CO2 21 (*)    Glucose, Bld 125 (*)    Creatinine, Ser 1.07 (*)    GFR calc non Af Amer 48 (*)    GFR calc Af Amer 56 (*)    All other components within normal limits  BRAIN NATRIURETIC PEPTIDE - Abnormal; Notable for the following components:   B Natriuretic Peptide 1,248.0 (*)    All other components within normal limits  CBC WITH DIFFERENTIAL/PLATELET  TROPONIN I  URINALYSIS, ROUTINE W REFLEX MICROSCOPIC    EKG EKG Interpretation  Date/Time:  Sunday December 24 2018 10:16:39 EDT Ventricular Rate:  124 PR Interval:    QRS Duration: 116 QT Interval:  362 QTC Calculation: 520 R Axis:   -53 Text Interpretation:  Atrial fibrillation Ventricular premature complex Incomplete left bundle branch block LVH with secondary repolarization abnormality Anterior  Q waves, possibly due to LVH Baseline wander in lead(s) II III aVF Confirmed by Nat Christen [phone]) on 12/24/2018 10:25:54 AM   Radiology Dg Chest Portable 1 View  Result Date: 12/24/2018 CLINICAL DATA:  Patient with productive cough.  Shortness of breath. EXAM: PORTABLE CHEST 1 VIEW COMPARISON:  Chest radiograph 11/20/2018  FINDINGS: Monitoring leads overlie the patient. Stable cardiomegaly. Small left pleural effusion with underlying opacities. No pneumothorax. Thoracic spine degenerative changes. IMPRESSION: Small left pleural effusion with underlying opacities which may represent atelectasis or infection. Electronically Signed   By: Lovey Newcomer M.D.   On: 12/24/2018 11:14    Procedures Procedures (including critical care time)  Medications Ordered in ED Medications  furosemide (LASIX) injection 40 mg (40 mg Intravenous Given 12/24/18 1148)     Initial Impression / Assessment and Plan / ED Course  I have reviewed the triage vital signs and the nursing notes.  Pertinent labs & imaging results that were available during my care of the patient were reviewed by me and considered in my medical decision making (see chart for details).    Patient also seen by Dr. Lacinda Axon and care plan discussed.     Review of patient's chest x-ray, a small left pleural effusion is present.  I feel this is less likely an infectious source.  No history of fever, no leukocytosis.  we will try IV Lasix  1345  On recheck, pt reports some improvement in her sx's after IV Lasix.  Diuresing well.   Continues to have intermittent chest pain and intermittent episodes of tachycardia.  I will consult hospitalist for admit  [facility] hospitalist, Dr. Kyung Bacca, and discussed pt findings, she agrees to see pt in ER and admit.    Final Clinical Impressions(s) / ED Diagnoses   Final diagnoses:  Acute on chronic diastolic congestive heart failure [facility])  Atrial fibrillation, chronic    ED Discharge Orders    None       Bufford Lope 12/24/18 1534    Nat Christen, MD 12/25/18 [phone]

## 2018-12-25 ENCOUNTER — Inpatient Hospital Stay (HOSPITAL_COMMUNITY): Payer: Medicare Other

## 2018-12-25 ENCOUNTER — Encounter (HOSPITAL_COMMUNITY): Payer: Self-pay

## 2018-12-25 ENCOUNTER — Other Ambulatory Visit: Payer: Self-pay

## 2018-12-25 DIAGNOSIS — I495 Sick sinus syndrome: Secondary | ICD-10-CM

## 2018-12-25 DIAGNOSIS — I4891 Unspecified atrial fibrillation: Secondary | ICD-10-CM

## 2018-12-25 DIAGNOSIS — I5023 Acute on chronic systolic (congestive) heart failure: Secondary | ICD-10-CM

## 2018-12-25 LAB — BASIC METABOLIC PANEL
Anion gap: 10 (ref 5–15)
BUN: 20 mg/dL (ref 8–23)
CO2: 24 mmol/L (ref 22–32)
Calcium: 8.9 mg/dL (ref 8.9–10.3)
Chloride: 106 mmol/L (ref 98–111)
Creatinine, Ser: 1.17 mg/dL — ABNORMAL HIGH (ref 0.44–1.00)
GFR calc Af Amer: 50 mL/min — ABNORMAL LOW (ref >60)
GFR calc non Af Amer: 43 mL/min — ABNORMAL LOW (ref >60)
Glucose, Bld: 82 mg/dL (ref 70–99)
Potassium: 3.5 mmol/L (ref 3.5–5.1)
Sodium: 140 mmol/L (ref 135–145)

## 2018-12-25 LAB — MAGNESIUM: Magnesium: 2 mg/dL (ref 1.7–2.4)

## 2018-12-25 LAB — MRSA PCR SCREENING: MRSA by PCR: NEGATIVE

## 2018-12-25 MED ORDER — FUROSEMIDE 10 MG/ML IJ SOLN
40.0000 mg | Freq: Once | INTRAMUSCULAR | Status: AC
  Administered 2018-12-25: 40 mg via INTRAVENOUS
  Filled 2018-12-25: qty 4

## 2018-12-25 MED ORDER — POTASSIUM CHLORIDE CRYS ER 20 MEQ PO TBCR
20.0000 meq | EXTENDED_RELEASE_TABLET | Freq: Once | ORAL | Status: AC
  Administered 2018-12-25: 20 meq via ORAL
  Filled 2018-12-25: qty 1

## 2018-12-25 NOTE — Evaluation (Signed)
Physical Therapy Evaluation Patient Details Name: Carol Hardin MRN: [id number] DOB: November 07, 1932 Today's Date: 12/25/2018   History of Present Illness  Carol Hardin is a 85 y.o. female with medical history significant for chronic atrial fibrillation on Eliquis anticoagulation, cardiomyopathy, GERD depression diverticulosis basal cell carcinoma anxiety malnutrition and recent pneumonia who presented emergency department today with shortness of breath and occasional productive cough she says symptoms have been present for 1 week probably more than that because she was admitted last month for pneumonia.  She also complains of intermittent sharp stabbing pain to her left breast area and unable to take a deep breath fully.    Clinical Impression  Patient functioning near baseline for functional mobility and gait, slightly labored cadence without loss of balance, occasional veering left/right and verbal cueing to left RUE swing, limited mostly due to c/o mild lightheadedness and weakness.  Patient tolerated sitting up in chair after therapy.  Patient will benefit from continued physical therapy in hospital and recommended venue below to increase strength, balance, endurance for safe ADLs and gait.     Follow Up Recommendations Home health PT    Equipment Recommendations  None recommended by PT    Recommendations for Other Services       Precautions / Restrictions Precautions Precautions: Fall Restrictions Weight Bearing Restrictions: No      Mobility  Bed Mobility Overal bed mobility: Modified Independent             General bed mobility comments: increased time  Transfers Overall transfer level: Needs assistance Equipment used: None Transfers: Sit to/from Stand;Stand Pivot Transfers Sit to Stand: Supervision Stand pivot transfers: Supervision          Ambulation/Gait Ambulation/Gait assistance: Supervision Gait Distance (Feet): 100 Feet Assistive device:  None Gait Pattern/deviations: Decreased step length - right;Decreased step length - left;Decreased stride length;Staggering left;Staggering right Gait velocity: decreased   General Gait Details: slightly labored cadence with occasional staggering left/right, required verbal cues to let RUE swing with fair/good carryover, no loss of balance, on room air with SpO2 at 97%  Stairs            Wheelchair Mobility    Modified Rankin (Stroke Patients Only)       Balance Overall balance assessment: Mild deficits observed, not formally tested                                           Pertinent Vitals/Pain Pain Assessment: Faces Faces Pain Scale: Hurts a little bit Pain Location: left side of chest wall Pain Descriptors / Indicators: Discomfort Pain Intervention(s): Limited activity within patient's tolerance;Monitored during session    Home Living Family/patient expects to be discharged to:: Private residence Living Arrangements: Spouse/significant other Available Help at Discharge: Family;Available 24 hours/day Type of Home: House Home Access: Stairs to enter Entrance Stairs-Rails: Right Entrance Stairs-Number of Steps: 6 Home Layout: One level Home Equipment: Walker - 2 wheels;Walker - 4 wheels;Cane - single point;Shower seat      Prior Function Level of Independence: Independent with assistive device(s)         Comments: household and short distanced community ambulator with [facility]     Hand Dominance   Dominant Hand: Right    Extremity/Trunk Assessment   Upper Extremity Assessment Upper Extremity Assessment: Defer to OT evaluation    Lower Extremity Assessment Lower Extremity Assessment: Generalized weakness  Cervical / Trunk Assessment Cervical / Trunk Assessment: Normal  Communication   Communication: No difficulties  Cognition Arousal/Alertness: Awake/alert Behavior During Therapy: WFL for tasks assessed/performed Overall Cognitive  Status: Within Functional Limits for tasks assessed                                        General Comments      Exercises     Assessment/Plan    PT Assessment Patient needs continued PT services  PT Problem List Decreased strength;Decreased activity tolerance;Decreased balance;Decreased mobility       PT Treatment Interventions Therapeutic exercise;Gait training;Stair training;Functional mobility training;Therapeutic activities;Patient/family education    PT Goals (Current goals can be found in the Care Plan section)  Acute Rehab PT Goals Patient Stated Goal: return home with family to assist PT Goal Formulation: With patient Time For Goal Achievement: 12/30/18 Potential to Achieve Goals: Good    Frequency Min 3X/week   Barriers to discharge        Co-evaluation               AM-PAC PT "6 Clicks" Mobility  Outcome Measure Help needed turning from your back to your side while in a flat bed without using bedrails?: None Help needed moving from lying on your back to sitting on the side of a flat bed without using bedrails?: None Help needed moving to and from a bed to a chair (including a wheelchair)?: None Help needed standing up from a chair using your arms (e.g., wheelchair or bedside chair)?: None Help needed to walk in hospital room?: A Little Help needed climbing 3-5 steps with a railing? : A Little 6 Click Score: 22    End of Session   Activity Tolerance: Patient tolerated treatment well;Patient limited by fatigue Patient left: in chair;with call bell/phone within reach Nurse Communication: Mobility status PT Visit Diagnosis: Unsteadiness on feet (R26.81);Other abnormalities of gait and mobility (R26.89);Muscle weakness (generalized) (M62.81)    Time: 0315-9458 PT Time Calculation (min) (ACUTE ONLY): 20 min   Charges:   PT Evaluation $PT Eval Moderate Complexity: 1 Mod PT Treatments $Gait Training: 8-22 mins        2:11 PM,  12/25/18 Lonell Grandchild, MPT Physical Therapist with [facility] [phone] office [phone] mobile phone

## 2018-12-25 NOTE — Progress Notes (Signed)
OT Screen Note  Patient Details Name: Carol Hardin MRN: [id number] DOB: [birth year]   Cancelled Treatment:    Reason Eval/Treat Not Completed: OT screened, no needs identified, will sign off. OT orders received. Patient's chart reviewed. PT recently evaluated patient today and assessed that patient is functioning at Modified independent level. She lives with her spouse and was modified independent at home with all the necessary DME. No further need for OT services at this time. Thank you for the referral.    Ailene Ravel, OTR/L,CBIS  [phone]  12/25/2018, 3:29 PM

## 2018-12-25 NOTE — Plan of Care (Signed)
  Problem: Acute Rehab PT Goals(only PT should resolve) Goal: Patient Will Transfer Sit To/From Stand Outcome: Progressing Flowsheets (Taken 12/25/2018 1417) Patient will transfer sit to/from stand: with modified independence Goal: Pt Will Transfer Bed To Chair/Chair To Bed Outcome: Progressing Flowsheets (Taken 12/25/2018 1417) Pt will Transfer Bed to Chair/Chair to Bed: with modified independence Goal: Pt Will Ambulate Outcome: Progressing Flowsheets (Taken 12/25/2018 1417) Pt will Ambulate: > 125 feet; with modified independence; with cane   2:18 PM, 12/25/18 Lonell Grandchild, MPT Physical Therapist with [facility] [phone] office [phone] mobile phone

## 2018-12-25 NOTE — Consult Note (Addendum)
Cardiology Consultation:   Patient ID: Carol Hardin MRN: [id number]; DOB: [birth year]  Admit date: 12/24/2018 Date of Consult: 12/25/2018  Primary Care Provider: Celene Squibb, MD Primary Cardiologist: Carlyle Dolly, MD   Primary Electrophysiologist:  Thompson Grayer, MD     Patient Profile:   Carol Hardin is a 85 y.o. female with a hx of tachybradycardia syndrome with atrial fibrillation who is being seen today for the evaluation of acute on chronic systolic CHF at the request of Dr. Carles Collet  History of Present Illness:   Carol Hardin with chronic systolic heart failure ejection fraction 40 to 45% on echo 11/2018.  She is also developed tacky bradycardia syndrome with atrial fibrillation.  She was seen by Dr. Rayann Heman in the A. fib clinic who recommended admission for Tikosyn therapy.  She wanted to hold off until she completed some dental work.  Admission in January with atrial fibrillation in the setting of a pneumonia with increased heart rates temporarily on higher doses of AV nodal agents but weaned back to her lower dose at discharge and sent home with as needed diltiazem.  She has had significant bradycardia on very low-dose beta-blocker in the past.  She is on Eliquis for CHA2DS2-VASc equal to 6.  She now presents with acute on chronic systolic CHF.  Patient admits to eating out at [facility] and other fast foods as well as drinking a lot of Ensure.  She had sudden dyspnea and orthopnea without palpitations.  Past Medical History:  Diagnosis Date  . Anxiety   . Arthritis    "fingers, right toe" (08/09/2016)  . Basal cell carcinoma of lower leg, right   . Bradycardia    a. Holter 08/30/16 showed profound bradycardia down to 30 during awake hours, several 2 second pauses, very frequent PVCs >8000 in 48 hours, and NSVT (longest of 14 beats).  . Cardiomyopathy ([place])    a. EF 40% by echo 08/2016.  . Daily headache    "I usually wake up w/a headache; sometimes it's a migraine"  (08/09/2016)  . Depression   . Diverticulosis    Hx. of  . Frequent PVCs   . GERD (gastroesophageal reflux disease)   . Hypercholesterolemia   . Migraine   . Mitral regurgitation   . MVP (mitral valve prolapse)   . NSVT (nonsustained ventricular tachycardia) ([place])    a. first noted event monitor 08/2016.  . Osteoporosis   . PAF (paroxysmal atrial fibrillation) ([place])    a. in afib at time of echo 08/2016.  Marland Kitchen Pneumonia   . Scoliosis    mild  . Squamous cell carcinoma of neck     Past Surgical History:  Procedure Laterality Date  . BASAL CELL CARCINOMA EXCISION Right    RLE  . BLEPHAROPLASTY    . BREAST BIOPSY Left   . BREAST CYST ASPIRATION Left   . BREAST CYST EXCISION Left   . BUNIONECTOMY Bilateral   . DILATION AND CURETTAGE OF UTERUS    . EXCISIONAL HEMORRHOIDECTOMY    . INTRAMEDULLARY (IM) NAIL INTERTROCHANTERIC Left 12/31/2017   Procedure: INTRAMEDULLARY (IM) NAIL INTERTROCHANTRIC;  Surgeon: Nicholes Stairs, MD;  Location: [facility];  Service: Orthopedics;  Laterality: Left;  . KNEE ARTHROSCOPY Right 04/12/2007  . KNEE ARTHROSCOPY Left   . SQUAMOUS CELL CARCINOMA EXCISION     "neck"  . TONSILLECTOMY       Home Medications:  Prior to Admission medications   Medication Sig Start Date End Date Taking? Authorizing Provider  ammonium lactate (LAC-HYDRIN) 12 % lotion Apply 1 application topically 2 (two) times daily as needed for dry skin.   Yes [provider]  apixaban (ELIQUIS) 5 MG TABS tablet Take 1 tablet (5 mg total) by mouth 2 (two) times daily. 11/03/18  Yes Hardin, Carol Rinks, MD  bisoprolol (ZEBETA) 5 MG tablet Take 2.5 mg by mouth 2 (two) times daily.    Yes [provider]  diltiazem (CARDIZEM) 30 MG tablet Take 1 tablet (30 mg total) by mouth every 12 (twelve) hours as needed (palpitations). 11/22/18  Yes Shah, Pratik D, DO  Emollient ([place] [employer] HAND & BODY) LOTN Apply 1 application topically daily as needed.   Yes [provider]   feeding supplement, ENSURE ENLIVE, (ENSURE ENLIVE) LIQD Take 237 mLs by mouth 2 (two) times daily between meals. Patient taking differently: Take 237 mLs by mouth daily.  11/22/18  Yes Shah, Pratik D, DO  LORazepam (ATIVAN) 1 MG tablet Take 1 tablet (1 mg total) at bedtime by mouth. 1  qhs  Half  qam Patient taking differently: Take 1 mg by mouth at bedtime.  08/17/17  Yes Plovsky, Berneta Sages, MD  polyethylene glycol (MIRALAX / GLYCOLAX) packet Take 17 g by mouth daily as needed for mild constipation.    Yes [provider]    Inpatient Medications: Scheduled Meds: . apixaban  5 mg Oral BID  . bisoprolol  2.5 mg Oral BID  . feeding supplement (ENSURE ENLIVE)  237 mL Oral BID BM  . LORazepam  1 mg Oral QHS  . sodium chloride flush  3 mL Intravenous Q12H   Continuous Infusions: . sodium chloride     PRN Meds: sodium chloride, acetaminophen, ammonium lactate, diltiazem, ondansetron (ZOFRAN) IV, polyethylene glycol, sodium chloride flush  Allergies:    Allergies  Allergen Reactions  . Omeprazole Other (See Comments)    siezure  . Flagyl [Metronidazole] Nausea And Vomiting  . Aripiprazole Other (See Comments)    Unknown reaction  . Hylan G-F 20 Other (See Comments)    Unknown reaction  . Paroxetine Hcl Other (See Comments)    Headaches (a long time ago- pt doesn't really remember)  . Statins Other (See Comments)    No specific reaction given-patient states that she was advised not to take by physician  . Sulfa Antibiotics Diarrhea and Other (See Comments)    Colitis   . Toprol Xl [Metoprolol Succinate] Other (See Comments)    Dizziness--pt doesn't really remember   . Vancomycin Itching and Other (See Comments)    Pt reports that they gave it too fast- she started having itching in the scalp  . Penicillins Rash    DID THE REACTION INVOLVE: Swelling of the face/tongue/throat, SOB, or low BP? No Sudden or severe rash/hives, skin peeling, or the inside of the mouth or nose? Yes  Did it require medical treatment? Unknown When did it last happen?Over 10 years If all above answers are "NO", may proceed with cephalosporin use.     Social History:   Social History   Socioeconomic History  . Marital status: Married    Spouse name: Not on file  . Number of children: Not on file  . Years of education: Not on file  . Highest education level: Not on file  Occupational History  . Not on file  Social Needs  . Financial resource strain: Somewhat hard  . Food insecurity:    Worry: Never true    Inability: Never true  . Transportation needs:  Medical: No    Non-medical: No  Tobacco Use  . Smoking status: Former Smoker    Packs/day: 1.00    Years: 20.00    Pack years: 20.00    Types: Cigarettes    Last attempt to quit: 1989    Years since quitting: 31.2  . Smokeless tobacco: Never Used  Substance and Sexual Activity  . Alcohol use: No  . Drug use: No  . Sexual activity: Not Currently  Lifestyle  . Physical activity:    Days per week: 0 days    Minutes per session: 0 min  . Stress: Rather much  Relationships  . Social connections:    Talks on phone: Once a week    Gets together: Once a week    Attends religious service: Never    Active member of club or organization: No    Attends meetings of clubs or organizations: Never    Relationship status: Married  . Intimate partner violence:    Fear of current or ex partner: No    Emotionally abused: No    Physically abused: No    Forced sexual activity: No  Other Topics Concern  . Not on file  Social History Narrative  . Not on file    Family History:    Family History  Problem Relation Age of Onset  . Heart failure Mother   . Leukemia Mother   . Heart failure Father      ROS:  Please see the history of present illness.  Review of Systems  Constitution: Negative.  HENT: Negative.   Eyes: Negative.   Cardiovascular: Positive for dyspnea on exertion.  Respiratory: Positive for  shortness of breath.   Hematologic/Lymphatic: Negative.   Musculoskeletal: Negative.  Negative for joint pain.  Gastrointestinal: Negative.   Genitourinary: Negative.   Neurological: Negative.   Psychiatric/Behavioral: The patient is nervous/anxious.     All other ROS reviewed and negative.     Physical Exam/Data:   Vitals:   12/25/18 0545 12/25/18 0700 12/25/18 0800 12/25/18 0900  BP: 104/62  103/83   Pulse:  (!) 34 66 (!) 44  Resp: (!) 28 (!) 22 18 (!) 23  Temp:      TempSrc:      SpO2: 94% 97% 93% 93%  Weight:      Height:        Intake/Output Summary (Last 24 hours) at 12/25/2018 0938 Last data filed at 12/25/2018 0834 Gross per 24 hour  Intake -  Output 3485 ml  Net -3485 ml   Last 3 Weights 12/25/2018 12/24/2018 12/08/2018  Weight (lbs) 138 lb 3.7 oz 143 lb 143 lb  Weight (kg) 62.7 kg 64.864 kg 64.864 kg  Some encounter information is confidential and restricted. Go to Review Flowsheets activity to see all data.     Body mass index is 23 kg/m.  General:  Well nourished, well developed, in no acute distress  HEENT: normal Lymph: no adenopathy Neck: no JVD Endocrine:  No thryomegaly Vascular: No carotid bruits; FA pulses 2+ bilaterally without bruits  Cardiac:  normal S1, S2; irreg irreg; no murmur   Lungs: Decreased breath sounds but clear to auscultation bilaterally, no wheezing, rhonchi or rales  Abd: soft, nontender, no hepatomegaly  Ext: no edema Musculoskeletal:  No deformities, BUE and BLE strength normal and equal Skin: warm and dry  Neuro:  CNs 2-12 intact, no focal abnormalities noted Psych:  Normal affect   EKG:  The EKG was personally reviewed and demonstrates:  Atrial fibrillation at 124 bpm Telemetry:  Telemetry was personally reviewed and demonstrates: Atrial fibrillation at 102 bpm  Relevant CV Studies:  11/2018 echo IMPRESSIONS      1. The left ventricle has mild-moderately reduced systolic function of 59-93%. The cavity size was normal.  There is concentric left ventricular hypertrophy. Left ventricular diastology could not be evaluated secondary to atrial fibrillation. Elevated  left ventricular end-diastolic pressure Left ventricular diffuse hypokinesis.  2. The right ventricle has moderately reduced systolic function. The cavity was mildly enlarged. There is no increase in right ventricular wall thickness.  3. Left atrial size was mildly dilated.  4. Right atrial size was moderately dilated.  5. Moderate pleural effusion in the left lateral region.  6. The mitral valve is myxomatous. There is mild thickening. Mitral valve regurgitation is moderate by color flow Doppler.  7. The tricuspid valve is normal in structure.  8. The aortic valve is tricuspid Aortic valve regurgitation is mild by color flow Doppler.  9. There is dilatation of the aortic root. 10. The inferior vena cava was dilated in size with >50% respiratory variability.     Laboratory Data:  Chemistry Recent Labs  Lab 12/24/18 1020 12/25/18 0508  NA 139 140  K 4.1 3.5  CL 109 106  CO2 21* 24  GLUCOSE 125* 82  BUN 20 20  CREATININE 1.07* 1.17*  CALCIUM 9.1 8.9  GFRNONAA 48* 43*  GFRAA 56* 50*  ANIONGAP 9 10    No results for input(s): PROT, ALBUMIN, AST, ALT, ALKPHOS, BILITOT in the last 168 hours. Hematology Recent Labs  Lab 12/24/18 1020  WBC 7.4  RBC 4.25  HGB 13.3  HCT 41.9  MCV 98.6  MCH 31.3  MCHC 31.7  RDW 14.8  PLT 205   Cardiac Enzymes Recent Labs  Lab 12/24/18 1020  TROPONINI <0.03   No results for input(s): TROPIPOC in the last 168 hours.  BNP Recent Labs  Lab 12/24/18 1020  BNP 1,248.0*    DDimer No results for input(s): DDIMER in the last 168 hours.  Radiology/Studies:  Dg Chest Portable 1 View  Result Date: 12/24/2018 CLINICAL DATA:  Patient with productive cough.  Shortness of breath. EXAM: PORTABLE CHEST 1 VIEW COMPARISON:  Chest radiograph 11/20/2018 FINDINGS: Monitoring leads overlie the patient. Stable  cardiomegaly. Small left pleural effusion with underlying opacities. No pneumothorax. Thoracic spine degenerative changes. IMPRESSION: Small left pleural effusion with underlying opacities which may represent atelectasis or infection. Electronically Signed   By: Lovey Newcomer M.D.   On: 12/24/2018 11:14    Assessment and Plan:   1.  Acute on chronic systolic CHF BNP 5701, has diuresed 3.4 L since admission, now compensated.  She was eating fast food but also had some rapid heart rate on admission.  Discussed importance of 2 g sodium diet. 2. Atrial fibrillation with tachybrady syndrome-slow HR on very low dose meds. Patient seen by Dr. Rayann Heman in Afib clinic who recommended admission to start Tikosyn but patient wanted to hold off until dental surgery complete.  She now just needs fitted for her permanent crown.  We will have her contact the A. fib clinic after discharge. 3. CKD stage 3-creatinine up to 1.17 with diuresis.      For questions or updates, please contact [place] Please consult [email or web address] for contact info under     Signed, Ermalinda Barrios, PA-C  12/25/2018 9:38 AM   Pt seen and examined    I agree with findings as noted by  Gerrianne Scale above   Since she saw her she says she was quite dyspneic when she walked with PT    In bed sats 92-96%   HR 90s to 110s  Difficult    COuld continue to give IV lasix    Await input from PT WIll review with afib clinic re Tikosyn initiation   That cannot be done here   WOuld need to go to [facility]  I do think volume needs to be optimized first  I would give additional lasix today  Replete K and check MG    Dorris Carnes

## 2018-12-25 NOTE — Progress Notes (Signed)
Heart Failure booklet provided. Educated PT on HF, meds, s/s, and how to manage heart failure at home. Continue to reiterate education.

## 2018-12-25 NOTE — Telephone Encounter (Signed)
will be in touch with patient for tikosyn admission once elective admissions are permissible. Left message informing patient's husband - will be in touch in a few weeks. Pt instructed to call if issues with AF arise prior to admit.

## 2018-12-25 NOTE — Progress Notes (Signed)
PROGRESS NOTE  Carol Hardin PPJ:[id number] DOB: [birth year] DOA: 12/24/2018 PCP: Celene Squibb, MD  Brief History:  85 year old female with a history of tachybradycardia syndrome,Chronic atrial fibrillation, chronic atrial fibrillation, generalized anxiety disorder presenting with 1 week history of nonproductive cough and shortness of breath.  The patient denied any fevers, chills, hemoptysis, recent travels or sick contacts.  She has noted increasing lower extremity edema.  She had a recent admission from 11/18/2018 through 11/22/2018 during which she was treated for pneumonia and tachybradycardia syndrome.  Her discharge weight was 138.2 pounds.  The patient followed up in Dr. Nelly Laurence office.  It was noted that her weight at that time was 145 pounds.  The patient was felt to be euvolemic.  The patient endorses compliance with her medications at home.  Upon presentation, the patient was noted to have unremarkable BMP and CBC.  Chest x-ray showed increased interstitial markings.  The patient was started on IV furosemide.  Cardiology was consulted to assist with management.  Assessment/Plan: Acute on chronic systolic CHF -The patient has received 2 doses of furosemide 40 mg IV -Hold off further dosing as the patient appears clinically euvolemic -A.m. 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  Tachybradycardia syndrome with atrial fibrillation -Cardiology consult -Continue bisoprolol -Further adjustment per cardiology -Continue apixaban  Atypical chest pain -Troponin negative x1 -Personally reviewed EKG--atrial fibrillation, nonspecific T wave changes  Generalized anxiety disorder -Continue home dose Ativan  CKD stage III -Baseline creatinine 0.9-1.1     Disposition Plan:   Home in 1-2 days  Family Communication:   No Family at bedside  Consultants:   cardiology  Code Status:  FULL   DVT Prophylaxis:  apixaban   Procedures: As Listed in Progress Note Above  Antibiotics: None       Subjective: Patient denies fevers, chills, headache,dyspnea, nausea, vomiting, diarrhea, abdominal pain, dysuria, hematuria, hematochezia, and melena.  She complains of pleuritic left cp, worse with inspiration, but better than yesterday.   Objective: Vitals:   12/25/18 0336 12/25/18 0545 12/25/18 0700 12/25/18 0800  BP: 91/68 104/62  103/83  Pulse: (!) 104  (!) 34 66  Resp: 13 (!) 28 (!) 22 18  Temp: 98.8 F (37.1 C)     TempSrc: Oral     SpO2: 98% 94% 97% 93%  Weight:      Height: 5\' 5"  (1.651 m)       Intake/Output Summary (Last 24 hours) at 12/25/2018 5809 Last data filed at 12/25/2018 0645 Gross per 24 hour  Intake -  Output 2685 ml  Net -2685 ml   Weight change:  Exam:   General:  Pt is alert, follows commands appropriately, not in acute distress  HEENT: No icterus, No thrush, No neck mass, [place]/AT  Cardiovascular: RRR, S1/S2, no rubs, no gallops  Respiratory: bibasilar crackles  Abdomen: Soft/+BS, non tender, non distended, no guarding  Extremities: No edema, No lymphangitis, No petechiae, No rashes, no synovitis   Data Reviewed: I have personally reviewed following labs and imaging studies Basic Metabolic Panel: Recent Labs  Lab 12/24/18 1020 12/25/18 0508  NA 139 140  K 4.1 3.5  CL 109 106  CO2 21* 24  GLUCOSE 125* 82  BUN 20 20  CREATININE 1.07* 1.17*  CALCIUM 9.1 8.9   Liver Function Tests: No results for input(s): AST, ALT, ALKPHOS, BILITOT,  PROT, ALBUMIN in the last 168 hours. No results for input(s): LIPASE, AMYLASE in the last 168 hours. No results for input(s): AMMONIA in the last 168 hours. Coagulation Profile: No results for input(s): INR, PROTIME in the last 168 hours. CBC: Recent Labs  Lab 12/24/18 1020  WBC 7.4  NEUTROABS 4.0  HGB 13.3  HCT 41.9  MCV 98.6  PLT 205   Cardiac  Enzymes: Recent Labs  Lab 12/24/18 1020  TROPONINI <0.03   BNP: Invalid input(s): POCBNP CBG: No results for input(s): GLUCAP in the last 168 hours. HbA1C: No results for input(s): HGBA1C in the last 72 hours. Urine analysis:    Component Value Date/Time   COLORURINE COLORLESS (A) 12/24/2018 1650   APPEARANCEUR CLEAR 12/24/2018 1650   LABSPEC 1.004 (L) 12/24/2018 1650   PHURINE 7.0 12/24/2018 1650   GLUCOSEU NEGATIVE 12/24/2018 1650   HGBUR NEGATIVE 12/24/2018 1650   BILIRUBINUR NEGATIVE 12/24/2018 1650   KETONESUR NEGATIVE 12/24/2018 1650   PROTEINUR NEGATIVE 12/24/2018 1650   UROBILINOGEN 0.2 08/16/2008 2359   NITRITE NEGATIVE 12/24/2018 1650   LEUKOCYTESUR NEGATIVE 12/24/2018 1650   Sepsis Labs: @LABRCNTIP (procalcitonin:4,lacticidven:4) ) Recent Results (from the past 240 hour(s))  MRSA PCR Screening     Status: None   Collection Time: 12/25/18  3:21 AM  Result Value Ref Range Status   MRSA by PCR NEGATIVE NEGATIVE Final    Comment:        The GeneXpert MRSA Assay (FDA approved for NASAL specimens only), is one component of a comprehensive MRSA colonization surveillance program. It is not intended to diagnose MRSA infection nor to guide or monitor treatment for MRSA infections. Performed at [facility], [address], [place], [place] [address]      Scheduled Meds: . apixaban  5 mg Oral BID  . bisoprolol  2.5 mg Oral BID  . feeding supplement (ENSURE ENLIVE)  237 mL Oral BID BM  . LORazepam  1 mg Oral QHS  . sodium chloride flush  3 mL Intravenous Q12H   Continuous Infusions: . sodium chloride      Procedures/Studies: Dg Chest Portable 1 View  Result Date: 12/24/2018 CLINICAL DATA:  Patient with productive cough.  Shortness of breath. EXAM: PORTABLE CHEST 1 VIEW COMPARISON:  Chest radiograph 11/20/2018 FINDINGS: Monitoring leads overlie the patient. Stable cardiomegaly. Small left pleural effusion with underlying opacities. No pneumothorax. Thoracic  spine degenerative changes. IMPRESSION: Small left pleural effusion with underlying opacities which may represent atelectasis or infection. Electronically Signed   By: Lovey Newcomer M.D.   On: 12/24/2018 11:14    Orson Eva, DO  [facility] Pager [phone]  If 7PM-7AM, please contact night-coverage [email or web address] Password [email or web address] 12/25/2018, 8:21 AM   LOS: 1 day

## 2018-12-26 ENCOUNTER — Other Ambulatory Visit: Payer: Self-pay | Admitting: Cardiology

## 2018-12-26 DIAGNOSIS — E876 Hypokalemia: Secondary | ICD-10-CM

## 2018-12-26 DIAGNOSIS — I9589 Other hypotension: Secondary | ICD-10-CM

## 2018-12-26 DIAGNOSIS — I482 Chronic atrial fibrillation, unspecified: Secondary | ICD-10-CM

## 2018-12-26 DIAGNOSIS — N183 Chronic kidney disease, stage 3 (moderate): Secondary | ICD-10-CM

## 2018-12-26 DIAGNOSIS — I495 Sick sinus syndrome: Secondary | ICD-10-CM

## 2018-12-26 DIAGNOSIS — F411 Generalized anxiety disorder: Secondary | ICD-10-CM

## 2018-12-26 LAB — BASIC METABOLIC PANEL
Anion gap: 11 (ref 5–15)
BUN: 24 mg/dL — AB (ref 8–23)
CO2: 21 mmol/L — AB (ref 22–32)
Calcium: 8.9 mg/dL (ref 8.9–10.3)
Chloride: 107 mmol/L (ref 98–111)
Creatinine, Ser: 1.23 mg/dL — ABNORMAL HIGH (ref 0.44–1.00)
GFR calc Af Amer: 47 mL/min — ABNORMAL LOW (ref >60)
GFR calc non Af Amer: 41 mL/min — ABNORMAL LOW (ref >60)
Glucose, Bld: 101 mg/dL — ABNORMAL HIGH (ref 70–99)
Potassium: 3.2 mmol/L — ABNORMAL LOW (ref 3.5–5.1)
Sodium: 139 mmol/L (ref 135–145)

## 2018-12-26 MED ORDER — POTASSIUM CHLORIDE CRYS ER 20 MEQ PO TBCR
20.0000 meq | EXTENDED_RELEASE_TABLET | Freq: Once | ORAL | Status: AC
  Administered 2018-12-26: 20 meq via ORAL
  Filled 2018-12-26: qty 1

## 2018-12-26 MED ORDER — AMIODARONE HCL IN DEXTROSE 360-4.14 MG/200ML-% IV SOLN
30.0000 mg/h | INTRAVENOUS | Status: DC
  Administered 2018-12-27 – 2018-12-28 (×3): 30 mg/h via INTRAVENOUS
  Filled 2018-12-26 (×4): qty 200

## 2018-12-26 MED ORDER — AMIODARONE HCL IN DEXTROSE 360-4.14 MG/200ML-% IV SOLN
60.0000 mg/h | INTRAVENOUS | Status: AC
  Administered 2018-12-26 (×2): 60 mg/h via INTRAVENOUS
  Filled 2018-12-26: qty 200

## 2018-12-26 MED ORDER — BISOPROLOL FUMARATE 5 MG PO TABS: 2.5000 mg | ORAL_TABLET | Freq: Two times a day (BID) | ORAL | 1 refills | Status: DC

## 2018-12-26 MED ORDER — POTASSIUM CHLORIDE CRYS ER 20 MEQ PO TBCR
40.0000 meq | EXTENDED_RELEASE_TABLET | Freq: Once | ORAL | Status: AC
  Administered 2018-12-26: 40 meq via ORAL
  Filled 2018-12-26: qty 2

## 2018-12-26 MED ORDER — AMIODARONE LOAD VIA INFUSION
150.0000 mg | Freq: Once | INTRAVENOUS | Status: AC
  Administered 2018-12-26: 150 mg via INTRAVENOUS
  Filled 2018-12-26: qty 83.34

## 2018-12-26 NOTE — Progress Notes (Addendum)
Progress Note  Patient Name: Carol Hardin Date of Encounter: 12/26/2018  Primary Cardiologist: Carlyle Dolly, MD   Subjective   Ambulated around the ICU and experienced fatigue and dyspnea. Denies any chest pain or palpitations.  Inpatient Medications    Scheduled Meds: . apixaban  5 mg Oral BID  . bisoprolol  2.5 mg Oral BID  . feeding supplement (ENSURE ENLIVE)  237 mL Oral BID BM  . LORazepam  1 mg Oral QHS  . potassium chloride  20 mEq Oral Once  . sodium chloride flush  3 mL Intravenous Q12H   Continuous Infusions: . sodium chloride     PRN Meds: sodium chloride, acetaminophen, ammonium lactate, diltiazem, ondansetron (ZOFRAN) IV, polyethylene glycol, sodium chloride flush   Vital Signs    Vitals:   12/26/18 0802 12/26/18 0831 12/26/18 1010 12/26/18 1011  BP:   (!) 91/57 (!) 98/54  Pulse: 87 90    Resp:   19   Temp:      TempSrc:      SpO2:      Weight:      Height:        Intake/Output Summary (Last 24 hours) at 12/26/2018 1135 Last data filed at 12/26/2018 1017 Gross per 24 hour  Intake 366 ml  Output 1150 ml  Net -784 ml    Last 3 Weights 12/26/2018 12/25/2018 12/24/2018  Weight (lbs) 136 lb 14.5 oz 138 lb 3.7 oz 143 lb  Weight (kg) 62.1 kg 62.7 kg 64.864 kg  Some encounter information is confidential and restricted. Go to Review Flowsheets activity to see all data.      Telemetry    Atrial fibrillation, HR in 90's to 130's.   - Personally Reviewed  ECG    No new tracings.   Physical Exam   General: Well developed, well nourished Caucasian female appearing in no acute distress. Head: Normocephalic, atraumatic.  Neck: Supple without bruits, JVD not elevated. Lungs:  Resp regular and unlabored, CTA without wheezing or rales. Heart: Irregularly irregular, tachycardiac. S1, S2, no S3, S4, or murmur; no rub. Abdomen: Soft, non-tender, non-distended with normoactive bowel sounds. No hepatomegaly. No rebound/guarding. No obvious abdominal  masses. Extremities: No clubbing, cyanosis, or lower extremity edema. Distal pedal pulses are 2+ bilaterally. Neuro: Alert and oriented X 3. Moves all extremities spontaneously. Psych: Normal affect.  Labs    Chemistry Recent Labs  Lab 12/24/18 1020 12/25/18 0508 12/26/18 0533  NA 139 140 139  K 4.1 3.5 3.2*  CL 109 106 107  CO2 21* 24 21*  GLUCOSE 125* 82 101*  BUN 20 20 24*  CREATININE 1.07* 1.17* 1.23*  CALCIUM 9.1 8.9 8.9  GFRNONAA 48* 43* 41*  GFRAA 56* 50* 47*  ANIONGAP 9 10 11      Hematology Recent Labs  Lab 12/24/18 1020  WBC 7.4  RBC 4.25  HGB 13.3  HCT 41.9  MCV 98.6  MCH 31.3  MCHC 31.7  RDW 14.8  PLT 205    Cardiac Enzymes Recent Labs  Lab 12/24/18 1020  TROPONINI <0.03   No results for input(s): TROPIPOC in the last 168 hours.   BNP Recent Labs  Lab 12/24/18 1020  BNP 1,248.0*     DDimer No results for input(s): DDIMER in the last 168 hours.   Radiology    No results found.  Cardiac Studies   Echocardiogram: 11/19/2018 IMPRESSIONS   1. The left ventricle has mild-moderately reduced systolic function of 21-19%. The cavity size was normal.  There is concentric left ventricular hypertrophy. Left ventricular diastology could not be evaluated secondary to atrial fibrillation. Elevated  left ventricular end-diastolic pressure Left ventricular diffuse hypokinesis.  2. The right ventricle has moderately reduced systolic function. The cavity was mildly enlarged. There is no increase in right ventricular wall thickness.  3. Left atrial size was mildly dilated.  4. Right atrial size was moderately dilated.  5. Moderate pleural effusion in the left lateral region.  6. The mitral valve is myxomatous. There is mild thickening. Mitral valve regurgitation is moderate by color flow Doppler.  7. The tricuspid valve is normal in structure.  8. The aortic valve is tricuspid Aortic valve regurgitation is mild by color flow Doppler.  9. There is  dilatation of the aortic root. 10. The inferior vena cava was dilated in size with >50% respiratory variability.  Patient Profile     85 y.o. female w/ PMH of chronic systolic CHF (EF 97-02% by echo in 11/2018), atrial fibrillation with tachy-brady syndrome, and HLD who presented to [facility] on 12/24/2018 for evaluation of dyspnea and productive cough. Admitted with CHF exacerbation and Cardiology consulted to assist with management.   Assessment & Plan    1. Acute on Chronic Systolic CHF Exacerbation - BNP elevated to 1248 on admission and CXR showed a small left pleural effusion with underlying opacities thought to represent atelectasis or infection.  - has received 3 doses of IV Lasix 40mg  thus far this admission with a recorded output of -4.5L thus far and weight having declined by 7 lbs. Was 145 lbs at the time of her office visit in 11/2018, now at 136 lbs. Volume status appears close to baseline. Suspect she will require a low-dose diuretic such as 20 mg daily upon discharge given her reduced EF and tachycardia.    2. Persistent Atrial Fibrillation with Tachy-brady Syndrome - was evaluated by Dr. Rayann Heman as an outpatient who recommended Tikosyn admission but the patient wished to hold off until after she had dental surgery performed.  - rates remain variable in the 90's to 130's but given SBP in the 80's to 90's unable to further titrate Bisoprolol. The patient says the [facility] already reached out to her and said Tikosyn admissions are delayed for 4 weeks due to Coronavirus. Given her elevated rates and low BP, would consider initiation of Amiodarone. TSH and LFT's WNL in 11/2018. - continue Eliquis 5mg  BID for anticoagulation.   3. Stage 3 CKD - baseline creatinine 1.1 - 1.2. Stable at 1.23 this AM.   4. Hypokalemia - K+ 3.2 this AM. Replacement has been ordered by the admitting team.   For questions or updates, please contact [place] Please consult  [email or web address] for contact info under Cardiology/STEMI.   Signed, Erma Heritage , PA-C 11:35 AM 12/26/2018 Pager: [phone]  The patient was seen and examined, and I agree with the history, physical exam, assessment and plan as documented above.  She has exertional dyspnea but denies chest pain and palpitations.  Heart rate was in the 120 bpm range at the time of my evaluation.  She appears euvolemic.  She received a dose of IV Lasix yesterday.  She requires Tikosyn but admissions have been delayed for a month due to the coronavirus outbreak.  We will try loading her with IV amiodarone.  Bisoprolol will be discontinued.  She does have a history of tachycardia-bradycardia syndrome.  She is systemically anticoagulated with apixaban.   Kate Sable, MD, [facility]  12/26/2018 12:46  PM   

## 2018-12-26 NOTE — TOC Initial Note (Signed)
Transition of Care [facility]) - Initial/Assessment Note    Patient Details  Name: Carol Hardin MRN: [id number] Date of Birth: 03-10-1933  [facility]) CM/SW Contact:    Shade Flood, LCSW Phone Number: 12/26/2018, 11:51 AM  Clinical Narrative:                 PT recommending HH PT at dc. Met with pt and her husband to discuss. Also provided information on Reds vest through [facility]. Pt asks if [facility] rep can come and provide more information. Will ask Tim to come see pt. Will follow for transition of care needs.  Expected Discharge Plan: [place] Barriers to Discharge: No Barriers Identified   Patient Goals and CMS Choice Patient states their goals for this hospitalization and ongoing recovery are:: return home to prior level of functioning CMS Medicare.gov Compare Post Acute Care list provided to:: Patient Choice offered to / list presented to : Patient  Expected Discharge Plan and Services Expected Discharge Plan: [place] Choice: [place] arrangements for the past 2 months: Single Family Home                     HH Arranged: PT, RN [place] Agency: [facility] (now [facility])  Prior Living Arrangements/Services Living arrangements for the past 2 months: [place] with:: Spouse Patient language and need for interpreter reviewed:: Yes Do you feel safe going back to the place where you live?: Yes      Need for Family Participation in Patient Care: Yes (Comment) Care giver support system in place?: Yes (comment)   Criminal Activity/Legal Involvement Pertinent to Current Situation/Hospitalization: No - Comment as needed  Activities of Daily Living Home Assistive Devices/Equipment: Blood pressure cuff, Eyeglasses, Cane (specify quad or straight) ADL Screening (condition at time of admission) Patient's cognitive ability adequate to safely complete daily activities?: Yes Is  the patient deaf or have difficulty hearing?: No Does the patient have difficulty seeing, even when wearing glasses/contacts?: No Does the patient have difficulty concentrating, remembering, or making decisions?: No Patient able to express need for assistance with ADLs?: Yes Does the patient have difficulty dressing or bathing?: No Independently performs ADLs?: Yes (appropriate for developmental age) Does the patient have difficulty walking or climbing stairs?: Yes Weakness of Legs: Both Weakness of Arms/Hands: None  Permission Sought/Granted Permission sought to share information with : Investment banker, corporate granted to share info w AGENCY: [facility]        Emotional Assessment Appearance:: Appears stated age Attitude/Demeanor/Rapport: Engaged Affect (typically observed): Pleasant Orientation: : Oriented to Self, Oriented to Place, Oriented to  Time, Oriented to Situation Alcohol / Substance Use: Not Applicable Psych Involvement: No (comment)  Admission diagnosis:  Acute on chronic diastolic congestive heart failure (HCC) [I50.33] Atrial fibrillation, chronic [I48.20] Patient Active Problem List   Diagnosis Date Noted  . Acute on chronic systolic CHF (congestive heart failure) ([place]) 12/25/2018  . Tachy-brady syndrome ([employer]) 12/25/2018  . Atrial fibrillation, chronic 12/24/2018  . Congestive heart failure with left ventricular diastolic dysfunction, unspecified failure chronicity ([place]) 12/24/2018  . Malnutrition of moderate degree 11/20/2018  . Atrial fibrillation with rapid ventricular response ([place]) 11/18/2018  . GERD (gastroesophageal reflux disease) 11/18/2018  . CAP (community acquired pneumonia) 11/18/2018  . Acute on chronic diastolic HF (heart failure) ([place]) 11/18/2018  . Stroke (cerebrum) (  [place]) 05/19/2018  . Hip fracture ([place]) 12/31/2017  . PAF (paroxysmal atrial fibrillation) ([place])   . NSVT (nonsustained ventricular tachycardia) ([place])   .  Mitral regurgitation   . Frequent PVCs   . Bradycardia   . Essential hypertension   . Hypokalemia   . Diverticulitis 08/09/2016  . Diverticulosis of colon 04/22/2016  . Diverticulosis of large intestine without perforation or abscess without bleeding 04/22/2016  . Generalized anxiety disorder 04/22/2016  . Irritable bowel syndrome without diarrhea 04/22/2016  . Mitral valve prolapse 04/22/2016  . Mixed hyperlipidemia 04/22/2016  . Osteopenia 04/22/2016  . Low blood pressure 09/24/2014  . Hypercholesterolemia   . History of depression   . Scoliosis   . Osteoporosis   . DYSPNEA 05/31/2008   PCP:  Celene Squibb, MD Pharmacy:   [facility] [place], [place] AT [place] [address] [place] [address] Phone: [phone] Fax: [phone]     Social Determinants of Health (SDOH) Interventions    Readmission Risk Interventions 30 Day Unplanned Readmission Risk Score     ED to Hosp-Admission (Current) from 12/24/2018 in [place]  30 Day Unplanned Readmission Risk Score (%)  18 Filed at 12/26/2018 0801     This score is the patient's risk of an unplanned readmission within 30 days of being discharged (0 -100%). The score is based on dignosis, age, lab data, medications, orders, and past utilization.   Low:  0-14.9   Medium: 15-21.9   High: 22-29.9   Extreme: 30 and above       No flowsheet data found.

## 2018-12-26 NOTE — Telephone Encounter (Signed)
°*  STAT* If patient is at the pharmacy, call can be transferred to refill team.   1. Which medications need to be refilled? (please list name of each medication and dose if known) Bisoprolol fumarate 5mg  tablets once daily  2. Which pharmacy/location (including street and city if local pharmacy) is medication to be sent to?[facility] on [address] [place]  3. Do they need a 30 day or 90 day supply? [place]

## 2018-12-26 NOTE — Progress Notes (Signed)
PROGRESS NOTE  Carol Hardin YFV:[id number] DOB: 12/16/1932 DOA: 12/24/2018 PCP: Celene Squibb, MD  Brief History:  85 year old female with a history of tachybradycardia syndrome,Chronic atrial fibrillation, chronic atrial fibrillation, generalized anxiety disorder presenting with 1 week history of nonproductive cough and shortness of breath.  The patient denied any fevers, chills, hemoptysis, recent travels or sick contacts.  She has noted increasing lower extremity edema.  She had a recent admission from 11/18/2018 through 11/22/2018 during which she was treated for pneumonia and tachybradycardia syndrome.  Her discharge weight was 138.2 pounds.  The patient followed up in Dr. Nelly Laurence office.  It was noted that her weight at that time was 145 pounds.  The patient was felt to be euvolemic.  The patient endorses compliance with her medications at home.  Upon presentation, the patient was noted to have unremarkable BMP and CBC.  Chest x-ray showed increased interstitial markings.  The patient was started on IV furosemide.  Cardiology was consulted to assist with management.  Assessment/Plan: Acute on chronic systolic CHF -The patient has received 3 doses of furosemide 40 mg IV -Hold off further dosing as the patient appears clinically euvolemic -A.m. 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  Tachybradycardia syndrome with atrial fibrillation -Cardiology consult -Discontinue bisoprolol due to hypotension -12/26/18--started amiodarone drip -Continue apixaban  Atypical chest pain -Troponin negative x1 -Personally reviewed EKG--atrial fibrillation, nonspecific T wave changes -presently pain free  Generalized anxiety disorder -Continue home dose Ativan  CKD stage III -Baseline creatinine  0.9-1.1 -am BMP     Disposition Plan:   Home in 1-2 days  Family Communication:   spouse updated at bedside 3/17  Consultants:  cardiology  Code Status:  FULL   DVT Prophylaxis:  apixaban   Procedures: As Listed in Progress Note Above  Antibiotics: None      Subjective: Pt anxious about amiodarone.  Denies cp, n/v/d, abd pain.  No f/c, cough  Objective: Vitals:   12/26/18 1445 12/26/18 1511 12/26/18 1515 12/26/18 1600  BP: (!) 82/71 (!) 86/72 102/89   Pulse:  (!) 101 90   Resp: 17 13 (!) 22   Temp:    (!) 97.2 F (36.2 C)  TempSrc:    Oral  SpO2:  96% 94%   Weight:      Height:        Intake/Output Summary (Last 24 hours) at 12/26/2018 1618 Last data filed at 12/26/2018 1017 Gross per 24 hour  Intake 366 ml  Output 350 ml  Net 16 ml   Weight change: -2.764 kg Exam:   General:  Pt is alert, follows commands appropriately, not in acute distress  HEENT: No icterus, No thrush, No neck mass, [place]/AT  Cardiovascular: RRR, S1/S2, no rubs, no gallops  Respiratory: bibasilar crackles, no wheeze  Abdomen: Soft/+BS, non tender, non distended, no guarding  Extremities: No edema, No lymphangitis, No petechiae, No rashes, no synovitis   Data Reviewed: I have personally reviewed following labs and imaging studies Basic Metabolic Panel: Recent Labs  Lab 12/24/18 1020 12/25/18 0508 12/26/18 0533  NA 139 140 139  K 4.1 3.5 3.2*  CL 109 106 107  CO2 21* 24 21*  GLUCOSE 125* 82 101*  BUN 20 20 24*  CREATININE 1.07* 1.17* 1.23*  CALCIUM 9.1  8.9 8.9  MG  --  2.0  --    Liver Function Tests: No results for input(s): AST, ALT, ALKPHOS, BILITOT, PROT, ALBUMIN in the last 168 hours. No results for input(s): LIPASE, AMYLASE in the last 168 hours. No results for input(s): AMMONIA in the last 168 hours. Coagulation Profile: No results for input(s): INR, PROTIME in the last 168 hours. CBC: Recent Labs  Lab 12/24/18 1020  WBC 7.4  NEUTROABS 4.0   HGB 13.3  HCT 41.9  MCV 98.6  PLT 205   Cardiac Enzymes: Recent Labs  Lab 12/24/18 1020  TROPONINI <0.03   BNP: Invalid input(s): POCBNP CBG: No results for input(s): GLUCAP in the last 168 hours. HbA1C: No results for input(s): HGBA1C in the last 72 hours. Urine analysis:    Component Value Date/Time   COLORURINE COLORLESS (A) 12/24/2018 1650   APPEARANCEUR CLEAR 12/24/2018 1650   LABSPEC 1.004 (L) 12/24/2018 1650   PHURINE 7.0 12/24/2018 1650   GLUCOSEU NEGATIVE 12/24/2018 1650   HGBUR NEGATIVE 12/24/2018 1650   BILIRUBINUR NEGATIVE 12/24/2018 1650   KETONESUR NEGATIVE 12/24/2018 1650   PROTEINUR NEGATIVE 12/24/2018 1650   UROBILINOGEN 0.2 08/16/2008 2359   NITRITE NEGATIVE 12/24/2018 1650   LEUKOCYTESUR NEGATIVE 12/24/2018 1650   Sepsis Labs: @LABRCNTIP (procalcitonin:4,lacticidven:4) ) Recent Results (from the past 240 hour(s))  MRSA PCR Screening     Status: None   Collection Time: 12/25/18  3:21 AM  Result Value Ref Range Status   MRSA by PCR NEGATIVE NEGATIVE Final    Comment:        The GeneXpert MRSA Assay (FDA approved for NASAL specimens only), is one component of a comprehensive MRSA colonization surveillance program. It is not intended to diagnose MRSA infection nor to guide or monitor treatment for MRSA infections. Performed at [facility], [address] [place], [place] [address]      Scheduled Meds: . apixaban  5 mg Oral BID  . feeding supplement (ENSURE ENLIVE)  237 mL Oral BID BM  . LORazepam  1 mg Oral QHS  . sodium chloride flush  3 mL Intravenous Q12H   Continuous Infusions: . sodium chloride    . amiodarone 60 mg/hr (12/26/18 1403)   Followed by  . amiodarone      Procedures/Studies: Dg Chest Portable 1 View  Result Date: 12/24/2018 CLINICAL DATA:  Patient with productive cough.  Shortness of breath. EXAM: PORTABLE CHEST 1 VIEW COMPARISON:  Chest radiograph 11/20/2018 FINDINGS: Monitoring leads overlie the patient. Stable  cardiomegaly. Small left pleural effusion with underlying opacities. No pneumothorax. Thoracic spine degenerative changes. IMPRESSION: Small left pleural effusion with underlying opacities which may represent atelectasis or infection. Electronically Signed   By: Lovey Newcomer M.D.   On: 12/24/2018 11:14    Orson Eva, DO  [facility] Pager [phone]  If 7PM-7AM, please contact night-coverage [email or web address] Password [email or web address] 12/26/2018, 4:18 PM   LOS: 2 days

## 2018-12-26 NOTE — Progress Notes (Signed)
Physical Therapy Treatment Patient Details Name: Carol Hardin MRN: [id number] DOB: 1933-08-09 Today's Date: 12/26/2018    History of Present Illness [name] is a 85 y.o. female with medical history significant for chronic atrial fibrillation on Eliquis anticoagulation, cardiomyopathy, GERD depression diverticulosis basal cell carcinoma anxiety malnutrition and recent pneumonia who presented emergency department today with shortness of breath and occasional productive cough she says symptoms have been present for 1 week probably more than that because she was admitted last month for pneumonia.  She also complains of intermittent sharp stabbing pain to her left breast area and unable to take a deep breath fully.    PT Comments    Patient presents up in bedside chair, spouse at bedside, nurse tech reporting pt just finished wash up with supv assist and agreeable to therapy. Pt tolerates ambulation with SPC demonstrating improved balance, no staggering or loss of balance noted, but pt with cautious step progression and decreased cadence. Pt denies pain, chest pain, or shortness of breath throughout session, but reports "I just feel a little tired". Pt tolerates seated BLE strengthening exercises with demonstration and verbal cues. Pt's HR elevated to 153 during gait training and seated LAQ exercises, but pt denies symptoms. RN notified of pt's HR throughout treatment session. Pt left up in chair, spouse at bedside and RN in room administering medication. Patient will benefit from continued physical therapy in hospital and recommended venue below to increase strength, balance, endurance for safe ADLs and gait.    Follow Up Recommendations  Home health PT     Equipment Recommendations  None recommended by PT    Recommendations for Other Services       Precautions / Restrictions Precautions Precautions: Fall Restrictions Weight Bearing Restrictions: No    Mobility  Bed  Mobility               General bed mobility comments: pt seated in bedside chair upon arrival  Transfers   Equipment used: None;Straight cane Transfers: Sit to/from Stand Sit to Stand: Supervision         General transfer comment: proper hand placement, no unsteadiness noted, increased time/cautious movement  Ambulation/Gait Ambulation/Gait assistance: Supervision Gait Distance (Feet): 120 Feet Assistive device: Straight cane Gait Pattern/deviations: Decreased step length - right;Decreased step length - left;Decreased stride length Gait velocity: decreased   General Gait Details: Pt with decreased heel-toe pattern, no staggering noted, cautious step progression, no loss of balance, mild shortness of breath noted, and HR 120-153 during ambulation   Stairs             Wheelchair Mobility    Modified Rankin (Stroke Patients Only)       Balance Overall balance assessment: Needs assistance Sitting-balance support: Feet supported;No upper extremity supported Sitting balance-Leahy Scale: Good Sitting balance - Comments: seated in chair   Standing balance support: During functional activity;Single extremity supported Standing balance-Leahy Scale: Fair Standing balance comment: with SPC                            Cognition Arousal/Alertness: Awake/alert Behavior During Therapy: WFL for tasks assessed/performed Overall Cognitive Status: Within Functional Limits for tasks assessed                                        Exercises General Exercises - Lower Extremity Long Arc Quad: Seated;AROM;Strengthening;Both;10  reps Hip Flexion/Marching: Seated;AROM;Strengthening;Both;10 reps(HR elevated to 153) Toe Raises: Seated;AROM;Strengthening;Both;10 reps Heel Raises: Seated;AROM;Strengthening;Both;10 reps    General Comments        Pertinent Vitals/Pain Pain Assessment: No/denies pain    Home Living                       Prior Function            PT Goals (current goals can now be found in the care plan section) Acute Rehab PT Goals Patient Stated Goal: return home with family to assist PT Goal Formulation: With patient Time For Goal Achievement: 12/30/18 Potential to Achieve Goals: Good Progress towards PT goals: Progressing toward goals    Frequency    Min 3X/week      PT Plan Current plan remains appropriate    Co-evaluation              AM-PAC PT "6 Clicks" Mobility   Outcome Measure  Help needed turning from your back to your side while in a flat bed without using bedrails?: None Help needed moving from lying on your back to sitting on the side of a flat bed without using bedrails?: None Help needed moving to and from a bed to a chair (including a wheelchair)?: None Help needed standing up from a chair using your arms (e.g., wheelchair or bedside chair)?: None Help needed to walk in hospital room?: A Little Help needed climbing 3-5 steps with a railing? : A Little 6 Click Score: 22    End of Session Equipment Utilized During Treatment: Gait belt Activity Tolerance: Patient tolerated treatment well;Patient limited by fatigue Patient left: in chair;with call bell/phone within reach;with nursing/sitter in room;with family/visitor present(RN providing medication and spouse at bedside) Nurse Communication: Mobility status PT Visit Diagnosis: Unsteadiness on feet (R26.81);Other abnormalities of gait and mobility (R26.89);Muscle weakness (generalized) (M62.81)     Time: 0950-1010 PT Time Calculation (min) (ACUTE ONLY): 20 min  Charges:  $Therapeutic Exercise: 8-22 mins                     11:19 AM, 12/26/18 Talbot Grumbling, DPT Physical Therapist with [facility] [phone] office

## 2018-12-26 NOTE — Telephone Encounter (Signed)
Medication sent to pharmacy  

## 2018-12-27 ENCOUNTER — Ambulatory Visit: Payer: Self-pay | Admitting: Cardiology

## 2018-12-27 DIAGNOSIS — R109 Unspecified abdominal pain: Secondary | ICD-10-CM

## 2018-12-27 DIAGNOSIS — K219 Gastro-esophageal reflux disease without esophagitis: Secondary | ICD-10-CM

## 2018-12-27 LAB — BASIC METABOLIC PANEL
Anion gap: 10 (ref 5–15)
BUN: 35 mg/dL — ABNORMAL HIGH (ref 8–23)
CO2: 24 mmol/L (ref 22–32)
Calcium: 9.5 mg/dL (ref 8.9–10.3)
Chloride: 103 mmol/L (ref 98–111)
Creatinine, Ser: 1.34 mg/dL — ABNORMAL HIGH (ref 0.44–1.00)
GFR calc Af Amer: 42 mL/min — ABNORMAL LOW (ref >60)
GFR, EST NON AFRICAN AMERICAN: 37 mL/min — AB (ref >60)
Glucose, Bld: 117 mg/dL — ABNORMAL HIGH (ref 70–99)
Potassium: 4.2 mmol/L (ref 3.5–5.1)
Sodium: 137 mmol/L (ref 135–145)

## 2018-12-27 LAB — MAGNESIUM: Magnesium: 2.2 mg/dL (ref 1.7–2.4)

## 2018-12-27 MED ORDER — ALUM & MAG HYDROXIDE-SIMETH 200-200-20 MG/5ML PO SUSP
30.0000 mL | Freq: Four times a day (QID) | ORAL | Status: DC | PRN
  Administered 2018-12-27: 30 mL via ORAL
  Filled 2018-12-27: qty 30

## 2018-12-27 MED ORDER — FUROSEMIDE 20 MG PO TABS
20.0000 mg | ORAL_TABLET | Freq: Every day | ORAL | Status: DC
  Administered 2018-12-28 – 2018-12-29 (×2): 20 mg via ORAL
  Filled 2018-12-27 (×2): qty 1

## 2018-12-27 MED ORDER — FAMOTIDINE 20 MG PO TABS
10.0000 mg | ORAL_TABLET | Freq: Every day | ORAL | Status: DC
  Administered 2018-12-27 – 2018-12-29 (×3): 10 mg via ORAL
  Filled 2018-12-27 (×3): qty 1

## 2018-12-27 NOTE — Progress Notes (Signed)
Pt IV site on assessment was red and tender to the touch. I expressed to the patient the need for a new site and she agreed. IV was removed and new IV started. Applied a heat pack to old site on left forearm. Pt was satisfied.

## 2018-12-27 NOTE — Progress Notes (Signed)
Progress Note  Patient Name: Carol Hardin Date of Encounter: 12/27/2018  Primary Cardiologist: Carlyle Dolly, MD   Subjective   She complains of mild abdominal discomfort and GERD.  Inpatient Medications    Scheduled Meds: . apixaban  5 mg Oral BID  . feeding supplement (ENSURE ENLIVE)  237 mL Oral BID BM  . LORazepam  1 mg Oral QHS  . sodium chloride flush  3 mL Intravenous Q12H   Continuous Infusions: . sodium chloride    . amiodarone 30 mg/hr (12/27/18 0100)   PRN Meds: sodium chloride, acetaminophen, ammonium lactate, ondansetron (ZOFRAN) IV, polyethylene glycol, sodium chloride flush   Vital Signs    Vitals:   12/27/18 0742 12/27/18 0800 12/27/18 0830 12/27/18 0930  BP:  (!) 80/66 93/66 (!) 88/74  Pulse:  (!) 59 (!) 48 91  Resp:  (!) 23 19 13   Temp: 97.8 F (36.6 C)     TempSrc: Oral     SpO2:  97% 100% 98%  Weight:      Height:        Intake/Output Summary (Last 24 hours) at 12/27/2018 1124 Last data filed at 12/27/2018 0946 Gross per 24 hour  Intake 266.34 ml  Output 600 ml  Net -333.66 ml   Filed Weights   12/25/18 0335 12/26/18 0500 12/27/18 0500  Weight: 62.7 kg 62.1 kg 62.3 kg    Telemetry    Atrial fibrillation, HR 90-loww 100 bpm range, 6 beats of NSVT - Personally Reviewed  ECG    NA - Personally Reviewed  Physical Exam   GEN: No acute distress.   Neck: No JVD Cardiac: Irregular rhythm, no murmurs, rubs, or gallops.  Respiratory: Clear to auscultation bilaterally. GI: Soft, nontender, non-distended  MS: No edema; No deformity. Neuro:  Nonfocal  Psych: Normal affect   Labs    Chemistry Recent Labs  Lab 12/25/18 0508 12/26/18 0533 12/27/18 0402  NA 140 139 137  K 3.5 3.2* 4.2  CL 106 107 103  CO2 24 21* 24  GLUCOSE 82 101* 117*  BUN 20 24* 35*  CREATININE 1.17* 1.23* 1.34*  CALCIUM 8.9 8.9 9.5  GFRNONAA 43* 41* 37*  GFRAA 50* 47* 42*  ANIONGAP 10 11 10      Hematology Recent Labs  Lab 12/24/18 1020   WBC 7.4  RBC 4.25  HGB 13.3  HCT 41.9  MCV 98.6  MCH 31.3  MCHC 31.7  RDW 14.8  PLT 205    Cardiac Enzymes Recent Labs  Lab 12/24/18 1020  TROPONINI <0.03   No results for input(s): TROPIPOC in the last 168 hours.   BNP Recent Labs  Lab 12/24/18 1020  BNP 1,248.0*     DDimer No results for input(s): DDIMER in the last 168 hours.   Radiology    No results found.  Cardiac Studies   Echocardiogram: 11/19/2018 IMPRESSIONS  1. The left ventricle has mild-moderately reduced systolic function of 43-32%. The cavity size was normal. There is concentric left ventricular hypertrophy. Left ventricular diastology could not be evaluated secondary to atrial fibrillation. Elevated  left ventricular end-diastolic pressure Left ventricular diffuse hypokinesis. 2. The right ventricle has moderately reduced systolic function. The cavity was mildly enlarged. There is no increase in right ventricular wall thickness. 3. Left atrial size was mildly dilated. 4. Right atrial size was moderately dilated. 5. Moderate pleural effusion in the left lateral region. 6. The mitral valve is myxomatous. There is mild thickening. Mitral valve regurgitation is moderate by color  flow Doppler. 7. The tricuspid valve is normal in structure. 8. The aortic valve is tricuspid Aortic valve regurgitation is mild by color flow Doppler. 9. There is dilatation of the aortic root. 10. The inferior vena cava was dilated in size with >50% respiratory variability.  Patient Profile     85 y.o. female w/ PMH of chronic systolic CHF (EF 12-16% by echo in 11/2018), atrial fibrillation with tachy-brady syndrome, and HLD who presented to [facility] on 12/24/2018 for evaluation of dyspnea and productive cough. Admitted with CHF exacerbation and Cardiology consulted to assist with management.   Assessment & Plan    1. Acute on Chronic Systolic CHF Exacerbation: BNP elevated to 1248 on admission and CXR showed a  small left pleural effusion with underlying opacities thought to represent atelectasis or infection. Has put out nearly 600 cc in last 24+ hrs. Weight stable at 62.3 kg. Will start oral Lasix 20 mg daily on 3/19 given rise in creatinine today.  2. Persistent Atrial Fibrillation with Tachy-brady Syndrome: Was evaluated by Dr. Rayann Heman as an outpatient who recommended Tikosyn admission but the patient wished to hold off until after she had dental surgery performed.  I initiated IV amiodarone bolus + infusion on 3/17. HR's now in 90-low 100 bpm range. BP soft. Bisoprolol dc on 3/17. Hopefully she can be transitioned to oral amiodarone 200 mg bid on 3/19. The patient says the [facility] already reached out to her and said Tikosyn admissions are delayed for 4 weeks due to Coronavirus. TSH and LFT's WNL in 11/2018. Continue Eliquis 5mg  BID for anticoagulation.   3. Stage 3 CKD: Baseline creatinine 1.1 - 1.2. Up to 1.34 this AM. Will start oral Lasix 20 mg daily on 3/19 given rise in creatinine today.  4. Hypokalemia: K+ 4.2 this AM.   5. NSVT: K normal. Mg 2 on 3/16. I will recheck. On IV amiodarone.  6. GERD/abdominal discomfort: She is reportedly allergic to omeprazole. I will try famotidine.   For questions or updates, please contact [place] Please consult [email or web address] for contact info under Cardiology/STEMI.      Signed, Kate Sable, MD  12/27/2018, 11:24 AM

## 2018-12-27 NOTE — Progress Notes (Signed)
Physical Therapy Treatment Patient Details Name: Carol Hardin MRN: [id number] DOB: 1933-03-15 Today's Date: 12/27/2018    History of Present Illness Carol Hardin is a 85 y.o. female with medical history significant for chronic atrial fibrillation on Eliquis anticoagulation, cardiomyopathy, GERD depression diverticulosis basal cell carcinoma anxiety malnutrition and recent pneumonia who presented emergency department today with shortness of breath and occasional productive cough she says symptoms have been present for 1 week probably more than that because she was admitted last month for pneumonia.  She also complains of intermittent sharp stabbing pain to her left breast area and unable to take a deep breath fully.    PT Comments    Patient presents supine in bed and agreeable to therapy. Pt ambulates with SPC demonstrating staggering to  R/L during straight-line gait requiring min assist to prevent fall and recover appropriately. Pt's HR only elevated to 124 during ambulation and pt reports "I don't feel it racing" during gait training. Pt complains of "stomachache or reflux discomfort" today limiting treatment session. Pt left supine in bed with spouse at bedside and RN notified of reflux discomfort complaints. Patient will benefit from continued physical therapy in hospital and recommended venue below to increase strength, balance, endurance for safe ADLs and gait.    Follow Up Recommendations  Home health PT     Equipment Recommendations  None recommended by PT    Recommendations for Other Services       Precautions / Restrictions Precautions Precautions: Fall Restrictions Weight Bearing Restrictions: No    Mobility  Bed Mobility Overal bed mobility: Modified Independent             General bed mobility comments: increased time  Transfers Overall transfer level: Needs assistance Equipment used: None;Straight cane Transfers: Sit to/from Stand;Stand Pivot  Transfers Sit to Stand: Supervision         General transfer comment: initial unsteadiness improving with SPC  Ambulation/Gait Ambulation/Gait assistance: Min guard Gait Distance (Feet): 120 Feet Assistive device: Straight cane Gait Pattern/deviations: Decreased step length - right;Decreased step length - left;Decreased stride length;Staggering left;Staggering right;Narrow base of support Gait velocity: decreased   General Gait Details: Pt with decreased heel toe pattern, narrow base of support with occasional staggering to L/R in straightline gait, 3 LOB requiring min assist to prevent fall, HR elevated to 124 during ambulation   Stairs             Wheelchair Mobility    Modified Rankin (Stroke Patients Only)       Balance Overall balance assessment: Needs assistance Sitting-balance support: Feet supported;No upper extremity supported Sitting balance-Leahy Scale: Good Sitting balance - Comments: seated EOB   Standing balance support: During functional activity;Single extremity supported Standing balance-Leahy Scale: Fair Standing balance comment: with SPC                            Cognition Arousal/Alertness: Awake/alert Behavior During Therapy: WFL for tasks assessed/performed Overall Cognitive Status: Within Functional Limits for tasks assessed                                        Exercises      General Comments        Pertinent Vitals/Pain Faces Pain Scale: Hurts little more Pain Location: stomach Pain Descriptors / Indicators: Aching;Discomfort(Pt reports possible reflux sensation) Pain Intervention(s): Limited activity  within patient's tolerance;Monitored during session;Patient requesting pain meds-RN notified    Home Living                      Prior Function            PT Goals (current goals can now be found in the care plan section) Acute Rehab PT Goals Patient Stated Goal: return home with family  to assist PT Goal Formulation: With patient Time For Goal Achievement: 12/30/18 Potential to Achieve Goals: Good Progress towards PT goals: Progressing toward goals    Frequency    Min 3X/week      PT Plan Current plan remains appropriate    Co-evaluation              AM-PAC PT "6 Clicks" Mobility   Outcome Measure  Help needed turning from your back to your side while in a flat bed without using bedrails?: None Help needed moving from lying on your back to sitting on the side of a flat bed without using bedrails?: None Help needed moving to and from a bed to a chair (including a wheelchair)?: None Help needed standing up from a chair using your arms (e.g., wheelchair or bedside chair)?: None Help needed to walk in hospital room?: A Little Help needed climbing 3-5 steps with a railing? : A Little 6 Click Score: 22    End of Session   Activity Tolerance: Patient tolerated treatment well;Patient limited by fatigue Patient left: in bed;with call bell/phone within reach;with family/visitor present(Spouse at bedside) Nurse Communication: Mobility status PT Visit Diagnosis: Unsteadiness on feet (R26.81);Other abnormalities of gait and mobility (R26.89);Muscle weakness (generalized) (M62.81)     Time: 3383-2919 PT Time Calculation (min) (ACUTE ONLY): 14 min  Charges:  $Gait Training: 8-22 mins                     3:18 PM, 12/27/18 Talbot Grumbling, DPT Physical Therapist with [facility] [phone] office

## 2018-12-27 NOTE — Progress Notes (Signed)
PROGRESS NOTE  Carol Hardin ZCH:[id number] DOB: September 04, 1933 DOA: 12/24/2018 PCP: Celene Squibb, MD Brief History: 85 year old female with a history of tachybradycardia syndrome,Chronic atrial fibrillation,chronic atrial fibrillation, generalized anxiety disorder presenting with 1 week history of nonproductive cough and shortness of breath. The patient denied any fevers, chills, hemoptysis, recent travels or sick contacts. She has noted increasing lower extremity edema. She had a recent admission from 11/18/2018 through 11/22/2018 during which she was treated for pneumonia and tachybradycardia syndrome. Her discharge weight was 138.2pounds.The patient followed up in Dr. Nelly Laurence office. It was noted that her weight at that time was 145 pounds. The patient was felt to be euvolemic. The patient endorses compliance with her medications at home. Upon presentation, the patient was noted to have unremarkable BMP and CBC. Chest x-ray showed increased interstitial markings. The patient was started on IV furosemide. Cardiology was consulted to assist with management.  Assessment/Plan: Acute on chronic systolic CHF -The patient has received 3 doses of furosemide 40 mg IV -plan to restart po lasix 3/19 per cardiology -A.m. 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  Tachybradycardia syndrome with atrial fibrillation -Cardiology consult appreciated -Discontinue bisoprolol due to hypotension -12/26/18--started amiodarone drip>>>possible transition po amio 3/19 -Continue apixaban -Tikosyn admissions are delayed for 4 weeks due to Coronavirus  Atypical chest pain -Troponin negative x1 -Personally reviewed EKG--atrial fibrillation, nonspecific T wave changes -presently pain free  Generalized  anxiety disorder -Continue home dose Ativan  CKD stage III -Baseline creatinine 0.9-1.1 -am BMP     Disposition Plan: Home when cleared by cardiology Family Communication:spouse updated at bedside 3/18  Consultants:cardiology  Code Status: FULL   DVT Prophylaxis:apixaban   Procedures: As Listed in Progress Note Above  Antibiotics: None     Subjective: Patient denies fevers, chills, headache, chest pain, dyspnea, nausea, vomiting, diarrhea, abdominal pain, dysuria, hematuria, hematochezia, and melena.  She has some GERD symptoms   Objective: Vitals:   12/27/18 1430 12/27/18 1500 12/27/18 1503 12/27/18 1600  BP: 95/78 91/65    Pulse:  (!) 40 70   Resp: (!) 21 20    Temp:    (!) 97.5 F (36.4 C)  TempSrc:    Oral  SpO2:  100%    Weight:      Height:        Intake/Output Summary (Last 24 hours) at 12/27/2018 1742 Last data filed at 12/27/2018 0946 Gross per 24 hour  Intake 266.34 ml  Output 400 ml  Net -133.66 ml   Weight change: 0.2 kg Exam:   General:  Pt is alert, follows commands appropriately, not in acute distress  HEENT: No icterus, No thrush, No neck mass, [place]/AT  Cardiovascular: IRRR, S1/S2, no rubs, no gallops  Respiratory: bibasilar crackles, no wheeze  Abdomen: Soft/+BS, non tender, non distended, no guarding  Extremities: No edema, No lymphangitis, No petechiae, No rashes, no synovitis   Data Reviewed: I have personally reviewed following labs and imaging studies Basic Metabolic Panel: Recent Labs  Lab 12/24/18 1020 12/25/18 0508 12/26/18 0533 12/27/18 0402  NA 139 140 139 137  K 4.1 3.5 3.2* 4.2  CL 109 106 107 103  CO2 21* 24 21* 24  GLUCOSE 125* 82 101* 117*  BUN 20 20 24* 35*  CREATININE 1.07* 1.17* 1.23* 1.34*  CALCIUM 9.1 8.9 8.9 9.5  MG  --  2.0  --  2.2   Liver Function Tests: No results for input(s): AST, ALT, ALKPHOS, BILITOT, PROT, ALBUMIN in the last 168 hours. No results for input(s):  LIPASE, AMYLASE in the last 168 hours. No results for input(s): AMMONIA in the last 168 hours. Coagulation Profile: No results for input(s): INR, PROTIME in the last 168 hours. CBC: Recent Labs  Lab 12/24/18 1020  WBC 7.4  NEUTROABS 4.0  HGB 13.3  HCT 41.9  MCV 98.6  PLT 205   Cardiac Enzymes: Recent Labs  Lab 12/24/18 1020  TROPONINI <0.03   BNP: Invalid input(s): POCBNP CBG: No results for input(s): GLUCAP in the last 168 hours. HbA1C: No results for input(s): HGBA1C in the last 72 hours. Urine analysis:    Component Value Date/Time   COLORURINE COLORLESS (A) 12/24/2018 1650   APPEARANCEUR CLEAR 12/24/2018 1650   LABSPEC 1.004 (L) 12/24/2018 1650   PHURINE 7.0 12/24/2018 1650   GLUCOSEU NEGATIVE 12/24/2018 1650   HGBUR NEGATIVE 12/24/2018 1650   BILIRUBINUR NEGATIVE 12/24/2018 1650   KETONESUR NEGATIVE 12/24/2018 1650   PROTEINUR NEGATIVE 12/24/2018 1650   UROBILINOGEN 0.2 08/16/2008 2359   NITRITE NEGATIVE 12/24/2018 1650   LEUKOCYTESUR NEGATIVE 12/24/2018 1650   Sepsis Labs: @LABRCNTIP (procalcitonin:4,lacticidven:4) ) Recent Results (from the past 240 hour(s))  MRSA PCR Screening     Status: None   Collection Time: 12/25/18  3:21 AM  Result Value Ref Range Status   MRSA by PCR NEGATIVE NEGATIVE Final    Comment:        The GeneXpert MRSA Assay (FDA approved for NASAL specimens only), is one component of a comprehensive MRSA colonization surveillance program. It is not intended to diagnose MRSA infection nor to guide or monitor treatment for MRSA infections. Performed at [facility], [address], [place], [place] [address]      Scheduled Meds: . apixaban  5 mg Oral BID  . famotidine  10 mg Oral Daily  . feeding supplement (ENSURE ENLIVE)  237 mL Oral BID BM  . [START ON 12/28/2018] furosemide  20 mg Oral Daily  . LORazepam  1 mg Oral QHS  . sodium chloride flush  3 mL Intravenous Q12H   Continuous Infusions: . sodium chloride    .  amiodarone 30 mg/hr (12/27/18 1330)    Procedures/Studies: Dg Chest Portable 1 View  Result Date: 12/24/2018 CLINICAL DATA:  Patient with productive cough.  Shortness of breath. EXAM: PORTABLE CHEST 1 VIEW COMPARISON:  Chest radiograph 11/20/2018 FINDINGS: Monitoring leads overlie the patient. Stable cardiomegaly. Small left pleural effusion with underlying opacities. No pneumothorax. Thoracic spine degenerative changes. IMPRESSION: Small left pleural effusion with underlying opacities which may represent atelectasis or infection. Electronically Signed   By: Lovey Newcomer M.D.   On: 12/24/2018 11:14    Orson Eva, DO  [facility] Pager [phone]  If 7PM-7AM, please contact night-coverage [email or web address] Password [email or web address] 12/27/2018, 5:42 PM   LOS: 3 days

## 2018-12-28 DIAGNOSIS — I4819 Other persistent atrial fibrillation: Secondary | ICD-10-CM

## 2018-12-28 DIAGNOSIS — I5030 Unspecified diastolic (congestive) heart failure: Secondary | ICD-10-CM

## 2018-12-28 LAB — BASIC METABOLIC PANEL
Anion gap: 10 (ref 5–15)
BUN: 25 mg/dL — ABNORMAL HIGH (ref 8–23)
CO2: 23 mmol/L (ref 22–32)
Calcium: 9.2 mg/dL (ref 8.9–10.3)
Chloride: 104 mmol/L (ref 98–111)
Creatinine, Ser: 1.16 mg/dL — ABNORMAL HIGH (ref 0.44–1.00)
GFR calc non Af Amer: 44 mL/min — ABNORMAL LOW (ref >60)
GFR, EST AFRICAN AMERICAN: 50 mL/min — AB (ref >60)
Glucose, Bld: 112 mg/dL — ABNORMAL HIGH (ref 70–99)
Potassium: 3.7 mmol/L (ref 3.5–5.1)
Sodium: 137 mmol/L (ref 135–145)

## 2018-12-28 MED ORDER — AMIODARONE HCL 200 MG PO TABS
200.0000 mg | ORAL_TABLET | Freq: Two times a day (BID) | ORAL | Status: DC
  Administered 2018-12-28 – 2018-12-29 (×3): 200 mg via ORAL
  Filled 2018-12-28 (×3): qty 1

## 2018-12-28 MED ORDER — POTASSIUM CHLORIDE CRYS ER 20 MEQ PO TBCR
20.0000 meq | EXTENDED_RELEASE_TABLET | Freq: Once | ORAL | Status: AC
  Administered 2018-12-28: 20 meq via ORAL
  Filled 2018-12-28: qty 1

## 2018-12-28 MED ORDER — LORAZEPAM 1 MG PO TABS
1.5000 mg | ORAL_TABLET | Freq: Every day | ORAL | Status: DC
  Administered 2018-12-28: 1.5 mg via ORAL
  Filled 2018-12-28: qty 1

## 2018-12-28 NOTE — Progress Notes (Signed)
PROGRESS NOTE  Carol Hardin YWV:[id number] DOB: April 27, 1933 DOA: 12/24/2018 PCP: Celene Squibb, MD Brief History: 85 year old female with a history of tachybradycardia syndrome,Chronic atrial fibrillation,chronic atrial fibrillation, generalized anxiety disorder presenting with 1 week history of nonproductive cough and shortness of breath. The patient denied any fevers, chills, hemoptysis, recent travels or sick contacts. She has noted increasing lower extremity edema. She had a recent admission from 11/18/2018 through 11/22/2018 during which she was treated for pneumonia and tachybradycardia syndrome. Her discharge weight was 138.2pounds.The patient followed up in Dr. Nelly Laurence office. It was noted that her weight at that time was 145 pounds. The patient was felt to be euvolemic. The patient endorses compliance with her medications at home. Upon presentation, the patient was noted to have unremarkable BMP and CBC. Chest x-ray showed increased interstitial markings. The patient was started on IV furosemide. Cardiology was consulted to assist with management.  Assessment/Plan: Acute on chronic systolic CHF -Continue diuresis with Lasix -Daily weights--NEG 6 kg since admission -3/18 weight = 137.3 lbs -Accurate I's and O's--NEG 4.8L but incomplete -11/19/2018 echo EF 40-55%, diffuse HK, moderate decreased RV function, moderate MR with myxomatous mitral valve  Tachybradycardia syndrome with atrial fibrillation -Cardiology consult appreciated -Discontinue bisoprolol due to hypotension -12/26/18--started amiodarone drip>>> currently on oral amiodarone -Continue apixaban -Tikosyn admissions are delayed for 4 weeks due to Coronavirus patient heart rate continues to fluctuate quite a bit--- bradycardia/tachycardia patient states she can feel this tachycardia and bradycardia from time to time she is very symptomatic with fluctuations of a heart rate, , will monitor overnight and  adjust cardiac meds if needed   Atypical chest pain -Troponin negative x1 -Personally reviewed EKG--atrial fibrillation, nonspecific T wave changes -presently pain free  Generalized anxiety disorder---appears to be worse, increase lorazepam to 1.5 mg nightly  CKD stage III -Baseline creatinine 0.9-1.1 Avoid nephrotoxic agents  Dyspepsia--- complaints, including nausea and reflux, okay to use H2 blockers and Zofran    Disposition Plan: Patient and her husband  uncomfortable with discharge home at this time, patient heart rate continues to fluctuate quite a bit--- bradycardia/tachycardia patient states she can feel this tachycardia and bradycardia from time to time she is very symptomatic with fluctuations of a heart rate, , will monitor overnight and adjust cardiac meds if needed  Family Communication:spouse updated at bedside 3/19  Consultants:cardiology  Code Status: FULL   DVT Prophylaxis:apixaban   Procedures: As Listed in Progress Note Above  Antibiotics: None   Subjective: Husband at bedside, patient is very anxious, has multiple complaints and multiple concerns, including nausea, reflux, breathing and chest concerns, patient admits to anxiety disorder, apparently at times she cannot sleep at night prior to admission she was taking lorazepam 1 mg nightly after discussion with patient and husband and RN at bedside will increase Lorazepam to 1.5 mg nightly  Patient and her husband  uncomfortable with discharge home at this time   Objective: Vitals:   12/28/18 1433 12/28/18 1600 12/28/18 1650 12/28/18 1700  BP:  (!) 83/63  (!) 89/70  Pulse:  66 (!) 39 (!) 42  Resp: 19 13 15 17   Temp:   98.6 F (37 C)   TempSrc:   Oral   SpO2:  92% 94% 93%  Weight:      Height:        Intake/Output Summary (Last 24 hours) at 12/28/2018 1909 Last data filed at 12/28/2018 1400 Gross per 24 hour  Intake 3  ml  Output 575 ml  Net -572 ml   Weight change:  0.9 kg Exam:   General:  Pt is alert, follows commands appropriately, not in acute distress  HEENT: No icterus, No thrush, No neck mass, [place]/AT  Cardiovascular: IRRR, S1/S2, no rubs, no gallops  Respiratory: bibasilar crackles, no wheeze  Abdomen: Soft/+BS, non tender, non distended, no guarding  Extremities: No edema, No lymphangitis, No petechiae, No rashes, no synovitis Psych--- very anxious  Data Reviewed: I have personally reviewed following labs and imaging studies Basic Metabolic Panel: Recent Labs  Lab 12/24/18 1020 12/25/18 0508 12/26/18 0533 12/27/18 0402 12/28/18 0525  NA 139 140 139 137 137  K 4.1 3.5 3.2* 4.2 3.7  CL 109 106 107 103 104  CO2 21* 24 21* 24 23  GLUCOSE 125* 82 101* 117* 112*  BUN 20 20 24* 35* 25*  CREATININE 1.07* 1.17* 1.23* 1.34* 1.16*  CALCIUM 9.1 8.9 8.9 9.5 9.2  MG  --  2.0  --  2.2  --    Liver Function Tests: No results for input(s): AST, ALT, ALKPHOS, BILITOT, PROT, ALBUMIN in the last 168 hours. No results for input(s): LIPASE, AMYLASE in the last 168 hours. No results for input(s): AMMONIA in the last 168 hours. Coagulation Profile: No results for input(s): INR, PROTIME in the last 168 hours. CBC: Recent Labs  Lab 12/24/18 1020  WBC 7.4  NEUTROABS 4.0  HGB 13.3  HCT 41.9  MCV 98.6  PLT 205   Cardiac Enzymes: Recent Labs  Lab 12/24/18 1020  TROPONINI <0.03   BNP: Invalid input(s): POCBNP CBG: No results for input(s): GLUCAP in the last 168 hours. HbA1C: No results for input(s): HGBA1C in the last 72 hours. Urine analysis:    Component Value Date/Time   COLORURINE COLORLESS (A) 12/24/2018 1650   APPEARANCEUR CLEAR 12/24/2018 1650   LABSPEC 1.004 (L) 12/24/2018 1650   PHURINE 7.0 12/24/2018 1650   GLUCOSEU NEGATIVE 12/24/2018 1650   HGBUR NEGATIVE 12/24/2018 1650   BILIRUBINUR NEGATIVE 12/24/2018 1650   KETONESUR NEGATIVE 12/24/2018 1650   PROTEINUR NEGATIVE 12/24/2018 1650   UROBILINOGEN 0.2 08/16/2008  2359   NITRITE NEGATIVE 12/24/2018 1650   LEUKOCYTESUR NEGATIVE 12/24/2018 1650   Sepsis Labs: @LABRCNTIP (procalcitonin:4,lacticidven:4) ) Recent Results (from the past 240 hour(s))  MRSA PCR Screening     Status: None   Collection Time: 12/25/18  3:21 AM  Result Value Ref Range Status   MRSA by PCR NEGATIVE NEGATIVE Final    Comment:        The GeneXpert MRSA Assay (FDA approved for NASAL specimens only), is one component of a comprehensive MRSA colonization surveillance program. It is not intended to diagnose MRSA infection nor to guide or monitor treatment for MRSA infections. Performed at [facility], [address] [place],  [address]      Scheduled Meds: . amiodarone  200 mg Oral BID  . apixaban  5 mg Oral BID  . famotidine  10 mg Oral Daily  . feeding supplement (ENSURE ENLIVE)  237 mL Oral BID BM  . furosemide  20 mg Oral Daily  . LORazepam  1.5 mg Oral QHS  . sodium chloride flush  3 mL Intravenous Q12H   Continuous Infusions: . sodium chloride      Procedures/Studies: Dg Chest Portable 1 View  Result Date: 12/24/2018 CLINICAL DATA:  Patient with productive cough.  Shortness of breath. EXAM: PORTABLE CHEST 1 VIEW COMPARISON:  Chest radiograph 11/20/2018 FINDINGS: Monitoring leads overlie the  patient. Stable cardiomegaly. Small left pleural effusion with underlying opacities. No pneumothorax. Thoracic spine degenerative changes. IMPRESSION: Small left pleural effusion with underlying opacities which may represent atelectasis or infection. Electronically Signed   By: Lovey Newcomer M.D.   On: 12/24/2018 11:14   NB!! patient heart rate continues to fluctuate quite a bit--- bradycardia/tachycardia patient states she can feel this tachycardia and bradycardia from time to time she is very symptomatic with fluctuations of a heart rate, , will monitor overnight and adjust cardiac meds if needed   Verdia Bolt,MD  [facility]  If 7PM-7AM, please  contact night-coverage [email or web address] Password [email or web address] 12/28/2018, 7:09 PM   LOS: 4 days

## 2018-12-28 NOTE — Progress Notes (Signed)
Physical Therapy Treatment Patient Details Name: Carol Hardin MRN: [id number] DOB: 31-May-1933 Today's Date: 12/28/2018    History of Present Illness Carol Hardin is a 85 y.o. female with medical history significant for chronic atrial fibrillation on Eliquis anticoagulation, cardiomyopathy, GERD depression diverticulosis basal cell carcinoma anxiety malnutrition and recent pneumonia who presented emergency department today with shortness of breath and occasional productive cough she says symptoms have been present for 1 week probably more than that because she was admitted last month for pneumonia.  She also complains of intermittent sharp stabbing pain to her left breast area and unable to take a deep breath fully.    PT Comments    Patient received semi-supine in bed and had not eaten breakfast. She was agreeable to participate in therapy. Patient is mod independent for bed mobility and required supervision for sit to stand transfer with no device. Patient slightly unsteady with no device and balance improved with 1 HHA from therapist. Patient maintained balance at sink to perform hygiene ADL's (face washing, brush teeth). She performed standing and forward, backward, and side step gait activities for ~ 14 minutes with no difficulty and denied symptoms of dizziness, nausea, SOB. Patient left in chair with tray table in front and breakfast reheated. Encouraged patient to eat to improve energy levels in order to participate in therapy and improve strength. Patient will benefit from skilled PT to address current impairments and from follow up PT at below venue to address mobility. Acute PT will follow.    Follow Up Recommendations  Home health PT     Equipment Recommendations  None recommended by PT    Recommendations for Other Services       Precautions / Restrictions Precautions Precautions: Fall Restrictions Weight Bearing Restrictions: No    Mobility  Bed Mobility Overal  bed mobility: Modified Independent        General bed mobility comments: increased time  Transfers Overall transfer level: Needs assistance Equipment used: None;Straight cane;1 person hand held assist Transfers: Sit to/from Stand;Stand Pivot Transfers Sit to Stand: Supervision Stand pivot transfers: Supervision       General transfer comment: initial unsteadiness, improve with handheld assistance for 1UE  Ambulation/Gait Ambulation/Gait assistance: Min guard   Assistive device: Straight cane Gait Pattern/deviations: Decreased step length - right;Decreased step length - left;Decreased stride length;Staggering left;Staggering right;Narrow base of support Gait velocity: decreased   General Gait Details: patient with narrow base of support and shuffling pattern with no AD, improved gait with reduced staggering when provided 1 HHA       Balance Overall balance assessment: Needs assistance Sitting-balance support: Feet supported;No upper extremity supported Sitting balance-Leahy Scale: Good Sitting balance - Comments: seated EOB   Standing balance support: During functional activity;Single extremity supported Standing balance-Leahy Scale: Fair Standing balance comment: with no AD and 1HHA       Cognition Arousal/Alertness: Awake/alert Behavior During Therapy: WFL for tasks assessed/performed Overall Cognitive Status: Within Functional Limits for tasks assessed              Pertinent Vitals/Pain Pain Assessment: No/denies pain           PT Goals (current goals can now be found in the care plan section) Acute Rehab PT Goals Patient Stated Goal: return home with family to assist PT Goal Formulation: With patient Time For Goal Achievement: 12/30/18 Potential to Achieve Goals: Good Progress towards PT goals: Progressing toward goals    Frequency    Min 3X/week  PT Plan Current plan remains appropriate       AM-PAC PT "6 Clicks" Mobility   Outcome  Measure  Help needed turning from your back to your side while in a flat bed without using bedrails?: None Help needed moving from lying on your back to sitting on the side of a flat bed without using bedrails?: None Help needed moving to and from a bed to a chair (including a wheelchair)?: None Help needed standing up from a chair using your arms (e.g., wheelchair or bedside chair)?: None Help needed to walk in hospital room?: A Little Help needed climbing 3-5 steps with a railing? : A Little 6 Click Score: 22    End of Session Equipment Utilized During Treatment: Gait belt Activity Tolerance: Patient tolerated treatment well;Patient limited by fatigue Patient left: with call bell/phone within reach;in chair;Other (comment)(with breakfast tray in front of her) Nurse Communication: Mobility status PT Visit Diagnosis: Unsteadiness on feet (R26.81);Other abnormalities of gait and mobility (R26.89);Muscle weakness (generalized) (M62.81)     Time: 9381-8299 PT Time Calculation (min) (ACUTE ONLY): 28 min  Charges:  $Therapeutic Activity: 23-37 mins                     Kipp Brood, PT, DPT, [facility] Physical Therapist with [facility]  12/28/2018 12:11 PM

## 2018-12-28 NOTE — Progress Notes (Addendum)
Progress Note  Patient Name: Carol Hardin Date of Encounter: 12/28/2018  Primary Cardiologist: Carlyle Dolly, MD   Subjective   Voicing multiple concerns today. She is unsure of why Dr. Harl Bowie has not been by to see her (discussed that he is in [place] and Dr. Bronson Ing and Dr. Harrington Challenger are covering at [facility] this week).   Says she has continued to experience abdominal discomfort and feels like she is belching frequently. No nausea or vomiting. Has been constipated.Takes Miralax regularly at home but does not want this currently.   No chest pain or palpitations. Breathing continues to improve.   Inpatient Medications    Scheduled Meds: . apixaban  5 mg Oral BID  . famotidine  10 mg Oral Daily  . feeding supplement (ENSURE ENLIVE)  237 mL Oral BID BM  . furosemide  20 mg Oral Daily  . LORazepam  1 mg Oral QHS  . sodium chloride flush  3 mL Intravenous Q12H   Continuous Infusions: . sodium chloride    . amiodarone 30 mg/hr (12/28/18 0023)   PRN Meds: sodium chloride, acetaminophen, alum & mag hydroxide-simeth, ammonium lactate, ondansetron (ZOFRAN) IV, polyethylene glycol, sodium chloride flush   Vital Signs    Vitals:   12/28/18 0300 12/28/18 0400 12/28/18 0500 12/28/18 0718  BP: (!) 83/68 (!) 81/68 (!) 76/63   Pulse: (!) 104 65 (!) 46 (!) 47  Resp: (!) 24 15 (!) 29 20  Temp:  97.7 F (36.5 C)  98.2 F (36.8 C)  TempSrc:  Oral  Oral  SpO2: 90% 92% 94% 91%  Weight:   63.2 kg   Height:        Intake/Output Summary (Last 24 hours) at 12/28/2018 0757 Last data filed at 12/27/2018 1841 Gross per 24 hour  Intake 69.88 ml  Output 300 ml  Net -230.12 ml    Last 3 Weights 12/28/2018 12/27/2018 12/26/2018  Weight (lbs) 139 lb 5.3 oz 137 lb 5.6 oz 136 lb 14.5 oz  Weight (kg) 63.2 kg 62.3 kg 62.1 kg  Some encounter information is confidential and restricted. Go to Review Flowsheets activity to see all data.      Telemetry    Atrial fibrillation, HR in 80's to 110's,  peaking in 120's at times. Occasional PVC's.  - Personally Reviewed  ECG    No new tracings.   Physical Exam   General: Well developed, well nourished Caucasian female appearing in no acute distress. Head: Normocephalic, atraumatic.  Neck: Supple without bruits, JVD not elevated. Lungs:  Resp regular and unlabored, CTA without wheezing or rales. Heart: Irregularly irregular, S1, S2, no S3, S4, or murmur; no rub. Abdomen: Soft, non-tender, non-distended with normoactive bowel sounds. No hepatomegaly. No rebound/guarding. No obvious abdominal masses. Extremities: No clubbing, cyanosis, or lower extremity edema. Distal pedal pulses are 2+ bilaterally. Neuro: Alert and oriented X 3. Moves all extremities spontaneously. Psych: Normal affect.  Labs    Chemistry Recent Labs  Lab 12/26/18 0533 12/27/18 0402 12/28/18 0525  NA 139 137 137  K 3.2* 4.2 3.7  CL 107 103 104  CO2 21* 24 23  GLUCOSE 101* 117* 112*  BUN 24* 35* 25*  CREATININE 1.23* 1.34* 1.16*  CALCIUM 8.9 9.5 9.2  GFRNONAA 41* 37* 44*  GFRAA 47* 42* 50*  ANIONGAP 11 10 10      Hematology Recent Labs  Lab 12/24/18 1020  WBC 7.4  RBC 4.25  HGB 13.3  HCT 41.9  MCV 98.6  MCH 31.3  MCHC 31.7  RDW 14.8  PLT 205    Cardiac Enzymes Recent Labs  Lab 12/24/18 1020  TROPONINI <0.03   No results for input(s): TROPIPOC in the last 168 hours.   BNP Recent Labs  Lab 12/24/18 1020  BNP 1,248.0*     DDimer No results for input(s): DDIMER in the last 168 hours.   Radiology    No results found.  Cardiac Studies   Echocardiogram: 11/19/2018 IMPRESSIONS  1. The left ventricle has mild-moderately reduced systolic function of 84-13%. The cavity size was normal. There is concentric left ventricular hypertrophy. Left ventricular diastology could not be evaluated secondary to atrial fibrillation. Elevated  left ventricular end-diastolic pressure Left ventricular diffuse hypokinesis.  2. The right ventricle has  moderately reduced systolic function. The cavity was mildly enlarged. There is no increase in right ventricular wall thickness.  3. Left atrial size was mildly dilated.  4. Right atrial size was moderately dilated.  5. Moderate pleural effusion in the left lateral region.  6. The mitral valve is myxomatous. There is mild thickening. Mitral valve regurgitation is moderate by color flow Doppler.  7. The tricuspid valve is normal in structure.  8. The aortic valve is tricuspid Aortic valve regurgitation is mild by color flow Doppler.  9. There is dilatation of the aortic root. 10. The inferior vena cava was dilated in size with >50% respiratory variability.  Patient Profile     85 y.o. female w/ PMH of chronic systolic CHF (EF 24-40% by echo in 11/2018), atrial fibrillation with tachy-brady syndrome, and HLD who presented to [facility] on 12/24/2018 for evaluation of dyspnea and productive cough. Admitted with CHF exacerbation and Cardiology consulted to assist with management.   Assessment & Plan    1. Acute on Chronic Systolic CHF Exacerbation - BNP elevated to 1248 on admission and CXR showed a small left pleural effusion with underlying opacities thought to represent atelectasis or infection.  - initially receiving IV Lasix with a recorded output of -5.1L thus far. Weights have been variable but at 137 -139 lbs the past few days, was at 145 lbs during her office visit in 11/2018, now at 136 lbs. Volume status appears close to baseline. Given the bump in her creatinine yesterday, she has been switched to PO. Scheduled to start PO Lasix 20mg  today (was not on diuretic therapy PTA).  - PTA Bisoprolol currently held given hypotension. Not on ACE-I/ARB/ARNI for the same. Can hopefully restart low-dose Bisoprolol 2.5mg  BID prior to discharge if BP allows.   2. Persistent Atrial Fibrillation with Tachy-brady Syndrome - was evaluated by Dr. Rayann Heman as an outpatient who recommended Tikosyn admission but  the patient wished to hold off until after she had dental surgery performed. The patient says the [facility] already reached out to her and said Tikosyn admissions are delayed for 4 weeks due to Coronavirus.  - given her hypotension and elevated rates, the decision was made to initiate Amiodarone. Initiated on 12/26/2018. Rates have improved, variable in the 80's to 110's by review of telemetry. Will plan to switch to PO Amiodarone 200mg  BID today as outlined by prior notes.  - continue Eliquis 5mg  BID for anticoagulation.   3. Stage 3 CKD - baseline creatinine 1.1 - 1.2. Peaked at 1.34, now improved to 1.16 this AM.   4. Hypokalemia - K+ 3.7 this AM. Will try to keep ~ 4.0 given episodes of NSVT earlier this admission.    5. NSVT - had 6 beats  NSVT earlier this admission. Keep K+ ~ 4.0. Mg 2.2. Remains on Amiodarone as outlined above.   6. Abdominal Discomfort - reports constipation but refuses to take Miralax at this time. Was just switched to Pepcid for GERD. Will check LFT's and Lipase.  - further evaluation per admitting team.   For questions or updates, please contact [place] Please consult [email or web address] for contact info under Cardiology/STEMI.   Signed, Erma Heritage , PA-C 7:57 AM 12/28/2018 Pager: [phone]  Patient seen and exmained   I have reviewed case above with B Strader ON exam pt in bed BP 80s/    Lungs are rel clear  Cardiac exam:  Irreg irreg   No S3 Ext are without edema  I would keep on same regimen     Switch lasix to PO Make diet low Na OK to d/c from cardiac standpoint if ambulating   Keep on 200 bid amiodarone for now.  Dorris Carnes

## 2018-12-28 NOTE — Progress Notes (Signed)
Pt was tachycardic and admitted to feeling short of breath. Placed patient on 2L via [place]. She stated that she felt better. Will continue to assess patient.

## 2018-12-29 ENCOUNTER — Telehealth: Payer: Self-pay | Admitting: Cardiology

## 2018-12-29 LAB — COMPREHENSIVE METABOLIC PANEL
ALK PHOS: 49 U/L (ref 38–126)
ALT: 26 U/L (ref 0–44)
AST: 22 U/L (ref 15–41)
Albumin: 3.4 g/dL — ABNORMAL LOW (ref 3.5–5.0)
Anion gap: 7 (ref 5–15)
BUN: 29 mg/dL — ABNORMAL HIGH (ref 8–23)
CALCIUM: 8.8 mg/dL — AB (ref 8.9–10.3)
CO2: 25 mmol/L (ref 22–32)
CREATININE: 1.31 mg/dL — AB (ref 0.44–1.00)
Chloride: 104 mmol/L (ref 98–111)
GFR calc Af Amer: 44 mL/min — ABNORMAL LOW (ref >60)
GFR calc non Af Amer: 38 mL/min — ABNORMAL LOW (ref >60)
Glucose, Bld: 93 mg/dL (ref 70–99)
Potassium: 3.9 mmol/L (ref 3.5–5.1)
Sodium: 136 mmol/L (ref 135–145)
Total Bilirubin: 1.4 mg/dL — ABNORMAL HIGH (ref 0.3–1.2)
Total Protein: 6 g/dL — ABNORMAL LOW (ref 6.5–8.1)

## 2018-12-29 LAB — LIPASE, BLOOD: Lipase: 39 U/L (ref 11–51)

## 2018-12-29 MED ORDER — RANITIDINE HCL 300 MG PO TABS: 300.0000 mg | ORAL_TABLET | Freq: Every day | ORAL | 2 refills | Status: DC

## 2018-12-29 MED ORDER — FUROSEMIDE 20 MG PO TABS: 20.0000 mg | ORAL_TABLET | ORAL | 0 refills | Status: DC

## 2018-12-29 MED ORDER — BUSPIRONE HCL 5 MG PO TABS: 5.0000 mg | ORAL_TABLET | Freq: Three times a day (TID) | ORAL | 2 refills | Status: DC

## 2018-12-29 MED ORDER — AMIODARONE HCL 200 MG PO TABS: 200.0000 mg | ORAL_TABLET | Freq: Two times a day (BID) | ORAL | 3 refills | Status: DC

## 2018-12-29 MED ORDER — ACETAMINOPHEN 325 MG PO TABS: 650.0000 mg | ORAL_TABLET | ORAL | 1 refills | Status: DC | PRN

## 2018-12-29 MED ORDER — ONDANSETRON 4 MG PO TBDP: 4.0000 mg | ORAL_TABLET | Freq: Three times a day (TID) | ORAL | 0 refills | Status: DC | PRN

## 2018-12-29 NOTE — TOC Transition Note (Signed)
Transition of Care [facility]) - CM/SW Discharge Note   Patient Details  Name: Carol Hardin MRN: [id number] Date of Birth: Jun 25, 1933  [facility] CM/SW Contact:  Shade Flood, LCSW Phone Number: 12/29/2018, 11:24 AM   Clinical Narrative:   Pt to dc home today per MD. Updated Tim at [facility]. They will follow pt at home.     Final next level of care: [place] Barriers to Discharge: No Barriers Identified   Patient Goals and CMS Choice Patient states their goals for this hospitalization and ongoing recovery are:: return home to prior level of functioning CMS Medicare.gov Compare Post Acute Care list provided to:: Patient Choice offered to / list presented to : Patient  Discharge Placement                       Discharge Plan and Services     Post Acute Care Choice: Home Health              HH Arranged: PT, RN [facility] Agency: [facility] (now [facility])   Social Determinants of Health (SDOH) Interventions     Readmission Risk Interventions No flowsheet data found.

## 2018-12-29 NOTE — Progress Notes (Signed)
Patient received discharge orders and verbalized understanding. Husband at bedside to understand discharge orders also. One peripheral IV removed prior to discharge. Patient to be discharged to home with husband with home health.

## 2018-12-29 NOTE — Discharge Summary (Signed)
Carol Hardin, is a 85 y.o. female  DOB 06-26-1933  MRN [id number].  Admission date:  12/24/2018  Admitting Physician  Cristal Deer, MD  Discharge Date:  12/29/2018   Primary MD  Celene Squibb, MD  Recommendations for primary care physician for things to follow:   1)Very low-salt diet advised 2)Weigh yourself daily, call if you gain more than 3 pounds in 1 day or more than 5 pounds in 1 week as your diuretic medications may need to be adjusted 3)Limit your Fluid  intake to no more than 60 ounces (1.8 Liters) per day 4)You are taking apixaban/Eliquis for blood thinner so Avoid ibuprofen/Advil/Aleve/Motrin/Goody Powders/Naproxen/BC powders/Meloxicam/Diclofenac/Indomethacin and other Nonsteroidal anti-inflammatory medications as these will make you more likely to bleed and can cause stomach ulcers, can also cause Kidney problems.  5) follow-up with a cardiologist within a week for reevaluation and recheck   Admission Diagnosis  Acute on chronic diastolic congestive heart failure (HCC) [I50.33] Atrial fibrillation, chronic [I48.20]   Discharge Diagnosis  Acute on chronic diastolic congestive heart failure (HCC) [I50.33] Atrial fibrillation, chronic [I48.20]    Active Problems:   Low blood pressure   Generalized anxiety disorder   Mitral valve prolapse   PAF (paroxysmal atrial fibrillation) (HCC)   Atrial fibrillation with rapid ventricular response (HCC)   Atrial fibrillation, chronic   Congestive heart failure with left ventricular diastolic dysfunction, unspecified failure chronicity (HCC)   Acute on chronic systolic CHF (congestive heart failure) ([place])   Tachy-brady syndrome ([place])      Past Medical History:  Diagnosis Date  . Anxiety   . Arthritis    "fingers, right toe" (08/09/2016)  . Basal cell carcinoma of lower leg, right   . Bradycardia    a. Holter 08/30/16 showed profound  bradycardia down to 30 during awake hours, several 2 second pauses, very frequent PVCs >8000 in 48 hours, and NSVT (longest of 14 beats).  . Cardiomyopathy ([place])    a. EF 40% by echo 08/2016.  . Daily headache    "I usually wake up w/a headache; sometimes it's a migraine" (08/09/2016)  . Depression   . Diverticulosis    Hx. of  . Frequent PVCs   . GERD (gastroesophageal reflux disease)   . Hypercholesterolemia   . Migraine   . Mitral regurgitation   . MVP (mitral valve prolapse)   . NSVT (nonsustained ventricular tachycardia) ([place])    a. first noted event monitor 08/2016.  . Osteoporosis   . PAF (paroxysmal atrial fibrillation) ([place])    a. in afib at time of echo 08/2016.  Marland Kitchen Pneumonia   . Scoliosis    mild  . Squamous cell carcinoma of neck     Past Surgical History:  Procedure Laterality Date  . BASAL CELL CARCINOMA EXCISION Right    RLE  . BLEPHAROPLASTY    . BREAST BIOPSY Left   . BREAST CYST ASPIRATION Left   . BREAST CYST EXCISION Left   . BUNIONECTOMY Bilateral   . DILATION AND CURETTAGE OF  UTERUS    . EXCISIONAL HEMORRHOIDECTOMY    . INTRAMEDULLARY (IM) NAIL INTERTROCHANTERIC Left 12/31/2017   Procedure: INTRAMEDULLARY (IM) NAIL INTERTROCHANTRIC;  Surgeon: Nicholes Stairs, MD;  Location: [facility];  Service: Orthopedics;  Laterality: Left;  . KNEE ARTHROSCOPY Right 04/12/2007  . KNEE ARTHROSCOPY Left   . SQUAMOUS CELL CARCINOMA EXCISION     "neck"  . TONSILLECTOMY         HPI  from the history and physical done on the day of admission:   Chief Complaint: Shortness of breath  HPI: Carol Hardin is a 85 y.o. female with medical history significant for chronic atrial fibrillation on Eliquis anticoagulation, cardiomyopathy, GERD depression diverticulosis basal cell carcinoma anxiety malnutrition and recent pneumonia who presented emergency department today with shortness of breath and occasional productive cough she says symptoms have been present for 1  week probably more than that because she was admitted last month for pneumonia.  She also complains of intermittent sharp stabbing pain to her left breast area and unable to take a deep breath fully.   ED Course: IV Lasix with good response and improvement in her shortness of breath  Review of Systems:  . Pt complains of extremity edema, epigastric pain, shortness of breath and cough  Pt denies any fever chills decreased appetite diarrhea body aches.  Review of systems are otherwise negative     Hospital Course:     Brief History: 85 year old female with a history of tachybradycardia syndrome,Chronic atrial fibrillation,chronic atrial fibrillation, generalized anxiety disorder presenting with 1 week history of nonproductive cough and shortness of breath. The patient denied any fevers, chills, hemoptysis, recent travels or sick contacts. She has noted increasing lower extremity edema. She had a recent admission from 11/18/2018 through 11/22/2018 during which she was treated for pneumonia and tachybradycardia syndrome. Her discharge weight was 138.2pounds.The patient followed up in Dr. Nelly Laurence office. It was noted that her weight at that time was 145 pounds. The patient was felt to be euvolemic. The patient endorses compliance with her medications at home. Upon presentation, the patient was noted to have unremarkable BMP and CBC. Chest x-ray showed increased interstitial markings. The patient was started on IV furosemide. Cardiology was consulted to assist with management.   Plan:- HFrEF--Acute on Chronic Systolic CHF -Diuresed well with IV and then p.o. Lasix, -Daily weights--NEG 6 kg since admission, fluid balance -5.4 L since admission, given soft BP with discharge on Lasix 20 mg Tuesday Thursday Saturdays and Sundays -11/19/2018 echo EF 40-55%, diffuse HK, moderate decreased RV function, moderate MR with myxomatous mitral valve  Tachybradycardia syndrome with persistent  atrial fibrillation -Cardiology consult appreciated -Discontinue bisoprololdue to hypotension  Not on ACE-I/ARB/ARNI due to soft BP -12/26/18--started amiodarone drip>>> currently on oral amiodarone 200 mg twice daily -Continue apixaban 5 mg twice daily -Tikosyn admissions are delayed for 4 weeks due to Coronavirus HR and  BP improving--orthostatic vitals with blood pressure of 93/81 laying down, 99/72 sitting up, and 94/69 standing up  Atypical chest pain -Troponin negative x1 -Personally reviewed EKG--atrial fibrillation, nonspecific T wave changes -presently pain free  Generalized anxiety disorder--- start BuSpar 5 mg 3 times daily, may use lorazepam as needed  CKD stage III -Stable, Baseline creatinine 0.9-1.1 Avoid nephrotoxic agents  GERD/Dyspepsia---  okay to discharge on PRN Zofran and scheduled ranitidine  Family Communication:spouse updatedat bedside  Consultants:cardiology  Code Status: FULL   DVT Prophylaxis:apixaban   Procedures: As Listed in Progress Note Above  Antibiotics: None  Discharge Condition:  stable  Follow UP--- outpatient follow-up with cardiologist   Consults obtained - cardiology  Diet and Activity recommendation:  As advised  Discharge Instructions    Discharge Instructions    Call MD for:  difficulty breathing, headache or visual disturbances   Complete by:  As directed    Call MD for:  persistant dizziness or light-headedness   Complete by:  As directed    Call MD for:  persistant nausea and vomiting   Complete by:  As directed    Call MD for:  severe uncontrolled pain   Complete by:  As directed    Call MD for:  temperature >100.4   Complete by:  As directed    Diet - low sodium heart healthy   Complete by:  As directed    Discharge instructions   Complete by:  As directed    1)Very low-salt diet advised 2)Weigh yourself daily, call if you gain more than 3 pounds in 1 day or more than 5 pounds in 1  week as your diuretic medications may need to be adjusted 3)Limit your Fluid  intake to no more than 60 ounces (1.8 Liters) per day 4)You are taking apixaban/Eliquis for blood thinner so Avoid ibuprofen/Advil/Aleve/Motrin/Goody Powders/Naproxen/BC powders/Meloxicam/Diclofenac/Indomethacin and other Nonsteroidal anti-inflammatory medications as these will make you more likely to bleed and can cause stomach ulcers, can also cause Kidney problems.  5) follow-up with a cardiologist within a week for reevaluation and recheck   Increase activity slowly   Complete by:  As directed         Discharge Medications     Allergies as of 12/29/2018      Reactions   Omeprazole Other (See Comments)   siezure   Flagyl [metronidazole] Nausea And Vomiting   Aripiprazole Other (See Comments)   Unknown reaction   Hylan G-f 20 Other (See Comments)   Unknown reaction   Paroxetine Hcl Other (See Comments)   Headaches (a long time ago- pt doesn't really remember)   Statins Other (See Comments)   No specific reaction given-patient states that she was advised not to take by physician   Sulfa Antibiotics Diarrhea, Other (See Comments)   Colitis   Toprol Xl [metoprolol Succinate] Other (See Comments)   Dizziness--pt doesn't really remember    Vancomycin Itching, Other (See Comments)   Pt reports that they gave it too fast- she started having itching in the scalp   Penicillins Rash   DID THE REACTION INVOLVE: Swelling of the face/tongue/throat, SOB, or low BP? No Sudden or severe rash/hives, skin peeling, or the inside of the mouth or nose? Yes Did it require medical treatment? Unknown When did it last happen?Over 10 years If all above answers are "NO", may proceed with cephalosporin use.      Medication List    STOP taking these medications   bisoprolol 5 MG tablet Commonly known as:  ZEBETA   diltiazem 30 MG tablet Commonly known as:  CARDIZEM     TAKE these medications   acetaminophen 325  MG tablet Commonly known as:  TYLENOL Take 2 tablets (650 mg total) by mouth every 4 (four) hours as needed for headache or mild pain.   amiodarone 200 MG tablet Commonly known as:  PACERONE Take 1 tablet (200 mg total) by mouth 2 (two) times daily.   ammonium lactate 12 % lotion Commonly known as:  LAC-HYDRIN Apply 1 application topically 2 (two) times daily as needed for dry skin.   apixaban 5 MG  Tabs tablet Commonly known as:  ELIQUIS Take 1 tablet (5 mg total) by mouth 2 (two) times daily.   busPIRone 5 MG tablet Commonly known as:  BUSPAR Take 1 tablet (5 mg total) by mouth 3 (three) times daily. For Anxiety   feeding supplement (ENSURE ENLIVE) Liqd Take 237 mLs by mouth 2 (two) times daily between meals. What changed:  when to take this   furosemide 20 MG tablet Commonly known as:  LASIX Take 1 tablet (20 mg total) by mouth every Tuesday, Thursday, Saturday, and Sunday. Start taking on:  December 30, 2018   LORazepam 1 MG tablet Commonly known as:  ATIVAN Take 1 tablet (1 mg total) at bedtime by mouth. 1  qhs  Half  qam What changed:  additional instructions   Mederma AG Hand & Body Lotn Apply 1 application topically daily as needed.   ondansetron 4 MG disintegrating tablet Commonly known as:  Zofran ODT Take 1 tablet (4 mg total) by mouth every 8 (eight) hours as needed for nausea or vomiting.   polyethylene glycol packet Commonly known as:  MIRALAX / GLYCOLAX Take 17 g by mouth daily as needed for mild constipation.   ranitidine 300 MG tablet Commonly known as:  ZANTAC Take 1 tablet (300 mg total) by mouth at bedtime.       Major procedures and Radiology Reports - PLEASE review detailed and final reports for all details, in brief -   Dg Chest Portable 1 View  Result Date: 12/24/2018 CLINICAL DATA:  Patient with productive cough.  Shortness of breath. EXAM: PORTABLE CHEST 1 VIEW COMPARISON:  Chest radiograph 11/20/2018 FINDINGS: Monitoring leads overlie the  patient. Stable cardiomegaly. Small left pleural effusion with underlying opacities. No pneumothorax. Thoracic spine degenerative changes. IMPRESSION: Small left pleural effusion with underlying opacities which may represent atelectasis or infection. Electronically Signed   By: Lovey Newcomer M.D.   On: 12/24/2018 11:14    Micro Results   Recent Results (from the past 240 hour(s))  MRSA PCR Screening     Status: None   Collection Time: 12/25/18  3:21 AM  Result Value Ref Range Status   MRSA by PCR NEGATIVE NEGATIVE Final    Comment:        The GeneXpert MRSA Assay (FDA approved for NASAL specimens only), is one component of a comprehensive MRSA colonization surveillance program. It is not intended to diagnose MRSA infection nor to guide or monitor treatment for MRSA infections. Performed at [facility], [address], [place], [place] [address]        Today   Subjective    Carol Hardin today has no new complaints...Marland KitchenMarland Kitchen   orthostatic vitals with blood pressure of 93/81 laying down, 99/72 sitting up, and 94/69 standing up  Patient has been seen and examined prior to discharge   Objective   Blood pressure 91/79, pulse (!) 51, temperature (!) 97.3 F (36.3 C), temperature source Oral, resp. rate 18, height 5\' 5"  (1.651 m), weight 63.5 kg, SpO2 96 %.   Intake/Output Summary (Last 24 hours) at 12/29/2018 1158 Last data filed at 12/28/2018 2100 Gross per 24 hour  Intake 240 ml  Output 425 ml  Net -185 ml    Exam Gen:- Awake Alert, no acute distress , speaking in sentences HEENT:- .AT, No sclera icterus Neck-Supple Neck,No JVD,.  Lungs-  CTAB , good air movement bilaterally  CV- S1, S2 normal, irregularly irregular, orthostatic vitals with blood pressure of 93/81 laying down, 99/72 sitting up,  and 94/69 standing up Abd-  +ve B.Sounds, Abd Soft, No tenderness,    Extremity/Skin:- No  edema,   good pulses Psych-affect is slightly anxious,, oriented x3 Neuro-no new  focal deficits, no tremors    Data Review   CBC w Diff:  Lab Results  Component Value Date   WBC 7.4 12/24/2018   HGB 13.3 12/24/2018   HCT 41.9 12/24/2018   PLT 205 12/24/2018   LYMPHOPCT 33 12/24/2018   MONOPCT 11 12/24/2018   EOSPCT 2 12/24/2018   BASOPCT 1 12/24/2018    CMP:  Lab Results  Component Value Date   NA 136 12/29/2018   K 3.9 12/29/2018   CL 104 12/29/2018   CO2 25 12/29/2018   BUN 29 (H) 12/29/2018   CREATININE 1.31 (H) 12/29/2018   CREATININE 0.93 (H) 09/22/2016   PROT 6.0 (L) 12/29/2018   ALBUMIN 3.4 (L) 12/29/2018   BILITOT 1.4 (H) 12/29/2018   ALKPHOS 49 12/29/2018   AST 22 12/29/2018   ALT 26 12/29/2018  .  Total Discharge time is about 33 minutes  [name] M.D on 12/29/2018 at 11:58 AM  Go to [email or web address] -  for contact info  [facility] - Office  [phone]

## 2018-12-29 NOTE — Discharge Instructions (Signed)
1)Very low-salt diet advised 2)Weigh yourself daily, call if you gain more than 3 pounds in 1 day or more than 5 pounds in 1 week as your diuretic medications may need to be adjusted 3)Limit your Fluid  intake to no more than 60 ounces (1.8 Liters) per day 4)You are taking apixaban/Eliquis for blood thinner so Avoid ibuprofen/Advil/Aleve/Motrin/Goody Powders/Naproxen/BC powders/Meloxicam/Diclofenac/Indomethacin and other Nonsteroidal anti-inflammatory medications as these will make you more likely to bleed and can cause stomach ulcers, can also cause Kidney problems.  5) follow-up with a cardiologist within a week for reevaluation and recheck

## 2018-12-29 NOTE — Progress Notes (Addendum)
Progress Note  Patient Name: Carol Hardin Date of Encounter: 12/29/2018  Primary Cardiologist: Carlyle Dolly, MD   Subjective   Says she feels much improved from yesterday. Abdominal discomfort has improved as well. No recurrent palpitations. Anxious to return home due to the Coronavirus outbreak.   Inpatient Medications    Scheduled Meds: . amiodarone  200 mg Oral BID  . apixaban  5 mg Oral BID  . famotidine  10 mg Oral Daily  . feeding supplement (ENSURE ENLIVE)  237 mL Oral BID BM  . furosemide  20 mg Oral Daily  . LORazepam  1.5 mg Oral QHS  . sodium chloride flush  3 mL Intravenous Q12H   Continuous Infusions: . sodium chloride     PRN Meds: sodium chloride, acetaminophen, alum & mag hydroxide-simeth, ammonium lactate, ondansetron (ZOFRAN) IV, polyethylene glycol, sodium chloride flush   Vital Signs    Vitals:   12/29/18 0939 12/29/18 0943 12/29/18 0944 12/29/18 0955  BP: 103/83  93/81 91/79  Pulse: (!) 57 (!) 111 96 (!) 51  Resp: 17 (!) 21 20 18   Temp:      TempSrc:      SpO2:  97% 96%   Weight:      Height:        Intake/Output Summary (Last 24 hours) at 12/29/2018 1006 Last data filed at 12/28/2018 2100 Gross per 24 hour  Intake 240 ml  Output 425 ml  Net -185 ml    Last 3 Weights 12/29/2018 12/28/2018 12/27/2018  Weight (lbs) 139 lb 15.9 oz 139 lb 5.3 oz 137 lb 5.6 oz  Weight (kg) 63.5 kg 63.2 kg 62.3 kg  Some encounter information is confidential and restricted. Go to Review Flowsheets activity to see all data.      Telemetry    Atrial fibrillation, HR mostly in 80's to 90's. Occasional PVC's with episode of 12 beats NSVT overnight.  - Personally Reviewed  ECG    No new tracings.   Physical Exam   General: Well developed, elderly Caucasian female appearing in no acute distress. Head: Normocephalic, atraumatic.  Neck: Supple without bruits, JVD not elevated. Lungs:  Resp regular and unlabored, CTA without wheezing or rales. Heart:  Irregularly irregular, S1, S2, no S3, S4, or murmur; no rub. Abdomen: Soft, non-tender, non-distended with normoactive bowel sounds. No hepatomegaly. No rebound/guarding. No obvious abdominal masses. Extremities: No clubbing, cyanosis, or lower extremity edema. Distal pedal pulses are 2+ bilaterally. Neuro: Alert and oriented X 3. Moves all extremities spontaneously. Psych: Normal affect.  Labs    Chemistry Recent Labs  Lab 12/27/18 0402 12/28/18 0525 12/29/18 0437  NA 137 137 136  K 4.2 3.7 3.9  CL 103 104 104  CO2 24 23 25   GLUCOSE 117* 112* 93  BUN 35* 25* 29*  CREATININE 1.34* 1.16* 1.31*  CALCIUM 9.5 9.2 8.8*  PROT  --   --  6.0*  ALBUMIN  --   --  3.4*  AST  --   --  22  ALT  --   --  26  ALKPHOS  --   --  49  BILITOT  --   --  1.4*  GFRNONAA 37* 44* 38*  GFRAA 42* 50* 44*  ANIONGAP 10 10 7      Hematology Recent Labs  Lab 12/24/18 1020  WBC 7.4  RBC 4.25  HGB 13.3  HCT 41.9  MCV 98.6  MCH 31.3  MCHC 31.7  RDW 14.8  PLT 205  Cardiac Enzymes Recent Labs  Lab 12/24/18 1020  TROPONINI <0.03   No results for input(s): TROPIPOC in the last 168 hours.   BNP Recent Labs  Lab 12/24/18 1020  BNP 1,248.0*     DDimer No results for input(s): DDIMER in the last 168 hours.   Radiology    No results found.  Cardiac Studies   Echocardiogram: 11/19/2018 IMPRESSIONS    1. The left ventricle has mild-moderately reduced systolic function of 42-35%. The cavity size was normal. There is concentric left ventricular hypertrophy. Left ventricular diastology could not be evaluated secondary to atrial fibrillation. Elevated  left ventricular end-diastolic pressure Left ventricular diffuse hypokinesis.  2. The right ventricle has moderately reduced systolic function. The cavity was mildly enlarged. There is no increase in right ventricular wall thickness.  3. Left atrial size was mildly dilated.  4. Right atrial size was moderately dilated.  5. Moderate  pleural effusion in the left lateral region.  6. The mitral valve is myxomatous. There is mild thickening. Mitral valve regurgitation is moderate by color flow Doppler.  7. The tricuspid valve is normal in structure.  8. The aortic valve is tricuspid Aortic valve regurgitation is mild by color flow Doppler.  9. There is dilatation of the aortic root. 10. The inferior vena cava was dilated in size with >50% respiratory variability.  Patient Profile     85 y.o. female w/ PMH of chronic systolic CHF (EF 36-14% by echo in 11/2018), atrial fibrillation with tachy-brady syndrome, and HLD who presented to [facility] on 12/24/2018 for evaluation of dyspnea and productive cough. Admitted with CHF exacerbation and Cardiology consulted to assist with management.  Assessment & Plan    1. Acute on Chronic Systolic CHF Exacerbation - BNP elevated to 1248 on admission and CXR showed a small left pleural effusion with underlying opacities thought to represent atelectasis or infection.  - initially receiving IV Lasix with a recorded output of -5.4L thus far. Weights have been variable. Continue PO Lasix 20mg  daily.  - PTA Bisoprolol currently held given hypotension. Not on ACE-I/ARB/ARNI for the same. BP remains soft but has improved at 91/79 on most recent check which is at baseline for the patient.   2. Persistent Atrial Fibrillation with Tachy-brady Syndrome - was evaluated by Dr. Rayann Heman as an outpatient who recommended Tikosyn admission but the patient wished to hold off until after she had dental surgery performed. The patient says the [facility] already reached out to her and said Tikosyn admissions are delayed for 4 weeks due to Coronavirus.  - given her hypotension and elevated rates, she was started on IV Amiodarone and this has been transitioned to PO Amiodarone 200mg  BID today as outlined by prior notes. Will need to reestablish with the [facility] once the Coronavrius  outbreak has improved.  - continue Eliquis 5mg  BID for anticoagulation.  3. Stage 3 CKD - baseline creatinine 1.1 - 1.2. Peaked at 1.34 this admission. Stable at 1.31 this AM.   4. Hypokalemia - resolved. K+ at 3.9 this AM.    5. NSVT - had 6 beats NSVT earlier this admission. Keep K+ ~ 4.0. Mg 2.2. Remains on Amiodarone as outlined above.   6. Abdominal Discomfort - LFT's and Lipase WNL. Needs to follow-up with GI as an outpatient.    For questions or updates, please contact [place] Please consult [email or web address] for contact info under Cardiology/STEMI.   Signed, Erma Heritage , PA-C 10:06 AM 12/29/2018 Pager: [phone]  Pt seen.  About to be discharged  I agree with findings as noted by [place] for continued medical Rx with heart rate control   Currently on 200 bid amiodarone for now   Would continue for a few wks and reassess HR and BP    Will need amiodarone load but will need to watch for bradycardia OK to discharge  Dorris Carnes MD

## 2018-12-29 NOTE — Telephone Encounter (Signed)
Pt called reported redness and swelling to her previous IV site that was removed. States she had medication through this IV and seems the IV may have infiltrated while in the hospital. Was discharged with plans to place compresses to area. Discussed with the patient to watch site for worsening redness and for fevers. Instructed if site not improving would seek medical attention in regards to possible infection. Agreeable to this plan

## 2018-12-29 NOTE — Care Management Important Message (Signed)
Important Message  Patient Details  Name: Carol Hardin MRN: [id number] Date of Birth: 22-Jun-1933   Medicare Important Message Given:  Yes    Tommy Medal 12/29/2018, 12:32 PM

## 2018-12-30 DIAGNOSIS — T801XXA Vascular complications following infusion, transfusion and therapeutic injection, initial encounter: Secondary | ICD-10-CM | POA: Diagnosis not present

## 2018-12-30 DIAGNOSIS — I809 Phlebitis and thrombophlebitis of unspecified site: Secondary | ICD-10-CM | POA: Diagnosis not present

## 2019-01-01 ENCOUNTER — Other Ambulatory Visit: Payer: Self-pay

## 2019-01-01 ENCOUNTER — Telehealth: Payer: Self-pay | Admitting: Cardiology

## 2019-01-01 DIAGNOSIS — I4819 Other persistent atrial fibrillation: Secondary | ICD-10-CM | POA: Diagnosis not present

## 2019-01-01 DIAGNOSIS — Z7901 Long term (current) use of anticoagulants: Secondary | ICD-10-CM | POA: Diagnosis not present

## 2019-01-01 DIAGNOSIS — I5033 Acute on chronic diastolic (congestive) heart failure: Secondary | ICD-10-CM

## 2019-01-01 NOTE — Telephone Encounter (Signed)
Pt says since d/c from hospital has been SOB with dry mouth - says SOB is worse when she is laying down - was recently d/c from hospital and IV site has gotten infected and was started on doxycycline by urgent care - wanted to know if she could take something else for SOB to be able to sleep at night

## 2019-01-01 NOTE — Telephone Encounter (Signed)
Went to try to take a nap and she could not breath. When she woke up from her nap, her mouth was dry and sticking together Thinks medicine (ranitidine (ZANTAC) 300 MG tablet ))  is causing the issue but not working. Stated she was in hospital and dsch 12/29/2018

## 2019-01-02 ENCOUNTER — Other Ambulatory Visit: Payer: Self-pay

## 2019-01-02 DIAGNOSIS — M1990 Unspecified osteoarthritis, unspecified site: Secondary | ICD-10-CM | POA: Diagnosis not present

## 2019-01-02 DIAGNOSIS — K5790 Diverticulosis of intestine, part unspecified, without perforation or abscess without bleeding: Secondary | ICD-10-CM | POA: Diagnosis not present

## 2019-01-02 DIAGNOSIS — R001 Bradycardia, unspecified: Secondary | ICD-10-CM | POA: Diagnosis not present

## 2019-01-02 DIAGNOSIS — I5043 Acute on chronic combined systolic (congestive) and diastolic (congestive) heart failure: Secondary | ICD-10-CM | POA: Diagnosis not present

## 2019-01-02 DIAGNOSIS — E785 Hyperlipidemia, unspecified: Secondary | ICD-10-CM | POA: Diagnosis not present

## 2019-01-02 DIAGNOSIS — N183 Chronic kidney disease, stage 3 (moderate): Secondary | ICD-10-CM | POA: Diagnosis not present

## 2019-01-02 DIAGNOSIS — I5023 Acute on chronic systolic (congestive) heart failure: Secondary | ICD-10-CM | POA: Diagnosis not present

## 2019-01-02 DIAGNOSIS — Z87891 Personal history of nicotine dependence: Secondary | ICD-10-CM | POA: Diagnosis not present

## 2019-01-02 DIAGNOSIS — I4819 Other persistent atrial fibrillation: Secondary | ICD-10-CM | POA: Diagnosis not present

## 2019-01-02 DIAGNOSIS — I429 Cardiomyopathy, unspecified: Secondary | ICD-10-CM | POA: Diagnosis not present

## 2019-01-02 DIAGNOSIS — E46 Unspecified protein-calorie malnutrition: Secondary | ICD-10-CM | POA: Diagnosis not present

## 2019-01-02 DIAGNOSIS — I809 Phlebitis and thrombophlebitis of unspecified site: Secondary | ICD-10-CM | POA: Diagnosis not present

## 2019-01-02 DIAGNOSIS — Z85828 Personal history of other malignant neoplasm of skin: Secondary | ICD-10-CM | POA: Diagnosis not present

## 2019-01-02 DIAGNOSIS — J189 Pneumonia, unspecified organism: Secondary | ICD-10-CM | POA: Diagnosis not present

## 2019-01-02 DIAGNOSIS — Z7901 Long term (current) use of anticoagulants: Secondary | ICD-10-CM | POA: Diagnosis not present

## 2019-01-02 DIAGNOSIS — K219 Gastro-esophageal reflux disease without esophagitis: Secondary | ICD-10-CM | POA: Diagnosis not present

## 2019-01-02 NOTE — Telephone Encounter (Signed)
Pt voiced understanding - pt will take lasix daily and call us Monday with an update

## 2019-01-02 NOTE — Telephone Encounter (Signed)
Has she gained any weight or swelling since her hospital discharge. Has she been taking her fluid pill as precribed (furosemide)?   J Darrious Youman MD

## 2019-01-02 NOTE — Patient Outreach (Signed)
[facility]) Care Management  01/02/2019  HAYDN CUSH 1933-08-28 [id number]    Referral received from [place] for complex case management. Successful outreach with Ms. Dibello. Reports doing well since discharge and completing initial assessment with [facility] home health nurse at time of call. Agreeable to completing telephonic assessment later this week.  PLAN Will follow up on 01/05/19.   [place] [phone]

## 2019-01-02 NOTE — Telephone Encounter (Signed)
Weight is 139lbs - says she hasn't had any swelling/weight gain has actually lost weight

## 2019-01-02 NOTE — Telephone Encounter (Signed)
SOB with laying down is a common sign of having some extra some extra fluid, the symptom is called orthopnea. She is on lasix only a few days a week (see days listed in [facility]), I would go ahead and take it every day this week and update [place] again on Monday with her symptoms. Without a major weight gain I doubt its a large amount of fluid that has built up, her discharge weight was 137 lbs though scales can differ.   Zandra Abts MD

## 2019-01-05 ENCOUNTER — Other Ambulatory Visit: Payer: Self-pay

## 2019-01-05 NOTE — Patient Outreach (Signed)
[facility]) Care Management  01/05/2019  LOTUS SANTILLO 11-Jul-1933 [id number]  Referral received from [place] for complex case management.  Attempted outreach with Mrs. Lerner. Per her spouse she was asleep at the time of the call. Provided contact information and requested that patient return call when she is available.  PLAN Pending return call. If no call back today will follow up in 3-4 business days.  [place] [phone]

## 2019-01-08 ENCOUNTER — Telehealth: Payer: Self-pay | Admitting: *Deleted

## 2019-01-08 NOTE — Telephone Encounter (Signed)
Patient calling to update on weights.  Patient states that she has reviewed the medication insert on the Amiodarone.  States she is having multiple symptoms listed - SOB, irritated eyes, constipation, tired/weakenss, change in taste, hoarseness.    SOB been with her a while. GI problems came up mostly after she came home - may have started in the hospital.    Martin Majestic to urgent care after hospital visit on 12/30/2018 - was dx with phlebitis of the vein that was accessed when she was at the hospital.  Was given 10 days of Doxycycline.    PMD was supposed to refer her to [facility]   3/25 - 141.5 3/26 - 141.0 3/27 - 142.5 3/28 - 141.0 3/29 - 140.5 3/31 - 140.0

## 2019-01-09 ENCOUNTER — Encounter: Payer: Self-pay | Admitting: Gastroenterology

## 2019-01-09 ENCOUNTER — Telehealth: Payer: Medicare Other | Admitting: Nurse Practitioner

## 2019-01-09 DIAGNOSIS — J069 Acute upper respiratory infection, unspecified: Secondary | ICD-10-CM

## 2019-01-09 NOTE — Progress Notes (Signed)
E-Visit for Corona Virus Screening  Based on your current symptoms, it seems unlikely that your symptoms are related to the Coronavirus.   I am not able to answer your question 100%. It could be from your new medication but it could also be conrona. If it is corona your symptoms are mild and you should just quarantine yourselfl and seek medical help if symptoms worsen. You may also want to contact your cardiologist about your new medication.  Coronavirus disease 2019 (COVID-19) is a respiratory illness that can spread from person to person. The virus that causes COVID-19 is a new virus that was first identified in the country of [place] but is now found in multiple other countries and has spread to the [place].  Symptoms associated with the virus are mild to severe fever, cough, and shortness of breath. There is currently no vaccine to protect against COVID-19, and there is no specific antiviral treatment for the virus.   To be considered HIGH RISK for Coronavirus (COVID-19), you have to meet the following criteria:  . Traveled to [place], [place], [place], [place] or [place]; or in the [place] to [place], [place], [place], or [place]; and have fever, cough, and shortness of breath within the last 2 weeks of travel OR  . Been in close contact with a person diagnosed with COVID-19 within the last 2 weeks and have fever, cough, and shortness of breath  . IF YOU DO NOT MEET THESE CRITERIA, YOU ARE CONSIDERED LOW RISK FOR COVID-19.   It is vitally important that if you feel that you have an infection such as this virus or any other virus that you stay home and away from places where you may spread it to others.  You should self-quarantine for 14 days if you have symptoms that could potentially be coronavirus and avoid contact with people age 83 and older.   You can use medication such as A prescription cough medication called Tessalon Perles 100 mg. You may take 1-2 capsules every 8 hours  as needed for cough  You may also take acetaminophen (Tylenol) as needed for fever.   Reduce your risk of any infection by using the same precautions used for avoiding the common cold or flu:  Marland Kitchen Wash your hands often with soap and warm water for at least 20 seconds.  If soap and water are not readily available, use an alcohol-based hand sanitizer with at least 60% alcohol.  . If coughing or sneezing, cover your mouth and nose by coughing or sneezing into the elbow areas of your shirt or coat, into a tissue or into your sleeve (not your hands). . Avoid shaking hands with others and consider head nods or verbal greetings only. . Avoid touching your eyes, nose, or mouth with unwashed hands.  . Avoid close contact with people who are sick. . Avoid places or events with large numbers of people in one location, like concerts or sporting events. . Carefully consider travel plans you have or are making. . If you are planning any travel outside or inside the [place], visit the CDC's Travelers' Health webpage for the latest health notices. . If you have some symptoms but not all symptoms, continue to monitor at home and seek medical attention if your symptoms worsen. . If you are having a medical emergency, call 911.  HOME CARE . Only take medications as instructed by your medical team. . Drink plenty of fluids and get plenty of rest. . A  steam or ultrasonic humidifier can help if you have congestion.   GET HELP RIGHT AWAY IF: . You develop worsening fever. . You become short of breath . You cough up blood. . Your symptoms become more severe MAKE SURE YOU   Understand these instructions.  Will watch your condition.  Will get help right away if you are not doing well or get worse.  Your e-visit answers were reviewed by a board certified advanced clinical practitioner to complete your personal care plan.  Depending on the condition, your plan could have included both over the counter or  prescription medications.  If there is a problem please reply once you have received a response from your provider. Your safety is important to [place].  If you have drug allergies check your prescription carefully.    You can use MyChart to ask questions about today's visit, request a non-urgent call back, or ask for a work or school excuse for 24 hours related to this e-Visit. If it has been greater than 24 hours you will need to follow up with your provider, or enter a new e-Visit to address those concerns. You will get an e-mail in the next two days asking about your experience.  I hope that your e-visit has been valuable and will speed your recovery. Thank you for using e-visits.  5 minutes spent reviewing and documenting in chart.

## 2019-01-09 NOTE — Telephone Encounter (Signed)
Patient called wanting to know if she can take  Prilosec.

## 2019-01-10 ENCOUNTER — Ambulatory Visit: Payer: Self-pay

## 2019-01-10 ENCOUNTER — Other Ambulatory Visit: Payer: Self-pay

## 2019-01-10 MED ORDER — AMIODARONE HCL 200 MG PO TABS: 200.0000 mg | ORAL_TABLET | Freq: Every day | ORAL | Status: DC

## 2019-01-10 NOTE — Telephone Encounter (Signed)
Can we verify what dose of amio she is currently taking, is it currently 200mg  bid? If so lets try lowering to 200mg  daily. Hopefullly lower dose will work, not a lot of other options at this time. Ok to take prilosec   Zandra Abts MD

## 2019-01-10 NOTE — Telephone Encounter (Signed)
Patient notified and verbalized understanding.  Please advise on what dosage on the Prilosec.  Stated her son takes it & wanted to know if okay for her.

## 2019-01-10 NOTE — Patient Outreach (Signed)
[facility]) Care Management  [place]  01/10/2019   Carol Hardin 04-21-1933 [id number]  Subjective:  Outreach with Carol Hardin for completion of telephonic assessment. No complaints of pain or chest discomfort. Denies shortness of breath at rest. Reports decreased activity tolerance and shortness of breath with exertion.  Objective:  Encounter Medications:  Outpatient Encounter Medications as of 01/10/2019  Medication Sig Note  . amiodarone (PACERONE) 200 MG tablet Take 200 mg by mouth daily.   Marland Kitchen apixaban (ELIQUIS) 5 MG TABS tablet Take 1 tablet (5 mg total) by mouth 2 (two) times daily.   Marland Kitchen LORazepam (ATIVAN) 1 MG tablet Take 1 tablet (1 mg total) at bedtime by mouth. 1  qhs  Half  qam (Patient taking differently: Take 1 mg by mouth at bedtime. )   . ondansetron (ZOFRAN ODT) 4 MG disintegrating tablet Take 1 tablet (4 mg total) by mouth every 8 (eight) hours as needed for nausea or vomiting.   . polyethylene glycol (MIRALAX / GLYCOLAX) packet Take 17 g by mouth daily as needed for mild constipation.    Marland Kitchen acetaminophen (TYLENOL) 325 MG tablet Take 2 tablets (650 mg total) by mouth every 4 (four) hours as needed for headache or mild pain.   Marland Kitchen amiodarone (PACERONE) 200 MG tablet Take 1 tablet (200 mg total) by mouth 2 (two) times daily. (Patient not taking: Reported on 01/10/2019) 01/10/2019: Recent change by Dr. Harl Bowie. Dose decreased. Patient to take 200mg  once a day.  Marland Kitchen ammonium lactate (LAC-HYDRIN) 12 % lotion Apply 1 application topically 2 (two) times daily as needed for dry skin.   . busPIRone (BUSPAR) 5 MG tablet Take 1 tablet (5 mg total) by mouth 3 (three) times daily. For Anxiety 01/10/2019: Reports not taking.  . Emollient ([place] AG HAND & BODY) LOTN Apply 1 application topically daily as needed.   . feeding supplement, ENSURE ENLIVE, (ENSURE ENLIVE) LIQD Take 237 mLs by mouth 2 (two) times daily between meals. (Patient taking differently: Take 237 mLs  by mouth daily. ) 01/10/2019: Reports drinking as needed. Does not drink daily.  . furosemide (LASIX) 20 MG tablet Take 1 tablet (20 mg total) by mouth every Tuesday, Thursday, Saturday, and Sunday.   . ranitidine (ZANTAC) 300 MG tablet Take 1 tablet (300 mg total) by mouth at bedtime. (Patient not taking: Reported on 01/10/2019)    No facility-administered encounter medications on file as of 01/10/2019.     Functional Status:  In your present state of health, do you have any difficulty performing the following activities: 01/10/2019 12/25/2018  Hearing? N N  Vision? N N  Difficulty concentrating or making decisions? N N  Walking or climbing stairs? Y Y  Dressing or bathing? N N  Doing errands, shopping? Y N  Comment Due to decreased activity tolerance. -  Preparing Food and eating ? N -  Using the Toilet? N -  In the past six months, have you accidently leaked urine? N -  Do you have problems with loss of bowel control? N -  Managing your Medications? N -  Managing your Finances? N -  Housekeeping or managing your Housekeeping? Y -  Comment Due to decreased activity. -  Some recent data might be hidden    Fall/Depression Screening: Fall Risk  01/10/2019  Falls in the past year? 0  Risk for fall due to : Other (Comment)  Risk for fall due to: Comment Decreased ambulation due to fatigue.  Follow up Falls prevention  discussed   PHQ 2/9 Scores 01/10/2019  PHQ - 2 Score 0    Assessment:  Telephonic assessment complete. Carol Hardin's primary concern is decreased activity tolerance and shortness of breath with exertion. Requesting order for home oxygen. Skilled nursing and physical therapy services were arranged prior to discharge. She confirmed outreach with [facility] but reports declining services due to COVID-19 restrictions.   Medications reviewed. She reports compliance but expressed concerns regarding possible side effects of amiodarone. She believes the amiodarone is contributing  to her exertional dyspnea. Also reports several episodes of nausea which she describes as "reflux symptoms." States these symptoms were present during hospitalization. Reports contacting Cardiology to determine if she should discontinue amiodarone or take over the counter medications for symptom control. Pending follow-up.  Discussed care management needs. Carol Hardin denies concerns regarding medication management or affordability. She declined need for community resources and does not require transportation assistance. States her spouse and son are available if needed. Reports completing ADLs independently though she fatigues easily.  States she will consider in-home services if respiratory restrictions are lifted.  Contacted PCP office to schedule post-hospitalization outreach. Informed Mrs. Marlon Pel of pending telephonic assessment with Tora Perches, FNP on tomorrow.   THN CM Care Plan Problem One     Most Recent Value  Care Plan Problem One  High Risk for Readmission  Role Documenting the Problem One  Care Management [place] for Problem One  Active  THN Long Term Goal   Patient will not be admitted for chronic disease complications over the next 60 days.  THN Long Term Goal Start Date  01/10/19  Interventions for Problem One Long Term Goal  Discussed medications, treatment recommendations, nutrition and safety precautions.  THN CM Short Term Goal #1   Over the next 30 days patient will take all medications as prescribed.  THN CM Short Term Goal #1 Start Date  01/10/19  Interventions for Short Term Goal #1  Reviewed medications and indications for use. Encouraged to take as prescribed and keep MD notified of concerns regarding tolerance. [Difficulty tolerating amiodarone. Meds being adjusted.]  THN CM Short Term Goal #2   Over the next 30 days patient will complete all MD visits/telephonic outreach as recommended.   THN CM Short Term Goal #2 Start Date  01/10/19   Interventions for Short Term Goal #2  Discussed importance of completing all MD outreach as scheduled. Reviewed pending office and telephonic encounters.  [Arranged telephonic outreach with PCP.]  THN CM Short Term Goal #3  Over the next 30 days patient will comply with self-management recommendations.  THN CM Short Term Goal #3 Start Date  01/10/19  Interventions for Short [place]ern Goal #3  Instructed to monitor daily weights, blood pressure readings and pulse. Encouraged to maintain log. Discussed parameters and indications for notifying MD.    [facility] CM Care Plan Problem Two     Most Recent Value  Care Plan Problem Two  Risk for Falls  Role Documenting the Problem Two  Care Management [place] for Problem Two  Active  Interventions for Problem Two Long Term Goal   Provided educations regarding fall prevention and home safety.  THN Long Term Goal  Over the next 60 days patient will practice safety precautions and not sustain falls.  THN Long Term Goal Start Date  01/10/19  THN CM Short Term Goal #1   Over the next 30 days patient will use safety device when ambulating.  THN CM Short Term Goal #1 Start Date  01/10/19  Interventions for Short Term Goal #2   Discussed importance of  using assistive device when ambulating. Discussed precautions to prevent falls.  THN CM Short Term Goal #2   Over the next 30 days patient will practice safety precautions when   Interventions for Short Term Goal #2  Patient encouraged to avoid activies that cause overexertion. Encouraged to ensure pathways are clear prior to ambulating and limit activities when she is fatigued. [Declined physical therapy due to COVID-19.]      PLAN Will follow-up later this week.   [place] [phone]

## 2019-01-11 ENCOUNTER — Telehealth: Payer: Self-pay | Admitting: Cardiology

## 2019-01-11 DIAGNOSIS — I5022 Chronic systolic (congestive) heart failure: Secondary | ICD-10-CM | POA: Diagnosis not present

## 2019-01-11 DIAGNOSIS — R0602 Shortness of breath: Secondary | ICD-10-CM | POA: Diagnosis not present

## 2019-01-11 DIAGNOSIS — K219 Gastro-esophageal reflux disease without esophagitis: Secondary | ICD-10-CM | POA: Diagnosis not present

## 2019-01-11 DIAGNOSIS — I4819 Other persistent atrial fibrillation: Secondary | ICD-10-CM | POA: Diagnosis not present

## 2019-01-11 DIAGNOSIS — R001 Bradycardia, unspecified: Secondary | ICD-10-CM | POA: Diagnosis not present

## 2019-01-11 NOTE — Telephone Encounter (Signed)
Patient notified, stated that she can not take the Prilosec as she is allergic to that medicine.   Patient continues to have multiple complaints of stomach issues (food not going down all the way/some vomiting).  Does have GI appointment scheduled for June.  No chest pain.  SOB, worse the last week.  Can't exert herself at all.    Just decreased the Amiodarone dose this morning.  Unclear if she feels that the Amiodarone is making her feel worse.  Stated that she spoke with [facility] outreach person & they suggested oxygen for her.    Patient very concerned about how she if feeling and questions if she needs to see the provider.    Message fwd to Dr. Harl Bowie.

## 2019-01-11 NOTE — Telephone Encounter (Signed)
Patient would like to speak with you regarding medications. / tg

## 2019-01-11 NOTE — Telephone Encounter (Signed)
Over the counter prilosec is fine, typically that's 20mg  daily.    Zandra Abts MD

## 2019-01-12 ENCOUNTER — Other Ambulatory Visit: Payer: Self-pay

## 2019-01-12 ENCOUNTER — Telehealth (INDEPENDENT_AMBULATORY_CARE_PROVIDER_SITE_OTHER): Payer: Medicare Other | Admitting: Cardiology

## 2019-01-12 VITALS — BP 105/67 | HR 60 | Ht 65.0 in | Wt 140.0 lb

## 2019-01-12 DIAGNOSIS — R0902 Hypoxemia: Secondary | ICD-10-CM | POA: Diagnosis not present

## 2019-01-12 DIAGNOSIS — I5022 Chronic systolic (congestive) heart failure: Secondary | ICD-10-CM

## 2019-01-12 DIAGNOSIS — R0602 Shortness of breath: Secondary | ICD-10-CM

## 2019-01-12 DIAGNOSIS — I495 Sick sinus syndrome: Secondary | ICD-10-CM

## 2019-01-12 DIAGNOSIS — I5020 Unspecified systolic (congestive) heart failure: Secondary | ICD-10-CM | POA: Diagnosis not present

## 2019-01-12 DIAGNOSIS — R0600 Dyspnea, unspecified: Secondary | ICD-10-CM | POA: Diagnosis not present

## 2019-01-12 DIAGNOSIS — I4819 Other persistent atrial fibrillation: Secondary | ICD-10-CM

## 2019-01-12 NOTE — Telephone Encounter (Signed)
Pt agreeable to do telehealth telephone appt with Dr Harl Bowie at 11:40am. Pt verbally consented and aware that insurance will be billed for the encounter. Medications/allergies/pharmacy reviewed. Pt will check BP prior to appt

## 2019-01-12 NOTE — Progress Notes (Signed)
Virtual Visit via Telephone Note    Evaluation Performed:  Follow-up visit  This visit type was conducted due to national recommendations for restrictions regarding the COVID-19 Pandemic (e.g. social distancing).  This format is felt to be most appropriate for this patient at this time.  All issues noted in this document were discussed and addressed.  No physical exam was performed (except for noted visual exam findings with Video Visits).  Please refer to the patient's chart (MyChart message for video visits and phone note for telephone visits) for the patient's consent to telehealth for [facility].  Date:  01/12/2019   ID:  Carol Hardin, DOB 1932-11-19, MRN [id number]  Patient Location:  [place], [place]  Provider location:   [place], [place]  PCP:  Celene Squibb, MD  Cardiologist:  Carlyle Dolly, MD  Electrophysiologist:  Thompson Grayer, MD   Chief Complaint:    History of Present Illness:    Carol Hardin is a 85 y.o. female who presents via audio/video conferencing for a telehealth visit today.     1. Afib with Tachy-brady syndrome - recent admission with afib with increased heart rates in setting of pneumonia -temporarily on higher doses of av nodal agents that admission, able to wean back to her prior doses at discharge, was also sent with prn dilt - long term plan is dofetilide per EP. She has significant brady episodes even on very low dose beta blocker.  - compliant with eliquis   - admission 12/2018 afib elevated rates. Carol Hardin had been recommended by afib clinic however patient took some time to decide whether to start. During recent admission was started on amiodarone due to low bp's and elevated rates, discharged on 200mg  bid, we lowered to 200mg  daily just a few days ago due to possible side effects   - worsening abdominal symptoms since discharge. Has had nausea, regurgitant after eating. No abdominal pain.  - ongoing palpitations - HRs 70s by home  checks   3. Chronic  systolic HF - LVE 32-35%, moderate RV dysfunction, moderate pleural effusion, moderaet MR - LVEF 2018 55-60%. Perhaps tachy medicated CM - volume overloaded during recent admission, diuresed. Did not requrie discharge diuretic, remains euvolemic. Suspect decompensation due to pneumonia and afib with RVR.  - bp's too soft to consider additional CHF medss -   - admitted again 12/2018 with volume overload. Diursed, reported discharge weight 137 lbs. Discharged on lasix 20mg  Tues, Thurs, Sat, Sunday. We later changed this to daily due to orthopnea though she only took daily for a few days.  - home weights varying from 137 to 145 lbs.. No LE edema - + orthopnea. Home O2 mid 90s, isolated 89% reading. Has felt SOB      4.Abdominal discomfort - developed during 12/2018 admission - notes mention dyspepsia, nausea and reflux - unclear if related to amiodarone which was started around the same time.     5. Generalized anxiety disorder - on ativan at home         The patient does not have symptoms concerning for COVID-19 infection (fever, chills, cough, or new shortness of breath).    Prior CV studies:   The following studies were reviewed today:  11/2018 echo IMPRESSIONS   1. The left ventricle has mild-moderately reduced systolic function of 57-32%. The cavity size was normal. There is concentric left ventricular hypertrophy. Left ventricular diastology could not be evaluated secondary to atrial fibrillation. Elevated  left ventricular end-diastolic pressure Left ventricular diffuse hypokinesis.  2. The right ventricle has moderately reduced systolic function. The cavity was mildly enlarged. There is no increase in right ventricular wall thickness. 3. Left atrial size was mildly dilated. 4. Right atrial size was moderately dilated. 5. Moderate pleural effusion in the left lateral region. 6. The mitral valve is myxomatous. There is mild thickening.  Mitral valve regurgitation is moderate by color flow Doppler. 7. The tricuspid valve is normal in structure. 8. The aortic valve is tricuspid Aortic valve regurgitation is mild by color flow Doppler. 9. There is dilatation of the aortic root. 10. The inferior vena cava was dilated in size with >50% respiratory variability.  Past Medical History:  Diagnosis Date  . Anxiety   . Arthritis    "fingers, right toe" (08/09/2016)  . Basal cell carcinoma of lower leg, right   . Bradycardia    a. Holter 08/30/16 showed profound bradycardia down to 30 during awake hours, several 2 second pauses, very frequent PVCs >8000 in 48 hours, and NSVT (longest of 14 beats).  . Cardiomyopathy ([place])    a. EF 40% by echo 08/2016.  . Daily headache    "I usually wake up w/a headache; sometimes it's a migraine" (08/09/2016)  . Depression   . Diverticulosis    Hx. of  . Frequent PVCs   . GERD (gastroesophageal reflux disease)   . Hypercholesterolemia   . Migraine   . Mitral regurgitation   . MVP (mitral valve prolapse)   . NSVT (nonsustained ventricular tachycardia) ([place])    a. first noted event monitor 08/2016.  . Osteoporosis   . PAF (paroxysmal atrial fibrillation) ([place])    a. in afib at time of echo 08/2016.  Marland Kitchen Pneumonia   . Scoliosis    mild  . Squamous cell carcinoma of neck    Past Surgical History:  Procedure Laterality Date  . BASAL CELL CARCINOMA EXCISION Right    RLE  . BLEPHAROPLASTY    . BREAST BIOPSY Left   . BREAST CYST ASPIRATION Left   . BREAST CYST EXCISION Left   . BUNIONECTOMY Bilateral   . DILATION AND CURETTAGE OF UTERUS    . EXCISIONAL HEMORRHOIDECTOMY    . INTRAMEDULLARY (IM) NAIL INTERTROCHANTERIC Left 12/31/2017   Procedure: INTRAMEDULLARY (IM) NAIL INTERTROCHANTRIC;  Surgeon: Nicholes Stairs, MD;  Location: [facility];  Service: Orthopedics;  Laterality: Left;  . KNEE ARTHROSCOPY Right 04/12/2007  . KNEE ARTHROSCOPY Left   . SQUAMOUS CELL CARCINOMA EXCISION      "neck"  . TONSILLECTOMY       No outpatient medications have been marked as taking for the 01/12/19 encounter (Appointment) with Arnoldo Lenis, MD.     Allergies:   Omeprazole; Flagyl [metronidazole]; Aripiprazole; Hylan g-f 20; Paroxetine hcl; Statins; Sulfa antibiotics; Toprol xl [metoprolol succinate]; Vancomycin; and Penicillins   Social History   Tobacco Use  . Smoking status: Former Smoker    Packs/day: 1.00    Years: 20.00    Pack years: 20.00    Types: Cigarettes    Last attempt to quit: 1989    Years since quitting: 31.2  . Smokeless tobacco: Never Used  Substance Use Topics  . Alcohol use: No  . Drug use: No     Family Hx: The patient's family history includes Heart failure in her father and mother; Leukemia in her mother.  ROS:   Please see the history of present illness.     All other systems reviewed and are negative.   Labs/Other Tests and  Data Reviewed:    Recent Labs: 11/18/2018: TSH 3.484 12/24/2018: B Natriuretic Peptide 1,248.0; Hemoglobin 13.3; Platelets 205 12/27/2018: Magnesium 2.2 12/29/2018: ALT 26; BUN 29; Creatinine, Ser 1.31; Potassium 3.9; Sodium 136   Recent Lipid Panel Lab Results  Component Value Date/Time   CHOL 153 05/20/2018 06:06 AM   TRIG 76 05/20/2018 06:06 AM   HDL 38 (L) 05/20/2018 06:06 AM   CHOLHDL 4.0 05/20/2018 06:06 AM   LDLCALC 100 (H) 05/20/2018 06:06 AM    Wt Readings from Last 3 Encounters:  12/29/18 139 lb 15.9 oz (63.5 kg)  12/08/18 143 lb (64.9 kg)  12/04/18 145 lb (65.8 kg)     Objective:    Vital Signs:  There were no vitals taken for this visit.   Normal affect. Normal speech pattern. Sounds comfortable, no acute distress  ASSESSMENT & PLAN:    1. Afib with Tachy-brady syndrome Recently started on amiodarone during admission. Unclear if GI symptoms are related - unable to use av nodal agents due to signfiicant brady at times - EP had considered tikasyn but limited hospital admissions currently due  to COVID-19 - she will hold amio x 3 days, then restart at lower dose 200mg  daily   2. Chronic systolic HF - recent SOB and orthopnea, weights up from 137 lbs to 145 lbs - go back to taking her lasix 20mg  daily, would look to keep weight around 137 lbs where she was discharged at - some of her SOB may also be arrhytmia related   3. Nausea/Reflux - unclear if amio relatedm, follow with lower amio dosing - her pcp is working on her antacids. She had asked me if she could take omeprazole but she is allergic to that according to chart.   COVID-19 Education: The signs and symptoms of COVID-19 were discussed with the patient and how to seek care for testing (follow up with PCP or arrange E-visit).  The importance of social distancing was discussed today.  Patient Risk:   After full review of this patient's clinical status, I feel that they are at least moderate risk at this time.  Time:   Today, I have spent 45 minutes with the patient with telehealth technology discussing the above medical problems. .     Medication Adjustments/Labs and Tests Ordered: Current medicines are reviewed at length with the patient today.  Concerns regarding medicines are outlined above.  Tests Ordered: No orders of the defined types were placed in this encounter.  Medication Changes: No orders of the defined types were placed in this encounter.   Disposition:  Follow up she is to call us Tuesday to update [place] on symptoms, may arrange another viritual visit  Signed, Carlyle Dolly, MD  01/12/2019 10:12 AM    [place]

## 2019-01-12 NOTE — Telephone Encounter (Signed)
Can we set her up as a virtual visit at 1140, telephone is fine   Zandra Abts MD

## 2019-01-12 NOTE — Patient Instructions (Addendum)
Your physician recommends that you schedule a follow-up appointment pending with DR [facility]    Your physician has recommended you make the following change in your medication:   HOLD AMIODARONE Saturday , Sunday , AND Monday   TAKE LASIX 20 MG DAILY UNTIL Tuesday   CALL us ON Tuesday AND UPDATE [place] ON YOUR SYMPTOMS   Thank you for choosing [facility]!!

## 2019-01-15 DIAGNOSIS — J209 Acute bronchitis, unspecified: Secondary | ICD-10-CM | POA: Diagnosis not present

## 2019-01-16 ENCOUNTER — Other Ambulatory Visit: Payer: Self-pay

## 2019-01-16 ENCOUNTER — Encounter: Payer: Self-pay | Admitting: *Deleted

## 2019-01-16 ENCOUNTER — Telehealth: Payer: Self-pay | Admitting: *Deleted

## 2019-01-16 NOTE — Telephone Encounter (Signed)
Says she hasn't had to take nausea medication - says only mild regurgitation and no vomiting at this time

## 2019-01-16 NOTE — Telephone Encounter (Signed)
How are her GI symptoms doing. The regurgitation, the nausea?   Zandra Abts MD

## 2019-01-16 NOTE — Patient Outreach (Signed)
[facility]) Care Management  01/16/2019  [name] Mar 07, 1933 [phone]  Follow up outreach with Carol Hardin. She reports a productive cough and continues to experience exertional dyspnea. Reports checking her oxygen saturation frequently and maintaining a range of 96-97%.  She reports symptom relief after taking Pepcid 20mg . Reports a decreased appetite today but states she was able to tolerate meals without regurgitation. She received an incoming call from Cardiology during outreach. Agreeable to follow-up within a week or sooner if needed.  PLAN Will follow up within a week.   [place] [phone]

## 2019-01-16 NOTE — Telephone Encounter (Signed)
Pt calling to update [place] on symptoms - says Dr Durene Cal office gave her Z pak (was coughing up brown mucus for the last 2 days) started this yesterday - has been taking lasix daily and holding amiodarone - 4/4 weight has been 139-140lbs. BP 4/4 97/66 HR 69 o2 96%. 4/5 BP 86/69 HR 63. 4/5 BP 102/77 HR 67. 4/6 BP 91/70 HR 83 o2 97% - weight today is 141lbs - pt says SOB has remained the same with some tightness in her chest

## 2019-01-17 ENCOUNTER — Other Ambulatory Visit: Payer: Self-pay

## 2019-01-17 NOTE — Telephone Encounter (Signed)
Patient notified and verbalized understanding. 

## 2019-01-17 NOTE — Telephone Encounter (Signed)
Take amio 200mg  daily, continue daily lasix. Let see how a few more days of the fluid pill and also the newly started antibiotic help her breathing and continue to monitor her GI symptoms. Call us back Monday, earlier if significant issues   Zandra Abts MD

## 2019-01-18 ENCOUNTER — Telehealth: Payer: Self-pay | Admitting: Cardiology

## 2019-01-18 NOTE — Telephone Encounter (Signed)
Pt reluctant for ED evaluation with COVID 19 however would talk this over with her husband and have him drive her if she decided to go , explained Dr Nelly Laurence recommendations and pt appreciative

## 2019-01-18 NOTE — Telephone Encounter (Signed)
Pt called requesting to speak w/ you, said she hasn't spoken to you since her last apt, didn't give a reason just wanted you to call her.

## 2019-01-18 NOTE — Telephone Encounter (Signed)
If her symptoms are that severe she needs to come to [facility] ER. We had been trying to avoid this but clearly her symptoms are worsening and getting pretty extreme   Zandra Abts MD

## 2019-01-18 NOTE — Telephone Encounter (Signed)
Pt says she can no longer take amiodarone. Said she was so SOB she almost passed out and had tightness around her chest and makes it hard for her to take a deep breath. When she took last dose of amio yesterday had some heartburn/dry mouth, says heart was beating hard and fast. Denies any nausea yesterday or today. Says she used to take bisoprolol 2.5 mg and diltiazem 30mg  (still has these at home) for HR wanted to know if those medications would be an option.

## 2019-01-20 ENCOUNTER — Emergency Department (HOSPITAL_COMMUNITY): Payer: Medicare Other

## 2019-01-20 ENCOUNTER — Encounter (HOSPITAL_COMMUNITY): Payer: Self-pay | Admitting: Emergency Medicine

## 2019-01-20 ENCOUNTER — Inpatient Hospital Stay (HOSPITAL_COMMUNITY)
Admission: EM | Admit: 2019-01-20 | Discharge: 2019-01-24 | DRG: 242 | Disposition: A | Discharge status: Home Health | Payer: Medicare Other | Attending: Internal Medicine | Admitting: Internal Medicine

## 2019-01-20 ENCOUNTER — Other Ambulatory Visit: Payer: Self-pay

## 2019-01-20 DIAGNOSIS — I5023 Acute on chronic systolic (congestive) heart failure: Secondary | ICD-10-CM | POA: Diagnosis present

## 2019-01-20 DIAGNOSIS — E785 Hyperlipidemia, unspecified: Secondary | ICD-10-CM | POA: Diagnosis present

## 2019-01-20 DIAGNOSIS — I959 Hypotension, unspecified: Secondary | ICD-10-CM | POA: Diagnosis present

## 2019-01-20 DIAGNOSIS — I428 Other cardiomyopathies: Secondary | ICD-10-CM | POA: Diagnosis not present

## 2019-01-20 DIAGNOSIS — I5043 Acute on chronic combined systolic (congestive) and diastolic (congestive) heart failure: Secondary | ICD-10-CM | POA: Diagnosis present

## 2019-01-20 DIAGNOSIS — Z85828 Personal history of other malignant neoplasm of skin: Secondary | ICD-10-CM | POA: Diagnosis not present

## 2019-01-20 DIAGNOSIS — Z9981 Dependence on supplemental oxygen: Secondary | ICD-10-CM | POA: Diagnosis not present

## 2019-01-20 DIAGNOSIS — I4891 Unspecified atrial fibrillation: Secondary | ICD-10-CM | POA: Diagnosis not present

## 2019-01-20 DIAGNOSIS — E876 Hypokalemia: Secondary | ICD-10-CM | POA: Diagnosis not present

## 2019-01-20 DIAGNOSIS — I130 Hypertensive heart and chronic kidney disease with heart failure and stage 1 through stage 4 chronic kidney disease, or unspecified chronic kidney disease: Secondary | ICD-10-CM | POA: Diagnosis present

## 2019-01-20 DIAGNOSIS — F411 Generalized anxiety disorder: Secondary | ICD-10-CM | POA: Diagnosis not present

## 2019-01-20 DIAGNOSIS — Z8589 Personal history of malignant neoplasm of other organs and systems: Secondary | ICD-10-CM

## 2019-01-20 DIAGNOSIS — J9611 Chronic respiratory failure with hypoxia: Secondary | ICD-10-CM

## 2019-01-20 DIAGNOSIS — N183 Chronic kidney disease, stage 3 unspecified: Secondary | ICD-10-CM

## 2019-01-20 DIAGNOSIS — K219 Gastro-esophageal reflux disease without esophagitis: Secondary | ICD-10-CM | POA: Diagnosis present

## 2019-01-20 DIAGNOSIS — I482 Chronic atrial fibrillation, unspecified: Secondary | ICD-10-CM | POA: Diagnosis not present

## 2019-01-20 DIAGNOSIS — Z79899 Other long term (current) drug therapy: Secondary | ICD-10-CM | POA: Diagnosis not present

## 2019-01-20 DIAGNOSIS — I495 Sick sinus syndrome: Secondary | ICD-10-CM | POA: Diagnosis not present

## 2019-01-20 DIAGNOSIS — Z7901 Long term (current) use of anticoagulants: Secondary | ICD-10-CM | POA: Diagnosis not present

## 2019-01-20 DIAGNOSIS — Z950 Presence of cardiac pacemaker: Secondary | ICD-10-CM

## 2019-01-20 DIAGNOSIS — N179 Acute kidney failure, unspecified: Secondary | ICD-10-CM | POA: Diagnosis not present

## 2019-01-20 DIAGNOSIS — Z87891 Personal history of nicotine dependence: Secondary | ICD-10-CM | POA: Diagnosis not present

## 2019-01-20 DIAGNOSIS — J90 Pleural effusion, not elsewhere classified: Secondary | ICD-10-CM | POA: Diagnosis not present

## 2019-01-20 DIAGNOSIS — I10 Essential (primary) hypertension: Secondary | ICD-10-CM | POA: Diagnosis not present

## 2019-01-20 DIAGNOSIS — M810 Age-related osteoporosis without current pathological fracture: Secondary | ICD-10-CM | POA: Diagnosis not present

## 2019-01-20 DIAGNOSIS — E7800 Pure hypercholesterolemia, unspecified: Secondary | ICD-10-CM | POA: Diagnosis not present

## 2019-01-20 DIAGNOSIS — N171 Acute kidney failure with acute cortical necrosis: Secondary | ICD-10-CM | POA: Diagnosis not present

## 2019-01-20 DIAGNOSIS — Z959 Presence of cardiac and vascular implant and graft, unspecified: Secondary | ICD-10-CM | POA: Diagnosis not present

## 2019-01-20 DIAGNOSIS — I4819 Other persistent atrial fibrillation: Secondary | ICD-10-CM | POA: Diagnosis not present

## 2019-01-20 DIAGNOSIS — K761 Chronic passive congestion of liver: Secondary | ICD-10-CM | POA: Diagnosis present

## 2019-01-20 DIAGNOSIS — N184 Chronic kidney disease, stage 4 (severe): Secondary | ICD-10-CM | POA: Diagnosis not present

## 2019-01-20 DIAGNOSIS — I5022 Chronic systolic (congestive) heart failure: Secondary | ICD-10-CM | POA: Diagnosis not present

## 2019-01-20 DIAGNOSIS — I447 Left bundle-branch block, unspecified: Secondary | ICD-10-CM | POA: Diagnosis present

## 2019-01-20 DIAGNOSIS — R918 Other nonspecific abnormal finding of lung field: Secondary | ICD-10-CM | POA: Diagnosis not present

## 2019-01-20 LAB — COMPREHENSIVE METABOLIC PANEL
ALT: 47 U/L — ABNORMAL HIGH (ref 0–44)
AST: 56 U/L — ABNORMAL HIGH (ref 15–41)
Albumin: 4.7 g/dL (ref 3.5–5.0)
Alkaline Phosphatase: 66 U/L (ref 38–126)
Anion gap: 13 (ref 5–15)
BUN: 22 mg/dL (ref 8–23)
CO2: 22 mmol/L (ref 22–32)
Calcium: 9.5 mg/dL (ref 8.9–10.3)
Chloride: 103 mmol/L (ref 98–111)
Creatinine, Ser: 1.31 mg/dL — ABNORMAL HIGH (ref 0.44–1.00)
GFR calc Af Amer: 44 mL/min — ABNORMAL LOW (ref >60)
GFR calc non Af Amer: 38 mL/min — ABNORMAL LOW (ref >60)
Glucose, Bld: 132 mg/dL — ABNORMAL HIGH (ref 70–99)
Potassium: 3.6 mmol/L (ref 3.5–5.1)
Sodium: 138 mmol/L (ref 135–145)
Total Bilirubin: 2.5 mg/dL — ABNORMAL HIGH (ref 0.3–1.2)
Total Protein: 8 g/dL (ref 6.5–8.1)

## 2019-01-20 LAB — CBC WITH DIFFERENTIAL/PLATELET
Abs Immature Granulocytes: 0.02 10*3/uL (ref 0.00–0.07)
Basophils Absolute: 0.1 10*3/uL (ref 0.0–0.1)
Basophils Relative: 1 %
Eosinophils Absolute: 0.1 10*3/uL (ref 0.0–0.5)
Eosinophils Relative: 2 %
HCT: 47.5 % — ABNORMAL HIGH (ref 36.0–46.0)
Hemoglobin: 15.4 g/dL — ABNORMAL HIGH (ref 12.0–15.0)
Immature Granulocytes: 0 %
Lymphocytes Relative: 23 %
Lymphs Abs: 1.7 10*3/uL (ref 0.7–4.0)
MCH: 32 pg (ref 26.0–34.0)
MCHC: 32.4 g/dL (ref 30.0–36.0)
MCV: 98.8 fL (ref 80.0–100.0)
Monocytes Absolute: 0.6 10*3/uL (ref 0.1–1.0)
Monocytes Relative: 8 %
Neutro Abs: 4.9 10*3/uL (ref 1.7–7.7)
Neutrophils Relative %: 66 %
Platelets: 203 10*3/uL (ref 150–400)
RBC: 4.81 MIL/uL (ref 3.87–5.11)
RDW: 14.7 % (ref 11.5–15.5)
WBC: 7.5 10*3/uL (ref 4.0–10.5)
nRBC: 0 % (ref 0.0–0.2)

## 2019-01-20 LAB — TROPONIN I: Troponin I: <0.03 ng/mL (ref <0.03)

## 2019-01-20 LAB — BRAIN NATRIURETIC PEPTIDE: B Natriuretic Peptide: 1638 pg/mL — ABNORMAL HIGH (ref 0.0–100.0)

## 2019-01-20 MED ORDER — POTASSIUM CHLORIDE CRYS ER 20 MEQ PO TBCR
20.0000 meq | EXTENDED_RELEASE_TABLET | Freq: Two times a day (BID) | ORAL | Status: DC
  Administered 2019-01-20 – 2019-01-21 (×3): 20 meq via ORAL
  Filled 2019-01-20 (×3): qty 1

## 2019-01-20 MED ORDER — ORAL CARE MOUTH RINSE
15.0000 mL | Freq: Two times a day (BID) | OROMUCOSAL | Status: DC
  Administered 2019-01-21 – 2019-01-22 (×4): 15 mL via OROMUCOSAL

## 2019-01-20 MED ORDER — ALPRAZOLAM 0.5 MG PO TABS: 1.0000 mg | ORAL_TABLET | Freq: Once | ORAL | Status: DC

## 2019-01-20 MED ORDER — LORAZEPAM 1 MG PO TABS
1.0000 mg | ORAL_TABLET | Freq: Once | ORAL | Status: AC
  Administered 2019-01-20: 1 mg via ORAL
  Filled 2019-01-20: qty 1

## 2019-01-20 MED ORDER — ONDANSETRON HCL 4 MG/2ML IJ SOLN: 4.0000 mg | Freq: Four times a day (QID) | INTRAMUSCULAR | Status: DC | PRN

## 2019-01-20 MED ORDER — BUSPIRONE HCL 5 MG PO TABS
5.0000 mg | ORAL_TABLET | Freq: Three times a day (TID) | ORAL | Status: DC | PRN
  Administered 2019-01-21: 5 mg via ORAL
  Filled 2019-01-20: qty 1

## 2019-01-20 MED ORDER — FUROSEMIDE 10 MG/ML IJ SOLN
40.0000 mg | Freq: Two times a day (BID) | INTRAMUSCULAR | Status: DC
  Administered 2019-01-20: 40 mg via INTRAVENOUS
  Filled 2019-01-20: qty 4

## 2019-01-20 MED ORDER — DILTIAZEM HCL 100 MG IV SOLR
5.0000 mg/h | INTRAVENOUS | Status: DC
  Administered 2019-01-20: 2.5 mg/h via INTRAVENOUS
  Filled 2019-01-20 (×4): qty 100

## 2019-01-20 MED ORDER — APIXABAN 5 MG PO TABS
5.0000 mg | ORAL_TABLET | Freq: Two times a day (BID) | ORAL | Status: DC
  Administered 2019-01-20 – 2019-01-22 (×5): 5 mg via ORAL
  Filled 2019-01-20 (×5): qty 1

## 2019-01-20 MED ORDER — DILTIAZEM LOAD VIA INFUSION
10.0000 mg | Freq: Once | INTRAVENOUS | Status: AC
  Administered 2019-01-20: 10 mg via INTRAVENOUS
  Filled 2019-01-20: qty 10

## 2019-01-20 MED ORDER — FAMOTIDINE 20 MG PO TABS
20.0000 mg | ORAL_TABLET | Freq: Every day | ORAL | Status: DC
  Administered 2019-01-21 – 2019-01-24 (×3): 20 mg via ORAL
  Filled 2019-01-20 (×3): qty 1

## 2019-01-20 MED ORDER — DILTIAZEM HCL 100 MG IV SOLR
INTRAVENOUS | Status: AC
  Filled 2019-01-20: qty 100

## 2019-01-20 MED ORDER — DILTIAZEM HCL 100 MG IV SOLR
5.0000 mg/h | INTRAVENOUS | Status: DC
  Administered 2019-01-20: 5 mg/h via INTRAVENOUS

## 2019-01-20 MED ORDER — POLYETHYLENE GLYCOL 3350 17 G PO PACK: 17.0000 g | PACK | Freq: Every day | ORAL | Status: DC | PRN

## 2019-01-20 MED ORDER — ACETAMINOPHEN 325 MG PO TABS: 650.0000 mg | ORAL_TABLET | ORAL | Status: DC | PRN

## 2019-01-20 NOTE — ED Triage Notes (Signed)
Pt reports Dr Harl Bowie encouraged her to come to the ED the other day   But feels she has an allergy to Amiodarone

## 2019-01-20 NOTE — ED Notes (Signed)
ED TO INPATIENT HANDOFF REPORT  ED Nurse Name and Phone #: [place] [phone]  S Name/Age/Gender Carol Hardin 85 y.o. female Room/Bed: APA03/APA03  Code Status   Code Status: Prior  Home/SNF/Other Home Patient oriented to: self, place, time and situation Is this baseline? Yes   Triage Complete: Triage complete  Chief Complaint Breathing issues  Triage Note  +admitted and discharged for ? A fib mid march   Sent home on amiodarone and states "I cannot tolerate it"  Here due to fast HR and shortness of breath  Pt reports Dr Harl Bowie encouraged her to come to the ED the other day   But feels she has an allergy to Amiodarone    Allergies Allergies  Allergen Reactions  . Omeprazole Other (See Comments)    siezure  . Flagyl [Metronidazole] Nausea And Vomiting  . Aripiprazole Other (See Comments)    Unknown reaction  . Hylan G-F 20 Other (See Comments)    Unknown reaction  . Paroxetine Hcl Other (See Comments)    Headaches (a long time ago- pt doesn't really remember)  . Statins Other (See Comments)    No specific reaction given-patient states that she was advised not to take by physician  . Sulfa Antibiotics Diarrhea and Other (See Comments)    Colitis   . Toprol Xl [Metoprolol Succinate] Other (See Comments)    Dizziness--pt doesn't really remember   . Vancomycin Itching and Other (See Comments)    Pt reports that they gave it too fast- she started having itching in the scalp  . Penicillins Rash    DID THE REACTION INVOLVE: Swelling of the face/tongue/throat, SOB, or low BP? No Sudden or severe rash/hives, skin peeling, or the inside of the mouth or nose? Yes Did it require medical treatment? Unknown When did it last happen?Over 10 years If all above answers are "NO", may proceed with cephalosporin use.     Level of Care/Admitting Diagnosis ED Disposition    ED Disposition Condition [facility] Area: [facility] [[id number]]  Level of  Care: Stepdown [14]  Diagnosis: Atrial fibrillation with RVR [facility]) [[id number]]  Admitting Physician: Truett Mainland [[id number]]  Attending Physician: Truett Mainland [[id number]]  PT Class (Do Not Modify): Observation [104]  PT Acc Code (Do Not Modify): Observation [10022]       B Medical/Surgery History Past Medical History:  Diagnosis Date  . Anxiety   . Arthritis    "fingers, right toe" (08/09/2016)  . Basal cell carcinoma of lower leg, right   . Bradycardia    a. Holter 08/30/16 showed profound bradycardia down to 30 during awake hours, several 2 second pauses, very frequent PVCs >8000 in 48 hours, and NSVT (longest of 14 beats).  . Cardiomyopathy ([place])    a. EF 40% by echo 08/2016.  . Daily headache    "I usually wake up w/a headache; sometimes it's a migraine" (08/09/2016)  . Depression   . Diverticulosis    Hx. of  . Frequent PVCs   . GERD (gastroesophageal reflux disease)   . Hypercholesterolemia   . Migraine   . Mitral regurgitation   . MVP (mitral valve prolapse)   . NSVT (nonsustained ventricular tachycardia) ([place])    a. first noted event monitor 08/2016.  . Osteoporosis   . PAF (paroxysmal atrial fibrillation) ([place])    a. in afib at time of echo 08/2016.  Marland Kitchen Pneumonia   . Scoliosis    mild  . Squamous  cell carcinoma of neck    Past Surgical History:  Procedure Laterality Date  . BASAL CELL CARCINOMA EXCISION Right    RLE  . BLEPHAROPLASTY    . BREAST BIOPSY Left   . BREAST CYST ASPIRATION Left   . BREAST CYST EXCISION Left   . BUNIONECTOMY Bilateral   . DILATION AND CURETTAGE OF UTERUS    . EXCISIONAL HEMORRHOIDECTOMY    . INTRAMEDULLARY (IM) NAIL INTERTROCHANTERIC Left 12/31/2017   Procedure: INTRAMEDULLARY (IM) NAIL INTERTROCHANTRIC;  Surgeon: Nicholes Stairs, MD;  Location: [facility];  Service: Orthopedics;  Laterality: Left;  . KNEE ARTHROSCOPY Right 04/12/2007  . KNEE ARTHROSCOPY Left   . SQUAMOUS CELL CARCINOMA EXCISION     "neck"  . TONSILLECTOMY        A IV Location/Drains/Wounds Patient Lines/Drains/Airways Status   Active Line/Drains/Airways    Name:   Placement date:   Placement time:   Site:   Days:   Peripheral IV 01/20/19 Right Antecubital   01/20/19    1629    Antecubital   less than 1   Incision (Closed) 12/31/17 Thigh   12/31/17    1944     385   Incision (Closed) 12/31/17 Thigh Left;Upper   12/31/17    2011     385   Incision (Closed) 12/31/17 Knee Left   12/31/17    2023     385          Intake/Output Last 24 hours No intake or output data in the 24 hours ending 01/20/19 2056  Labs/Imaging Results for orders placed or performed during the hospital encounter of 01/20/19 (from the past 48 hour(s))  Troponin I - ONCE - STAT     Status: None   Collection Time: 01/20/19  4:37 PM  Result Value Ref Range   Troponin I <0.03 <0.03 ng/mL    Comment: Performed at [facility], [address], [place], [place] [address]  Brain natriuretic peptide     Status: Abnormal   Collection Time: 01/20/19  4:37 PM  Result Value Ref Range   B Natriuretic Peptide 1,638.0 (H) 0.0 - 100.0 pg/mL    Comment: Performed at [facility], [address], [place], [place] [address]  CBC with Differential     Status: Abnormal   Collection Time: 01/20/19  4:37 PM  Result Value Ref Range   WBC 7.5 4.0 - 10.5 K/uL   RBC 4.81 3.87 - 5.11 MIL/uL   Hemoglobin 15.4 (H) 12.0 - 15.0 g/dL   HCT 47.5 (H) 36.0 - 46.0 %   MCV 98.8 80.0 - 100.0 fL   MCH 32.0 26.0 - 34.0 pg   MCHC 32.4 30.0 - 36.0 g/dL   RDW 14.7 11.5 - 15.5 %   Platelets 203 150 - 400 K/uL   nRBC 0.0 0.0 - 0.2 %   Neutrophils Relative % 66 %   Neutro Abs 4.9 1.7 - 7.7 K/uL   Lymphocytes Relative 23 %   Lymphs Abs 1.7 0.7 - 4.0 K/uL   Monocytes Relative 8 %   Monocytes Absolute 0.6 0.1 - 1.0 K/uL   Eosinophils Relative 2 %   Eosinophils Absolute 0.1 0.0 - 0.5 K/uL   Basophils Relative 1 %   Basophils Absolute 0.1 0.0 - 0.1 K/uL   Immature Granulocytes 0 %   Abs Immature  Granulocytes 0.02 0.00 - 0.07 K/uL    Comment: Performed at [facility], [address], [place], [place] [address]  Comprehensive metabolic panel  Status: Abnormal   Collection Time: 01/20/19  4:37 PM  Result Value Ref Range   Sodium 138 135 - 145 mmol/L   Potassium 3.6 3.5 - 5.1 mmol/L   Chloride 103 98 - 111 mmol/L   CO2 22 22 - 32 mmol/L   Glucose, Bld 132 (H) 70 - 99 mg/dL   BUN 22 8 - 23 mg/dL   Creatinine, Ser 1.31 (H) 0.44 - 1.00 mg/dL   Calcium 9.5 8.9 - 10.3 mg/dL   Total Protein 8.0 6.5 - 8.1 g/dL   Albumin 4.7 3.5 - 5.0 g/dL   AST 56 (H) 15 - 41 U/L   ALT 47 (H) 0 - 44 U/L   Alkaline Phosphatase 66 38 - 126 U/L   Total Bilirubin 2.5 (H) 0.3 - 1.2 mg/dL   GFR calc non Af Amer 38 (L) >60 mL/min   GFR calc Af Amer 44 (L) >60 mL/min   Anion gap 13 5 - 15    Comment: Performed at [facility], [address], [place], [place] [address]   Dg Chest 2 View  Result Date: 01/20/2019 CLINICAL DATA:  Recent pneumonia/bronchitis treated with antibiotics. History of atrial fibrillation. EXAM: CHEST - 2 VIEW COMPARISON:  Radiographs 12/24/2018 and 11/20/2018. FINDINGS: 1756 hours. Stable cardiomegaly and aortic atherosclerosis. There are small left-greater-than-right pleural effusions. There are diffuse interstitial opacities in both lungs which are similar to the most recent study, probably reflecting edema. There is no confluent airspace opacity or pneumothorax. The bones appear unchanged. Multiple telemetry leads overlie the chest. IMPRESSION: Overall, little change is seen from the most recent study 4 weeks ago. There is cardiomegaly with diffuse interstitial opacities and left-greater-than-right pleural effusions, most consistent with congestive heart failure or atypical infection. No confluent airspace opacity. Electronically Signed   By: Richardean Sale M.D.   On: 01/20/2019 18:42    Pending Labs [employer] (From admission, onward)    Start     Ordered   Signed and Held   Basic metabolic panel  Tomorrow morning,   R     Signed and Held          Vitals/Pain Today's Vitals   01/20/19 2020 01/20/19 2030 01/20/19 2040 01/20/19 2050  BP: 101/84 106/80 95/69 105/85  Pulse: (!) 50 (!) 38 84 (!) 103  Resp: (!) 23 19 (!) 21 (!) 21  Temp:      TempSrc:      SpO2: 97% 93% 96% 95%  Weight:      Height:      PainSc:        Isolation Precautions No active isolations  Medications Medications  diltiazem (CARDIZEM) 1 mg/mL load via infusion 10 mg (10 mg Intravenous Bolus from Bag 01/20/19 1701)    And  diltiazem (CARDIZEM) 100 mg in dextrose 5 % 100 mL (1 mg/mL) infusion (2.5 mg/hr Intravenous Rate/Dose Change 01/20/19 1854)  furosemide (LASIX) injection 40 mg (40 mg Intravenous Given 01/20/19 2016)    Mobility walks with person assist     Focused Assessments Pulmonary Assessment Handoff:  Lung sounds: Bilateral Breath Sounds: Clear L Breath Sounds: Clear R Breath Sounds: Fine crackles O2 Device: Nasal Cannula O2 Flow Rate (L/min): 2 L/min      R Recommendations: See Admitting Provider Note  Report given to:   Additional Notes: 1 assist up to BR or BSC / got a little winded while walking / continent / cardizem going at 2.5mg /hr due to BP / still in Afib / 40mg  of  lasix IV /

## 2019-01-20 NOTE — H&P (Signed)
History and Physical  Carol Hardin YWV:[id number] DOB: [birth year] DOA: 01/20/2019  Referring physician: Dr Lacinda Axon, ED physician PCP: Celene Squibb, MD  Outpatient Specialists: Dr branch (cardiology)  Patient Coming From: home  Chief Complaint: SOB  HPI: Carol Hardin is a 85 y.o. female with a history of paroxysmal atrial fibrillation on amiodarone, GERD, hypertension, mitral valve prolapse, combined systolic and diastolic heart failure.  Patient seen for 3 to 4 days of worsening shortness of breath that is worse with lying down flat and exertion.  Symptoms are improved with sitting at side of the bed and rest from exertion.  She has been on amiodarone since her last hospitalization for this in February, however she reports side effects to amiodarone.  This was decreased to 200 g once a day on 4/3 during a telehealth visit with Dr. Harl Bowie.  She reports continuing to have side effects.  No other palliating or provoking factors.  Denies fevers, chills, nausea, vomiting.  Does have some tightness in the chest.  Positive orthopnea.  Emergency Department Course: Patient came in with A. fib with RVR with heart rate in the 140s to 150s.  Patient started on Cardizem drip with good results.  Review of Systems:   Pt denies any fevers, chills, nausea, vomiting, diarrhea, constipation, abdominal pain, palpitations, headache, vision changes, lightheadedness, dizziness, melena, rectal bleeding.  Review of systems are otherwise negative  Past Medical History:  Diagnosis Date  . Anxiety   . Arthritis    "fingers, right toe" (08/09/2016)  . Basal cell carcinoma of lower leg, right   . Bradycardia    a. Holter 08/30/16 showed profound bradycardia down to 30 during awake hours, several 2 second pauses, very frequent PVCs >8000 in 48 hours, and NSVT (longest of 14 beats).  . Cardiomyopathy ([place])    a. EF 40% by echo 08/2016.  . Daily headache    "I usually wake up w/a headache; sometimes it's a  migraine" (08/09/2016)  . Depression   . Diverticulosis    Hx. of  . Frequent PVCs   . GERD (gastroesophageal reflux disease)   . Hypercholesterolemia   . Migraine   . Mitral regurgitation   . MVP (mitral valve prolapse)   . NSVT (nonsustained ventricular tachycardia) ([place])    a. first noted event monitor 08/2016.  . Osteoporosis   . PAF (paroxysmal atrial fibrillation) ([place])    a. in afib at time of echo 08/2016.  Marland Kitchen Pneumonia   . Scoliosis    mild  . Squamous cell carcinoma of neck    Past Surgical History:  Procedure Laterality Date  . BASAL CELL CARCINOMA EXCISION Right    RLE  . BLEPHAROPLASTY    . BREAST BIOPSY Left   . BREAST CYST ASPIRATION Left   . BREAST CYST EXCISION Left   . BUNIONECTOMY Bilateral   . DILATION AND CURETTAGE OF UTERUS    . EXCISIONAL HEMORRHOIDECTOMY    . INTRAMEDULLARY (IM) NAIL INTERTROCHANTERIC Left 12/31/2017   Procedure: INTRAMEDULLARY (IM) NAIL INTERTROCHANTRIC;  Surgeon: Nicholes Stairs, MD;  Location: [facility];  Service: Orthopedics;  Laterality: Left;  . KNEE ARTHROSCOPY Right 04/12/2007  . KNEE ARTHROSCOPY Left   . SQUAMOUS CELL CARCINOMA EXCISION     "neck"  . TONSILLECTOMY     Social History:  reports that she quit smoking about 31 years ago. Her smoking use included cigarettes. She has a 20.00 pack-year smoking history. She has never used smokeless tobacco. She reports that she  does not drink alcohol or use drugs. Patient lives at home  Allergies  Allergen Reactions  . Omeprazole Other (See Comments)    siezure  . Flagyl [Metronidazole] Nausea And Vomiting  . Aripiprazole Other (See Comments)    Unknown reaction  . Hylan G-F 20 Other (See Comments)    Unknown reaction  . Paroxetine Hcl Other (See Comments)    Headaches (a long time ago- pt doesn't really remember)  . Statins Other (See Comments)    No specific reaction given-patient states that she was advised not to take by physician  . Sulfa Antibiotics Diarrhea and  Other (See Comments)    Colitis   . Toprol Xl [Metoprolol Succinate] Other (See Comments)    Dizziness--pt doesn't really remember   . Vancomycin Itching and Other (See Comments)    Pt reports that they gave it too fast- she started having itching in the scalp  . Penicillins Rash    DID THE REACTION INVOLVE: Swelling of the face/tongue/throat, SOB, or low BP? No Sudden or severe rash/hives, skin peeling, or the inside of the mouth or nose? Yes Did it require medical treatment? Unknown When did it last happen?Over 10 years If all above answers are "NO", may proceed with cephalosporin use.     Family History  Problem Relation Age of Onset  . Heart failure Mother   . Leukemia Mother   . Heart failure Father       Prior to Admission medications   Medication Sig Start Date End Date Taking? Authorizing Provider  acetaminophen (TYLENOL) 325 MG tablet Take 2 tablets (650 mg total) by mouth every 4 (four) hours as needed for headache or mild pain. 12/29/18  Yes Emokpae, Courage, MD  amiodarone (PACERONE) 200 MG tablet Take 1 tablet (200 mg total) by mouth daily. Patient taking differently: Take 200 mg by mouth 2 (two) times daily.  01/10/19  Yes Branch, Alphonse Guild, MD  apixaban (ELIQUIS) 5 MG TABS tablet Take 1 tablet (5 mg total) by mouth 2 (two) times daily. 11/03/18  Yes Allred, Jeneen Rinks, MD  busPIRone (BUSPAR) 5 MG tablet Take 5 mg by mouth 3 (three) times daily as needed (anxiety).   Yes [provider]  diltiazem (CARDIZEM) 30 MG tablet Take 30 mg by mouth every 12 (twelve) hours as needed.   Yes [provider]  Emollient ([employer] HAND & BODY) LOTN Apply 1 application topically daily as needed.   Yes [provider]  famotidine (PEPCID) 20 MG tablet Take 20 mg by mouth daily.   Yes [provider]  furosemide (LASIX) 20 MG tablet Take 1 tablet (20 mg total) by mouth every Tuesday, Thursday, Saturday, and Sunday. 12/30/18  Yes Emokpae, Courage, MD   polyethylene glycol (MIRALAX / GLYCOLAX) packet Take 17 g by mouth daily as needed for mild constipation.    Yes [provider]  feeding supplement, ENSURE ENLIVE, (ENSURE ENLIVE) LIQD Take 237 mLs by mouth 2 (two) times daily between meals. Patient not taking: Reported on 01/20/2019 11/22/18   [name], DO  LORazepam (ATIVAN) 1 MG tablet Take 1 tablet (1 mg total) at bedtime by mouth. 1  qhs  Half  qam Patient not taking: Reported on 01/20/2019 08/17/17   [name], MD  ondansetron (ZOFRAN ODT) 4 MG disintegrating tablet Take 1 tablet (4 mg total) by mouth every 8 (eight) hours as needed for nausea or vomiting. Patient not taking: Reported on 01/20/2019 12/29/18   [name], MD  Physical Exam: BP 106/84   Pulse (!) 111   Temp (!) 97.5 F (36.4 C) (Oral)   Resp 19   Ht 5\' 5"  (1.651 m)   Wt 63.5 kg   SpO2 96%   BMI 23.30 kg/m   . General: Elderly Caucasian female. Awake and alert and oriented x3. No acute cardiopulmonary distress.  Marland Kitchen HEENT: Normocephalic atraumatic.  Right and left ears normal in appearance.  Pupils equal, round, reactive to light. Extraocular muscles are intact. Sclerae anicteric and noninjected.  Moist mucosal membranes. No mucosal lesions.  . Neck: Neck supple without lymphadenopathy. No carotid bruits. No masses palpated.  . Cardiovascular: Irregularly irregular rate. No murmurs, rubs, gallops auscultated. No JVD.  Marland Kitchen Respiratory: Good respiratory effort with no wheezes, rales, rhonchi. Lungs clear to auscultation bilaterally.  No accessory muscle use. . Abdomen: Soft, nontender, nondistended. Active bowel sounds. No masses or hepatosplenomegaly  . Skin: No rashes, lesions, or ulcerations.  Dry, warm to touch. 2+ dorsalis pedis and radial pulses. . Musculoskeletal: No calf or leg pain. All major joints not erythematous nontender.  No upper or lower joint deformation.  Good ROM.  No contractures  . Psychiatric: Intact judgment and insight.  Pleasant and cooperative. . Neurologic: No focal neurological deficits. Strength is 5/5 and symmetric in upper and lower extremities.  Cranial nerves II through XII are grossly intact.           Labs on Admission: I have personally reviewed following labs and imaging studies  CBC: Recent Labs  Lab 01/20/19 1637  WBC 7.5  NEUTROABS 4.9  HGB 15.4*  HCT 47.5*  MCV 98.8  PLT 258   Basic Metabolic Panel: Recent Labs  Lab 01/20/19 1637  NA 138  K 3.6  CL 103  CO2 22  GLUCOSE 132*  BUN 22  CREATININE 1.31*  CALCIUM 9.5   GFR: Estimated Creatinine Clearance: 29.3 mL/min (A) (by C-G formula based on SCr of 1.31 mg/dL (H)). Liver Function Tests: Recent Labs  Lab 01/20/19 1637  AST 56*  ALT 47*  ALKPHOS 66  BILITOT 2.5*  PROT 8.0  ALBUMIN 4.7   No results for input(s): LIPASE, AMYLASE in the last 168 hours. No results for input(s): AMMONIA in the last 168 hours. Coagulation Profile: No results for input(s): INR, PROTIME in the last 168 hours. Cardiac Enzymes: Recent Labs  Lab 01/20/19 1637  TROPONINI <0.03   BNP (last 3 results) No results for input(s): PROBNP in the last 8760 hours. HbA1C: No results for input(s): HGBA1C in the last 72 hours. CBG: No results for input(s): GLUCAP in the last 168 hours. Lipid Profile: No results for input(s): CHOL, HDL, LDLCALC, TRIG, CHOLHDL, LDLDIRECT in the last 72 hours. Thyroid Function Tests: No results for input(s): TSH, T4TOTAL, FREET4, T3FREE, THYROIDAB in the last 72 hours. Anemia Panel: No results for input(s): VITAMINB12, FOLATE, FERRITIN, TIBC, IRON, RETICCTPCT in the last 72 hours. Urine analysis:    Component Value Date/Time   COLORURINE COLORLESS (A) 12/24/2018 1650   APPEARANCEUR CLEAR 12/24/2018 1650   LABSPEC 1.004 (L) 12/24/2018 1650   PHURINE 7.0 12/24/2018 1650   GLUCOSEU NEGATIVE 12/24/2018 1650   HGBUR NEGATIVE 12/24/2018 1650   BILIRUBINUR NEGATIVE 12/24/2018 1650   KETONESUR NEGATIVE  12/24/2018 1650   PROTEINUR NEGATIVE 12/24/2018 1650   UROBILINOGEN 0.2 08/16/2008 2359   NITRITE NEGATIVE 12/24/2018 1650   LEUKOCYTESUR NEGATIVE 12/24/2018 1650   Sepsis Labs: @LABRCNTIP (procalcitonin:4,lacticidven:4) )No results found for this or any previous visit (from the past  240 hour(s)).   Radiological Exams on Admission: Dg Chest 2 View  Result Date: 01/20/2019 CLINICAL DATA:  Recent pneumonia/bronchitis treated with antibiotics. History of atrial fibrillation. EXAM: CHEST - 2 VIEW COMPARISON:  Radiographs 12/24/2018 and 11/20/2018. FINDINGS: 1756 hours. Stable cardiomegaly and aortic atherosclerosis. There are small left-greater-than-right pleural effusions. There are diffuse interstitial opacities in both lungs which are similar to the most recent study, probably reflecting edema. There is no confluent airspace opacity or pneumothorax. The bones appear unchanged. Multiple telemetry leads overlie the chest. IMPRESSION: Overall, little change is seen from the most recent study 4 weeks ago. There is cardiomegaly with diffuse interstitial opacities and left-greater-than-right pleural effusions, most consistent with congestive heart failure or atypical infection. No confluent airspace opacity. Electronically Signed   By: Richardean Sale M.D.   On: 01/20/2019 18:42    EKG: Independently reviewed.  A. fib with RVR left bundle branch block.  Assessment/Plan: Principal Problem:   Atrial fibrillation with RVR (HCC) Active Problems:   Essential hypertension   GERD (gastroesophageal reflux disease)   Acute on chronic systolic CHF (congestive heart failure) ([place])    This patient was discussed with the ED physician, including pertinent vitals, physical exam findings, labs, and imaging.  We also discussed care given by the ED provider.  1. A. fib with RVR a. Cardizem drip b. Continue Eliquis c. TSH normal d. Other labs normal e. Hold amiodarone for now 2. Hypertension a. Continue  antihypertensives 3. GERD 4. Acute on chronic systolic heart failure a. 40 mg Lasix IV b. Potassium supplement c. Check BMP tomorrow d. Patient had recent echo -heart failure likely secondary to A. fib with RVR.  Will not repeat at this point  DVT prophylaxis: On Eliquis Consultants: None Code Status: Full code Family Communication: None Disposition Plan: Patient to return home following resolution   Truett Mainland, DO

## 2019-01-20 NOTE — ED Triage Notes (Signed)
+  admitted and discharged for ? A fib mid march   Sent home on amiodarone and states "I cannot tolerate it"  Here due to fast HR and shortness of breath

## 2019-01-20 NOTE — ED Notes (Signed)
Spec to lab

## 2019-01-20 NOTE — ED Provider Notes (Signed)
[facility] EMERGENCY DEPARTMENT Provider Note   CSN: [id number] Arrival date & time: 01/20/19  1613    History   Chief Complaint Chief Complaint  Patient presents with   Shortness of Breath    HPI Carol Hardin is a 85 y.o. female.     Level 5 caveat for acuity of condition.  Patient presents with sensation of a rapid heart rate and dyspnea along with some chest tightness for unknown length of time.  She was admitted to the hospital on 12/24/18 with acute on chronic congestive heart failure, atrial fibrillation, other cardiac related diagnoses.  Severity of symptoms is moderate.  Exertion makes symptoms worse     Past Medical History:  Diagnosis Date   Anxiety    Arthritis    "fingers, right toe" (08/09/2016)   Basal cell carcinoma of lower leg, right    Bradycardia    a. Holter 08/30/16 showed profound bradycardia down to 30 during awake hours, several 2 second pauses, very frequent PVCs >8000 in 48 hours, and NSVT (longest of 14 beats).   Cardiomyopathy ([place])    a. EF 40% by echo 08/2016.   Daily headache    "I usually wake up w/a headache; sometimes it's a migraine" (08/09/2016)   Depression    Diverticulosis    Hx. of   Frequent PVCs    GERD (gastroesophageal reflux disease)    Hypercholesterolemia    Migraine    Mitral regurgitation    MVP (mitral valve prolapse)    NSVT (nonsustained ventricular tachycardia) ([place])    a. first noted event monitor 08/2016.   Osteoporosis    PAF (paroxysmal atrial fibrillation) ([place])    a. in afib at time of echo 08/2016.   Pneumonia    Scoliosis    mild   Squamous cell carcinoma of neck     Patient Active Problem List   Diagnosis Date Noted   Acute on chronic systolic CHF (congestive heart failure) ([place]) 12/25/2018   Tachy-brady syndrome ([place]) 12/25/2018   Atrial fibrillation, chronic 12/24/2018   Congestive heart failure with left ventricular diastolic dysfunction, unspecified failure  chronicity ([place]) 12/24/2018   Malnutrition of moderate degree 11/20/2018   Atrial fibrillation with rapid ventricular response ([place]) 11/18/2018   GERD (gastroesophageal reflux disease) 11/18/2018   CAP (community acquired pneumonia) 11/18/2018   Acute on chronic diastolic HF (heart failure) ([place]) 11/18/2018   Stroke (cerebrum) ([place]) 05/19/2018   Hip fracture ([place]) 12/31/2017   PAF (paroxysmal atrial fibrillation) (HCC)    NSVT (nonsustained ventricular tachycardia) (HCC)    Mitral regurgitation    Frequent PVCs    Bradycardia    Essential hypertension    Hypokalemia    Diverticulitis 08/09/2016   Diverticulosis of colon 04/22/2016   Diverticulosis of large intestine without perforation or abscess without bleeding 04/22/2016   Generalized anxiety disorder 04/22/2016   Irritable bowel syndrome without diarrhea 04/22/2016   Mitral valve prolapse 04/22/2016   Mixed hyperlipidemia 04/22/2016   Osteopenia 04/22/2016   Low blood pressure 09/24/2014   Hypercholesterolemia    History of depression    Scoliosis    Osteoporosis    DYSPNEA 05/31/2008    Past Surgical History:  Procedure Laterality Date   BASAL CELL CARCINOMA EXCISION Right    RLE   BLEPHAROPLASTY     BREAST BIOPSY Left    BREAST CYST ASPIRATION Left    BREAST CYST EXCISION Left    BUNIONECTOMY Bilateral    DILATION AND CURETTAGE OF UTERUS  EXCISIONAL HEMORRHOIDECTOMY     INTRAMEDULLARY (IM) NAIL INTERTROCHANTERIC Left 12/31/2017   Procedure: INTRAMEDULLARY (IM) NAIL INTERTROCHANTRIC;  Surgeon: Nicholes Stairs, MD;  Location: [facility];  Service: Orthopedics;  Laterality: Left;   KNEE ARTHROSCOPY Right 04/12/2007   KNEE ARTHROSCOPY Left    SQUAMOUS CELL CARCINOMA EXCISION     "neck"   TONSILLECTOMY       OB History    Gravida  6   Para  4   Term  4   Preterm      AB  2   Living        SAB  2   TAB      Ectopic      Multiple      Live Births                Home Medications    Prior to Admission medications   Medication Sig Start Date End Date Taking? Authorizing Provider  acetaminophen (TYLENOL) 325 MG tablet Take 2 tablets (650 mg total) by mouth every 4 (four) hours as needed for headache or mild pain. 12/29/18  Yes Emokpae, Courage, MD  amiodarone (PACERONE) 200 MG tablet Take 1 tablet (200 mg total) by mouth daily. Patient taking differently: Take 200 mg by mouth 2 (two) times daily.  01/10/19  Yes Branch, Alphonse Guild, MD  apixaban (ELIQUIS) 5 MG TABS tablet Take 1 tablet (5 mg total) by mouth 2 (two) times daily. 11/03/18  Yes Allred, Jeneen Rinks, MD  busPIRone (BUSPAR) 5 MG tablet Take 5 mg by mouth 3 (three) times daily as needed (anxiety).   Yes [provider]  diltiazem (CARDIZEM) 30 MG tablet Take 30 mg by mouth every 12 (twelve) hours as needed.   Yes [provider]  Emollient ([place] [employer] HAND & BODY) LOTN Apply 1 application topically daily as needed.   Yes [provider]  famotidine (PEPCID) 20 MG tablet Take 20 mg by mouth daily.   Yes [provider]  furosemide (LASIX) 20 MG tablet Take 1 tablet (20 mg total) by mouth every Tuesday, Thursday, Saturday, and Sunday. 12/30/18  Yes Emokpae, Courage, MD  polyethylene glycol (MIRALAX / GLYCOLAX) packet Take 17 g by mouth daily as needed for mild constipation.    Yes [provider]  feeding supplement, ENSURE ENLIVE, (ENSURE ENLIVE) LIQD Take 237 mLs by mouth 2 (two) times daily between meals. Patient not taking: Reported on 01/20/2019 11/22/18   Carol Lark D, DO  LORazepam (ATIVAN) 1 MG tablet Take 1 tablet (1 mg total) at bedtime by mouth. 1  qhs  Half  qam Patient not taking: Reported on 01/20/2019 08/17/17   Carol Fredrickson, MD  ondansetron (ZOFRAN ODT) 4 MG disintegrating tablet Take 1 tablet (4 mg total) by mouth every 8 (eight) hours as needed for nausea or vomiting. Patient not taking: Reported on 01/20/2019 12/29/18   [name], MD    Family History Family History  Problem Relation Age of Onset   Heart failure Mother    Leukemia Mother    Heart failure Father     Social History Social History   Tobacco Use   Smoking status: Former Smoker    Packs/day: 1.00    Years: 20.00    Pack years: 20.00    Types: Cigarettes    Last attempt to quit: 1989    Years since quitting: 31.2   Smokeless tobacco: Never Used  Substance Use Topics   Alcohol use: No  Drug use: No     Allergies   Omeprazole; Flagyl [metronidazole]; Aripiprazole; Hylan g-f 20; Paroxetine hcl; Statins; Sulfa antibiotics; Toprol xl [metoprolol succinate]; Vancomycin; and Penicillins   Review of Systems Review of Systems  Unable to perform ROS: Acuity of condition     Physical Exam Updated Vital Signs BP 96/80    Pulse (!) 40    Temp (!) 97.5 F (36.4 C) (Oral)    Resp 16    Ht 5\' 5"  (1.651 m)    Wt 63.5 kg    SpO2 99%    BMI 23.30 kg/m   Physical Exam Vitals signs and nursing note reviewed.  Constitutional:      Appearance: She is well-developed.     Comments: nad  HENT:     Head: Normocephalic and atraumatic.  Eyes:     Conjunctiva/sclera: Conjunctivae normal.  Neck:     Musculoskeletal: Neck supple.  Cardiovascular:     Rate and Rhythm: Regular rhythm. Tachycardia present.     Comments: Irregularly irregular Pulmonary:     Effort: Pulmonary effort is normal.     Breath sounds: Normal breath sounds.  Abdominal:     General: Bowel sounds are normal.     Palpations: Abdomen is soft.  Musculoskeletal: Normal range of motion.  Skin:    General: Skin is warm and dry.  Neurological:     Mental Status: She is alert and oriented to person, place, and time.  Psychiatric:        Behavior: Behavior normal.      ED Treatments / Results  Labs (all labs ordered are listed, but only abnormal results are displayed) Labs Reviewed  BRAIN NATRIURETIC PEPTIDE - Abnormal; Notable for the following components:       Result Value   B Natriuretic Peptide 1,638.0 (*)    All other components within normal limits  CBC WITH DIFFERENTIAL/PLATELET - Abnormal; Notable for the following components:   Hemoglobin 15.4 (*)    HCT 47.5 (*)    All other components within normal limits  COMPREHENSIVE METABOLIC PANEL - Abnormal; Notable for the following components:   Glucose, Bld 132 (*)    Creatinine, Ser 1.31 (*)    AST 56 (*)    ALT 47 (*)    Total Bilirubin 2.5 (*)    GFR calc non Af Amer 38 (*)    GFR calc Af Amer 44 (*)    All other components within normal limits  TROPONIN I    EKG EKG Interpretation  Date/Time:  Saturday January 20 2019 16:52:37 EDT Ventricular Rate:  137 PR Interval:    QRS Duration: 134 QT Interval:  355 QTC Calculation: 536 R Axis:   -59 Text Interpretation:  Atrial fibrillation Left bundle branch block Confirmed by Nat Christen [id number]) on 01/20/2019 6:15:56 PM   Radiology Dg Chest 2 View  Result Date: 01/20/2019 CLINICAL DATA:  Recent pneumonia/bronchitis treated with antibiotics. History of atrial fibrillation. EXAM: CHEST - 2 VIEW COMPARISON:  Radiographs 12/24/2018 and 11/20/2018. FINDINGS: 1756 hours. Stable cardiomegaly and aortic atherosclerosis. There are small left-greater-than-right pleural effusions. There are diffuse interstitial opacities in both lungs which are similar to the most recent study, probably reflecting edema. There is no confluent airspace opacity or pneumothorax. The bones appear unchanged. Multiple telemetry leads overlie the chest. IMPRESSION: Overall, little change is seen from the most recent study 4 weeks ago. There is cardiomegaly with diffuse interstitial opacities and left-greater-than-right pleural effusions, most consistent with congestive heart failure or  atypical infection. No confluent airspace opacity. Electronically Signed   By: Richardean Sale M.Hardin.   On: 01/20/2019 18:42    Procedures Procedures (including critical care  time)  Medications Ordered in ED Medications  diltiazem (CARDIZEM) 1 mg/mL load via infusion 10 mg (10 mg Intravenous Bolus from Bag 01/20/19 1701)    And  diltiazem (CARDIZEM) 100 mg in dextrose 5 % 100 mL (1 mg/mL) infusion (2.5 mg/hr Intravenous Rate/Dose Change 01/20/19 1854)     Initial Impression / Assessment and Plan / ED Course  I have reviewed the triage vital signs and the nursing notes.  Pertinent labs & imaging results that were available during my care of the patient were reviewed by me and considered in my medical decision making (see chart for details).       Patient presents with a sense of tachycardia, dyspnea, chest tightness.  EKG reveals atrial fibrillation with RVR rate 130-150.  Chest x-ray no major change from previous.  Cardizem bolus and drip initiated.  Rate now less than 100.  Patient feels much better p initiation of calcium channel blocker.  Will admit to general medicine.   CRITICAL CARE Performed by: Nat Christen Total critical care time: 30 minutes Critical care time was exclusive of separately billable procedures and treating other patients. Critical care was necessary to treat or prevent imminent or life-threatening deterioration. Critical care was time spent personally by me on the following activities: development of treatment plan with patient and/or surrogate as well as nursing, discussions with consultants, evaluation of patient's response to treatment, examination of patient, obtaining history from patient or surrogate, ordering and performing treatments and interventions, ordering and review of laboratory studies, ordering and review of radiographic studies, pulse oximetry and re-evaluation of patient's condition.   Final Clinical Impressions(s) / ED Diagnoses   Final diagnoses:  Atrial fibrillation with RVR [facility])    ED Discharge Orders    None       Nat Christen, MD 01/20/19 1921

## 2019-01-20 NOTE — ED Notes (Signed)
O2, 2L applied for comfort and decreased work of breathing

## 2019-01-21 DIAGNOSIS — E876 Hypokalemia: Secondary | ICD-10-CM

## 2019-01-21 DIAGNOSIS — I482 Chronic atrial fibrillation, unspecified: Secondary | ICD-10-CM

## 2019-01-21 DIAGNOSIS — I4891 Unspecified atrial fibrillation: Secondary | ICD-10-CM

## 2019-01-21 DIAGNOSIS — N183 Chronic kidney disease, stage 3 unspecified: Secondary | ICD-10-CM

## 2019-01-21 DIAGNOSIS — I5023 Acute on chronic systolic (congestive) heart failure: Secondary | ICD-10-CM

## 2019-01-21 DIAGNOSIS — F411 Generalized anxiety disorder: Secondary | ICD-10-CM

## 2019-01-21 DIAGNOSIS — N179 Acute kidney failure, unspecified: Secondary | ICD-10-CM

## 2019-01-21 LAB — HEPATIC FUNCTION PANEL
ALT: 33 U/L (ref 0–44)
AST: 34 U/L (ref 15–41)
Albumin: 3.4 g/dL — ABNORMAL LOW (ref 3.5–5.0)
Alkaline Phosphatase: 46 U/L (ref 38–126)
Bilirubin, Direct: 0.3 mg/dL — ABNORMAL HIGH (ref 0.0–0.2)
Indirect Bilirubin: 1.5 mg/dL — ABNORMAL HIGH (ref 0.3–0.9)
Total Bilirubin: 1.8 mg/dL — ABNORMAL HIGH (ref 0.3–1.2)
Total Protein: 5.7 g/dL — ABNORMAL LOW (ref 6.5–8.1)

## 2019-01-21 LAB — BASIC METABOLIC PANEL
Anion gap: 11 (ref 5–15)
BUN: 21 mg/dL (ref 8–23)
CO2: 26 mmol/L (ref 22–32)
Calcium: 8.6 mg/dL — ABNORMAL LOW (ref 8.9–10.3)
Chloride: 104 mmol/L (ref 98–111)
Creatinine, Ser: 1.3 mg/dL — ABNORMAL HIGH (ref 0.44–1.00)
GFR calc Af Amer: 44 mL/min — ABNORMAL LOW (ref >60)
GFR calc non Af Amer: 38 mL/min — ABNORMAL LOW (ref >60)
Glucose, Bld: 83 mg/dL (ref 70–99)
Potassium: 3.4 mmol/L — ABNORMAL LOW (ref 3.5–5.1)
Sodium: 141 mmol/L (ref 135–145)

## 2019-01-21 LAB — LACTATE DEHYDROGENASE: LDH: 206 U/L — ABNORMAL HIGH (ref 98–192)

## 2019-01-21 MED ORDER — NEBIVOLOL HCL 5 MG PO TABS
5.0000 mg | ORAL_TABLET | Freq: Every day | ORAL | Status: DC
  Filled 2019-01-21: qty 1

## 2019-01-21 MED ORDER — DILTIAZEM HCL-DEXTROSE 100-5 MG/100ML-% IV SOLN (PREMIX)
5.0000 mg/h | INTRAVENOUS | Status: DC
  Administered 2019-01-21 – 2019-01-22 (×2): 2.5 mg/h via INTRAVENOUS
  Filled 2019-01-21: qty 100

## 2019-01-21 MED ORDER — FUROSEMIDE 10 MG/ML IJ SOLN
40.0000 mg | Freq: Once | INTRAMUSCULAR | Status: AC
  Administered 2019-01-21: 40 mg via INTRAVENOUS
  Filled 2019-01-21: qty 4

## 2019-01-21 MED ORDER — FUROSEMIDE 10 MG/ML IJ SOLN: 40.0000 mg | Freq: Every day | INTRAMUSCULAR | Status: DC

## 2019-01-21 NOTE — Progress Notes (Addendum)
PROGRESS NOTE  Carol Hardin IDP:[id number] DOB: 06-18-1933 DOA: 01/20/2019 PCP: Celene Squibb, MD  Brief History:  85 year old female with a history of tachybradycardia syndrome,Chronic atrial fibrillation,chronic atrial fibrillation, generalized anxiety disorder presenting with 3-4 day history of nonproductive cough and shortness of breath. The patient denied any fevers, chills, hemoptysis, recent travels or sick contacts. She has noted increasing lower extremity edema. She has been compliant with social distancing measures in the context of the coronavirus pandemic and she denies any COVID positive or other sick contacts.  She had a recent hospital admit from 12/24/18 to 12/29/18 during which she was treated from acute on chronic systolic CHF and exacerbation of her tachy-brady syndrome.  She was started on an amiodarone drip and ultimately discharged on amiodarone 200 mg bid.  Her discharge weight was 137 lbs.  It has been documented by cardiology and EP that the patient likely needs to be initiated on Tikosyn.  Because of a variety of factors (pt delay in decision making, delay due to dental work, and now the current COVID-19 pandemic), Tikosyn initiation has been delayed. The patient's amiodarone was decreased to 200 mg once daily on 01/10/19 due to possible side effects (nausea, heartburn, regurgitation after eating, abdominal discomfort).   Patient had a virtual visit with Dr. Harl Bowie on 01/12/19.  She was instructed to hold amio x 3 days and restart lasix 20 mg po daily.  Since decreasing amio to once daily, she has noted increasing sob and LE edema.  She had another recent admission from 11/18/2018 through 11/22/2018 during which she was treated for pneumonia and tachybradycardia syndrome. Her discharge weight was 138.2pounds.  In the ED, the patient was afebrile and hemodynamically stable with heart rate 140s.  The patient was started on diltiazem drip with improvement of her heart  rate.  She was admitted to the stepdown unit for further treatment and evaluation. BNP was noted to be 1638.  Chest x-ray bilateral increased interstitial markings.  The patient was initiated on IV furosemide.  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 -daily weights  Tachybradycardia syndrome with atrial fibrillation (chronic) -Discontinued bisoprololdue to hypotension last admission -12/26/18 admission--started amiodarone drip>>> transitioned po amio 200 bid at d/c -Continue apixaban -very long discussion with patient regarding restarting amiodarone drip today (due to continued RVR with very soft BPs) and consult cardiology in am 4/13 -pt is adamant that amiodarone caused intolerable side effects and caused her worsening sob--does not want IV amio at this time -discussed transfer to [facility] for cardiology evaluation for other options with which patient agreed -01/21/19--case discussed with Cardiology, Dr. Margaretann Loveless, who graciously agreed to see patient after transfer  Generalized anxiety disorder -Continue home dose Ativan -may be playing a role in the patient's   CKD stage III -Baseline creatinine 1.0-1.3 -am BMP  Hypokalemia -replete -check mag  Hyperbilirubinemia -mostly indirect -doubt hemolysis -check LDH, haptoglobin -trending down -likely a component of Gilbert's -am LFTs   Disposition Plan:   Transfer to [facility]  Family Communication:  Spouse updated on phone 4/12  Consultants:  cardiology  Code Status:  FULL  DVT Prophylaxis:  apixaban   Procedures: As Listed in Progress Note Above  Antibiotics: None  The patient is critically ill with multiple organ systems  failure and requires high complexity decision making for assessment and support, frequent evaluation and titration of therapies, application of advanced monitoring technologies and extensive interpretation of multiple databases.  Critical care time - 45 mins.       Subjective: Pt is breathing better, but still has some dyspnea.  Denies cp, f/c, n/v/d, abdominal pain, headache, sore throat  Objective: Vitals:   01/21/19 0630 01/21/19 0645 01/21/19 0700 01/21/19 0708  BP: 92/63 96/67 (!) 83/56   Pulse: 75 68 (!) 38 (!) 35  Resp: 16 19 16 16   Temp:    98.3 F (36.8 C)  TempSrc:    Oral  SpO2: 93% 94% 91% 93%  Weight:      Height:        Intake/Output Summary (Last 24 hours) at 01/21/2019 0750 Last data filed at 01/21/2019 0500 Gross per 24 hour  Intake 43.75 ml  Output 1800 ml  Net -1756.25 ml   Weight change:  Exam:   General:  Pt is alert, follows commands appropriately, not in acute distress  HEENT: No icterus, No thrush, No neck mass, [place]/AT  Cardiovascular: IRRR, S1/S2, no rubs, no gallops +JVD  Respiratory: bibasilar crackles, no wheeze  Abdomen: Soft/+BS, non tender, non distended, no guarding  Extremities: 1 +LE edema, No lymphangitis, No petechiae, No rashes, no synovitis   Data Reviewed: I have personally reviewed following labs and imaging studies Basic Metabolic Panel: Recent Labs  Lab 01/20/19 1637 01/21/19 0409  NA 138 141  K 3.6 3.4*  CL 103 104  CO2 22 26  GLUCOSE 132* 83  BUN 22 21  CREATININE 1.31* 1.30*  CALCIUM 9.5 8.6*   Liver Function Tests: Recent Labs  Lab 01/20/19 1637 01/21/19 0409  AST 56* 34  ALT 47* 33  ALKPHOS 66 46  BILITOT 2.5* 1.8*  PROT 8.0 5.7*  ALBUMIN 4.7 3.4*   No results for input(s): LIPASE, AMYLASE in the last 168 hours. No results for input(s): AMMONIA in the last 168 hours. Coagulation Profile: No results for input(s): INR, PROTIME in the last 168 hours. CBC: Recent Labs  Lab 01/20/19 1637  WBC  7.5  NEUTROABS 4.9  HGB 15.4*  HCT 47.5*  MCV 98.8  PLT 203   Cardiac Enzymes: Recent Labs  Lab 01/20/19 1637  TROPONINI <0.03   BNP: Invalid input(s): POCBNP CBG: No results for input(s): GLUCAP in the last 168 hours. HbA1C: No results for input(s): HGBA1C in the last 72 hours. Urine analysis:    Component Value Date/Time   COLORURINE COLORLESS (A) 12/24/2018 1650   APPEARANCEUR CLEAR 12/24/2018 1650   LABSPEC 1.004 (L) 12/24/2018 1650   PHURINE 7.0 12/24/2018 1650   GLUCOSEU NEGATIVE 12/24/2018 1650   HGBUR NEGATIVE 12/24/2018 1650   BILIRUBINUR NEGATIVE 12/24/2018 1650   KETONESUR NEGATIVE 12/24/2018 1650   PROTEINUR NEGATIVE 12/24/2018 1650   UROBILINOGEN 0.2 08/16/2008 2359   NITRITE NEGATIVE 12/24/2018 1650   LEUKOCYTESUR NEGATIVE 12/24/2018 1650   Sepsis Labs: @LABRCNTIP (procalcitonin:4,lacticidven:4) )No results found for this or any previous visit (from the past 240 hour(s)).   Scheduled Meds: . apixaban  5 mg Oral BID  . famotidine  20 mg Oral Daily  . furosemide  40 mg Intravenous BID  . mouth rinse  15 mL Mouth Rinse BID  . potassium chloride  20 mEq Oral BID   Continuous Infusions: . diltiazem (CARDIZEM) infusion 2.5 mg/hr (01/21/19 0500)    Procedures/Studies: Dg Chest 2  View  Result Date: 01/20/2019 CLINICAL DATA:  Recent pneumonia/bronchitis treated with antibiotics. History of atrial fibrillation. EXAM: CHEST - 2 VIEW COMPARISON:  Radiographs 12/24/2018 and 11/20/2018. FINDINGS: 1756 hours. Stable cardiomegaly and aortic atherosclerosis. There are small left-greater-than-right pleural effusions. There are diffuse interstitial opacities in both lungs which are similar to the most recent study, probably reflecting edema. There is no confluent airspace opacity or pneumothorax. The bones appear unchanged. Multiple telemetry leads overlie the chest. IMPRESSION: Overall, little change is seen from the most recent study 4 weeks ago. There is cardiomegaly  with diffuse interstitial opacities and left-greater-than-right pleural effusions, most consistent with congestive heart failure or atypical infection. No confluent airspace opacity. Electronically Signed   By: Richardean Sale M.D.   On: 01/20/2019 18:42   Dg Chest Portable 1 View  Result Date: 12/24/2018 CLINICAL DATA:  Patient with productive cough.  Shortness of breath. EXAM: PORTABLE CHEST 1 VIEW COMPARISON:  Chest radiograph 11/20/2018 FINDINGS: Monitoring leads overlie the patient. Stable cardiomegaly. Small left pleural effusion with underlying opacities. No pneumothorax. Thoracic spine degenerative changes. IMPRESSION: Small left pleural effusion with underlying opacities which may represent atelectasis or infection. Electronically Signed   By: Lovey Newcomer M.D.   On: 12/24/2018 11:14    Orson Eva, DO  [facility] Pager [phone]  If 7PM-7AM, please contact night-coverage [email or web address] Password [email or web address] 01/21/2019, 7:50 AM   LOS: 0 days

## 2019-01-21 NOTE — Progress Notes (Signed)
Pt transported via stretcher to [facility] unit 6E bed 9. Report called to Sam. Pt's medications obtained from pharmacy and sent in pt belonging bag. VSS. Cardizem 2.5 continue upon transfer.

## 2019-01-21 NOTE — Consult Note (Signed)
Cardiology Consultation:   Carol Hardin ID: LYNDI HOLBEIN MRN: [id number]; DOB: 1933-01-29  Admit date: 01/20/2019 Date of Consult: 01/21/2019  Primary Care Provider: Celene Squibb, MD Primary Cardiologist: Carlyle Dolly, MD  Primary Electrophysiologist:  Thompson Grayer, MD   Carol Hardin Profile:   Carol Hardin is a 85 y.o. female with a hx of chronic atrial fibrillation, tachybradycardia syndrome, chronic systolic heart failure with an EF 40 to 45%, moderate RV dysfunction, HTN, and anxiety  who is being seen today for the evaluation of Afib RVR and CHF exacerbation at the request of Dr. Marlon Pel.  History of Present Illness:   Carol Hardin is known to Carol service and last saw Dr. Harl Bowie during a telehealth visit on 01/12/2019.  Carol Hardin has chronic atrial fibrillation and has been noted to have significant bradycardia on low-dose beta-blockers.  Carol Hardin is compliant on Eliquis for anticoagulation.  Carol Hardin was admitted in March 2020 with A. fib RVR and CHF exacerbation.  Tikosyn initiation was recommended previously by EP, however the Carol Hardin did not wish to start Carol right away (avoiding hospitalization at Carol time).  During Carol Hardin March admission Carol Hardin was started on amiodarone given Carol Hardin marginal blood pressure and elevated heart rate.  Unfortunately, Carol Hardin has had significant side effects including abdominal symptoms, thought to be due to the amiodarone.  During Carol Hardin telehealth visit Carol Hardin was instructed to hold amiodarone for 3 days and then restart at 200 mg daily.  Carol Hardin was also treated for acute on chronic systolic heart failure during Carol Hardin March admission with a weight gain of 8 pounds.  Carol Hardin was stable on 20 mg of daily Lasix at home.  Dry weight is thought to be 137 pounds.  Through telephone calls documented in Epic, it sounds as though Carol Hardin continued to have GI issues, SOB, near sycope, and chest tightness. Dr. Harl Bowie attempted to manage these problems outpatient, but it sounds as though Carol Hardin symptoms were  worsening and Carol Hardin reported to [facility] ED. Carol Hardin was found to have significant SOB and RVR. Carol Hardin states Carol Hardin can no longer tolerate the amiodarone. Carol Hardin was started on cardizem drip in the ER and admitted to hospitalist service with plans to transfer to [facility].   On arrival to [facility], Carol Hardin remains on cardizem drip running at 2.5 mg/hr. Carol Hardin main complaint is diarrhea from the ABX Carol Hardin received PTA for possible bronchitis (z-pack). CXR with pleural effusions and cardiomegaly. Of note, Carol Hardin was hospitalized with PNA Feb 2020. Carol Hardin continues to report chest tightness around Carol Hardin lower rib cage. Carol Hardin denies lower extremity edema. Carol Hardin still reports chest tightness and related SOB.    Past Medical History:  Diagnosis Date  . Anxiety   . Arthritis    "fingers, right toe" (08/09/2016)  . Basal cell carcinoma of lower leg, right   . Bradycardia    a. Holter 08/30/16 showed profound bradycardia down to 30 during awake hours, several 2 second pauses, very frequent PVCs >8000 in 48 hours, and NSVT (longest of 14 beats).  . Cardiomyopathy ([place])    a. EF 40% by echo 08/2016.  . Daily headache    "I usually wake up w/a headache; sometimes it's a migraine" (08/09/2016)  . Depression   . Diverticulosis    Hx. of  . Frequent PVCs   . GERD (gastroesophageal reflux disease)   . Hypercholesterolemia   . Migraine   . Mitral regurgitation   . MVP (mitral valve prolapse)   . NSVT (nonsustained ventricular tachycardia) ([place])    a.  first noted event monitor 08/2016.  . Osteoporosis   . PAF (paroxysmal atrial fibrillation) ([place])    a. in afib at time of echo 08/2016.  Marland Kitchen Pneumonia   . Scoliosis    mild  . Squamous cell carcinoma of neck     Past Surgical History:  Procedure Laterality Date  . BASAL CELL CARCINOMA EXCISION Right    RLE  . BLEPHAROPLASTY    . BREAST BIOPSY Left   . BREAST CYST ASPIRATION Left   . BREAST CYST EXCISION Left   . BUNIONECTOMY Bilateral   . DILATION AND CURETTAGE OF UTERUS    .  EXCISIONAL HEMORRHOIDECTOMY    . INTRAMEDULLARY (IM) NAIL INTERTROCHANTERIC Left 12/31/2017   Procedure: INTRAMEDULLARY (IM) NAIL INTERTROCHANTRIC;  Surgeon: Nicholes Stairs, MD;  Location: [place];  Service: Orthopedics;  Laterality: Left;  . KNEE ARTHROSCOPY Right 04/12/2007  . KNEE ARTHROSCOPY Left   . SQUAMOUS CELL CARCINOMA EXCISION     "neck"  . TONSILLECTOMY       Home Medications:  Prior to Admission medications   Medication Sig Start Date End Date Taking? Authorizing Provider  acetaminophen (TYLENOL) 325 MG tablet Take 2 tablets (650 mg total) by mouth every 4 (four) hours as needed for headache or mild pain. 12/29/18  Yes Emokpae, Courage, MD  amiodarone (PACERONE) 200 MG tablet Take 1 tablet (200 mg total) by mouth daily. Carol Hardin taking differently: Take 200 mg by mouth 2 (two) times daily.  01/10/19  Yes Branch, Alphonse Guild, MD  apixaban (ELIQUIS) 5 MG TABS tablet Take 1 tablet (5 mg total) by mouth 2 (two) times daily. 11/03/18  Yes Allred, Jeneen Rinks, MD  busPIRone (BUSPAR) 5 MG tablet Take 5 mg by mouth 3 (three) times daily as needed (anxiety).   Yes [provider]  diltiazem (CARDIZEM) 30 MG tablet Take 30 mg by mouth every 12 (twelve) hours as needed.   Yes [provider]  Emollient ([place] HAND & BODY) LOTN Apply 1 application topically daily as needed.   Yes [provider]  famotidine (PEPCID) 20 MG tablet Take 20 mg by mouth daily.   Yes [provider]  furosemide (LASIX) 20 MG tablet Take 1 tablet (20 mg total) by mouth every Tuesday, Thursday, Saturday, and Sunday. 12/30/18  Yes Emokpae, Courage, MD  guaiFENesin (MUCINEX) 600 MG 12 hr tablet Take 600 mg by mouth as needed for cough. Takes PRN for congested cough. Purchased OTC. Has been taking daily around lunch.   Yes [provider]  LORazepam (ATIVAN) 1 MG tablet Take 1 mg by mouth at bedtime.   Yes [provider]  polyethylene glycol (MIRALAX / GLYCOLAX) packet  Take 17 g by mouth daily as needed for mild constipation.    Yes [provider]    Inpatient Medications: Scheduled Meds: . apixaban  5 mg Oral BID  . famotidine  20 mg Oral Daily  . mouth rinse  15 mL Mouth Rinse BID  . potassium chloride  20 mEq Oral BID   Continuous Infusions: . diltiazem (CARDIZEM) infusion 2.5 mg/hr (01/21/19 0500)   PRN Meds: acetaminophen, busPIRone, ondansetron (ZOFRAN) IV, polyethylene glycol  Allergies:    Allergies  Allergen Reactions  . Omeprazole Other (See Comments)    siezure  . Flagyl [Metronidazole] Nausea And Vomiting  . Aripiprazole Other (See Comments)    Unknown reaction  . Hylan G-F 20 Other (See Comments)    Unknown reaction  . Paroxetine Hcl Other (See Comments)  Headaches (a long time ago- pt doesn't really remember)  . Statins Other (See Comments)    No specific reaction given-Carol Hardin states that Carol Hardin was advised not to take by physician  . Sulfa Antibiotics Diarrhea and Other (See Comments)    Colitis   . Toprol Xl [Metoprolol Succinate] Other (See Comments)    Dizziness--pt doesn't really remember   . Vancomycin Itching and Other (See Comments)    Pt reports that they gave it too fast- Carol Hardin started having itching in the scalp  . Penicillins Rash    DID THE REACTION INVOLVE: Swelling of the face/tongue/throat, SOB, or low BP? No Sudden or severe rash/hives, skin peeling, or the inside of the mouth or nose? Yes Did it require medical treatment? Unknown When did it last happen?Over 10 years If all above answers are "NO", may proceed with cephalosporin use.     Social History:   Social History   Socioeconomic History  . Marital status: Married    Spouse name: Not on file  . Number of children: Not on file  . Years of education: Not on file  . Highest education level: Not on file  Occupational History  . Not on file  Social Needs  . Financial resource strain: Somewhat hard  . Food insecurity:    Worry:  Never true    Inability: Never true  . Transportation needs:    Medical: No    Non-medical: No  Tobacco Use  . Smoking status: Former Smoker    Packs/day: 1.00    Years: 20.00    Pack years: 20.00    Types: Cigarettes    Last attempt to quit: 1989    Years since quitting: 31.2  . Smokeless tobacco: Never Used  Substance and Sexual Activity  . Alcohol use: No  . Drug use: No  . Sexual activity: Not Currently  Lifestyle  . Physical activity:    Days per week: 0 days    Minutes per session: 0 min  . Stress: Rather much  Relationships  . Social connections:    Talks on phone: Once a week    Gets together: Once a week    Attends religious service: Never    Active member of club or organization: No    Attends meetings of clubs or organizations: Never    Relationship status: Married  . Intimate partner violence:    Fear of current or ex partner: No    Emotionally abused: No    Physically abused: No    Forced sexual activity: No  Other Topics Concern  . Not on file  Social History Narrative  . Not on file    Family History:    Family History  Problem Relation Age of Onset  . Heart failure Mother   . Leukemia Mother   . Heart failure Father      ROS:  Please see the history of present illness.   All other ROS reviewed and negative.     Physical Exam/Data:   Vitals:   01/21/19 0708 01/21/19 0800 01/21/19 0900 01/21/19 1000  BP:  (!) 114/98 100/78 101/67  Pulse: (!) 35 (!) 50 70 94  Resp: 16 20 (!) 27 19  Temp: 98.3 F (36.8 C)     TempSrc: Oral     SpO2: 93% 96% 95% 96%  Weight:      Height:        Intake/Output Summary (Last 24 hours) at 01/21/2019 1221 Last data filed at 01/21/2019 1000  Gross per 24 hour  Intake 43.75 ml  Output 2400 ml  Net -2356.25 ml   Last 3 Weights 01/20/2019 01/20/2019 01/12/2019  Weight (lbs) 138 lb 0.1 oz 140 lb 140 lb  Weight (kg) 62.6 kg 63.504 kg 63.504 kg  Some encounter information is confidential and restricted. Go to  Review Flowsheets activity to see all data.     Body mass index is 22.97 kg/m.  General:  Well nourished, well developed, in no acute distress HEENT: normal Lymph: no adenopathy Neck: no JVD Endocrine:  No thryomegaly Vascular: No carotid bruits; FA pulses 2+ bilaterally without bruits  Cardiac: Tachycardic, irregular; no murmur  Lungs:  clear to auscultation bilaterally, no wheezing, rhonchi or rales  Abd: soft, nontender, no hepatomegaly  Ext: no edema Musculoskeletal:  No deformities, BUE and BLE strength normal and equal Skin: warm and dry  Neuro:  CNs 2-12 intact, no focal abnormalities noted Psych:  Normal affect   EKG:  The EKG was personally reviewed and demonstrates: Atrial fibrillation, left bundle branch block Telemetry:  Telemetry was personally reviewed and demonstrates: Atrial fibrillation  Relevant CV Studies:  Echo 11/19/18:  1. The left ventricle has mild-moderately reduced systolic function of 80-99%. The cavity size was normal. There is concentric left ventricular hypertrophy. Left ventricular diastology could not be evaluated secondary to atrial fibrillation. Elevated  left ventricular end-diastolic pressure Left ventricular diffuse hypokinesis.  2. The right ventricle has moderately reduced systolic function. The cavity was mildly enlarged. There is no increase in right ventricular wall thickness.  3. Left atrial size was mildly dilated.  4. Right atrial size was moderately dilated.  5. Moderate pleural effusion in the left lateral region.  6. The mitral valve is myxomatous. There is mild thickening. Mitral valve regurgitation is moderate by color flow Doppler.  7. The tricuspid valve is normal in structure.  8. The aortic valve is tricuspid Aortic valve regurgitation is mild by color flow Doppler.  9. There is dilatation of the aortic root. 10. The inferior vena cava was dilated in size with >50% respiratory variability.  Laboratory Data:  Chemistry Recent  Labs  Lab 01/20/19 1637 01/21/19 0409  NA 138 141  K 3.6 3.4*  CL 103 104  CO2 22 26  GLUCOSE 132* 83  BUN 22 21  CREATININE 1.31* 1.30*  CALCIUM 9.5 8.6*  GFRNONAA 38* 38*  GFRAA 44* 44*  ANIONGAP 13 11    Recent Labs  Lab 01/20/19 1637 01/21/19 0409  PROT 8.0 5.7*  ALBUMIN 4.7 3.4*  AST 56* 34  ALT 47* 33  ALKPHOS 66 46  BILITOT 2.5* 1.8*   Hematology Recent Labs  Lab 01/20/19 1637  WBC 7.5  RBC 4.81  HGB 15.4*  HCT 47.5*  MCV 98.8  MCH 32.0  MCHC 32.4  RDW 14.7  PLT 203   Cardiac Enzymes Recent Labs  Lab 01/20/19 1637  TROPONINI <0.03   No results for input(s): TROPIPOC in the last 168 hours.  BNP Recent Labs  Lab 01/20/19 1637  BNP 1,638.0*    DDimer No results for input(s): DDIMER in the last 168 hours.  Radiology/Studies:  Dg Chest 2 View  Result Date: 01/20/2019 CLINICAL DATA:  Recent pneumonia/bronchitis treated with antibiotics. History of atrial fibrillation. EXAM: CHEST - 2 VIEW COMPARISON:  Radiographs 12/24/2018 and 11/20/2018. FINDINGS: 1756 hours. Stable cardiomegaly and aortic atherosclerosis. There are small left-greater-than-right pleural effusions. There are diffuse interstitial opacities in both lungs which are similar to the most recent study,  probably reflecting edema. There is no confluent airspace opacity or pneumothorax. The bones appear unchanged. Multiple telemetry leads overlie the chest. IMPRESSION: Overall, little change is seen from the most recent study 4 weeks ago. There is cardiomegaly with diffuse interstitial opacities and left-greater-than-right pleural effusions, most consistent with congestive heart failure or atypical infection. No confluent airspace opacity. Electronically Signed   By: Richardean Sale M.D.   On: 01/20/2019 18:42    Assessment and Plan:   1. Chronic atrial fibrillation, now with RVR 2. Tachy-brady syndrome - no AV nodal agents given hx of significant bradycardia on these medications - previously  felt to be a tikosyn candidate, but had not yet initiated with COVID-19 restrictions - started on amiodarone 12/2018 admission, but Carol Hardin is unable to tolerate Carol medication and stopped taking it - telemetry with HR here in the 70-140s with marginal pressures - anticoagulated with eliquis and has been compliant - K is 3.4 - replenish - keep K > 4, Mg > 2 (Mg is pending) - cardizem drip running at 2.5 mg/hr   3. Acute on chronic systolic heart failure, EF  - dry weight is 137 lbs - weight on transfer was 135 lbs - likely exacerbated by Carol Hardin RVR - echo 11/19/18 with EF of 40-45% - given Carol Hardin SOB, may benefit from a limited echo to evaluate EF once HR is better controlled - pressure is marginal - Jhony Antrim proceed conservatively with diuretic until heart rate is better controlled and pressures have recovered - although Carol Hardin has a history of marginal pressure - unclear if heart failure is driving RVR or if Carol Hardin tachycardic rate is precipitating congestive heart failure exacerbation   4. Acute kidney insufficiency - unclear chronicity - likely due to Afib RVR and CHF exacerbation - gentle diuresis and rate control - BMP daily  - keep K > 4 - baseline creatinine appears to be 1.07-1.3      For questions or updates, please contact [place] Please consult [email or web address] for contact info under     Signed, Ledora Bottcher, PA  01/21/2019 12:21 PM  I have seen and examined Carol Hardin with [place].  Agree with above, note added to reflect my findings.  On exam, irregular, tachycardic, no murmurs, lungs clear.  Carol Hardin mid to the hospital with what appears to be heart failure exacerbation.  Carol Hardin is in atrial fibrillation with rapid rates as well as increased pulmonary vascular congestion on Carol Hardin chest x-ray.  It is certainly possible that Carol Hardin heart failure exacerbation is due to Carol Hardin rapid atrial fibrillation.  Carol Hardin has had significant bradycardia in the past with 2 to 3-second pauses, but I do  not see any evidence recently of sinus rhythm; appears that Carol Hardin has been in atrial fibrillation for quite some time.  I discussed with Carol Hardin options of restarting amiodarone versus dofetilide loading.  QTC is certainly prolonged and we would have to load with a lower dose likely.  At Carol point, Carol Hardin is quite concerned about the side effects of dofetilide and amiodarone and wishes to have a rate control strategy.  Due to that, I Gustie Bobb start Carol Hardin on Bystolic 5 mg today.  If Carol Hardin heart rate is better controlled, Carol Hardin may be able to come off of the diltiazem drip.  If not, Carol Hardin would potentially need an AV nodal ablation and pacemaker implant.  Vartan Kerins M. Nuala Chiles MD 01/21/2019 1:40 PM

## 2019-01-22 DIAGNOSIS — J9611 Chronic respiratory failure with hypoxia: Secondary | ICD-10-CM | POA: Diagnosis not present

## 2019-01-22 DIAGNOSIS — Z79899 Other long term (current) drug therapy: Secondary | ICD-10-CM | POA: Diagnosis not present

## 2019-01-22 DIAGNOSIS — I959 Hypotension, unspecified: Secondary | ICD-10-CM | POA: Diagnosis present

## 2019-01-22 DIAGNOSIS — I5043 Acute on chronic combined systolic (congestive) and diastolic (congestive) heart failure: Secondary | ICD-10-CM

## 2019-01-22 DIAGNOSIS — Z959 Presence of cardiac and vascular implant and graft, unspecified: Secondary | ICD-10-CM | POA: Diagnosis not present

## 2019-01-22 DIAGNOSIS — N184 Chronic kidney disease, stage 4 (severe): Secondary | ICD-10-CM

## 2019-01-22 DIAGNOSIS — M810 Age-related osteoporosis without current pathological fracture: Secondary | ICD-10-CM | POA: Diagnosis present

## 2019-01-22 DIAGNOSIS — E785 Hyperlipidemia, unspecified: Secondary | ICD-10-CM | POA: Diagnosis present

## 2019-01-22 DIAGNOSIS — F411 Generalized anxiety disorder: Secondary | ICD-10-CM | POA: Diagnosis present

## 2019-01-22 DIAGNOSIS — I495 Sick sinus syndrome: Secondary | ICD-10-CM | POA: Diagnosis not present

## 2019-01-22 DIAGNOSIS — Z7901 Long term (current) use of anticoagulants: Secondary | ICD-10-CM | POA: Diagnosis not present

## 2019-01-22 DIAGNOSIS — I4819 Other persistent atrial fibrillation: Secondary | ICD-10-CM | POA: Diagnosis not present

## 2019-01-22 DIAGNOSIS — J9811 Atelectasis: Secondary | ICD-10-CM | POA: Diagnosis not present

## 2019-01-22 DIAGNOSIS — N171 Acute kidney failure with acute cortical necrosis: Secondary | ICD-10-CM | POA: Diagnosis not present

## 2019-01-22 DIAGNOSIS — K761 Chronic passive congestion of liver: Secondary | ICD-10-CM | POA: Diagnosis present

## 2019-01-22 DIAGNOSIS — J90 Pleural effusion, not elsewhere classified: Secondary | ICD-10-CM | POA: Diagnosis not present

## 2019-01-22 DIAGNOSIS — Z87891 Personal history of nicotine dependence: Secondary | ICD-10-CM | POA: Diagnosis not present

## 2019-01-22 DIAGNOSIS — K219 Gastro-esophageal reflux disease without esophagitis: Secondary | ICD-10-CM | POA: Diagnosis present

## 2019-01-22 DIAGNOSIS — N179 Acute kidney failure, unspecified: Secondary | ICD-10-CM | POA: Diagnosis not present

## 2019-01-22 DIAGNOSIS — E876 Hypokalemia: Secondary | ICD-10-CM | POA: Diagnosis not present

## 2019-01-22 DIAGNOSIS — E7800 Pure hypercholesterolemia, unspecified: Secondary | ICD-10-CM | POA: Diagnosis present

## 2019-01-22 DIAGNOSIS — I428 Other cardiomyopathies: Secondary | ICD-10-CM | POA: Diagnosis present

## 2019-01-22 DIAGNOSIS — Z8589 Personal history of malignant neoplasm of other organs and systems: Secondary | ICD-10-CM | POA: Diagnosis not present

## 2019-01-22 DIAGNOSIS — Z85828 Personal history of other malignant neoplasm of skin: Secondary | ICD-10-CM | POA: Diagnosis not present

## 2019-01-22 DIAGNOSIS — I130 Hypertensive heart and chronic kidney disease with heart failure and stage 1 through stage 4 chronic kidney disease, or unspecified chronic kidney disease: Secondary | ICD-10-CM | POA: Diagnosis not present

## 2019-01-22 DIAGNOSIS — I4891 Unspecified atrial fibrillation: Secondary | ICD-10-CM | POA: Diagnosis not present

## 2019-01-22 DIAGNOSIS — I5023 Acute on chronic systolic (congestive) heart failure: Secondary | ICD-10-CM | POA: Diagnosis not present

## 2019-01-22 DIAGNOSIS — I447 Left bundle-branch block, unspecified: Secondary | ICD-10-CM | POA: Diagnosis present

## 2019-01-22 DIAGNOSIS — I10 Essential (primary) hypertension: Secondary | ICD-10-CM | POA: Diagnosis not present

## 2019-01-22 DIAGNOSIS — N183 Chronic kidney disease, stage 3 (moderate): Secondary | ICD-10-CM | POA: Diagnosis not present

## 2019-01-22 DIAGNOSIS — I5022 Chronic systolic (congestive) heart failure: Secondary | ICD-10-CM | POA: Diagnosis not present

## 2019-01-22 DIAGNOSIS — I482 Chronic atrial fibrillation, unspecified: Secondary | ICD-10-CM | POA: Diagnosis not present

## 2019-01-22 LAB — COMPREHENSIVE METABOLIC PANEL
ALT: 31 U/L (ref 0–44)
AST: 32 U/L (ref 15–41)
Albumin: 3.3 g/dL — ABNORMAL LOW (ref 3.5–5.0)
Alkaline Phosphatase: 51 U/L (ref 38–126)
Anion gap: 12 (ref 5–15)
BUN: 17 mg/dL (ref 8–23)
CO2: 26 mmol/L (ref 22–32)
Calcium: 8.7 mg/dL — ABNORMAL LOW (ref 8.9–10.3)
Chloride: 102 mmol/L (ref 98–111)
Creatinine, Ser: 1.6 mg/dL — ABNORMAL HIGH (ref 0.44–1.00)
GFR calc Af Amer: 34 mL/min — ABNORMAL LOW (ref >60)
GFR calc non Af Amer: 29 mL/min — ABNORMAL LOW (ref >60)
Glucose, Bld: 96 mg/dL (ref 70–99)
Potassium: 3.2 mmol/L — ABNORMAL LOW (ref 3.5–5.1)
Sodium: 140 mmol/L (ref 135–145)
Total Bilirubin: 1.6 mg/dL — ABNORMAL HIGH (ref 0.3–1.2)
Total Protein: 5.8 g/dL — ABNORMAL LOW (ref 6.5–8.1)

## 2019-01-22 LAB — SURGICAL PCR SCREEN
MRSA, PCR: NEGATIVE
Staphylococcus aureus: NEGATIVE

## 2019-01-22 LAB — MAGNESIUM: Magnesium: 1.8 mg/dL (ref 1.7–2.4)

## 2019-01-22 LAB — HAPTOGLOBIN: Haptoglobin: 92 mg/dL (ref 41–333)

## 2019-01-22 MED ORDER — VANCOMYCIN HCL 1000 MG IV SOLR
1000.0000 mg | INTRAVENOUS | Status: AC
  Administered 2019-01-23: 1000 mg via INTRAVENOUS
  Filled 2019-01-22: qty 1000

## 2019-01-22 MED ORDER — NEBIVOLOL HCL 5 MG PO TABS: 7.5000 mg | ORAL_TABLET | Freq: Every day | ORAL | Status: DC

## 2019-01-22 MED ORDER — LORAZEPAM 1 MG PO TABS
1.0000 mg | ORAL_TABLET | Freq: Every day | ORAL | Status: DC
  Administered 2019-01-22 – 2019-01-23 (×2): 1 mg via ORAL
  Filled 2019-01-22 (×3): qty 1

## 2019-01-22 MED ORDER — NEBIVOLOL HCL 5 MG PO TABS
2.5000 mg | ORAL_TABLET | Freq: Once | ORAL | Status: AC
  Administered 2019-01-22: 2.5 mg via ORAL
  Filled 2019-01-22: qty 1

## 2019-01-22 MED ORDER — MAGNESIUM SULFATE IN D5W 1-5 GM/100ML-% IV SOLN
1.0000 g | Freq: Once | INTRAVENOUS | Status: AC
  Administered 2019-01-22: 1 g via INTRAVENOUS
  Filled 2019-01-22: qty 100

## 2019-01-22 MED ORDER — POTASSIUM CHLORIDE CRYS ER 20 MEQ PO TBCR: 40.0000 meq | EXTENDED_RELEASE_TABLET | Freq: Two times a day (BID) | ORAL | Status: DC

## 2019-01-22 MED ORDER — NEBIVOLOL HCL 5 MG PO TABS
5.0000 mg | ORAL_TABLET | Freq: Once | ORAL | Status: AC
  Administered 2019-01-22: 5 mg via ORAL
  Filled 2019-01-22: qty 1

## 2019-01-22 MED ORDER — SODIUM CHLORIDE 0.45 % IV SOLN
INTRAVENOUS | Status: DC
  Administered 2019-01-23: 06:00:00 via INTRAVENOUS

## 2019-01-22 MED ORDER — SODIUM CHLORIDE 0.9 % IV SOLN
80.0000 mg | INTRAVENOUS | Status: AC
  Administered 2019-01-23: 80 mg
  Filled 2019-01-22: qty 2

## 2019-01-22 MED ORDER — POTASSIUM CHLORIDE CRYS ER 20 MEQ PO TBCR
40.0000 meq | EXTENDED_RELEASE_TABLET | Freq: Two times a day (BID) | ORAL | Status: AC
  Administered 2019-01-22 (×2): 40 meq via ORAL
  Filled 2019-01-22 (×2): qty 2

## 2019-01-22 MED ORDER — SODIUM CHLORIDE 0.9 % IV SOLN
INTRAVENOUS | Status: DC
  Administered 2019-01-23: 06:00:00 via INTRAVENOUS

## 2019-01-22 NOTE — Plan of Care (Signed)

## 2019-01-22 NOTE — Progress Notes (Signed)
Progress Note  Patient Name: Carol Hardin Date of Encounter: 01/22/2019  Primary Cardiologist: Carlyle Dolly, MD   Subjective   Continued SOB. Had AF rates in the 120s with increasing SOB. Diuresed well yesterday with increase in creatinine.  Inpatient Medications    Scheduled Meds:  apixaban  5 mg Oral BID   famotidine  20 mg Oral Daily   mouth rinse  15 mL Mouth Rinse BID   [START ON 01/23/2019] nebivolol  7.5 mg Oral Daily   potassium chloride  40 mEq Oral BID   Continuous Infusions:  diltiazem (CARDIZEM) infusion Stopped (01/22/19 1005)   PRN Meds: acetaminophen, busPIRone, ondansetron (ZOFRAN) IV, polyethylene glycol   Vital Signs    Vitals:   01/22/19 1004 01/22/19 1012 01/22/19 1041 01/22/19 1152  BP: (!) 83/70 101/65 97/83 (!) 114/54  Pulse: (!) 45 (!) 57 (!) 49 77  Resp: 18 16 18  (!) 23  Temp:   97.9 F (36.6 C) 97.7 F (36.5 C)  TempSrc:    Oral  SpO2: 94% 96% 96% 92%  Weight:      Height:        Intake/Output Summary (Last 24 hours) at 01/22/2019 1200 Last data filed at 01/22/2019 0900 Gross per 24 hour  Intake 564 ml  Output 1450 ml  Net -886 ml   Filed Weights   01/20/19 2215 01/21/19 1226 01/22/19 0656  Weight: 62.6 kg 61.3 kg 60.2 kg   Telemetry    AF, rate 100-120s - Personally Reviewed  ECG    None new. - Personally Reviewed  Physical Exam   GEN: Well nourished, well developed, in no acute distress  HEENT: normal  Neck: no JVD, carotid bruits, or masses Cardiac: irregular, tachycardic, no murmurs, rubs, or gallops,no edema  Respiratory:  clear to auscultation bilaterally, normal work of breathing GI: soft, nontender, nondistended, + BS MS: no deformity or atrophy  Skin: warm and dry Neuro:  Strength and sensation are intact Psych: euthymic mood, full affect   Labs    Chemistry Recent Labs  Lab 01/20/19 1637 01/21/19 0409 01/22/19 0419  NA 138 141 140  K 3.6 3.4* 3.2*  CL 103 104 102  CO2 22 26 26    GLUCOSE 132* 83 96  BUN 22 21 17   CREATININE 1.31* 1.30* 1.60*  CALCIUM 9.5 8.6* 8.7*  PROT 8.0 5.7* 5.8*  ALBUMIN 4.7 3.4* 3.3*  AST 56* 34 32  ALT 47* 33 31  ALKPHOS 66 46 51  BILITOT 2.5* 1.8* 1.6*  GFRNONAA 38* 38* 29*  GFRAA 44* 44* 34*  ANIONGAP 13 11 12     Hematology Recent Labs  Lab 01/20/19 1637  WBC 7.5  RBC 4.81  HGB 15.4*  HCT 47.5*  MCV 98.8  MCH 32.0  MCHC 32.4  RDW 14.7  PLT 203   Cardiac Enzymes Recent Labs  Lab 01/20/19 1637  TROPONINI <0.03   No results for input(s): TROPIPOC in the last 168 hours.   BNP Recent Labs  Lab 01/20/19 1637  BNP 1,638.0*    DDimer No results for input(s): DDIMER in the last 168 hours.   Radiology    Dg Chest 2 View  Result Date: 01/20/2019 CLINICAL DATA:  Recent pneumonia/bronchitis treated with antibiotics. History of atrial fibrillation. EXAM: CHEST - 2 VIEW COMPARISON:  Radiographs 12/24/2018 and 11/20/2018. FINDINGS: 1756 hours. Stable cardiomegaly and aortic atherosclerosis. There are small left-greater-than-right pleural effusions. There are diffuse interstitial opacities in both lungs which are similar to the most recent  study, probably reflecting edema. There is no confluent airspace opacity or pneumothorax. The bones appear unchanged. Multiple telemetry leads overlie the chest. IMPRESSION: Overall, little change is seen from the most recent study 4 weeks ago. There is cardiomegaly with diffuse interstitial opacities and left-greater-than-right pleural effusions, most consistent with congestive heart failure or atypical infection. No confluent airspace opacity. Electronically Signed   By: Richardean Sale M.D.   On: 01/20/2019 18:42   Cardiac Studies   Echocardiogram 11/19/2018:  1. The left ventricle has mild-moderately reduced systolic function of 48-18%. The cavity size was normal. There is concentric left ventricular hypertrophy. Left ventricular diastology could not be evaluated secondary to atrial  fibrillation. Elevated  left ventricular end-diastolic pressure Left ventricular diffuse hypokinesis.  2. The right ventricle has moderately reduced systolic function. The cavity was mildly enlarged. There is no increase in right ventricular wall thickness.  3. Left atrial size was mildly dilated.  4. Right atrial size was moderately dilated.  5. Moderate pleural effusion in the left lateral region.  6. The mitral valve is myxomatous. There is mild thickening. Mitral valve regurgitation is moderate by color flow Doppler.  7. The tricuspid valve is normal in structure.  8. The aortic valve is tricuspid Aortic valve regurgitation is mild by color flow Doppler.  9. There is dilatation of the aortic root. 10. The inferior vena cava was dilated in size with >50% respiratory variability.    Patient Profile   Carol Hardin is a 85 y.o. female with a history of persistent AF, tachy/brady syndrome, CHF, anxiety presenting with AF with RVR and SOB.  Assessment & Plan    Chronic Atrial Fibrillation and Tachy-Brady Syndrome Difficult situation. The patient does not wish to be on tikosyn or amiodarone. Carol Hardin thus plan for a rate control strategy. I am worried that her rate control Carol Hardin be difficult as we are limited by BP. Carol Hardin increase bisoprolol today but if her HR is not controlled, she may require pacemaker and AVN ablation.  Acute on Chronic Systolic CHF EF 59-09%. Has been diuresed with increased creatinine but still SOB. Possibly due to rapid AF. Carol Hardin hold diuresis and work on rate control.  AKI Creatinine increased from yesterday. Carol Hardin plan to hold lasix.  For questions or updates, please contact [place] Please consult [email or web address] for contact info under Cardiology/STEMI.     Signed, Carol Perleberg Meredith Leeds, MD 01/22/2019, 12:00 PM

## 2019-01-22 NOTE — Patient Outreach (Signed)
[place] [facility]) Care Management    HALAYNA BLANE [birth year] [phone]    Mrs. Pafford left a voice message after hours on yesterday.  Successful outreach with her today. States she received home oxygen delivery from [place] but would like additional instructions regarding extension tubing and connectors. Prefers demonstration in the home vice telephonic instruction. Also requesting additional nasal cannula tubing.  Call placed to [facility]. Staff agreed to return later this week to review instructions and provide additional tubing. Informed Mrs. Prestia of pending outreach.  PLAN Will follow up next week.   [place] [phone]

## 2019-01-22 NOTE — Progress Notes (Signed)
Pt HR increased to 150 while ambulating. HR 126 at rest and BP 103/87. Cardiology Sarajane Jews, NP notified.

## 2019-01-22 NOTE — Progress Notes (Signed)
BP 83/70, HR 98. Stopped Diltiazem gtt per Sarajane Jews, [place] verbal order due to low BP. Will continue to monitor.

## 2019-01-22 NOTE — Progress Notes (Signed)
   Received page from RN that heart rates sustaining in the 140's to 150's. BP stable at 107/79. Patient received Bystolic 5mg  at 1329. Per Dr. Curt Bears note, today's plan was to increase Bystlic today so will have RN give additional 2.5mg  of Bystolic now and continue to monitor. Patient is tentatively scheduled for AV nodal ablation with biventricular pacemaker implantation tomorrow morning.  Darreld Mclean, PA-C 01/22/2019 2:38 PM

## 2019-01-22 NOTE — Progress Notes (Signed)
Paged Cardiology Sarajane Jews, NP) concerning HR in 130's -140's. NP ordered to administer 5mg  nebivolol and continue to monitor.

## 2019-01-22 NOTE — Progress Notes (Signed)
Pt discussed at length with the Dr Curt Bears today.  She is reluctant to consider AADs going forward. Further rate control is limited by sinus bradycardia as well as hypotension. EF has declined to 40%  I spoke with patient by phone today.  The patient has symptomatic tachycardia/ bradycardia syndrome.  I would therefore recommend biventricular pacemaker implantation with AV nodal ablation at this time.  Risks, benefits, alternatives to this procedure were discussed in detail with the patient today. The patient understands that the risks include but are not limited to bleeding, infection, pneumothorax, perforation, tamponade, vascular damage, renal failure, MI, stroke, death,  and lead dislodgement and wishes to proceed. She is aware that she will become device dependant.   We will therefore schedule the procedure at the next available time.  She has tentatively been placed on the schedule for tomorrow am.  Hold eliquis tomorrow am. NPO after midnight tonight.  Thompson Grayer MD, [facility] 01/22/2019 1:28 PM

## 2019-01-22 NOTE — Progress Notes (Signed)
PROGRESS NOTE    Carol Hardin  TOI:[id number] DOB: 1933/02/20 DOA: 01/20/2019 PCP: Celene Squibb, MD   Brief Narrative:  85-year WF PMHx, depression, generalized anxiety DO, cardiomyopathy (EF 40%) tachybradycardia syndrome,Chronic atrial fibrillation,  NSVT, MVP HLD, CKD stage III  Presenting with 3-4 day history of nonproductive cough and shortness of breath.  The patient denied any fevers, chills, hemoptysis, recent travels or sick contacts.  She has noted increasing lower extremity edema.  She has been compliant with social distancing measures in the context of the coronavirus pandemic and she denies any COVID positive or other sick contacts.  She had a recent hospital admit from 12/24/18 to 12/29/18 during which she was treated from acute on chronic systolic CHF and exacerbation of her tachy-brady syndrome.  She was started on an amiodarone drip and ultimately discharged on amiodarone 200 mg bid.  Her discharge weight was 137 lbs.  It has been documented by cardiology and EP that the patient likely needs to be initiated on Tikosyn.  Because of a variety of factors (pt delay in decision making, delay due to dental work, and now the current COVID-19 pandemic), Tikosyn initiation has been delayed. The patient's amiodarone was decreased to 200 mg once daily on 01/10/19 due to possible side effects (nausea, heartburn, regurgitation after eating, abdominal discomfort).   Patient had a virtual visit with Dr. Harl Bowie on 01/12/19.  She was instructed to hold amio x 3 days and restart lasix 20 mg po daily.  Since decreasing amio to once daily, she has noted increasing sob and LE edema.   She had another recent admission from 11/18/2018 through 11/22/2018 during which she was treated for pneumonia and tachybradycardia syndrome.  Her discharge weight was 138.2 pounds.    In the ED, the patient was afebrile and hemodynamically stable with heart rate 140s.  The patient was started on diltiazem drip with improvement  of her heart rate.  She was admitted to the stepdown unit for further treatment and evaluation. BNP was noted to be 1638.  Chest x-ray bilateral increased interstitial markings.  The patient was initiated on IV furosemide.    Subjective: 4/13/O x4, negative S OB, negative CP, negative abdominal pain.  Very apprehensive about being discharged without having a pacer installed (believes she will die).   Assessment & Plan:   Principal Problem:   Atrial fibrillation with RVR (HCC) Active Problems:   Generalized anxiety disorder   Essential hypertension   Hypokalemia   GERD (gastroesophageal reflux disease)   Atrial fibrillation, chronic   Acute on chronic systolic CHF (congestive heart failure) (HCC)   Tachy-brady syndrome (HCC)   CKD (chronic kidney disease) stage 3, GFR 30-59 ml/min (HCC)   Acute on chronic systolic CHF - Patient appears euvolemic - 3/20 discharge weight 137.3 pounds (62.2 kg) - 11/19/2018 echocardiogram:-EF 40- 55%, diffuse hypokinesis, moderate decreased RV function, moderate MR with mild myxomatous -Bystolic 7.5 milligrams daily -Strict in and out -Daily weight -  Tachybradycardia syndrome (sick sinus syndrome) with atrial fibrillation (chronic) --Discontinue bisoprolol due to hypotension on last admission - Eliquis 5 mg BID - Diltiazem drip - Patient may have pacer placed awaiting final decision by cardiology.   Generalized anxiety disorder -Patient extremely anxious that she obtained pacer prior to discharge (believe she will die as she does not obtain)  -Ativan 1 mg nightly -Buspirone 5 mg  TID   CKD stage III (baseline Cr 115- mid 120) -Monitor creatinine closely   Hypokalemia - Potassium goal>4 -  Potassium 80 mEq  Hypomagnesmia - Magnesium goal> 2 - Magnesium IV 1 g  Hyperbilirubinemia -mostly indirect -doubt hemolysis -Negative sign or symptom of hemolysis.   DVT prophylaxis: Eliquis Code Status: Full Family Communication: None  Disposition Plan: Per cardiology   Consultants:  Cardiology     Procedures/Significant Events:     I have personally reviewed and interpreted all radiology studies and my findings are as above.  VENTILATOR SETTINGS:    Cultures   Antimicrobials:    Devices    LINES / TUBES:      Continuous Infusions: . diltiazem (CARDIZEM) infusion 2.5 mg/hr (01/21/19 1613)     Objective: Vitals:   01/21/19 1957 01/21/19 2014 01/22/19 0656 01/22/19 0740  BP: 117/83  90/74 97/75  Pulse:  (!) 105 (!) 55 (!) 102  Resp:  (!) 24 19 (!) 26  Temp:   98 F (36.7 C) 97.9 F (36.6 C)  TempSrc:   Oral Oral  SpO2:  95% (!) 84% 96%  Weight:   60.2 kg   Height:        Intake/Output Summary (Last 24 hours) at 01/22/2019 0746 Last data filed at 01/22/2019 0500 Gross per 24 hour  Intake 342 ml  Output 2050 ml  Net -1708 ml   Filed Weights   01/20/19 2215 01/21/19 1226 01/22/19 0656  Weight: 62.6 kg 61.3 kg 60.2 kg    Examination:  General: A/O x4 no acute respiratory distress Eyes: negative scleral hemorrhage, negative anisocoria, negative icterus ENT: Negative Runny nose, negative gingival bleeding, Neck:  Negative scars, masses, torticollis, lymphadenopathy, JVD Lungs: Clear to auscultation bilaterally without wheezes or crackles Cardiovascular: Irregularly irregular rhythm and rate without murmur gallop or rub normal S1 and S2 Abdomen: negative abdominal pain, nondistended, positive soft, bowel sounds, no rebound, no ascites, no appreciable mass Extremities: No significant cyanosis, clubbing, or edema bilateral lower extremities Skin: Negative rashes, lesions, ulcers Psychiatric:  Negative depression, negative anxiety, negative fatigue, negative mania  Central nervous system:  Cranial nerves II through XII intact, tongue/uvula midline, all extremities muscle strength 5/5, sensation intact throughout, negative dysarthria, negative expressive aphasia, negative receptive  aphasia.  .     Data Reviewed: Care during the described time interval was provided by me .  I have reviewed this patient's available data, including medical history, events of note, physical examination, and all test results as part of my evaluation.   CBC: Recent Labs  Lab 01/20/19 1637  WBC 7.5  NEUTROABS 4.9  HGB 15.4*  HCT 47.5*  MCV 98.8  PLT 465   Basic Metabolic Panel: Recent Labs  Lab 01/20/19 1637 01/21/19 0409 01/22/19 0419  NA 138 141 140  K 3.6 3.4* 3.2*  CL 103 104 102  CO2 22 26 26   GLUCOSE 132* 83 96  BUN 22 21 17   CREATININE 1.31* 1.30* 1.60*  CALCIUM 9.5 8.6* 8.7*  MG  --   --  1.8   GFR: Estimated Creatinine Clearance: 24 mL/min (A) (by C-G formula based on SCr of 1.6 mg/dL (H)). Liver Function Tests: Recent Labs  Lab 01/20/19 1637 01/21/19 0409 01/22/19 0419  AST 56* 34 32  ALT 47* 33 31  ALKPHOS 66 46 51  BILITOT 2.5* 1.8* 1.6*  PROT 8.0 5.7* 5.8*  ALBUMIN 4.7 3.4* 3.3*   No results for input(s): LIPASE, AMYLASE in the last 168 hours. No results for input(s): AMMONIA in the last 168 hours. Coagulation Profile: No results for input(s): INR, PROTIME in the last 168 hours.  Cardiac Enzymes: Recent Labs  Lab 01/20/19 1637  TROPONINI <0.03   BNP (last 3 results) No results for input(s): PROBNP in the last 8760 hours. HbA1C: No results for input(s): HGBA1C in the last 72 hours. CBG: No results for input(s): GLUCAP in the last 168 hours. Lipid Profile: No results for input(s): CHOL, HDL, LDLCALC, TRIG, CHOLHDL, LDLDIRECT in the last 72 hours. Thyroid Function Tests: No results for input(s): TSH, T4TOTAL, FREET4, T3FREE, THYROIDAB in the last 72 hours. Anemia Panel: No results for input(s): VITAMINB12, FOLATE, FERRITIN, TIBC, IRON, RETICCTPCT in the last 72 hours. Urine analysis:    Component Value Date/Time   COLORURINE COLORLESS (A) 12/24/2018 1650   APPEARANCEUR CLEAR 12/24/2018 1650   LABSPEC 1.004 (L) 12/24/2018 1650    PHURINE 7.0 12/24/2018 1650   GLUCOSEU NEGATIVE 12/24/2018 1650   HGBUR NEGATIVE 12/24/2018 1650   BILIRUBINUR NEGATIVE 12/24/2018 1650   KETONESUR NEGATIVE 12/24/2018 1650   PROTEINUR NEGATIVE 12/24/2018 1650   UROBILINOGEN 0.2 08/16/2008 2359   NITRITE NEGATIVE 12/24/2018 1650   LEUKOCYTESUR NEGATIVE 12/24/2018 1650   Sepsis Labs: @LABRCNTIP (procalcitonin:4,lacticidven:4)  )No results found for this or any previous visit (from the past 240 hour(s)).       Radiology Studies: Dg Chest 2 View  Result Date: 01/20/2019 CLINICAL DATA:  Recent pneumonia/bronchitis treated with antibiotics. History of atrial fibrillation. EXAM: CHEST - 2 VIEW COMPARISON:  Radiographs 12/24/2018 and 11/20/2018. FINDINGS: 1756 hours. Stable cardiomegaly and aortic atherosclerosis. There are small left-greater-than-right pleural effusions. There are diffuse interstitial opacities in both lungs which are similar to the most recent study, probably reflecting edema. There is no confluent airspace opacity or pneumothorax. The bones appear unchanged. Multiple telemetry leads overlie the chest. IMPRESSION: Overall, little change is seen from the most recent study 4 weeks ago. There is cardiomegaly with diffuse interstitial opacities and left-greater-than-right pleural effusions, most consistent with congestive heart failure or atypical infection. No confluent airspace opacity. Electronically Signed   By: Richardean Sale M.D.   On: 01/20/2019 18:42        Scheduled Meds: . apixaban  5 mg Oral BID  . famotidine  20 mg Oral Daily  . mouth rinse  15 mL Mouth Rinse BID  . nebivolol  5 mg Oral Daily  . potassium chloride  40 mEq Oral BID   Continuous Infusions: . diltiazem (CARDIZEM) infusion 2.5 mg/hr (01/21/19 1613)     LOS: 0 days    Time spent: 40 minutes     Ishaq Maffei, Geraldo Docker, MD [facility] Pager [phone]   If 7PM-7AM, please contact night-coverage [email or web address] Password [facility]  01/22/2019, 7:46 AM

## 2019-01-22 NOTE — Progress Notes (Addendum)
Progress Note  Patient Name: Carol Hardin Date of Encounter: 01/22/2019  Primary Cardiologist: Carlyle Dolly, MD   Subjective   The patient feels more SOB today. She feels stressed out by her situation.  Inpatient Medications    Scheduled Meds: . apixaban  5 mg Oral BID  . famotidine  20 mg Oral Daily  . mouth rinse  15 mL Mouth Rinse BID  . nebivolol  5 mg Oral Daily  . potassium chloride  20 mEq Oral BID   Continuous Infusions: . diltiazem (CARDIZEM) infusion 2.5 mg/hr (01/21/19 1613)   PRN Meds: acetaminophen, busPIRone, ondansetron (ZOFRAN) IV, polyethylene glycol   Vital Signs    Vitals:   01/21/19 1226 01/21/19 1741 01/21/19 1957 01/21/19 2014  BP: (!) 97/57 95/69 117/83   Pulse: 67 62  (!) 105  Resp: (!) 21 (!) 24  (!) 24  Temp: 97.6 F (36.4 C) 97.7 F (36.5 C)    TempSrc: Oral Oral    SpO2: 93% 97%  95%  Weight: 61.3 kg     Height: 5\' 5"  (1.651 m)       Intake/Output Summary (Last 24 hours) at 01/22/2019 0650 Last data filed at 01/21/2019 2028 Gross per 24 hour  Intake 342 ml  Output 1000 ml  Net -658 ml   Filed Weights   01/20/19 1622 01/20/19 2215 01/21/19 1226  Weight: 63.5 kg 62.6 kg 61.3 kg   Telemetry    A-fib 90-110 BPM, multiple nsVTs max 4 beats - Personally Reviewed  ECG    No new ECG tracings today. - Personally Reviewed  Physical Exam   Physical Exam per MD:  GEN: Caucasian female  resting comfortably. Alert and in no acute distress.   Neck: Supple. No JVD. Cardiac: RRR. No murmurs, gallops, or rubs.  Respiratory: Clear to auscultation bilaterally. No wheezes, rhonchi, or rales. GI: Abdomen soft, non-distended, and non-tender. Bowel sounds present. Extremities: No lower extremity edema. No deformity. Radial and distal pedal pulses 2+ and equal bilaterally. Skin: Warm and dry. Neuro:  No focal deficits. Psych: Normal affect. Responds appropriately.  Labs    Chemistry Recent Labs  Lab 01/20/19 1637  01/21/19 0409 01/22/19 0419  NA 138 141 140  K 3.6 3.4* 3.2*  CL 103 104 102  CO2 22 26 26   GLUCOSE 132* 83 96  BUN 22 21 17   CREATININE 1.31* 1.30* 1.60*  CALCIUM 9.5 8.6* 8.7*  PROT 8.0 5.7* 5.8*  ALBUMIN 4.7 3.4* 3.3*  AST 56* 34 32  ALT 47* 33 31  ALKPHOS 66 46 51  BILITOT 2.5* 1.8* 1.6*  GFRNONAA 38* 38* 29*  GFRAA 44* 44* 34*  ANIONGAP 13 11 12     Hematology Recent Labs  Lab 01/20/19 1637  WBC 7.5  RBC 4.81  HGB 15.4*  HCT 47.5*  MCV 98.8  MCH 32.0  MCHC 32.4  RDW 14.7  PLT 203   Cardiac Enzymes Recent Labs  Lab 01/20/19 1637  TROPONINI <0.03   No results for input(s): TROPIPOC in the last 168 hours.   BNP Recent Labs  Lab 01/20/19 1637  BNP 1,638.0*    DDimer No results for input(s): DDIMER in the last 168 hours.   Radiology    Dg Chest 2 View  Result Date: 01/20/2019 CLINICAL DATA:  Recent pneumonia/bronchitis treated with antibiotics. History of atrial fibrillation. EXAM: CHEST - 2 VIEW COMPARISON:  Radiographs 12/24/2018 and 11/20/2018. FINDINGS: 1756 hours. Stable cardiomegaly and aortic atherosclerosis. There are small left-greater-than-right pleural effusions.  There are diffuse interstitial opacities in both lungs which are similar to the most recent study, probably reflecting edema. There is no confluent airspace opacity or pneumothorax. The bones appear unchanged. Multiple telemetry leads overlie the chest. IMPRESSION: Overall, little change is seen from the most recent study 4 weeks ago. There is cardiomegaly with diffuse interstitial opacities and left-greater-than-right pleural effusions, most consistent with congestive heart failure or atypical infection. No confluent airspace opacity. Electronically Signed   By: Richardean Sale M.D.   On: 01/20/2019 18:42   Cardiac Studies   Echocardiogram 11/19/2018:  1. The left ventricle has mild-moderately reduced systolic function of 88-32%. The cavity size was normal. There is concentric left  ventricular hypertrophy. Left ventricular diastology could not be evaluated secondary to atrial fibrillation. Elevated  left ventricular end-diastolic pressure Left ventricular diffuse hypokinesis.  2. The right ventricle has moderately reduced systolic function. The cavity was mildly enlarged. There is no increase in right ventricular wall thickness.  3. Left atrial size was mildly dilated.  4. Right atrial size was moderately dilated.  5. Moderate pleural effusion in the left lateral region.  6. The mitral valve is myxomatous. There is mild thickening. Mitral valve regurgitation is moderate by color flow Doppler.  7. The tricuspid valve is normal in structure.  8. The aortic valve is tricuspid Aortic valve regurgitation is mild by color flow Doppler.  9. There is dilatation of the aortic root. 10. The inferior vena cava was dilated in size with >50% respiratory variability.    Patient Profile   Carol Hardin is a 85 y.o. female with a history of chronic atrial fibrillation, tachybradycardia syndrome, chronic systolic heart failure with EF of 40-45% on Echo in 11/2018, moderate RV dysfunction, hypertension, and anxiety, who is being seen today for evaluation of atrial fibrillation with RVR and acute CHF at the request of Dr. Carles Collet.  Assessment & Plan    Chronic Atrial Fibrillation and Tachy-Brady Syndrome - Patient presented with multiple complaints including some GI issues, shortness of breath, near syncope, and chest tightness. She was found to be in atrial fibrillation with RVR in ED. - Patient not on any AV nodal agents at home due to history of significant bradycardia on these medications. Previously felt to be a Dofetilide candidate but had not yet initiated due to COVID-19 restrictions. She was recently started on Amiodarone on 12/2018 admission but has been unable to tolerate this and stopped taking it. - Telemetry shows a-fib with RVR with ventricular rates 90-110 - Potassium 3.2 today.  Goal > 4.0. Replete as needed. - Magnesium 1.8 today. Goal > 2.0.  - Dr. Curt Bears discussed options with patient yesterday including restarting Amiodarone vs. Dofetilide loading. Patient is concerned about the side effects of Dofetilide and Amiodarone and wishes to have a rate control strategy at this time. Therefore, patient was started on Bystolic 5mg  daily yesterday. - she doesn't tolerate BB and CCB sec to hypotension, rhuthm control discussed again, but she refuses both amiodarone and dofetilide for the fear of side effects - would not use Digoxin in the settings of acute of chronic renal failure and hypokalemia  Acute on Chronic Systolic CHF - Possibly exacerbated by her atrial fibrillation with RVR. - LVEF of 40-45% on Echo in 11/2018. - she appears euvolemic, weight down 6 lbs, 5 lbs bellow baseline, care up, hypotensive, would hold lasix and encourage gentle PO hydration - Patient received 2 doses of IV Lasix 40mg  yesterday with good response. Documented urine output of 2  L in the past 24 hours and net negative 3.4 L since admission. Weight down from 138 lbs on admission to 132.8 lbs today. Dry weight reportedly around 137 lbs. - Continue Bystolic as above. - Continue to monitor daily weights, strict I/O's, and renal function.  AKI - Serum creatinine 1.31 on admission and up to 1.60 today. Baseline appears to be 1.0 to 1.3. - Continue to monitor daily.  For questions or updates, please contact [place] Please consult [email or web address] for contact info under Cardiology/STEMI.     Signed, Ena Dawley, MD 01/22/2019, 6:50 AM   Pager: [phone]

## 2019-01-22 NOTE — Progress Notes (Signed)
Pt BP 85/71, HR 110. Dilt gtt @ 2.5mg /hr. Nebivolol held this am due to BP. Cardiology Sarajane Jews, [place] paged. Will continue to monitor

## 2019-01-22 NOTE — Progress Notes (Signed)
Pt HR sustained 140's-150's. BP 107/79. Cardiology paged.

## 2019-01-22 NOTE — Patient Outreach (Signed)
[facility]) Care Management    Carol Hardin 11/12/1932 [id number]    Follow up outreach with Carol Hardin. States she is "doing ok." Reports weight of 140lbs, BP of 105/67, pulse of 60bpm. Reports her symptoms have not worsened since discharge but continues to experience shortness of breath with exertion.  Carol Hardin confirmed telephonic follow-up with Carol Cooley, FNP on yesterday. Pepcid 200mg  daily was ordered.  Reports speaking with Dr. Harl Bowie today. She was instructed to take Lasix 20mg  daily and hold amiodarone until follow-up outreach on Tuesday.  Discussed recommendations and restrictions related to COVID-19. Reviewed s/sx that require immediate medical attention. Carol Hardin verbalized understanding and agreed to contact RNCM if prior to next outreach.  PLAN Will follow up next week.    [place] [phone]

## 2019-01-23 ENCOUNTER — Inpatient Hospital Stay (HOSPITAL_COMMUNITY)
Admission: EM | Disposition: A | Discharge status: Home Health | Payer: Self-pay | Source: Home / Self Care | Attending: Internal Medicine

## 2019-01-23 ENCOUNTER — Ambulatory Visit: Payer: Self-pay

## 2019-01-23 DIAGNOSIS — Z950 Presence of cardiac pacemaker: Secondary | ICD-10-CM

## 2019-01-23 DIAGNOSIS — I5022 Chronic systolic (congestive) heart failure: Secondary | ICD-10-CM

## 2019-01-23 DIAGNOSIS — I4819 Other persistent atrial fibrillation: Principal | ICD-10-CM

## 2019-01-23 DIAGNOSIS — I495 Sick sinus syndrome: Secondary | ICD-10-CM

## 2019-01-23 HISTORY — PX: AV NODE ABLATION: EP1193

## 2019-01-23 HISTORY — PX: BIV PACEMAKER INSERTION CRT-P: EP1199

## 2019-01-23 LAB — CBC
HCT: 41.5 % (ref 36.0–46.0)
Hemoglobin: 13.2 g/dL (ref 12.0–15.0)
MCH: 30.9 pg (ref 26.0–34.0)
MCHC: 31.8 g/dL (ref 30.0–36.0)
MCV: 97.2 fL (ref 80.0–100.0)
Platelets: 179 10*3/uL (ref 150–400)
RBC: 4.27 MIL/uL (ref 3.87–5.11)
RDW: 14.6 % (ref 11.5–15.5)
WBC: 7.1 10*3/uL (ref 4.0–10.5)
nRBC: 0 % (ref 0.0–0.2)

## 2019-01-23 LAB — BASIC METABOLIC PANEL
Anion gap: 11 (ref 5–15)
BUN: 17 mg/dL (ref 8–23)
CO2: 24 mmol/L (ref 22–32)
Calcium: 9.1 mg/dL (ref 8.9–10.3)
Chloride: 103 mmol/L (ref 98–111)
Creatinine, Ser: 1.28 mg/dL — ABNORMAL HIGH (ref 0.44–1.00)
GFR calc Af Amer: 45 mL/min — ABNORMAL LOW (ref >60)
GFR calc non Af Amer: 39 mL/min — ABNORMAL LOW (ref >60)
Glucose, Bld: 90 mg/dL (ref 70–99)
Potassium: 4.1 mmol/L (ref 3.5–5.1)
Sodium: 138 mmol/L (ref 135–145)

## 2019-01-23 LAB — LIPID PANEL
Cholesterol: 140 mg/dL (ref 0–200)
HDL: 42 mg/dL (ref >40)
LDL Cholesterol: 85 mg/dL (ref 0–99)
Total CHOL/HDL Ratio: 3.3 RATIO
Triglycerides: 64 mg/dL (ref <150)
VLDL: 13 mg/dL (ref 0–40)

## 2019-01-23 LAB — MAGNESIUM: Magnesium: 2.3 mg/dL (ref 1.7–2.4)

## 2019-01-23 SURGERY — BIV PACEMAKER INSERTION CRT-P

## 2019-01-23 MED ORDER — MIDAZOLAM HCL 5 MG/5ML IJ SOLN
INTRAMUSCULAR | Status: DC | PRN
  Administered 2019-01-23: 1 mg via INTRAVENOUS

## 2019-01-23 MED ORDER — ACETAMINOPHEN 325 MG PO TABS
325.0000 mg | ORAL_TABLET | ORAL | Status: DC | PRN
  Administered 2019-01-23 – 2019-01-24 (×2): 650 mg via ORAL
  Filled 2019-01-23 (×2): qty 2

## 2019-01-23 MED ORDER — HYDROCODONE-ACETAMINOPHEN 5-325 MG PO TABS: 1.0000 | ORAL_TABLET | ORAL | Status: DC | PRN

## 2019-01-23 MED ORDER — FENTANYL CITRATE (PF) 100 MCG/2ML IJ SOLN
INTRAMUSCULAR | Status: AC
  Filled 2019-01-23: qty 2

## 2019-01-23 MED ORDER — SODIUM CHLORIDE 0.9 % IV SOLN
INTRAVENOUS | Status: AC | PRN
  Administered 2019-01-23: 250 mL via INTRAVENOUS

## 2019-01-23 MED ORDER — SODIUM CHLORIDE 0.9% FLUSH
3.0000 mL | Freq: Two times a day (BID) | INTRAVENOUS | Status: DC
  Administered 2019-01-23 – 2019-01-24 (×3): 3 mL via INTRAVENOUS

## 2019-01-23 MED ORDER — SODIUM CHLORIDE 0.9% FLUSH: 3.0000 mL | INTRAVENOUS | Status: DC | PRN

## 2019-01-23 MED ORDER — SODIUM CHLORIDE 0.9 % IV SOLN
INTRAVENOUS | Status: AC
  Filled 2019-01-23: qty 2

## 2019-01-23 MED ORDER — IOHEXOL 350 MG/ML SOLN
INTRAVENOUS | Status: DC | PRN
  Administered 2019-01-23: 25 mL via INTRAVENOUS
  Administered 2019-01-23: 09:00:00 15 mL via INTRAVENOUS

## 2019-01-23 MED ORDER — LIDOCAINE HCL (PF) 1 % IJ SOLN
INTRAMUSCULAR | Status: DC | PRN
  Administered 2019-01-23: 60 mL

## 2019-01-23 MED ORDER — HEPARIN SODIUM (PORCINE) 1000 UNIT/ML IJ SOLN
INTRAMUSCULAR | Status: DC | PRN
  Administered 2019-01-23: 3000 [IU] via INTRAVENOUS

## 2019-01-23 MED ORDER — SODIUM CHLORIDE 0.9 % IV SOLN: 250.0000 mL | INTRAVENOUS | Status: DC | PRN

## 2019-01-23 MED ORDER — VANCOMYCIN HCL IN DEXTROSE 1-5 GM/200ML-% IV SOLN
INTRAVENOUS | Status: AC
  Filled 2019-01-23: qty 200

## 2019-01-23 MED ORDER — LIDOCAINE HCL 1 % IJ SOLN
INTRAMUSCULAR | Status: AC
  Filled 2019-01-23: qty 60

## 2019-01-23 MED ORDER — BUPIVACAINE HCL (PF) 0.25 % IJ SOLN
INTRAMUSCULAR | Status: AC
  Filled 2019-01-23: qty 30

## 2019-01-23 MED ORDER — HEPARIN (PORCINE) IN NACL 1000-0.9 UT/500ML-% IV SOLN
INTRAVENOUS | Status: AC
  Filled 2019-01-23: qty 500

## 2019-01-23 MED ORDER — FENTANYL CITRATE (PF) 100 MCG/2ML IJ SOLN
INTRAMUSCULAR | Status: DC | PRN
  Administered 2019-01-23: 12.5 ug via INTRAVENOUS

## 2019-01-23 MED ORDER — HEPARIN (PORCINE) IN NACL 1000-0.9 UT/500ML-% IV SOLN
INTRAVENOUS | Status: DC | PRN
  Administered 2019-01-23: 500 mL

## 2019-01-23 MED ORDER — ONDANSETRON HCL 4 MG/2ML IJ SOLN: 4.0000 mg | Freq: Four times a day (QID) | INTRAMUSCULAR | Status: DC | PRN

## 2019-01-23 MED ORDER — BUPIVACAINE HCL (PF) 0.25 % IJ SOLN
INTRAMUSCULAR | Status: DC | PRN
  Administered 2019-01-23: 30 mL

## 2019-01-23 MED ORDER — VANCOMYCIN HCL 1000 MG IV SOLR
1000.0000 mg | Freq: Two times a day (BID) | INTRAVENOUS | Status: AC
  Administered 2019-01-23: 1000 mg via INTRAVENOUS
  Filled 2019-01-23: qty 1000

## 2019-01-23 MED ORDER — MIDAZOLAM HCL 5 MG/5ML IJ SOLN
INTRAMUSCULAR | Status: AC
  Filled 2019-01-23: qty 5

## 2019-01-23 SURGICAL SUPPLY — 24 items
ADAPTER SEALING SSA-EW-09 (MISCELLANEOUS) ×3 IMPLANT
BALLN COR SINUS VENO 6F 80CM (BALLOONS) ×1
BALLN COR SINUS VENO 6FR 80 (BALLOONS) ×2
BALLOON COR SINUS VENO 6FR 80 (BALLOONS) ×1 IMPLANT
CABLE SURGICAL S-101-97-12 (CABLE) ×3 IMPLANT
CATH ATTAIN COM SURV 6250V-MB2 (CATHETERS) ×3 IMPLANT
CATH HEX JOS 2-5-2 65CM 6F REP (CATHETERS) ×3 IMPLANT
CATH THERM 7FR 4MM 5031TK1 (ABLATOR) ×3 IMPLANT
KIT ESSENTIALS PG (KITS) ×3 IMPLANT
LEAD QUARTET 1458Q-86CM (Lead) ×3 IMPLANT
LEAD TENDRIL MRI 52CM LPA1200M (Lead) ×3 IMPLANT
LEAD TENDRIL MRI 58CM LPA1200M (Lead) ×3 IMPLANT
PACEMAKER QUDR ALLR CRT PM3562 (Pacemaker) ×1 IMPLANT
PACK EP LATEX FREE (CUSTOM PROCEDURE TRAY) ×2
PACK EP LF (CUSTOM PROCEDURE TRAY) ×1 IMPLANT
PAD PRO RADIOLUCENT 2001M-C (PAD) ×3 IMPLANT
PMKR QUADRA ALLURE CRT PM3562 (Pacemaker) ×3 IMPLANT
SHEATH CLASSIC 8F (SHEATH) ×6 IMPLANT
SHEATH CLASSIC 9.5F (SHEATH) ×3 IMPLANT
SHEATH PINNACLE 8F 10CM (SHEATH) ×6 IMPLANT
SLITTER 6232ADJ (MISCELLANEOUS) ×3 IMPLANT
TRAY PACEMAKER INSERTION (PACKS) ×3 IMPLANT
WIRE ACUITY WHISPER EDS 4648 (WIRE) ×6 IMPLANT
WIRE HI TORQ VERSACORE-J 145CM (WIRE) ×3 IMPLANT

## 2019-01-23 NOTE — Progress Notes (Signed)
PROGRESS NOTE    Carol Hardin  TML:[id number] DOB: 1933-06-12 DOA: 01/20/2019 PCP: Celene Squibb, MD   Brief Narrative:  85-year WF PMHx, depression, generalized anxiety DO, cardiomyopathy (EF 40%) tachybradycardia syndrome,Chronic atrial fibrillation,  NSVT, MVP HLD, CKD stage III  Presenting with 3-4 day history of nonproductive cough and shortness of breath.  The patient denied any fevers, chills, hemoptysis, recent travels or sick contacts.  She has noted increasing lower extremity edema.  She has been compliant with social distancing measures in the context of the coronavirus pandemic and she denies any COVID positive or other sick contacts.  She had a recent hospital admit from 12/24/18 to 12/29/18 during which she was treated from acute on chronic systolic CHF and exacerbation of her tachy-brady syndrome.  She was started on an amiodarone drip and ultimately discharged on amiodarone 200 mg bid.  Her discharge weight was 137 lbs.  It has been documented by cardiology and EP that the patient likely needs to be initiated on Tikosyn.  Because of a variety of factors (pt delay in decision making, delay due to dental work, and now the current COVID-19 pandemic), Tikosyn initiation has been delayed. The patient's amiodarone was decreased to 200 mg once daily on 01/10/19 due to possible side effects (nausea, heartburn, regurgitation after eating, abdominal discomfort).   Patient had a virtual visit with Dr. Harl Bowie on 01/12/19.  She was instructed to hold amio x 3 days and restart lasix 20 mg po daily.  Since decreasing amio to once daily, she has noted increasing sob and LE edema.   She had another recent admission from 11/18/2018 through 11/22/2018 during which she was treated for pneumonia and tachybradycardia syndrome.  Her discharge weight was 138.2 pounds.    In the ED, the patient was afebrile and hemodynamically stable with heart rate 140s.  The patient was started on diltiazem drip with improvement  of her heart rate.  She was admitted to the stepdown unit for further treatment and evaluation. BNP was noted to be 1638.  Chest x-ray bilateral increased interstitial markings.  The patient was initiated on IV furosemide.    Subjective: 4/14 A/O x4, negative S OB, negative CP, negative abdominal pain.  Positive left shoulder pain.  Patient appears much more comfortable and states she feels better.   Assessment & Plan:   Principal Problem:   Atrial fibrillation with RVR (HCC) Active Problems:   Generalized anxiety disorder   Essential hypertension   Hypokalemia   GERD (gastroesophageal reflux disease)   Atrial fibrillation, chronic   Acute on chronic systolic CHF (congestive heart failure) (HCC)   Tachy-brady syndrome (HCC)   CKD (chronic kidney disease) stage 3, GFR 30-59 ml/min (HCC)   Sick sinus syndrome (HCC)   Acute on chronic systolic CHF - Patient appears euvolemic - 3/20 discharge weight 137.3 pounds (62.2 kg) - 11/19/2018 echocardiogram:-EF 40- 55%, diffuse hypokinesis, moderate decreased RV function, moderate MR with mild myxomatous -Bystolic 7.5 milligrams daily -Strict in and out -3.4 L -Daily weight Filed Weights   01/20/19 2215 01/21/19 1226 01/22/19 0656  Weight: 62.6 kg 61.3 kg 60.2 kg  -  Tachybradycardia syndrome (sick sinus syndrome) with atrial fibrillation (chronic) -3/14 s/p placement of PIV pacemaker, AV node ablation --Discontinue bisoprolol due to hypotension on last admission - Eliquis 5 mg BID (hold) - Diltiazem drip discontinued s/p pacer placement - Patient feeling significantly improved   Generalized anxiety disorder -Patient extremely anxious that she obtained pacer prior to discharge (believe she  will die as she does not obtain)  -Ativan 1 mg nightly -Buspirone 5 mg  TID   CKD stage III (baseline Cr 115- mid 120) -Monitor creatinine closely Recent Labs  Lab 01/20/19 1637 01/21/19 0409 01/22/19 0419 01/23/19 0631  CREATININE 1.31*  1.30* 1.60* 1.28*  Improving   Hypokalemia - Potassium goal>4  Hypomagnesmia - Magnesium goal> 2  Hyperbilirubinemia -mostly indirect -doubt hemolysis -Negative sign or symptom of hemolysis.  Goals of care - 4/14 PT/OT consult placed evaluate for SNF vs CIR vs home   DVT prophylaxis: SCD Code Status: Full Family Communication: None Disposition Plan: Per cardiology   Consultants:  Cardiology     Procedures/Significant Events:  4/14 S/p placement of PIV pacemaker, AV node ablation      I have personally reviewed and interpreted all radiology studies and my findings are as above.  VENTILATOR SETTINGS:    Cultures   Antimicrobials:    Devices    LINES / TUBES:      Continuous Infusions: . sodium chloride 10 mL/hr at 01/23/19 0607  . sodium chloride 10 mL/hr at 01/23/19 0609  . [MAR Hold] diltiazem (CARDIZEM) infusion Stopped (01/23/19 0344)  . vancomycin 1,000 mg (01/23/19 0810)     Objective: Vitals:   01/22/19 2006 01/23/19 0030 01/23/19 0500 01/23/19 0808  BP: 107/86 98/70 90/62    Pulse: (!) 51 78    Resp: 15 20 (!) 24   Temp: 98.1 F (36.7 C) 97.7 F (36.5 C) 97.7 F (36.5 C)   TempSrc: Oral Axillary Axillary   SpO2: 98% 94% 98% 94%  Weight:      Height:        Intake/Output Summary (Last 24 hours) at 01/23/2019 0926 Last data filed at 01/23/2019 3267 Gross per 24 hour  Intake 466.41 ml  Output 650 ml  Net -183.59 ml   Filed Weights   01/20/19 2215 01/21/19 1226 01/22/19 0656  Weight: 62.6 kg 61.3 kg 60.2 kg   General: A/O x4, no acute respiratory distress Eyes: negative scleral hemorrhage, negative anisocoria, negative icterus ENT: Negative Runny nose, negative gingival bleeding, Neck:  Negative scars, masses, torticollis, lymphadenopathy, JVD Lungs: Clear to auscultation bilaterally without wheezes or crackles Cardiovascular: Regular rate and rhythm (paced) without murmur gallop or rub normal S1 and S2.  Left arm in  sling with dressing in place over new pacer negative bleeding. Abdomen: negative abdominal pain, nondistended, positive soft, bowel sounds, no rebound, no ascites, no appreciable mass Extremities: No significant cyanosis, clubbing, or edema bilateral lower extremities, dressing in place right groin negative bleeding. Skin: Negative rashes, lesions, ulcers Psychiatric:  Negative depression, negative anxiety, negative fatigue, negative mania  Central nervous system:  Cranial nerves II through XII intact, tongue/uvula midline, all extremities muscle strength 5/5, sensation intact throughout negative dysarthria, negative expressive aphasia, negative receptive aphasia.      Data Reviewed: Care during the described time interval was provided by me .  I have reviewed this patient's available data, including medical history, events of note, physical examination, and all test results as part of my evaluation.   CBC: Recent Labs  Lab 01/20/19 1637 01/23/19 0631  WBC 7.5 7.1  NEUTROABS 4.9  --   HGB 15.4* 13.2  HCT 47.5* 41.5  MCV 98.8 97.2  PLT 203 124   Basic Metabolic Panel: Recent Labs  Lab 01/20/19 1637 01/21/19 0409 01/22/19 0419 01/23/19 0631  NA 138 141 140 138  K 3.6 3.4* 3.2* 4.1  CL 103 104 102 103  CO2 22 26 26 24   GLUCOSE 132* 83 96 90  BUN 22 21 17 17   CREATININE 1.31* 1.30* 1.60* 1.28*  CALCIUM 9.5 8.6* 8.7* 9.1  MG  --   --  1.8 2.3   GFR: Estimated Creatinine Clearance: 30 mL/min (A) (by C-G formula based on SCr of 1.28 mg/dL (H)). Liver Function Tests: Recent Labs  Lab 01/20/19 1637 01/21/19 0409 01/22/19 0419  AST 56* 34 32  ALT 47* 33 31  ALKPHOS 66 46 51  BILITOT 2.5* 1.8* 1.6*  PROT 8.0 5.7* 5.8*  ALBUMIN 4.7 3.4* 3.3*   No results for input(s): LIPASE, AMYLASE in the last 168 hours. No results for input(s): AMMONIA in the last 168 hours. Coagulation Profile: No results for input(s): INR, PROTIME in the last 168 hours. Cardiac Enzymes: Recent  Labs  Lab 01/20/19 1637  TROPONINI <0.03   BNP (last 3 results) No results for input(s): PROBNP in the last 8760 hours. HbA1C: No results for input(s): HGBA1C in the last 72 hours. CBG: No results for input(s): GLUCAP in the last 168 hours. Lipid Profile: Recent Labs    01/23/19 0631  CHOL 140  HDL 42  LDLCALC 85  TRIG 64  CHOLHDL 3.3   Thyroid Function Tests: No results for input(s): TSH, T4TOTAL, FREET4, T3FREE, THYROIDAB in the last 72 hours. Anemia Panel: No results for input(s): VITAMINB12, FOLATE, FERRITIN, TIBC, IRON, RETICCTPCT in the last 72 hours. Urine analysis:    Component Value Date/Time   COLORURINE COLORLESS (A) 12/24/2018 1650   APPEARANCEUR CLEAR 12/24/2018 1650   LABSPEC 1.004 (L) 12/24/2018 1650   PHURINE 7.0 12/24/2018 1650   GLUCOSEU NEGATIVE 12/24/2018 1650   HGBUR NEGATIVE 12/24/2018 1650   BILIRUBINUR NEGATIVE 12/24/2018 1650   KETONESUR NEGATIVE 12/24/2018 1650   PROTEINUR NEGATIVE 12/24/2018 1650   UROBILINOGEN 0.2 08/16/2008 2359   NITRITE NEGATIVE 12/24/2018 1650   LEUKOCYTESUR NEGATIVE 12/24/2018 1650   Sepsis Labs: @LABRCNTIP (procalcitonin:4,lacticidven:4)  ) Recent Results (from the past 240 hour(s))  Surgical PCR screen     Status: None   Collection Time: 01/22/19  1:33 PM  Result Value Ref Range Status   MRSA, PCR NEGATIVE NEGATIVE Final   Staphylococcus aureus NEGATIVE NEGATIVE Final    Comment: (NOTE) The Xpert SA Assay (FDA approved for NASAL specimens in patients 75 years of age and older), is one component of a comprehensive surveillance program. It is not intended to diagnose infection nor to guide or monitor treatment. Performed at [facility], [place] [address], [place], [place] [address]          Radiology Studies: No results found.      Scheduled Meds: . [MAR Hold] apixaban  5 mg Oral BID  . [MAR Hold] famotidine  20 mg Oral Daily  . gentamicin irrigation  80 mg Irrigation To SS-Surg  . [MAR  Hold] LORazepam  1 mg Oral QHS  . [MAR Hold] mouth rinse  15 mL Mouth Rinse BID  . [MAR Hold] nebivolol  7.5 mg Oral Daily  . [MAR Hold] potassium chloride  40 mEq Oral BID   Continuous Infusions: . sodium chloride 10 mL/hr at 01/23/19 0607  . sodium chloride 10 mL/hr at 01/23/19 0609  . [MAR Hold] diltiazem (CARDIZEM) infusion Stopped (01/23/19 0344)  . vancomycin 1,000 mg (01/23/19 0810)     LOS: 1 day    Time spent: 40 minutes     Laterrian Hevener, Geraldo Docker, MD [facility] Pager [phone]   If 7PM-7AM, please  contact night-coverage [email or web address] Password [facility] 01/23/2019, 9:26 AM

## 2019-01-23 NOTE — Progress Notes (Signed)
Progress Note   Subjective   Doing well today, the patient denies CP or SOB.  No new concerns Elevated V rates are noted  Inpatient Medications    Scheduled Meds: . apixaban  5 mg Oral BID  . famotidine  20 mg Oral Daily  . gentamicin irrigation  80 mg Irrigation To SS-Surg  . LORazepam  1 mg Oral QHS  . mouth rinse  15 mL Mouth Rinse BID  . nebivolol  7.5 mg Oral Daily  . potassium chloride  40 mEq Oral BID   Continuous Infusions: . sodium chloride 10 mL/hr at 01/23/19 0607  . sodium chloride 10 mL/hr at 01/23/19 0609  . diltiazem (CARDIZEM) infusion Stopped (01/23/19 0344)  . vancomycin     PRN Meds: acetaminophen, busPIRone, ondansetron (ZOFRAN) IV, polyethylene glycol   Vital Signs    Vitals:   01/22/19 1756 01/22/19 2006 01/23/19 0030 01/23/19 0500  BP: (!) 109/98 107/86 98/70 90/62   Pulse: (!) 43 (!) 51 78   Resp: (!) 22 15 20  (!) 24  Temp:  98.1 F (36.7 C) 97.7 F (36.5 C) 97.7 F (36.5 C)  TempSrc:  Oral Axillary Axillary  SpO2: 96% 98% 94% 98%  Weight:      Height:        Intake/Output Summary (Last 24 hours) at 01/23/2019 0703 Last data filed at 01/23/2019 4540 Gross per 24 hour  Intake 688.41 ml  Output 650 ml  Net 38.41 ml   Filed Weights   01/20/19 2215 01/21/19 1226 01/22/19 0656  Weight: 62.6 kg 61.3 kg 60.2 kg    Telemetry    afib with RVR   Physical Exam   GEN- The patient is elderly appearing, alert and oriented x 3 today.   Head- normocephalic, atraumatic Eyes-  Sclera clear, conjunctiva pink Ears- hearing intact Oropharynx- clear Neck- supple, Extremities- no clubbing, cyanosis, or edema  MS- age appropriate atrophy Skin- no rash or lesion Psych- euthymic mood, full affect Neuro- strength and sensation are intact   Labs    Chemistry Recent Labs  Lab 01/20/19 1637 01/21/19 0409 01/22/19 0419  NA 138 141 140  K 3.6 3.4* 3.2*  CL 103 104 102  CO2 22 26 26   GLUCOSE 132* 83 96  BUN 22 21 17   CREATININE 1.31*  1.30* 1.60*  CALCIUM 9.5 8.6* 8.7*  PROT 8.0 5.7* 5.8*  ALBUMIN 4.7 3.4* 3.3*  AST 56* 34 32  ALT 47* 33 31  ALKPHOS 66 46 51  BILITOT 2.5* 1.8* 1.6*  GFRNONAA 38* 38* 29*  GFRAA 44* 44* 34*  ANIONGAP 13 11 12      Hematology Recent Labs  Lab 01/20/19 1637  WBC 7.5  RBC 4.81  HGB 15.4*  HCT 47.5*  MCV 98.8  MCH 32.0  MCHC 32.4  RDW 14.7  PLT 203    Cardiac Enzymes Recent Labs  Lab 01/20/19 1637  TROPONINI <0.03   No results for input(s): TROPIPOC in the last 168 hours.     Patient Profile   Carol Hardin is a 85 y.o. female with a history of persistent AF, tachy/brady syndrome, CHF, anxiety presenting with AF with RVR and SOB.  Assessment & Plan    1.  Persistent afib with RVR V rates have been very difficult to control due to hypotension She has also had symptomatic sinus bradycardia previously She is not interested in further AAD options after recently failing medical therapy with amiodarone due to medicine intolerance. She is anticoagulated with eliquis.  She has a nonischemic CM (EF 40%) which is likely tachycardia mediated.  She has LBBB and prior symptomatic sinus bradycardia.   I would therefore recommend biventricular pacemaker implantation with AV nodal ablation at this time.  Risks, benefits, alternatives to the procedure were discussed in detail with the patient today. The patient understands that the risks include but are not limited to bleeding, infection, pneumothorax, perforation, tamponade, vascular damage, renal failure, MI, stroke, death,  and lead dislodgement and wishes to proceed.  2. Nonischemic CM/ LBBB As above  3. Acute renal failure Likely exacerbated by RVR of afib  Pt is at increased medical risk given uncontrolled afib.  A high level of decision making, including discussions with Dr Curt Bears were required for this encounter.  Thompson Grayer MD, [facility] 01/23/2019 7:03 AM

## 2019-01-23 NOTE — Consult Note (Signed)
   [facility] CM Inpatient Consult   01/23/2019  Carol Hardin 25-Jun-1933 [id number]  Patient with less than 30 day readmission with a medium risk score for unplanned readmission. Patient is currently active with [facility] for chronic disease management services.  Patient has been engaged by a [place].  Chart reviewed and MD notes are as follow for History and physical:  Carol Hardin is a 85 y.o. female with a history of paroxysmal atrial fibrillation on amiodarone, GERD, hypertension, mitral valve prolapse, combined systolic and diastolic heart failure.  Patient seen for 3 to 4 days of worsening shortness of breath that is worse with lying down flat and exertion.  Symptoms are improved with sitting at side of the bed and rest from exertion. Patient underwent a insertion of pacemaker.    Our community based plan of care has focused on disease management and community resource support.  Patient will receive a post hospital call and will be evaluated for assessments and disease process education.  Notified TOC Inpatient team member to make  aware that [facility] following via phone. Of note, [facility] Care Management services does not replace or interfere with any services that are needed or arranged by inpatient case management or social work.  For additional questions or referrals please contact:  Natividad Brood, RN BSN [facility] Liaison  [phone] business mobile phone Toll free office [phone]

## 2019-01-23 NOTE — Discharge Instructions (Signed)
Right groin site/activity/care instructions No lifting over 5 lbs for 1 week. No vigorous or sexual activity for 1 week. Keep procedure site clean & dry. If you notice increased pain, swelling, bleeding or pus, call/return!  You may shower, but no soaking baths/hot tubs/pools for 1 week.     ONCE HOME, PLEASE SEND A REMOTE PACEMAKER TRANSMISSION  and  PLEASE SIGN UP TO YOUR MY CHART ACCOUNT WHEN YOU GET HOME (if you aren't already).  IN THE CURRENT ENVIRONMENT WITH COVID-19, IN EFFORT TO REDUCE YOUR EXPOSURE WE WILL BE CONDUCTING MANY PATIENT VISITS BY EITHER VIRTUAL/VIDEO VISITS or TELEPHONE VISITS.  BEING SIGNED UP IN YOUR MY CHART ACCOUNT  WILL HELP FACILITATE THESE VISITS AND OUR COMMUNICATION WITH YOU         Supplemental Discharge Instructions for  Pacemaker Patients  Activity No heavy lifting or vigorous activity with your left arm for 6 to 8 weeks.  Do not raise your left arm above your head for one week.  Gradually raise your affected arm as drawn below.              01/27/2019                01/28/2019                 01/29/2019              01/30/2019 __  NO DRIVING for  1 week   ; you may begin driving on  2/44/6286   .  WOUND CARE - Keep the wound area clean and dry.  Do not get this area wet, no showers until cleared to at your wound check visit - The tape/steri-strips on your wound will fall off; do not pull them off.  No bandage is needed on the site.  DO  NOT apply any creams, oils, or ointments to the wound area. - If you notice any drainage or discharge from the wound, any swelling or bruising at the site, or you develop a fever > 101? F after you are discharged home, call the office at once.  Special Instructions - You are still able to use cellular telephones; use the ear opposite the side where you have your pacemaker/defibrillator.  Avoid carrying your cellular phone near your device. - When traveling through airports, show security personnel your  identification card to avoid being screened in the metal detectors.  Ask the security personnel to use the hand wand. - Avoid arc welding equipment, MRI testing (magnetic resonance imaging), TENS units (transcutaneous nerve stimulators).  Call the office for questions about other devices. - Avoid electrical appliances that are in poor condition or are not properly grounded. - Microwave ovens are safe to be near or to operate.

## 2019-01-23 NOTE — Progress Notes (Signed)
Site area:Right Femoral Vein, Right Femoral Artery  Site Prior to Removal: level 0  Pressure Applied For:  25 Mins  Manual:Yes  Patient Status During Pull: without complaints, A/O, resting comfortably, VSS  Post Pull Groin Site: level 0  Post Pull Instructions Given:  yes, verbalized and demonstrated understanding  Post Pull Pulses Present:  Right DP  Dressing Applied:  Gauze, tegaderm  Bedrest started at:  1055

## 2019-01-23 NOTE — H&P (View-Only) (Signed)
Progress Note   Subjective   Doing well today, the patient denies CP or SOB.  No new concerns Elevated V rates are noted  Inpatient Medications    Scheduled Meds: . apixaban  5 mg Oral BID  . famotidine  20 mg Oral Daily  . gentamicin irrigation  80 mg Irrigation To SS-Surg  . LORazepam  1 mg Oral QHS  . mouth rinse  15 mL Mouth Rinse BID  . nebivolol  7.5 mg Oral Daily  . potassium chloride  40 mEq Oral BID   Continuous Infusions: . sodium chloride 10 mL/hr at 01/23/19 0607  . sodium chloride 10 mL/hr at 01/23/19 0609  . diltiazem (CARDIZEM) infusion Stopped (01/23/19 0344)  . vancomycin     PRN Meds: acetaminophen, busPIRone, ondansetron (ZOFRAN) IV, polyethylene glycol   Vital Signs    Vitals:   01/22/19 1756 01/22/19 2006 01/23/19 0030 01/23/19 0500  BP: (!) 109/98 107/86 98/70 90/62   Pulse: (!) 43 (!) 51 78   Resp: (!) 22 15 20  (!) 24  Temp:  98.1 F (36.7 C) 97.7 F (36.5 C) 97.7 F (36.5 C)  TempSrc:  Oral Axillary Axillary  SpO2: 96% 98% 94% 98%  Weight:      Height:        Intake/Output Summary (Last 24 hours) at 01/23/2019 0703 Last data filed at 01/23/2019 3903 Gross per 24 hour  Intake 688.41 ml  Output 650 ml  Net 38.41 ml   Filed Weights   01/20/19 2215 01/21/19 1226 01/22/19 0656  Weight: 62.6 kg 61.3 kg 60.2 kg    Telemetry    afib with RVR   Physical Exam   GEN- The patient is elderly appearing, alert and oriented x 3 today.   Head- normocephalic, atraumatic Eyes-  Sclera clear, conjunctiva pink Ears- hearing intact Oropharynx- clear Neck- supple, Extremities- no clubbing, cyanosis, or edema  MS- age appropriate atrophy Skin- no rash or lesion Psych- euthymic mood, full affect Neuro- strength and sensation are intact   Labs    Chemistry Recent Labs  Lab 01/20/19 1637 01/21/19 0409 01/22/19 0419  NA 138 141 140  K 3.6 3.4* 3.2*  CL 103 104 102  CO2 22 26 26   GLUCOSE 132* 83 96  BUN 22 21 17   CREATININE 1.31*  1.30* 1.60*  CALCIUM 9.5 8.6* 8.7*  PROT 8.0 5.7* 5.8*  ALBUMIN 4.7 3.4* 3.3*  AST 56* 34 32  ALT 47* 33 31  ALKPHOS 66 46 51  BILITOT 2.5* 1.8* 1.6*  GFRNONAA 38* 38* 29*  GFRAA 44* 44* 34*  ANIONGAP 13 11 12      Hematology Recent Labs  Lab 01/20/19 1637  WBC 7.5  RBC 4.81  HGB 15.4*  HCT 47.5*  MCV 98.8  MCH 32.0  MCHC 32.4  RDW 14.7  PLT 203    Cardiac Enzymes Recent Labs  Lab 01/20/19 1637  TROPONINI <0.03   No results for input(s): TROPIPOC in the last 168 hours.     Patient Profile   Carol Hardin is a 85 y.o. female with a history of persistent AF, tachy/brady syndrome, CHF, anxiety presenting with AF with RVR and SOB.  Assessment & Plan    1.  Persistent afib with RVR V rates have been very difficult to control due to hypotension She has also had symptomatic sinus bradycardia previously She is not interested in further AAD options after recently failing medical therapy with amiodarone due to medicine intolerance. She is anticoagulated with eliquis.  She has a nonischemic CM (EF 40%) which is likely tachycardia mediated.  She has LBBB and prior symptomatic sinus bradycardia.   I would therefore recommend biventricular pacemaker implantation with AV nodal ablation at this time.  Risks, benefits, alternatives to the procedure were discussed in detail with the patient today. The patient understands that the risks include but are not limited to bleeding, infection, pneumothorax, perforation, tamponade, vascular damage, renal failure, MI, stroke, death,  and lead dislodgement and wishes to proceed.  2. Nonischemic CM/ LBBB As above  3. Acute renal failure Likely exacerbated by RVR of afib  Pt is at increased medical risk given uncontrolled afib.  A high level of decision making, including discussions with Dr Curt Bears were required for this encounter.  Thompson Grayer MD, [facility] 01/23/2019 7:03 AM

## 2019-01-23 NOTE — Interval H&P Note (Signed)
History and Physical Interval Note:  01/23/2019 7:09 AM  Carol Hardin  has presented today for surgery, with the diagnosis of afib - heart block.  The various methods of treatment have been discussed with the patient and family. After consideration of risks, benefits and other options for treatment, the patient has consented to  Procedure(s): BIV PACEMAKER INSERTION CRT-P (N/A) AV NODE ABLATION (N/A) as a surgical intervention.  The patient's history has been reviewed, patient examined, no change in status, stable for surgery.  I have reviewed the patient's chart and labs.  Questions were answered to the patient's satisfaction.     Thompson Grayer

## 2019-01-23 NOTE — TOC Initial Note (Signed)
Transition of Care [facility]) - Initial/Assessment Note    Patient Details  Name: Carol Hardin MRN: [id number] Date of Birth: 1933/01/13  Transition of Care [facility]) CM/SW Contact:    Bethena Roys, RN Phone Number: 01/23/2019, 4:17 PM  Clinical Narrative: Pt presented for cough and shortness of breath. PTA independent from home with spouse. Pt has PCP and uses Electronics engineer. Pt is active with [facility] for chronic disease management services. Patient had services from [facility] prior to hospitalization. Services were stopped via patient due to COVID-19 and she did not want people in her home. Post pacemaker- Pt will have PT/OT to consult- she wants to know exercises she can do at home. Unsure if she will be willing to allow [place] to come back in at this time. CM will continue to monitor for additional needs.                Expected Discharge Plan: [place] Barriers to Discharge: Continued Medical Work up   Patient Goals and CMS Choice        Expected Discharge Plan and Services Expected Discharge Plan: [place] In-house Referral: [facility](Active with [facility]) Discharge Planning Services: CM Consult   Living arrangements for the past 2 months: Single Family Home                          Prior Living Arrangements/Services Living arrangements for the past 2 months: Single Family Home Lives with:: Spouse Patient language and need for interpreter reviewed:: Yes Do you feel safe going back to the place where you live?: Yes      Need for Family Participation in Patient Care: Yes (Comment) Care giver support system in place?: Yes (comment) Current home services: DME(DME oxygen 2 LIters via [place]. ) Criminal Activity/Legal Involvement Pertinent to Current Situation/Hospitalization: No - Comment as needed  Activities of Daily Living Home Assistive Devices/Equipment: Blood pressure cuff,  Oxygen, Eyeglasses, Walker (specify type), Cane (specify quad or straight), Shower chair with back, Scales(Oxygen used PRN 2L) ADL Screening (condition at time of admission) Patient's cognitive ability adequate to safely complete daily activities?: Yes Is the patient deaf or have difficulty hearing?: No Does the patient have difficulty seeing, even when wearing glasses/contacts?: No Does the patient have difficulty concentrating, remembering, or making decisions?: No Patient able to express need for assistance with ADLs?: Yes Does the patient have difficulty dressing or bathing?: No Independently performs ADLs?: No Communication: Independent Dressing (OT): Needs assistance Is this a change from baseline?: Change from baseline, expected to last <3days Grooming: Needs assistance Is this a change from baseline?: Change from baseline, expected to last <3 days Feeding: Independent Bathing: Needs assistance Is this a change from baseline?: Change from baseline, expected to last <3 days Toileting: Needs assistance Is this a change from baseline?: Change from baseline, expected to last <3 days In/Out Bed: Needs assistance Is this a change from baseline?: Change from baseline, expected to last <3 days Walks in Home: Independent Does the patient have difficulty walking or climbing stairs?: Yes Weakness of Legs: Both Weakness of Arms/Hands: None  Permission Sought/Granted Permission sought to share information with : Family Supports                Emotional Assessment Appearance:: Appears stated age Attitude/Demeanor/Rapport: Engaged Affect (typically observed): Accepting Orientation: : Oriented to Self, Oriented to Place, Oriented to  Time, Oriented  to Situation Alcohol / Substance Use: Not Applicable Psych Involvement: No (comment)  Admission diagnosis:  Atrial fibrillation with RVR (HCC) [I48.91] Patient Active Problem List   Diagnosis Date Noted  . Sick sinus syndrome ([place])  01/22/2019  . CKD (chronic kidney disease) stage 3, GFR 30-59 ml/min (HCC) 01/21/2019  . Atrial fibrillation with RVR ([place]) 01/20/2019  . Acute on chronic systolic CHF (congestive heart failure) ([place]) 12/25/2018  . Tachy-brady syndrome ([place]) 12/25/2018  . Atrial fibrillation, chronic 12/24/2018  . Congestive heart failure with left ventricular diastolic dysfunction, unspecified failure chronicity ([place]) 12/24/2018  . Malnutrition of moderate degree 11/20/2018  . Atrial fibrillation with rapid ventricular response ([place]) 11/18/2018  . GERD (gastroesophageal reflux disease) 11/18/2018  . CAP (community acquired pneumonia) 11/18/2018  . Acute on chronic diastolic HF (heart failure) ([place]) 11/18/2018  . Stroke (cerebrum) ([place]) 05/19/2018  . Hip fracture ([place]) 12/31/2017  . PAF (paroxysmal atrial fibrillation) ([place])   . NSVT (nonsustained ventricular tachycardia) ([place])   . Mitral regurgitation   . Frequent PVCs   . Bradycardia   . Essential hypertension   . Hypokalemia   . Diverticulitis 08/09/2016  . Diverticulosis of colon 04/22/2016  . Diverticulosis of large intestine without perforation or abscess without bleeding 04/22/2016  . Generalized anxiety disorder 04/22/2016  . Irritable bowel syndrome without diarrhea 04/22/2016  . Mitral valve prolapse 04/22/2016  . Mixed hyperlipidemia 04/22/2016  . Osteopenia 04/22/2016  . Low blood pressure 09/24/2014  . Hypercholesterolemia   . History of depression   . Scoliosis   . Osteoporosis   . DYSPNEA 05/31/2008   PCP:  Celene Squibb, MD Pharmacy:   [facility], [place] [facility] [place] [address] [place] [address] Phone: [phone] Fax: [phone]     Social Determinants of Health (SDOH) Interventions    Readmission Risk Interventions Readmission Risk Prevention Plan 12/29/2018  Transportation Screening Complete  PCP or Specialist Appt within 3-5 Days  Complete  HRI or [place] Complete  Social Work Consult for [place] Planning/Counseling Complete  Palliative Care Screening Not Applicable  Medication Review Press photographer) Complete  Some recent data might be hidden

## 2019-01-24 ENCOUNTER — Encounter (HOSPITAL_COMMUNITY): Payer: Self-pay | Admitting: Internal Medicine

## 2019-01-24 ENCOUNTER — Inpatient Hospital Stay (HOSPITAL_COMMUNITY): Payer: Medicare Other

## 2019-01-24 DIAGNOSIS — Z9981 Dependence on supplemental oxygen: Secondary | ICD-10-CM

## 2019-01-24 DIAGNOSIS — Z950 Presence of cardiac pacemaker: Secondary | ICD-10-CM

## 2019-01-24 DIAGNOSIS — J9611 Chronic respiratory failure with hypoxia: Secondary | ICD-10-CM

## 2019-01-24 DIAGNOSIS — Z959 Presence of cardiac and vascular implant and graft, unspecified: Secondary | ICD-10-CM

## 2019-01-24 LAB — BASIC METABOLIC PANEL
Anion gap: 12 (ref 5–15)
BUN: 18 mg/dL (ref 8–23)
CO2: 22 mmol/L (ref 22–32)
Calcium: 9.2 mg/dL (ref 8.9–10.3)
Chloride: 103 mmol/L (ref 98–111)
Creatinine, Ser: 1.21 mg/dL — ABNORMAL HIGH (ref 0.44–1.00)
GFR calc Af Amer: 48 mL/min — ABNORMAL LOW (ref >60)
GFR calc non Af Amer: 41 mL/min — ABNORMAL LOW (ref >60)
Glucose, Bld: 111 mg/dL — ABNORMAL HIGH (ref 70–99)
Potassium: 3.9 mmol/L (ref 3.5–5.1)
Sodium: 137 mmol/L (ref 135–145)

## 2019-01-24 LAB — CBC
HCT: 39.4 % (ref 36.0–46.0)
Hemoglobin: 13 g/dL (ref 12.0–15.0)
MCH: 31.9 pg (ref 26.0–34.0)
MCHC: 33 g/dL (ref 30.0–36.0)
MCV: 96.8 fL (ref 80.0–100.0)
Platelets: 171 10*3/uL (ref 150–400)
RBC: 4.07 MIL/uL (ref 3.87–5.11)
RDW: 15 % (ref 11.5–15.5)
WBC: 8.7 10*3/uL (ref 4.0–10.5)
nRBC: 0 % (ref 0.0–0.2)

## 2019-01-24 LAB — POCT ACTIVATED CLOTTING TIME: Activated Clotting Time: 164 seconds

## 2019-01-24 MED ORDER — APIXABAN 5 MG PO TABS: 5.0000 mg | ORAL_TABLET | Freq: Two times a day (BID) | ORAL | 6 refills | Status: DC

## 2019-01-24 MED FILL — Lidocaine HCl Local Inj 1%: INTRAMUSCULAR | Qty: 60 | Status: AC

## 2019-01-24 NOTE — Discharge Summary (Signed)
Discharge Summary  Carol Hardin YBO:[id number] DOB: Mar 14, 1933  PCP: Celene Squibb, MD  Admit date: 01/20/2019 Discharge date: 01/24/2019  Time spent: 53mins, more than 50% time spent on coordination of care.  Recommendations for Outpatient Follow-up:  1. F/u with PCP within a week  for hospital discharge follow up, repeat cbc/bmp at follow up 2. F/u with cardiology and EP as scheduled 3. Home health  4. Continue home 02 2liters  Discharge Diagnoses:  Active Hospital Problems   Diagnosis Date Noted  . Atrial fibrillation with RVR ([place]) 01/20/2019  . Sick sinus syndrome ([place]) 01/22/2019  . CKD (chronic kidney disease) stage 3, GFR 30-59 ml/min (HCC) 01/21/2019  . Acute on chronic systolic CHF (congestive heart failure) ([place]) 12/25/2018  . Tachy-brady syndrome ([place]) 12/25/2018  . Atrial fibrillation, chronic 12/24/2018  . GERD (gastroesophageal reflux disease) 11/18/2018  . Essential hypertension   . Hypokalemia   . Generalized anxiety disorder 04/22/2016    Resolved Hospital Problems  No resolved problems to display.    Discharge Condition: stable  Diet recommendation: heart healthy  Filed Weights   01/21/19 1226 01/22/19 0656 01/24/19 0450  Weight: 61.3 kg 60.2 kg 62 kg    History of present illness: (per admitting MD Dr Nehemiah Settle) PCP: Celene Squibb, MD  Outpatient Specialists: Dr branch (cardiology)  Patient Coming From: home  Chief Complaint: SOB  HPI: Carol Hardin is a 85 y.o. female with a history of paroxysmal atrial fibrillation on amiodarone, GERD, hypertension, mitral valve prolapse, combined systolic and diastolic heart failure.  Patient seen for 3 to 4 days of worsening shortness of breath that is worse with lying down flat and exertion.  Symptoms are improved with sitting at side of the bed and rest from exertion.  She has been on amiodarone since her last hospitalization for this in February, however she reports side effects to amiodarone.   This was decreased to 200 g once a day on 4/3 during a telehealth visit with Dr. Harl Bowie.  She reports continuing to have side effects.  No other palliating or provoking factors.  Denies fevers, chills, nausea, vomiting.  Does have some tightness in the chest.  Positive orthopnea.  Emergency Department Course: Patient came in with A. fib with RVR with heart rate in the 140s to 150s.  Patient started on Cardizem drip with good results.  Hospital Course:  Principal Problem:   Atrial fibrillation with RVR (HCC) Active Problems:   Generalized anxiety disorder   Essential hypertension   Hypokalemia   GERD (gastroesophageal reflux disease)   Atrial fibrillation, chronic   Acute on chronic systolic CHF (congestive heart failure) (HCC)   Tachy-brady syndrome (HCC)   CKD (chronic kidney disease) stage 3, GFR 30-59 ml/min (HCC)   Sick sinus syndrome (HCC)    Acute on chronic systolic CHF with chronic hypoxic respiratory failure on home o2 2liters - - 3/20 discharge weight 137.3 pounds (62.2 kg) - 11/19/2018 echocardiogram:-EF 40- 55%, diffuse hypokinesis, moderate decreased RV function, moderate MR with mild myxomatous -she received iv lasix in the hospital, Patient appears euvolemic -improved, she is discharged on home does lasix -f/u with cardiology as scheduled  Hyperbilirubinemia/ mild elevated lft on presentation --lft improved at discharge, likely combination of liver congestion in the setting of heart failure and the use of amiodarone (amiodarone discontinued_   Tachybradycardia syndrome (sick sinus syndrome) with atrial fibrillation(chronic) -3/14 s/p placement of PIV pacemaker, AV node ablation -she does not need rate control meds after av nodal  ablation, she is pace maker dependent -resume eliquis from Friday pm -she is to follow up with cardiology and EP as scheduled   Hypokalemia/hypomagnesemia: Replaced and normalized.  CKDIII -cr at baseline, renal dosing meds   Generalized anxiety disorder Stable on home meds busirone 5mg  bid prn and ativan 1mg  qhs   Procedures:  Av nodal ablation and pacemaker placement  Consultations:  Cardiology/EP  Discharge Exam: BP 94/74 (BP Location: Right Arm)   Pulse 81   Temp 97.7 F (36.5 C) (Oral)   Resp (!) 28   Ht 5\' 5"  (1.651 m)   Wt 62 kg   SpO2 100%   BMI 22.75 kg/m   General: NAD Cardiovascular: paced rhythm Respiratory: CTABL Extremity: no edema  Discharge Instructions You were cared for by a hospitalist during your hospital stay. If you have any questions about your discharge medications or the care you received while you were in the hospital after you are discharged, you can call the unit and asked to speak with the hospitalist on call if the hospitalist that took care of you is not available. Once you are discharged, your primary care physician will handle any further medical issues. Please note that NO REFILLS for any discharge medications will be authorized once you are discharged, as it is imperative that you return to your primary care physician (or establish a relationship with a primary care physician if you do not have one) for your aftercare needs so that they can reassess your need for medications and monitor your lab values.  Discharge Instructions    Diet - low sodium heart healthy   Complete by:  As directed    Increase activity slowly   Complete by:  As directed      Allergies as of 01/24/2019      Reactions   Omeprazole Other (See Comments)   siezure   Flagyl [metronidazole] Nausea And Vomiting   Aripiprazole Other (See Comments)   Unknown reaction   Hylan G-f 20 Other (See Comments)   Unknown reaction   Paroxetine Hcl Other (See Comments)   Headaches (a long time ago- pt doesn't really remember)   Statins Other (See Comments)   No specific reaction given-patient states that she was advised not to take by physician   Sulfa Antibiotics Diarrhea, Other (See Comments)    Colitis   Toprol Xl [metoprolol Succinate] Other (See Comments)   Dizziness--pt doesn't really remember    Vancomycin Itching, Other (See Comments)   Pt reports that they gave it too fast- she started having itching in the scalp   Penicillins Rash   DID THE REACTION INVOLVE: Swelling of the face/tongue/throat, SOB, or low BP? No Sudden or severe rash/hives, skin peeling, or the inside of the mouth or nose? Yes Did it require medical treatment? Unknown When did it last happen?Over 10 years If all above answers are "NO", may proceed with cephalosporin use.      Medication List    STOP taking these medications   amiodarone 200 MG tablet Commonly known as:  PACERONE   azithromycin 250 MG tablet Commonly known as:  ZITHROMAX   diltiazem 30 MG tablet Commonly known as:  CARDIZEM     TAKE these medications   acetaminophen 325 MG tablet Commonly known as:  TYLENOL Take 2 tablets (650 mg total) by mouth every 4 (four) hours as needed for headache or mild pain.   apixaban 5 MG Tabs tablet Commonly known as:  ELIQUIS Take 1 tablet (5 mg  total) by mouth 2 (two) times daily. Please hold until Friday afternoon Start taking on:  January 26, 2019 What changed:    additional instructions  These instructions start on January 26, 2019. If you are unsure what to do until then, ask your doctor or other care provider.   busPIRone 5 MG tablet Commonly known as:  BUSPAR Take 5 mg by mouth 3 (three) times daily as needed (anxiety).   famotidine 20 MG tablet Commonly known as:  PEPCID Take 20 mg by mouth daily.   furosemide 20 MG tablet Commonly known as:  LASIX Take 1 tablet (20 mg total) by mouth every Tuesday, Thursday, Saturday, and Sunday.   guaiFENesin 600 MG 12 hr tablet Commonly known as:  MUCINEX Take 600 mg by mouth as needed for cough. Takes PRN for congested cough. Purchased OTC. Has been taking daily around lunch.   LORazepam 1 MG tablet Commonly known as:  ATIVAN Take  1 mg by mouth at bedtime.   Mederma AG Hand & Body Lotn Apply 1 application topically daily as needed.   polyethylene glycol 17 g packet Commonly known as:  MIRALAX / GLYCOLAX Take 17 g by mouth daily as needed for mild constipation.      Allergies  Allergen Reactions  . Omeprazole Other (See Comments)    siezure  . Flagyl [Metronidazole] Nausea And Vomiting  . Aripiprazole Other (See Comments)    Unknown reaction  . Hylan G-F 20 Other (See Comments)    Unknown reaction  . Paroxetine Hcl Other (See Comments)    Headaches (a long time ago- pt doesn't really remember)  . Statins Other (See Comments)    No specific reaction given-patient states that she was advised not to take by physician  . Sulfa Antibiotics Diarrhea and Other (See Comments)    Colitis   . Toprol Xl [Metoprolol Succinate] Other (See Comments)    Dizziness--pt doesn't really remember   . Vancomycin Itching and Other (See Comments)    Pt reports that they gave it too fast- she started having itching in the scalp  . Penicillins Rash    DID THE REACTION INVOLVE: Swelling of the face/tongue/throat, SOB, or low BP? No Sudden or severe rash/hives, skin peeling, or the inside of the mouth or nose? Yes Did it require medical treatment? Unknown When did it last happen?Over 10 years If all above answers are "NO", may proceed with cephalosporin use.    Follow-up Information    [facility] Follow up.   Specialty:  Cardiology Why:  02/05/2019 @ 10:30AM, wound check visit Contact information: [address] [place]       [facility] Follow up.   Specialty:  Cardiology Why:  02/19/2019 @ 10:30AM, routine pacemaker programming visit Contact information: [address], [place] [phone]       Thompson Grayer, MD Follow up.   Specialty:  Cardiology Why:  05/07/2019 @ 2:30PM  Contact information: [place] [address] [phone]        Celene Squibb, MD Follow up.   Specialty:  Internal Medicine Contact information: [place] [facility] [address] [phone]        Arnoldo Lenis, MD .   Specialty:  Cardiology Contact information: [place] [address] [place] [facility]  Follow up.   Specialty:  Home Health Services Why:  Physical and Occupational Therapy- office to call the patient and scheule time for visit.  Contact information: [address] [place] [address] [phone]            The results of significant diagnostics from this hospitalization (including imaging, microbiology, ancillary and laboratory) are listed below for reference.    Significant Diagnostic Studies: Dg Chest 2 View  Result Date: 01/24/2019 CLINICAL DATA:  Status post defibrillator placement. EXAM: CHEST - 2 VIEW COMPARISON:  Radiograph of January 20, 2019. FINDINGS: Stable cardiomegaly. Interval placement of left-sided pacemaker with leads in grossly good position. No pneumothorax is noted. Right lung is clear. Probable mild left basilar subsegmental atelectasis is noted with possible pleural effusion. Bony thorax is unremarkable. IMPRESSION: Interval placement of left-sided pacemaker with leads in grossly good position. Stable cardiomegaly. Probable left basilar atelectasis is noted with possible pleural effusion. No pneumothorax is noted. Electronically Signed   By: Marijo Conception M.D.   On: 01/24/2019 07:20   Dg Chest 2 View  Result Date: 01/20/2019 CLINICAL DATA:  Recent pneumonia/bronchitis treated with antibiotics. History of atrial fibrillation. EXAM: CHEST - 2 VIEW COMPARISON:  Radiographs 12/24/2018 and 11/20/2018. FINDINGS: 1756 hours. Stable cardiomegaly and aortic atherosclerosis. There are small left-greater-than-right pleural effusions. There are  diffuse interstitial opacities in both lungs which are similar to the most recent study, probably reflecting edema. There is no confluent airspace opacity or pneumothorax. The bones appear unchanged. Multiple telemetry leads overlie the chest. IMPRESSION: Overall, little change is seen from the most recent study 4 weeks ago. There is cardiomegaly with diffuse interstitial opacities and left-greater-than-right pleural effusions, most consistent with congestive heart failure or atypical infection. No confluent airspace opacity. Electronically Signed   By: Richardean Sale M.D.   On: 01/20/2019 18:42    Microbiology: Recent Results (from the past 240 hour(s))  Surgical PCR screen     Status: None   Collection Time: 01/22/19  1:33 PM  Result Value Ref Range Status   MRSA, PCR NEGATIVE NEGATIVE Final   Staphylococcus aureus NEGATIVE NEGATIVE Final    Comment: (NOTE) The Xpert SA Assay (FDA approved for NASAL specimens in patients 30 years of age and older), is one component of a comprehensive surveillance program. It is not intended to diagnose infection nor to guide or monitor treatment. Performed at [facility], [place] [facility] [address] [place], [place] [address]      Labs: Basic Metabolic Panel: Recent Labs  Lab 01/20/19 1637 01/21/19 0409 01/22/19 0419 01/23/19 0631 01/24/19 0534  NA 138 141 140 138 137  K 3.6 3.4* 3.2* 4.1 3.9  CL 103 104 102 103 103  CO2 22 26 26 24 22   GLUCOSE 132* 83 96 90 111*  BUN 22 21 17 17 18   CREATININE 1.31* 1.30* 1.60* 1.28* 1.21*  CALCIUM 9.5 8.6* 8.7* 9.1 9.2  MG  --   --  1.8 2.3  --    Liver Function Tests: Recent Labs  Lab 01/20/19 1637 01/21/19 0409 01/22/19 0419  AST 56* 34 32  ALT 47* 33 31  ALKPHOS 66 46 51  BILITOT 2.5* 1.8* 1.6*  PROT 8.0 5.7* 5.8*  ALBUMIN 4.7 3.4* 3.3*   No results for input(s): LIPASE, AMYLASE in the last 168 hours. No results for input(s): AMMONIA in the last 168 hours. CBC: Recent Labs  Lab 01/20/19  1637 01/23/19 0631 01/24/19 0534  WBC 7.5 7.1  8.7  NEUTROABS 4.9  --   --   HGB 15.4* 13.2 13.0  HCT 47.5* 41.5 39.4  MCV 98.8 97.2 96.8  PLT 203 179 171   Cardiac Enzymes: Recent Labs  Lab 01/20/19 1637  TROPONINI <0.03   BNP: BNP (last 3 results) Recent Labs    11/18/18 1256 12/24/18 1020 01/20/19 1637  BNP 942.0* 1,248.0* 1,638.0*    ProBNP (last 3 results) No results for input(s): PROBNP in the last 8760 hours.  CBG: No results for input(s): GLUCAP in the last 168 hours.     Signed:  Florencia Reasons MD, PhD  [facility] 01/24/2019, 12:18 PM

## 2019-01-24 NOTE — TOC Transition Note (Signed)
Transition of Care [facility]) - CM/SW Discharge Note   Patient Details  Name: Carol Hardin MRN: [id number] Date of Birth: [birth year]  Transition [facility] Care [facility]) CM/SW Contact:  Bethena Roys, RN Phone Number: 01/24/2019, 11:57 AM   Clinical Narrative:  Plan for transition home today. Pt was previously active with [facility]. Pt is willing to have [facility] via [facility] if the staff wear mask. Referral was made and CM did make Liaison aware of request. SOC to begin withen 24-48 hours post transition home. No further home needs identified.     Final next level of care: [place] Barriers to Discharge: Continued Medical Work up   Patient Goals and CMS Choice Patient states their goals for this hospitalization and ongoing recovery are:: "To feel better" CMS Medicare.gov Compare Post Acute Care list provided to:: Patient Choice offered to / list presented to : Patient   Discharge Plan and Services In-house Referral: [facility](Active with [facility]) Discharge Planning Services: CM Consult Post Acute Care Choice: Home Health          DME Arranged: N/A DME Agency: NA HH Arranged: PT, OT [place] Agency: [facility] (formerly [employer])   Social Determinants of Health (SDOH) Interventions     Readmission Risk Interventions Readmission Risk Prevention Plan 12/29/2018  Transportation Screening Complete  PCP or Specialist Appt within 3-5 Days Complete  HRI or [place] Complete  Social Work Consult for [place] Planning/Counseling Complete  Palliative Care Screening Not Applicable  Medication Review Press photographer) Complete  Some recent data might be hidden

## 2019-01-24 NOTE — Evaluation (Signed)
Occupational Therapy Evaluation Patient Details Name: Carol Hardin MRN: [id number] DOB: 05-12-1933 Today's Date: 01/24/2019    History of Present Illness 85-year WF PMHx, depression, generalized anxiety DO, cardiomyopathy (EF 40%) tachybradycardia syndrome,Chronic atrial fibrillation,  NSVT, MVP HLD, CKD stage III. Presenting with 3-4 day history of nonproductive cough and shortness of breath. s/p pacemaker placement 01/23/2019   Clinical Impression   Patient is s/p pacemaker surgery resulting in functional limitations due to the deficits listed below (see OT problem list). Pt with L UE restrictions for 6 weeks and min (A) for balance at times with adls. Pt fall risk at this time. Pt agreeable to home health only if the provider wears a mask.  Patient will benefit from skilled OT acutely to increase independence and safety with ADLS to allow discharge [place].     Follow Up Recommendations  Home health OT    Equipment Recommendations  None recommended by OT    Recommendations for Other Services       Precautions / Restrictions Precautions Precautions: ICD/Pacemaker Precaution Comments: reviewed pacemaker precautions- provided handout and made patient repeat information back to therapist Required Braces or Orthoses: Sling(to reduce urge for shoulder flexion ) Restrictions Weight Bearing Restrictions: Yes Other Position/Activity Restrictions: pacemaker precautions      Mobility Bed Mobility Overal bed mobility: Needs Assistance Bed Mobility: Supine to Sit     Supine to sit: Min guard     General bed mobility comments: min guard for safety, vc for scooting hips forward on bed without use of L UE  Transfers Overall transfer level: Needs assistance Equipment used: Straight cane Transfers: Sit to/from Stand Sit to Stand: Min guard         General transfer comment: pt able to power up into standing but reaching with L UE    Balance Overall balance assessment: Needs  assistance Sitting-balance support: No upper extremity supported;Feet supported Sitting balance-Leahy Scale: Fair     Standing balance support: Single extremity supported Standing balance-Leahy Scale: Poor Standing balance comment: requires UE support to maintain balance                           ADL either performed or assessed with clinical judgement   ADL Overall ADL's : Needs assistance/impaired Eating/Feeding: Independent   Grooming: Wash/dry hands;Wash/dry face;Modified independent   Upper Body Bathing: Supervision/ safety   Lower Body Bathing: Control and instrumentation engineer Transfer: Min guard           Functional mobility during ADLs: Min guard General ADL Comments: pt completed x4 steps this session with OT due to pending d/c today. pt requires one step at a time with bil feet on the step before progressing to the next step. pt winded at the top of steps. pt encouraged to have a chair near the top of steps to rest prior to entry into the house. BP taken and remains with MAP 87 but soft. pt reports its always low.      Vision Baseline Vision/History: Wears glasses Wears Glasses: Reading only       Perception     Praxis      Pertinent Vitals/Pain Pain Assessment: Faces Faces Pain Scale: Hurts little more Pain Location: L shoulder Pain Descriptors / Indicators: Aching;Sore Pain Intervention(s): Monitored during session;Premedicated before session;Repositioned     Hand Dominance Right   Extremity/Trunk Assessment Upper Extremity Assessment Upper Extremity Assessment: LUE deficits/detail  LUE Deficits / Details: pacemaker restrictions. otherwise [facility]   Lower Extremity Assessment Lower Extremity Assessment: Defer to PT evaluation   Cervical / Trunk Assessment Cervical / Trunk Assessment: Kyphotic   Communication Communication Communication: No difficulties   Cognition Arousal/Alertness: Awake/alert Behavior During Therapy: WFL for  tasks assessed/performed;Anxious Overall Cognitive Status: No family/caregiver present to determine baseline cognitive functioning Area of Impairment: Attention;Awareness;Following commands;Memory                   Current Attention Level: Selective Memory: Decreased recall of precautions;Decreased short-term memory Following Commands: Follows multi-step commands with increased time   Awareness: Emergent   General Comments: Pt reports feeling fatigued. pt with not recall of L UE precautions s/p pacemaker and PT session just prior to OT. pt agreeable to home health as long as they wear a mask   General Comments  Oxygen 89%  2 L [place] and map BP 87    Exercises     Shoulder Instructions      Home Living Family/patient expects to be discharged to:: Private residence Living Arrangements: Spouse/significant other Available Help at Discharge: Family;Available 24 hours/day Type of Home: House Home Access: Stairs to enter [employer] of Steps: 6   Home Layout: One level     Bathroom Shower/Tub: Occupational psychologist: Standard Bathroom Accessibility: Yes   Home Equipment: Environmental consultant - 2 wheels;Shower seat;Cane - single point   Additional Comments: spouse only to (A)      Prior Functioning/Environment Level of Independence: Independent with assistive device(s)        Comments: household and short distanced community ambulator with [facility]        OT Problem List: Decreased strength;Decreased range of motion;Decreased activity tolerance;Impaired balance (sitting and/or standing);Decreased coordination;Decreased cognition;Decreased safety awareness;Decreased knowledge of use of DME or AE;Decreased knowledge of precautions;Cardiopulmonary status limiting activity;Impaired UE functional use      OT Treatment/Interventions: Self-care/ADL training;Therapeutic exercise;Neuromuscular education;Energy conservation;DME and/or AE instruction;Manual  therapy;Therapeutic activities;Cognitive remediation/compensation;Patient/family education;Balance training    OT Goals(Current goals can be found in the care plan section) Acute Rehab OT Goals Patient Stated Goal: someone to call my husband OT Goal Formulation: With patient Time For Goal Achievement: 02/07/19 Potential to Achieve Goals: Good  OT Frequency: Min 3X/week   Barriers to D/C: Other (comment)(unknown spouse ability to (A))          Co-evaluation              AM-PAC OT "6 Clicks" Daily Activity     Outcome Measure Help from another person eating meals?: None Help from another person taking care of personal grooming?: A Little Help from another person toileting, which includes using toliet, bedpan, or urinal?: A Little Help from another person bathing (including washing, rinsing, drying)?: A Little Help from another person to put on and taking off regular upper body clothing?: A Little Help from another person to put on and taking off regular lower body clothing?: A Lot 6 Click Score: 18   End of Session Equipment Utilized During Treatment: Oxygen Nurse Communication: Mobility status;Precautions  Activity Tolerance: Patient tolerated treatment well Patient left: in bed;with call bell/phone within reach;with bed alarm set  OT Visit Diagnosis: Unsteadiness on feet (R26.81);Muscle weakness (generalized) (M62.81)                Time: 0350-0938 OT Time Calculation (min): 29 min Charges:  OT General Charges $OT Visit: 1 Visit OT Evaluation $OT Eval Moderate Complexity: 1 Mod OT Treatments $  Therapeutic Activity: 8-22 mins   Jeri Modena, OTR/L  Acute Rehabilitation Services Pager: [phone] Office: [phone] .   Jeri Modena 01/24/2019, 1:45 PM

## 2019-01-24 NOTE — Progress Notes (Signed)
Progress Note  Patient Name: Carol Hardin Date of Encounter: 01/24/2019  Primary Cardiologist: Carlyle Dolly, MD   Subjective   Denies any CP or SOB, not much of an appetite.  Denies pain at groin site, mild discomfort at pacer site only.  "heart racing" is resolved.  Inpatient Medications    Scheduled Meds:  famotidine  20 mg Oral Daily   LORazepam  1 mg Oral QHS   sodium chloride flush  3 mL Intravenous Q12H   Continuous Infusions:  sodium chloride     PRN Meds: sodium chloride, acetaminophen, busPIRone, HYDROcodone-acetaminophen, ondansetron (ZOFRAN) IV, sodium chloride flush   Vital Signs    Vitals:   01/23/19 2012 01/23/19 2030 01/24/19 0441 01/24/19 0450  BP: 95/75 (!) 88/74 91/64   Pulse:  80 82   Resp:  20 18   Temp:  97.7 F (36.5 C) 97.7 F (36.5 C)   TempSrc:  Oral Oral   SpO2:  96% 94%   Weight:    62 kg  Height:        Intake/Output Summary (Last 24 hours) at 01/24/2019 0738 Last data filed at 01/24/2019 0300 Gross per 24 hour  Intake 222 ml  Output 200 ml  Net 22 ml   Last 3 Weights 01/24/2019 01/22/2019 01/21/2019  Weight (lbs) 136 lb 11 oz 132 lb 12.8 oz 135 lb 1.6 oz  Weight (kg) 62 kg 60.238 kg 61.281 kg  Some encounter information is confidential and restricted. Go to Review Flowsheets activity to see all data.      Telemetry    V paced - Personally Reviewed,  Occasional PVCs  ECG    V paced - Personally Reviewed  Physical Exam   GEN: No acute distress.   Neck: No JVD Cardiac: RRR  Respiratory: normal work of breathing GI: Soft, nontender, non-distended  MS: No edema; No deformity. Neuro:  Nonfocal  Psych: Normal affect   R groin site:  soft, no hematoma, no bleeding, no bruit PPM site (L chest):  no bleeding, no hematoma  Labs    Chemistry Recent Labs  Lab 01/20/19 1637 01/21/19 0409 01/22/19 0419 01/23/19 0631 01/24/19 0534  NA 138 141 140 138 137  K 3.6 3.4* 3.2* 4.1 3.9  CL 103 104 102 103 103   CO2 22 26 26 24 22   GLUCOSE 132* 83 96 90 111*  BUN 22 21 17 17 18   CREATININE 1.31* 1.30* 1.60* 1.28* 1.21*  CALCIUM 9.5 8.6* 8.7* 9.1 9.2  PROT 8.0 5.7* 5.8*  --   --   ALBUMIN 4.7 3.4* 3.3*  --   --   AST 56* 34 32  --   --   ALT 47* 33 31  --   --   ALKPHOS 66 46 51  --   --   BILITOT 2.5* 1.8* 1.6*  --   --   GFRNONAA 38* 38* 29* 39* 41*  GFRAA 44* 44* 34* 45* 48*  ANIONGAP 13 11 12 11 12      Hematology Recent Labs  Lab 01/20/19 1637 01/23/19 0631 01/24/19 0534  WBC 7.5 7.1 8.7  RBC 4.81 4.27 4.07  HGB 15.4* 13.2 13.0  HCT 47.5* 41.5 39.4  MCV 98.8 97.2 96.8  MCH 32.0 30.9 31.9  MCHC 32.4 31.8 33.0  RDW 14.7 14.6 15.0  PLT 203 179 171    Cardiac Enzymes Recent Labs  Lab 01/20/19 1637  TROPONINI <0.03   No results for input(s): TROPIPOC in the last  168 hours.   BNP Recent Labs  Lab 01/20/19 1637  BNP 1,638.0*     DDimer No results for input(s): DDIMER in the last 168 hours.   Radiology    Dg Chest 2 View Result Date: 01/24/2019 CLINICAL DATA:  Status post defibrillator placement. EXAM: CHEST - 2 VIEW COMPARISON:  Radiograph of January 20, 2019. FINDINGS: Stable cardiomegaly. Interval placement of left-sided pacemaker with leads in grossly good position. No pneumothorax is noted. Right lung is clear. Probable mild left basilar subsegmental atelectasis is noted with possible pleural effusion. Bony thorax is unremarkable. IMPRESSION: Interval placement of left-sided pacemaker with leads in grossly good position. Stable cardiomegaly. Probable left basilar atelectasis is noted with possible pleural effusion. No pneumothorax is noted. Electronically Signed   By: Marijo Conception M.D.   On: 01/24/2019 07:20    Cardiac Studies   11/19/2018: TTE IMPRESSIONS  1. The left ventricle has mild-moderately reduced systolic function of 93-23%. The cavity size was normal. There is concentric left ventricular hypertrophy. Left ventricular diastology could not be evaluated  secondary to atrial fibrillation. Elevated  left ventricular end-diastolic pressure Left ventricular diffuse hypokinesis.  2. The right ventricle has moderately reduced systolic function. The cavity was mildly enlarged. There is no increase in right ventricular wall thickness.  3. Left atrial size was mildly dilated.  4. Right atrial size was moderately dilated.  5. Moderate pleural effusion in the left lateral region.  6. The mitral valve is myxomatous. There is mild thickening. Mitral valve regurgitation is moderate by color flow Doppler.  7. The tricuspid valve is normal in structure.  8. The aortic valve is tricuspid Aortic valve regurgitation is mild by color flow Doppler.  9. There is dilatation of the aortic root. 10. The inferior vena cava was dilated in size with >50% respiratory variability.  FINDINGS  Left Ventricle: The left ventricle has mild-moderately reduced systolic function of 55-73%. The cavity size was normal. There is concentric left ventricular hypertrophy. Left ventricular diastology could not be evaluated secondary to atrial  fibrillation. Elevated left ventricular end-diastolic pressure Left ventricular diffuse hypokinesis. Right Ventricle: The right ventricle has moderately reduced systolic function. The cavity was mildly enlarged. There is no increase in right ventricular wall thickness. Left Atrium: left atrial size was mildly dilated Right Atrium: right atrial size was moderately dilated Interatrial Septum: No atrial level shunt detected by color flow Doppler.  Pericardium: There is no evidence of pericardial effusion. There is a moderate pleural effusion in the left lateral region. Mitral Valve: The mitral valve is myxomatous. There is mild thickening. Mitral valve regurgitation is moderate by color flow Doppler. Tricuspid Valve: The tricuspid valve is normal in structure. Tricuspid valve regurgitation is mild by color flow Doppler. Aortic Valve: The aortic  valve is tricuspid Aortic valve regurgitation is mild by color flow Doppler. Pulmonic Valve: The pulmonic valve was grossly normal. Pulmonic valve regurgitation is not visualized by color flow Doppler. Aorta: There is dilatation of the aortic root. Venous: The inferior vena cava is dilated in size with greater than 50% respiratory variability.     Patient Profile     85 y.o. female with a history of persistent AF, tachy/brady syndrome, CHF, anxiety presenting with AF with RVR and SOB.  Now s/p CRT-P and AV nodal ablation 01/23/2019.  Assessment & Plan    1.  Persistent afib with RVR      CHA2DS2Vasc is at least 4, on Eliquis, appropriately dosed >> held for [facility] implant  V rates are now controlled post AV nodal ablation Ok to resume eliquis in 48 hours (Friday pm)  2. S/p AV nodal ablation CXR this AM is without ptx Leads are stable the patient is instructed to send a remote PPM transmission once home site is stable wound care and activity restrictions/instructions were discussed with the patient EP follow up is in place   Resume Eliquis on Friday pm       3. Nonischemic CM/ LBBB     (EF 40%) which is likely tachycardia mediated     Rate control should help in fluid management/symptoms     She is fluid negative (no diuretic in 2 days)     BP will be limiting factor in meds  4. Acute renal failure improved  5.  Hypotension      Looks about where she has been      Off all rate limiting, BP meds  Doing well from EP standpoint OK for discharge from my perspective.  Our office will arrange follow-up  Electrophysiology team to see as needed while here. Please call with questions.  Thompson Grayer MD, [facility] 01/24/2019 9:41 AM

## 2019-01-24 NOTE — Evaluation (Signed)
Physical Therapy Evaluation Patient Details Name: Carol Hardin MRN: [id number] DOB: 1933-08-26 Today's Date: 01/24/2019   History of Present Illness  85-year WF PMHx, depression, generalized anxiety DO, cardiomyopathy (EF 40%) tachybradycardia syndrome,Chronic atrial fibrillation,  NSVT, MVP HLD, CKD stage III. Presenting with 3-4 day history of nonproductive cough and shortness of breath. s/p pacemaker placement 01/23/2019  Clinical Impression  PTA pt living with husband in single story home with 6 steps to enter, utilizes cane or RW for mobilization in home. Husband provides minor assist for iADLs. Pt currently limited in safe mobility by L shoulder pain, generalized weakness and decreased endurance, and is anxious about her current situation. Pt is min guard for bed mobility, minA for transfers and min guard for short distance ambulation. PT took HEP to room however pt lacked focus at this time to teach to her. PT feels that HHPT is required for this pt and discussed safety measures that [facility] service would take in light of COVID precautions. Pt refused HHPT s/p last hospitalization due to increased concern about COVID.     Follow Up Recommendations Home health PT;Supervision/Assistance - 24 hour    Equipment Recommendations  None recommended by PT       Precautions / Restrictions Precautions Precautions: ICD/Pacemaker Precaution Comments: reviewed pacemaker precautions Required Braces or Orthoses: Sling(to reduce urge for shoulder flexion ) Restrictions Weight Bearing Restrictions: Yes Other Position/Activity Restrictions: pacemaker precautions      Mobility  Bed Mobility Overal bed mobility: Needs Assistance Bed Mobility: Supine to Sit     Supine to sit: Min guard     General bed mobility comments: min guard for safety, vc for scooting hips forward on bed without use of L UE  Transfers Overall transfer level: Needs assistance Equipment used: Rolling walker (2  wheeled) Transfers: Sit to/from Stand Sit to Stand: Min assist         General transfer comment: minA for powerup and steadying without use of R UE  Ambulation/Gait Ambulation/Gait assistance: Min guard Gait Distance (Feet): 10 Feet Assistive device: Rolling walker (2 wheeled) Gait Pattern/deviations: Step-through pattern;Decreased step length - right;Decreased step length - left;Shuffle;Trunk flexed Gait velocity: slowed Gait velocity interpretation: <1.31 ft/sec, indicative of household ambulator General Gait Details: min guard for safety, slow shuffling gait, no LoB,          Balance Overall balance assessment: Needs assistance Sitting-balance support: No upper extremity supported;Feet supported Sitting balance-Leahy Scale: Fair     Standing balance support: Single extremity supported Standing balance-Leahy Scale: Poor Standing balance comment: requires UE support to maintain balance                             Pertinent Vitals/Pain Pain Assessment: Faces Faces Pain Scale: Hurts little more Pain Location: L shoulder Pain Descriptors / Indicators: Aching;Sore Pain Intervention(s): Limited activity within patient's tolerance;Monitored during session;Premedicated before session;Repositioned    Home Living Family/patient expects to be discharged to:: Private residence Living Arrangements: Spouse/significant other Available Help at Discharge: Family;Available 24 hours/day Type of Home: House Home Access: Stairs to enter   [employer] of Steps: 6 Home Layout: One level Home Equipment: Walker - 2 wheels;Shower seat;Cane - single point      Prior Function Level of Independence: Independent with assistive device(s)         Comments: household and short distanced community ambulator with [facility]     Hand Dominance   Dominant Hand: Right  Extremity/Trunk Assessment   Upper Extremity Assessment Upper Extremity Assessment: Defer to OT  evaluation    Lower Extremity Assessment Lower Extremity Assessment: Generalized weakness    Cervical / Trunk Assessment Cervical / Trunk Assessment: Kyphotic  Communication   Communication: No difficulties  Cognition Arousal/Alertness: Awake/alert Behavior During Therapy: WFL for tasks assessed/performed;Anxious Overall Cognitive Status: No family/caregiver present to determine baseline cognitive functioning Area of Impairment: Attention;Awareness;Following commands                   Current Attention Level: Selective   Following Commands: Follows multi-step commands with increased time   Awareness: Emergent   General Comments: Pt with increased anxiety, and difficulty focusing on task at hand,      General Comments General comments (skin integrity, edema, etc.): BP 87/59, SaO2 on 2L O2, 92%O2, HR 80 bpm        Assessment/Plan    PT Assessment Patient needs continued PT services  PT Problem List Decreased strength;Decreased activity tolerance;Decreased balance;Decreased mobility;Decreased knowledge of precautions;Pain       PT Treatment Interventions DME instruction;Gait training;Stair training;Functional mobility training;Therapeutic activities;Therapeutic exercise;Balance training;Cognitive remediation;Patient/family education    PT Goals (Current goals can be found in the Care Plan section)  Acute Rehab PT Goals Patient Stated Goal: go home PT Goal Formulation: With patient Time For Goal Achievement: 02/07/19 Potential to Achieve Goals: Fair    Frequency Min 3X/week    AM-PAC PT "6 Clicks" Mobility  Outcome Measure Help needed turning from your back to your side while in a flat bed without using bedrails?: None Help needed moving from lying on your back to sitting on the side of a flat bed without using bedrails?: A Little Help needed moving to and from a bed to a chair (including a wheelchair)?: A Little Help needed standing up from a chair using  your arms (e.g., wheelchair or bedside chair)?: A Little Help needed to walk in hospital room?: A Little Help needed climbing 3-5 steps with a railing? : A Little 6 Click Score: 19    End of Session Equipment Utilized During Treatment: Gait belt Activity Tolerance: Patient tolerated treatment well Patient left: in chair;with call bell/phone within reach;with chair alarm set Nurse Communication: Mobility status;Precautions PT Visit Diagnosis: Unsteadiness on feet (R26.81);Other abnormalities of gait and mobility (R26.89);Muscle weakness (generalized) (M62.81);Difficulty in walking, not elsewhere classified (R26.2);Pain Pain - Right/Left: Left Pain - part of body: Shoulder    Time: 8811-0315 PT Time Calculation (min) (ACUTE ONLY): 25 min   Charges:   PT Evaluation $PT Eval Moderate Complexity: 1 Mod PT Treatments $Gait Training: 8-22 mins        Sargun Rummell B. Migdalia Dk PT, DPT Acute Rehabilitation Services Pager [phone] Office [phone]   [place] 01/24/2019, 10:07 AM

## 2019-01-25 ENCOUNTER — Telehealth: Payer: Self-pay | Admitting: Internal Medicine

## 2019-01-25 DIAGNOSIS — J209 Acute bronchitis, unspecified: Secondary | ICD-10-CM | POA: Diagnosis not present

## 2019-01-25 NOTE — Telephone Encounter (Signed)
Pt called to report that she used her cell phone on the same side as her pacemaker.. she felt so bad and I reassured her that it is an easy thing to do and it will take some time for her to get used to having her device..   Pt says she feels good and her pacer is not beeping or anything...  Pt also reported burping up some acid taste in her throat.. I advised her to take her pepcid and to monitor if anything worsens to call and let us know and she can call her PMD Dr. [name]... Pt verbalized understanding and agreed.

## 2019-01-25 NOTE — Telephone Encounter (Signed)
New Message    Pts husband is calling and she forgot about using the cell phone and used it for about 3-5 minutes and she wants to know if that could have done anything? She also is coughing up some bitter stuff  Please call

## 2019-01-25 NOTE — Telephone Encounter (Signed)
No action needed

## 2019-01-26 ENCOUNTER — Telehealth: Payer: Self-pay | Admitting: Internal Medicine

## 2019-01-26 DIAGNOSIS — J9601 Acute respiratory failure with hypoxia: Secondary | ICD-10-CM | POA: Diagnosis not present

## 2019-01-26 DIAGNOSIS — I4891 Unspecified atrial fibrillation: Secondary | ICD-10-CM | POA: Diagnosis not present

## 2019-01-26 DIAGNOSIS — I5021 Acute systolic (congestive) heart failure: Secondary | ICD-10-CM | POA: Diagnosis not present

## 2019-01-26 DIAGNOSIS — Z0001 Encounter for general adult medical examination with abnormal findings: Secondary | ICD-10-CM | POA: Diagnosis not present

## 2019-01-26 DIAGNOSIS — N183 Chronic kidney disease, stage 3 (moderate): Secondary | ICD-10-CM | POA: Diagnosis not present

## 2019-01-26 NOTE — Telephone Encounter (Signed)
Responded via my chart. No further action needed.

## 2019-01-26 NOTE — Telephone Encounter (Signed)
New Message:    Pt wants to know if she can take Pepcid or Ondansartan for nausea after surgery? If nt, what can she takeN

## 2019-01-28 DIAGNOSIS — M1990 Unspecified osteoarthritis, unspecified site: Secondary | ICD-10-CM | POA: Diagnosis not present

## 2019-01-28 DIAGNOSIS — K219 Gastro-esophageal reflux disease without esophagitis: Secondary | ICD-10-CM | POA: Diagnosis not present

## 2019-01-28 DIAGNOSIS — Z7901 Long term (current) use of anticoagulants: Secondary | ICD-10-CM | POA: Diagnosis not present

## 2019-01-28 DIAGNOSIS — Z87891 Personal history of nicotine dependence: Secondary | ICD-10-CM | POA: Diagnosis not present

## 2019-01-28 DIAGNOSIS — N183 Chronic kidney disease, stage 3 (moderate): Secondary | ICD-10-CM | POA: Diagnosis not present

## 2019-01-28 DIAGNOSIS — J189 Pneumonia, unspecified organism: Secondary | ICD-10-CM | POA: Diagnosis not present

## 2019-01-28 DIAGNOSIS — K5790 Diverticulosis of intestine, part unspecified, without perforation or abscess without bleeding: Secondary | ICD-10-CM | POA: Diagnosis not present

## 2019-01-28 DIAGNOSIS — E785 Hyperlipidemia, unspecified: Secondary | ICD-10-CM | POA: Diagnosis not present

## 2019-01-28 DIAGNOSIS — E46 Unspecified protein-calorie malnutrition: Secondary | ICD-10-CM | POA: Diagnosis not present

## 2019-01-28 DIAGNOSIS — I5043 Acute on chronic combined systolic (congestive) and diastolic (congestive) heart failure: Secondary | ICD-10-CM | POA: Diagnosis not present

## 2019-01-28 DIAGNOSIS — I429 Cardiomyopathy, unspecified: Secondary | ICD-10-CM | POA: Diagnosis not present

## 2019-01-28 DIAGNOSIS — Z85828 Personal history of other malignant neoplasm of skin: Secondary | ICD-10-CM | POA: Diagnosis not present

## 2019-01-28 DIAGNOSIS — I4819 Other persistent atrial fibrillation: Secondary | ICD-10-CM | POA: Diagnosis not present

## 2019-01-29 ENCOUNTER — Other Ambulatory Visit: Payer: Self-pay

## 2019-01-29 ENCOUNTER — Telehealth: Payer: Self-pay | Admitting: Internal Medicine

## 2019-01-29 NOTE — Telephone Encounter (Signed)
Able to reach patient. Pt denies chest pain. Some left arm pain with movement of arm, improved today, pt feels like it originates at PPM incision. Feels tightness in her abdomen, some ShOB that comes and goes. Lorazepam helped her ShOB last night. O2 sats in the 90s. Early satiation for the past few days. Feels like she may be constipated, but did have a BM yesterday. Weight stable at 137lbs per pt. Taking furosemide as scheduled (Tues, Thurs, Sat, Sun), however pt reports less urine output after taking furosemide for the past week or so. Pt reports [place] RN told her that her lungs were clear. Feels fine right now, no ShOB or arm pain.  Pt had planned to wait until her PPM wound check on 4/27 to address these issues with Dr. Rayann Heman. Explained that wound check on 4/27 is with an Therapist, sports. Advised I will call her back tomorrow to assist with sending a manual PPM transmission for review as it may take >20 minutes to transmit. ED precautions discussed for new or worsening symptoms. Pt verbalizes understanding and requests a call back after 10am tomorrow.  Routed to Dr. Rayann Heman for review.

## 2019-01-29 NOTE — Telephone Encounter (Signed)
1658 hrs: Attempted to reach patient, lines just rings. Appears patient may need to go to the ED with chest pain radiating into left arm - cc

## 2019-01-29 NOTE — Patient Outreach (Signed)
[place] [facility]) Care Management  01/29/2019  Carol Hardin [birth year] [id number]    Successful outreach with Carol Hardin. She continues to experience exertional dyspnea. Also reports a few episodes of shortness of breath at rest.  Reports compliance with medications and using O2 at 2L/min as recommended. Reports the following readings today: BP-95/74, pulse-80, O2 sat-96%, weight-137lbs. She confirmed start of home health services with [facility]. Reports home health nurse removed the bandage to her groin and the wound is intact. She is pending initial assessments for physical therapy and occupational therapy. Carol Hardin expressed concerns regarding her [email or web address]  transmitter and wanted to ensure that her device was functioning properly. Agreed to contact Dr. Rayann Heman following this call.  PLAN Will contact PCP to confirm outreach on 02/02/19. Will follow up with Mrs. Facundo within a week.   [place] [phone]

## 2019-01-29 NOTE — Telephone Encounter (Signed)
New message   Pt c/o Shortness Of Breath: STAT if SOB developed within the last 24 hours or pt is noticeably SOB on the phone  1. Are you currently SOB (can you hear that pt is SOB on the phone)? Patient states that she felt sob when eating today  2. How long have you been experiencing SOB?patient states that she has had sob since 01/24/2019  3. Are you SOB when sitting or when up moving around? Both   4. Are you currently experiencing any other symptoms? Pain left arm,tightening thru chest area

## 2019-01-29 NOTE — Telephone Encounter (Signed)
Pt says she was told to contact Dr Rayann Heman for device problems. Will forward to device clinic - says she would like to talk to a device nurse or Dr Allred/nurse - says nurse came to her home from [place] and looked at her bandage area around the groin and says this was taken off - listened to her lungs and was told they were clear with no fluid clear. Is still having SOB/some tingling in her chest and L arm. Will forward to device and Dr Rayann Heman

## 2019-01-30 ENCOUNTER — Telehealth: Payer: Self-pay | Admitting: Internal Medicine

## 2019-01-30 DIAGNOSIS — I4819 Other persistent atrial fibrillation: Secondary | ICD-10-CM | POA: Diagnosis not present

## 2019-01-30 DIAGNOSIS — J189 Pneumonia, unspecified organism: Secondary | ICD-10-CM | POA: Diagnosis not present

## 2019-01-30 DIAGNOSIS — N183 Chronic kidney disease, stage 3 (moderate): Secondary | ICD-10-CM | POA: Diagnosis not present

## 2019-01-30 DIAGNOSIS — K5790 Diverticulosis of intestine, part unspecified, without perforation or abscess without bleeding: Secondary | ICD-10-CM | POA: Diagnosis not present

## 2019-01-30 DIAGNOSIS — E46 Unspecified protein-calorie malnutrition: Secondary | ICD-10-CM | POA: Diagnosis not present

## 2019-01-30 DIAGNOSIS — I5043 Acute on chronic combined systolic (congestive) and diastolic (congestive) heart failure: Secondary | ICD-10-CM | POA: Diagnosis not present

## 2019-01-30 DIAGNOSIS — K219 Gastro-esophageal reflux disease without esophagitis: Secondary | ICD-10-CM | POA: Diagnosis not present

## 2019-01-30 DIAGNOSIS — Z87891 Personal history of nicotine dependence: Secondary | ICD-10-CM | POA: Diagnosis not present

## 2019-01-30 DIAGNOSIS — Z85828 Personal history of other malignant neoplasm of skin: Secondary | ICD-10-CM | POA: Diagnosis not present

## 2019-01-30 DIAGNOSIS — M1990 Unspecified osteoarthritis, unspecified site: Secondary | ICD-10-CM | POA: Diagnosis not present

## 2019-01-30 DIAGNOSIS — E785 Hyperlipidemia, unspecified: Secondary | ICD-10-CM | POA: Diagnosis not present

## 2019-01-30 DIAGNOSIS — I429 Cardiomyopathy, unspecified: Secondary | ICD-10-CM | POA: Diagnosis not present

## 2019-01-30 DIAGNOSIS — Z7901 Long term (current) use of anticoagulants: Secondary | ICD-10-CM | POA: Diagnosis not present

## 2019-01-30 NOTE — Telephone Encounter (Signed)
Spoke with pt. She reports she is currently in a session with her OT so she is unable to send a transmission from her monitor. She is agreeable to a call back around 11:30am.

## 2019-01-30 NOTE — Telephone Encounter (Signed)
Transmission received. Normal device function. Lead trends stable. Presenting rhythm: AF/BiVp @ 80bpm. Persistent AF since implant. No high ventricular rates noted. Ap <1%, BiVp 97%. Histograms consistent with DDD programming at 80bpm. Plan to lower base rate to 70bpm and enable rate response at upcoming wound check on 02/05/19.

## 2019-01-30 NOTE — Telephone Encounter (Signed)
LMOVM for Carol Hardin. Requested call back to DC.

## 2019-01-30 NOTE — Telephone Encounter (Signed)
Spoke with patient. She sat up last night and watched TV for ~3hrs without any ShOB or tightness on room air. Her shoulder/upper arm soreness is much improved today. Pt walked to the kitchen with OT without O2 sat dropping. Uses 2L O2 via [place] when she feels ShOB. Assisted pt with sending a transmission--monitor downloading software update. Advised I will call back this afternoon to troubleshoot monitor if transmission isn't received after update. She is agreeable to this plan.

## 2019-01-30 NOTE — Telephone Encounter (Signed)
  Carol Hardin with [facility] would like to speak with the nurse regarding any cardiac precautions that Carol Hardin still has in place. Please call to discuss.

## 2019-01-30 NOTE — Telephone Encounter (Signed)
Spoke with patient advised of normal transmission and device function. Pt reports this is what she needed to know. She is feeling better today. Advised pt to call the office back if she feels like her ShOB is worsening. She plans to continue current medications will keep tracking daily weights. Advised I'll call her back if Dr. Rayann Heman has any additional recommendations. Pt is agreeable to this plan and denies questions or concerns at this time.

## 2019-01-31 NOTE — Telephone Encounter (Signed)
Carol Hardin returning your call. Please call her back [phone]

## 2019-01-31 NOTE — Telephone Encounter (Signed)
Returned call to [employer]. Discussed post-implant instructions with regard to ADLs. Carol Hardin verbalizes understanding and thanked me for my call.

## 2019-02-01 DIAGNOSIS — I4819 Other persistent atrial fibrillation: Secondary | ICD-10-CM | POA: Diagnosis not present

## 2019-02-01 DIAGNOSIS — Z87891 Personal history of nicotine dependence: Secondary | ICD-10-CM | POA: Diagnosis not present

## 2019-02-01 DIAGNOSIS — K5790 Diverticulosis of intestine, part unspecified, without perforation or abscess without bleeding: Secondary | ICD-10-CM | POA: Diagnosis not present

## 2019-02-01 DIAGNOSIS — K219 Gastro-esophageal reflux disease without esophagitis: Secondary | ICD-10-CM | POA: Diagnosis not present

## 2019-02-01 DIAGNOSIS — E785 Hyperlipidemia, unspecified: Secondary | ICD-10-CM | POA: Diagnosis not present

## 2019-02-01 DIAGNOSIS — Z85828 Personal history of other malignant neoplasm of skin: Secondary | ICD-10-CM | POA: Diagnosis not present

## 2019-02-01 DIAGNOSIS — E46 Unspecified protein-calorie malnutrition: Secondary | ICD-10-CM | POA: Diagnosis not present

## 2019-02-01 DIAGNOSIS — Z7901 Long term (current) use of anticoagulants: Secondary | ICD-10-CM | POA: Diagnosis not present

## 2019-02-01 DIAGNOSIS — I5043 Acute on chronic combined systolic (congestive) and diastolic (congestive) heart failure: Secondary | ICD-10-CM | POA: Diagnosis not present

## 2019-02-01 DIAGNOSIS — I429 Cardiomyopathy, unspecified: Secondary | ICD-10-CM | POA: Diagnosis not present

## 2019-02-01 DIAGNOSIS — N183 Chronic kidney disease, stage 3 (moderate): Secondary | ICD-10-CM | POA: Diagnosis not present

## 2019-02-01 DIAGNOSIS — M1990 Unspecified osteoarthritis, unspecified site: Secondary | ICD-10-CM | POA: Diagnosis not present

## 2019-02-01 DIAGNOSIS — J189 Pneumonia, unspecified organism: Secondary | ICD-10-CM | POA: Diagnosis not present

## 2019-02-05 ENCOUNTER — Other Ambulatory Visit: Payer: Self-pay

## 2019-02-05 ENCOUNTER — Ambulatory Visit (INDEPENDENT_AMBULATORY_CARE_PROVIDER_SITE_OTHER): Payer: Medicare Other | Admitting: *Deleted

## 2019-02-05 DIAGNOSIS — I5022 Chronic systolic (congestive) heart failure: Secondary | ICD-10-CM

## 2019-02-05 DIAGNOSIS — I4819 Other persistent atrial fibrillation: Secondary | ICD-10-CM | POA: Diagnosis not present

## 2019-02-05 LAB — CUP PACEART INCLINIC DEVICE CHECK
Battery Remaining Longevity: 81 mo
Battery Voltage: 2.99 V
Brady Statistic RA Percent Paced: 0.87 %
Brady Statistic RV Percent Paced: 96 %
Date Time Interrogation Session: 20200427145850
Implantable Lead Implant Date: 20200414
Implantable Lead Implant Date: 20200414
Implantable Lead Implant Date: 20200414
Implantable Lead Location: 753858
Implantable Lead Location: 753859
Implantable Lead Location: 753860
Implantable Pulse Generator Implant Date: 20200414
Lead Channel Impedance Value: 550 Ohm
Lead Channel Impedance Value: 575 Ohm
Lead Channel Impedance Value: 587.5 Ohm
Lead Channel Pacing Threshold Amplitude: 0.5 V
Lead Channel Pacing Threshold Amplitude: 1.75 V
Lead Channel Pacing Threshold Pulse Width: 0.5 ms
Lead Channel Pacing Threshold Pulse Width: 0.5 ms
Lead Channel Sensing Intrinsic Amplitude: 10.1 mV
Lead Channel Sensing Intrinsic Amplitude: 2.2 mV
Lead Channel Setting Pacing Amplitude: 2.25 V
Lead Channel Setting Pacing Amplitude: 2.5 V
Lead Channel Setting Pacing Amplitude: 3.5 V
Lead Channel Setting Pacing Pulse Width: 0.5 ms
Lead Channel Setting Pacing Pulse Width: 0.5 ms
Lead Channel Setting Sensing Sensitivity: 4 mV
Pulse Gen Serial Number: 9431558

## 2019-02-05 NOTE — Patient Outreach (Signed)
[place] [facility]) Care Management  02/05/2019  Carol Hardin Feb 06, 1933 [id number]    Follow up outreach with Carol Hardin. She completed the Cardiology outreach as scheduled. States concerns regarding her pacemaker, surgical wound, and post-op restrictions were addressed during the visit. Reports ambulating well. She has not required use of home oxygen for about two days. She is very eager to return to her previous level of activity, but overall felt today was a "good day." No immediate care management needs. Will contact RNCM if needed prior to next outreach.  PLAN Will continue routine outreach.   [place] [phone]

## 2019-02-05 NOTE — Progress Notes (Signed)
Wound check appointment. Steri-strips removed. Wound without redness or edema. Incision edges approximated, wound well healed. Normal device function. Thresholds, sensing, and impedances consistent with implant measurements. Device programmed with RA output at 3.5V/as unable to test threshold due to AF. per Dr Rayann Heman: RV output reduced to 2.5 V at 0.5 ms, LV output reprogrammed to have 0.5 v safety margin. Histogram distribution consistent with programming at rate at 80. Per Dr Rayann Heman: base rate reduced to 70, sensor turned on, S/P AV node ablation. No mode switches or high ventricular rates noted. Patient educated about wound care, arm mobility, lifting restrictions. ROV in 2month with Dr Rayann Heman.

## 2019-02-06 DIAGNOSIS — N183 Chronic kidney disease, stage 3 (moderate): Secondary | ICD-10-CM | POA: Diagnosis not present

## 2019-02-06 DIAGNOSIS — K5790 Diverticulosis of intestine, part unspecified, without perforation or abscess without bleeding: Secondary | ICD-10-CM | POA: Diagnosis not present

## 2019-02-06 DIAGNOSIS — Z87891 Personal history of nicotine dependence: Secondary | ICD-10-CM | POA: Diagnosis not present

## 2019-02-06 DIAGNOSIS — E785 Hyperlipidemia, unspecified: Secondary | ICD-10-CM | POA: Diagnosis not present

## 2019-02-06 DIAGNOSIS — M1990 Unspecified osteoarthritis, unspecified site: Secondary | ICD-10-CM | POA: Diagnosis not present

## 2019-02-06 DIAGNOSIS — I429 Cardiomyopathy, unspecified: Secondary | ICD-10-CM | POA: Diagnosis not present

## 2019-02-06 DIAGNOSIS — E46 Unspecified protein-calorie malnutrition: Secondary | ICD-10-CM | POA: Diagnosis not present

## 2019-02-06 DIAGNOSIS — K219 Gastro-esophageal reflux disease without esophagitis: Secondary | ICD-10-CM | POA: Diagnosis not present

## 2019-02-06 DIAGNOSIS — J189 Pneumonia, unspecified organism: Secondary | ICD-10-CM | POA: Diagnosis not present

## 2019-02-06 DIAGNOSIS — I5043 Acute on chronic combined systolic (congestive) and diastolic (congestive) heart failure: Secondary | ICD-10-CM | POA: Diagnosis not present

## 2019-02-06 DIAGNOSIS — Z85828 Personal history of other malignant neoplasm of skin: Secondary | ICD-10-CM | POA: Diagnosis not present

## 2019-02-06 DIAGNOSIS — I4819 Other persistent atrial fibrillation: Secondary | ICD-10-CM | POA: Diagnosis not present

## 2019-02-06 DIAGNOSIS — Z7901 Long term (current) use of anticoagulants: Secondary | ICD-10-CM | POA: Diagnosis not present

## 2019-02-07 DIAGNOSIS — I4819 Other persistent atrial fibrillation: Secondary | ICD-10-CM | POA: Diagnosis not present

## 2019-02-07 DIAGNOSIS — K219 Gastro-esophageal reflux disease without esophagitis: Secondary | ICD-10-CM | POA: Diagnosis not present

## 2019-02-07 DIAGNOSIS — K5790 Diverticulosis of intestine, part unspecified, without perforation or abscess without bleeding: Secondary | ICD-10-CM | POA: Diagnosis not present

## 2019-02-07 DIAGNOSIS — Z85828 Personal history of other malignant neoplasm of skin: Secondary | ICD-10-CM | POA: Diagnosis not present

## 2019-02-07 DIAGNOSIS — E785 Hyperlipidemia, unspecified: Secondary | ICD-10-CM | POA: Diagnosis not present

## 2019-02-07 DIAGNOSIS — M1990 Unspecified osteoarthritis, unspecified site: Secondary | ICD-10-CM | POA: Diagnosis not present

## 2019-02-07 DIAGNOSIS — N183 Chronic kidney disease, stage 3 (moderate): Secondary | ICD-10-CM | POA: Diagnosis not present

## 2019-02-07 DIAGNOSIS — E46 Unspecified protein-calorie malnutrition: Secondary | ICD-10-CM | POA: Diagnosis not present

## 2019-02-07 DIAGNOSIS — Z87891 Personal history of nicotine dependence: Secondary | ICD-10-CM | POA: Diagnosis not present

## 2019-02-07 DIAGNOSIS — Z7901 Long term (current) use of anticoagulants: Secondary | ICD-10-CM | POA: Diagnosis not present

## 2019-02-07 DIAGNOSIS — I5043 Acute on chronic combined systolic (congestive) and diastolic (congestive) heart failure: Secondary | ICD-10-CM | POA: Diagnosis not present

## 2019-02-07 DIAGNOSIS — J189 Pneumonia, unspecified organism: Secondary | ICD-10-CM | POA: Diagnosis not present

## 2019-02-07 DIAGNOSIS — I429 Cardiomyopathy, unspecified: Secondary | ICD-10-CM | POA: Diagnosis not present

## 2019-02-08 DIAGNOSIS — M1990 Unspecified osteoarthritis, unspecified site: Secondary | ICD-10-CM | POA: Diagnosis not present

## 2019-02-08 DIAGNOSIS — J189 Pneumonia, unspecified organism: Secondary | ICD-10-CM | POA: Diagnosis not present

## 2019-02-08 DIAGNOSIS — K5790 Diverticulosis of intestine, part unspecified, without perforation or abscess without bleeding: Secondary | ICD-10-CM | POA: Diagnosis not present

## 2019-02-08 DIAGNOSIS — Z7901 Long term (current) use of anticoagulants: Secondary | ICD-10-CM | POA: Diagnosis not present

## 2019-02-08 DIAGNOSIS — Z85828 Personal history of other malignant neoplasm of skin: Secondary | ICD-10-CM | POA: Diagnosis not present

## 2019-02-08 DIAGNOSIS — K219 Gastro-esophageal reflux disease without esophagitis: Secondary | ICD-10-CM | POA: Diagnosis not present

## 2019-02-08 DIAGNOSIS — Z87891 Personal history of nicotine dependence: Secondary | ICD-10-CM | POA: Diagnosis not present

## 2019-02-08 DIAGNOSIS — E46 Unspecified protein-calorie malnutrition: Secondary | ICD-10-CM | POA: Diagnosis not present

## 2019-02-08 DIAGNOSIS — I5043 Acute on chronic combined systolic (congestive) and diastolic (congestive) heart failure: Secondary | ICD-10-CM | POA: Diagnosis not present

## 2019-02-08 DIAGNOSIS — I429 Cardiomyopathy, unspecified: Secondary | ICD-10-CM | POA: Diagnosis not present

## 2019-02-08 DIAGNOSIS — N183 Chronic kidney disease, stage 3 (moderate): Secondary | ICD-10-CM | POA: Diagnosis not present

## 2019-02-08 DIAGNOSIS — I4819 Other persistent atrial fibrillation: Secondary | ICD-10-CM | POA: Diagnosis not present

## 2019-02-08 DIAGNOSIS — E785 Hyperlipidemia, unspecified: Secondary | ICD-10-CM | POA: Diagnosis not present

## 2019-02-11 DIAGNOSIS — R0600 Dyspnea, unspecified: Secondary | ICD-10-CM | POA: Diagnosis not present

## 2019-02-11 DIAGNOSIS — I5020 Unspecified systolic (congestive) heart failure: Secondary | ICD-10-CM | POA: Diagnosis not present

## 2019-02-11 DIAGNOSIS — R0602 Shortness of breath: Secondary | ICD-10-CM | POA: Diagnosis not present

## 2019-02-11 DIAGNOSIS — R0902 Hypoxemia: Secondary | ICD-10-CM | POA: Diagnosis not present

## 2019-02-13 DIAGNOSIS — I5043 Acute on chronic combined systolic (congestive) and diastolic (congestive) heart failure: Secondary | ICD-10-CM | POA: Diagnosis not present

## 2019-02-13 DIAGNOSIS — N183 Chronic kidney disease, stage 3 (moderate): Secondary | ICD-10-CM | POA: Diagnosis not present

## 2019-02-13 DIAGNOSIS — K5790 Diverticulosis of intestine, part unspecified, without perforation or abscess without bleeding: Secondary | ICD-10-CM | POA: Diagnosis not present

## 2019-02-13 DIAGNOSIS — E785 Hyperlipidemia, unspecified: Secondary | ICD-10-CM | POA: Diagnosis not present

## 2019-02-13 DIAGNOSIS — Z85828 Personal history of other malignant neoplasm of skin: Secondary | ICD-10-CM | POA: Diagnosis not present

## 2019-02-13 DIAGNOSIS — I429 Cardiomyopathy, unspecified: Secondary | ICD-10-CM | POA: Diagnosis not present

## 2019-02-13 DIAGNOSIS — Z87891 Personal history of nicotine dependence: Secondary | ICD-10-CM | POA: Diagnosis not present

## 2019-02-13 DIAGNOSIS — I4819 Other persistent atrial fibrillation: Secondary | ICD-10-CM | POA: Diagnosis not present

## 2019-02-13 DIAGNOSIS — J189 Pneumonia, unspecified organism: Secondary | ICD-10-CM | POA: Diagnosis not present

## 2019-02-13 DIAGNOSIS — Z7901 Long term (current) use of anticoagulants: Secondary | ICD-10-CM | POA: Diagnosis not present

## 2019-02-13 DIAGNOSIS — E46 Unspecified protein-calorie malnutrition: Secondary | ICD-10-CM | POA: Diagnosis not present

## 2019-02-13 DIAGNOSIS — K219 Gastro-esophageal reflux disease without esophagitis: Secondary | ICD-10-CM | POA: Diagnosis not present

## 2019-02-13 DIAGNOSIS — Z0000 Encounter for general adult medical examination without abnormal findings: Secondary | ICD-10-CM | POA: Diagnosis not present

## 2019-02-13 DIAGNOSIS — M1990 Unspecified osteoarthritis, unspecified site: Secondary | ICD-10-CM | POA: Diagnosis not present

## 2019-02-15 DIAGNOSIS — R7301 Impaired fasting glucose: Secondary | ICD-10-CM | POA: Diagnosis not present

## 2019-02-15 DIAGNOSIS — Z7901 Long term (current) use of anticoagulants: Secondary | ICD-10-CM | POA: Diagnosis not present

## 2019-02-15 DIAGNOSIS — K5790 Diverticulosis of intestine, part unspecified, without perforation or abscess without bleeding: Secondary | ICD-10-CM | POA: Diagnosis not present

## 2019-02-15 DIAGNOSIS — E46 Unspecified protein-calorie malnutrition: Secondary | ICD-10-CM | POA: Diagnosis not present

## 2019-02-15 DIAGNOSIS — R944 Abnormal results of kidney function studies: Secondary | ICD-10-CM | POA: Diagnosis not present

## 2019-02-15 DIAGNOSIS — J189 Pneumonia, unspecified organism: Secondary | ICD-10-CM | POA: Diagnosis not present

## 2019-02-15 DIAGNOSIS — K219 Gastro-esophageal reflux disease without esophagitis: Secondary | ICD-10-CM | POA: Diagnosis not present

## 2019-02-15 DIAGNOSIS — E785 Hyperlipidemia, unspecified: Secondary | ICD-10-CM | POA: Diagnosis not present

## 2019-02-15 DIAGNOSIS — M1990 Unspecified osteoarthritis, unspecified site: Secondary | ICD-10-CM | POA: Diagnosis not present

## 2019-02-15 DIAGNOSIS — Z85828 Personal history of other malignant neoplasm of skin: Secondary | ICD-10-CM | POA: Diagnosis not present

## 2019-02-15 DIAGNOSIS — I429 Cardiomyopathy, unspecified: Secondary | ICD-10-CM | POA: Diagnosis not present

## 2019-02-15 DIAGNOSIS — I5043 Acute on chronic combined systolic (congestive) and diastolic (congestive) heart failure: Secondary | ICD-10-CM | POA: Diagnosis not present

## 2019-02-15 DIAGNOSIS — Z87891 Personal history of nicotine dependence: Secondary | ICD-10-CM | POA: Diagnosis not present

## 2019-02-15 DIAGNOSIS — E782 Mixed hyperlipidemia: Secondary | ICD-10-CM | POA: Diagnosis not present

## 2019-02-15 DIAGNOSIS — I4819 Other persistent atrial fibrillation: Secondary | ICD-10-CM | POA: Diagnosis not present

## 2019-02-15 DIAGNOSIS — N183 Chronic kidney disease, stage 3 (moderate): Secondary | ICD-10-CM | POA: Diagnosis not present

## 2019-02-16 DIAGNOSIS — I4819 Other persistent atrial fibrillation: Secondary | ICD-10-CM | POA: Diagnosis not present

## 2019-02-16 DIAGNOSIS — Z85828 Personal history of other malignant neoplasm of skin: Secondary | ICD-10-CM | POA: Diagnosis not present

## 2019-02-16 DIAGNOSIS — E785 Hyperlipidemia, unspecified: Secondary | ICD-10-CM | POA: Diagnosis not present

## 2019-02-16 DIAGNOSIS — M1990 Unspecified osteoarthritis, unspecified site: Secondary | ICD-10-CM | POA: Diagnosis not present

## 2019-02-16 DIAGNOSIS — I429 Cardiomyopathy, unspecified: Secondary | ICD-10-CM | POA: Diagnosis not present

## 2019-02-16 DIAGNOSIS — J189 Pneumonia, unspecified organism: Secondary | ICD-10-CM | POA: Diagnosis not present

## 2019-02-16 DIAGNOSIS — E46 Unspecified protein-calorie malnutrition: Secondary | ICD-10-CM | POA: Diagnosis not present

## 2019-02-16 DIAGNOSIS — I5043 Acute on chronic combined systolic (congestive) and diastolic (congestive) heart failure: Secondary | ICD-10-CM | POA: Diagnosis not present

## 2019-02-16 DIAGNOSIS — K5790 Diverticulosis of intestine, part unspecified, without perforation or abscess without bleeding: Secondary | ICD-10-CM | POA: Diagnosis not present

## 2019-02-16 DIAGNOSIS — N183 Chronic kidney disease, stage 3 (moderate): Secondary | ICD-10-CM | POA: Diagnosis not present

## 2019-02-16 DIAGNOSIS — Z87891 Personal history of nicotine dependence: Secondary | ICD-10-CM | POA: Diagnosis not present

## 2019-02-16 DIAGNOSIS — K219 Gastro-esophageal reflux disease without esophagitis: Secondary | ICD-10-CM | POA: Diagnosis not present

## 2019-02-16 DIAGNOSIS — Z7901 Long term (current) use of anticoagulants: Secondary | ICD-10-CM | POA: Diagnosis not present

## 2019-02-19 ENCOUNTER — Telehealth: Payer: Self-pay | Admitting: Internal Medicine

## 2019-02-19 ENCOUNTER — Other Ambulatory Visit: Payer: Self-pay

## 2019-02-19 NOTE — Patient Outreach (Signed)
[place] [facility]) Care Management  02/19/2019  Carol Hardin Sep 10, 1933 [id number]   Successful outreach with [name]. She experienced several episodes of pain around her pacemaker over the weekend. Describes episodes as "small shooting pains." Also experienced pain related to indigestion on Saturday. Reports the pain resolved with ambulation. She did not feel that immediate medical attention was needed. Reports discussing concerns with Dr. Jackalyn Lombard staff and pending follow-up outreach.  Today she reports feeling "pretty good." No complaints of pain, indigestion or chest discomfort at time of call. Denies complaints of shortness of breath. Reports a nonproductive cough. She has not required supplemental oxygen for several days.   Discussed care management needs. No changes since last outreach. She continues to receive PT, OT and skilled nursing services with [facility]. Pending telehealth visit with Dr. [name] on 02/20/19.  PLAN Will follow up later this month.   [place] [phone]

## 2019-02-19 NOTE — Telephone Encounter (Signed)
New Message    Pt is calling and says she needs to report that she was having a sensation as described as she was trying to release tension or like clear a burp in her chest. She said it was coming from the pace maker     Please call

## 2019-02-20 DIAGNOSIS — Z85828 Personal history of other malignant neoplasm of skin: Secondary | ICD-10-CM | POA: Diagnosis not present

## 2019-02-20 DIAGNOSIS — N183 Chronic kidney disease, stage 3 (moderate): Secondary | ICD-10-CM | POA: Diagnosis not present

## 2019-02-20 DIAGNOSIS — I429 Cardiomyopathy, unspecified: Secondary | ICD-10-CM | POA: Diagnosis not present

## 2019-02-20 DIAGNOSIS — Z7901 Long term (current) use of anticoagulants: Secondary | ICD-10-CM | POA: Diagnosis not present

## 2019-02-20 DIAGNOSIS — Z87891 Personal history of nicotine dependence: Secondary | ICD-10-CM | POA: Diagnosis not present

## 2019-02-20 DIAGNOSIS — I4819 Other persistent atrial fibrillation: Secondary | ICD-10-CM | POA: Diagnosis not present

## 2019-02-20 DIAGNOSIS — M1990 Unspecified osteoarthritis, unspecified site: Secondary | ICD-10-CM | POA: Diagnosis not present

## 2019-02-20 DIAGNOSIS — K5790 Diverticulosis of intestine, part unspecified, without perforation or abscess without bleeding: Secondary | ICD-10-CM | POA: Diagnosis not present

## 2019-02-20 DIAGNOSIS — E785 Hyperlipidemia, unspecified: Secondary | ICD-10-CM | POA: Diagnosis not present

## 2019-02-20 DIAGNOSIS — E46 Unspecified protein-calorie malnutrition: Secondary | ICD-10-CM | POA: Diagnosis not present

## 2019-02-20 DIAGNOSIS — K219 Gastro-esophageal reflux disease without esophagitis: Secondary | ICD-10-CM | POA: Diagnosis not present

## 2019-02-20 DIAGNOSIS — E782 Mixed hyperlipidemia: Secondary | ICD-10-CM | POA: Diagnosis not present

## 2019-02-20 DIAGNOSIS — N182 Chronic kidney disease, stage 2 (mild): Secondary | ICD-10-CM | POA: Diagnosis not present

## 2019-02-20 DIAGNOSIS — R944 Abnormal results of kidney function studies: Secondary | ICD-10-CM | POA: Diagnosis not present

## 2019-02-20 DIAGNOSIS — I5043 Acute on chronic combined systolic (congestive) and diastolic (congestive) heart failure: Secondary | ICD-10-CM | POA: Diagnosis not present

## 2019-02-20 DIAGNOSIS — J189 Pneumonia, unspecified organism: Secondary | ICD-10-CM | POA: Diagnosis not present

## 2019-02-20 NOTE — Telephone Encounter (Signed)
On Saturday, the patient had pain in her stomach and left shoulder that felt like gas pains.  Improved with walking. Pain hasn't returned.  No other chest pain. She is having persistent shortness of breath that she feels has worsened since decreasing pacing rate from 80 to 70.  She is frustrated and feels like she hasn't had the time to discuss things with Dr Rayann Heman like she would like.  I have offered an appt with Dr Rayann Heman tomorrow in the office for comprehensive device evaluation and visit with him.  She agrees with plan.  I also reviewed with the patient that I am not sure that her symptoms are coming from her device or could all be explained by reprogramming  Chanetta Marshall, NP 02/20/2019 9:18 AM

## 2019-02-21 ENCOUNTER — Encounter: Payer: Self-pay | Admitting: Internal Medicine

## 2019-02-21 ENCOUNTER — Other Ambulatory Visit: Payer: Self-pay

## 2019-02-21 ENCOUNTER — Ambulatory Visit (INDEPENDENT_AMBULATORY_CARE_PROVIDER_SITE_OTHER): Payer: Medicare Other | Admitting: Internal Medicine

## 2019-02-21 VITALS — BP 98/66 | HR 70 | Ht 65.0 in | Wt 136.6 lb

## 2019-02-21 DIAGNOSIS — I4819 Other persistent atrial fibrillation: Secondary | ICD-10-CM

## 2019-02-21 DIAGNOSIS — I5022 Chronic systolic (congestive) heart failure: Secondary | ICD-10-CM

## 2019-02-21 DIAGNOSIS — Z950 Presence of cardiac pacemaker: Secondary | ICD-10-CM | POA: Diagnosis not present

## 2019-02-21 LAB — CUP PACEART INCLINIC DEVICE CHECK
Battery Remaining Longevity: 87 mo
Battery Voltage: 2.99 V
Brady Statistic RA Percent Paced: 0.31 %
Brady Statistic RV Percent Paced: 94 %
Date Time Interrogation Session: 20200513172357
Implantable Lead Implant Date: 20200414
Implantable Lead Implant Date: 20200414
Implantable Lead Implant Date: 20200414
Implantable Lead Location: 753858
Implantable Lead Location: 753859
Implantable Lead Location: 753860
Implantable Pulse Generator Implant Date: 20200414
Lead Channel Impedance Value: 575 Ohm
Lead Channel Impedance Value: 575 Ohm
Lead Channel Impedance Value: 700 Ohm
Lead Channel Pacing Threshold Amplitude: 0.5 V
Lead Channel Pacing Threshold Amplitude: 0.5 V
Lead Channel Pacing Threshold Amplitude: 2 V
Lead Channel Pacing Threshold Amplitude: 2 V
Lead Channel Pacing Threshold Pulse Width: 0.5 ms
Lead Channel Pacing Threshold Pulse Width: 0.5 ms
Lead Channel Pacing Threshold Pulse Width: 0.5 ms
Lead Channel Pacing Threshold Pulse Width: 0.5 ms
Lead Channel Sensing Intrinsic Amplitude: 1 mV
Lead Channel Sensing Intrinsic Amplitude: 8.1 mV
Lead Channel Setting Pacing Amplitude: 2.5 V
Lead Channel Setting Pacing Amplitude: 2.5 V
Lead Channel Setting Pacing Amplitude: 3.5 V
Lead Channel Setting Pacing Pulse Width: 0.5 ms
Lead Channel Setting Pacing Pulse Width: 0.5 ms
Lead Channel Setting Sensing Sensitivity: 3 mV
Pulse Gen Serial Number: 9431558

## 2019-02-21 NOTE — Progress Notes (Signed)
PCP: Celene Squibb, MD Primary Cardiologist: Dr Harl Bowie Primary EP: Dr Rayann Heman  Carol Hardin is a 85 y.o. female who presents today for routine electrophysiology followup.  Since her BiV pacemaker implant with AV nodal ablation, the patient reports doing better.  She is very anxious.  She has numerous worries today.  She presents with her procedure documentation note and asks a number of questions about the implant procedure.  I have advised that she seems unneccesarily worried.  I have encouraged her to be active.  Reassured her today that her device implant procedure went very well and that her device function is normal today.   Today, she denies symptoms of palpitations, chest pain, shortness of breath,  lower extremity edema, dizziness, presyncope, or syncope.  The patient is otherwise without complaint today.   Past Medical History:  Diagnosis Date  . Anxiety   . Arthritis    "fingers, right toe" (08/09/2016)  . Basal cell carcinoma of lower leg, right   . Bradycardia    a. Holter 08/30/16 showed profound bradycardia down to 30 during awake hours, several 2 second pauses, very frequent PVCs >8000 in 48 hours, and NSVT (longest of 14 beats).  . Cardiomyopathy ([place])    a. EF 40% by echo 08/2016.  . Daily headache    "I usually wake up w/a headache; sometimes it's a migraine" (08/09/2016)  . Depression   . Diverticulosis    Hx. of  . Frequent PVCs   . GERD (gastroesophageal reflux disease)   . Hypercholesterolemia   . Migraine   . Mitral regurgitation   . MVP (mitral valve prolapse)   . NSVT (nonsustained ventricular tachycardia) ([place])    a. first noted event monitor 08/2016.  . Osteoporosis   . PAF (paroxysmal atrial fibrillation) ([place])    a. in afib at time of echo 08/2016.  Marland Kitchen Pneumonia   . Scoliosis    mild  . Squamous cell carcinoma of neck    Past Surgical History:  Procedure Laterality Date  . AV NODE ABLATION N/A 01/23/2019   Procedure: AV NODE ABLATION;   Surgeon: Thompson Grayer, MD;  Location: [facility];  Service: Cardiovascular;  Laterality: N/A;  . BASAL CELL CARCINOMA EXCISION Right    RLE  . BIV PACEMAKER INSERTION CRT-P N/A 01/23/2019   Procedure: BIV PACEMAKER INSERTION CRT-P;  Surgeon: Thompson Grayer, MD;  Location: [place];  Service: Cardiovascular;  Laterality: N/A;  . BLEPHAROPLASTY    . BREAST BIOPSY Left   . BREAST CYST ASPIRATION Left   . BREAST CYST EXCISION Left   . BUNIONECTOMY Bilateral   . DILATION AND CURETTAGE OF UTERUS    . EXCISIONAL HEMORRHOIDECTOMY    . INTRAMEDULLARY (IM) NAIL INTERTROCHANTERIC Left 12/31/2017   Procedure: INTRAMEDULLARY (IM) NAIL INTERTROCHANTRIC;  Surgeon: Nicholes Stairs, MD;  Location: [facility];  Service: Orthopedics;  Laterality: Left;  . KNEE ARTHROSCOPY Right 04/12/2007  . KNEE ARTHROSCOPY Left   . SQUAMOUS CELL CARCINOMA EXCISION     "neck"  . TONSILLECTOMY      ROS- all systems are reviewed and negatives except as per HPI above  Current Outpatient Medications  Medication Sig Dispense Refill  . acetaminophen (TYLENOL) 325 MG tablet Take 2 tablets (650 mg total) by mouth every 4 (four) hours as needed for headache or mild pain. 12 tablet 1  . apixaban (ELIQUIS) 5 MG TABS tablet Take 1 tablet (5 mg total) by mouth 2 (two) times daily. Please  hold until Friday afternoon 60 tablet 6  . busPIRone (BUSPAR) 5 MG tablet Take 5 mg by mouth 3 (three) times daily as needed (anxiety).    . Emollient ([employer] HAND & BODY) LOTN Apply 1 application topically daily as needed.    . famotidine (PEPCID) 20 MG tablet Take 20 mg by mouth daily.    . furosemide (LASIX) 20 MG tablet Take 1 tablet (20 mg total) by mouth every Tuesday, Thursday, Saturday, and Sunday. 20 tablet 0  . guaiFENesin (MUCINEX) 600 MG 12 hr tablet Take 600 mg by mouth as needed for cough. Takes PRN for congested cough. Purchased OTC. Has been taking daily around lunch.    Marland Kitchen LORazepam (ATIVAN) 1 MG tablet Take 1  mg by mouth at bedtime.    . polyethylene glycol (MIRALAX / GLYCOLAX) packet Take 17 g by mouth daily as needed for mild constipation.      No current facility-administered medications for this visit.     Physical Exam: Vitals:   02/21/19 1532  BP: 98/66  Pulse: 70  SpO2: 98%  Weight: 136 lb 9.6 oz (62 kg)  Height: 5\' 5"  (1.651 m)    GEN- The patient is anxious appearing, alert and oriented x 3 today.   Head- normocephalic, atraumatic Eyes-  Sclera clear, conjunctiva pink Ears- hearing intact Oropharynx- clear Lungs- Clear to ausculation bilaterally, normal work of breathing Heart- Regular rate and rhythm (paced) GI- soft, NT, ND, + BS Extremities- no clubbing, cyanosis, or edema  Wt Readings from Last 3 Encounters:  02/21/19 136 lb 9.6 oz (62 kg)  01/24/19 136 lb 11 oz (62 kg)  01/12/19 140 lb (63.5 kg)    EKG tracing ordered today is personally reviewed and shows afib , V paced  Assessment and Plan:  1. Persistent afib Doing well s/p AV node ablation On eliquis for chads2vasc score of 4.  2. S/p AV nodal ablation Normal device function I have offered to reduce her lower pacing rate to 60 bpm.  She is very worried about doing so.  No changes are made to her lower rate today.  3. Nonischemic CM/ LBBB Doing well clinically without overt CHF She is encouraged to follow-up with Dr Harl Bowie.  Return to see me in a month for additional reassurance.  Thompson Grayer MD, [facility] 02/21/2019 3:56 PM

## 2019-02-21 NOTE — Patient Instructions (Signed)
Medication Instructions:  Your physician recommends that you continue on your current medications as directed. Please refer to the Current Medication list given to you today.  Labwork: None ordered.  Testing/Procedures: None ordered.  Follow-Up: See appointment page.  Remote monitoring is used to monitor your Pacemaker from home. This monitoring reduces the number of office visits required to check your device to one time per year. It allows [place] to keep an eye on the functioning of your device to ensure it is working properly. You are scheduled for a device check from home on 08/13/2019. You may send your transmission at any time that day. If you have a wireless device, the transmission will be sent automatically. After your physician reviews your transmission, you will receive a postcard with your next transmission date.  Any Other Special Instructions Will Be Listed Below (If Applicable).  If you need a refill on your cardiac medications before your next appointment, please call your pharmacy.

## 2019-02-23 ENCOUNTER — Telehealth: Payer: Self-pay | Admitting: Internal Medicine

## 2019-02-23 DIAGNOSIS — Z87891 Personal history of nicotine dependence: Secondary | ICD-10-CM | POA: Diagnosis not present

## 2019-02-23 DIAGNOSIS — Z7901 Long term (current) use of anticoagulants: Secondary | ICD-10-CM | POA: Diagnosis not present

## 2019-02-23 DIAGNOSIS — I5043 Acute on chronic combined systolic (congestive) and diastolic (congestive) heart failure: Secondary | ICD-10-CM | POA: Diagnosis not present

## 2019-02-23 DIAGNOSIS — E46 Unspecified protein-calorie malnutrition: Secondary | ICD-10-CM | POA: Diagnosis not present

## 2019-02-23 DIAGNOSIS — I429 Cardiomyopathy, unspecified: Secondary | ICD-10-CM | POA: Diagnosis not present

## 2019-02-23 DIAGNOSIS — M1990 Unspecified osteoarthritis, unspecified site: Secondary | ICD-10-CM | POA: Diagnosis not present

## 2019-02-23 DIAGNOSIS — Z85828 Personal history of other malignant neoplasm of skin: Secondary | ICD-10-CM | POA: Diagnosis not present

## 2019-02-23 DIAGNOSIS — J189 Pneumonia, unspecified organism: Secondary | ICD-10-CM | POA: Diagnosis not present

## 2019-02-23 DIAGNOSIS — K219 Gastro-esophageal reflux disease without esophagitis: Secondary | ICD-10-CM | POA: Diagnosis not present

## 2019-02-23 DIAGNOSIS — E785 Hyperlipidemia, unspecified: Secondary | ICD-10-CM | POA: Diagnosis not present

## 2019-02-23 DIAGNOSIS — N183 Chronic kidney disease, stage 3 (moderate): Secondary | ICD-10-CM | POA: Diagnosis not present

## 2019-02-23 DIAGNOSIS — F5101 Primary insomnia: Secondary | ICD-10-CM | POA: Diagnosis not present

## 2019-02-23 DIAGNOSIS — G4489 Other headache syndrome: Secondary | ICD-10-CM | POA: Diagnosis not present

## 2019-02-23 DIAGNOSIS — Z950 Presence of cardiac pacemaker: Secondary | ICD-10-CM | POA: Diagnosis not present

## 2019-02-23 DIAGNOSIS — K5790 Diverticulosis of intestine, part unspecified, without perforation or abscess without bleeding: Secondary | ICD-10-CM | POA: Diagnosis not present

## 2019-02-23 DIAGNOSIS — I4819 Other persistent atrial fibrillation: Secondary | ICD-10-CM | POA: Diagnosis not present

## 2019-02-23 NOTE — Telephone Encounter (Signed)
PT went to [employer] yesterday to get some tomato plants, and after arriving at the plant shop, she noticed that she started to have a stinging sensation and a rash that showed up on her legs. She said after coming home, the rash was still present, but the stinging had gone down. After a bath she could not tell that there was ever a rash on her arms or legs.  She even called the plant store, but has not received any information yet.

## 2019-02-26 NOTE — Telephone Encounter (Signed)
Im not sure based on her history there is anything cardiac related, she could touch base with her pcp if any ongoing issues. Is there more to the story for her to contact us?    Zandra Abts MD

## 2019-02-26 NOTE — Telephone Encounter (Signed)
Follow up     Pt is still awaiting a call back    Please call

## 2019-02-27 DIAGNOSIS — N183 Chronic kidney disease, stage 3 (moderate): Secondary | ICD-10-CM | POA: Diagnosis not present

## 2019-02-27 DIAGNOSIS — K219 Gastro-esophageal reflux disease without esophagitis: Secondary | ICD-10-CM | POA: Diagnosis not present

## 2019-02-27 DIAGNOSIS — J189 Pneumonia, unspecified organism: Secondary | ICD-10-CM | POA: Diagnosis not present

## 2019-02-27 DIAGNOSIS — I4819 Other persistent atrial fibrillation: Secondary | ICD-10-CM | POA: Diagnosis not present

## 2019-02-27 DIAGNOSIS — Z7901 Long term (current) use of anticoagulants: Secondary | ICD-10-CM | POA: Diagnosis not present

## 2019-02-27 DIAGNOSIS — E46 Unspecified protein-calorie malnutrition: Secondary | ICD-10-CM | POA: Diagnosis not present

## 2019-02-27 DIAGNOSIS — I429 Cardiomyopathy, unspecified: Secondary | ICD-10-CM | POA: Diagnosis not present

## 2019-02-27 DIAGNOSIS — I5043 Acute on chronic combined systolic (congestive) and diastolic (congestive) heart failure: Secondary | ICD-10-CM | POA: Diagnosis not present

## 2019-02-27 DIAGNOSIS — E785 Hyperlipidemia, unspecified: Secondary | ICD-10-CM | POA: Diagnosis not present

## 2019-02-27 DIAGNOSIS — Z85828 Personal history of other malignant neoplasm of skin: Secondary | ICD-10-CM | POA: Diagnosis not present

## 2019-02-27 DIAGNOSIS — M1990 Unspecified osteoarthritis, unspecified site: Secondary | ICD-10-CM | POA: Diagnosis not present

## 2019-02-27 DIAGNOSIS — Z87891 Personal history of nicotine dependence: Secondary | ICD-10-CM | POA: Diagnosis not present

## 2019-02-27 DIAGNOSIS — K5790 Diverticulosis of intestine, part unspecified, without perforation or abscess without bleeding: Secondary | ICD-10-CM | POA: Diagnosis not present

## 2019-02-27 NOTE — Telephone Encounter (Signed)
Returned pt call. She seems very anxious, and confused as to what her issue was. She stated that she did not have an actual rash on her. She felt this "weird burning underneath her clothes." She was looking at plants. She was concerned that it was caused by plants, but after going home and bathing she didn't notice anything. She is going to contact her PCP if it happens again.

## 2019-03-01 DIAGNOSIS — E46 Unspecified protein-calorie malnutrition: Secondary | ICD-10-CM | POA: Diagnosis not present

## 2019-03-01 DIAGNOSIS — E785 Hyperlipidemia, unspecified: Secondary | ICD-10-CM | POA: Diagnosis not present

## 2019-03-01 DIAGNOSIS — M1990 Unspecified osteoarthritis, unspecified site: Secondary | ICD-10-CM | POA: Diagnosis not present

## 2019-03-01 DIAGNOSIS — K219 Gastro-esophageal reflux disease without esophagitis: Secondary | ICD-10-CM | POA: Diagnosis not present

## 2019-03-01 DIAGNOSIS — K5790 Diverticulosis of intestine, part unspecified, without perforation or abscess without bleeding: Secondary | ICD-10-CM | POA: Diagnosis not present

## 2019-03-01 DIAGNOSIS — J189 Pneumonia, unspecified organism: Secondary | ICD-10-CM | POA: Diagnosis not present

## 2019-03-01 DIAGNOSIS — I5043 Acute on chronic combined systolic (congestive) and diastolic (congestive) heart failure: Secondary | ICD-10-CM | POA: Diagnosis not present

## 2019-03-01 DIAGNOSIS — Z87891 Personal history of nicotine dependence: Secondary | ICD-10-CM | POA: Diagnosis not present

## 2019-03-01 DIAGNOSIS — Z85828 Personal history of other malignant neoplasm of skin: Secondary | ICD-10-CM | POA: Diagnosis not present

## 2019-03-01 DIAGNOSIS — I4819 Other persistent atrial fibrillation: Secondary | ICD-10-CM | POA: Diagnosis not present

## 2019-03-01 DIAGNOSIS — I429 Cardiomyopathy, unspecified: Secondary | ICD-10-CM | POA: Diagnosis not present

## 2019-03-01 DIAGNOSIS — N183 Chronic kidney disease, stage 3 (moderate): Secondary | ICD-10-CM | POA: Diagnosis not present

## 2019-03-01 DIAGNOSIS — Z7901 Long term (current) use of anticoagulants: Secondary | ICD-10-CM | POA: Diagnosis not present

## 2019-03-09 ENCOUNTER — Other Ambulatory Visit: Payer: Self-pay

## 2019-03-09 ENCOUNTER — Telehealth: Payer: Self-pay | Admitting: *Deleted

## 2019-03-09 ENCOUNTER — Encounter: Payer: Self-pay | Admitting: Internal Medicine

## 2019-03-09 NOTE — Telephone Encounter (Signed)
Pt verbalized consent for telehealth appt 03/13/19 with Dr Harl Bowie. Pt will have BP/HR/weight/meds available

## 2019-03-09 NOTE — Patient Outreach (Addendum)
[place] [facility]) Care Management  03/09/2019  Carol Hardin October 23, 1932 [id number]  Successful outreach with Carol Hardin. She was discharged from [place] services with nursing goals met. She reports compliance with medications and treatment recommendations. She is compliant with daily weights and monitors her vitals as recommended.  She reports the following readings: Weight-134.5lbs.  BP-102/79.  Pulse-82bpm. O2sat- 96%   Per our discussion, her activity tolerance has not changed. She continues to experience episodes of exertional dyspnea. Reports not requiring supplemental oxygen for several weeks. Reports ambulating well. Denies edema to her lower extremities.  New Concerns: Carol Hardin reports several new concerns since our conversation earlier this month. States she was waiting to discuss them during her scheduled outreach with Dr. Harl Bowie. She was encouraged to address non cardiac related concerns with her PCP or the assigned specialist. She is agreeable to me contacting her providers at the completion of this call.  Skin Irritation-Reports developing a rash to her lower extremities shortly after entering a plant store. Denies other symptoms. Today she reports the rash has resolved. States her skin still feels irritated. I will contact Dr. Juel Burrow office to arrange outreach.   "Glare" when removing eyeglasses-Reports noticeable glare when removing eyewear. She denies visual changes when actually wearing the eyeglasses. She denies eye pain but describes the glare as bothersome. She has a scheduled eye exam in July. Will contact the office to determine if she can be evaluated earlier.  Headaches-Reports occasional headaches that are not easily relieved with Tylenol. She does not experience visual changes, weakness or dizziness when the headaches occur. She did not feel that urgent evaluation was needed. She would like to discuss options for pain relief.  I will  contact Dr. Juel Burrow office to arrange outreach.  We thoroughly discussed worsening s/sx along with indications for seeking immediate medical attention. Carol Hardin denies urgent needs. States she will seek medical attention if her symptoms worsen.  PLAN -Will contact Dr. Juel Burrow office to arrange outreach. -Will attempt to reschedule eye exam for earlier evaluation. -Will follow up with Carol Hardin on Monday.   [place] [phone]

## 2019-03-12 ENCOUNTER — Other Ambulatory Visit: Payer: Self-pay

## 2019-03-12 DIAGNOSIS — H2512 Age-related nuclear cataract, left eye: Secondary | ICD-10-CM | POA: Diagnosis not present

## 2019-03-12 DIAGNOSIS — H524 Presbyopia: Secondary | ICD-10-CM | POA: Diagnosis not present

## 2019-03-12 DIAGNOSIS — H02052 Trichiasis without entropian right lower eyelid: Secondary | ICD-10-CM | POA: Diagnosis not present

## 2019-03-12 DIAGNOSIS — H04123 Dry eye syndrome of bilateral lacrimal glands: Secondary | ICD-10-CM | POA: Diagnosis not present

## 2019-03-12 NOTE — Patient Outreach (Signed)
[place] [facility]) Care Management  03/12/2019  Carol Hardin 09-21-1933 [id number]  Follow up regarding outreach on Friday 03/09/19.  Confirmed [name]'s availability for the following appointments: -Eye exam rescheduled. Previously scheduled for July. She will be evaluated today. -Telehealth visit with Dr. [name] or designated NP on 03/13/19 at 3:00pm  [name] will complete telehealth visit with Dr. Harl Bowie as scheduled.   PLAN Will continue routine outreach.  [place] [phone]

## 2019-03-13 ENCOUNTER — Encounter: Payer: Self-pay | Admitting: Cardiology

## 2019-03-13 ENCOUNTER — Telehealth (INDEPENDENT_AMBULATORY_CARE_PROVIDER_SITE_OTHER): Payer: Medicare Other | Admitting: Cardiology

## 2019-03-13 VITALS — Ht 65.0 in | Wt 134.5 lb

## 2019-03-13 DIAGNOSIS — F5101 Primary insomnia: Secondary | ICD-10-CM | POA: Diagnosis not present

## 2019-03-13 DIAGNOSIS — I495 Sick sinus syndrome: Secondary | ICD-10-CM

## 2019-03-13 DIAGNOSIS — I5022 Chronic systolic (congestive) heart failure: Secondary | ICD-10-CM

## 2019-03-13 DIAGNOSIS — G4489 Other headache syndrome: Secondary | ICD-10-CM | POA: Diagnosis not present

## 2019-03-13 DIAGNOSIS — Z950 Presence of cardiac pacemaker: Secondary | ICD-10-CM

## 2019-03-13 DIAGNOSIS — R600 Localized edema: Secondary | ICD-10-CM | POA: Diagnosis not present

## 2019-03-13 NOTE — Patient Instructions (Signed)
Medication Instructions:  Continue all current medications.  Labwork: none  Testing/Procedures: none  Follow-Up: 2 months   Any Other Special Instructions Will Be Listed Below (If Applicable).  If you need a refill on your cardiac medications before your next appointment, please call your pharmacy.  

## 2019-03-13 NOTE — Progress Notes (Signed)
Virtual Visit via Telephone Note   This visit type was conducted due to national recommendations for restrictions regarding the COVID-19 Pandemic (e.g. social distancing) in an effort to limit this patient's exposure and mitigate transmission in our community.  Due to her co-morbid illnesses, this patient is at least at moderate risk for complications without adequate follow up.  This format is felt to be most appropriate for this patient at this time.  The patient did not have access to video technology/had technical difficulties with video requiring transitioning to audio format only (telephone).  All issues noted in this document were discussed and addressed.  No physical exam could be performed with this format.  Please refer to the patient's chart for her  consent to telehealth for [facility].   Date:  03/13/2019   ID:  Carol Hardin, DOB 14-Mar-1933, MRN [id number]  Patient Location: Home Provider Location: Home  PCP:  Celene Squibb, MD  Cardiologist:  Carlyle Dolly, MD  Electrophysiologist:  Thompson Grayer, MD   Evaluation Performed:  Follow-Up Visit  Chief Complaint:  Heart failure  History of Present Illness:    Carol Hardin is a 85 y.o. female seen today for follow up of the following medical problems.  1. Afib with Tachy-brady syndrome - s/p av nodal ablation and pacemaker placement - doing well without symptoms.    2.Chronicsystolic HF - LVE 96-78%, moderate RV dysfunction, moderate pleural effusion, moderaet MR - LVEF 2018 55-60%. Perhaps tachy medicated CM - volume overloaded during recent admission, diuresed. Did not requrie discharge diuretic, remains euvolemic. Suspect decompensation due to pneumonia and afib with RVR. - bp's too soft to consider additional CHF meds   - no recent edema.  - weight 134 lbs, down from 137 lbs last visit - compliant with meds. Aggressively cut back her sodium intake. Takes lasix prn.      3. Generalized  anxiety disorder - on ativan at home - quite anxious in general about medications, medical procedures, and her overall health.       The patient does not have symptoms concerning for COVID-19 infection (fever, chills, cough, or new shortness of breath).    Past Medical History:  Diagnosis Date  . Anxiety   . Arthritis    "fingers, right toe" (08/09/2016)  . Basal cell carcinoma of lower leg, right   . Bradycardia    a. Holter 08/30/16 showed profound bradycardia down to 30 during awake hours, several 2 second pauses, very frequent PVCs >8000 in 48 hours, and NSVT (longest of 14 beats).  . Cardiomyopathy ([place])    a. EF 40% by echo 08/2016.  . Daily headache    "I usually wake up w/a headache; sometimes it's a migraine" (08/09/2016)  . Depression   . Diverticulosis    Hx. of  . Frequent PVCs   . GERD (gastroesophageal reflux disease)   . Hypercholesterolemia   . Migraine   . Mitral regurgitation   . MVP (mitral valve prolapse)   . NSVT (nonsustained ventricular tachycardia) ([place])    a. first noted event monitor 08/2016.  . Osteoporosis   . PAF (paroxysmal atrial fibrillation) ([place])    a. in afib at time of echo 08/2016.  Marland Kitchen Pneumonia   . Scoliosis    mild  . Squamous cell carcinoma of neck    Past Surgical History:  Procedure Laterality Date  . AV NODE ABLATION N/A 01/23/2019   Procedure: AV NODE ABLATION;  Surgeon: Thompson Grayer, MD;  Location:  [place] INVASIVE CV LAB;  Service: Cardiovascular;  Laterality: N/A;  . BASAL CELL CARCINOMA EXCISION Right    RLE  . BIV PACEMAKER INSERTION CRT-P N/A 01/23/2019   Procedure: BIV PACEMAKER INSERTION CRT-P;  Surgeon: Thompson Grayer, MD;  Location: [place] CV LAB;  Service: Cardiovascular;  Laterality: N/A;  . BLEPHAROPLASTY    . BREAST BIOPSY Left   . BREAST CYST ASPIRATION Left   . BREAST CYST EXCISION Left   . BUNIONECTOMY Bilateral   . DILATION AND CURETTAGE OF UTERUS    . EXCISIONAL HEMORRHOIDECTOMY    . INTRAMEDULLARY  (IM) NAIL INTERTROCHANTERIC Left 12/31/2017   Procedure: INTRAMEDULLARY (IM) NAIL INTERTROCHANTRIC;  Surgeon: Nicholes Stairs, MD;  Location: [place];  Service: Orthopedics;  Laterality: Left;  . KNEE ARTHROSCOPY Right 04/12/2007  . KNEE ARTHROSCOPY Left   . SQUAMOUS CELL CARCINOMA EXCISION     "neck"  . TONSILLECTOMY       Current Meds  Medication Sig  . acetaminophen (TYLENOL) 325 MG tablet Take 2 tablets (650 mg total) by mouth every 4 (four) hours as needed for headache or mild pain.  Marland Kitchen apixaban (ELIQUIS) 5 MG TABS tablet Take 1 tablet (5 mg total) by mouth 2 (two) times daily. Please hold until Friday afternoon  . busPIRone (BUSPAR) 5 MG tablet Take 5 mg by mouth 3 (three) times daily as needed (anxiety).  . Emollient ([place] [employer] HAND & BODY) LOTN Apply 1 application topically daily as needed.  . Ensure (ENSURE) Take 237 mLs by mouth 2 (two) times daily between meals.  . famotidine (PEPCID) 20 MG tablet Take 20 mg by mouth daily.  . furosemide (LASIX) 20 MG tablet Take 1 tablet (20 mg total) by mouth every Tuesday, Thursday, Saturday, and Sunday.  Marland Kitchen guaiFENesin (MUCINEX) 600 MG 12 hr tablet Take 600 mg by mouth as needed for cough. Takes PRN for congested cough. Purchased OTC. Has been taking daily around lunch.  Marland Kitchen LORazepam (ATIVAN) 1 MG tablet Take 1 mg by mouth at bedtime.  . polyethylene glycol (MIRALAX / GLYCOLAX) packet Take 17 g by mouth daily as needed for mild constipation.      Allergies:   Omeprazole; Flagyl [metronidazole]; Aripiprazole; Hylan g-f 20; Paroxetine hcl; Statins; Sulfa antibiotics; Toprol xl [metoprolol succinate]; Vancomycin; and Penicillins   Social History   Tobacco Use  . Smoking status: Former Smoker    Packs/day: 1.00    Years: 20.00    Pack years: 20.00    Types: Cigarettes    Last attempt to quit: 1989    Years since quitting: 31.4  . Smokeless tobacco: Never Used  Substance Use Topics  . Alcohol use: No  . Drug use: No     Family Hx:  The patient's family history includes Heart failure in her father and mother; Leukemia in her mother.  ROS:   Please see the history of present illness.     All other systems reviewed and are negative.   Prior CV studies:   The following studies were reviewed today:  11/2018 echo IMPRESSIONS   1. The left ventricle has mild-moderately reduced systolic function of 56-31%. The cavity size was normal. There is concentric left ventricular hypertrophy. Left ventricular diastology could not be evaluated secondary to atrial fibrillation. Elevated  left ventricular end-diastolic pressure Left ventricular diffuse hypokinesis. 2. The right ventricle has moderately reduced systolic function. The cavity was mildly enlarged. There is no increase in right ventricular wall thickness. 3. Left atrial size was mildly dilated.  4. Right atrial size was moderately dilated. 5. Moderate pleural effusion in the left lateral region. 6. The mitral valve is myxomatous. There is mild thickening. Mitral valve regurgitation is moderate by color flow Doppler. 7. The tricuspid valve is normal in structure. 8. The aortic valve is tricuspid Aortic valve regurgitation is mild by color flow Doppler. 9. There is dilatation of the aortic root. 10. The inferior vena cava was dilated in size with >50% respiratory variability. Labs/Other Tests and Data Reviewed:    EKG:  No ECG reviewed.  Recent Labs: 11/18/2018: TSH 3.484 01/20/2019: B Natriuretic Peptide 1,638.0 01/22/2019: ALT 31 01/23/2019: Magnesium 2.3 01/24/2019: BUN 18; Creatinine, Ser 1.21; Hemoglobin 13.0; Platelets 171; Potassium 3.9; Sodium 137   Recent Lipid Panel Lab Results  Component Value Date/Time   CHOL 140 01/23/2019 06:31 AM   TRIG 64 01/23/2019 06:31 AM   HDL 42 01/23/2019 06:31 AM   CHOLHDL 3.3 01/23/2019 06:31 AM   LDLCALC 85 01/23/2019 06:31 AM    Wt Readings from Last 3 Encounters:  03/13/19 134 lb 8 oz (61 kg)  02/21/19 136 lb  9.6 oz (62 kg)  01/24/19 136 lb 11 oz (62 kg)     Objective:    Vital Signs:  Ht 5\' 5"  (1.651 m)   Wt 134 lb 8 oz (61 kg)   BMI 22.38 kg/m    Today's Vitals   03/13/19 0953  Weight: 134 lb 8 oz (61 kg)  Height: 5\' 5"  (1.651 m)   Body mass index is 22.38 kg/m.  Normal affect. Normal speech pattern and tone. Comfortable, no apparent distress. No audible signs of SOB or wheezing.   ASSESSMENT & PLAN:    1. Afib with Tachy-brady syndrome S/p av nodal ablation with pacemaker placement - doing well without symptoms, continue current meds   2. Chronic systolic HF - weights are stable, she made significant changes in her diet. I think since she is not having afib with RVR it has helped stabilize her HF as well. Needing her lasix pretty infrequently, will take just prn weight above 137 ls or for LE edema.  - may recheck LVEF in future now that afib with RVR not an issue, think her LVEF drop may have been tachy mediated     COVID-19 Education: The signs and symptoms of COVID-19 were discussed with the patient and how to seek care for testing (follow up with PCP or arrange E-visit).  The importance of social distancing was discussed today.  Time:   Today, I have spent 12 minutes with the patient with telehealth technology discussing the above problems.     Medication Adjustments/Labs and Tests Ordered: Current medicines are reviewed at length with the patient today.  Concerns regarding medicines are outlined above.   Tests Ordered: No orders of the defined types were placed in this encounter.   Medication Changes: No orders of the defined types were placed in this encounter.   Disposition:  Follow up 2 months  Signed, Carlyle Dolly, MD  03/13/2019 1:06 PM    [place]

## 2019-03-14 DIAGNOSIS — R0902 Hypoxemia: Secondary | ICD-10-CM | POA: Diagnosis not present

## 2019-03-14 DIAGNOSIS — R0602 Shortness of breath: Secondary | ICD-10-CM | POA: Diagnosis not present

## 2019-03-14 DIAGNOSIS — I5020 Unspecified systolic (congestive) heart failure: Secondary | ICD-10-CM | POA: Diagnosis not present

## 2019-03-14 DIAGNOSIS — R0600 Dyspnea, unspecified: Secondary | ICD-10-CM | POA: Diagnosis not present

## 2019-03-16 ENCOUNTER — Other Ambulatory Visit: Payer: Self-pay

## 2019-03-16 NOTE — Patient Outreach (Signed)
[facility]) Care Management  03/16/2019  Carol Hardin [birth year] [id number]  Follow-up to confirm completion of scheduled MD appointments. [name] was able to complete the telehealth and office visits as scheduled. Reports no medication changes were made during her primary care visit. Reviewed recommendations discussed during her outreach with Dr. Harl Bowie. Encouraged to continue monitoring and recording daily weights. She is aware that Lasix 20mg  should be taken as needed for lower extremity edema or weight greater than 137lbs.   Pending Appointments: She will complete her dental exam with Dr. Delcie Roch as scheduled on 03/20/19.  PLAN -Will follow-up later this month.  [place] [phone]

## 2019-03-27 DIAGNOSIS — R1030 Lower abdominal pain, unspecified: Secondary | ICD-10-CM | POA: Diagnosis not present

## 2019-03-27 DIAGNOSIS — R350 Frequency of micturition: Secondary | ICD-10-CM | POA: Diagnosis not present

## 2019-03-28 ENCOUNTER — Other Ambulatory Visit: Payer: Self-pay

## 2019-03-28 ENCOUNTER — Telehealth: Payer: Self-pay

## 2019-03-28 ENCOUNTER — Encounter (HOSPITAL_COMMUNITY): Payer: Self-pay | Admitting: Emergency Medicine

## 2019-03-28 ENCOUNTER — Emergency Department (HOSPITAL_COMMUNITY): Payer: Medicare Other

## 2019-03-28 ENCOUNTER — Encounter (HOSPITAL_COMMUNITY): Payer: Self-pay

## 2019-03-28 ENCOUNTER — Emergency Department (HOSPITAL_COMMUNITY)
Admission: EM | Admit: 2019-03-28 | Discharge: 2019-03-28 | Disposition: A | Discharge status: Home / Self Care | Payer: Medicare Other | Attending: Emergency Medicine | Admitting: Emergency Medicine

## 2019-03-28 ENCOUNTER — Emergency Department (HOSPITAL_COMMUNITY)
Admission: EM | Admit: 2019-03-28 | Discharge: 2019-03-28 | Disposition: A | Discharge status: Home / Self Care | Payer: Medicare Other | Source: Home / Self Care

## 2019-03-28 DIAGNOSIS — I480 Paroxysmal atrial fibrillation: Secondary | ICD-10-CM | POA: Insufficient documentation

## 2019-03-28 DIAGNOSIS — N183 Chronic kidney disease, stage 3 (moderate): Secondary | ICD-10-CM | POA: Diagnosis not present

## 2019-03-28 DIAGNOSIS — Z79899 Other long term (current) drug therapy: Secondary | ICD-10-CM | POA: Diagnosis not present

## 2019-03-28 DIAGNOSIS — Z950 Presence of cardiac pacemaker: Secondary | ICD-10-CM | POA: Insufficient documentation

## 2019-03-28 DIAGNOSIS — R1012 Left upper quadrant pain: Secondary | ICD-10-CM | POA: Diagnosis not present

## 2019-03-28 DIAGNOSIS — Z880 Allergy status to penicillin: Secondary | ICD-10-CM | POA: Diagnosis not present

## 2019-03-28 DIAGNOSIS — Z85828 Personal history of other malignant neoplasm of skin: Secondary | ICD-10-CM | POA: Diagnosis not present

## 2019-03-28 DIAGNOSIS — Z03818 Encounter for observation for suspected exposure to other biological agents ruled out: Secondary | ICD-10-CM | POA: Diagnosis not present

## 2019-03-28 DIAGNOSIS — K5900 Constipation, unspecified: Secondary | ICD-10-CM | POA: Insufficient documentation

## 2019-03-28 DIAGNOSIS — Z882 Allergy status to sulfonamides status: Secondary | ICD-10-CM | POA: Diagnosis not present

## 2019-03-28 DIAGNOSIS — R109 Unspecified abdominal pain: Secondary | ICD-10-CM | POA: Diagnosis not present

## 2019-03-28 DIAGNOSIS — I5033 Acute on chronic diastolic (congestive) heart failure: Secondary | ICD-10-CM | POA: Diagnosis not present

## 2019-03-28 DIAGNOSIS — I130 Hypertensive heart and chronic kidney disease with heart failure and stage 1 through stage 4 chronic kidney disease, or unspecified chronic kidney disease: Secondary | ICD-10-CM | POA: Insufficient documentation

## 2019-03-28 DIAGNOSIS — Z881 Allergy status to other antibiotic agents status: Secondary | ICD-10-CM | POA: Insufficient documentation

## 2019-03-28 DIAGNOSIS — Z888 Allergy status to other drugs, medicaments and biological substances status: Secondary | ICD-10-CM | POA: Diagnosis not present

## 2019-03-28 DIAGNOSIS — K219 Gastro-esophageal reflux disease without esophagitis: Secondary | ICD-10-CM | POA: Diagnosis not present

## 2019-03-28 DIAGNOSIS — R5381 Other malaise: Secondary | ICD-10-CM | POA: Diagnosis not present

## 2019-03-28 DIAGNOSIS — R1032 Left lower quadrant pain: Secondary | ICD-10-CM | POA: Diagnosis not present

## 2019-03-28 DIAGNOSIS — Z7901 Long term (current) use of anticoagulants: Secondary | ICD-10-CM | POA: Insufficient documentation

## 2019-03-28 DIAGNOSIS — Z87891 Personal history of nicotine dependence: Secondary | ICD-10-CM | POA: Diagnosis not present

## 2019-03-28 DIAGNOSIS — I495 Sick sinus syndrome: Secondary | ICD-10-CM | POA: Diagnosis not present

## 2019-03-28 LAB — CBC WITH DIFFERENTIAL/PLATELET
Abs Immature Granulocytes: 0.02 10*3/uL (ref 0.00–0.07)
Basophils Absolute: 0.1 10*3/uL (ref 0.0–0.1)
Basophils Relative: 1 %
Eosinophils Absolute: 0.6 10*3/uL — ABNORMAL HIGH (ref 0.0–0.5)
Eosinophils Relative: 8 %
HCT: 43.4 % (ref 36.0–46.0)
Hemoglobin: 13.7 g/dL (ref 12.0–15.0)
Immature Granulocytes: 0 %
Lymphocytes Relative: 29 %
Lymphs Abs: 2.2 10*3/uL (ref 0.7–4.0)
MCH: 31.9 pg (ref 26.0–34.0)
MCHC: 31.6 g/dL (ref 30.0–36.0)
MCV: 100.9 fL — ABNORMAL HIGH (ref 80.0–100.0)
Monocytes Absolute: 0.8 10*3/uL (ref 0.1–1.0)
Monocytes Relative: 10 %
Neutro Abs: 3.9 10*3/uL (ref 1.7–7.7)
Neutrophils Relative %: 52 %
Platelets: 211 10*3/uL (ref 150–400)
RBC: 4.3 MIL/uL (ref 3.87–5.11)
RDW: 14.8 % (ref 11.5–15.5)
WBC: 7.6 10*3/uL (ref 4.0–10.5)
nRBC: 0 % (ref 0.0–0.2)

## 2019-03-28 LAB — COMPREHENSIVE METABOLIC PANEL
ALT: 26 U/L (ref 0–44)
AST: 44 U/L — ABNORMAL HIGH (ref 15–41)
Albumin: 4.1 g/dL (ref 3.5–5.0)
Alkaline Phosphatase: 64 U/L (ref 38–126)
Anion gap: 9 (ref 5–15)
BUN: 24 mg/dL — ABNORMAL HIGH (ref 8–23)
CO2: 23 mmol/L (ref 22–32)
Calcium: 9.3 mg/dL (ref 8.9–10.3)
Chloride: 105 mmol/L (ref 98–111)
Creatinine, Ser: 0.97 mg/dL (ref 0.44–1.00)
GFR calc Af Amer: >60 mL/min (ref >60)
GFR calc non Af Amer: 54 mL/min — ABNORMAL LOW (ref >60)
Glucose, Bld: 95 mg/dL (ref 70–99)
Potassium: 5.8 mmol/L — ABNORMAL HIGH (ref 3.5–5.1)
Sodium: 137 mmol/L (ref 135–145)
Total Bilirubin: 1.7 mg/dL — ABNORMAL HIGH (ref 0.3–1.2)
Total Protein: 7.5 g/dL (ref 6.5–8.1)

## 2019-03-28 LAB — URINALYSIS, ROUTINE W REFLEX MICROSCOPIC
Bilirubin Urine: NEGATIVE
Glucose, UA: NEGATIVE mg/dL
Hgb urine dipstick: NEGATIVE
Ketones, ur: NEGATIVE mg/dL
Leukocytes,Ua: NEGATIVE
Nitrite: NEGATIVE
Protein, ur: NEGATIVE mg/dL
Specific Gravity, Urine: 1.029 (ref 1.005–1.030)
pH: 5 (ref 5.0–8.0)

## 2019-03-28 LAB — POTASSIUM: Potassium: 4.7 mmol/L (ref 3.5–5.1)

## 2019-03-28 MED ORDER — ONDANSETRON HCL 4 MG/2ML IJ SOLN
4.0000 mg | Freq: Once | INTRAMUSCULAR | Status: AC
  Administered 2019-03-28: 4 mg via INTRAVENOUS
  Filled 2019-03-28: qty 2

## 2019-03-28 MED ORDER — LACTATED RINGERS IV BOLUS
1000.0000 mL | Freq: Once | INTRAVENOUS | Status: AC
  Administered 2019-03-28: 1000 mL via INTRAVENOUS

## 2019-03-28 MED ORDER — IOHEXOL 300 MG/ML  SOLN
100.0000 mL | Freq: Once | INTRAMUSCULAR | Status: AC | PRN
  Administered 2019-03-28: 100 mL via INTRAVENOUS

## 2019-03-28 MED ORDER — FENTANYL CITRATE (PF) 100 MCG/2ML IJ SOLN
50.0000 ug | Freq: Once | INTRAMUSCULAR | Status: AC
  Administered 2019-03-28: 50 ug via INTRAVENOUS
  Filled 2019-03-28: qty 2

## 2019-03-28 MED ORDER — SODIUM CHLORIDE 0.9 % IV BOLUS
1000.0000 mL | Freq: Once | INTRAVENOUS | Status: AC
  Administered 2019-03-28: 1000 mL via INTRAVENOUS

## 2019-03-28 NOTE — Telephone Encounter (Signed)
Spoke with pt regarding covid-19 screening prior to appt. Pt stated she has not been in contact with anyone who may have covid-19 and has no symptoms.

## 2019-03-28 NOTE — ED Triage Notes (Signed)
Patient reports 5 lb weight gain in last 24 hours, states she is supposed to be checked because she has a history of CHF.

## 2019-03-28 NOTE — ED Triage Notes (Signed)
Pt reports left flank pain that started two days ago, pt saw Dr [name] (PCP) and was prescribed Macrobid for UTI. Pt reports pain is still present and getting worse. Pt also reports constipation. Pt also reports pain across lower back. Pt reports decreased output and urgency.

## 2019-03-28 NOTE — ED Provider Notes (Signed)
[facility] EMERGENCY DEPARTMENT Provider Note   CSN: [id number] Arrival date & time: 03/28/19  0026    History   Chief Complaint Chief Complaint  Patient presents with  . Flank Pain    left    HPI Carol Hardin is a 85 y.o. female.     Flank Pain This is a recurrent problem. The current episode started 12 to 24 hours ago. The problem occurs constantly. The problem has been gradually worsening. Associated symptoms include abdominal pain. Pertinent negatives include no chest pain and no headaches. Nothing aggravates the symptoms. Nothing relieves the symptoms. She has tried nothing for the symptoms. The treatment provided no relief.    Past Medical History:  Diagnosis Date  . Anxiety   . Arthritis    "fingers, right toe" (08/09/2016)  . Basal cell carcinoma of lower leg, right   . Bradycardia    a. Holter 08/30/16 showed profound bradycardia down to 30 during awake hours, several 2 second pauses, very frequent PVCs >8000 in 48 hours, and NSVT (longest of 14 beats).  . Cardiomyopathy ([place])    a. EF 40% by echo 08/2016.  . Daily headache    "I usually wake up w/a headache; sometimes it's a migraine" (08/09/2016)  . Depression   . Diverticulosis    Hx. of  . Frequent PVCs   . GERD (gastroesophageal reflux disease)   . Hypercholesterolemia   . Migraine   . Mitral regurgitation   . MVP (mitral valve prolapse)   . NSVT (nonsustained ventricular tachycardia) ([place])    a. first noted event monitor 08/2016.  . Osteoporosis   . PAF (paroxysmal atrial fibrillation) ([place])    a. in afib at time of echo 08/2016.  Marland Kitchen Pneumonia   . Scoliosis    mild  . Squamous cell carcinoma of neck     Patient Active Problem List   Diagnosis Date Noted  . Cardiac device in situ   . Chronic respiratory failure with hypoxia ([place])   . On home O2   . Sick sinus syndrome ([place]) 01/22/2019  . CKD (chronic kidney disease) stage 3, GFR 30-59 ml/min ([facility]) 01/21/2019  . Atrial fibrillation  with RVR ([place]) 01/20/2019  . Acute on chronic systolic CHF (congestive heart failure) ([place]) 12/25/2018  . Tachy-brady syndrome ([place]) 12/25/2018  . Atrial fibrillation, chronic 12/24/2018  . Congestive heart failure with left ventricular diastolic dysfunction, unspecified failure chronicity ([place]) 12/24/2018  . Malnutrition of moderate degree 11/20/2018  . Atrial fibrillation with rapid ventricular response ([place]) 11/18/2018  . GERD (gastroesophageal reflux disease) 11/18/2018  . CAP (community acquired pneumonia) 11/18/2018  . Acute on chronic diastolic HF (heart failure) ([place]) 11/18/2018  . Stroke (cerebrum) ([place]) 05/19/2018  . Hip fracture ([place]) 12/31/2017  . PAF (paroxysmal atrial fibrillation) ([place])   . NSVT (nonsustained ventricular tachycardia) ([place])   . Mitral regurgitation   . Frequent PVCs   . Bradycardia   . Essential hypertension   . Hypokalemia   . Diverticulitis 08/09/2016  . Diverticulosis of colon 04/22/2016  . Diverticulosis of large intestine without perforation or abscess without bleeding 04/22/2016  . Generalized anxiety disorder 04/22/2016  . Irritable bowel syndrome without diarrhea 04/22/2016  . Mitral valve prolapse 04/22/2016  . Mixed hyperlipidemia 04/22/2016  . Osteopenia 04/22/2016  . Low blood pressure 09/24/2014  . Hypercholesterolemia   . History of depression   . Scoliosis   . Osteoporosis   . DYSPNEA 05/31/2008    Past Surgical History:  Procedure Laterality Date  .  AV NODE ABLATION N/A 01/23/2019   Procedure: AV NODE ABLATION;  Surgeon: Thompson Grayer, MD;  Location: [place] CV LAB;  Service: Cardiovascular;  Laterality: N/A;  . BASAL CELL CARCINOMA EXCISION Right    RLE  . BIV PACEMAKER INSERTION CRT-P N/A 01/23/2019   Procedure: BIV PACEMAKER INSERTION CRT-P;  Surgeon: Thompson Grayer, MD;  Location: [place] CV LAB;  Service: Cardiovascular;  Laterality: N/A;  . BLEPHAROPLASTY    . BREAST BIOPSY Left   . BREAST CYST ASPIRATION Left   .  BREAST CYST EXCISION Left   . BUNIONECTOMY Bilateral   . DILATION AND CURETTAGE OF UTERUS    . EXCISIONAL HEMORRHOIDECTOMY    . INTRAMEDULLARY (IM) NAIL INTERTROCHANTERIC Left 12/31/2017   Procedure: INTRAMEDULLARY (IM) NAIL INTERTROCHANTRIC;  Surgeon: Nicholes Stairs, MD;  Location: [place];  Service: Orthopedics;  Laterality: Left;  . KNEE ARTHROSCOPY Right 04/12/2007  . KNEE ARTHROSCOPY Left   . SQUAMOUS CELL CARCINOMA EXCISION     "neck"  . TONSILLECTOMY       OB History    Gravida  6   Para  4   Term  4   Preterm      AB  2   Living        SAB  2   TAB      Ectopic      Multiple      Live Births               Home Medications    Prior to Admission medications   Medication Sig Start Date End Date Taking? Authorizing Provider  acetaminophen (TYLENOL) 325 MG tablet Take 2 tablets (650 mg total) by mouth every 4 (four) hours as needed for headache or mild pain. 12/29/18   [name], MD  apixaban (ELIQUIS) 5 MG TABS tablet Take 1 tablet (5 mg total) by mouth 2 (two) times daily. Please hold until Friday afternoon 01/26/19   Florencia Reasons, MD  busPIRone (BUSPAR) 5 MG tablet Take 5 mg by mouth 3 (three) times daily as needed (anxiety).    [provider]  Emollient ([place] [employer] HAND & BODY) LOTN Apply 1 application topically daily as needed.    [provider]  Ensure (ENSURE) Take 237 mLs by mouth 2 (two) times daily between meals.    [provider]  famotidine (PEPCID) 20 MG tablet Take 20 mg by mouth daily.    [provider]  furosemide (LASIX) 20 MG tablet Take 1 tablet (20 mg total) by mouth every Tuesday, Thursday, Saturday, and Sunday. 12/30/18   [name], MD  guaiFENesin (MUCINEX) 600 MG 12 hr tablet Take 600 mg by mouth as needed for cough. Takes PRN for congested cough. Purchased OTC. Has been taking daily around lunch.    [provider]  LORazepam (ATIVAN) 1 MG tablet Take 1 mg by mouth at  bedtime.    [provider]  polyethylene glycol (MIRALAX / GLYCOLAX) packet Take 17 g by mouth daily as needed for mild constipation.     [provider]    Family History Family History  Problem Relation Age of Onset  . Heart failure Mother   . Leukemia Mother   . Heart failure Father     Social History Social History   Tobacco Use  . Smoking status: Former Smoker    Packs/day: 1.00    Years: 20.00    Pack years: 20.00    Types: Cigarettes  Quit date: 74    Years since quitting: 31.4  . Smokeless tobacco: Never Used  Substance Use Topics  . Alcohol use: No  . Drug use: No     Allergies   Omeprazole, Flagyl [metronidazole], Aripiprazole, Hylan g-f 20, Paroxetine hcl, Statins, Sulfa antibiotics, Toprol xl [metoprolol succinate], Vancomycin, and Penicillins   Review of Systems Review of Systems  Cardiovascular: Negative for chest pain.  Gastrointestinal: Positive for abdominal pain and constipation.  Genitourinary: Positive for flank pain. Negative for decreased urine volume, dysuria, frequency, vaginal bleeding, vaginal discharge and vaginal pain.  Neurological: Negative for headaches.  All other systems reviewed and are negative.    Physical Exam Updated Vital Signs BP 111/76   Pulse 70   Temp 98.1 F (36.7 C)   Resp 15   Ht 5\' 5"  (1.651 m)   Wt 61.2 kg   SpO2 98%   BMI 22.47 kg/m   Physical Exam Vitals signs and nursing note reviewed.  Constitutional:      Appearance: She is well-developed.  HENT:     Head: Normocephalic and atraumatic.     Mouth/Throat:     Mouth: Mucous membranes are moist.  Eyes:     Extraocular Movements: Extraocular movements intact.     Conjunctiva/sclera: Conjunctivae normal.  Neck:     Musculoskeletal: Normal range of motion.  Cardiovascular:     Rate and Rhythm: Normal rate and regular rhythm.  Pulmonary:     Effort: No respiratory distress.     Breath sounds: No stridor.  Abdominal:      General: There is no distension.  Musculoskeletal: Normal range of motion.        General: No swelling or tenderness.  Skin:    General: Skin is warm and dry.     Findings: No rash.  Neurological:     General: No focal deficit present.     Mental Status: She is alert.      ED Treatments / Results  Labs (all labs ordered are listed, but only abnormal results are displayed) Labs Reviewed  CBC WITH DIFFERENTIAL/PLATELET - Abnormal; Notable for the following components:      Result Value   MCV 100.9 (*)    Eosinophils Absolute 0.6 (*)    All other components within normal limits  COMPREHENSIVE METABOLIC PANEL - Abnormal; Notable for the following components:   Potassium 5.8 (*)    BUN 24 (*)    AST 44 (*)    Total Bilirubin 1.7 (*)    GFR calc non Af Amer 54 (*)    All other components within normal limits  NOVEL CORONAVIRUS, NAA (HOSPITAL ORDER, SEND-OUT TO REF LAB)  URINE CULTURE  URINALYSIS, ROUTINE W REFLEX MICROSCOPIC  POTASSIUM    EKG EKG Interpretation  Date/Time:  Wednesday March 28 2019 00:46:38 EDT Ventricular Rate:  80 PR Interval:    QRS Duration: 130 QT Interval:  396 QTC Calculation: 519 R Axis:   -65 Text Interpretation:  Ventricular-paced complexes No further analysis attempted due to paced rhythm some paced, some  PVC's Confirmed by Merrily Pew [phone]) on 03/28/2019 1:23:37 AM   Radiology Ct Abdomen Pelvis W Contrast  Result Date: 03/28/2019 CLINICAL DATA:  Left flank pain, constipation EXAM: CT ABDOMEN AND PELVIS WITH CONTRAST TECHNIQUE: Multidetector CT imaging of the abdomen and pelvis was performed using the standard protocol following bolus administration of intravenous contrast. CONTRAST:  170mL OMNIPAQUE IOHEXOL 300 MG/ML  SOLN COMPARISON:  08/19/2017 FINDINGS: Lower chest: Cardiomegaly. Bibasilar atelectasis  or scarring. No effusions. Hepatobiliary: No focal hepatic abnormality. Gallbladder unremarkable. Pancreas: No focal abnormality or ductal  dilatation. Spleen: No focal abnormality.  Normal size. Adrenals/Urinary Tract: No adrenal abnormality. Small cyst in the upper pole of the left kidney. No stones or hydronephrosis. Urinary bladder is unremarkable. Stomach/Bowel: Moderate stool burden throughout the colon. Stomach, large and small bowel grossly unremarkable. Normal appendix. Vascular/Lymphatic: Aortic atherosclerosis. No enlarged abdominal or pelvic lymph nodes. Reproductive: Uterus and adnexa unremarkable.  No mass. Other: No free fluid or free air. Musculoskeletal: No acute bony abnormality. IMPRESSION: Cardiomegaly, aortic atherosclerosis. Moderate stool burden throughout the colon. No renal or ureteral stones.  No hydronephrosis. Bibasilar atelectasis or scarring. Electronically Signed   By: Rolm Baptise M.D.   On: 03/28/2019 02:21    Procedures Procedures (including critical care time)  Medications Ordered in ED Medications  fentaNYL (SUBLIMAZE) injection 50 mcg (50 mcg Intravenous Given 03/28/19 0116)  lactated ringers bolus 1,000 mL (0 mLs Intravenous Stopped 03/28/19 0230)  ondansetron (ZOFRAN) injection 4 mg (4 mg Intravenous Given 03/28/19 0117)  iohexol (OMNIPAQUE) 300 MG/ML solution 100 mL (100 mLs Intravenous Contrast Given 03/28/19 0202)  sodium chloride 0.9 % bolus 1,000 mL (0 mLs Intravenous Stopped 03/28/19 0443)     Initial Impression / Assessment and Plan / ED Course  I have reviewed the triage vital signs and the nursing notes.  Pertinent labs & imaging results that were available during my care of the patient were reviewed by me and considered in my medical decision making (see chart for details).    Initial concern for kidney stone vs diverticulitis vs pyelonephritis vs pyelonephritis.   Ct with constipation. Will treat as same. No e/o above. UA clear, only one dose of macrobid so far, will stop as patient is anxious about side effects, culture sent.   Final Clinical Impressions(s) / ED Diagnoses   Final  diagnoses:  Left flank pain  Constipation, unspecified constipation type    ED Discharge Orders    None       Brycen Bean, Corene Cornea, MD 03/28/19 [phone]

## 2019-03-28 NOTE — Discharge Instructions (Addendum)

## 2019-03-29 ENCOUNTER — Encounter: Payer: Medicare Other | Admitting: Internal Medicine

## 2019-03-29 LAB — NOVEL CORONAVIRUS, NAA (HOSP ORDER, SEND-OUT TO REF LAB; TAT 18-24 HRS): SARS-CoV-2, NAA: NOT DETECTED

## 2019-03-29 LAB — URINE CULTURE

## 2019-03-30 ENCOUNTER — Ambulatory Visit: Payer: Medicare Other | Admitting: Gastroenterology

## 2019-03-30 ENCOUNTER — Encounter: Payer: Self-pay | Admitting: *Deleted

## 2019-03-30 ENCOUNTER — Encounter: Payer: Self-pay | Admitting: Gastroenterology

## 2019-03-30 ENCOUNTER — Other Ambulatory Visit: Payer: Self-pay

## 2019-03-30 VITALS — BP 103/74 | HR 73 | Temp 96.7°F | Ht 65.0 in | Wt 137.0 lb

## 2019-03-30 DIAGNOSIS — K5900 Constipation, unspecified: Secondary | ICD-10-CM

## 2019-03-30 DIAGNOSIS — K219 Gastro-esophageal reflux disease without esophagitis: Secondary | ICD-10-CM

## 2019-03-30 DIAGNOSIS — R1310 Dysphagia, unspecified: Secondary | ICD-10-CM | POA: Diagnosis not present

## 2019-03-30 MED ORDER — FAMOTIDINE 20 MG PO TABS: 20.0000 mg | ORAL_TABLET | Freq: Two times a day (BID) | ORAL | 3 refills | Status: DC

## 2019-03-30 NOTE — Assessment & Plan Note (Signed)
New onset, multifactorial in setting of dietary, behavior, and interestingly noted symptoms starting around March 2020 when she had started amiodarone, which has since been discontinued. Endorsing routine Ibuprofen use as well. Vague solid and liquid dysphagia. Pepcid daily without relief. Prilosec listed as allergy, causing "seizures". However, patient does not recall this.   Increase Pepcid to BID. Pursue BPE as patient is not interested in EGD currently unless absolutely necessary. Will need to research more the Prilosec allergy. Would benefit from a PPI and likely will start Protonix in future. Avoid NSAIDs. Return in 6-8 weeks for close follow-up. GERD diet provided.

## 2019-03-30 NOTE — Patient Instructions (Signed)
I have sent in Pepcid to take twice a day. I have also attached a handout for help with eating foods that will not aggravate reflux.   We have arranged an xray of your esophagus. We will call with results once available!  For constipation: start taking Benefiber 1 tsp daily and increase to 3 teaspoons a day as tolerated. You may take dulcolax daily or Miralax in evening if needed for a bowel movement.  We will see you back in 2-3 months!  Please call with an update next week!  It was a pleasure to see you today. I strive to create trusting relationships with patients to provide genuine, compassionate, and quality care. I value your feedback. If you receive a survey regarding your visit,  I greatly appreciate you taking time to fill this out.   Annitta Needs, PhD, ANP-BC [facility]

## 2019-03-30 NOTE — Assessment & Plan Note (Signed)
Start Benefiber. Add Miralax on any given day if needed. Return in 6-8 weeks.

## 2019-03-30 NOTE — Progress Notes (Addendum)
REVIEWED-NO ADDITIONAL RECOMMENDATIONS.  Primary Care Physician:  Celene Squibb, MD Primary Gastroenterologist:  Dr. Oneida Alar   Chief Complaint  Patient presents with  . Gastroesophageal Reflux  . Constipation    went to ED 03/28/19    HPI:   Carol Hardin is a 85 y.o. female presenting today at the request of Dr. [name] due to GERD.   She was recently seen in ED March 28, 2019, with flank pain. CT with moderate stool burden throughout colon. Sigmoid colon diverticulitis in Oct 2017. Had been empirically treated for UTI for Macrobid prior to this but stopped as UA and culture unrevealing in ED.   Had been taking Miralax but nothing was happening (around 6/14). Took 2 dulcolax tablets without relief and then went to ED. Took an extra tablet 6/17 then had BMs all day on 6/18. Drank Ensure chocolate and went right through her all day long. Had to get in shower three times to wash herself off. Chronic constipation since 2016. Had been on Miralax and probiotics. BMs are hard and has to struggle to get it out. Not taking supplemental fiber.   Notes worsening reflux with ice tea and ice cream. Reflux onset in March 2020. Around time of starting amiodarone. Swallows but has solid and liquid dysphagia. LLQ pain intermittently. Pepcid wasn't helping. Didn't know when to take it. Took it once a day. Tylenol doesn't help with headaches but takes one Advil in the morning if waking with a headache. Prilosec listed as allergy with "seizure". She does not remember this.   She desires a non-invasive approach for management of upper GI concerns at this time unless EGD is absolutely necessary.     Past Medical History:  Diagnosis Date  . Anxiety   . Arthritis    "fingers, right toe" (08/09/2016)  . Basal cell carcinoma of lower leg, right   . Bradycardia    a. Holter 08/30/16 showed profound bradycardia down to 30 during awake hours, several 2 second pauses, very frequent PVCs >8000 in 48 hours, and  NSVT (longest of 14 beats).  . Cardiomyopathy ([place])    a. EF 40% by echo 08/2016.  . Daily headache    "I usually wake up w/a headache; sometimes it's a migraine" (08/09/2016)  . Depression   . Diverticulosis    Hx. of  . Frequent PVCs   . GERD (gastroesophageal reflux disease)   . Hypercholesterolemia   . Migraine   . Mitral regurgitation   . MVP (mitral valve prolapse)   . NSVT (nonsustained ventricular tachycardia) ([place])    a. first noted event monitor 08/2016.  . Osteoporosis   . PAF (paroxysmal atrial fibrillation) ([place])    a. in afib at time of echo 08/2016.  Marland Kitchen Pneumonia   . Scoliosis    mild  . Squamous cell carcinoma of neck     Past Surgical History:  Procedure Laterality Date  . AV NODE ABLATION N/A 01/23/2019   Procedure: AV NODE ABLATION;  Surgeon: Thompson Grayer, MD;  Location: [place] [facility];  Service: Cardiovascular;  Laterality: N/A;  . BASAL CELL CARCINOMA EXCISION Right    RLE  . BIV PACEMAKER INSERTION CRT-P N/A 01/23/2019   Procedure: BIV PACEMAKER INSERTION CRT-P;  Surgeon: Thompson Grayer, MD;  Location: [place] [facility];  Service: Cardiovascular;  Laterality: N/A;  . BLEPHAROPLASTY    . BREAST BIOPSY Left   . BREAST CYST ASPIRATION Left   . BREAST CYST EXCISION Left   .  BUNIONECTOMY Bilateral   . DILATION AND CURETTAGE OF UTERUS    . EXCISIONAL HEMORRHOIDECTOMY    . INTRAMEDULLARY (IM) NAIL INTERTROCHANTERIC Left 12/31/2017   Procedure: INTRAMEDULLARY (IM) NAIL INTERTROCHANTRIC;  Surgeon: Nicholes Stairs, MD;  Location: [place];  Service: Orthopedics;  Laterality: Left;  . KNEE ARTHROSCOPY Right 04/12/2007  . KNEE ARTHROSCOPY Left   . SQUAMOUS CELL CARCINOMA EXCISION     "neck"  . TONSILLECTOMY      Current Outpatient Medications  Medication Sig Dispense Refill  . apixaban (ELIQUIS) 5 MG TABS tablet Take 1 tablet (5 mg total) by mouth 2 (two) times daily. Please hold until Friday afternoon 60 tablet 6  . bisacodyl (DULCOLAX) 5 MG EC  tablet Take 5 mg by mouth as needed for moderate constipation.    . Emollient ([place] [employer] HAND & BODY) LOTN Apply 1 application topically daily as needed.    . Ensure (ENSURE) Take 237 mLs by mouth daily.     . furosemide (LASIX) 20 MG tablet Take 1 tablet (20 mg total) by mouth every Tuesday, Thursday, Saturday, and Sunday. 20 tablet 0  . ibuprofen (ADVIL) 200 MG tablet Take 200 mg by mouth every 6 (six) hours as needed.    Marland Kitchen LORazepam (ATIVAN) 1 MG tablet Take 1 mg by mouth at bedtime.    [name] Glycol-Propyl Glycol (SYSTANE OP) Apply to eye as needed.    . famotidine (PEPCID) 20 MG tablet Take 1 tablet (20 mg total) by mouth 2 (two) times daily. 60 tablet 3   No current facility-administered medications for this visit.     Allergies as of 03/30/2019 - Review Complete 03/30/2019  Allergen Reaction Noted  . Omeprazole Other (See Comments) 09/01/2016  . Flagyl [metronidazole] Nausea And Vomiting 08/19/2016  . Amiodarone  03/30/2019  . Aripiprazole Other (See Comments) 10/09/2013  . Hylan g-f 20 Other (See Comments) 10/09/2013  . Paroxetine hcl Other (See Comments) 04/23/2011  . Statins Other (See Comments) 10/18/2018  . Sulfa antibiotics Diarrhea and Other (See Comments) 05/19/2018  . Toprol xl [metoprolol succinate] Other (See Comments) 04/23/2011  . Vancomycin Itching and Other (See Comments) 04/23/2011  . Penicillins Rash     Family History  Problem Relation Age of Onset  . Heart failure Mother   . Leukemia Mother   . Heart failure Father     Social History   Socioeconomic History  . Marital status: Married    Spouse name: Not on file  . Number of children: Not on file  . Years of education: Not on file  . Highest education level: Not on file  Occupational History  . Not on file  Social Needs  . Financial resource strain: Somewhat hard  . Food insecurity    Worry: Never true    Inability: Never true  . Transportation needs    Medical: No    Non-medical: No   Tobacco Use  . Smoking status: Former Smoker    Packs/day: 1.00    Years: 20.00    Pack years: 20.00    Types: Cigarettes    Quit date: 1989    Years since quitting: 31.4  . Smokeless tobacco: Never Used  Substance and Sexual Activity  . Alcohol use: No  . Drug use: No  . Sexual activity: Not Currently  Lifestyle  . Physical activity    Days per week: 0 days    Minutes per session: 0 min  . Stress: Rather much  Relationships  . Social connections  Talks on phone: Once a week    Gets together: Once a week    Attends religious service: Never    Active member of club or organization: No    Attends meetings of clubs or organizations: Never    Relationship status: Married  . Intimate partner violence    Fear of current or ex partner: No    Emotionally abused: No    Physically abused: No    Forced sexual activity: No  Other Topics Concern  . Not on file  Social History Narrative  . Not on file    Review of Systems: Gen: Denies any fever, chills, fatigue, weight loss, lack of appetite.  CV: Denies chest pain, heart palpitations, peripheral edema, syncope.  Resp: Denies shortness of breath at rest or with exertion. Denies wheezing or cough.  GI: see HPI GU : Denies urinary burning, urinary frequency, urinary hesitancy MS: Denies joint pain, muscle weakness, cramps, or limitation of movement.  Derm: Denies rash, itching, dry skin Psych: Denies depression, anxiety, memory loss, and confusion Heme: Denies bruising, bleeding, and enlarged lymph nodes.  Physical Exam: BP 103/74   Pulse 73   Temp (!) 96.7 F (35.9 C) (Oral)   Ht 5\' 5"  (1.651 m)   Wt 137 lb (62.1 kg)   BMI 22.80 kg/m  General:   Alert and oriented. Pleasant and cooperative. Well-nourished and well-developed.  Head:  Normocephalic and atraumatic. Eyes:  Without icterus, sclera clear and conjunctiva pink.  Ears:  Normal auditory acuity. Nose:  No deformity, discharge,  or lesions. Mouth:  No deformity  or lesions, oral mucosa pink.  Lungs:  Clear to auscultation bilaterally. Heart:  S1, S2 present  Abdomen:  +BS, soft, non-tender and non-distended. No HSM noted. No guarding or rebound. No masses appreciated.  Rectal:  Deferred  Msk:  Slight kyphosis  Extremities:  With 1+ ankle and lower leg edema. Neurologic:  Alert and  oriented x4 Psych:  Alert and cooperative. Normal mood and affect.

## 2019-03-30 NOTE — Assessment & Plan Note (Signed)
BPE ordered.

## 2019-04-02 ENCOUNTER — Telehealth: Payer: Self-pay | Admitting: Internal Medicine

## 2019-04-02 NOTE — Telephone Encounter (Signed)
Would like to schedule pacer check

## 2019-04-02 NOTE — Progress Notes (Signed)
cc'ed to pcp °

## 2019-04-03 ENCOUNTER — Ambulatory Visit (HOSPITAL_COMMUNITY): Payer: Medicare Other

## 2019-04-04 ENCOUNTER — Ambulatory Visit (HOSPITAL_COMMUNITY)
Admission: RE | Admit: 2019-04-04 | Discharge: 2019-04-04 | Disposition: A | Discharge status: Home / Self Care | Payer: Medicare Other | Source: Ambulatory Visit | Attending: Gastroenterology | Admitting: Gastroenterology

## 2019-04-04 ENCOUNTER — Other Ambulatory Visit: Payer: Self-pay

## 2019-04-04 DIAGNOSIS — R1310 Dysphagia, unspecified: Secondary | ICD-10-CM

## 2019-04-04 DIAGNOSIS — K224 Dyskinesia of esophagus: Secondary | ICD-10-CM | POA: Diagnosis not present

## 2019-04-06 ENCOUNTER — Encounter: Payer: Self-pay | Admitting: Internal Medicine

## 2019-04-06 ENCOUNTER — Other Ambulatory Visit: Payer: Self-pay

## 2019-04-06 ENCOUNTER — Ambulatory Visit (INDEPENDENT_AMBULATORY_CARE_PROVIDER_SITE_OTHER): Payer: Medicare Other | Admitting: Internal Medicine

## 2019-04-06 VITALS — BP 104/60 | HR 71 | Ht 65.0 in | Wt 135.0 lb

## 2019-04-06 DIAGNOSIS — I482 Chronic atrial fibrillation, unspecified: Secondary | ICD-10-CM | POA: Diagnosis not present

## 2019-04-06 DIAGNOSIS — I4819 Other persistent atrial fibrillation: Secondary | ICD-10-CM

## 2019-04-06 DIAGNOSIS — I5022 Chronic systolic (congestive) heart failure: Secondary | ICD-10-CM | POA: Diagnosis not present

## 2019-04-06 LAB — CUP PACEART INCLINIC DEVICE CHECK
Battery Remaining Longevity: 87 mo
Battery Voltage: 3.01 V
Brady Statistic RA Percent Paced: 0.26 %
Brady Statistic RV Percent Paced: 96 %
Date Time Interrogation Session: 20200626155806
Implantable Lead Implant Date: 20200414
Implantable Lead Implant Date: 20200414
Implantable Lead Implant Date: 20200414
Implantable Lead Location: 753858
Implantable Lead Location: 753859
Implantable Lead Location: 753860
Implantable Pulse Generator Implant Date: 20200414
Lead Channel Impedance Value: 462.5 Ohm
Lead Channel Impedance Value: 575 Ohm
Lead Channel Impedance Value: 687.5 Ohm
Lead Channel Pacing Threshold Amplitude: 0.5 V
Lead Channel Pacing Threshold Amplitude: 1.5 V
Lead Channel Pacing Threshold Pulse Width: 0.5 ms
Lead Channel Pacing Threshold Pulse Width: 0.5 ms
Lead Channel Sensing Intrinsic Amplitude: 1.7 mV
Lead Channel Sensing Intrinsic Amplitude: 6.5 mV
Lead Channel Setting Pacing Amplitude: 2.5 V
Lead Channel Setting Pacing Amplitude: 2.5 V
Lead Channel Setting Pacing Amplitude: 3.5 V
Lead Channel Setting Pacing Pulse Width: 0.5 ms
Lead Channel Setting Pacing Pulse Width: 0.5 ms
Lead Channel Setting Sensing Sensitivity: 3 mV
Pulse Gen Serial Number: 9431558

## 2019-04-06 NOTE — Progress Notes (Signed)
PCP: Celene Squibb, MD Primary Cardiologist: [place] Primary EP:  Dr Rayann Heman  [name] is a 85 y.o. female who presents today for routine electrophysiology followup.  Since last being seen in our clinic, the patient reports doing very well.  Today, she denies symptoms of palpitations, chest pain, shortness of breath,  lower extremity edema, dizziness, presyncope, or syncope.  The patient is otherwise without complaint today.   Past Medical History:  Diagnosis Date  . Anxiety   . Arthritis    "fingers, right toe" (08/09/2016)  . Basal cell carcinoma of lower leg, right   . Bradycardia    a. Holter 08/30/16 showed profound bradycardia down to 30 during awake hours, several 2 second pauses, very frequent PVCs >8000 in 48 hours, and NSVT (longest of 14 beats).  . Cardiomyopathy ([place])    a. EF 40% by echo 08/2016.  . Daily headache    "I usually wake up w/a headache; sometimes it's a migraine" (08/09/2016)  . Depression   . Diverticulosis    Hx. of  . Frequent PVCs   . GERD (gastroesophageal reflux disease)   . Hypercholesterolemia   . Migraine   . Mitral regurgitation   . MVP (mitral valve prolapse)   . NSVT (nonsustained ventricular tachycardia) ([place])    a. first noted event monitor 08/2016.  . Osteoporosis   . PAF (paroxysmal atrial fibrillation) ([place])    a. in afib at time of echo 08/2016.  Marland Kitchen Pneumonia   . Scoliosis    mild  . Squamous cell carcinoma of neck    Past Surgical History:  Procedure Laterality Date  . AV NODE ABLATION N/A 01/23/2019   Procedure: AV NODE ABLATION;  Surgeon: Thompson Grayer, MD;  Location: [place] [facility];  Service: Cardiovascular;  Laterality: N/A;  . BASAL CELL CARCINOMA EXCISION Right    RLE  . BIV PACEMAKER INSERTION CRT-P N/A 01/23/2019   Procedure: BIV PACEMAKER INSERTION CRT-P;  Surgeon: Thompson Grayer, MD;  Location: [place] [facility];  Service: Cardiovascular;  Laterality: N/A;  . BLEPHAROPLASTY    . BREAST BIOPSY Left   .  BREAST CYST ASPIRATION Left   . BREAST CYST EXCISION Left   . BUNIONECTOMY Bilateral   . DILATION AND CURETTAGE OF UTERUS    . EXCISIONAL HEMORRHOIDECTOMY    . INTRAMEDULLARY (IM) NAIL INTERTROCHANTERIC Left 12/31/2017   Procedure: INTRAMEDULLARY (IM) NAIL INTERTROCHANTRIC;  Surgeon: Nicholes Stairs, MD;  Location: [place];  Service: Orthopedics;  Laterality: Left;  . KNEE ARTHROSCOPY Right 04/12/2007  . KNEE ARTHROSCOPY Left   . SQUAMOUS CELL CARCINOMA EXCISION     "neck"  . TONSILLECTOMY      ROS- all systems are reviewed and negative except as per HPI above  Current Outpatient Medications  Medication Sig Dispense Refill  . apixaban (ELIQUIS) 5 MG TABS tablet Take 1 tablet (5 mg total) by mouth 2 (two) times daily. Please hold until Friday afternoon 60 tablet 6  . bisacodyl (DULCOLAX) 5 MG EC tablet Take 5 mg by mouth as needed for moderate constipation.    . Emollient ([employer] [employer] HAND & BODY) LOTN Apply 1 application topically daily as needed.    . Ensure (ENSURE) Take 237 mLs by mouth daily.     . famotidine (PEPCID) 20 MG tablet Take 20 mg by mouth daily.    . furosemide (LASIX) 20 MG tablet Take 1 tablet (20 mg total) by mouth every Tuesday, Thursday, Saturday, and Sunday. 20 tablet  0  . ibuprofen (ADVIL) 200 MG tablet Take 200 mg by mouth every 6 (six) hours as needed.    Marland Kitchen LORazepam (ATIVAN) 1 MG tablet Take 1 mg by mouth at bedtime.    [name] Glycol-Propyl Glycol (SYSTANE OP) Apply to eye as needed.    . Polyethylene Glycol 3350 (MIRALAX PO) Take 1 Dose by mouth as needed (constipation).     . Wheat Dextrin (BENEFIBER DRINK MIX PO) Take 1 Dose by mouth as needed (constipation).      No current facility-administered medications for this visit.     Physical Exam: Vitals:   04/06/19 1509  BP: 104/60  Pulse: 71  SpO2: 97%  Weight: 135 lb (61.2 kg)  Height: 5\' 5"  (1.651 m)    GEN- The patient is well appearing, alert and oriented x 3 today.   Head-  normocephalic, atraumatic Eyes-  Sclera clear, conjunctiva pink Ears- hearing intact Oropharynx- clear Lungs- Clear to ausculation bilaterally, normal work of breathing Chest- pacemaker pocket is well healed Heart- Regular rate and rhythm (paced) GI- soft, NT, ND, + BS Extremities- no clubbing, cyanosis, or edema  Pacemaker interrogation- reviewed in detail today,  See PACEART report    Assessment and Plan:  1. Symptomatic complete heart block Normal pacemaker function See Pace Art report No changes today she is not device dependant today  2. Persistent afib Doing well s/p AV nodal ablation Continue eliquis for chads2vasc score of 4  3. Nonischemic CM Repeat echo in 3 months to assess response to CRT/ AV nodal ablation  Return as scheduled in July  Thompson Grayer MD, [facility] 04/06/2019 3:26 PM

## 2019-04-06 NOTE — Patient Instructions (Signed)
Medication Instructions:  Your physician recommends that you continue on your current medications as directed. Please refer to the Current Medication list given to you today.  Labwork: None ordered.  Testing/Procedures: None ordered.  Follow-Up: As scheduled  Any Other Special Instructions Will Be Listed Below (If Applicable).  If you need a refill on your cardiac medications before your next appointment, please call your pharmacy.

## 2019-04-06 NOTE — Patient Outreach (Signed)
[facility]) Care Management  04/06/2019  Carol Hardin Dec 09, 1932 [id number]    Successful outreach with [name]. She was evaluated in the Emergency Department on 03/28/19 for flank pain and weight gain. Reports symptoms were due to constipation. Symptoms resolved after using laxatives as recommended. Today she reports feeling well. No complaints of shortness of breath. No complaints of chest discomfort or palpitations.   She remains compliant with medications and treatment recommendations. Reports the following readings: Weight-134.5 lbs, B/P-104/75, Pulse-70bpm, O2 saturations- 91% room air.  Discussed changes in care management needs. She is attempting to follow a GERD diet and is interested in being referred to a Nutritionist. Agreeable to follow-up after the referral is approved.  PLAN -Will contact PCP office. -Will continue routine outreach with [name].  [place] [phone]

## 2019-04-11 NOTE — Progress Notes (Signed)
Patient has age-appropriate esophageal dysmotility. This could be contributing to her symptoms. How is she doing with Pepcid BID? No evidence for stricture or esophageal wall abnormalities.

## 2019-04-11 NOTE — Progress Notes (Signed)
LMOM to call.

## 2019-04-12 NOTE — Progress Notes (Signed)
Pt is aware. She has been doing very well, not having to use Pepcid except just occasionally. She did want Carol Kaufman, NP to know that her [facility] case manager has her set up for a rectal exam with Dr. [name] on 04/16/2019. She said she just has some problems with constipation sometimes. Will let us know if she needs anything.

## 2019-04-13 DIAGNOSIS — I5020 Unspecified systolic (congestive) heart failure: Secondary | ICD-10-CM | POA: Diagnosis not present

## 2019-04-13 DIAGNOSIS — R0602 Shortness of breath: Secondary | ICD-10-CM | POA: Diagnosis not present

## 2019-04-13 DIAGNOSIS — R0902 Hypoxemia: Secondary | ICD-10-CM | POA: Diagnosis not present

## 2019-04-13 DIAGNOSIS — R0600 Dyspnea, unspecified: Secondary | ICD-10-CM | POA: Diagnosis not present

## 2019-04-16 DIAGNOSIS — R1030 Lower abdominal pain, unspecified: Secondary | ICD-10-CM | POA: Diagnosis not present

## 2019-04-16 DIAGNOSIS — F5101 Primary insomnia: Secondary | ICD-10-CM | POA: Diagnosis not present

## 2019-04-16 DIAGNOSIS — K5900 Constipation, unspecified: Secondary | ICD-10-CM | POA: Diagnosis not present

## 2019-04-16 DIAGNOSIS — R600 Localized edema: Secondary | ICD-10-CM | POA: Diagnosis not present

## 2019-04-23 ENCOUNTER — Ambulatory Visit: Payer: Self-pay

## 2019-04-24 ENCOUNTER — Other Ambulatory Visit: Payer: Self-pay

## 2019-04-24 ENCOUNTER — Telehealth: Payer: Self-pay

## 2019-04-24 NOTE — Patient Outreach (Signed)
[facility]) Care Management  04/24/2019  CORYNN SOLBERG 09-Aug-1933 [id number]   Successful outreach with Mrs. Carol Hardin. Reports feeling well since our last conversation. She was evaluated by Dr. [name] on 04/16/19. Nutrition consult was submitted. Pending outreach with assigned Nutritionist.  She remains compliant with medications. Reports using meal supplements and following diet recommendations. She reports tolerating small meals without complications.   She was unable to locate her daily log at the time of the call. Reports a weight range of 135-136lbs. Reports oxygen saturations and blood pressure readings were within range. She has not required use of home O2.   Denies urgent concerns or changes in care management needs. Will continue routine outreach.  PLAN -Will follow-up next month.  [place] [phone]

## 2019-04-30 DIAGNOSIS — M25774 Osteophyte, right foot: Secondary | ICD-10-CM | POA: Diagnosis not present

## 2019-04-30 DIAGNOSIS — M79671 Pain in right foot: Secondary | ICD-10-CM | POA: Diagnosis not present

## 2019-04-30 DIAGNOSIS — M7661 Achilles tendinitis, right leg: Secondary | ICD-10-CM | POA: Diagnosis not present

## 2019-05-02 ENCOUNTER — Telehealth: Payer: Self-pay

## 2019-05-02 NOTE — Telephone Encounter (Signed)
Spoke with pt regarding appt on 05/07/19. Pt stated she does not have a smart device. Pt was advise keep phonevisit. Pt questions and concerns were address.

## 2019-05-04 NOTE — Telephone Encounter (Signed)
This encounter was created in error - please disregard.

## 2019-05-07 ENCOUNTER — Encounter: Payer: Self-pay | Admitting: Internal Medicine

## 2019-05-07 ENCOUNTER — Telehealth (INDEPENDENT_AMBULATORY_CARE_PROVIDER_SITE_OTHER): Payer: Medicare Other | Admitting: Internal Medicine

## 2019-05-07 VITALS — BP 117/65 | HR 70 | Ht 65.0 in | Wt 133.5 lb

## 2019-05-07 DIAGNOSIS — I447 Left bundle-branch block, unspecified: Secondary | ICD-10-CM | POA: Diagnosis not present

## 2019-05-07 DIAGNOSIS — Z7901 Long term (current) use of anticoagulants: Secondary | ICD-10-CM

## 2019-05-07 DIAGNOSIS — I442 Atrioventricular block, complete: Secondary | ICD-10-CM

## 2019-05-07 DIAGNOSIS — I4819 Other persistent atrial fibrillation: Secondary | ICD-10-CM

## 2019-05-07 DIAGNOSIS — I428 Other cardiomyopathies: Secondary | ICD-10-CM

## 2019-05-07 NOTE — Progress Notes (Signed)
Electrophysiology TeleHealth Note  Due to national recommendations of social distancing due to [place] 19, an audio telehealth visit is felt to be most appropriate for this patient at this time.  Verbal consent was obtained by me for the telehealth visit today.  The patient does not have capability for a virtual visit.  A phone visit is therefore required today.   Date:  05/07/2019   ID:  Carol Hardin, DOB Dec 14, 1932, MRN [id number]  Location: patient's home  Provider location:  [facility]  Evaluation Performed: Follow-up visit  PCP:  Celene Squibb, MD   Electrophysiologist:  Dr Rayann Heman  Chief Complaint:  afib  History of Present Illness:    Carol Hardin is a 85 y.o. female who presents via telehealth conferencing today.  Since last being seen in our clinic, the patient reports doing reasonably well.  Today, she denies symptoms of palpitations, chest pain, shortness of breath,  lower extremity edema, dizziness, presyncope, or syncope.  The patient is otherwise without complaint today.  The patient denies symptoms of fevers, chills, cough, or new SOB worrisome for COVID 19.  Past Medical History:  Diagnosis Date  . Anxiety   . Arthritis    "fingers, right toe" (08/09/2016)  . Basal cell carcinoma of lower leg, right   . Bradycardia    a. Holter 08/30/16 showed profound bradycardia down to 30 during awake hours, several 2 second pauses, very frequent PVCs >8000 in 48 hours, and NSVT (longest of 14 beats).  . Cardiomyopathy ([place])    a. EF 40% by echo 08/2016.  . Daily headache    "I usually wake up w/a headache; sometimes it's a migraine" (08/09/2016)  . Depression   . Diverticulosis    Hx. of  . Frequent PVCs   . GERD (gastroesophageal reflux disease)   . Hypercholesterolemia   . Migraine   . Mitral regurgitation   . MVP (mitral valve prolapse)   . NSVT (nonsustained ventricular tachycardia) ([place])    a. first noted event monitor 08/2016.  . Osteoporosis   .  PAF (paroxysmal atrial fibrillation) ([place])    a. in afib at time of echo 08/2016.  Marland Kitchen Pneumonia   . Scoliosis    mild  . Squamous cell carcinoma of neck     Past Surgical History:  Procedure Laterality Date  . AV NODE ABLATION N/A 01/23/2019   Procedure: AV NODE ABLATION;  Surgeon: Thompson Grayer, MD;  Location: [place] [facility];  Service: Cardiovascular;  Laterality: N/A;  . BASAL CELL CARCINOMA EXCISION Right    RLE  . BIV PACEMAKER INSERTION CRT-P N/A 01/23/2019   Procedure: BIV PACEMAKER INSERTION CRT-P;  Surgeon: Thompson Grayer, MD;  Location: [place] [facility];  Service: Cardiovascular;  Laterality: N/A;  . BLEPHAROPLASTY    . BREAST BIOPSY Left   . BREAST CYST ASPIRATION Left   . BREAST CYST EXCISION Left   . BUNIONECTOMY Bilateral   . DILATION AND CURETTAGE OF UTERUS    . EXCISIONAL HEMORRHOIDECTOMY    . INTRAMEDULLARY (IM) NAIL INTERTROCHANTERIC Left 12/31/2017   Procedure: INTRAMEDULLARY (IM) NAIL INTERTROCHANTRIC;  Surgeon: Nicholes Stairs, MD;  Location: [place];  Service: Orthopedics;  Laterality: Left;  . KNEE ARTHROSCOPY Right 04/12/2007  . KNEE ARTHROSCOPY Left   . SQUAMOUS CELL CARCINOMA EXCISION     "neck"  . TONSILLECTOMY      Current Outpatient Medications  Medication Sig Dispense Refill  . apixaban (ELIQUIS) 5 MG TABS tablet Take  1 tablet (5 mg total) by mouth 2 (two) times daily. Please hold until Friday afternoon 60 tablet 6  . bisacodyl (DULCOLAX) 5 MG EC tablet Take 5 mg by mouth as needed for moderate constipation.    . Emollient ([place] [employer] HAND & BODY) LOTN Apply 1 application topically daily as needed.    . Ensure (ENSURE) Take 237 mLs by mouth daily.     . furosemide (LASIX) 20 MG tablet Take 20 mg by mouth as needed for fluid.    Marland Kitchen ibuprofen (ADVIL) 200 MG tablet Take 200 mg by mouth every 6 (six) hours as needed.    Marland Kitchen LORazepam (ATIVAN) 1 MG tablet Take 1 mg by mouth at bedtime.    [name] Glycol-Propyl Glycol (SYSTANE OP) Apply to eye as  needed.    . Polyethylene Glycol 3350 (MIRALAX PO) Take 1 Dose by mouth as needed (constipation).     . Wheat Dextrin (BENEFIBER DRINK MIX PO) Take 1 Dose by mouth as needed (constipation).      No current facility-administered medications for this visit.     Allergies:   Omeprazole, Flagyl [metronidazole], Amiodarone, Aripiprazole, Hylan g-f 20, Paroxetine hcl, Statins, Sulfa antibiotics, Toprol xl [metoprolol succinate], Vancomycin, and Penicillins   Social History:  The patient  reports that she quit smoking about 31 years ago. Her smoking use included cigarettes. She has a 20.00 pack-year smoking history. She has never used smokeless tobacco. She reports that she does not drink alcohol or use drugs.   Family History:  The patient's family history includes Heart failure in her father and mother; Leukemia in her mother.   ROS:  Please see the history of present illness.   All other systems are personally reviewed and negative.    Exam:    Vital Signs:  BP 117/65   Pulse 70   Ht 5\' 5"  (1.651 m)   Wt 133 lb 8 oz (60.6 kg)   SpO2 95%   BMI 22.22 kg/m   Well sounding today   Labs/Other Tests and Data Reviewed:    Recent Labs: 11/18/2018: TSH 3.484 01/20/2019: B Natriuretic Peptide 1,638.0 01/23/2019: Magnesium 2.3 03/28/2019: ALT 26; BUN 24; Creatinine, Ser 0.97; Hemoglobin 13.7; Platelets 211; Potassium 4.7; Sodium 137   Wt Readings from Last 3 Encounters:  05/07/19 133 lb 8 oz (60.6 kg)  04/06/19 135 lb (61.2 kg)  03/30/19 137 lb (62.1 kg)       ASSESSMENT & PLAN:    1.  Persistent afib Doing well s/p AV nodal ablation Continue on eliquis for chads2vasc score of 4  2. Complete heart block s/p AV nodal ablation Doing well at this time  3. nonishcemic CM/ LBBB Doing well, without symptoms of CHF Medical therapy per Dr Harl Bowie   Follow-up:  [place] Return to see EP APP in 6 months   Patient Risk:  after full review of this patients clinical status, I feel that they  are at moderate risk at this time.  Today, I have spent 15 minutes with the patient with telehealth technology discussing arrhythmia management .    SignedThompson Grayer, MD  05/07/2019 2:31 PM     [place] [address] [phone] (office) [phone] (fax)

## 2019-05-14 ENCOUNTER — Other Ambulatory Visit: Payer: Self-pay

## 2019-05-14 DIAGNOSIS — R0602 Shortness of breath: Secondary | ICD-10-CM | POA: Diagnosis not present

## 2019-05-14 DIAGNOSIS — R0902 Hypoxemia: Secondary | ICD-10-CM | POA: Diagnosis not present

## 2019-05-14 DIAGNOSIS — R0600 Dyspnea, unspecified: Secondary | ICD-10-CM | POA: Diagnosis not present

## 2019-05-14 DIAGNOSIS — I5020 Unspecified systolic (congestive) heart failure: Secondary | ICD-10-CM | POA: Diagnosis not present

## 2019-05-15 ENCOUNTER — Other Ambulatory Visit: Payer: Self-pay

## 2019-05-22 DIAGNOSIS — R600 Localized edema: Secondary | ICD-10-CM | POA: Diagnosis not present

## 2019-05-22 DIAGNOSIS — F5101 Primary insomnia: Secondary | ICD-10-CM | POA: Diagnosis not present

## 2019-05-22 DIAGNOSIS — I4819 Other persistent atrial fibrillation: Secondary | ICD-10-CM | POA: Diagnosis not present

## 2019-05-22 DIAGNOSIS — K5900 Constipation, unspecified: Secondary | ICD-10-CM | POA: Diagnosis not present

## 2019-05-22 DIAGNOSIS — I455 Other specified heart block: Secondary | ICD-10-CM | POA: Diagnosis not present

## 2019-06-13 ENCOUNTER — Other Ambulatory Visit: Payer: Self-pay

## 2019-06-13 ENCOUNTER — Encounter: Payer: Medicare Other | Attending: Internal Medicine | Admitting: Nutrition

## 2019-06-13 DIAGNOSIS — E440 Moderate protein-calorie malnutrition: Secondary | ICD-10-CM

## 2019-06-13 DIAGNOSIS — K2100 Gastro-esophageal reflux disease with esophagitis, without bleeding: Secondary | ICD-10-CM

## 2019-06-13 DIAGNOSIS — E7800 Pure hypercholesterolemia, unspecified: Secondary | ICD-10-CM | POA: Insufficient documentation

## 2019-06-13 DIAGNOSIS — R634 Abnormal weight loss: Secondary | ICD-10-CM | POA: Insufficient documentation

## 2019-06-13 NOTE — Progress Notes (Signed)
  Medical Nutrition Therapy:  Appt start time: 1630 end time:  1730.   Assessment:  Primary concerns today: Hyperlipidemia and GERD.h/0 CRI and High Potassium. Lives with her husband. Recently had a pacemaker. Eats small frequent meals. Her main meal is breakfast.  Has lost about 25 lbs. Drinking Ensure    CMP Latest Ref Rng & Units 03/28/2019 03/28/2019 01/24/2019  Glucose 70 - 99 mg/dL - 95 111(H)  BUN 8 - 23 mg/dL - 24(H) 18  Creatinine 0.44 - 1.00 mg/dL - 0.97 1.21(H)  Sodium 135 - 145 mmol/L - 137 137  Potassium 3.5 - 5.1 mmol/L 4.7 5.8(H) 3.9  Chloride 98 - 111 mmol/L - 105 103  CO2 22 - 32 mmol/L - 23 22  Calcium 8.9 - 10.3 mg/dL - 9.3 9.2  Total Protein 6.5 - 8.1 g/dL - 7.5 -  Total Bilirubin 0.3 - 1.2 mg/dL - 1.7(H) -  Alkaline Phos 38 - 126 U/L - 64 -  AST 15 - 41 U/L - 44(H) -  ALT 0 - 44 U/L - 26 -    Preferred Learning Style:    No preference indicated   Learning Readiness:     Change in progress   MEDICATIONS:    DIETARY INTAKE:   24-hr recall:  B ( AM): egg beaters, or cheese omelet, 2 slices bacon, 1 slice toast ww, Orange juice, Snk ( AM):  L ( PM): PB and honey sandwich, Ensure Enlive, water Snk ( PM): D ( PM): LS, Canned green beans, and beets, potatoes,  Snk ( PM): Beverages: decaf, water,  Usual physical activity:  Estimated energy needs: 1500-1800 calories 170 g carbohydrates 112 g protein 42 g fat  Progress Towards Goal(s):  In progress.   Nutritional Diagnosis:  NB-1.1 Food and nutrition-related knowledge deficit As related to Weight loss, CRI and GERD.  As evidenced by 25 lbs weight loss, Creatine elevated..    Intervention:  Nutrition  Low Salt, Low Cholesterol, Heart Healthy Meals, GERD   Goals Eat three balanced meals Nutrient dense snacks between meals Choose lower sodium foods Increase higher fiber and more fresh fruits and vegetables Avoid fried and greasy foods, acidic foods Gain 2-3 lbs per month  Teaching Method  Utilized:  Visual Auditory Hands on  Handouts given during visit include:  The Plate Method  Low Salt Low Fat Diet  Barriers to learning/adherence to lifestyle change: none  Demonstrated degree of understanding via:  Teach Back   Monitoring/Evaluation:  Dietary intake, exercise, and body weight in 2 month(s).

## 2019-06-14 ENCOUNTER — Encounter: Payer: Self-pay | Admitting: Cardiology

## 2019-06-14 ENCOUNTER — Other Ambulatory Visit: Payer: Self-pay

## 2019-06-14 ENCOUNTER — Ambulatory Visit: Payer: Medicare Other | Admitting: Cardiology

## 2019-06-14 VITALS — BP 102/73 | HR 70 | Ht 65.0 in | Wt 134.0 lb

## 2019-06-14 DIAGNOSIS — R0902 Hypoxemia: Secondary | ICD-10-CM | POA: Diagnosis not present

## 2019-06-14 DIAGNOSIS — I5022 Chronic systolic (congestive) heart failure: Secondary | ICD-10-CM

## 2019-06-14 DIAGNOSIS — R0600 Dyspnea, unspecified: Secondary | ICD-10-CM | POA: Diagnosis not present

## 2019-06-14 DIAGNOSIS — R0602 Shortness of breath: Secondary | ICD-10-CM | POA: Diagnosis not present

## 2019-06-14 DIAGNOSIS — I5020 Unspecified systolic (congestive) heart failure: Secondary | ICD-10-CM | POA: Diagnosis not present

## 2019-06-14 DIAGNOSIS — I495 Sick sinus syndrome: Secondary | ICD-10-CM

## 2019-06-14 NOTE — Patient Outreach (Signed)
[facility]) Care Management  06/14/2019  Carol Hardin Sep 09, 1933 [id number]  Successful outreach with Carol Hardin. She reports feeling well over the past few weeks. She reports shortness of breath with exertion but reports feeling well at rest. Denies complaints of chest pain or palpitations. She continues to perform ADLs independently. Denies falls.  She remains compliant with medications and treatment recommendations. She is doing very well with diet recommendations. Reports the following readings today: BP-102/73     Pulse-70    Weight-134lbs      O2 saturations-95-98% Room Air.  She continues to attend provider appointments as scheduled. Reports completing telephonic outreach with the dietician on yesterday. She completed a Cardiology follow-up with Dr. Harl Bowie earlier today.  Her primary concern is her current activity level. She feels that she still fatigues rather easily and is very eager to return to her previous level of activity. She was strongly encouraged to follow MD recommendations, avoid overexertion and gradually increase her activity. She plans to start with short walks within the next week. She is aware of safety measures and agreeable to using precaution to prevent falls. Current Outpatient Medications on File Prior to Visit  Medication Sig Dispense Refill  . acetaminophen (TYLENOL) 325 MG tablet Take 650 mg by mouth every 6 (six) hours as needed.    Marland Kitchen apixaban (ELIQUIS) 5 MG TABS tablet Take 1 tablet (5 mg total) by mouth 2 (two) times daily. Please hold until Friday afternoon 60 tablet 6  . Emollient ([employer] HAND & BODY) LOTN Apply 1 application topically daily as needed.    . Ensure (ENSURE) Take 237 mLs by mouth daily.     . furosemide (LASIX) 20 MG tablet Take 20 mg by mouth as needed for fluid.    Marland Kitchen LORazepam (ATIVAN) 1 MG tablet Take 1 mg by mouth at bedtime.    [name] Glycol-Propyl Glycol (SYSTANE OP) Apply to eye as needed.    .  Polyethylene Glycol 3350 (MIRALAX PO) Take 1 Dose by mouth as needed (constipation).     . Wheat Dextrin (BENEFIBER DRINK MIX PO) Take 1 Dose by mouth as needed (constipation).      No current facility-administered medications on file prior to visit.     PLAN -Will continue routine outreach.   [facility] [phone]

## 2019-06-14 NOTE — Progress Notes (Signed)
Clinical Summary Ms. Yarberry is a 85 y.o.female  seen today for follow up of the following medical problems.  1. Afib with Tachy-brady syndrome - s/p av nodal ablation and pacemaker placement   - denies any palpitations.    2.Chronicsystolic HF - LVEF 59-93%, moderate RV dysfunction, moderate pleural effusion, moderaet MR - LVEF 2018 55-60%. Perhaps tachy medicated CM - volume overloaded during recent admission, diuresed. Did not requrie discharge diuretic, remains euvolemic. Suspect decompensation due to pneumonia and afib with RVR. - bp's too soft to consider additional CHF meds   - no recent SOB or LE edema. Fluid status has done much better now that her rhythms have been stable.     3. Generalized anxiety disorder - on ativan at home - quite anxious in general about medications, medical procedures, and her overall health.       Past Medical History:  Diagnosis Date   Anxiety    Arthritis    "fingers, right toe" (08/09/2016)   Basal cell carcinoma of lower leg, right    Bradycardia    a. Holter 08/30/16 showed profound bradycardia down to 30 during awake hours, several 2 second pauses, very frequent PVCs >8000 in 48 hours, and NSVT (longest of 14 beats).   Cardiomyopathy ([place])    a. EF 40% by echo 08/2016.   Daily headache    "I usually wake up w/a headache; sometimes it's a migraine" (08/09/2016)   Depression    Diverticulosis    Hx. of   Frequent PVCs    GERD (gastroesophageal reflux disease)    Hypercholesterolemia    Migraine    Mitral regurgitation    MVP (mitral valve prolapse)    NSVT (nonsustained ventricular tachycardia) ([place])    a. first noted event monitor 08/2016.   Osteoporosis    PAF (paroxysmal atrial fibrillation) ([place])    a. in afib at time of echo 08/2016.   Pneumonia    Scoliosis    mild   Squamous cell carcinoma of neck      Allergies  Allergen Reactions   Omeprazole Other (See Comments)      siezure   Flagyl [Metronidazole] Nausea And Vomiting   Amiodarone    Aripiprazole Other (See Comments)    Unknown reaction   Hylan G-F 20 Other (See Comments)    Unknown reaction   Paroxetine Hcl Other (See Comments)    Headaches (a long time ago- pt doesn't really remember)   Statins Other (See Comments)    No specific reaction given-patient states that she was advised not to take by physician   Sulfa Antibiotics Diarrhea and Other (See Comments)    Colitis    Toprol Xl [Metoprolol Succinate] Other (See Comments)    Dizziness--pt doesn't really remember    Vancomycin Itching and Other (See Comments)    Pt reports that they gave it too fast- she started having itching in the scalp   Penicillins Rash    DID THE REACTION INVOLVE: Swelling of the face/tongue/throat, SOB, or low BP? No Sudden or severe rash/hives, skin peeling, or the inside of the mouth or nose? Yes Did it require medical treatment? Unknown When did it last happen?Over 10 years If all above answers are NO, may proceed with cephalosporin use.      Current Outpatient Medications  Medication Sig Dispense Refill   apixaban (ELIQUIS) 5 MG TABS tablet Take 1 tablet (5 mg total) by mouth 2 (two) times daily. Please hold until Friday afternoon  60 tablet 6   bisacodyl (DULCOLAX) 5 MG EC tablet Take 5 mg by mouth as needed for moderate constipation.     Emollient ([employer] HAND & BODY) LOTN Apply 1 application topically daily as needed.     Ensure (ENSURE) Take 237 mLs by mouth daily.      furosemide (LASIX) 20 MG tablet Take 20 mg by mouth as needed for fluid.     ibuprofen (ADVIL) 200 MG tablet Take 200 mg by mouth every 6 (six) hours as needed.     LORazepam (ATIVAN) 1 MG tablet Take 1 mg by mouth at bedtime.     Polyethyl Glycol-Propyl Glycol (SYSTANE OP) Apply to eye as needed.     Polyethylene Glycol 3350 (MIRALAX PO) Take 1 Dose by mouth as needed (constipation).      Wheat Dextrin  (BENEFIBER DRINK MIX PO) Take 1 Dose by mouth as needed (constipation).      No current facility-administered medications for this visit.      Past Surgical History:  Procedure Laterality Date   AV NODE ABLATION N/A 01/23/2019   Procedure: AV NODE ABLATION;  Surgeon: Thompson Grayer, MD;  Location: [facility];  Service: Cardiovascular;  Laterality: N/A;   BASAL CELL CARCINOMA EXCISION Right    RLE   BIV PACEMAKER INSERTION CRT-P N/A 01/23/2019   Procedure: BIV PACEMAKER INSERTION CRT-P;  Surgeon: Thompson Grayer, MD;  Location: [facility];  Service: Cardiovascular;  Laterality: N/A;   BLEPHAROPLASTY     BREAST BIOPSY Left    BREAST CYST ASPIRATION Left    BREAST CYST EXCISION Left    BUNIONECTOMY Bilateral    DILATION AND CURETTAGE OF UTERUS     EXCISIONAL HEMORRHOIDECTOMY     INTRAMEDULLARY (IM) NAIL INTERTROCHANTERIC Left 12/31/2017   Procedure: INTRAMEDULLARY (IM) NAIL INTERTROCHANTRIC;  Surgeon: Nicholes Stairs, MD;  Location: [facility];  Service: Orthopedics;  Laterality: Left;   KNEE ARTHROSCOPY Right 04/12/2007   KNEE ARTHROSCOPY Left    SQUAMOUS CELL CARCINOMA EXCISION     "neck"   TONSILLECTOMY       Allergies  Allergen Reactions   Omeprazole Other (See Comments)    siezure   Flagyl [Metronidazole] Nausea And Vomiting   Amiodarone    Aripiprazole Other (See Comments)    Unknown reaction   Hylan G-F 20 Other (See Comments)    Unknown reaction   Paroxetine Hcl Other (See Comments)    Headaches (a long time ago- pt doesn't really remember)   Statins Other (See Comments)    No specific reaction given-patient states that she was advised not to take by physician   Sulfa Antibiotics Diarrhea and Other (See Comments)    Colitis    Toprol Xl [Metoprolol Succinate] Other (See Comments)    Dizziness--pt doesn't really remember    Vancomycin Itching and Other (See Comments)    Pt reports that they gave it too fast- she started having  itching in the scalp   Penicillins Rash    DID THE REACTION INVOLVE: Swelling of the face/tongue/throat, SOB, or low BP? No Sudden or severe rash/hives, skin peeling, or the inside of the mouth or nose? Yes Did it require medical treatment? Unknown When did it last happen?Over 10 years If all above answers are NO, may proceed with cephalosporin use.       Family History  Problem Relation Age of Onset   Heart failure Mother    Leukemia Mother    Heart failure Father  Social History Ms. Menon reports that she quit smoking about 31 years ago. Her smoking use included cigarettes. She has a 20.00 pack-year smoking history. She has never used smokeless tobacco. Ms. Finks reports no history of alcohol use.   Review of Systems CONSTITUTIONAL: No weight loss, fever, chills, weakness or fatigue.  HEENT: Eyes: No visual loss, blurred vision, double vision or yellow sclerae.No hearing loss, sneezing, congestion, runny nose or sore throat.  SKIN: No rash or itching.  CARDIOVASCULAR: per hpi RESPIRATORY: No shortness of breath, cough or sputum.  GASTROINTESTINAL: No anorexia, nausea, vomiting or diarrhea. No abdominal pain or blood.  GENITOURINARY: No burning on urination, no polyuria NEUROLOGICAL: No headache, dizziness, syncope, paralysis, ataxia, numbness or tingling in the extremities. No change in bowel or bladder control.  MUSCULOSKELETAL: No muscle, back pain, joint pain or stiffness.  LYMPHATICS: No enlarged nodes. No history of splenectomy.  PSYCHIATRIC: No history of depression or anxiety.  ENDOCRINOLOGIC: No reports of sweating, cold or heat intolerance. No polyuria or polydipsia.  Marland Kitchen   Physical Examination Today's Vitals   06/14/19 1002  BP: 102/73  Pulse: 70  SpO2: 98%  Weight: 134 lb (60.8 kg)  Height: 5\' 5"  (1.651 m)   Body mass index is 22.3 kg/m.  Gen: resting comfortably, no acute distress HEENT: no scleral icterus, pupils equal round and  reactive, no palptable cervical adenopathy,  CV: RRR, no m/r/g, no jvd Resp: Clear to auscultation bilaterally GI: abdomen is soft, non-tender, non-distended, normal bowel sounds, no hepatosplenomegaly MSK: extremities are warm, no edema.  Skin: warm, no rash Neuro:  no focal deficits Psych: appropriate affect   Diagnostic Studies 11/2018 echo IMPRESSIONS   1. The left ventricle has mild-moderately reduced systolic function of 09-62%. The cavity size was normal. There is concentric left ventricular hypertrophy. Left ventricular diastology could not be evaluated secondary to atrial fibrillation. Elevated  left ventricular end-diastolic pressure Left ventricular diffuse hypokinesis. 2. The right ventricle has moderately reduced systolic function. The cavity was mildly enlarged. There is no increase in right ventricular wall thickness. 3. Left atrial size was mildly dilated. 4. Right atrial size was moderately dilated. 5. Moderate pleural effusion in the left lateral region. 6. The mitral valve is myxomatous. There is mild thickening. Mitral valve regurgitation is moderate by color flow Doppler. 7. The tricuspid valve is normal in structure. 8. The aortic valve is tricuspid Aortic valve regurgitation is mild by color flow Doppler. 9. There is dilatation of the aortic root. 10. The inferior vena cava was dilated in size with >50% respiratory variability.    Assessment and Plan  1.Afib with Tachy-brady syndrome S/p av nodal ablation with pacemaker placement - continues to do well without symptoms, continue to monitor.    2. Chronic systolic HF - remains euvolemic, she has done much better with fluid status now that her cardiac rhythm is stable - continue current meds - may repeat echo in near future to see if LVEF has improved, suspect prior tachy mediated CM.   F/u 6 months      Arnoldo Lenis, M.D.

## 2019-06-14 NOTE — Patient Instructions (Signed)

## 2019-06-20 ENCOUNTER — Other Ambulatory Visit: Payer: Self-pay | Admitting: Internal Medicine

## 2019-06-20 NOTE — Telephone Encounter (Signed)
Pt last saw Dr Harl Bowie 06/14/19, last labs 03/28/19 Creat 0.97, age 85, weight 60.8kg, based on specified criteria pt is on appropriate dosage of Eliquis 5mg  BID.  Will refill rx.

## 2019-07-03 ENCOUNTER — Ambulatory Visit: Payer: Medicare Other | Admitting: Gastroenterology

## 2019-07-03 ENCOUNTER — Encounter: Payer: Self-pay | Admitting: Gastroenterology

## 2019-07-03 ENCOUNTER — Other Ambulatory Visit: Payer: Self-pay

## 2019-07-03 VITALS — BP 108/78 | HR 71 | Temp 96.8°F | Ht 65.0 in | Wt 132.0 lb

## 2019-07-03 DIAGNOSIS — K5900 Constipation, unspecified: Secondary | ICD-10-CM | POA: Diagnosis not present

## 2019-07-03 NOTE — Patient Instructions (Addendum)
Let's increase fiber daily. Also, take the stool softener twice a day. (One capsule twice a day).  Call me in 2 weeks to let me know how this helps.  There are prescription medications we can use to help if no improvement!  Limit time on the toilet to only 2-3 minutes. Avoid straining.   We will see you in 3 months!  I enjoyed seeing you again today! As you know, I value our relationship and want to provide genuine, compassionate, and quality care. I welcome your feedback. If you receive a survey regarding your visit,  I greatly appreciate you taking time to fill this out. See you next time!  Annitta Needs, PhD, ANP-BC [facility]

## 2019-07-03 NOTE — Progress Notes (Signed)
Referring Provider: Celene Squibb, MD Primary Care Physician:  Celene Squibb, MD  Primary GI: Dr. Oneida Alar   Chief Complaint  Patient presents with  . Constipation    HPI:   Carol Hardin is an 85 y.o. female presenting today with a history of chronic constipation, reflux onset in March 2020 around time of starting amiodarone, and solid/liquid dysphagia. Previously desiring a non-invasive approach for management of upper GI concerns unless EGD absolutely necessary.   Dysphagia: BPE with age-appropriate esophageal dysmotility. She states she stopped Pepcid and only takes Tums if needed. Rare burping postprandially and will take Tums. Not eating sweets at night to avoid any reflux. Scrambled eggs, oatmeal, toast for breakfast. Denies dysphagia, stating this is not an issue currently. Will get busy during the day and appetite is not good at night.   Constipation: recommended to start Benefiber, add Miralax on any given day if needed. Recommended by Nutrition to also start fiber supplementation. Will take colace as needed, stating "not that often". Taking 3 dried prunes a day. Since starting with prunes, doesn't have to take Miralax. Has a BM daily, soft. Feels like there is pouch in her rectum that gets full and wonders if there is a way to fix this. Sometimes stool is unproductive. Sometimes hard.  Feels like this has been going on since March 2020. Last colonoscopy over 10 years ago. Believes this may have been at [facility].   Weight June 2020: 135. Today 132.   Past Medical History:  Diagnosis Date  . Anxiety   . Arthritis    "fingers, right toe" (08/09/2016)  . Basal cell carcinoma of lower leg, right   . Bradycardia    a. Holter 08/30/16 showed profound bradycardia down to 30 during awake hours, several 2 second pauses, very frequent PVCs >8000 in 48 hours, and NSVT (longest of 14 beats).  . Cardiomyopathy ([place])    a. EF 40% by echo 08/2016.  . Daily headache    "I usually wake up w/a  headache; sometimes it's a migraine" (08/09/2016)  . Depression   . Diverticulosis    Hx. of  . Frequent PVCs   . GERD (gastroesophageal reflux disease)   . Hypercholesterolemia   . Migraine   . Mitral regurgitation   . MVP (mitral valve prolapse)   . NSVT (nonsustained ventricular tachycardia) ([place])    a. first noted event monitor 08/2016.  . Osteoporosis   . PAF (paroxysmal atrial fibrillation) ([place])    a. in afib at time of echo 08/2016.  Marland Kitchen Pneumonia   . Scoliosis    mild  . Squamous cell carcinoma of neck     Past Surgical History:  Procedure Laterality Date  . AV NODE ABLATION N/A 01/23/2019   Procedure: AV NODE ABLATION;  Surgeon: Thompson Grayer, MD;  Location: [facility];  Service: Cardiovascular;  Laterality: N/A;  . BASAL CELL CARCINOMA EXCISION Right    RLE  . BIV PACEMAKER INSERTION CRT-P N/A 01/23/2019   Procedure: BIV PACEMAKER INSERTION CRT-P;  Surgeon: Thompson Grayer, MD;  Location: [facility] [place];  Service: Cardiovascular;  Laterality: N/A;  . BLEPHAROPLASTY    . BREAST BIOPSY Left   . BREAST CYST ASPIRATION Left   . BREAST CYST EXCISION Left   . BUNIONECTOMY Bilateral   . DILATION AND CURETTAGE OF UTERUS    . EXCISIONAL HEMORRHOIDECTOMY    . INTRAMEDULLARY (IM) NAIL INTERTROCHANTERIC Left 12/31/2017   Procedure: INTRAMEDULLARY (IM) NAIL INTERTROCHANTRIC;  Surgeon: Nicholes Stairs, MD;  Location: [facility];  Service: Orthopedics;  Laterality: Left;  . KNEE ARTHROSCOPY Right 04/12/2007  . KNEE ARTHROSCOPY Left   . SQUAMOUS CELL CARCINOMA EXCISION     "neck"  . TONSILLECTOMY      Current Outpatient Medications  Medication Sig Dispense Refill  . acetaminophen (TYLENOL) 325 MG tablet Take 650 mg by mouth every 6 (six) hours as needed.    . docusate sodium (COLACE) 100 MG capsule Take 100 mg by mouth daily as needed for mild constipation.    Marland Kitchen ELIQUIS 5 MG TABS tablet TAKE 1 TABLET(5 MG) BY MOUTH TWICE DAILY 60 tablet 6  . Emollient ([employer]  HAND & BODY) LOTN Apply 1 application topically daily as needed.    . Ensure (ENSURE) Take 237 mLs by mouth daily.     . furosemide (LASIX) 20 MG tablet Take 20 mg by mouth as needed for fluid.    Marland Kitchen LORazepam (ATIVAN) 1 MG tablet Take 1 mg by mouth at bedtime.    [name] Glycol-Propyl Glycol (SYSTANE OP) Apply to eye as needed.    . Polyethylene Glycol 3350 (MIRALAX PO) Take 1 Dose by mouth as needed (constipation).     . Wheat Dextrin (BENEFIBER DRINK MIX PO) Take 1 Dose by mouth as needed (constipation).      No current facility-administered medications for this visit.     Allergies as of 07/03/2019 - Review Complete 07/03/2019  Allergen Reaction Noted  . Omeprazole Other (See Comments) 09/01/2016  . Flagyl [metronidazole] Nausea And Vomiting 08/19/2016  . Amiodarone  03/30/2019  . Aripiprazole Other (See Comments) 10/09/2013  . Hylan g-f 20 Other (See Comments) 10/09/2013  . Paroxetine hcl Other (See Comments) 04/23/2011  . Statins Other (See Comments) 10/18/2018  . Sulfa antibiotics Diarrhea and Other (See Comments) 05/19/2018  . Toprol xl [metoprolol succinate] Other (See Comments) 04/23/2011  . Vancomycin Itching and Other (See Comments) 04/23/2011  . Penicillins Rash     Family History  Problem Relation Age of Onset  . Heart failure Mother   . Leukemia Mother   . Heart failure Father     Social History   Socioeconomic History  . Marital status: Married    Spouse name: Not on file  . Number of children: Not on file  . Years of education: Not on file  . Highest education level: Not on file  Occupational History  . Not on file  Social Needs  . Financial resource strain: Somewhat hard  . Food insecurity    Worry: Never true    Inability: Never true  . Transportation needs    Medical: No    Non-medical: No  Tobacco Use  . Smoking status: Former Smoker    Packs/day: 1.00    Years: 20.00    Pack years: 20.00    Types: Cigarettes    Quit date: 1989     Years since quitting: 31.7  . Smokeless tobacco: Never Used  Substance and Sexual Activity  . Alcohol use: No  . Drug use: No  . Sexual activity: Not Currently  Lifestyle  . Physical activity    Days per week: 0 days    Minutes per session: 0 min  . Stress: Rather much  Relationships  . Social Herbalist on phone: Once a week    Gets together: Once a week    Attends religious service: Never    Active member of club or organization: No  Attends meetings of clubs or organizations: Never    Relationship status: Married  Other Topics Concern  . Not on file  Social History Narrative  . Not on file    Review of Systems: Gen: Denies fever, chills, anorexia. Denies fatigue, weakness, weight loss.  CV: Denies chest pain, palpitations, syncope, peripheral edema, and claudication. Resp: Denies dyspnea at rest, cough, wheezing, coughing up blood, and pleurisy. GI: see HPI Derm: Denies rash, itching, dry skin Psych: Denies depression, anxiety, memory loss, confusion. No homicidal or suicidal ideation.  Heme: Denies bruising, bleeding, and enlarged lymph nodes.  Physical Exam: BP 108/78   Pulse 71   Temp (!) 96.8 F (36 C) (Temporal)   Ht 5\' 5"  (1.651 m)   Wt 132 lb (59.9 kg)   BMI 21.97 kg/m  General:   Alert and oriented. No distress noted. Pleasant and cooperative.  Head:  Normocephalic and atraumatic. Eyes:  Conjuctiva clear without scleral icterus. Abdomen:  +BS, soft, non-tender and non-distended. No rebound or guarding. No HSM or masses noted. Rectal: external hemorrhoid tags. No mass. Stool pieces in rectal vault.  Msk: kyphosis  Extremities:  Without edema. Neurologic:  Alert and  oriented x4 Psych:  Alert and cooperative. Normal mood and affect.

## 2019-07-04 ENCOUNTER — Ambulatory Visit
Admission: EM | Admit: 2019-07-04 | Discharge: 2019-07-04 | Disposition: A | Discharge status: Home / Self Care | Payer: Medicare Other

## 2019-07-04 ENCOUNTER — Other Ambulatory Visit: Payer: Self-pay

## 2019-07-04 DIAGNOSIS — L089 Local infection of the skin and subcutaneous tissue, unspecified: Secondary | ICD-10-CM | POA: Diagnosis not present

## 2019-07-04 DIAGNOSIS — E559 Vitamin D deficiency, unspecified: Secondary | ICD-10-CM | POA: Diagnosis not present

## 2019-07-04 DIAGNOSIS — S41102A Unspecified open wound of left upper arm, initial encounter: Secondary | ICD-10-CM

## 2019-07-04 DIAGNOSIS — W57XXXA Bitten or stung by nonvenomous insect and other nonvenomous arthropods, initial encounter: Secondary | ICD-10-CM

## 2019-07-04 DIAGNOSIS — M816 Localized osteoporosis [Lequesne]: Secondary | ICD-10-CM | POA: Diagnosis not present

## 2019-07-04 DIAGNOSIS — N958 Other specified menopausal and perimenopausal disorders: Secondary | ICD-10-CM | POA: Diagnosis not present

## 2019-07-04 DIAGNOSIS — S40262A Insect bite (nonvenomous) of left shoulder, initial encounter: Secondary | ICD-10-CM | POA: Diagnosis not present

## 2019-07-04 DIAGNOSIS — S40862A Insect bite (nonvenomous) of left upper arm, initial encounter: Secondary | ICD-10-CM | POA: Diagnosis not present

## 2019-07-04 DIAGNOSIS — Z6822 Body mass index (BMI) 22.0-22.9, adult: Secondary | ICD-10-CM | POA: Diagnosis not present

## 2019-07-04 NOTE — ED Triage Notes (Signed)
Pt was purchasing rose bush and thinks she got bit by bug, pt has 2 small spots on left arm

## 2019-07-04 NOTE — ED Provider Notes (Signed)
[facility]   [id number] 07/04/19 Arrival Time: 1223  CC: Wound; bug bite  SUBJECTIVE:  Carol Hardin is a 85 y.o. female hx significant for being on eliquis, who presents with two wounds to left arm x few days.  Symptoms began after purchasing rose bushes, speculates a bug may have bit her.  Localizes the wounds to left arm.  Denies pain,itching, or active bleeding.  Has tried peroxide with relief.  Denies aggravating factors.  Denies similar symptoms in the past.   Denies fever, chills, nausea, vomiting, erythema, swelling, discharge, SOB, chest pain, abdominal pain, changes in bowel or bladder function.    ROS: As per HPI.  All other pertinent ROS negative.     Past Medical History:  Diagnosis Date  . Anxiety   . Arthritis    "fingers, right toe" (08/09/2016)  . Basal cell carcinoma of lower leg, right   . Bradycardia    a. Holter 08/30/16 showed profound bradycardia down to 30 during awake hours, several 2 second pauses, very frequent PVCs >8000 in 48 hours, and NSVT (longest of 14 beats).  . Cardiomyopathy ([place])    a. EF 40% by echo 08/2016.  . Daily headache    "I usually wake up w/a headache; sometimes it's a migraine" (08/09/2016)  . Depression   . Diverticulosis    Hx. of  . Frequent PVCs   . GERD (gastroesophageal reflux disease)   . Hypercholesterolemia   . Migraine   . Mitral regurgitation   . MVP (mitral valve prolapse)   . NSVT (nonsustained ventricular tachycardia) ([place])    a. first noted event monitor 08/2016.  . Osteoporosis   . PAF (paroxysmal atrial fibrillation) ([place])    a. in afib at time of echo 08/2016.  Marland Kitchen Pneumonia   . Scoliosis    mild  . Squamous cell carcinoma of neck    Past Surgical History:  Procedure Laterality Date  . AV NODE ABLATION N/A 01/23/2019   Procedure: AV NODE ABLATION;  Surgeon: Thompson Grayer, MD;  Location: [facility];  Service: Cardiovascular;  Laterality: N/A;  . BASAL CELL CARCINOMA EXCISION Right    RLE  . BIV PACEMAKER INSERTION CRT-P N/A 01/23/2019   Procedure: BIV PACEMAKER INSERTION CRT-P;  Surgeon: Thompson Grayer, MD;  Location: [facility];  Service: Cardiovascular;  Laterality: N/A;  . BLEPHAROPLASTY    . BREAST BIOPSY Left   . BREAST CYST ASPIRATION Left   . BREAST CYST EXCISION Left   . BUNIONECTOMY Bilateral   . DILATION AND CURETTAGE OF UTERUS    . EXCISIONAL HEMORRHOIDECTOMY    . INTRAMEDULLARY (IM) NAIL INTERTROCHANTERIC Left 12/31/2017   Procedure: INTRAMEDULLARY (IM) NAIL INTERTROCHANTRIC;  Surgeon: Nicholes Stairs, MD;  Location: [facility];  Service: Orthopedics;  Laterality: Left;  . KNEE ARTHROSCOPY Right 04/12/2007  . KNEE ARTHROSCOPY Left   . SQUAMOUS CELL CARCINOMA EXCISION     "neck"  . TONSILLECTOMY     Allergies  Allergen Reactions  . Omeprazole Other (See Comments)    siezure  . Flagyl [Metronidazole] Nausea And Vomiting  . Amiodarone   . Aripiprazole Other (See Comments)    Unknown reaction  . Hylan G-F 20 Other (See Comments)    Unknown reaction  . Paroxetine Hcl Other (See Comments)    Headaches (a long time ago- pt doesn't really remember)  . Statins Other (See Comments)    No specific reaction given-patient states that she was advised not to take by  physician  . Sulfa Antibiotics Diarrhea and Other (See Comments)    Colitis   . Toprol Xl [Metoprolol Succinate] Other (See Comments)    Dizziness--pt doesn't really remember   . Vancomycin Itching and Other (See Comments)    Pt reports that they gave it too fast- she started having itching in the scalp  . Penicillins Rash    DID THE REACTION INVOLVE: Swelling of the face/tongue/throat, SOB, or low BP? No Sudden or severe rash/hives, skin peeling, or the inside of the mouth or nose? Yes Did it require medical treatment? Unknown When did it last happen?Over 10 years If all above answers are "NO", may proceed with cephalosporin use.    No current facility-administered medications  on file prior to encounter.    Current Outpatient Medications on File Prior to Encounter  Medication Sig Dispense Refill  . acetaminophen (TYLENOL) 325 MG tablet Take 650 mg by mouth every 6 (six) hours as needed.    . docusate sodium (COLACE) 100 MG capsule Take 100 mg by mouth daily as needed for mild constipation.    Marland Kitchen ELIQUIS 5 MG TABS tablet TAKE 1 TABLET(5 MG) BY MOUTH TWICE DAILY 60 tablet 6  . Emollient ([employer] HAND & BODY) LOTN Apply 1 application topically daily as needed.    . Ensure (ENSURE) Take 237 mLs by mouth daily.     . furosemide (LASIX) 20 MG tablet Take 20 mg by mouth as needed for fluid.    Marland Kitchen LORazepam (ATIVAN) 1 MG tablet Take 1 mg by mouth at bedtime.    [name] Glycol-Propyl Glycol (SYSTANE OP) Apply to eye as needed.    . Polyethylene Glycol 3350 (MIRALAX PO) Take 1 Dose by mouth as needed (constipation).     . Wheat Dextrin (BENEFIBER DRINK MIX PO) Take 1 Dose by mouth as needed (constipation).      Social History   Socioeconomic History  . Marital status: Married    Spouse name: Not on file  . Number of children: Not on file  . Years of education: Not on file  . Highest education level: Not on file  Occupational History  . Not on file  Social Needs  . Financial resource strain: Somewhat hard  . Food insecurity    Worry: Never true    Inability: Never true  . Transportation needs    Medical: No    Non-medical: No  Tobacco Use  . Smoking status: Former Smoker    Packs/day: 1.00    Years: 20.00    Pack years: 20.00    Types: Cigarettes    Quit date: 1989    Years since quitting: 31.7  . Smokeless tobacco: Never Used  Substance and Sexual Activity  . Alcohol use: No  . Drug use: No  . Sexual activity: Not Currently  Lifestyle  . Physical activity    Days per week: 0 days    Minutes per session: 0 min  . Stress: Rather much  Relationships  . Social Herbalist on phone: Once a week    Gets together: Once a week    Attends  religious service: Never    Active member of club or organization: No    Attends meetings of clubs or organizations: Never    Relationship status: Married  . Intimate partner violence    Fear of current or ex partner: No    Emotionally abused: No    Physically abused: No    Forced sexual  activity: No  Other Topics Concern  . Not on file  Social History Narrative  . Not on file   Family History  Problem Relation Age of Onset  . Heart failure Mother   . Leukemia Mother   . Heart failure Father     OBJECTIVE: Vitals:   07/04/19 1236  BP: 104/74  Pulse: 73  Resp: 18  Temp: 97.7 F (36.5 C)  SpO2: 99%    General appearance: alert; no distress Head: NCAT Lungs: normal respiratory effort Heart: Radial pulse 2+ bilaterally Extremities: no edema Skin: warm and dry; two superficial punctate wounds with scab formation to left posterior hand and anterior forearm; NTTP; bleeding controlled; no erythema, discharge or odor present Psychological: alert and cooperative; normal mood and affect  ASSESSMENT & PLAN:  1. Nonvenomous insect bite of left shoulder and upper arm with infection, initial encounter   2. Open wound of left upper arm, initial encounter    Wash with mild soap and warm water Avoid scratching or picking at sites You may apply neosporin as needed Follow up with PCP as needed for recheck Return or go to the ER if you have any new or worsening symptoms such as fever, chills, nausea, vomiting, redness, swelling, discharge, if symptoms do not improve with medications, etc...  Reviewed expectations re: course of current medical issues. Questions answered. Outlined signs and symptoms indicating need for more acute intervention. Patient verbalized understanding. After Visit Summary given.   Lestine Box, PA-C 07/04/19 1306

## 2019-07-04 NOTE — Discharge Instructions (Signed)
Wash with mild soap and warm water Avoid scratching or picking at sites You may apply neosporin as needed Follow up with PCP as needed for recheck Return or go to the ER if you have any new or worsening symptoms such as fever, chills, nausea, vomiting, redness, swelling, discharge, if symptoms do not improve with medications, etc..Marland Kitchen

## 2019-07-04 NOTE — Assessment & Plan Note (Signed)
Not ideally managed. Increase fiber to BID, increase stool softener to BID. Call if no improvement in this. Rectal exam today unrevealing. Will see her in close follow-up 3 months from now. May need to trial Amitiza. She wants to avoid prescriptive agents for now if able.

## 2019-07-11 ENCOUNTER — Encounter: Payer: Self-pay | Admitting: Nutrition

## 2019-07-11 NOTE — Patient Instructions (Signed)
Goals Eat three balanced meals Nutrient dense snacks between meals Choose lower sodium foods Increase higher fiber and more fresh fruits and vegetables Avoid fried and greasy foods, acidic foods Gain 2-3 lbs per month

## 2019-07-13 ENCOUNTER — Other Ambulatory Visit: Payer: Self-pay

## 2019-07-13 NOTE — Patient Outreach (Signed)
[facility]) Care Management  07/13/2019  CARLYANN PLACIDE 1933/06/25 [id number]     Attempted routine outreach. Left voice message requesting a return call.    PLAN -Will follow-up within 3-4 business days.   [facility] [phone]

## 2019-07-14 DIAGNOSIS — R0902 Hypoxemia: Secondary | ICD-10-CM | POA: Diagnosis not present

## 2019-07-14 DIAGNOSIS — R0602 Shortness of breath: Secondary | ICD-10-CM | POA: Diagnosis not present

## 2019-07-14 DIAGNOSIS — R0600 Dyspnea, unspecified: Secondary | ICD-10-CM | POA: Diagnosis not present

## 2019-07-14 DIAGNOSIS — I5020 Unspecified systolic (congestive) heart failure: Secondary | ICD-10-CM | POA: Diagnosis not present

## 2019-07-17 ENCOUNTER — Telehealth: Payer: Self-pay | Admitting: Gastroenterology

## 2019-07-17 NOTE — Telephone Encounter (Signed)
Pt had her fiber and stool softner increased to bid at last visit with Roseanne Kaufman, NP.  She is still not doing her best with BM's. She has some samples of Metamucil given to her from [facility] by her son. She wants to know if Vicente Males would recommend her to try that. Please advise, Vicente Males.

## 2019-07-17 NOTE — Telephone Encounter (Signed)
LMOM to call.

## 2019-07-17 NOTE — Telephone Encounter (Signed)
Yes, she can try Metamucil. Have her give [place] an update in 1 week. We may need to try prescriptive agents.

## 2019-07-17 NOTE — Telephone Encounter (Signed)
Pt is aware to try metamucil and let us hear an update in a week.

## 2019-07-17 NOTE — Telephone Encounter (Signed)
Pt called to speak with the nurse. [phone]

## 2019-07-20 ENCOUNTER — Other Ambulatory Visit: Payer: Self-pay

## 2019-07-20 NOTE — Patient Outreach (Signed)
[facility]) Care Management  07/20/2019  Carol Hardin [birth year] [id number]    Received a voice message from [name] acknowledging receipt of the previous call. Will follow-up next week.  [facility] [phone]

## 2019-07-27 ENCOUNTER — Telehealth: Payer: Self-pay | Admitting: Physician Assistant

## 2019-07-27 NOTE — Telephone Encounter (Signed)
Patient paged the after hour answering service worried that she has used her cell phone without realizing for 5 min. She is worried her cell phone signal will cause damage to her PPM. I reassured her that her cell phone signal should not cause damage to her pacemaker.

## 2019-08-02 ENCOUNTER — Telehealth: Payer: Self-pay | Admitting: *Deleted

## 2019-08-02 NOTE — Telephone Encounter (Signed)
Pt called to let us know that how she was doing.  Pt says Metamucil is helping with bm's. She has not been taking stool softeners that much.  No constipation now.  Pt says she is seeing smaller bm's now and twice a day.  She says she feels like her stomach feels upset occasionally.  Pt wants to know if Align would be alright to take.  [phone]

## 2019-08-06 NOTE — Telephone Encounter (Signed)
Yes, she can take Align. Can always titrate up the Metamucil if needed.

## 2019-08-06 NOTE — Telephone Encounter (Signed)
Forwarding to Roseanne Kaufman, NP to advise!

## 2019-08-06 NOTE — Telephone Encounter (Signed)
PT is aware.

## 2019-08-10 ENCOUNTER — Other Ambulatory Visit: Payer: Self-pay

## 2019-08-13 ENCOUNTER — Ambulatory Visit (INDEPENDENT_AMBULATORY_CARE_PROVIDER_SITE_OTHER): Payer: Medicare Other | Admitting: *Deleted

## 2019-08-13 ENCOUNTER — Telehealth: Payer: Self-pay | Admitting: Internal Medicine

## 2019-08-13 DIAGNOSIS — I495 Sick sinus syndrome: Secondary | ICD-10-CM

## 2019-08-13 DIAGNOSIS — I4891 Unspecified atrial fibrillation: Secondary | ICD-10-CM

## 2019-08-13 LAB — CUP PACEART REMOTE DEVICE CHECK
Battery Remaining Longevity: 77 mo
Battery Remaining Percentage: 95.5 %
Battery Voltage: 3.01 V
Brady Statistic AP VP Percent: 0 %
Brady Statistic AP VS Percent: 0 %
Brady Statistic AS VP Percent: 0 %
Brady Statistic AS VS Percent: 0 %
Brady Statistic RA Percent Paced: 1 %
Date Time Interrogation Session: 20201102060016
Implantable Lead Implant Date: 20200414
Implantable Lead Implant Date: 20200414
Implantable Lead Implant Date: 20200414
Implantable Lead Location: 753858
Implantable Lead Location: 753859
Implantable Lead Location: 753860
Implantable Pulse Generator Implant Date: 20200414
Lead Channel Impedance Value: 460 Ohm
Lead Channel Impedance Value: 530 Ohm
Lead Channel Impedance Value: 700 Ohm
Lead Channel Pacing Threshold Amplitude: 0.5 V
Lead Channel Pacing Threshold Amplitude: 1.5 V
Lead Channel Pacing Threshold Pulse Width: 0.5 ms
Lead Channel Pacing Threshold Pulse Width: 0.5 ms
Lead Channel Sensing Intrinsic Amplitude: 1.7 mV
Lead Channel Sensing Intrinsic Amplitude: 9.1 mV
Lead Channel Setting Pacing Amplitude: 2.5 V
Lead Channel Setting Pacing Amplitude: 2.5 V
Lead Channel Setting Pacing Amplitude: 3.5 V
Lead Channel Setting Pacing Pulse Width: 0.5 ms
Lead Channel Setting Pacing Pulse Width: 0.5 ms
Lead Channel Setting Sensing Sensitivity: 3 mV
Pulse Gen Serial Number: 9431558

## 2019-08-13 NOTE — Telephone Encounter (Signed)
° ° ° °  1. Has your device fired? NO  2. Is you device beeping? NO 3. Are you experiencing draining or swelling at device site? NO  4. Are you calling to see if we received your device transmission? YES, PATIENT ALSO REQUESTING RESULTS   5. Have you passed out? NO    Please route to [place]

## 2019-08-13 NOTE — Telephone Encounter (Signed)
Patient called with concerns because she received my chart message that she reports noted she had been seen today. Patient had remote transmission that was received today. Explained how remotes are scheduled as appointments to ensure that we contact patient in the event transmission was not received.  Transmission showed device function WNL and AF with controlled v-rates. Explained that transmission did not show anything new and patient is on Eliquis and being seen by AF clinic. She expressed her concern about the AF because that she hand I explained that she has had episodes since 2017. Reports concerns about not feeling better since PPM implant. C/O SOB and "tiredness" with house work and yard work. No reports of CP, syncope, or dizziness. She would like her medical records and explained she would have to request those through or office. She expressed concerns about her weight loss and that she has had back bain with her house work. Recommended she follow up with PCP concerning back pain and weight loss. Patient reassured that PPM functioning appropriatly and her remote monitor is functioning to send alerts concerning her PPM function. Reassured patient that I would relay her concerns about her care and the lack of energy to Dr Rayann Heman.

## 2019-08-14 DIAGNOSIS — I5020 Unspecified systolic (congestive) heart failure: Secondary | ICD-10-CM | POA: Diagnosis not present

## 2019-08-14 DIAGNOSIS — R0902 Hypoxemia: Secondary | ICD-10-CM | POA: Diagnosis not present

## 2019-08-14 DIAGNOSIS — R0600 Dyspnea, unspecified: Secondary | ICD-10-CM | POA: Diagnosis not present

## 2019-08-14 DIAGNOSIS — R0602 Shortness of breath: Secondary | ICD-10-CM | POA: Diagnosis not present

## 2019-08-20 ENCOUNTER — Emergency Department (HOSPITAL_COMMUNITY)
Admission: EM | Admit: 2019-08-20 | Discharge: 2019-08-20 | Disposition: A | Discharge status: Home / Self Care | Payer: Medicare Other | Attending: Emergency Medicine | Admitting: Emergency Medicine

## 2019-08-20 ENCOUNTER — Telehealth: Payer: Self-pay | Admitting: Gastroenterology

## 2019-08-20 ENCOUNTER — Encounter (HOSPITAL_COMMUNITY): Payer: Self-pay | Admitting: Emergency Medicine

## 2019-08-20 ENCOUNTER — Emergency Department (HOSPITAL_COMMUNITY): Payer: Medicare Other

## 2019-08-20 ENCOUNTER — Other Ambulatory Visit: Payer: Self-pay

## 2019-08-20 DIAGNOSIS — I5030 Unspecified diastolic (congestive) heart failure: Secondary | ICD-10-CM | POA: Diagnosis not present

## 2019-08-20 DIAGNOSIS — Z7901 Long term (current) use of anticoagulants: Secondary | ICD-10-CM | POA: Insufficient documentation

## 2019-08-20 DIAGNOSIS — N183 Chronic kidney disease, stage 3 unspecified: Secondary | ICD-10-CM | POA: Insufficient documentation

## 2019-08-20 DIAGNOSIS — Z8673 Personal history of transient ischemic attack (TIA), and cerebral infarction without residual deficits: Secondary | ICD-10-CM | POA: Insufficient documentation

## 2019-08-20 DIAGNOSIS — I130 Hypertensive heart and chronic kidney disease with heart failure and stage 1 through stage 4 chronic kidney disease, or unspecified chronic kidney disease: Secondary | ICD-10-CM | POA: Diagnosis not present

## 2019-08-20 DIAGNOSIS — Z87891 Personal history of nicotine dependence: Secondary | ICD-10-CM | POA: Insufficient documentation

## 2019-08-20 DIAGNOSIS — Z79899 Other long term (current) drug therapy: Secondary | ICD-10-CM | POA: Insufficient documentation

## 2019-08-20 DIAGNOSIS — Z950 Presence of cardiac pacemaker: Secondary | ICD-10-CM | POA: Insufficient documentation

## 2019-08-20 DIAGNOSIS — R1032 Left lower quadrant pain: Secondary | ICD-10-CM | POA: Insufficient documentation

## 2019-08-20 DIAGNOSIS — K5790 Diverticulosis of intestine, part unspecified, without perforation or abscess without bleeding: Secondary | ICD-10-CM | POA: Diagnosis not present

## 2019-08-20 DIAGNOSIS — K5730 Diverticulosis of large intestine without perforation or abscess without bleeding: Secondary | ICD-10-CM | POA: Diagnosis not present

## 2019-08-20 LAB — URINALYSIS, ROUTINE W REFLEX MICROSCOPIC
Bacteria, UA: NONE SEEN
Bilirubin Urine: NEGATIVE
Glucose, UA: NEGATIVE mg/dL
Ketones, ur: 5 mg/dL — AB
Leukocytes,Ua: NEGATIVE
Nitrite: NEGATIVE
Protein, ur: NEGATIVE mg/dL
Specific Gravity, Urine: >1.046 — ABNORMAL HIGH (ref 1.005–1.030)
pH: 6 (ref 5.0–8.0)

## 2019-08-20 LAB — CBC WITH DIFFERENTIAL/PLATELET
Abs Immature Granulocytes: 0.01 10*3/uL (ref 0.00–0.07)
Basophils Absolute: 0 10*3/uL (ref 0.0–0.1)
Basophils Relative: 1 %
Eosinophils Absolute: 0.1 10*3/uL (ref 0.0–0.5)
Eosinophils Relative: 2 %
HCT: 41.2 % (ref 36.0–46.0)
Hemoglobin: 12.8 g/dL (ref 12.0–15.0)
Immature Granulocytes: 0 %
Lymphocytes Relative: 21 %
Lymphs Abs: 1.2 10*3/uL (ref 0.7–4.0)
MCH: 31.9 pg (ref 26.0–34.0)
MCHC: 31.1 g/dL (ref 30.0–36.0)
MCV: 102.7 fL — ABNORMAL HIGH (ref 80.0–100.0)
Monocytes Absolute: 0.6 10*3/uL (ref 0.1–1.0)
Monocytes Relative: 11 %
Neutro Abs: 3.7 10*3/uL (ref 1.7–7.7)
Neutrophils Relative %: 65 %
Platelets: 194 10*3/uL (ref 150–400)
RBC: 4.01 MIL/uL (ref 3.87–5.11)
RDW: 14.3 % (ref 11.5–15.5)
WBC: 5.7 10*3/uL (ref 4.0–10.5)
nRBC: 0 % (ref 0.0–0.2)

## 2019-08-20 LAB — COMPREHENSIVE METABOLIC PANEL
ALT: 29 U/L (ref 0–44)
AST: 39 U/L (ref 15–41)
Albumin: 4 g/dL (ref 3.5–5.0)
Alkaline Phosphatase: 63 U/L (ref 38–126)
Anion gap: 8 (ref 5–15)
BUN: 18 mg/dL (ref 8–23)
CO2: 24 mmol/L (ref 22–32)
Calcium: 9.4 mg/dL (ref 8.9–10.3)
Chloride: 105 mmol/L (ref 98–111)
Creatinine, Ser: 0.97 mg/dL (ref 0.44–1.00)
GFR calc Af Amer: >60 mL/min (ref >60)
GFR calc non Af Amer: 54 mL/min — ABNORMAL LOW (ref >60)
Glucose, Bld: 111 mg/dL — ABNORMAL HIGH (ref 70–99)
Potassium: 4.3 mmol/L (ref 3.5–5.1)
Sodium: 137 mmol/L (ref 135–145)
Total Bilirubin: 1.2 mg/dL (ref 0.3–1.2)
Total Protein: 7.5 g/dL (ref 6.5–8.1)

## 2019-08-20 MED ORDER — SODIUM CHLORIDE 0.9 % IV BOLUS
500.0000 mL | Freq: Once | INTRAVENOUS | Status: AC
  Administered 2019-08-20: 500 mL via INTRAVENOUS

## 2019-08-20 MED ORDER — IOHEXOL 300 MG/ML  SOLN
100.0000 mL | Freq: Once | INTRAMUSCULAR | Status: AC | PRN
  Administered 2019-08-20: 100 mL via INTRAVENOUS

## 2019-08-20 NOTE — Telephone Encounter (Signed)
Per Erline Levine, she meant to send it to me.

## 2019-08-20 NOTE — ED Notes (Signed)
Pt transported to CT ?

## 2019-08-20 NOTE — Telephone Encounter (Signed)
I called pt and she said she has spoken to her PCP and her PCP realized right away that she was short of breath. She said she has been having pain in her LLQ and it gets really bad at times and she thought that she was going to have to go to the ED yesterday. The pain did ease up some, but she is still short of breath and feels a growling sound in her abdomen. Said she is not constipated. She did have a little upset stomach in the last day or so. Had pork chops one day and spaghetti the next and it did not set well with her. She realizes she is short of breath and this is unusual for her. I told her she should go to the ED and be checked out for the shortness of breath and the abdominal pain. She said she will go.  Sending FYI to Roseanne Kaufman, NP.

## 2019-08-20 NOTE — Telephone Encounter (Signed)
[phone] please call patient, she thinks she is having a diverticulitis flair up

## 2019-08-20 NOTE — Discharge Instructions (Signed)
Your testing today does not show any signs of diverticulitis or urinary infection or any other surgical causes.  You should go to your doctor if you have ongoing symptoms or come back to the hospital for severe or worsening symptoms.  Drink plenty of fluids

## 2019-08-20 NOTE — ED Triage Notes (Signed)
PT STATES THAT SHE IS HAVING PAIN IN HER LEFT SIDE THAT IS TAKING HER BREATH IT HAS BEEN GOING ON FOR THE PAST 3 DAYS.

## 2019-08-20 NOTE — ED Provider Notes (Signed)
[facility] EMERGENCY DEPARTMENT Provider Note   CSN: [id number] Arrival date & time: 08/20/19  1543     History   Chief Complaint Chief Complaint  Patient presents with   Abdominal Pain    HPI Carol Hardin is a 85 y.o. female.     HPI  This patient is an 85 year old female, she has a known history of diverticulitis and reports a history of diverticulitis in the past that required admission to the hospital.  She also has some chronic constipation.  She is on Eliquis for paroxysmal A. fib.  Over the last several days she has had some abdominal discomfort mostly on the left side, associated with some excessive burping and an episode of diarrhea.  She feels like she has a GERD growling gurgling sensation on the left abdomen.  This is similar to what she had in the past with diverticulitis.  She does not have any fevers or chills and has not had any vomiting.  The pain is never on the right.  Symptoms are persistent, gradually worsening, she has not seen her family doctor for this.  After some discussion she now states that she has had some urinary urgency and incontinence since this pain started.  Past Medical History:  Diagnosis Date   Anxiety    Arthritis    "fingers, right toe" (08/09/2016)   Basal cell carcinoma of lower leg, right    Bradycardia    a. Holter 08/30/16 showed profound bradycardia down to 30 during awake hours, several 2 second pauses, very frequent PVCs >8000 in 48 hours, and NSVT (longest of 14 beats).   Cardiomyopathy (IXL)    a. EF 40% by echo 08/2016.   Daily headache    "I usually wake up w/a headache; sometimes it's a migraine" (08/09/2016)   Depression    Diverticulosis    Hx. of   Frequent PVCs    GERD (gastroesophageal reflux disease)    Hypercholesterolemia    Migraine    Mitral regurgitation    MVP (mitral valve prolapse)    NSVT (nonsustained ventricular tachycardia) ([place])    a. first noted event monitor 08/2016.    Osteoporosis    PAF (paroxysmal atrial fibrillation) ([place])    a. in afib at time of echo 08/2016.   Pneumonia    Scoliosis    mild   Squamous cell carcinoma of neck     Patient Active Problem List   Diagnosis Date Noted   Dysphagia 03/30/2019   Constipation 03/30/2019   Pacemaker    Chronic respiratory failure with hypoxia (HCC)    On home O2    Sick sinus syndrome ([place]) 01/22/2019   CKD (chronic kidney disease) stage 3, GFR 30-59 ml/min 01/21/2019   Atrial fibrillation with RVR ([place]) 01/20/2019   Acute on chronic systolic CHF (congestive heart failure) ([place]) 12/25/2018   Tachy-brady syndrome ([place]) 12/25/2018   Atrial fibrillation, chronic ([place]) 12/24/2018   Congestive heart failure with left ventricular diastolic dysfunction, unspecified failure chronicity ([place]) 12/24/2018   Malnutrition of moderate degree 11/20/2018   Atrial fibrillation with rapid ventricular response ([place]) 11/18/2018   GERD (gastroesophageal reflux disease) 11/18/2018   CAP (community acquired pneumonia) 11/18/2018   Acute on chronic diastolic HF (heart failure) ([place]) 11/18/2018   Stroke (cerebrum) ([place]) 05/19/2018   Hip fracture ([place]) 12/31/2017   PAF (paroxysmal atrial fibrillation) (HCC)    NSVT (nonsustained ventricular tachycardia) (HCC)    Mitral regurgitation    Frequent PVCs    Bradycardia  Essential hypertension    Hypokalemia    Diverticulitis 08/09/2016   Diverticulosis of colon 04/22/2016   Diverticulosis of large intestine without perforation or abscess without bleeding 04/22/2016   Generalized anxiety disorder 04/22/2016   Irritable bowel syndrome without diarrhea 04/22/2016   Mitral valve prolapse 04/22/2016   Mixed hyperlipidemia 04/22/2016   Osteopenia 04/22/2016   Low blood pressure 09/24/2014   Hypercholesterolemia    History of depression    Scoliosis    Osteoporosis    DYSPNEA 05/31/2008    Past Surgical History:  Procedure  Laterality Date   AV NODE ABLATION N/A 01/23/2019   Procedure: AV NODE ABLATION;  Surgeon: Thompson Grayer, MD;  Location: [place] [facility];  Service: Cardiovascular;  Laterality: N/A;   BASAL CELL CARCINOMA EXCISION Right    RLE   BIV PACEMAKER INSERTION CRT-P N/A 01/23/2019   Procedure: BIV PACEMAKER INSERTION CRT-P;  Surgeon: Thompson Grayer, MD;  Location: [place] [facility];  Service: Cardiovascular;  Laterality: N/A;   BLEPHAROPLASTY     BREAST BIOPSY Left    BREAST CYST ASPIRATION Left    BREAST CYST EXCISION Left    BUNIONECTOMY Bilateral    DILATION AND CURETTAGE OF UTERUS     EXCISIONAL HEMORRHOIDECTOMY     INTRAMEDULLARY (IM) NAIL INTERTROCHANTERIC Left 12/31/2017   Procedure: INTRAMEDULLARY (IM) NAIL INTERTROCHANTRIC;  Surgeon: Nicholes Stairs, MD;  Location: [place];  Service: Orthopedics;  Laterality: Left;   KNEE ARTHROSCOPY Right 04/12/2007   KNEE ARTHROSCOPY Left    SQUAMOUS CELL CARCINOMA EXCISION     "neck"   TONSILLECTOMY       OB History    Gravida  6   Para  4   Term  4   Preterm      AB  2   Living        SAB  2   TAB      Ectopic      Multiple      Live Births               Home Medications    Prior to Admission medications   Medication Sig Start Date End Date Taking? Authorizing Provider  acetaminophen (TYLENOL) 325 MG tablet Take 650 mg by mouth every 6 (six) hours as needed.    [provider]  docusate sodium (COLACE) 100 MG capsule Take 100 mg by mouth daily as needed for mild constipation.    [provider]  ELIQUIS 5 MG TABS tablet TAKE 1 TABLET(5 MG) BY MOUTH TWICE DAILY 06/20/19   Allred, Jeneen Rinks, MD  Emollient [facility] AG HAND & BODY) LOTN Apply 1 application topically daily as needed.    [provider]  Ensure (ENSURE) Take 237 mLs by mouth daily.     [provider]  furosemide (LASIX) 20 MG tablet Take 20 mg by mouth as needed for fluid.    [provider]    LORazepam (ATIVAN) 1 MG tablet Take 1 mg by mouth at bedtime.    [provider]  Polyethyl Glycol-Propyl Glycol (SYSTANE OP) Apply to eye as needed.    [provider]  Polyethylene Glycol 3350 (MIRALAX PO) Take 1 Dose by mouth as needed (constipation).  03/30/19   [provider]  Wheat Dextrin (BENEFIBER DRINK MIX PO) Take 1 Dose by mouth as needed (constipation).     [provider]    Family History Family History  Problem Relation Age of Onset   Heart failure  Mother    Leukemia Mother    Heart failure Father     Social History Social History   Tobacco Use   Smoking status: Former Smoker    Packs/day: 1.00    Years: 20.00    Pack years: 20.00    Types: Cigarettes    Quit date: 1989    Years since quitting: 31.8   Smokeless tobacco: Never Used  Substance Use Topics   Alcohol use: No   Drug use: No     Allergies   Omeprazole, Flagyl [metronidazole], Amiodarone, Aripiprazole, Hylan g-f 20, Paroxetine hcl, Statins, Sulfa antibiotics, Toprol xl [metoprolol succinate], Vancomycin, and Penicillins   Review of Systems Review of Systems  All other systems reviewed and are negative.    Physical Exam Updated Vital Signs BP 112/72 (BP Location: Right Arm)    Pulse 74    Temp 98.2 F (36.8 C) (Oral)    Resp 18    SpO2 100%   Physical Exam Vitals signs and nursing note reviewed.  Constitutional:      General: She is not in acute distress.    Appearance: She is well-developed.  HENT:     Head: Normocephalic and atraumatic.     Mouth/Throat:     Pharynx: No oropharyngeal exudate.  Eyes:     General: No scleral icterus.       Right eye: No discharge.        Left eye: No discharge.     Conjunctiva/sclera: Conjunctivae normal.     Pupils: Pupils are equal, round, and reactive to light.  Neck:     Musculoskeletal: Normal range of motion and neck supple.     Thyroid: No thyromegaly.     Vascular: No JVD.  Cardiovascular:      Rate and Rhythm: Normal rate and regular rhythm.     Heart sounds: Normal heart sounds. No murmur. No friction rub. No gallop.   Pulmonary:     Effort: Pulmonary effort is normal. No respiratory distress.     Breath sounds: Normal breath sounds. No wheezing or rales.  Abdominal:     General: There is no distension.     Palpations: Abdomen is soft. There is no mass.     Tenderness: There is abdominal tenderness ( Mild left lower quadrant tenderness without guarding).     Comments: Increased bowel sounds  Musculoskeletal: Normal range of motion.        General: No tenderness.  Lymphadenopathy:     Cervical: No cervical adenopathy.  Skin:    General: Skin is warm and dry.     Findings: No erythema or rash.  Neurological:     Mental Status: She is alert.     Coordination: Coordination normal.  Psychiatric:        Behavior: Behavior normal.      ED Treatments / Results  Labs (all labs ordered are listed, but only abnormal results are displayed) Labs Reviewed  CBC WITH DIFFERENTIAL/PLATELET - Abnormal; Notable for the following components:      Result Value   MCV 102.7 (*)    All other components within normal limits  COMPREHENSIVE METABOLIC PANEL - Abnormal; Notable for the following components:   Glucose, Bld 111 (*)    GFR calc non Af Amer 54 (*)    All other components within normal limits  URINALYSIS, ROUTINE W REFLEX MICROSCOPIC - Abnormal; Notable for the following components:   Specific Gravity, Urine >1.046 (*)    Hgb urine dipstick SMALL (*)  Ketones, ur 5 (*)    All other components within normal limits  URINE CULTURE    EKG None  Radiology Ct Abdomen Pelvis W Contrast  Result Date: 08/20/2019 CLINICAL DATA:  Left-sided abdominal pain with difficulty breathing. Duration of 3 days. EXAM: CT ABDOMEN AND PELVIS WITH CONTRAST TECHNIQUE: Multidetector CT imaging of the abdomen and pelvis was performed using the standard protocol following bolus administration  of intravenous contrast. CONTRAST:  176mL OMNIPAQUE IOHEXOL 300 MG/ML  SOLN COMPARISON:  CT abdomen pelvis 03/28/2019 FINDINGS: LOWER CHEST: Advanced cardiomegaly. No pleural effusion. HEPATOBILIARY: Normal hepatic contours. There is no intra- or extrahepatic biliary dilatation. The gallbladder is normal. PANCREAS: Normal pancreatic contours without pancreatic ductal dilatation or peripancreatic fluid collection. SPLEEN: Normal. ADRENALS/URINARY TRACT: --Adrenal glands: Normal. --Right kidney/ureter: No hydronephrosis, nephroureterolithiasis or solid renal mass. --Left kidney/ureter: No hydronephrosis, nephroureterolithiasis or solid renal mass. --Urinary bladder: Normal for degree of distention STOMACH/BOWEL: --Stomach/Duodenum: There is no hiatal hernia. The duodenal course and caliber are normal. --Small bowel: No dilatation or inflammation. --Colon: Rectosigmoid diverticulosis without acute inflammation. --Appendix: Not visualized. No right lower quadrant inflammation or free fluid. VASCULAR/LYMPHATIC: There is calcific atherosclerosis of the abdominal aorta. No abdominal or pelvic lymphadenopathy. REPRODUCTIVE: Normal uterus and ovaries. MUSCULOSKELETAL. No bony spinal canal stenosis or focal osseous abnormality. OTHER: None. IMPRESSION: 1. No acute abdominal or pelvic abnormality. 2. Advanced cardiomegaly with Aortic atherosclerosis (ICD10-I70.0). 3. Diverticulosis without acute inflammation. Electronically Signed   By: Ulyses Jarred M.D.   On: 08/20/2019 20:11    Procedures Procedures (including critical care time)  Medications Ordered in ED Medications  sodium chloride 0.9 % bolus 500 mL (has no administration in time range)  iohexol (OMNIPAQUE) 300 MG/ML solution 100 mL (100 mLs Intravenous Contrast Given 08/20/19 1951)     Initial Impression / Assessment and Plan / ED Course  I have reviewed the triage vital signs and the nursing notes.  Pertinent labs & imaging results that were available  during my care of the patient were reviewed by me and considered in my medical decision making (see chart for details).  Clinical Course as of Aug 19 2116  Mon Aug 20, 2019  2035 CT scan reveals diverticulosis, no inflammation, the labs are totally unremarkable thankfully.  Vital signs are also unremarkable, the patient will be given reassurance that this is not acute diverticulitis or other acute surgical abdomen.  Stable for discharge   [BM]  2116 Urinalysis shows some high specific gravity but no signs of infection.  She does have 5 ketones.  IV fluids given prior to discharge   [BM]    Clinical Course User Index [BM] Noemi Chapel, MD       The patient has an unremarkable abdominal exam though she has mild tenderness she does not have any peritoneal signs or guarding.  She has increased bowel sounds which is consistent with having some increased diarrhea.  She is concerned about diverticulitis which is reasonable, she is afebrile, not tachycardic and otherwise well-appearing, she is severely anxious ridden about having a possible recurrent illness.  She will have labs and a CT scan to confirm, the patient is otherwise agreeable and stable.  There is no room in the back that she is in the waiting room but the beginning of the evaluation will start despite this.  She will be moved back to soon as a bed becomes available.  Her vital signs are stable  UA and culture pending No leukocytosis Normal renal and liver function.  Discussed results with pt and spouse. Stable for d/c.  Final Clinical Impressions(s) / ED Diagnoses   Final diagnoses:  Left lower quadrant abdominal pain    ED Discharge Orders    None       Noemi Chapel, MD 08/20/19 2117

## 2019-08-21 NOTE — Telephone Encounter (Signed)
PT went to the ED.

## 2019-08-22 LAB — URINE CULTURE: Culture: NO GROWTH

## 2019-08-23 DIAGNOSIS — D225 Melanocytic nevi of trunk: Secondary | ICD-10-CM | POA: Diagnosis not present

## 2019-08-23 DIAGNOSIS — Z1283 Encounter for screening for malignant neoplasm of skin: Secondary | ICD-10-CM | POA: Diagnosis not present

## 2019-08-27 DIAGNOSIS — R109 Unspecified abdominal pain: Secondary | ICD-10-CM | POA: Diagnosis not present

## 2019-08-27 DIAGNOSIS — Z712 Person consulting for explanation of examination or test findings: Secondary | ICD-10-CM | POA: Diagnosis not present

## 2019-08-27 NOTE — Telephone Encounter (Signed)
CT performed in ED. No acute findings. Recommend office visit.

## 2019-08-28 NOTE — Telephone Encounter (Signed)
FYI to Anna. 

## 2019-08-28 NOTE — Telephone Encounter (Signed)
Patient is already scheduled for appointment with Vicente Males

## 2019-08-28 NOTE — Telephone Encounter (Signed)
PT is aware and Ok to schedule appt. Forwarding to [place] to schedule.

## 2019-09-04 NOTE — Progress Notes (Signed)
Remote pacemaker transmission.   

## 2019-09-13 DIAGNOSIS — I5020 Unspecified systolic (congestive) heart failure: Secondary | ICD-10-CM | POA: Diagnosis not present

## 2019-09-13 DIAGNOSIS — R0902 Hypoxemia: Secondary | ICD-10-CM | POA: Diagnosis not present

## 2019-09-13 DIAGNOSIS — R0600 Dyspnea, unspecified: Secondary | ICD-10-CM | POA: Diagnosis not present

## 2019-09-13 DIAGNOSIS — R0602 Shortness of breath: Secondary | ICD-10-CM | POA: Diagnosis not present

## 2019-09-15 DIAGNOSIS — S46811A Strain of other muscles, fascia and tendons at shoulder and upper arm level, right arm, initial encounter: Secondary | ICD-10-CM | POA: Diagnosis not present

## 2019-09-15 DIAGNOSIS — E441 Mild protein-calorie malnutrition: Secondary | ICD-10-CM | POA: Diagnosis not present

## 2019-09-15 DIAGNOSIS — I4891 Unspecified atrial fibrillation: Secondary | ICD-10-CM | POA: Diagnosis not present

## 2019-09-15 DIAGNOSIS — Z712 Person consulting for explanation of examination or test findings: Secondary | ICD-10-CM | POA: Diagnosis not present

## 2019-10-02 DIAGNOSIS — M79672 Pain in left foot: Secondary | ICD-10-CM | POA: Diagnosis not present

## 2019-10-02 DIAGNOSIS — M79671 Pain in right foot: Secondary | ICD-10-CM | POA: Diagnosis not present

## 2019-10-02 DIAGNOSIS — I739 Peripheral vascular disease, unspecified: Secondary | ICD-10-CM | POA: Diagnosis not present

## 2019-10-02 DIAGNOSIS — L110 Acquired keratosis follicularis: Secondary | ICD-10-CM | POA: Diagnosis not present

## 2019-10-09 ENCOUNTER — Other Ambulatory Visit: Payer: Self-pay

## 2019-10-09 ENCOUNTER — Encounter: Payer: Self-pay | Admitting: Gastroenterology

## 2019-10-09 ENCOUNTER — Ambulatory Visit: Payer: Medicare Other | Admitting: Gastroenterology

## 2019-10-09 VITALS — BP 111/74 | HR 70 | Temp 96.8°F | Ht 64.0 in | Wt 127.4 lb

## 2019-10-09 DIAGNOSIS — K5900 Constipation, unspecified: Secondary | ICD-10-CM

## 2019-10-09 DIAGNOSIS — R1310 Dysphagia, unspecified: Secondary | ICD-10-CM

## 2019-10-09 NOTE — Patient Instructions (Signed)
I recommend increasing your protein shakes to at least 1.5 to 2 per day. Continue to eat the 3 meals, and add these in as "snacks".  Let's go ahead and resume the clear colace, once to twice a day. Please call if this isn't helpful!  We will see you in 6 weeks!  I enjoyed seeing you again today! As you know, I value our relationship and want to provide genuine, compassionate, and quality care. I welcome your feedback. If you receive a survey regarding your visit,  I greatly appreciate you taking time to fill this out. See you next time!  Annitta Needs, PhD, ANP-BC [facility]

## 2019-10-09 NOTE — Progress Notes (Signed)
Referring Provider: Celene Squibb, MD Primary Care Physician:  Celene Squibb, MD  Primary GI: Dr. Oneida Alar   Chief Complaint  Patient presents with  . Constipation    HPI:   Carol Hardin is an 85 y.o. female presenting today with a history of chronic constipation, GERD, dysphagia, wanting to avoid prescriptive agents for constipation currently. Last seen in Sept 2020. Presented to ED in interim from last appointment with abdominal pain. CT abd/pelvis with contrast without acute findings.    Taking Metamucil 1.5 teaspoons daily. LLQ gurgling at times. BM QOD to daily when taking clear colace but not taking now. Unable to take the red kind with dye. No straining. Appetite is good but eating less as worried about symptoms that she had with Amiodarone earlier this year. Not on amiodarone.  Ensure once per day. Some solid food dysphagia with breads. Known age-appropriate esophageal dysmotility. Has wanted to avoid endoscopic measures unless necessary.   Weight June 2020: 135 Sept 2020: 132 Today 127  Past Medical History:  Diagnosis Date  . Anxiety   . Arthritis    "fingers, right toe" (08/09/2016)  . Basal cell carcinoma of lower leg, right   . Bradycardia    a. Holter 08/30/16 showed profound bradycardia down to 30 during awake hours, several 2 second pauses, very frequent PVCs >8000 in 48 hours, and NSVT (longest of 14 beats).  . Cardiomyopathy ([place])    a. EF 40% by echo 08/2016.  . Daily headache    "I usually wake up w/a headache; sometimes it's a migraine" (08/09/2016)  . Depression   . Diverticulosis    Hx. of  . Frequent PVCs   . GERD (gastroesophageal reflux disease)   . Hypercholesterolemia   . Migraine   . Mitral regurgitation   . MVP (mitral valve prolapse)   . NSVT (nonsustained ventricular tachycardia) ([place])    a. first noted event monitor 08/2016.  . Osteoporosis   . PAF (paroxysmal atrial fibrillation) ([place])    a. in afib at time of echo 08/2016.  Marland Kitchen  Pneumonia   . Scoliosis    mild  . Squamous cell carcinoma of neck     Past Surgical History:  Procedure Laterality Date  . AV NODE ABLATION N/A 01/23/2019   Procedure: AV NODE ABLATION;  Surgeon: Thompson Grayer, MD;  Location: [place] [facility];  Service: Cardiovascular;  Laterality: N/A;  . BASAL CELL CARCINOMA EXCISION Right    RLE  . BIV PACEMAKER INSERTION CRT-P N/A 01/23/2019   Procedure: BIV PACEMAKER INSERTION CRT-P;  Surgeon: Thompson Grayer, MD;  Location: [place] [facility];  Service: Cardiovascular;  Laterality: N/A;  . BLEPHAROPLASTY    . BREAST BIOPSY Left   . BREAST CYST ASPIRATION Left   . BREAST CYST EXCISION Left   . BUNIONECTOMY Bilateral   . DILATION AND CURETTAGE OF UTERUS    . EXCISIONAL HEMORRHOIDECTOMY    . INTRAMEDULLARY (IM) NAIL INTERTROCHANTERIC Left 12/31/2017   Procedure: INTRAMEDULLARY (IM) NAIL INTERTROCHANTRIC;  Surgeon: Nicholes Stairs, MD;  Location: [place];  Service: Orthopedics;  Laterality: Left;  . KNEE ARTHROSCOPY Right 04/12/2007  . KNEE ARTHROSCOPY Left   . SQUAMOUS CELL CARCINOMA EXCISION     "neck"  . TONSILLECTOMY      Current Outpatient Medications  Medication Sig Dispense Refill  . acetaminophen (TYLENOL) 325 MG tablet Take 650 mg by mouth every 6 (six) hours as needed.    . Calcium Carbonate Antacid (TUMS PO)  Take by mouth as needed.    . docusate sodium (COLACE) 100 MG capsule Take 100 mg by mouth daily as needed for mild constipation.    Marland Kitchen ELIQUIS 5 MG TABS tablet TAKE 1 TABLET(5 MG) BY MOUTH TWICE DAILY 60 tablet 6  . Emollient ([place] AG HAND & BODY) LOTN Apply 1 application topically daily as needed.    . Ensure (ENSURE) Take 237 mLs by mouth daily.     . furosemide (LASIX) 20 MG tablet Take 20 mg by mouth as needed for fluid.    Marland Kitchen LORazepam (ATIVAN) 1 MG tablet Take 1 mg by mouth at bedtime.    [name] Glycol-Propyl Glycol (SYSTANE OP) Apply to eye as needed.    . Polyethylene Glycol 3350 (MIRALAX PO) Take 1 Dose by  mouth as needed (constipation).     . Probiotic Product (ALIGN PO) Take by mouth daily.    . Psyllium (METAMUCIL FIBER PO) Take by mouth daily. 1 1/2 teaspoons daily    . Wheat Dextrin (BENEFIBER DRINK MIX PO) Take 1 Dose by mouth as needed (constipation).      No current facility-administered medications for this visit.    Allergies as of 10/09/2019 - Review Complete 10/09/2019  Allergen Reaction Noted  . Omeprazole Other (See Comments) 09/01/2016  . Flagyl [metronidazole] Nausea And Vomiting 08/19/2016  . Amiodarone  03/30/2019  . Aripiprazole Other (See Comments) 10/09/2013  . Hylan g-f 20 Other (See Comments) 10/09/2013  . Paroxetine hcl Other (See Comments) 04/23/2011  . Statins Other (See Comments) 10/18/2018  . Sulfa antibiotics Diarrhea and Other (See Comments) 05/19/2018  . Toprol xl [metoprolol succinate] Other (See Comments) 04/23/2011  . Vancomycin Itching and Other (See Comments) 04/23/2011  . Penicillins Rash     Family History  Problem Relation Age of Onset  . Heart failure Mother   . Leukemia Mother   . Heart failure Father     Social History   Socioeconomic History  . Marital status: Married    Spouse name: Not on file  . Number of children: Not on file  . Years of education: Not on file  . Highest education level: Not on file  Occupational History  . Not on file  Tobacco Use  . Smoking status: Former Smoker    Packs/day: 1.00    Years: 20.00    Pack years: 20.00    Types: Cigarettes    Quit date: 1989    Years since quitting: 32.0  . Smokeless tobacco: Never Used  Substance and Sexual Activity  . Alcohol use: No  . Drug use: No  . Sexual activity: Not Currently  Other Topics Concern  . Not on file  Social History Narrative  . Not on file   Social Determinants of Health   Financial Resource Strain:   . Difficulty of Paying Living Expenses: Not on file  Food Insecurity:   . Worried About Charity fundraiser in the Last Year: Not on file    . Ran Out of Food in the Last Year: Not on file  Transportation Needs:   . Lack of Transportation (Medical): Not on file  . Lack of Transportation (Non-Medical): Not on file  Physical Activity:   . Days of Exercise per Week: Not on file  . Minutes of Exercise per Session: Not on file  Stress:   . Feeling of Stress : Not on file  Social Connections:   . Frequency of Communication with Friends and Family: Not on file  .  Frequency of Social Gatherings with Friends and Family: Not on file  . Attends Religious Services: Not on file  . Active Member of Clubs or Organizations: Not on file  . Attends Archivist Meetings: Not on file  . Marital Status: Not on file    Review of Systems: Gen: see HPI CV: Denies chest pain, palpitations, syncope, peripheral edema, and claudication. Resp: Denies dyspnea at rest, cough, wheezing, coughing up blood, and pleurisy. GI: see HPI Derm: Denies rash, itching, dry skin Psych: Denies depression, anxiety, memory loss, confusion. No homicidal or suicidal ideation.  Heme: Denies bruising, bleeding, and enlarged lymph nodes.  Physical Exam: BP 111/74   Pulse 70   Temp (!) 96.8 F (36 C) (Temporal)   Ht 5\' 4"  (1.626 m)   Wt 127 lb 6.4 oz (57.8 kg)   BMI 21.87 kg/m  General:   Alert and oriented. No distress noted. Pleasant and cooperative.  Head:  Normocephalic and atraumatic. Abdomen:  +BS, soft, non-tender and non-distended. No rebound or guarding. No HSM or masses noted. Msk:  With kyphosis  Extremities:  Without edema. Neurologic:  Alert and  oriented x4 Psych:  Alert and cooperative. Normal mood and affect.  ASSESSMENT: Carol Hardin is a 85 y.o. female presenting today with history of chronic constipation, vague solid food dysphagia in setting of esophageal dysmotility, and now with weight loss.   Constipation: resume clear colace, which she has stopped for unknown reasons. Continue fiber daily.  Dysphagia: with breads.  Wanting to avoid EGD. Likely would not be helpful in setting of known dysmotility,  Weight loss: from last visit has lost 5 lbs. Today 127 and high of 137 June 2020. Notes decreased intake due to fear of experiencing symptoms she attributes to amiodarone, which has been stopped months ago. No postprandial abdominal pain. I have asked that she increase boost/ensure to twice a day as snacks, and call if she has any postprandial pain. Otherwise, she was encouraged to increase intake and reminded she has not had those symptoms since discontinuation of amiodarone.    PLAN:  Add colace to regimen, as this has helped historically Increase ensure for snacks Encouraged meals, portion sizes Return in 6 weeks for close follow-up   Annitta Needs, PhD, ANP-BC [facility]

## 2019-10-10 ENCOUNTER — Encounter: Payer: Self-pay | Admitting: Gastroenterology

## 2019-10-17 ENCOUNTER — Other Ambulatory Visit: Payer: Self-pay

## 2019-10-17 ENCOUNTER — Ambulatory Visit: Payer: Medicare Other | Admitting: Allergy & Immunology

## 2019-10-17 ENCOUNTER — Encounter: Payer: Self-pay | Admitting: Allergy & Immunology

## 2019-10-17 VITALS — BP 108/62 | HR 64 | Temp 98.2°F | Resp 18 | Ht 64.0 in | Wt 129.4 lb

## 2019-10-17 DIAGNOSIS — J310 Chronic rhinitis: Secondary | ICD-10-CM

## 2019-10-17 DIAGNOSIS — R0602 Shortness of breath: Secondary | ICD-10-CM

## 2019-10-17 MED ORDER — IPRATROPIUM BROMIDE 0.06 % NA SOLN: 1.0000 | Freq: Three times a day (TID) | NASAL | 5 refills | Status: DC

## 2019-10-17 NOTE — Progress Notes (Signed)
NEW PATIENT  Date of Service/Encounter:  10/17/19  Referring provider: Celene Squibb, MD   Assessment:   SOB (shortness of breath)   Chronic rhinitis   Possible adverse drug effect (amiodarone)     Carol Hardin presents for an evaluation of shortness of breath, but it seems that she also has weight loss and food regurgitation. Amiodarone has a number of adverse effects, but I have no experience with any adverse effects to amiodarone.  I think her management would be better served by pulmonology, who has much more experience with dealing with the side effects of this drug.  They would also know more about the work-up on how to diagnose an adverse effect of amiodarone.  She is wondering about getting a lawyer, but at this point I do not know what kind of guidance to provide her for this request.  Plan/Recommendations:   1. SOB (shortness of breath) - possibly secondary to amiodarone - We are going to refer you to Pulmonology, who has more experience with dealing with amiodarone toxicity.  - I have never prescribed amiodarone before and I have no experience with it. - So I think your needs are best served by seeing a Pulmonologist.   2. Chronic rhinitis - We are going to get some environmental allergy testing via the blood. - We are ordering labs, so please allow 1-2 weeks for the results to come back. - With the newly implemented Cures Act, the labs might be visible to you at the same time that they become visible to me. - However, I will not address the results until all of the results come  back, so please be patient.  - In the meantime, start taking nasal ipratropium one spray per nostril three times daily to help with postnasal drip.  3. Return in about 6 months (around 04/15/2020). This can be an in-person, a virtual Webex or a telephone follow up visit.    Subjective:   Carol Hardin is a 86 y.o. female presenting today for evaluation of  Chief Complaint  Patient  presents with  . Allergies    Unsure of cause  . Nasal Congestion    Chest, Sinus x1 year     Carol Hardin has a history of the following: Patient Active Problem List   Diagnosis Date Noted  . Dysphagia 03/30/2019  . Constipation 03/30/2019  . Pacemaker   . Chronic respiratory failure with hypoxia ([place])   . On home O2   . Sick sinus syndrome ([place]) 01/22/2019  . CKD (chronic kidney disease) stage 3, GFR 30-59 ml/min 01/21/2019  . Atrial fibrillation with RVR ([place]) 01/20/2019  . Acute on chronic systolic CHF (congestive heart failure) ([place]) 12/25/2018  . Tachy-brady syndrome ([place]) 12/25/2018  . Atrial fibrillation, chronic ([place]) 12/24/2018  . Congestive heart failure with left ventricular diastolic dysfunction, unspecified failure chronicity ([place]) 12/24/2018  . Malnutrition of moderate degree 11/20/2018  . Atrial fibrillation with rapid ventricular response ([place]) 11/18/2018  . GERD (gastroesophageal reflux disease) 11/18/2018  . CAP (community acquired pneumonia) 11/18/2018  . Acute on chronic diastolic HF (heart failure) ([place]) 11/18/2018  . Stroke (cerebrum) ([place]) 05/19/2018  . Hip fracture ([place]) 12/31/2017  . PAF (paroxysmal atrial fibrillation) ([place])   . NSVT (nonsustained ventricular tachycardia) ([place])   . Mitral regurgitation   . Frequent PVCs   . Bradycardia   . Essential hypertension   . Hypokalemia   . Diverticulitis 08/09/2016  . Diverticulosis of colon 04/22/2016  . Diverticulosis  of large intestine without perforation or abscess without bleeding 04/22/2016  . Generalized anxiety disorder 04/22/2016  . Irritable bowel syndrome without diarrhea 04/22/2016  . Mitral valve prolapse 04/22/2016  . Mixed hyperlipidemia 04/22/2016  . Osteopenia 04/22/2016  . Low blood pressure 09/24/2014  . Hypercholesterolemia   . History of depression   . Scoliosis   . Osteoporosis   . DYSPNEA 05/31/2008    History obtained from: chart review and patient.  Carol Hardin was referred by Celene Squibb, MD.     Carol Hardin is a 86 y.o. female presenting for an evaluation of a multitude of complaints, but they all seem to be centered on her perceived adverse reaction to amiodarone.  Her referral was made for wheezing, but this is the last thing she wants to talk about today.  She tells me that she is followed by Dr. Harl Bowie and Dr. Rayann Heman in cardiology.  She was diagnosed with atrial fibrillation after contracting pneumonia in early 2020.  She was on 2 antibiotics.  After this, she got the diagnosis of atrial fibrillation.  It is unclear what else she was put on, at least per the patient report, but eventually she was placed on amiodarone.  When she was on amiodarone, she reports that she developed reflux and having problems with diarrhea and weight loss.  Dr. Harl Bowie reduced the dosage, but nothing got better at all. She reports that she developed some breathlessness after the amiodarone. She is blaming this medication for her weight loss and the breathing problems. She stopped it around April 1st, 2020. She did have O2 ordered on April 24th.  She never had wheezing prior to this.  She has not seen a pulmonologist but is under the impression that I am going to be managing her lung injury.  She also has some constipation and is seeing [facility]. She does report some diarrhea with the amiodarone and has developed hemorrhoids with this medication. She has a newspaper article with her today to  She has bugs in her home and has an exterminator coming out. They stick to her skin. She does report some allergic rhinitis symptoms. She knows that she is allergic to dust and ragweed. Testing was done years ago. She was on allergy shots for a period of time, although the details are hazy.  She is currently using a "green container of the pot". This is over the counter.   Otherwise, there is no history of other atopic diseases, including food allergies, drug  allergies, stinging insect allergies, eczema, urticaria or contact dermatitis. There is no significant infectious history. Vaccinations are up to date.    Past Medical History: Patient Active Problem List   Diagnosis Date Noted  . Dysphagia 03/30/2019  . Constipation 03/30/2019  . Pacemaker   . Chronic respiratory failure with hypoxia ([place])   . On home O2   . Sick sinus syndrome ([place]) 01/22/2019  . CKD (chronic kidney disease) stage 3, GFR 30-59 ml/min 01/21/2019  . Atrial fibrillation with RVR ([place]) 01/20/2019  . Acute on chronic systolic CHF (congestive heart failure) ([place]) 12/25/2018  . Tachy-brady syndrome ([place]) 12/25/2018  . Atrial fibrillation, chronic ([place]) 12/24/2018  . Congestive heart failure with left ventricular diastolic dysfunction, unspecified failure chronicity ([place]) 12/24/2018  . Malnutrition of moderate degree 11/20/2018  . Atrial fibrillation with rapid ventricular response ([place]) 11/18/2018  . GERD (gastroesophageal reflux disease) 11/18/2018  . CAP (community acquired pneumonia) 11/18/2018  . Acute on chronic diastolic HF (heart failure) (  [place]) 11/18/2018  . Stroke (cerebrum) ([place]) 05/19/2018  . Hip fracture ([place]) 12/31/2017  . PAF (paroxysmal atrial fibrillation) ([place])   . NSVT (nonsustained ventricular tachycardia) ([place])   . Mitral regurgitation   . Frequent PVCs   . Bradycardia   . Essential hypertension   . Hypokalemia   . Diverticulitis 08/09/2016  . Diverticulosis of colon 04/22/2016  . Diverticulosis of large intestine without perforation or abscess without bleeding 04/22/2016  . Generalized anxiety disorder 04/22/2016  . Irritable bowel syndrome without diarrhea 04/22/2016  . Mitral valve prolapse 04/22/2016  . Mixed hyperlipidemia 04/22/2016  . Osteopenia 04/22/2016  . Low blood pressure 09/24/2014  . Hypercholesterolemia   . History of depression   . Scoliosis   . Osteoporosis   . DYSPNEA 05/31/2008    Medication List:  Allergies as of  10/17/2019      Reactions   Omeprazole Other (See Comments)   siezure   Flagyl [metronidazole] Nausea And Vomiting   Amiodarone    Aripiprazole Other (See Comments)   Unknown reaction   Hylan G-f 20 Other (See Comments)   Unknown reaction   Paroxetine Hcl Other (See Comments)   Headaches (a long time ago- pt doesn't really remember)   Statins Other (See Comments)   No specific reaction given-patient states that she was advised not to take by physician   Sulfa Antibiotics Diarrhea, Other (See Comments)   Colitis   Toprol Xl [metoprolol Succinate] Other (See Comments)   Dizziness--pt doesn't really remember    Vancomycin Itching, Other (See Comments)   Pt reports that they gave it too fast- she started having itching in the scalp   Penicillins Rash   DID THE REACTION INVOLVE: Swelling of the face/tongue/throat, SOB, or low BP? No Sudden or severe rash/hives, skin peeling, or the inside of the mouth or nose? Yes Did it require medical treatment? Unknown When did it last happen?Over 10 years If all above answers are "NO", may proceed with cephalosporin use.      Medication List       Accurate as of October 17, 2019  1:28 PM. If you have any questions, ask your nurse or doctor.        acetaminophen 325 MG tablet Commonly known as: TYLENOL Take 650 mg by mouth every 6 (six) hours as needed.   ALIGN PO Take by mouth daily.   BENEFIBER DRINK MIX PO Take 1 Dose by mouth as needed (constipation).   docusate sodium 100 MG capsule Commonly known as: COLACE Take 100 mg by mouth daily as needed for mild constipation.   Eliquis 5 MG Tabs tablet Generic drug: apixaban TAKE 1 TABLET(5 MG) BY MOUTH TWICE DAILY   Ensure Take 237 mLs by mouth daily.   furosemide 20 MG tablet Commonly known as: LASIX Take 20 mg by mouth as needed for fluid.   LORazepam 1 MG tablet Commonly known as: ATIVAN Take 1 mg by mouth at bedtime.   Mederma AG Hand & Body Lotn Apply 1 application  topically daily as needed.   METAMUCIL FIBER PO Take by mouth daily. 1 1/2 teaspoons daily   MIRALAX PO Take 1 Dose by mouth as needed (constipation).   SYSTANE OP Apply to eye as needed.   TUMS PO Take by mouth as needed.       Birth History: non-contributory  Developmental History: non-contributory  Past Surgical History: Past Surgical History:  Procedure Laterality Date  . AV NODE ABLATION N/A 01/23/2019   Procedure: AV  NODE ABLATION;  Surgeon: Thompson Grayer, MD;  Location: [facility];  Service: Cardiovascular;  Laterality: N/A;  . BASAL CELL CARCINOMA EXCISION Right    RLE  . BIV PACEMAKER INSERTION CRT-P N/A 01/23/2019   Procedure: BIV PACEMAKER INSERTION CRT-P;  Surgeon: Thompson Grayer, MD;  Location: [facility];  Service: Cardiovascular;  Laterality: N/A;  . BLEPHAROPLASTY    . BREAST BIOPSY Left   . BREAST CYST ASPIRATION Left   . BREAST CYST EXCISION Left   . BUNIONECTOMY Bilateral   . DILATION AND CURETTAGE OF UTERUS    . EXCISIONAL HEMORRHOIDECTOMY    . INTRAMEDULLARY (IM) NAIL INTERTROCHANTERIC Left 12/31/2017   Procedure: INTRAMEDULLARY (IM) NAIL INTERTROCHANTRIC;  Surgeon: Nicholes Stairs, MD;  Location: [facility];  Service: Orthopedics;  Laterality: Left;  . KNEE ARTHROSCOPY Right 04/12/2007  . KNEE ARTHROSCOPY Left   . SQUAMOUS CELL CARCINOMA EXCISION     "neck"  . TONSILLECTOMY       Family History: Family History  Problem Relation Age of Onset  . Heart failure Mother   . Leukemia Mother   . Heart failure Father   . Allergic rhinitis Neg Hx   . Angioedema Neg Hx   . Asthma Neg Hx   . Eczema Neg Hx   . Atopy Neg Hx   . Immunodeficiency Neg Hx   . Urticaria Neg Hx      Social History: Hampton lives in a house that was built in 1951.  There is wood throughout the home.  She has gas heating and central cooling.  There are no animals inside or outside of the home.  She does not have dust mite covers on her bedding.  There might  be some bugs inside of the home, as highlighted in the History of Present Illness.  She has been retired since 1992.  She quit smoking in 1989.   Review of Systems  Constitutional: Positive for malaise/fatigue and weight loss. Negative for chills and fever.  HENT: Negative.  Negative for congestion, ear discharge, ear pain, sinus pain and sore throat.        Positive for rhinorrhea.  Eyes: Negative for pain, discharge and redness.  Respiratory: Negative for cough, sputum production, shortness of breath and wheezing.   Cardiovascular: Negative.  Negative for chest pain and palpitations.  Gastrointestinal: Positive for abdominal pain, constipation and diarrhea. Negative for heartburn, nausea and vomiting.       Positive for reflux.  Skin: Negative.  Negative for itching and rash.  Neurological: Negative for dizziness and headaches.  Endo/Heme/Allergies: Negative for environmental allergies. Does not bruise/bleed easily.       Objective:   Blood pressure 108/62, pulse 64, temperature 98.2 F (36.8 C), temperature source Temporal, resp. rate 18, height 5\' 4"  (1.626 m), weight 129 lb 6.4 oz (58.7 kg), SpO2 97 %. Body mass index is 22.21 kg/m.   Physical Exam:   Physical Exam  Constitutional: She appears well-developed.  Thin appearing female. Very   HENT:  Head: Normocephalic and atraumatic.  Right Ear: Tympanic membrane, external ear and ear canal normal. No drainage, swelling or tenderness. Tympanic membrane is not injected, not scarred, not erythematous, not retracted and not bulging.  Left Ear: Tympanic membrane, external ear and ear canal normal. No drainage, swelling or tenderness. Tympanic membrane is not injected, not scarred, not erythematous, not retracted and not bulging.  Nose: Mucosal edema and rhinorrhea present. No nasal deformity or septal deviation. No epistaxis. Right sinus exhibits  no maxillary sinus tenderness and no frontal sinus tenderness. Left sinus exhibits no  maxillary sinus tenderness and no frontal sinus tenderness.  Mouth/Throat: Uvula is midline and oropharynx is clear and moist. Mucous membranes are not pale and not dry.  No polyps.  There is no epistaxis noted.  Eyes: Pupils are equal, round, and reactive to light. Conjunctivae and EOM are normal. Right eye exhibits no chemosis and no discharge. Left eye exhibits no chemosis and no discharge. Right conjunctiva is not injected. Left conjunctiva is not injected.  Cardiovascular: Normal rate, regular rhythm and normal heart sounds.  Respiratory: Effort normal and breath sounds normal. No accessory muscle usage. No tachypnea. No respiratory distress. She has no wheezes. She has no rhonchi. She has no rales. She exhibits no tenderness.  Decreased air sounds at the bases.  GI: There is no abdominal tenderness. There is no rebound and no guarding.  Lymphadenopathy:       Head (right side): No submandibular, no tonsillar and no occipital adenopathy present.       Head (left side): No submandibular, no tonsillar and no occipital adenopathy present.    She has no cervical adenopathy.  Neurological: She is alert.  Skin: No abrasion, no petechiae and no rash noted. Rash is not papular, not vesicular and not urticarial. No erythema. No pallor.  Multiple bruises over her entire body. There are no urticarial lesions appreciated.   Psychiatric: She has a normal mood and affect.     Diagnostic studies:    Spirometry: results abnormal (FEV1: 1.35/81%, FVC: 1.46/58%, FEV1/FVC: 92%).    Spirometry consistent with possible restrictive disease.   Allergy Studies: none         Salvatore Marvel, MD [facility]

## 2019-10-17 NOTE — Patient Instructions (Addendum)
1. SOB (shortness of breath) - possibly secondary to amiodarone - We are going to refer you to Pulmonology, who has more experience with dealing with amiodarone toxicity.  - I have never prescribed amiodarone before and I have no experience with it. - So I think your needs are best served by seeing a Pulmonologist.   2. Chronic rhinitis - We are going to get some environmental allergy testing via the blood. - We are ordering labs, so please allow 1-2 weeks for the results to come back. - With the newly implemented Cures Act, the labs might be visible to you at the same time that they become visible to me. - However, I will not address the results until all of the results come  back, so please be patient.  - In the meantime, start taking nasal ipratropium one spray per nostril three times daily to help with postnasal drip.  3. Return in about 6 months (around 04/15/2020). This can be an in-person, a virtual Webex or a telephone follow up visit.   Please inform us of any Emergency Department visits, hospitalizations, or changes in symptoms. Call us before going to the ED for breathing or allergy symptoms since we might be able to fit you in for a sick visit. Feel free to contact us anytime with any questions, problems, or concerns.  It was a pleasure to meet you today! I am so sorry that all of this is happening to you.  Websites that have reliable patient information: 1. American Academy of Asthma, Allergy, and Immunology: www.aaaai.org 2. Food Allergy Research and Education (FARE): foodallergy.org 3. Mothers of Asthmatics: http://www.asthmacommunitynetwork.org 4. American College of Allergy, Asthma, and Immunology: www.acaai.org  "Like" [place] on Facebook and Instagram for our latest updates!        Make sure you are registered to vote! If you have moved or changed any of your contact information, you will need to get this updated before voting!  In some cases, you MAY be able to register  to vote online: [email or web address]

## 2019-10-19 LAB — IGE+ALLERGENS ZONE 2(30)

## 2019-10-19 LAB — ALLERGEN PROFILE, MOLD
Aureobasidi Pullulans IgE: <0.1 kU/L
Candida Albicans IgE: <0.1 kU/L
M009-IgE Fusarium proliferatum: <0.1 kU/L
M014-IgE Epicoccum purpur: <0.1 kU/L
Phoma Betae IgE: <0.1 kU/L
Setomelanomma Rostrat: <0.1 kU/L

## 2019-10-28 ENCOUNTER — Ambulatory Visit: Payer: Medicare Other | Attending: Internal Medicine

## 2019-10-28 DIAGNOSIS — Z23 Encounter for immunization: Secondary | ICD-10-CM | POA: Insufficient documentation

## 2019-10-28 NOTE — Progress Notes (Signed)
   Covid-19 Vaccination Clinic  Name:  Carol Hardin    MRN: [id number] DOB: January 07, 1933  10/28/2019  [name] was observed post Covid-19 immunization for 30 minutes based on pre-vaccination screening without incidence. She was provided with Vaccine Information Sheet and instruction to access the V-Safe system.   [name] was instructed to call 911 with any severe reactions post vaccine: Marland Kitchen Difficulty breathing  . Swelling of your face and throat  . A fast heartbeat  . A bad rash all over your body  . Dizziness and weakness

## 2019-10-29 ENCOUNTER — Institutional Professional Consult (permissible substitution): Payer: Medicare Other | Admitting: Emergency Medicine

## 2019-11-06 ENCOUNTER — Institutional Professional Consult (permissible substitution): Payer: Medicare Other | Admitting: Emergency Medicine

## 2019-11-08 ENCOUNTER — Ambulatory Visit: Payer: Medicare Other | Admitting: Emergency Medicine

## 2019-11-08 ENCOUNTER — Encounter: Payer: Self-pay | Admitting: Emergency Medicine

## 2019-11-08 ENCOUNTER — Other Ambulatory Visit: Payer: Self-pay

## 2019-11-08 ENCOUNTER — Ambulatory Visit: Payer: Medicare Other | Admitting: Internal Medicine

## 2019-11-08 VITALS — BP 120/70 | HR 64 | Temp 97.8°F | Ht 64.0 in | Wt 127.0 lb

## 2019-11-08 DIAGNOSIS — R0600 Dyspnea, unspecified: Secondary | ICD-10-CM

## 2019-11-08 DIAGNOSIS — J849 Interstitial pulmonary disease, unspecified: Secondary | ICD-10-CM

## 2019-11-08 DIAGNOSIS — T462X1A Poisoning by other antidysrhythmic drugs, accidental (unintentional), initial encounter: Secondary | ICD-10-CM

## 2019-11-08 DIAGNOSIS — R0609 Other forms of dyspnea: Secondary | ICD-10-CM

## 2019-11-08 NOTE — Progress Notes (Signed)
Subjective:    Patient ID: Carol Hardin, female    DOB: 1933-05-18, 86 y.o.   MRN: [id number]  HPI 86 year old woman with history of former tobacco (20 pack years), atrial fibrillation and tachy-brady syndrome and AV nodal ablation plus pacer, chronic systolic CHF (EF 38-46%), MVP with mitral regurgitation, GERD.  She is referred today to discuss possible amiodarone toxicity.  She was started on amiodarone in March while admitted to [facility], continued about a month. She describes quick development of GI sx > nausea, constipation, GERD / emesis. She improved when the amiodarone was stopped, but she still intermittently has symptoms and ascribes them to the drug. She states that she has a good appetite but he cannot gain her weight back  She was experiencing shortness of breath beginning of 2020 and into March, she still has dyspnea, feels a tightness or restriction on the size of her breath, seems to happen both at rest and w exertion. Her exertional tolerance is decreased by breathing, associated anxiety, also possibly some back pain.  She denies cough, wheeze.   She has seen gastroenterology on 12/29 for dysphagia (especially bread) and constipation, poor p.o. intake.  She was concerned that these symptoms did coincide with initiation of amiodarone.  No invasive interventions were planned  She was just seen in Allergy clinic, has had chronic rhinitis. Allergen serologies 10/17/19 all negative.   Spirometry performed on 10/18/2019 reviewed by me shows a poor trial, uninterpretable due to low expiratory time (less than 2 seconds)  Chest x-ray 01/24/2019 post pacemaker placement showed stable cardiomegaly some left basilar atelectasis without any other significant infiltrates. CT scan of the abdomen 08/20/2019 reviewed by me shows some possible regional air trapping at the bases versus very subtle groundglass, no overt infiltrates   Review of Systems    Past Medical History:  Diagnosis Date  .  Anxiety   . Arthritis    "fingers, right toe" (08/09/2016)  . Basal cell carcinoma of lower leg, right   . Bradycardia    a. Holter 08/30/16 showed profound bradycardia down to 30 during awake hours, several 2 second pauses, very frequent PVCs >8000 in 48 hours, and NSVT (longest of 14 beats).  . Cardiomyopathy ([place])    a. EF 40% by echo 08/2016.  . Daily headache    "I usually wake up w/a headache; sometimes it's a migraine" (08/09/2016)  . Depression   . Diverticulosis    Hx. of  . Frequent PVCs   . GERD (gastroesophageal reflux disease)   . Hypercholesterolemia   . Migraine   . Mitral regurgitation   . MVP (mitral valve prolapse)   . NSVT (nonsustained ventricular tachycardia) ([place])    a. first noted event monitor 08/2016.  . Osteoporosis   . PAF (paroxysmal atrial fibrillation) ([place])    a. in afib at time of echo 08/2016.  Marland Kitchen Pneumonia   . Scoliosis    mild  . Squamous cell carcinoma of neck      Family History  Problem Relation Age of Onset  . Heart failure Mother   . Leukemia Mother   . Heart failure Father   . Allergic rhinitis Neg Hx   . Angioedema Neg Hx   . Asthma Neg Hx   . Eczema Neg Hx   . Atopy Neg Hx   . Immunodeficiency Neg Hx   . Urticaria Neg Hx      Social History   Socioeconomic History  . Marital status: Married  Spouse name: Not on file  . Number of children: Not on file  . Years of education: Not on file  . Highest education level: Not on file  Occupational History  . Not on file  Tobacco Use  . Smoking status: Former Smoker    Packs/day: 1.00    Years: 20.00    Pack years: 20.00    Types: Cigarettes    Quit date: 1989    Years since quitting: 32.0  . Smokeless tobacco: Never Used  Substance and Sexual Activity  . Alcohol use: No  . Drug use: No  . Sexual activity: Not Currently  Other Topics Concern  . Not on file  Social History Narrative  . Not on file   Social Determinants of Health   Financial Resource Strain:   .  Difficulty of Paying Living Expenses: Not on file  Food Insecurity:   . Worried About Charity fundraiser in the Last Year: Not on file  . Ran Out of Food in the Last Year: Not on file  Transportation Needs:   . Lack of Transportation (Medical): Not on file  . Lack of Transportation (Non-Medical): Not on file  Physical Activity:   . Days of Exercise per Week: Not on file  . Minutes of Exercise per Session: Not on file  Stress:   . Feeling of Stress : Not on file  Social Connections:   . Frequency of Communication with Friends and Family: Not on file  . Frequency of Social Gatherings with Friends and Family: Not on file  . Attends Religious Services: Not on file  . Active Member of Clubs or Organizations: Not on file  . Attends Archivist Meetings: Not on file  . Marital Status: Not on file  Intimate Partner Violence:   . Fear of Current or Ex-Partner: Not on file  . Emotionally Abused: Not on file  . Physically Abused: Not on file  . Sexually Abused: Not on file     Allergies  Allergen Reactions  . Omeprazole Other (See Comments)    siezure  . Flagyl [Metronidazole] Nausea And Vomiting  . Amiodarone   . Aripiprazole Other (See Comments)    Unknown reaction  . Hylan G-F 20 Other (See Comments)    Unknown reaction  . Paroxetine Hcl Other (See Comments)    Headaches (a long time ago- pt doesn't really remember)  . Statins Other (See Comments)    No specific reaction given-patient states that she was advised not to take by physician  . Sulfa Antibiotics Diarrhea and Other (See Comments)    Colitis   . Toprol Xl [Metoprolol Succinate] Other (See Comments)    Dizziness--pt doesn't really remember   . Vancomycin Itching and Other (See Comments)    Pt reports that they gave it too fast- she started having itching in the scalp  . Penicillins Rash    DID THE REACTION INVOLVE: Swelling of the face/tongue/throat, SOB, or low BP? No Sudden or severe rash/hives, skin  peeling, or the inside of the mouth or nose? Yes Did it require medical treatment? Unknown When did it last happen?Over 10 years If all above answers are "NO", may proceed with cephalosporin use.      Outpatient Medications Prior to Visit  Medication Sig Dispense Refill  . acetaminophen (TYLENOL) 325 MG tablet Take 650 mg by mouth every 6 (six) hours as needed.    . Calcium Carbonate Antacid (TUMS PO) Take by mouth as needed.    Marland Kitchen  docusate sodium (COLACE) 100 MG capsule Take 100 mg by mouth daily as needed for mild constipation.    Marland Kitchen ELIQUIS 5 MG TABS tablet TAKE 1 TABLET(5 MG) BY MOUTH TWICE DAILY 60 tablet 6  . Ensure (ENSURE) Take 237 mLs by mouth daily.     . furosemide (LASIX) 20 MG tablet Take 20 mg by mouth as needed for fluid.    Marland Kitchen ipratropium (ATROVENT) 0.06 % nasal spray Place 1 spray into both nostrils 3 (three) times daily. 15 mL 5  . LORazepam (ATIVAN) 1 MG tablet Take 1 mg by mouth at bedtime.    [name] Glycol-Propyl Glycol (SYSTANE OP) Apply to eye as needed.    . Polyethylene Glycol 3350 (MIRALAX PO) Take 1 Dose by mouth as needed (constipation).     . Probiotic Product (ALIGN PO) Take by mouth daily.    . Psyllium (METAMUCIL FIBER PO) Take by mouth daily. 1 1/2 teaspoons daily    . Emollient ([employer] HAND & BODY) LOTN Apply 1 application topically daily as needed.    . Wheat Dextrin (BENEFIBER DRINK MIX PO) Take 1 Dose by mouth as needed (constipation).      No facility-administered medications prior to visit.        Objective:   Physical Exam  Vitals:   11/08/19 1456  BP: 120/70  Pulse: 64  Temp: 97.8 F (36.6 C)  TempSrc: Temporal  SpO2: 95%  Weight: 127 lb (57.6 kg)  Height: 5\' 4"  (1.626 m)    Gen: Pleasant, quite thin, in no distress,  Anxious affect  ENT: No lesions,  mouth clear,  oropharynx clear, no postnasal drip  Neck: No JVD, no stridor  Lungs: No use of accessory muscles, no crackles or wheezing on normal respiration, no  wheeze on forced expiration  Cardiovascular: RRR, heart sounds normal, no murmur or gallops, no peripheral edema  Musculoskeletal: No deformities, no cyanosis or clubbing  Neuro: alert, awake, non focal  Skin: Warm, no lesions or rash      Assessment & Plan:  DOE (dyspnea on exertion) Ms. Perdew has multiple complaints, particularly gastrointestinal that appeared to have waxed and waned, but 1 consistent complaint since the beginning of 2020 is dyspnea.  She places a lot of blame for all of her symptoms on receiving amiodarone first in the hospital and then through the month of March 2020.  She describes the shortness of breath as a restrictive phenomenon, has difficulty taking a deep breath.  It can be problematic at rest and with exertion.  She has tried spirometry 10/18/2019 but this was a poor study due to low expiratory time.  She does have a tobacco history and is at some risk for obstructive lung disease.  Her CT scan of the abdomen did show some possible basilar air trapping that would also be consistent with obstruction.  I did not see any evidence on that study or on her chest x-rays for amiodarone toxicity.  All the same she is quite worried about this.  Her lung exam is normal  We will perform pulmonary function testing to assess for restriction and obstruction. I will perform a high-resolution CT scan of the chest to screen for any evidence of interstitial disease.  This should also give [place] some information regarding possible air trapping, subtle obstruction. I do not think the presentation is consistent with amiodarone pulmonary toxicity, although she may have had some GI side effects from the medication while she was on it.  Her  symptoms are general and could have other explanations.  She also has some significant anxiety regarding her medical care that I think is playing a role here.  Baltazar Apo, MD, PhD 11/08/2019, 3:57 PM [facility] [phone] or  if no answer [phone]

## 2019-11-08 NOTE — Assessment & Plan Note (Signed)
Ms. Welby has multiple complaints, particularly gastrointestinal that appeared to have waxed and waned, but 1 consistent complaint since the beginning of 2020 is dyspnea.  She places a lot of blame for all of her symptoms on receiving amiodarone first in the hospital and then through the month of March 2020.  She describes the shortness of breath as a restrictive phenomenon, has difficulty taking a deep breath.  It can be problematic at rest and with exertion.  She has tried spirometry 10/18/2019 but this was a poor study due to low expiratory time.  She does have a tobacco history and is at some risk for obstructive lung disease.  Her CT scan of the abdomen did show some possible basilar air trapping that would also be consistent with obstruction.  I did not see any evidence on that study or on her chest x-rays for amiodarone toxicity.  All the same she is quite worried about this.  Her lung exam is normal  We will perform pulmonary function testing to assess for restriction and obstruction. I will perform a high-resolution CT scan of the chest to screen for any evidence of interstitial disease.  This should also give [place] some information regarding possible air trapping, subtle obstruction. I do not think the presentation is consistent with amiodarone pulmonary toxicity, although she may have had some GI side effects from the medication while she was on it.  Her symptoms are general and could have other explanations.  She also has some significant anxiety regarding her medical care that I think is playing a role here.

## 2019-11-08 NOTE — Patient Instructions (Signed)
We will perform pulmonary function testing We will perform a CT scan of your chest to evaluate for any evidence of inflammatory change related to amiodarone. Follow-up with Dr. Lamonte Sakai next available with PFT on the same day to review your testing together.

## 2019-11-12 ENCOUNTER — Ambulatory Visit (INDEPENDENT_AMBULATORY_CARE_PROVIDER_SITE_OTHER): Payer: Medicare Other | Admitting: *Deleted

## 2019-11-12 ENCOUNTER — Encounter: Payer: Self-pay | Admitting: Student

## 2019-11-12 ENCOUNTER — Ambulatory Visit (INDEPENDENT_AMBULATORY_CARE_PROVIDER_SITE_OTHER): Payer: Medicare Other | Admitting: Student

## 2019-11-12 ENCOUNTER — Other Ambulatory Visit: Payer: Self-pay

## 2019-11-12 VITALS — BP 94/66 | HR 75 | Ht 64.0 in | Wt 128.0 lb

## 2019-11-12 DIAGNOSIS — I4891 Unspecified atrial fibrillation: Secondary | ICD-10-CM

## 2019-11-12 DIAGNOSIS — I482 Chronic atrial fibrillation, unspecified: Secondary | ICD-10-CM

## 2019-11-12 DIAGNOSIS — Z950 Presence of cardiac pacemaker: Secondary | ICD-10-CM | POA: Diagnosis not present

## 2019-11-12 DIAGNOSIS — I495 Sick sinus syndrome: Secondary | ICD-10-CM

## 2019-11-12 DIAGNOSIS — I5022 Chronic systolic (congestive) heart failure: Secondary | ICD-10-CM | POA: Diagnosis not present

## 2019-11-12 LAB — CUP PACEART REMOTE DEVICE CHECK
Battery Remaining Longevity: 77 mo
Battery Remaining Percentage: 95.5 %
Battery Voltage: 2.99 V
Brady Statistic AP VP Percent: 0 %
Brady Statistic AP VS Percent: 0 %
Brady Statistic AS VP Percent: 0 %
Brady Statistic AS VS Percent: 0 %
Brady Statistic RA Percent Paced: 1 %
Date Time Interrogation Session: 20210201023150
Implantable Lead Implant Date: 20200414
Implantable Lead Implant Date: 20200414
Implantable Lead Implant Date: 20200414
Implantable Lead Location: 753858
Implantable Lead Location: 753859
Implantable Lead Location: 753860
Implantable Pulse Generator Implant Date: 20200414
Lead Channel Impedance Value: 440 Ohm
Lead Channel Impedance Value: 460 Ohm
Lead Channel Impedance Value: 780 Ohm
Lead Channel Pacing Threshold Amplitude: 0.5 V
Lead Channel Pacing Threshold Amplitude: 1.5 V
Lead Channel Pacing Threshold Pulse Width: 0.5 ms
Lead Channel Pacing Threshold Pulse Width: 0.5 ms
Lead Channel Sensing Intrinsic Amplitude: 1.7 mV
Lead Channel Sensing Intrinsic Amplitude: 12 mV
Lead Channel Setting Pacing Amplitude: 2.5 V
Lead Channel Setting Pacing Amplitude: 2.5 V
Lead Channel Setting Pacing Amplitude: 3.5 V
Lead Channel Setting Pacing Pulse Width: 0.5 ms
Lead Channel Setting Pacing Pulse Width: 0.5 ms
Lead Channel Setting Sensing Sensitivity: 3 mV
Pulse Gen Serial Number: 9431558

## 2019-11-12 MED ORDER — APIXABAN 2.5 MG PO TABS: 2.5000 mg | ORAL_TABLET | Freq: Two times a day (BID) | ORAL | Status: DC

## 2019-11-12 NOTE — Progress Notes (Signed)
Electrophysiology Office Note Date: 11/12/2019  ID:  Carol Hardin, DOB [birth year], MRN [id number]  PCP: Celene Squibb, MD Primary Cardiologist: Carlyle Dolly, MD Electrophysiologist: Dr. Rayann Heman   CC: Pacemaker follow-up  Carol Hardin is a 86 y.o. female seen today for Dr. Rayann Heman . She presents today for routine electrophysiology followup.  Since last being seen in our clinic, the patient reports doing about the same. Her biggest complaint is fatigue. She feels like she just isn't the same as she used to be. She is following up with Dr. Lamonte Sakai for continued work up of DOE, and possible amiodarone pulmonary toxicity. She is not very active. Has trouble gaining weight. Denies chest pain or palpitations. No PND or orthopnea.   Device History: St. Jude BiV PPM implanted 01/23/2019 for Persistent AF s/p AV nodal ablation  Past Medical History:  Diagnosis Date  . Anxiety   . Arthritis    "fingers, right toe" (08/09/2016)  . Basal cell carcinoma of lower leg, right   . Bradycardia    a. Holter 08/30/16 showed profound bradycardia down to 30 during awake hours, several 2 second pauses, very frequent PVCs >8000 in 48 hours, and NSVT (longest of 14 beats).  . Cardiomyopathy ([place])    a. EF 40% by echo 08/2016.  . Daily headache    "I usually wake up w/a headache; sometimes it's a migraine" (08/09/2016)  . Depression   . Diverticulosis    Hx. of  . Frequent PVCs   . GERD (gastroesophageal reflux disease)   . Hypercholesterolemia   . Migraine   . Mitral regurgitation   . MVP (mitral valve prolapse)   . NSVT (nonsustained ventricular tachycardia) ([place])    a. first noted event monitor 08/2016.  . Osteoporosis   . PAF (paroxysmal atrial fibrillation) ([place])    a. in afib at time of echo 08/2016.  Marland Kitchen Pneumonia   . Scoliosis    mild  . Squamous cell carcinoma of neck    Past Surgical History:  Procedure Laterality Date  . AV NODE ABLATION N/A 01/23/2019   Procedure: AV NODE  ABLATION;  Surgeon: Thompson Grayer, MD;  Location: [facility];  Service: Cardiovascular;  Laterality: N/A;  . BASAL CELL CARCINOMA EXCISION Right    RLE  . BIV PACEMAKER INSERTION CRT-P N/A 01/23/2019   Procedure: BIV PACEMAKER INSERTION CRT-P;  Surgeon: Thompson Grayer, MD;  Location: [place] [facility];  Service: Cardiovascular;  Laterality: N/A;  . BLEPHAROPLASTY    . BREAST BIOPSY Left   . BREAST CYST ASPIRATION Left   . BREAST CYST EXCISION Left   . BUNIONECTOMY Bilateral   . DILATION AND CURETTAGE OF UTERUS    . EXCISIONAL HEMORRHOIDECTOMY    . INTRAMEDULLARY (IM) NAIL INTERTROCHANTERIC Left 12/31/2017   Procedure: INTRAMEDULLARY (IM) NAIL INTERTROCHANTRIC;  Surgeon: Nicholes Stairs, MD;  Location: [place];  Service: Orthopedics;  Laterality: Left;  . KNEE ARTHROSCOPY Right 04/12/2007  . KNEE ARTHROSCOPY Left   . SQUAMOUS CELL CARCINOMA EXCISION     "neck"  . TONSILLECTOMY      Current Outpatient Medications  Medication Sig Dispense Refill  . acetaminophen (TYLENOL) 325 MG tablet Take 650 mg by mouth every 6 (six) hours as needed.    . Calcium Carbonate Antacid (TUMS PO) Take by mouth as needed.    . docusate sodium (COLACE) 100 MG capsule Take 100 mg by mouth daily as needed for mild constipation.    [name] (Calcium AG  FACE) CREA Apply 1 application topically daily.    . Ensure (ENSURE) Take 237 mLs by mouth daily.     . furosemide (LASIX) 20 MG tablet Take 20 mg by mouth as needed for fluid.    Marland Kitchen ipratropium (ATROVENT) 0.06 % nasal spray Place 1 spray into both nostrils 3 (three) times daily. 15 mL 5  . LORazepam (ATIVAN) 1 MG tablet Take 1 mg by mouth at bedtime.    [name] Glycol-Propyl Glycol (SYSTANE OP) Apply to eye as needed.    . Pramoxine-Ammonium Lactate (AMLACTIN AP EX) Apply 1 application topically daily.    . Probiotic Product (ALIGN PO) Take by mouth daily.    . Psyllium (METAMUCIL FIBER PO) Take by mouth daily. 1 1/2 teaspoons daily    .  apixaban (ELIQUIS) 2.5 MG TABS tablet Take 1 tablet (2.5 mg total) by mouth 2 (two) times daily.     No current facility-administered medications for this visit.    Allergies:   Omeprazole, Flagyl [metronidazole], Amiodarone, Aripiprazole, Hylan g-f 20, Paroxetine hcl, Statins, Sulfa antibiotics, Toprol xl [metoprolol succinate], Vancomycin, and Penicillins   Social History: Social History   Socioeconomic History  . Marital status: Married    Spouse name: Not on file  . Number of children: Not on file  . Years of education: Not on file  . Highest education level: Not on file  Occupational History  . Not on file  Tobacco Use  . Smoking status: Former Smoker    Packs/day: 1.00    Years: 20.00    Pack years: 20.00    Types: Cigarettes    Quit date: 1989    Years since quitting: 32.1  . Smokeless tobacco: Never Used  Substance and Sexual Activity  . Alcohol use: No  . Drug use: No  . Sexual activity: Not Currently  Other Topics Concern  . Not on file  Social History Narrative  . Not on file   Social Determinants of Health   Financial Resource Strain:   . Difficulty of Paying Living Expenses: Not on file  Food Insecurity:   . Worried About Charity fundraiser in the Last Year: Not on file  . Ran Out of Food in the Last Year: Not on file  Transportation Needs:   . Lack of Transportation (Medical): Not on file  . Lack of Transportation (Non-Medical): Not on file  Physical Activity:   . Days of Exercise per Week: Not on file  . Minutes of Exercise per Session: Not on file  Stress:   . Feeling of Stress : Not on file  Social Connections:   . Frequency of Communication with Friends and Family: Not on file  . Frequency of Social Gatherings with Friends and Family: Not on file  . Attends Religious Services: Not on file  . Active Member of Clubs or Organizations: Not on file  . Attends Archivist Meetings: Not on file  . Marital Status: Not on file  Intimate  Partner Violence:   . Fear of Current or Ex-Partner: Not on file  . Emotionally Abused: Not on file  . Physically Abused: Not on file  . Sexually Abused: Not on file    Family History: Family History  Problem Relation Age of Onset  . Heart failure Mother   . Leukemia Mother   . Heart failure Father   . Allergic rhinitis Neg Hx   . Angioedema Neg Hx   . Asthma Neg Hx   . Eczema  Neg Hx   . Atopy Neg Hx   . Immunodeficiency Neg Hx   . Urticaria Neg Hx      Review of Systems: All other systems reviewed and are otherwise negative except as noted above.  Physical Exam: Vitals:   11/12/19 1223  BP: 94/66  Pulse: 75  SpO2: 98%  Weight: 128 lb (58.1 kg)  Height: 5\' 4"  (1.626 m)     GEN- The patient is well appearing, alert and oriented x 3 today.   HEENT: normocephalic, atraumatic; sclera clear, conjunctiva pink; hearing intact; oropharynx clear; neck supple  Lungs- Clear to ausculation bilaterally, normal work of breathing.  No wheezes, rales, rhonchi Heart- Regular rate and rhythm, no murmurs, rubs or gallops  GI- soft, non-tender, non-distended, bowel sounds present  Extremities- no clubbing, cyanosis, or edema  MS- no significant deformity or atrophy Skin- warm and dry, no rash or lesion; PPM pocket well healed Psych- euthymic mood, full affect Neuro- strength and sensation are intact  PPM Interrogation- reviewed in detail today,  See PACEART report  EKG:  EKG is ordered today. The ekg ordered today shows VP 75 bpm, underlying AF (s/p AV nodal ablation)  Recent Labs: 11/18/2018: TSH 3.484 01/20/2019: B Natriuretic Peptide 1,638.0 01/23/2019: Magnesium 2.3 08/20/2019: ALT 29; BUN 18; Creatinine, Ser 0.97; Hemoglobin 12.8; Platelets 194; Potassium 4.3; Sodium 137   Wt Readings from Last 3 Encounters:  11/12/19 128 lb (58.1 kg)  11/08/19 127 lb (57.6 kg)  10/17/19 129 lb 6.4 oz (58.7 kg)     Other studies Reviewed: Additional studies/ records that were reviewed  today include: Echo 11/19/2018 shows LVEF 40-45%, Previous EP office notes, Previous remote checks, Most recent labwork.   Assessment and Plan:  1. Persistent AF Doing well overall in this regard s/p AV nodal ablation Continue Eliquis for CHA2DS2VASC of at least 7   Planning high res CT to look for evidence of amiodarone pulmonary toxicity by Dr. Lamonte Sakai.  2.  CHB due to AV nodal ablation s/p St. Jude BiV PPM  Normal PPM function See Pace Art report No changes today  3. NICM/LBBB Denies symptoms of CHF Medical therapy per Dr. Harl Bowie  4. Deconditioning Encouraged activity as able. She is following with Dr. Lamonte Sakai for occasional DOE.   Current medicines are reviewed at length with the patient today.   The patient does not have concerns regarding her medicines.  The following changes were made today:  none  Labs/ tests ordered today include:  Orders Placed This Encounter  Procedures  . EKG 12-Lead   Disposition:   Follow up with Dr. Rayann Heman in 6 months. Sooner with symptoms.   Jacalyn Lefevre, PA-C  11/12/2019 2:02 PM  [place] [phone] (office) [phone] (fax)

## 2019-11-12 NOTE — Patient Instructions (Signed)
Medication Instructions:  Decrease ELIQUIS to 2.5 mg TWICE PER DAY *If you need a refill on your cardiac medications before your next appointment, please call your pharmacy*  Lab Work: none If you have labs (blood work) drawn today and your tests are completely normal, you will receive your results only by: Marland Kitchen MyChart Message (if you have MyChart) OR . A paper copy in the mail If you have any lab test that is abnormal or we need to change your treatment, we will call you to review the results.  Testing/Procedures: none  Follow-Up: At [facility], you and your health needs are our priority.  As part of our continuing mission to provide you with exceptional heart care, we have created designated Provider Care Teams.  These Care Teams include your primary Cardiologist (physician) and Advanced Practice Providers (APPs -  Physician Assistants and Nurse Practitioners) who all work together to provide you with the care you need, when you need it.  Your next appointment:   6 month(s)  The format for your next appointment:   Either In Person or Virtual  Provider:   Dr Rayann Heman  Other Instructions Remote monitoring is used to monitor your Pacemaker  from home. This monitoring reduces the number of office visits required to check your device to one time per year. It allows [place] to keep an eye on the functioning of your device to ensure it is working properly. You are scheduled for a device check from home on 02/11/20. You may send your transmission at any time that day. If you have a wireless device, the transmission will be sent automatically. After your physician reviews your transmission, you will receive a postcard with your next transmission date.

## 2019-11-12 NOTE — Progress Notes (Signed)
PPM Remote  

## 2019-11-15 ENCOUNTER — Ambulatory Visit: Payer: Medicare Other | Admitting: Gastroenterology

## 2019-11-15 ENCOUNTER — Ambulatory Visit: Payer: Medicare Other | Attending: Internal Medicine

## 2019-11-15 DIAGNOSIS — Z23 Encounter for immunization: Secondary | ICD-10-CM | POA: Insufficient documentation

## 2019-11-15 NOTE — Progress Notes (Signed)
   Covid-19 Vaccination Clinic  Name:  Carol Hardin    MRN: [id number] DOB: 12-10-1932  11/15/2019  [name] was observed post Covid-19 immunization for 15 minutes without incidence. She was provided with Vaccine Information Sheet and instruction to access the V-Safe system.   [name] was instructed to call 911 with any severe reactions post vaccine: Marland Kitchen Difficulty breathing  . Swelling of your face and throat  . A fast heartbeat  . A bad rash all over your body  . Dizziness and weakness    Immunizations Administered    Name Date Dose VIS Date Route   Pfizer COVID-19 Vaccine 11/15/2019 10:29 AM 0.3 mL 09/21/2019 Intramuscular   Manufacturer: [place]   Lot: [id number]   [place]: [id number]

## 2019-11-19 ENCOUNTER — Other Ambulatory Visit: Payer: Self-pay

## 2019-11-19 MED ORDER — APIXABAN 2.5 MG PO TABS: 2.5000 mg | ORAL_TABLET | Freq: Two times a day (BID) | ORAL | 6 refills | Status: DC

## 2019-11-19 NOTE — Telephone Encounter (Signed)
Prescription refill request for Eliquis received.  Last office visit: [place] 11/12/2019 Scr: 0.97, 08/20/2019 Age: 86 y.o Weight: 58.1kg   Prescription refill sent.

## 2019-11-19 NOTE — Telephone Encounter (Signed)
Patients Eliquis was decreased to 2.5 mg, 1 tablet by mouth twice a day on 11/12/19 by Joesph July. Patient needs RX sent in.

## 2019-11-20 ENCOUNTER — Ambulatory Visit: Payer: Medicare Other | Admitting: Gastroenterology

## 2019-11-22 ENCOUNTER — Ambulatory Visit (HOSPITAL_COMMUNITY)
Admission: RE | Admit: 2019-11-22 | Discharge: 2019-11-22 | Disposition: A | Discharge status: Home / Self Care | Payer: Medicare Other | Source: Ambulatory Visit | Attending: Emergency Medicine | Admitting: Emergency Medicine

## 2019-11-22 ENCOUNTER — Other Ambulatory Visit: Payer: Self-pay

## 2019-11-22 DIAGNOSIS — J849 Interstitial pulmonary disease, unspecified: Secondary | ICD-10-CM | POA: Diagnosis present

## 2019-12-17 ENCOUNTER — Telehealth: Payer: Self-pay | Admitting: Emergency Medicine

## 2019-12-17 NOTE — Telephone Encounter (Signed)
Attempted to call pt but unable to reach. Left message for pt to return call. 

## 2019-12-18 NOTE — Telephone Encounter (Signed)
Called and spoke with pt letting her know that we would need the nasal covid test and she verbalized understanding. Nothing further needed.

## 2019-12-18 NOTE — Telephone Encounter (Signed)
Pt returning a phone call. Pt can be reached at [phone].

## 2019-12-20 ENCOUNTER — Ambulatory Visit: Payer: Medicare Other | Admitting: Cardiology

## 2020-01-04 ENCOUNTER — Other Ambulatory Visit (HOSPITAL_COMMUNITY): Payer: Medicare Other

## 2020-01-04 ENCOUNTER — Other Ambulatory Visit (HOSPITAL_COMMUNITY)
Admission: RE | Admit: 2020-01-04 | Discharge: 2020-01-04 | Disposition: A | Discharge status: Home / Self Care | Payer: Medicare Other | Source: Ambulatory Visit | Attending: Emergency Medicine | Admitting: Emergency Medicine

## 2020-01-04 ENCOUNTER — Other Ambulatory Visit: Payer: Self-pay

## 2020-01-04 DIAGNOSIS — Z20822 Contact with and (suspected) exposure to covid-19: Secondary | ICD-10-CM | POA: Insufficient documentation

## 2020-01-04 DIAGNOSIS — Z01812 Encounter for preprocedural laboratory examination: Secondary | ICD-10-CM | POA: Insufficient documentation

## 2020-01-05 LAB — SARS CORONAVIRUS 2 (TAT 6-24 HRS): SARS Coronavirus 2: NEGATIVE

## 2020-01-07 ENCOUNTER — Other Ambulatory Visit: Payer: Self-pay

## 2020-01-07 ENCOUNTER — Encounter: Payer: Self-pay | Admitting: *Deleted

## 2020-01-07 ENCOUNTER — Ambulatory Visit: Payer: Medicare Other | Admitting: Primary Care

## 2020-01-07 ENCOUNTER — Ambulatory Visit (INDEPENDENT_AMBULATORY_CARE_PROVIDER_SITE_OTHER): Payer: Medicare Other | Admitting: Emergency Medicine

## 2020-01-07 VITALS — BP 106/64 | HR 72 | Temp 97.8°F | Ht 64.0 in | Wt 126.0 lb

## 2020-01-07 DIAGNOSIS — R0600 Dyspnea, unspecified: Secondary | ICD-10-CM | POA: Diagnosis not present

## 2020-01-07 DIAGNOSIS — F411 Generalized anxiety disorder: Secondary | ICD-10-CM

## 2020-01-07 DIAGNOSIS — R29898 Other symptoms and signs involving the musculoskeletal system: Secondary | ICD-10-CM | POA: Diagnosis not present

## 2020-01-07 DIAGNOSIS — T462X1A Poisoning by other antidysrhythmic drugs, accidental (unintentional), initial encounter: Secondary | ICD-10-CM

## 2020-01-07 DIAGNOSIS — M629 Disorder of muscle, unspecified: Secondary | ICD-10-CM | POA: Insufficient documentation

## 2020-01-07 DIAGNOSIS — J849 Interstitial pulmonary disease, unspecified: Secondary | ICD-10-CM

## 2020-01-07 DIAGNOSIS — I5030 Unspecified diastolic (congestive) heart failure: Secondary | ICD-10-CM | POA: Diagnosis not present

## 2020-01-07 DIAGNOSIS — R0609 Other forms of dyspnea: Secondary | ICD-10-CM

## 2020-01-07 LAB — PULMONARY FUNCTION TEST
DL/VA % pred: 94 %
DL/VA: 3.83 ml/min/mmHg/L
DLCO cor % pred: 58 %
DLCO cor: 10.82 ml/min/mmHg
DLCO unc % pred: 58 %
DLCO unc: 10.82 ml/min/mmHg
FEF 25-75 Post: 2.17 L/sec
FEF 25-75 Pre: 1.71 L/sec
FEF2575-%Change-Post: 27 %
FEF2575-%Pred-Post: 180 %
FEF2575-%Pred-Pre: 141 %
FEV1-%Change-Post: 5 %
FEV1-%Pred-Post: 96 %
FEV1-%Pred-Pre: 90 %
FEV1-Post: 1.73 L
FEV1-Pre: 1.64 L
FEV1FVC-%Change-Post: 7 %
FEV1FVC-%Pred-Pre: 111 %
FEV6-%Change-Post: -1 %
FEV6-%Pred-Post: 86 %
FEV6-%Pred-Pre: 88 %
FEV6-Post: 1.99 L
FEV6-Pre: 2.02 L
FEV6FVC-%Pred-Post: 106 %
FEV6FVC-%Pred-Pre: 106 %
FVC-%Change-Post: -1 %
FVC-%Pred-Post: 81 %
FVC-%Pred-Pre: 82 %
FVC-Post: 1.99 L
FVC-Pre: 2.02 L
Post FEV1/FVC ratio: 87 %
Post FEV6/FVC ratio: 100 %
Pre FEV1/FVC ratio: 81 %
Pre FEV6/FVC Ratio: 100 %
RV % pred: 70 %
RV: 1.73 L
TLC % pred: 88 %
TLC: 4.48 L

## 2020-01-07 NOTE — Progress Notes (Signed)
Full PFT performed today. °

## 2020-01-07 NOTE — Progress Notes (Signed)
@Patient  ID: Carol Hardin, female    DOB: June 29, 1933, 86 y.o.   MRN: [id number]  Chief Complaint  Patient presents with  . Follow-up    Referring provider: Celene Squibb, MD  HPI: 86 year old female, former smoker quit in 1989 (20-pack-year history).  Past medical history significant for chronic respiratory failure, community-acquired pneumonia, A. Fib (on Eliquis), AV node ablation, hypertension, congestive heart failure, mitral valve prolapse, tachy-brady syndrome, IBS, osteoporosis, chronic kidney disease stage III.    Patient of Dr. Lamonte Sakai, seen for initial consult on 11/08/2019 for dyspnea.  Patient reports difficulty taking a deep breath.  She had spirometry in January 2021 but this was poor study due to low expiratory time.  CT scan of abdomen showed some possible basilar air trapping. No evidence of amiodarone toxicity on chest xray. She does have a history of tobacco use and is at risk for some obstructive lung disease. Ordered for HRCT and PFTs to assess for restrictive or obstructive lung disease. Follows with Cardiology, Dr. Jackalyn [place] office, last seen in February. Recommended next visit in 6 months.   01/07/2020 Patient presents today for a follow-up visit with pulmonary function testing. HRCT in February showed no evidence of ILD or amiodarone toxicity. She remains very worried about this. She is greatly bothered by her weight loss and upper body tone. States that she has a great appetite but has chronic gastrointestinal problems. Reports that she has lost around 20lbs in about 2 years. She has been seen by GI and nutrition. She does not feel like herself. She has a lot of anxiety and is uncomfortable with her body.   Imaging: HRCT 11/23/19- No evidence on ILD or amiodarone toxicity. Mild basilar predominant septal thickening suggests pulmonary edema. Air trapping  Indicative of small airway disease. Mild mediastinal adenopathy, possibly reactive. Difficult to definitively exclude  a lymphoproliferative disorder. Aortic atherosclerosis. Coronary artery calcification. Enlarged pulmonic trunk, indicative of pulmonary arterial hypertension.  Cardiac: Echocardiogram 11/19/18- LVEF 40-45%   PFTs: 01/07/2020- FVC 1.99 (81%), FEV1 1.73 (96%), ratio 87, TLC 88%, DLCOcor 58%, DL/VA 94% Normal spirometry. No bronchodilator response. Decreased diffusion capacity which corrects for lung volumes  Allergies  Allergen Reactions  . Omeprazole Other (See Comments)    siezure  . Flagyl [Metronidazole] Nausea And Vomiting  . Amiodarone   . Aripiprazole Other (See Comments)    Unknown reaction  . Hylan G-F 20 Other (See Comments)    Unknown reaction  . Paroxetine Hcl Other (See Comments)    Headaches (a long time ago- pt doesn't really remember)  . Statins Other (See Comments)    No specific reaction given-patient states that she was advised not to take by physician  . Sulfa Antibiotics Diarrhea and Other (See Comments)    Colitis   . Toprol Xl [Metoprolol Succinate] Other (See Comments)    Dizziness--pt doesn't really remember   . Vancomycin Itching and Other (See Comments)    Pt reports that they gave it too fast- she started having itching in the scalp  . Penicillins Rash    DID THE REACTION INVOLVE: Swelling of the face/tongue/throat, SOB, or low BP? No Sudden or severe rash/hives, skin peeling, or the inside of the mouth or nose? Yes Did it require medical treatment? Unknown When did it last happen?Over 10 years If all above answers are "NO", may proceed with cephalosporin use.     Immunization History  Administered Date(s) Administered  . Influenza Whole 08/08/2008, 07/29/2009  . Influenza-Unspecified 07/26/2016  .  PFIZER SARS-COV-2 Vaccination 10/28/2019, 11/15/2019    Past Medical History:  Diagnosis Date  . Anxiety   . Arthritis    "fingers, right toe" (08/09/2016)  . Basal cell carcinoma of lower leg, right   . Bradycardia    a. Holter 08/30/16  showed profound bradycardia down to 30 during awake hours, several 2 second pauses, very frequent PVCs >8000 in 48 hours, and NSVT (longest of 14 beats).  . Cardiomyopathy ([place])    a. EF 40% by echo 08/2016.  . Daily headache    "I usually wake up w/a headache; sometimes it's a migraine" (08/09/2016)  . Depression   . Diverticulosis    Hx. of  . Frequent PVCs   . GERD (gastroesophageal reflux disease)   . Hypercholesterolemia   . Migraine   . Mitral regurgitation   . MVP (mitral valve prolapse)   . NSVT (nonsustained ventricular tachycardia) ([place])    a. first noted event monitor 08/2016.  . Osteoporosis   . PAF (paroxysmal atrial fibrillation) ([place])    a. in afib at time of echo 08/2016.  Marland Kitchen Pneumonia   . Scoliosis    mild  . Squamous cell carcinoma of neck     Tobacco History: Social History   Tobacco Use  Smoking Status Former Smoker  . Packs/day: 1.00  . Years: 20.00  . Pack years: 20.00  . Types: Cigarettes  . Quit date: 34  . Years since quitting: 32.2  Smokeless Tobacco Never Used   Counseling given: Not Answered   Outpatient Medications Prior to Visit  Medication Sig Dispense Refill  . acetaminophen (TYLENOL) 325 MG tablet Take 650 mg by mouth every 6 (six) hours as needed.    Marland Kitchen apixaban (ELIQUIS) 2.5 MG TABS tablet Take 1 tablet (2.5 mg total) by mouth 2 (two) times daily. 60 tablet 6  . Calcium Carbonate Antacid (TUMS PO) Take by mouth as needed.    . docusate sodium (COLACE) 100 MG capsule Take 100 mg by mouth daily as needed for mild constipation.    . Emollient (MEDERMA AG FACE) CREA Apply 1 application topically daily.    . Ensure (ENSURE) Take 237 mLs by mouth daily.     . furosemide (LASIX) 20 MG tablet Take 20 mg by mouth as needed for fluid.    Marland Kitchen ipratropium (ATROVENT) 0.06 % nasal spray Place 1 spray into both nostrils 3 (three) times daily. 15 mL 5  . LORazepam (ATIVAN) 1 MG tablet Take 1 mg by mouth at bedtime.    [name] Glycol-Propyl  Glycol (SYSTANE OP) Apply to eye as needed.    . Pramoxine-Ammonium Lactate (AMLACTIN AP EX) Apply 1 application topically daily.    . Probiotic Product (ALIGN PO) Take by mouth daily.    . Psyllium (METAMUCIL FIBER PO) Take by mouth daily. 1 1/2 teaspoons daily     No facility-administered medications prior to visit.   Review of Systems  Review of Systems  Constitutional: Positive for fatigue and unexpected weight change.  Respiratory: Positive for shortness of breath. Negative for cough and wheezing.   Cardiovascular: Positive for leg swelling. Negative for chest pain.  Neurological: Positive for weakness.   Physical Exam  BP 106/64 (BP Location: Left Arm, Cuff Size: Normal)   Pulse 72   Temp 97.8 F (36.6 C) (Temporal)   Ht 5\' 4"  (1.626 m)   Wt 126 lb (57.2 kg)   SpO2 96% Comment: RA  BMI 21.63 kg/m  Physical Exam Constitutional:  General: She is not in acute distress.    Appearance: She is not diaphoretic.  HENT:     Head: Normocephalic and atraumatic.     Mouth/Throat:     Comments: Deferred d/t masking Cardiovascular:     Rate and Rhythm: Normal rate.     Comments: +2BLE edema R>L Pulmonary:     Effort: Pulmonary effort is normal.     Breath sounds: No wheezing or rhonchi.     Comments: Lungs are clear, no rales or crackles Musculoskeletal:     Comments: Loss of upper body muscle tone; Normal gait  Neurological:     General: No focal deficit present.     Mental Status: She is alert and oriented to person, place, and time. Mental status is at baseline.     Gait: Gait normal.     Comments: Loss of upper body muscle tone; normal gait  Psychiatric:        Thought Content: Thought content normal.        Judgment: Judgment normal.     Comments: Anxious/tearful      Lab Results:  CBC    Component Value Date/Time   WBC 5.7 08/20/2019 1643   RBC 4.01 08/20/2019 1643   HGB 12.8 08/20/2019 1643   HCT 41.2 08/20/2019 1643   PLT 194 08/20/2019 1643   MCV  102.7 (H) 08/20/2019 1643   MCH 31.9 08/20/2019 1643   MCHC 31.1 08/20/2019 1643   RDW 14.3 08/20/2019 1643   LYMPHSABS 1.2 08/20/2019 1643   MONOABS 0.6 08/20/2019 1643   EOSABS 0.1 08/20/2019 1643   BASOSABS 0.0 08/20/2019 1643    BMET    Component Value Date/Time   NA 137 08/20/2019 1643   K 4.3 08/20/2019 1643   CL 105 08/20/2019 1643   CO2 24 08/20/2019 1643   GLUCOSE 111 (H) 08/20/2019 1643   BUN 18 08/20/2019 1643   CREATININE 0.97 08/20/2019 1643   CREATININE 0.93 (H) 09/22/2016 0933   CALCIUM 9.4 08/20/2019 1643   GFRNONAA 54 (L) 08/20/2019 1643   GFRAA >60 08/20/2019 1643    BNP    Component Value Date/Time   BNP 1,638.0 (H) 01/20/2019 1637    ProBNP No results found for: PROBNP  Imaging: No results found.   Assessment & Plan:   DOE (dyspnea on exertion) - No clear underlying pulmonary cause of dyspnea. She does have some small airway disease but no overt restriction or obstruction on her pulmonary function testing.  - HRCT showed no evidence of ILD or amiodarone toxicity. Air trapping indicative of small airway disease.  Generalized anxiety disorder - Refer to behavioral health   Muscular deconditioning - Decreased upper body muscle tone/mass - Refer to physical therapy   Congestive heart failure with left ventricular diastolic dysfunction, unspecified failure chronicity ([place]) - Prescribed lasix 20mg  as needed, she is supposed to be taking 4 days a week (not taking regularly) - On exam patient had +2 BLE edema  - Checking BNP   Martyn Ehrich, NP 01/07/2020

## 2020-01-07 NOTE — Assessment & Plan Note (Addendum)
-   Decreased upper body muscle tone/mass - Refer to physical therapy

## 2020-01-07 NOTE — Patient Instructions (Addendum)
Your high resolution CAT scan showed no evidence of interstitial lung disease or amiodarone toxicity Pulmonary function testing today showed no evidence of restrictive or obstructive lung disease  Labs: - BNP   Refer: - Physical therapy re: deconditioning  - Behavioral therapy re: generalized anxiety disorder   Follow-up: - 2-3 months with Dr. Lamonte Sakai or Eustaquio Maize

## 2020-01-07 NOTE — Assessment & Plan Note (Signed)
-   Prescribed lasix 20mg  as needed, she is supposed to be taking 4 days a week (not taking regularly) - On exam patient had +2 BLE edema  - Checking BNP

## 2020-01-07 NOTE — Assessment & Plan Note (Signed)
-   Refer to behavioral health

## 2020-01-07 NOTE — Assessment & Plan Note (Signed)
-   No clear underlying pulmonary cause of dyspnea. She does have some small airway disease but no overt restriction or obstruction on her pulmonary function testing.  - HRCT showed no evidence of ILD or amiodarone toxicity. Air trapping indicative of small airway disease.

## 2020-01-08 LAB — BRAIN NATRIURETIC PEPTIDE: Pro B Natriuretic peptide (BNP): 569 pg/mL — ABNORMAL HIGH (ref 0.0–100.0)

## 2020-01-08 NOTE — Progress Notes (Signed)
Please let patient know her fluid level was elevated. She should resume taking her lasix as directed tues/thurs/sat/sun and follow up with cardiology

## 2020-01-09 ENCOUNTER — Telehealth: Payer: Self-pay | Admitting: Primary Care

## 2020-01-09 NOTE — Telephone Encounter (Signed)
Called and spoke with Patient's Husband, Collins Scotland.  Collins Scotland stated Patient received her results earlier today.   Message from Emery, CMA-01/09/20 results-  Spoke with patient regarding her BNP. fluid level was elevated. She should resume taking her lasix as directed tues/thurs/sat/sun and follow up with cardiology. Patient's voice was understanding.Nothing else further needed.

## 2020-01-23 ENCOUNTER — Encounter (HOSPITAL_COMMUNITY): Payer: Self-pay | Admitting: Physical Therapy

## 2020-01-23 ENCOUNTER — Other Ambulatory Visit: Payer: Self-pay

## 2020-01-23 ENCOUNTER — Ambulatory Visit (HOSPITAL_COMMUNITY): Payer: Medicare Other | Attending: Primary Care | Admitting: Physical Therapy

## 2020-01-23 DIAGNOSIS — G8929 Other chronic pain: Secondary | ICD-10-CM | POA: Diagnosis present

## 2020-01-23 DIAGNOSIS — R262 Difficulty in walking, not elsewhere classified: Secondary | ICD-10-CM | POA: Diagnosis present

## 2020-01-23 DIAGNOSIS — M6281 Muscle weakness (generalized): Secondary | ICD-10-CM | POA: Insufficient documentation

## 2020-01-23 DIAGNOSIS — M25562 Pain in left knee: Secondary | ICD-10-CM | POA: Diagnosis present

## 2020-01-23 NOTE — Therapy (Signed)
[facility] [place], [place], [address] Phone: [phone]   Fax:  [phone]  Physical Therapy Evaluation  Patient Details  Name: Carol Hardin MRN: [id number] Date of Birth: Dec 19, 1932 Referring Provider (PT): Geraldo Pitter, NP   Encounter Date: 01/23/2020  PT End of Session - 01/23/20 1402    Visit Number  1    Number of Visits  12    Date for PT Re-Evaluation  03/05/20   POC dates 01/23/20 to 03/05/20   Authorization Type  [employer] Medicare. No auth, No VL, Co- insurance 10%    Progress Note Due on Visit  10    PT Start Time  1403    PT Stop Time  1443    PT Time Calculation (min)  40 min    Equipment Utilized During Treatment  Gait belt    Activity Tolerance  Patient tolerated treatment well    Behavior During Therapy  WFL for tasks assessed/performed       Past Medical History:  Diagnosis Date  . Anxiety   . Arthritis    "fingers, right toe" (08/09/2016)  . Basal cell carcinoma of lower leg, right   . Bradycardia    a. Holter 08/30/16 showed profound bradycardia down to 30 during awake hours, several 2 second pauses, very frequent PVCs >8000 in 48 hours, and NSVT (longest of 14 beats).  . Cardiomyopathy ([place])    a. EF 40% by echo 08/2016.  . Daily headache    "I usually wake up w/a headache; sometimes it's a migraine" (08/09/2016)  . Depression   . Diverticulosis    Hx. of  . Frequent PVCs   . GERD (gastroesophageal reflux disease)   . Hypercholesterolemia   . Migraine   . Mitral regurgitation   . MVP (mitral valve prolapse)   . NSVT (nonsustained ventricular tachycardia) ([place])    a. first noted event monitor 08/2016.  . Osteoporosis   . PAF (paroxysmal atrial fibrillation) ([place])    a. in afib at time of echo 08/2016.  Marland Kitchen Pneumonia   . Scoliosis    mild  . Squamous cell carcinoma of neck     Past Surgical History:  Procedure Laterality Date  . AV NODE ABLATION N/A 01/23/2019   Procedure: AV  NODE ABLATION;  Surgeon: Thompson Grayer, MD;  Location: [facility];  Service: Cardiovascular;  Laterality: N/A;  . BASAL CELL CARCINOMA EXCISION Right    RLE  . BIV PACEMAKER INSERTION CRT-P N/A 01/23/2019   Procedure: BIV PACEMAKER INSERTION CRT-P;  Surgeon: Thompson Grayer, MD;  Location: [facility];  Service: Cardiovascular;  Laterality: N/A;  . BLEPHAROPLASTY    . BREAST BIOPSY Left   . BREAST CYST ASPIRATION Left   . BREAST CYST EXCISION Left   . BUNIONECTOMY Bilateral   . DILATION AND CURETTAGE OF UTERUS    . EXCISIONAL HEMORRHOIDECTOMY    . INTRAMEDULLARY (IM) NAIL INTERTROCHANTERIC Left 12/31/2017   Procedure: INTRAMEDULLARY (IM) NAIL INTERTROCHANTRIC;  Surgeon: Nicholes Stairs, MD;  Location: [facility];  Service: Orthopedics;  Laterality: Left;  . KNEE ARTHROSCOPY Right 04/12/2007  . KNEE ARTHROSCOPY Left   . SQUAMOUS CELL CARCINOMA EXCISION     "neck"  . TONSILLECTOMY      There were no vitals filed for this visit.   Subjective Assessment - 01/23/20 1523    Subjective  Patient complains of weakness and decreased mobility for a while now. States she can't gain muscle  and has been losing weight. States that her husband fractured his hip in February and he was the primary cook at that time. Now she is the primary caregiver and they are eating different things as it is difficult for them to prepare foods. States that last year she fractured her hip and then she also had a stroke (though she recovered completely from the stroke). This year she had a pacemaker put in and she feels like her body is not responding well to it. Reports that she really wants to be able to walk around her neighborhood but feels like she can't because there is no place to sit and she is weak. Reports that she has to sit after a few minutes of house work due to pain in her back and knee. States she used to be so independent and now she feels so dependent on others.    Pertinent History  CHF,  Shortness of breath, hosteoporosis, depression    Limitations  Walking;Standing;House hold activities    Patient Stated Goals  to be able to walk around the block    Currently in Pain?  Yes    Pain Score  6     Pain Location  Knee    Pain Orientation  Left    Pain Descriptors / Indicators  Aching    Pain Type  Chronic pain         [facility] PT Assessment - 01/23/20 0001      Assessment   Medical Diagnosis  Deconditioning    Referring Provider (PT)  Geraldo Pitter, NP    Prior Therapy  yes      Precautions   Precautions  Fall      Restrictions   Weight Bearing Restrictions  No      Balance Screen   Has the patient fallen in the past 6 months  Yes    How many times?  1    Has the patient had a decrease in activity level because of a fear of falling?   Yes    Is the patient reluctant to leave their home because of a fear of falling?   No      Home Social worker  Private residence    Living Arrangements  Spouse/significant other    Available Help at Discharge  Family   son at [place] sometimes, son in [place]   Type of [place] to enter    Entrance Stairs-Number of Steps  [place] or work area in [place] - 2 wheels;Cane - single point;Shower seat;Grab bars - tub/shower      Cognition   Overall Cognitive Status  Within Functional Limits for tasks assessed      Observation/Other Assessments   Focus on Therapeutic Outcomes (FOTO)   NA      Transfers   Transfers  Sit to Stand;Stand to Sit    Sit to Stand  With upper extremity assist    Five time sit to stand comments   17.3 seconds - left knee pain       Ambulation/Gait   Ambulation/Gait  Yes    Ambulation/Gait Assistance  5: Supervision    Ambulation Distance (Feet)  196 Feet    Assistive device  Straight cane    Gait Pattern  Decreased arm swing - right;Decreased arm swing -  left;Decreased  step length - right;Decreased step length - left;Decreased hip/knee flexion - left;Trunk flexed    Ambulation Surface  Level;Indoor    Gait velocity  decreased    Gait Comments  2MW      Balance   Balance Assessed  Yes      Static Standing Balance   Static Standing - Comment/# of Minutes  tandem right posterior - 30 sec  increased RR, left posterior 20 seconds, SLS  L 30 seconds - left hurt knee, R SLS- more swaying 30 seconds                 Objective measurements completed on examination: See above findings.              PT Education - 01/23/20 1506    Education Details  educated patietnin overall healthy diet to increase nutrient dense food, frozen veggie options and talking to MD about boost/ensure shakes. Discussed PT POC    Person(s) Educated  Patient    Methods  Explanation    Comprehension  Verbalized understanding       PT Short Term Goals - 01/23/20 1524      PT SHORT TERM GOAL #1   Title  Patient will be independent in self management strategies to improve quality of life and functional outcomes.    Time  3    Period  Weeks    Status  New    Target Date  02/13/20      PT SHORT TERM GOAL #2   Title  Patient will report at least 25% improvement in overall symptoms and/or functional ability.    Time  3    Period  Weeks    Status  New    Target Date  02/13/20        PT Long Term Goals - 01/23/20 1524      PT LONG TERM GOAL #1   Title  Patient will report at least 50% improvement in overall symptoms and/or functional ability.    Time  6    Period  Weeks    Status  New    Target Date  03/05/20      PT LONG TERM GOAL #2   Title  Patient will be able to walk at least 5 minutes without sititng down to rest to improve ability to walk around neighborhood.    Time  6    Period  Weeks    Status  New    Target Date  03/05/20      PT LONG TERM GOAL #3   Title  Patient will be able to stand on either leg for at least 30 seconds to demonstrate  improved static balance.    Time  6    Period  Weeks    Status  New    Target Date  03/05/20             Plan - 01/23/20 1509    Clinical Impression Statement  Patient presents with generalized weakness that is limiting her ability to perform required daily tasks like cooking and cleaning. In addition to her deconditioning, she also has left knee pain and back pain that limits her participation in daily activities. She would like to be able to stand for longer periods of time to prepare meals and to be able to walk around her neighborhood. Patient would greatly benefit from skilled physical therapy to improve functional mobility and tolerance to daily activities.    Personal Factors  and Comorbidities  Age;Comorbidity 1;Comorbidity 2;Comorbidity 3+    Comorbidities  pacemaker, afib, diverticulitis, recent weight loss, stroke, shortness of breath    Examination-Activity Limitations  Bathing;Bend;Stand;Stairs;Squat    Examination-Participation Restrictions  Cleaning;Community Activity;Laundry;Meal Prep    Stability/Clinical Decision Making  Evolving/Moderate complexity    Clinical Decision Making  Moderate    Rehab Potential  Good    PT Frequency  2x / week    PT Duration  6 weeks    PT Treatment/Interventions  ADLs/Self Care Home Management;Aquatic Therapy;Cryotherapy;Electrical Stimulation;DME Instruction;Traction;Moist Heat;Gait training;Stair training;Functional mobility training;Therapeutic activities;Therapeutic exercise;Balance training;Patient/family education;Neuromuscular re-education;Dry needling;Energy conservation;Passive range of motion    PT Next Visit Plan  assess left knee and knee pain. balance and walking endurance, strengthening exercises    PT Home Exercise Plan  establish next session    Consulted and Agree with Plan of Care  Patient       Patient will benefit from skilled therapeutic intervention in order to improve the following deficits and impairments:   Decreased activity tolerance, Decreased balance, Decreased mobility, Decreased strength, Increased edema, Pain, Decreased endurance, Abnormal gait, Decreased range of motion, Difficulty walking  Visit Diagnosis: Difficulty in walking, not elsewhere classified  Muscle weakness (generalized)  Chronic pain of left knee     Problem List Patient Active Problem List   Diagnosis Date Noted  . Muscular deconditioning 01/07/2020  . Dysphagia 03/30/2019  . Constipation 03/30/2019  . Pacemaker   . Chronic respiratory failure with hypoxia ([place])   . On home O2   . Sick sinus syndrome ([place]) 01/22/2019  . CKD (chronic kidney disease) stage 3, GFR 30-59 ml/min 01/21/2019  . Atrial fibrillation with RVR ([place]) 01/20/2019  . Acute on chronic systolic CHF (congestive heart failure) ([place]) 12/25/2018  . Tachy-brady syndrome ([place]) 12/25/2018  . Atrial fibrillation, chronic ([place]) 12/24/2018  . Congestive heart failure with left ventricular diastolic dysfunction, unspecified failure chronicity ([place]) 12/24/2018  . Malnutrition of moderate degree 11/20/2018  . Atrial fibrillation with rapid ventricular response ([place]) 11/18/2018  . GERD (gastroesophageal reflux disease) 11/18/2018  . CAP (community acquired pneumonia) 11/18/2018  . Acute on chronic diastolic HF (heart failure) ([place]) 11/18/2018  . Stroke (cerebrum) ([place]) 05/19/2018  . Hip fracture ([place]) 12/31/2017  . PAF (paroxysmal atrial fibrillation) ([place])   . NSVT (nonsustained ventricular tachycardia) ([place])   . Mitral regurgitation   . Frequent PVCs   . Bradycardia   . Essential hypertension   . Hypokalemia   . Diverticulitis 08/09/2016  . Diverticulosis of colon 04/22/2016  . Diverticulosis of large intestine without perforation or abscess without bleeding 04/22/2016  . Generalized anxiety disorder 04/22/2016  . Irritable bowel syndrome without diarrhea 04/22/2016  . Mitral valve prolapse 04/22/2016  . Mixed hyperlipidemia 04/22/2016  .  Osteopenia 04/22/2016  . Low blood pressure 09/24/2014  . Hypercholesterolemia   . History of depression   . Scoliosis   . Osteoporosis   . DOE (dyspnea on exertion) 05/31/2008    5:29 PM, 01/23/20 Jerene Pitch, DPT Physical Therapy with [facility]  [phone] office  [facility] [address] [place], [place], [address] Phone: [phone]   Fax:  [phone]  Name: Carol Hardin MRN: [id number] Date of Birth: 1933/08/21

## 2020-01-25 ENCOUNTER — Ambulatory Visit: Payer: Medicare Other | Admitting: Cardiology

## 2020-01-25 ENCOUNTER — Encounter: Payer: Self-pay | Admitting: Cardiology

## 2020-01-25 ENCOUNTER — Other Ambulatory Visit: Payer: Self-pay

## 2020-01-25 VITALS — BP 106/86 | HR 60 | Ht 65.0 in | Wt 125.6 lb

## 2020-01-25 DIAGNOSIS — I5022 Chronic systolic (congestive) heart failure: Secondary | ICD-10-CM | POA: Diagnosis not present

## 2020-01-25 DIAGNOSIS — I4891 Unspecified atrial fibrillation: Secondary | ICD-10-CM

## 2020-01-25 NOTE — Patient Instructions (Signed)
Your physician recommends that you schedule a follow-up appointment in: [place]  Your physician recommends that you continue on your current medications as directed. Please refer to the Current Medication list given to you today.  Your physician has requested that you have an echocardiogram. Echocardiography is a painless test that uses sound waves to create images of your heart. It provides your doctor with information about the size and shape of your heart and how well your heart's chambers and valves are working. This procedure takes approximately one hour. There are no restrictions for this procedure.   Thank you for choosing [facility]!!

## 2020-01-25 NOTE — Progress Notes (Signed)
Clinical Summary Ms. Ekman is a 86 y.o.female seen today for follow up of the following medical problems.  1. Afib with Tachy-brady syndrome - s/p av nodal ablation and pacemaker placement  - no recent palpitaitons.     2.Chronicsystolic HF -01/5408 echo LVEF 40-45%, moderate RV dysfunction, moderate pleural effusion, moderaet MR - LVEF 2018 55-60%. Perhaps tachy medicated CM - volume overloaded during recent admission, diuresed. Did not requrie discharge diuretic, remains euvolemic. Suspect decompensation due to pneumonia and afib with RVR. - bp's too soft to consider additional CHF meds   - no recent SOB or LE edema. Fluid status has done much better now that her rhythms have been stable.   - pulm notes mention has not been taking lasix, had LE edema 2+ at there visit later March. BNP was 569  - CT chest did show suggestion of pulmonary edema - takes lasix 2 days a week.      3. Generalized anxiety disorder - on ativan at home - quite anxious in general about medications, medical procedures, and her overall health.      Past Medical History:  Diagnosis Date  . Anxiety   . Arthritis    "fingers, right toe" (08/09/2016)  . Basal cell carcinoma of lower leg, right   . Bradycardia    a. Holter 08/30/16 showed profound bradycardia down to 30 during awake hours, several 2 second pauses, very frequent PVCs >8000 in 48 hours, and NSVT (longest of 14 beats).  . Cardiomyopathy ([place])    a. EF 40% by echo 08/2016.  . Daily headache    "I usually wake up w/a headache; sometimes it's a migraine" (08/09/2016)  . Depression   . Diverticulosis    Hx. of  . Frequent PVCs   . GERD (gastroesophageal reflux disease)   . Hypercholesterolemia   . Migraine   . Mitral regurgitation   . MVP (mitral valve prolapse)   . NSVT (nonsustained ventricular tachycardia) ([place])    a. first noted event monitor 08/2016.  . Osteoporosis   . PAF (paroxysmal atrial  fibrillation) ([place])    a. in afib at time of echo 08/2016.  Marland Kitchen Pneumonia   . Scoliosis    mild  . Squamous cell carcinoma of neck      Allergies  Allergen Reactions  . Omeprazole Other (See Comments)    siezure  . Flagyl [Metronidazole] Nausea And Vomiting  . Amiodarone   . Aripiprazole Other (See Comments)    Unknown reaction  . Hylan G-F 20 Other (See Comments)    Unknown reaction  . Paroxetine Hcl Other (See Comments)    Headaches (a long time ago- pt doesn't really remember)  . Statins Other (See Comments)    No specific reaction given-patient states that she was advised not to take by physician  . Sulfa Antibiotics Diarrhea and Other (See Comments)    Colitis   . Toprol Xl [Metoprolol Succinate] Other (See Comments)    Dizziness--pt doesn't really remember   . Vancomycin Itching and Other (See Comments)    Pt reports that they gave it too fast- she started having itching in the scalp  . Penicillins Rash    DID THE REACTION INVOLVE: Swelling of the face/tongue/throat, SOB, or low BP? No Sudden or severe rash/hives, skin peeling, or the inside of the mouth or nose? Yes Did it require medical treatment? Unknown When did it last happen?Over 10 years If all above answers are "NO", may proceed with cephalosporin  use.      Current Outpatient Medications  Medication Sig Dispense Refill  . acetaminophen (TYLENOL) 325 MG tablet Take 650 mg by mouth every 6 (six) hours as needed.    Marland Kitchen apixaban (ELIQUIS) 2.5 MG TABS tablet Take 1 tablet (2.5 mg total) by mouth 2 (two) times daily. 60 tablet 6  . Calcium Carbonate Antacid (TUMS PO) Take by mouth as needed.    . docusate sodium (COLACE) 100 MG capsule Take 100 mg by mouth daily as needed for mild constipation.    . Emollient (MEDERMA AG FACE) CREA Apply 1 application topically daily.    . Ensure (ENSURE) Take 237 mLs by mouth daily.     . furosemide (LASIX) 20 MG tablet Take 20 mg by mouth as needed for fluid.    Marland Kitchen  ipratropium (ATROVENT) 0.06 % nasal spray Place 1 spray into both nostrils 3 (three) times daily. 15 mL 5  . LORazepam (ATIVAN) 1 MG tablet Take 1 mg by mouth at bedtime.    [name] Glycol-Propyl Glycol (SYSTANE OP) Apply to eye as needed.    . Pramoxine-Ammonium Lactate (AMLACTIN AP EX) Apply 1 application topically daily.    . Probiotic Product (ALIGN PO) Take by mouth daily.    . Psyllium (METAMUCIL FIBER PO) Take by mouth daily. 1 1/2 teaspoons daily     No current facility-administered medications for this visit.     Past Surgical History:  Procedure Laterality Date  . AV NODE ABLATION N/A 01/23/2019   Procedure: AV NODE ABLATION;  Surgeon: Thompson Grayer, MD;  Location: [facility];  Service: Cardiovascular;  Laterality: N/A;  . BASAL CELL CARCINOMA EXCISION Right    RLE  . BIV PACEMAKER INSERTION CRT-P N/A 01/23/2019   Procedure: BIV PACEMAKER INSERTION CRT-P;  Surgeon: Thompson Grayer, MD;  Location: [facility];  Service: Cardiovascular;  Laterality: N/A;  . BLEPHAROPLASTY    . BREAST BIOPSY Left   . BREAST CYST ASPIRATION Left   . BREAST CYST EXCISION Left   . BUNIONECTOMY Bilateral   . DILATION AND CURETTAGE OF UTERUS    . EXCISIONAL HEMORRHOIDECTOMY    . INTRAMEDULLARY (IM) NAIL INTERTROCHANTERIC Left 12/31/2017   Procedure: INTRAMEDULLARY (IM) NAIL INTERTROCHANTRIC;  Surgeon: Nicholes Stairs, MD;  Location: [facility];  Service: Orthopedics;  Laterality: Left;  . KNEE ARTHROSCOPY Right 04/12/2007  . KNEE ARTHROSCOPY Left   . SQUAMOUS CELL CARCINOMA EXCISION     "neck"  . TONSILLECTOMY       Allergies  Allergen Reactions  . Omeprazole Other (See Comments)    siezure  . Flagyl [Metronidazole] Nausea And Vomiting  . Amiodarone   . Aripiprazole Other (See Comments)    Unknown reaction  . Hylan G-F 20 Other (See Comments)    Unknown reaction  . Paroxetine Hcl Other (See Comments)    Headaches (a long time ago- pt doesn't really remember)  . Statins  Other (See Comments)    No specific reaction given-patient states that she was advised not to take by physician  . Sulfa Antibiotics Diarrhea and Other (See Comments)    Colitis   . Toprol Xl [Metoprolol Succinate] Other (See Comments)    Dizziness--pt doesn't really remember   . Vancomycin Itching and Other (See Comments)    Pt reports that they gave it too fast- she started having itching in the scalp  . Penicillins Rash    DID THE REACTION INVOLVE: Swelling of the face/tongue/throat, SOB, or low BP?  No Sudden or severe rash/hives, skin peeling, or the inside of the mouth or nose? Yes Did it require medical treatment? Unknown When did it last happen?Over 10 years If all above answers are "NO", may proceed with cephalosporin use.       Family History  Problem Relation Age of Onset  . Heart failure Mother   . Leukemia Mother   . Heart failure Father   . Allergic rhinitis Neg Hx   . Angioedema Neg Hx   . Asthma Neg Hx   . Eczema Neg Hx   . Atopy Neg Hx   . Immunodeficiency Neg Hx   . Urticaria Neg Hx      Social History Ms. Klingel reports that she quit smoking about 32 years ago. Her smoking use included cigarettes. She has a 20.00 pack-year smoking history. She has never used smokeless tobacco. Ms. Castillo reports no history of alcohol use.   Review of Systems CONSTITUTIONAL: No weight loss, fever, chills, weakness or fatigue.  HEENT: Eyes: No visual loss, blurred vision, double vision or yellow sclerae.No hearing loss, sneezing, congestion, runny nose or sore throat.  SKIN: No rash or itching.  CARDIOVASCULAR: per hpi RESPIRATORY:pe rhpi GASTROINTESTINAL: No anorexia, nausea, vomiting or diarrhea. No abdominal pain or blood.  GENITOURINARY: No burning on urination, no polyuria NEUROLOGICAL: No headache, dizziness, syncope, paralysis, ataxia, numbness or tingling in the extremities. No change in bowel or bladder control.  MUSCULOSKELETAL: No muscle, back  pain, joint pain or stiffness.  LYMPHATICS: No enlarged nodes. No history of splenectomy.  PSYCHIATRIC: No history of depression or anxiety.  ENDOCRINOLOGIC: No reports of sweating, cold or heat intolerance. No polyuria or polydipsia.  Marland Kitchen   Physical Examination Today's Vitals   01/25/20 1140  BP: 106/86  Pulse: 60  Weight: 125 lb 9.6 oz (57 kg)  Height: 5\' 5"  (1.651 m)   Body mass index is 20.9 kg/m.  Gen: resting comfortably, no acute distress HEENT: no scleral icterus, pupils equal round and reactive, no palptable cervical adenopathy,  CV: RRR, no m/r/g, no jvd Resp: Clear to auscultation bilaterally GI: abdomen is soft, non-tender, non-distended, normal bowel sounds, no hepatosplenomegaly MSK: extremities are warm, trace bilateral edema Skin: warm, no rash Neuro:  no focal deficits Psych: appropriate affect   Diagnostic Studies 11/2018 echo IMPRESSIONS   1. The left ventricle has mild-moderately reduced systolic function of 28-36%. The cavity size was normal. There is concentric left ventricular hypertrophy. Left ventricular diastology could not be evaluated secondary to atrial fibrillation. Elevated  left ventricular end-diastolic pressure Left ventricular diffuse hypokinesis. 2. The right ventricle has moderately reduced systolic function. The cavity was mildly enlarged. There is no increase in right ventricular wall thickness. 3. Left atrial size was mildly dilated. 4. Right atrial size was moderately dilated. 5. Moderate pleural effusion in the left lateral region. 6. The mitral valve is myxomatous. There is mild thickening. Mitral valve regurgitation is moderate by color flow Doppler. 7. The tricuspid valve is normal in structure. 8. The aortic valve is tricuspid Aortic valve regurgitation is mild by color flow Doppler. 9. There is dilatation of the aortic root. 10. The inferior vena cava was dilated in size with >50% respiratory  variability.    Assessment and Plan  1.Afib with Tachy-brady syndrome S/p av nodal ablation with pacemaker placement -no symptoms, continue current meds   2. Chronic systolic HF - some recent SOB/DOE. Has had some limited compliance with her diuretic at home, have encouraged to take more regularly  and monitor symptoms      Arnoldo Lenis, M.D.

## 2020-01-29 ENCOUNTER — Telehealth: Payer: Self-pay | Admitting: Internal Medicine

## 2020-01-29 NOTE — Telephone Encounter (Signed)
I spoke with patient. She reports for the last 2 days the area about 1-2 inches above her pacemaker has been itching. No rash but she does have very small "whitehead" in the area. Area is dry, no drainage and does not appear to be infected. No rash or itching anywhere else on her body. Pacemaker placed in April 2020.  Has not noted itching prior to last couple of days She is going to try and send a picture of area through my chart but is not sure she will be able to send as she has not done this before.

## 2020-01-29 NOTE — Telephone Encounter (Signed)
Patient states she has itching on her shoulder and neck by her pacemaker. She is concerned it may be the leads from her pacemaker. She states it is not spreading and she is not having any other symptoms. Please advise.

## 2020-01-30 NOTE — Telephone Encounter (Signed)
Attempted to reach patient to discuss photograph she sent it.  We cannot see her scar from PM incision on the picture she sent.  If the whitehead she is referring to is not over the incision then she should contact PCP for eval as it is not necessarily related to PM.  No answer at pt home number.  Left message with DC phone number for pt to return call.

## 2020-01-31 ENCOUNTER — Ambulatory Visit (HOSPITAL_COMMUNITY): Payer: Medicare Other | Admitting: Physical Therapy

## 2020-01-31 ENCOUNTER — Other Ambulatory Visit: Payer: Self-pay

## 2020-01-31 ENCOUNTER — Encounter (HOSPITAL_COMMUNITY): Payer: Self-pay | Admitting: Physical Therapy

## 2020-01-31 DIAGNOSIS — R262 Difficulty in walking, not elsewhere classified: Secondary | ICD-10-CM | POA: Diagnosis not present

## 2020-01-31 DIAGNOSIS — G8929 Other chronic pain: Secondary | ICD-10-CM

## 2020-01-31 DIAGNOSIS — M25562 Pain in left knee: Secondary | ICD-10-CM

## 2020-01-31 DIAGNOSIS — M6281 Muscle weakness (generalized): Secondary | ICD-10-CM

## 2020-01-31 NOTE — Telephone Encounter (Signed)
Pt returning Amy phone call.

## 2020-01-31 NOTE — Telephone Encounter (Signed)
Called patient to discuss previous note from A. Berline Lopes, RN with patient. Patient reports of improvement of bumps. Advised patient to keep area washed with soap and water, clean and dry. Patient advised to f/u with PCP if continues or worsens. Patient verbalizes understanding.

## 2020-01-31 NOTE — Therapy (Signed)
[facility] [place], [place], [address] Phone: [phone]   Fax:  [phone]  Physical Therapy Treatment  Patient Details  Name: Carol Hardin MRN: [id number] Date of Birth: October 21, 1932 Referring Provider (PT): Geraldo Pitter, NP   Encounter Date: 01/31/2020  PT End of Session - 01/31/20 1407    Visit Number  2    Number of Visits  12    Date for PT Re-Evaluation  03/05/20   POC dates 01/23/20 to 03/05/20   Authorization Type  [employer] Medicare. No auth, No VL, Co- insurance 10%    Progress Note Due on Visit  10    PT Start Time  1410    PT Stop Time  1442   pt late to session   PT Time Calculation (min)  32 min    Equipment Utilized During Treatment  Gait belt    Activity Tolerance  Patient tolerated treatment well    Behavior During Therapy  WFL for tasks assessed/performed       Past Medical History:  Diagnosis Date  . Anxiety   . Arthritis    "fingers, right toe" (08/09/2016)  . Basal cell carcinoma of lower leg, right   . Bradycardia    a. Holter 08/30/16 showed profound bradycardia down to 30 during awake hours, several 2 second pauses, very frequent PVCs >8000 in 48 hours, and NSVT (longest of 14 beats).  . Cardiomyopathy ([place])    a. EF 40% by echo 08/2016.  . Daily headache    "I usually wake up w/a headache; sometimes it's a migraine" (08/09/2016)  . Depression   . Diverticulosis    Hx. of  . Frequent PVCs   . GERD (gastroesophageal reflux disease)   . Hypercholesterolemia   . Migraine   . Mitral regurgitation   . MVP (mitral valve prolapse)   . NSVT (nonsustained ventricular tachycardia) ([place])    a. first noted event monitor 08/2016.  . Osteoporosis   . PAF (paroxysmal atrial fibrillation) ([employer])    a. in afib at time of echo 08/2016.  Marland Kitchen Pneumonia   . Scoliosis    mild  . Squamous cell carcinoma of neck     Past Surgical History:  Procedure Laterality Date  . AV NODE ABLATION N/A  01/23/2019   Procedure: AV NODE ABLATION;  Surgeon: Thompson Grayer, MD;  Location: [facility];  Service: Cardiovascular;  Laterality: N/A;  . BASAL CELL CARCINOMA EXCISION Right    RLE  . BIV PACEMAKER INSERTION CRT-P N/A 01/23/2019   Procedure: BIV PACEMAKER INSERTION CRT-P;  Surgeon: Thompson Grayer, MD;  Location: [facility];  Service: Cardiovascular;  Laterality: N/A;  . BLEPHAROPLASTY    . BREAST BIOPSY Left   . BREAST CYST ASPIRATION Left   . BREAST CYST EXCISION Left   . BUNIONECTOMY Bilateral   . DILATION AND CURETTAGE OF UTERUS    . EXCISIONAL HEMORRHOIDECTOMY    . INTRAMEDULLARY (IM) NAIL INTERTROCHANTERIC Left 12/31/2017   Procedure: INTRAMEDULLARY (IM) NAIL INTERTROCHANTRIC;  Surgeon: Nicholes Stairs, MD;  Location: [facility];  Service: Orthopedics;  Laterality: Left;  . KNEE ARTHROSCOPY Right 04/12/2007  . KNEE ARTHROSCOPY Left   . SQUAMOUS CELL CARCINOMA EXCISION     "neck"  . TONSILLECTOMY      There were no vitals filed for this visit.  Subjective Assessment - 01/31/20 1407    Subjective  States when she stands for too long she gets a tingling in  her back. States she is not in any pain and her knee hasn't been bothering her. States that she just saw her MD and he got on her about her laxis schedule and she is supposed to take it Tuesday, Thursday and Sunday and she did not take the pill today because she was concerned about not finding a bathroom when out. States she only monitors her BP and oxygen level when she is not feeling well.    Pertinent History  CHF, Shortness of breath, hosteoporosis, depression    Limitations  Walking;Standing;House hold activities    Patient Stated Goals  to be able to walk around the block    Currently in Pain?  No/denies         [facility] PT Assessment - 01/31/20 0001      Assessment   Medical Diagnosis  Deconditioning    Referring Provider (PT)  Geraldo Pitter, NP                   [facility] Adult PT  Treatment/Exercise - 01/31/20 0001      Exercises   Exercises  Knee/Hip      Knee/Hip Exercises: Standing   Other Standing Knee Exercises  lateral stepping at mat table 4x5 B - long rest breaks             PT Education - 01/31/20 1432    Education Details  Educated patient on importance of compliance with laxis secondary to decreasing fluid load on hear and importance of that. Educated patient that she can use depends and/or pads if needed. Discussed ensure alive (MD wants her to have 2 a day), educated patient on drinking those in addition to her meals not as meal replacements (which she had been doing).    Person(s) Educated  Patient    Methods  Explanation    Comprehension  Verbalized understanding       PT Short Term Goals - 01/23/20 1524      PT SHORT TERM GOAL #1   Title  Patient will be independent in self management strategies to improve quality of life and functional outcomes.    Time  3    Period  Weeks    Status  New    Target Date  02/13/20      PT SHORT TERM GOAL #2   Title  Patient will report at least 25% improvement in overall symptoms and/or functional ability.    Time  3    Period  Weeks    Status  New    Target Date  02/13/20        PT Long Term Goals - 01/23/20 1524      PT LONG TERM GOAL #1   Title  Patient will report at least 50% improvement in overall symptoms and/or functional ability.    Time  6    Period  Weeks    Status  New    Target Date  03/05/20      PT LONG TERM GOAL #2   Title  Patient will be able to walk at least 5 minutes without sititng down to rest to improve ability to walk around neighborhood.    Time  6    Period  Weeks    Status  New    Target Date  03/05/20      PT LONG TERM GOAL #3   Title  Patient will be able to stand on either leg for at least 30 seconds to demonstrate improved  static balance.    Time  6    Period  Weeks    Status  New    Target Date  03/05/20            Plan - 01/31/20 1407     Clinical Impression Statement  Focused on education today secondary to patient being non-compliant with laxis despite resent MD visit with MD instructing patient on importance of compliance. Educated patient on why she needs to take medication as prescribed and how it can affect PT treatment. Educated patient on supplementation of calories with ensure active live (recommended by MD). Patient verbalized understanding and will follow up with adherence to educated materials next session.    Personal Factors and Comorbidities  Age;Comorbidity 1;Comorbidity 2;Comorbidity 3+    Comorbidities  pacemaker, afib, diverticulitis, recent weight loss, stroke, shortness of breath    Examination-Activity Limitations  Bathing;Bend;Stand;Stairs;Squat    Examination-Participation Restrictions  Cleaning;Community Activity;Laundry;Meal Prep    Stability/Clinical Decision Making  Evolving/Moderate complexity    Rehab Potential  Good    PT Frequency  2x / week    PT Duration  6 weeks    PT Treatment/Interventions  ADLs/Self Care Home Management;Aquatic Therapy;Cryotherapy;Electrical Stimulation;DME Instruction;Traction;Moist Heat;Gait training;Stair training;Functional mobility training;Therapeutic activities;Therapeutic exercise;Balance training;Patient/family education;Neuromuscular re-education;Dry needling;Energy conservation;Passive range of motion    PT Next Visit Plan  f/u wiht laxis adherence, monitor BP, standing balance and strengthening    PT Home Exercise Plan  4/22 lateral stepping    Consulted and Agree with Plan of Care  Patient       Patient will benefit from skilled therapeutic intervention in order to improve the following deficits and impairments:  Decreased activity tolerance, Decreased balance, Decreased mobility, Decreased strength, Increased edema, Pain, Decreased endurance, Abnormal gait, Decreased range of motion, Difficulty walking  Visit Diagnosis: Difficulty in walking, not elsewhere  classified  Muscle weakness (generalized)  Chronic pain of left knee     Problem List Patient Active Problem List   Diagnosis Date Noted  . Muscular deconditioning 01/07/2020  . Dysphagia 03/30/2019  . Constipation 03/30/2019  . Pacemaker   . Chronic respiratory failure with hypoxia ([place])   . On home O2   . Sick sinus syndrome ([place]) 01/22/2019  . CKD (chronic kidney disease) stage 3, GFR 30-59 ml/min 01/21/2019  . Atrial fibrillation with RVR ([place]) 01/20/2019  . Acute on chronic systolic CHF (congestive heart failure) ([place]) 12/25/2018  . Tachy-brady syndrome ([place]) 12/25/2018  . Atrial fibrillation, chronic ([place]) 12/24/2018  . Congestive heart failure with left ventricular diastolic dysfunction, unspecified failure chronicity ([place]) 12/24/2018  . Malnutrition of moderate degree 11/20/2018  . Atrial fibrillation with rapid ventricular response ([place]) 11/18/2018  . GERD (gastroesophageal reflux disease) 11/18/2018  . CAP (community acquired pneumonia) 11/18/2018  . Acute on chronic diastolic HF (heart failure) ([place]) 11/18/2018  . Stroke (cerebrum) ([place]) 05/19/2018  . Hip fracture ([place]) 12/31/2017  . PAF (paroxysmal atrial fibrillation) ([place])   . NSVT (nonsustained ventricular tachycardia) ([place])   . Mitral regurgitation   . Frequent PVCs   . Bradycardia   . Essential hypertension   . Hypokalemia   . Diverticulitis 08/09/2016  . Diverticulosis of colon 04/22/2016  . Diverticulosis of large intestine without perforation or abscess without bleeding 04/22/2016  . Generalized anxiety disorder 04/22/2016  . Irritable bowel syndrome without diarrhea 04/22/2016  . Mitral valve prolapse 04/22/2016  . Mixed hyperlipidemia 04/22/2016  . Osteopenia 04/22/2016  . Low blood pressure 09/24/2014  . Hypercholesterolemia   . History  of depression   . Scoliosis   . Osteoporosis   . DOE (dyspnea on exertion) 05/31/2008   3:58 PM, 01/31/20 Jerene Pitch, DPT Physical Therapy with  [facility]  [phone] office  [facility] [address] [place], [place], [address] Phone: [phone]   Fax:  [phone]  Name: Carol Hardin MRN: [id number] Date of Birth: 06/16/1933

## 2020-02-05 ENCOUNTER — Telehealth: Payer: Self-pay | Admitting: Emergency Medicine

## 2020-02-05 NOTE — Telephone Encounter (Signed)
Patient reports "white pimple" at incision site of  Device. Reports area itches , no fever, no chills, no drainage from area. Will schedule patient to be seen in device clinic. Dr Rayann Heman viewed myChart message and recommends plan of action listed above.

## 2020-02-06 ENCOUNTER — Telehealth (HOSPITAL_COMMUNITY): Payer: Self-pay

## 2020-02-06 NOTE — Telephone Encounter (Signed)
Patient is scheduled for site check on 02/07/20 in [facility].

## 2020-02-06 NOTE — Telephone Encounter (Signed)
pt cancelled appt for 4/29 because she has to go have a pacemaker check

## 2020-02-07 ENCOUNTER — Ambulatory Visit (HOSPITAL_COMMUNITY): Payer: Medicare Other

## 2020-02-07 ENCOUNTER — Ambulatory Visit: Payer: Medicare Other | Admitting: *Deleted

## 2020-02-07 ENCOUNTER — Other Ambulatory Visit: Payer: Self-pay

## 2020-02-07 DIAGNOSIS — Z950 Presence of cardiac pacemaker: Secondary | ICD-10-CM

## 2020-02-07 DIAGNOSIS — I495 Sick sinus syndrome: Secondary | ICD-10-CM

## 2020-02-07 NOTE — Progress Notes (Signed)
Pacemaker site assessed. Pacemaker site without edema, redness or drainage. No fever or chills and incision site closed. Patient reports itching and has a 1 mm X 1 mm pink area superior to left clavicle. Patient has similar pink areas on BUE.  Patient reassured that pacemaker site appears to have no issues and encouraged to follow up with PCP concerning itching and skin issues.

## 2020-02-07 NOTE — Patient Instructions (Signed)
Follow up with family doctor concerning itching and red areas on your arms and shoulder.

## 2020-02-11 ENCOUNTER — Ambulatory Visit (INDEPENDENT_AMBULATORY_CARE_PROVIDER_SITE_OTHER): Payer: Medicare Other | Admitting: *Deleted

## 2020-02-11 DIAGNOSIS — I5023 Acute on chronic systolic (congestive) heart failure: Secondary | ICD-10-CM

## 2020-02-11 DIAGNOSIS — I495 Sick sinus syndrome: Secondary | ICD-10-CM

## 2020-02-11 LAB — CUP PACEART REMOTE DEVICE CHECK
Battery Remaining Longevity: 74 mo
Battery Remaining Percentage: 95.5 %
Battery Voltage: 2.99 V
Brady Statistic AP VP Percent: 0 %
Brady Statistic AP VS Percent: 0 %
Brady Statistic AS VP Percent: 100 %
Brady Statistic AS VS Percent: 0 %
Brady Statistic RA Percent Paced: 1 %
Date Time Interrogation Session: 20210503021331
Implantable Lead Implant Date: 20200414
Implantable Lead Implant Date: 20200414
Implantable Lead Implant Date: 20200414
Implantable Lead Location: 753858
Implantable Lead Location: 753859
Implantable Lead Location: 753860
Implantable Pulse Generator Implant Date: 20200414
Lead Channel Impedance Value: 460 Ohm
Lead Channel Impedance Value: 460 Ohm
Lead Channel Impedance Value: 840 Ohm
Lead Channel Pacing Threshold Amplitude: 0.5 V
Lead Channel Pacing Threshold Amplitude: 1.5 V
Lead Channel Pacing Threshold Pulse Width: 0.5 ms
Lead Channel Pacing Threshold Pulse Width: 0.7 ms
Lead Channel Sensing Intrinsic Amplitude: 1.7 mV
Lead Channel Sensing Intrinsic Amplitude: 4.6 mV
Lead Channel Setting Pacing Amplitude: 2.5 V
Lead Channel Setting Pacing Amplitude: 2.5 V
Lead Channel Setting Pacing Amplitude: 3.5 V
Lead Channel Setting Pacing Pulse Width: 0.5 ms
Lead Channel Setting Pacing Pulse Width: 0.7 ms
Lead Channel Setting Sensing Sensitivity: 3 mV
Pulse Gen Model: 3562
Pulse Gen Serial Number: 9431558

## 2020-02-11 NOTE — Progress Notes (Signed)
Remote pacemaker transmission.   

## 2020-02-14 ENCOUNTER — Other Ambulatory Visit: Payer: Self-pay

## 2020-02-14 ENCOUNTER — Ambulatory Visit (HOSPITAL_COMMUNITY): Payer: Medicare Other | Attending: Primary Care | Admitting: Physical Therapy

## 2020-02-14 DIAGNOSIS — G8929 Other chronic pain: Secondary | ICD-10-CM | POA: Insufficient documentation

## 2020-02-14 DIAGNOSIS — M25562 Pain in left knee: Secondary | ICD-10-CM

## 2020-02-14 DIAGNOSIS — R262 Difficulty in walking, not elsewhere classified: Secondary | ICD-10-CM | POA: Diagnosis not present

## 2020-02-14 DIAGNOSIS — M6281 Muscle weakness (generalized): Secondary | ICD-10-CM | POA: Insufficient documentation

## 2020-02-14 NOTE — Therapy (Signed)
[place], [place], [address] Phone: [phone]   Fax:  [phone]  Physical Therapy Treatment  Patient Details  Name: Carol Hardin MRN: [id number] Date of Birth: May 16, 1933 Referring Provider (PT): Geraldo Pitter, NP   Encounter Date: 02/14/2020  PT End of Session - 02/14/20 1409    Visit Number  3    Number of Visits  12    Date for PT Re-Evaluation  03/05/20   POC dates 01/23/20 to 03/05/20   Authorization Type  [employer]HC Medicare. No auth, No VL, Co- insurance 10%    Progress Note Due on Visit  10    PT Start Time  1409    PT Stop Time  1444    PT Time Calculation (min)  35 min    Equipment Utilized During Treatment  Gait belt    Activity Tolerance  Patient tolerated treatment well    Behavior During Therapy  WFL for tasks assessed/performed       Past Medical History:  Diagnosis Date  . Anxiety   . Arthritis    "fingers, right toe" (08/09/2016)  . Basal cell carcinoma of lower leg, right   . Bradycardia    a. Holter 08/30/16 showed profound bradycardia down to 30 during awake hours, several 2 second pauses, very frequent PVCs >8000 in 48 hours, and NSVT (longest of 14 beats).  . Cardiomyopathy ([place])    a. EF 40% by echo 08/2016.  . Daily headache    "I usually wake up w/a headache; sometimes it's a migraine" (08/09/2016)  . Depression   . Diverticulosis    Hx. of  . Frequent PVCs   . GERD (gastroesophageal reflux disease)   . Hypercholesterolemia   . Migraine   . Mitral regurgitation   . MVP (mitral valve prolapse)   . NSVT (nonsustained ventricular tachycardia) ([place])    a. first noted event monitor 08/2016.  . Osteoporosis   . PAF (paroxysmal atrial fibrillation) ([place])    a. in afib at time of echo 08/2016.  Marland Kitchen Pneumonia   . Scoliosis    mild  . Squamous cell carcinoma of neck     Past Surgical History:  Procedure Laterality Date  . AV NODE ABLATION N/A 01/23/2019   Procedure: AV  NODE ABLATION;  Surgeon: Thompson Grayer, MD;  Location: [facility];  Service: Cardiovascular;  Laterality: N/A;  . BASAL CELL CARCINOMA EXCISION Right    RLE  . BIV PACEMAKER INSERTION CRT-P N/A 01/23/2019   Procedure: BIV PACEMAKER INSERTION CRT-P;  Surgeon: Thompson Grayer, MD;  Location: [facility];  Service: Cardiovascular;  Laterality: N/A;  . BLEPHAROPLASTY    . BREAST BIOPSY Left   . BREAST CYST ASPIRATION Left   . BREAST CYST EXCISION Left   . BUNIONECTOMY Bilateral   . DILATION AND CURETTAGE OF UTERUS    . EXCISIONAL HEMORRHOIDECTOMY    . INTRAMEDULLARY (IM) NAIL INTERTROCHANTERIC Left 12/31/2017   Procedure: INTRAMEDULLARY (IM) NAIL INTERTROCHANTRIC;  Surgeon: Nicholes Stairs, MD;  Location: [facility];  Service: Orthopedics;  Laterality: Left;  . KNEE ARTHROSCOPY Right 04/12/2007  . KNEE ARTHROSCOPY Left   . SQUAMOUS CELL CARCINOMA EXCISION     "neck"  . TONSILLECTOMY      There were no vitals filed for this visit.  Subjective Assessment - 02/14/20 1413    Subjective  States her and her husband started working in the yard yesterday and she is just a bit  off. Reports no pain. States she had an upset stomach yesterday. Reports she surprised herself with the spreading part of the grass seed without difficulty. States she has been doing her exercises.    Pertinent History  CHF, Shortness of breath, hosteoporosis, depression    Limitations  Walking;Standing;House hold activities    Patient Stated Goals  to be able to walk around the block    Currently in Pain?  No/denies         [facility] PT Assessment - 02/14/20 0001      Assessment   Medical Diagnosis  Deconditioning    Referring Provider (PT)  Geraldo Pitter, NP                   [facility] Adult PT Treatment/Exercise - 02/14/20 0001      Knee/Hip Exercises: Standing   Hip Abduction  AROM;Stengthening;Both;3 sets;10 reps;Knee straight   hands on table   Hip Extension  AROM;Stengthening;Both;3  sets;10 reps;Knee straight    Other Standing Knee Exercises  hamstring curls with UE assist 2x15 B       Knee/Hip Exercises: Seated   Long Arc Quad  AROM;Strengthening;Both;3 sets;10 reps    Sit to [employer]  10 reps;without UE support   x3 sets              PT Short Term Goals - 01/23/20 1524      PT SHORT TERM GOAL #1   Title  Patient will be independent in self management strategies to improve quality of life and functional outcomes.    Time  3    Period  Weeks    Status  New    Target Date  02/13/20      PT SHORT TERM GOAL #2   Title  Patient will report at least 25% improvement in overall symptoms and/or functional ability.    Time  3    Period  Weeks    Status  New    Target Date  02/13/20        PT Long Term Goals - 01/23/20 1524      PT LONG TERM GOAL #1   Title  Patient will report at least 50% improvement in overall symptoms and/or functional ability.    Time  6    Period  Weeks    Status  New    Target Date  03/05/20      PT LONG TERM GOAL #2   Title  Patient will be able to walk at least 5 minutes without sititng down to rest to improve ability to walk around neighborhood.    Time  6    Period  Weeks    Status  New    Target Date  03/05/20      PT LONG TERM GOAL #3   Title  Patient will be able to stand on either leg for at least 30 seconds to demonstrate improved static balance.    Time  6    Period  Weeks    Status  New    Target Date  03/05/20            Plan - 02/14/20 1409    Clinical Impression Statement  Stomach discomfort limited patient's ability to participate in therapy today. Took longer rest breaks to allow for stomach to settle between sets. Able to perform more exercises today. Added new standing exercises to HEP.    Personal Factors and Comorbidities  Age;Comorbidity 1;Comorbidity 2;Comorbidity 3+    Comorbidities  pacemaker, afib, diverticulitis, recent weight loss, stroke, shortness of breath    Examination-Activity  Limitations  Bathing;Bend;Stand;Stairs;Squat    Examination-Participation Restrictions  Cleaning;Community Activity;Laundry;Meal Prep    Stability/Clinical Decision Making  Evolving/Moderate complexity    Rehab Potential  Good    PT Frequency  2x / week    PT Duration  6 weeks    PT Treatment/Interventions  ADLs/Self Care Home Management;Aquatic Therapy;Cryotherapy;Electrical Stimulation;DME Instruction;Traction;Moist Heat;Gait training;Stair training;Functional mobility training;Therapeutic activities;Therapeutic exercise;Balance training;Patient/family education;Neuromuscular re-education;Dry needling;Energy conservation;Passive range of motion    PT Next Visit Plan  monitor BP, standing balance and strengthening    PT Home Exercise Plan  4/22 lateral stepping; 5/6 hip ext, abd. LAQs    Consulted and Agree with Plan of Care  Patient       Patient will benefit from skilled therapeutic intervention in order to improve the following deficits and impairments:  Decreased activity tolerance, Decreased balance, Decreased mobility, Decreased strength, Increased edema, Pain, Decreased endurance, Abnormal gait, Decreased range of motion, Difficulty walking  Visit Diagnosis: Difficulty in walking, not elsewhere classified  Muscle weakness (generalized)  Chronic pain of left knee     Problem List Patient Active Problem List   Diagnosis Date Noted  . Muscular deconditioning 01/07/2020  . Dysphagia 03/30/2019  . Constipation 03/30/2019  . Pacemaker   . Chronic respiratory failure with hypoxia ([place])   . On home O2   . Sick sinus syndrome ([place]) 01/22/2019  . CKD (chronic kidney disease) stage 3, GFR 30-59 ml/min 01/21/2019  . Atrial fibrillation with RVR ([place]) 01/20/2019  . Acute on chronic systolic CHF (congestive heart failure) ([place]) 12/25/2018  . Tachy-brady syndrome ([place]) 12/25/2018  . Atrial fibrillation, chronic ([place]) 12/24/2018  . Congestive heart failure with left ventricular  diastolic dysfunction, unspecified failure chronicity ([place]) 12/24/2018  . Malnutrition of moderate degree 11/20/2018  . Atrial fibrillation with rapid ventricular response ([place]) 11/18/2018  . GERD (gastroesophageal reflux disease) 11/18/2018  . CAP (community acquired pneumonia) 11/18/2018  . Acute on chronic diastolic HF (heart failure) ([place]) 11/18/2018  . Stroke (cerebrum) ([place]) 05/19/2018  . Hip fracture ([place]) 12/31/2017  . PAF (paroxysmal atrial fibrillation) ([place])   . NSVT (nonsustained ventricular tachycardia) ([place])   . Mitral regurgitation   . Frequent PVCs   . Bradycardia   . Essential hypertension   . Hypokalemia   . Diverticulitis 08/09/2016  . Diverticulosis of colon 04/22/2016  . Diverticulosis of large intestine without perforation or abscess without bleeding 04/22/2016  . Generalized anxiety disorder 04/22/2016  . Irritable bowel syndrome without diarrhea 04/22/2016  . Mitral valve prolapse 04/22/2016  . Mixed hyperlipidemia 04/22/2016  . Osteopenia 04/22/2016  . Low blood pressure 09/24/2014  . Hypercholesterolemia   . History of depression   . Scoliosis   . Osteoporosis   . DOE (dyspnea on exertion) 05/31/2008    2:42 PM, 02/14/20 Jerene Pitch, DPT Physical Therapy with [facility]  [phone] office  [place] [address] [place], [place], [address] Phone: [phone]   Fax:  [phone]  Name: MATTIA OSTERMAN MRN: [id number] Date of Birth: February 08, 1933

## 2020-02-21 ENCOUNTER — Other Ambulatory Visit: Payer: Self-pay

## 2020-02-21 ENCOUNTER — Ambulatory Visit (HOSPITAL_COMMUNITY): Payer: Medicare Other | Admitting: Physical Therapy

## 2020-02-21 ENCOUNTER — Encounter (HOSPITAL_COMMUNITY): Payer: Self-pay | Admitting: Physical Therapy

## 2020-02-21 DIAGNOSIS — G8929 Other chronic pain: Secondary | ICD-10-CM

## 2020-02-21 DIAGNOSIS — M6281 Muscle weakness (generalized): Secondary | ICD-10-CM

## 2020-02-21 DIAGNOSIS — R262 Difficulty in walking, not elsewhere classified: Secondary | ICD-10-CM

## 2020-02-21 DIAGNOSIS — M25562 Pain in left knee: Secondary | ICD-10-CM

## 2020-02-21 NOTE — Therapy (Signed)
[place], [place], [address] Phone: [phone]   Fax:  [phone]  Physical Therapy Treatment  Patient Details  Name: Carol Hardin MRN: [id number] Date of Birth: Sep 09, 1933 Referring Provider (PT): Geraldo Pitter, NP   Encounter Date: 02/21/2020  PT End of Session - 02/21/20 1404    Visit Number  4    Number of Visits  12    Date for PT Re-Evaluation  03/05/20   POC dates 01/23/20 to 03/05/20   Authorization Type  [employer]HC Medicare. No auth, No VL, Co- insurance 10%    Progress Note Due on Visit  10    PT Start Time  1404    PT Stop Time  1444    PT Time Calculation (min)  40 min    Equipment Utilized During Treatment  Gait belt    Activity Tolerance  Patient tolerated treatment well    Behavior During Therapy  WFL for tasks assessed/performed       Past Medical History:  Diagnosis Date  . Anxiety   . Arthritis    "fingers, right toe" (08/09/2016)  . Basal cell carcinoma of lower leg, right   . Bradycardia    a. Holter 08/30/16 showed profound bradycardia down to 30 during awake hours, several 2 second pauses, very frequent PVCs >8000 in 48 hours, and NSVT (longest of 14 beats).  . Cardiomyopathy ([place])    a. EF 40% by echo 08/2016.  . Daily headache    "I usually wake up w/a headache; sometimes it's a migraine" (08/09/2016)  . Depression   . Diverticulosis    Hx. of  . Frequent PVCs   . GERD (gastroesophageal reflux disease)   . Hypercholesterolemia   . Migraine   . Mitral regurgitation   . MVP (mitral valve prolapse)   . NSVT (nonsustained ventricular tachycardia) ([place])    a. first noted event monitor 08/2016.  . Osteoporosis   . PAF (paroxysmal atrial fibrillation) ([place])    a. in afib at time of echo 08/2016.  Marland Kitchen Pneumonia   . Scoliosis    mild  . Squamous cell carcinoma of neck     Past Surgical History:  Procedure Laterality Date  . AV NODE ABLATION N/A 01/23/2019   Procedure: AV  NODE ABLATION;  Surgeon: Thompson Grayer, MD;  Location: [facility];  Service: Cardiovascular;  Laterality: N/A;  . BASAL CELL CARCINOMA EXCISION Right    RLE  . BIV PACEMAKER INSERTION CRT-P N/A 01/23/2019   Procedure: BIV PACEMAKER INSERTION CRT-P;  Surgeon: Thompson Grayer, MD;  Location: [facility];  Service: Cardiovascular;  Laterality: N/A;  . BLEPHAROPLASTY    . BREAST BIOPSY Left   . BREAST CYST ASPIRATION Left   . BREAST CYST EXCISION Left   . BUNIONECTOMY Bilateral   . DILATION AND CURETTAGE OF UTERUS    . EXCISIONAL HEMORRHOIDECTOMY    . INTRAMEDULLARY (IM) NAIL INTERTROCHANTERIC Left 12/31/2017   Procedure: INTRAMEDULLARY (IM) NAIL INTERTROCHANTRIC;  Surgeon: Nicholes Stairs, MD;  Location: [facility];  Service: Orthopedics;  Laterality: Left;  . KNEE ARTHROSCOPY Right 04/12/2007  . KNEE ARTHROSCOPY Left   . SQUAMOUS CELL CARCINOMA EXCISION     "neck"  . TONSILLECTOMY      There were no vitals filed for this visit.  Subjective Assessment - 02/21/20 1410    Subjective  States she has been really busy with mother's day. States she has been doing stuff in the  yard and that she bought a lot of groceries and she had to bring them back into her house. States if she tries to overdo things she starts getting a back ache and it feels worse.    Pertinent History  CHF, Shortness of breath, hosteoporosis, depression    Limitations  Walking;Standing;House hold activities    Patient Stated Goals  to be able to walk around the block    Currently in Pain?  No/denies         [facility] PT Assessment - 02/21/20 0001      Assessment   Medical Diagnosis  Deconditioning    Referring Provider (PT)  Geraldo Pitter, NP                    [facility] Adult PT Treatment/Exercise - 02/21/20 0001      Knee/Hip Exercises: Supine   Other Supine Knee/Hip Exercises  diaphragmatic breathing - education and how to perform - practiced 3x 3 minutes in clinic; pelvic tilts 3x15 PT  tactile and verbal cues    Other Supine Knee/Hip Exercises  TRA isometric with tactila nd verbal cues x15 5" holds              PT Education - 02/21/20 1415    Education Details  on anatomy and on how isometric exercise will help with back pain. on education of new exercises and how they will help    Person(s) Educated  Patient    Methods  Explanation    Comprehension  Verbalized understanding       PT Short Term Goals - 01/23/20 1524      PT SHORT TERM GOAL #1   Title  Patient will be independent in self management strategies to improve quality of life and functional outcomes.    Time  3    Period  Weeks    Status  New    Target Date  02/13/20      PT SHORT TERM GOAL #2   Title  Patient will report at least 25% improvement in overall symptoms and/or functional ability.    Time  3    Period  Weeks    Status  New    Target Date  02/13/20        PT Long Term Goals - 01/23/20 1524      PT LONG TERM GOAL #1   Title  Patient will report at least 50% improvement in overall symptoms and/or functional ability.    Time  6    Period  Weeks    Status  New    Target Date  03/05/20      PT LONG TERM GOAL #2   Title  Patient will be able to walk at least 5 minutes without sititng down to rest to improve ability to walk around neighborhood.    Time  6    Period  Weeks    Status  New    Target Date  03/05/20      PT LONG TERM GOAL #3   Title  Patient will be able to stand on either leg for at least 30 seconds to demonstrate improved static balance.    Time  6    Period  Weeks    Status  New    Target Date  03/05/20            Plan - 02/21/20 1404    Clinical Impression Statement  Focused on core activation and deep breathing exercises today.  Patient responded really well this this secondary to concerns about ability to breath deeply given everything that she has been through. Added these exercises to HEP. No pain reported during session and patient eager to add  new exercises to program.    Personal Factors and Comorbidities  Age;Comorbidity 1;Comorbidity 2;Comorbidity 3+    Comorbidities  pacemaker, afib, diverticulitis, recent weight loss, stroke, shortness of breath    Examination-Activity Limitations  Bathing;Bend;Stand;Stairs;Squat    Examination-Participation Restrictions  Cleaning;Community Activity;Laundry;Meal Prep    Stability/Clinical Decision Making  Evolving/Moderate complexity    Rehab Potential  Good    PT Frequency  2x / week    PT Duration  6 weeks    PT Treatment/Interventions  ADLs/Self Care Home Management;Aquatic Therapy;Cryotherapy;Electrical Stimulation;DME Instruction;Traction;Moist Heat;Gait training;Stair training;Functional mobility training;Therapeutic activities;Therapeutic exercise;Balance training;Patient/family education;Neuromuscular re-education;Dry needling;Energy conservation;Passive range of motion    PT Next Visit Plan  monitor BP, standing balance and strengthening    PT Home Exercise Plan  4/22 lateral stepping; 5/6 hip ext, abd. LAQs; 5/13 TRA activation, pelvic tilts supine, deep breathing.    Consulted and Agree with Plan of Care  Patient       Patient will benefit from skilled therapeutic intervention in order to improve the following deficits and impairments:  Decreased activity tolerance, Decreased balance, Decreased mobility, Decreased strength, Increased edema, Pain, Decreased endurance, Abnormal gait, Decreased range of motion, Difficulty walking  Visit Diagnosis: Difficulty in walking, not elsewhere classified  Muscle weakness (generalized)  Chronic pain of left knee     Problem List Patient Active Problem List   Diagnosis Date Noted  . Muscular deconditioning 01/07/2020  . Dysphagia 03/30/2019  . Constipation 03/30/2019  . Pacemaker   . Chronic respiratory failure with hypoxia ([place])   . On home O2   . Sick sinus syndrome ([place]) 01/22/2019  . CKD (chronic kidney disease) stage 3, GFR  30-59 ml/min 01/21/2019  . Atrial fibrillation with RVR ([place]) 01/20/2019  . Acute on chronic systolic CHF (congestive heart failure) ([place]) 12/25/2018  . Tachy-brady syndrome ([place]) 12/25/2018  . Atrial fibrillation, chronic ([place]) 12/24/2018  . Congestive heart failure with left ventricular diastolic dysfunction, unspecified failure chronicity ([place]) 12/24/2018  . Malnutrition of moderate degree 11/20/2018  . Atrial fibrillation with rapid ventricular response ([place]) 11/18/2018  . GERD (gastroesophageal reflux disease) 11/18/2018  . CAP (community acquired pneumonia) 11/18/2018  . Acute on chronic diastolic HF (heart failure) ([place]) 11/18/2018  . Stroke (cerebrum) ([place]) 05/19/2018  . Hip fracture ([place]) 12/31/2017  . PAF (paroxysmal atrial fibrillation) ([place])   . NSVT (nonsustained ventricular tachycardia) ([place])   . Mitral regurgitation   . Frequent PVCs   . Bradycardia   . Essential hypertension   . Hypokalemia   . Diverticulitis 08/09/2016  . Diverticulosis of colon 04/22/2016  . Diverticulosis of large intestine without perforation or abscess without bleeding 04/22/2016  . Generalized anxiety disorder 04/22/2016  . Irritable bowel syndrome without diarrhea 04/22/2016  . Mitral valve prolapse 04/22/2016  . Mixed hyperlipidemia 04/22/2016  . Osteopenia 04/22/2016  . Low blood pressure 09/24/2014  . Hypercholesterolemia   . History of depression   . Scoliosis   . Osteoporosis   . DOE (dyspnea on exertion) 05/31/2008   3:56 PM, 02/21/20 Jerene Pitch, DPT Physical Therapy with [facility]  [phone] office  [place] [address] [place], [place], [address] Phone: [phone]   Fax:  [phone]  Name: Carol Hardin MRN: [id number] Date of Birth:  05/06/1933   

## 2020-02-28 ENCOUNTER — Ambulatory Visit (HOSPITAL_COMMUNITY): Payer: Medicare Other | Admitting: Physical Therapy

## 2020-02-28 ENCOUNTER — Encounter (HOSPITAL_COMMUNITY): Payer: Self-pay | Admitting: Physical Therapy

## 2020-02-28 ENCOUNTER — Other Ambulatory Visit: Payer: Self-pay

## 2020-02-28 DIAGNOSIS — M6281 Muscle weakness (generalized): Secondary | ICD-10-CM

## 2020-02-28 DIAGNOSIS — G8929 Other chronic pain: Secondary | ICD-10-CM

## 2020-02-28 DIAGNOSIS — R262 Difficulty in walking, not elsewhere classified: Secondary | ICD-10-CM

## 2020-02-28 DIAGNOSIS — M25562 Pain in left knee: Secondary | ICD-10-CM

## 2020-02-28 NOTE — Therapy (Signed)
[place], [place], [address] Phone: [phone]   Fax:  [phone]  Physical Therapy Treatment  Patient Details  Name: Carol Hardin MRN: [id number] Date of Birth: [birth year] Referring Provider (PT): Geraldo Pitter, NP   Encounter Date: 02/28/2020  PT End of Session - 02/28/20 1409    Visit Number  5    Number of Visits  12    Date for PT Re-Evaluation  03/05/20   POC dates 01/23/20 to 03/05/20   Authorization Type  [employer] Medicare. No auth, No VL, Co- insurance 10%    Progress Note Due on Visit  10    PT Start Time  1409   pt 9 minutes late to session   PT Stop Time  1447    PT Time Calculation (min)  38 min    Equipment Utilized During Treatment  Gait belt    Activity Tolerance  Patient tolerated treatment well    Behavior During Therapy  WFL for tasks assessed/performed       Past Medical History:  Diagnosis Date  . Anxiety   . Arthritis    "fingers, right toe" (08/09/2016)  . Basal cell carcinoma of lower leg, right   . Bradycardia    a. Holter 08/30/16 showed profound bradycardia down to 30 during awake hours, several 2 second pauses, very frequent PVCs >8000 in 48 hours, and NSVT (longest of 14 beats).  . Cardiomyopathy ([place])    a. EF 40% by echo 08/2016.  . Daily headache    "I usually wake up w/a headache; sometimes it's a migraine" (08/09/2016)  . Depression   . Diverticulosis    Hx. of  . Frequent PVCs   . GERD (gastroesophageal reflux disease)   . Hypercholesterolemia   . Migraine   . Mitral regurgitation   . MVP (mitral valve prolapse)   . NSVT (nonsustained ventricular tachycardia) ([place])    a. first noted event monitor 08/2016.  . Osteoporosis   . PAF (paroxysmal atrial fibrillation) ([place])    a. in afib at time of echo 08/2016.  Marland Kitchen Pneumonia   . Scoliosis    mild  . Squamous cell carcinoma of neck     Past Surgical History:  Procedure Laterality Date  . AV NODE ABLATION  N/A 01/23/2019   Procedure: AV NODE ABLATION;  Surgeon: Thompson Grayer, MD;  Location: [facility];  Service: Cardiovascular;  Laterality: N/A;  . BASAL CELL CARCINOMA EXCISION Right    RLE  . BIV PACEMAKER INSERTION CRT-P N/A 01/23/2019   Procedure: BIV PACEMAKER INSERTION CRT-P;  Surgeon: Thompson Grayer, MD;  Location: [facility];  Service: Cardiovascular;  Laterality: N/A;  . BLEPHAROPLASTY    . BREAST BIOPSY Left   . BREAST CYST ASPIRATION Left   . BREAST CYST EXCISION Left   . BUNIONECTOMY Bilateral   . DILATION AND CURETTAGE OF UTERUS    . EXCISIONAL HEMORRHOIDECTOMY    . INTRAMEDULLARY (IM) NAIL INTERTROCHANTERIC Left 12/31/2017   Procedure: INTRAMEDULLARY (IM) NAIL INTERTROCHANTRIC;  Surgeon: Nicholes Stairs, MD;  Location: [facility];  Service: Orthopedics;  Laterality: Left;  . KNEE ARTHROSCOPY Right 04/12/2007  . KNEE ARTHROSCOPY Left   . SQUAMOUS CELL CARCINOMA EXCISION     "neck"  . TONSILLECTOMY      There were no vitals filed for this visit.  Subjective Assessment - 02/28/20 1458    Subjective  States her breathing exercises are helping. States she has been  doing some gardening and her back has really started bothering her. States it is scaring her because she can't stand too long because of the pain. Pain is tingling and painful. States she doesn't know if the spot on her back is the cause of her pain. Reports she still isn't eating much.    Pertinent History  CHF, Shortness of breath, hosteoporosis, depression    Limitations  Walking;Standing;House hold activities    Patient Stated Goals  to be able to walk around the block    Currently in Pain?  Yes    Pain Score  3     Pain Location  Back    Pain Orientation  Mid    Pain Descriptors / Indicators  Aching;Tingling                                PT Education - 02/28/20 1459    Education Details  Educated patient in proper nutrition/calories and different frozen items that are  low in salt but still nutrient dense. Educated patient in anatomy, posture and weight loss and how certain bones may be more prominent. Answered all questions and discussed following up with MD if she is concerned about her back (though imaging from feb of this year did not have any concerning information).    Person(s) Educated  Patient    Methods  Explanation    Comprehension  Verbalized understanding       PT Short Term Goals - 01/23/20 1524      PT SHORT TERM GOAL #1   Title  Patient will be independent in self management strategies to improve quality of life and functional outcomes.    Time  3    Period  Weeks    Status  New    Target Date  02/13/20      PT SHORT TERM GOAL #2   Title  Patient will report at least 25% improvement in overall symptoms and/or functional ability.    Time  3    Period  Weeks    Status  New    Target Date  02/13/20        PT Long Term Goals - 01/23/20 1524      PT LONG TERM GOAL #1   Title  Patient will report at least 50% improvement in overall symptoms and/or functional ability.    Time  6    Period  Weeks    Status  New    Target Date  03/05/20      PT LONG TERM GOAL #2   Title  Patient will be able to walk at least 5 minutes without sititng down to rest to improve ability to walk around neighborhood.    Time  6    Period  Weeks    Status  New    Target Date  03/05/20      PT LONG TERM GOAL #3   Title  Patient will be able to stand on either leg for at least 30 seconds to demonstrate improved static balance.    Time  6    Period  Weeks    Status  New    Target Date  03/05/20            Plan - 02/28/20 1410    Clinical Impression Statement  Session focused on education and answering all questions about proper nutrition, calorie dense food and importance of working on improving  the type/variety of food she intakes (mainly eating peanut butter and jelly sandwiches). Will follow up with nutrition concerns and back pain next  session. Will reassess patient next session to determine next step in plan of care.    Personal Factors and Comorbidities  Age;Comorbidity 1;Comorbidity 2;Comorbidity 3+    Comorbidities  pacemaker, afib, diverticulitis, recent weight loss, stroke, shortness of breath    Examination-Activity Limitations  Bathing;Bend;Stand;Stairs;Squat    Examination-Participation Restrictions  Cleaning;Community Activity;Laundry;Meal Prep    Stability/Clinical Decision Making  Evolving/Moderate complexity    Rehab Potential  Good    PT Frequency  2x / week    PT Duration  6 weeks    PT Treatment/Interventions  ADLs/Self Care Home Management;Aquatic Therapy;Cryotherapy;Electrical Stimulation;DME Instruction;Traction;Moist Heat;Gait training;Stair training;Functional mobility training;Therapeutic activities;Therapeutic exercise;Balance training;Patient/family education;Neuromuscular re-education;Dry needling;Energy conservation;Passive range of motion    PT Next Visit Plan  monitor BP, standing balance and strengthening    PT Home Exercise Plan  4/22 lateral stepping; 5/6 hip ext, abd. LAQs; 5/13 TRA activation, pelvic tilts supine, deep breathing.    Consulted and Agree with Plan of Care  Patient       Patient will benefit from skilled therapeutic intervention in order to improve the following deficits and impairments:  Decreased activity tolerance, Decreased balance, Decreased mobility, Decreased strength, Increased edema, Pain, Decreased endurance, Abnormal gait, Decreased range of motion, Difficulty walking  Visit Diagnosis: Difficulty in walking, not elsewhere classified  Muscle weakness (generalized)  Chronic pain of left knee     Problem List Patient Active Problem List   Diagnosis Date Noted  . Muscular deconditioning 01/07/2020  . Dysphagia 03/30/2019  . Constipation 03/30/2019  . Pacemaker   . Chronic respiratory failure with hypoxia ([place])   . On home O2   . Sick sinus syndrome ([place])  01/22/2019  . CKD (chronic kidney disease) stage 3, GFR 30-59 ml/min 01/21/2019  . Atrial fibrillation with RVR ([place]) 01/20/2019  . Acute on chronic systolic CHF (congestive heart failure) ([place]) 12/25/2018  . Tachy-brady syndrome ([place]) 12/25/2018  . Atrial fibrillation, chronic ([place]) 12/24/2018  . Congestive heart failure with left ventricular diastolic dysfunction, unspecified failure chronicity ([place]) 12/24/2018  . Malnutrition of moderate degree 11/20/2018  . Atrial fibrillation with rapid ventricular response ([place]) 11/18/2018  . GERD (gastroesophageal reflux disease) 11/18/2018  . CAP (community acquired pneumonia) 11/18/2018  . Acute on chronic diastolic HF (heart failure) ([place]) 11/18/2018  . Stroke (cerebrum) ([place]) 05/19/2018  . Hip fracture ([place]) 12/31/2017  . PAF (paroxysmal atrial fibrillation) ([place])   . NSVT (nonsustained ventricular tachycardia) ([place])   . Mitral regurgitation   . Frequent PVCs   . Bradycardia   . Essential hypertension   . Hypokalemia   . Diverticulitis 08/09/2016  . Diverticulosis of colon 04/22/2016  . Diverticulosis of large intestine without perforation or abscess without bleeding 04/22/2016  . Generalized anxiety disorder 04/22/2016  . Irritable bowel syndrome without diarrhea 04/22/2016  . Mitral valve prolapse 04/22/2016  . Mixed hyperlipidemia 04/22/2016  . Osteopenia 04/22/2016  . Low blood pressure 09/24/2014  . Hypercholesterolemia   . History of depression   . Scoliosis   . Osteoporosis   . DOE (dyspnea on exertion) 05/31/2008   3:08 PM, 02/28/20 Jerene Pitch, DPT Physical Therapy with [facility]  [phone] office  [facility] [address] [place], [place], [address] Phone: [phone]   Fax:  [phone]  Name: Carol Hardin MRN: [id number] Date of Birth: 1933-05-11

## 2020-02-29 ENCOUNTER — Telehealth: Payer: Self-pay | Admitting: Internal Medicine

## 2020-02-29 NOTE — Telephone Encounter (Signed)
New Message    Pt is calling and would like for the nurse to call her to discuss her pacer check on 05/03  She says she saws in Mychart the results were being sent to triage and no one has contacted her     Please call

## 2020-02-29 NOTE — Telephone Encounter (Signed)
LMOM requesting call back to DC. Direct number and office hours provided. 

## 2020-03-03 NOTE — Telephone Encounter (Signed)
Spoke with pt, reviewed results of 5/3 PM check in detail:   Carol Grayer, MD  02/11/2020 10:44 PM EDT    Remote device check reviewed.   Normal device function. Battery status, leads stable. Histograms reviewed and appropriate.    Routine follow-up     Conclusion  Scheduled remote reviewed. Normal device function. Chronic AF. Burden 99%. On [place]. AMS/HAR episodes all consecutive due to loss of AF wave sensing. Consider retesting AF sense threshold and turning Auto Sense off. R waves 4.6 mv from 12. Per VS chart  appears to be PVCs. Capture threshold and imp trending ok. Sending to triage to assess.   Next remote 91 days. [place]

## 2020-03-03 NOTE — Telephone Encounter (Signed)
The pt returning [place] phone call. I let her speak with device nurse Amy, rn.

## 2020-03-05 ENCOUNTER — Ambulatory Visit (INDEPENDENT_AMBULATORY_CARE_PROVIDER_SITE_OTHER): Payer: Medicare Other

## 2020-03-05 ENCOUNTER — Other Ambulatory Visit: Payer: Self-pay

## 2020-03-05 DIAGNOSIS — I5022 Chronic systolic (congestive) heart failure: Secondary | ICD-10-CM | POA: Diagnosis not present

## 2020-03-06 ENCOUNTER — Telehealth (HOSPITAL_COMMUNITY): Payer: Self-pay | Admitting: Physical Therapy

## 2020-03-06 ENCOUNTER — Ambulatory Visit (HOSPITAL_COMMUNITY): Payer: Self-pay | Admitting: Physical Therapy

## 2020-03-06 NOTE — Telephone Encounter (Signed)
pt called to cx due to death in her family

## 2020-03-07 ENCOUNTER — Telehealth: Payer: Self-pay | Admitting: Cardiology

## 2020-03-07 NOTE — Telephone Encounter (Signed)
Echo shows heart pumping function has decreased to 20% (down from 40-45%, normal is 50-60%). Needs an earlier appt, I am not in [place] the next few weeks, can we get her scheduled with Jonni Sanger in 1-2 weeks   Zandra Abts MD

## 2020-03-07 NOTE — Telephone Encounter (Signed)
Pt voiced understanding - scheduled with Katina Dung, NP 6/7

## 2020-03-07 NOTE — Telephone Encounter (Signed)
Patient called requesting test results of recent Echo.  

## 2020-03-13 ENCOUNTER — Telehealth: Payer: Self-pay | Admitting: Cardiology

## 2020-03-13 NOTE — Telephone Encounter (Signed)
Yes that is ok with me   Zandra Abts MD

## 2020-03-13 NOTE — Telephone Encounter (Signed)
Patient would like to change providers from Dr. Harl Bowie to Dr. Audie Box. The patient says her Husband sees Dr. Audie Box now and so she would like to see the same Doctor as her Husband

## 2020-03-13 NOTE — Telephone Encounter (Signed)
Left patient a message to call back to schedule an appointment with Dr. Audie Box. The patient requested a provider change and both providers approved the change.

## 2020-03-13 NOTE — Telephone Encounter (Signed)
That will be fine with me.   [name]. Audie Box, [place]  [address] [place], [place] [address] [phone]  12:43 PM

## 2020-03-16 NOTE — Progress Notes (Signed)
Cardiology Office Note  Date: 03/17/2020   ID: SEBRENA ENGH, DOB [birth year], MRN [id number]  PCP:  Celene Squibb, MD  Cardiologist:  Carlyle Dolly, MD Electrophysiologist:  Thompson Grayer, MD   Chief Complaint: F/U Chronic HFrEF, Afib/tachy-brady syndrome, BIVICD  History of Present Illness: Carol Hardin is a 86 y.o. female with a history of  Chronic HFrEF, Afib/tachy-brady syndrome, BIV pacemaker 01/2019. Recent echocardiogram with significantly reduced EF 20%.  She is here today for follow-up secondary to a recent echocardiogram which showed a significant change from last year in her EF.  Last year on echo her ejection fraction was 40 to 45%.  Most recent echo showed EF of 20%.  Patient has no recent issues with dyspnea, PND, orthopnea, weight gain, or significant lower extremity edema.  She does take Lasix as needed occasionally maybe once every week or so for mild lower extremity edema.  She states she has had a fair amount of weight loss since 2019 when she found out she had atrial fibrillation and tachybradycardia syndrome.  She denies any orthostatic symptoms, sensation of palpitations, stroke or TIA-like symptoms, bleeding issues.  Blood pressure is 100/80.  Heart rate is 64.  She has a BiV PPM and sees Dr. Rayann Heman.  She is transitioning care from here to Dr. Eleonore Chiquito.  She will see him on July 9.  She will see Dr. Rayann Heman on July 26  Past Medical History:  Diagnosis Date  . Anxiety   . Arthritis    "fingers, right toe" (08/09/2016)  . Basal cell carcinoma of lower leg, right   . Bradycardia    a. Holter 08/30/16 showed profound bradycardia down to 30 during awake hours, several 2 second pauses, very frequent PVCs >8000 in 48 hours, and NSVT (longest of 14 beats).  . Cardiomyopathy ([place])    a. EF 40% by echo 08/2016.  . Daily headache    "I usually wake up w/a headache; sometimes it's a migraine" (08/09/2016)  . Depression   . Diverticulosis    Hx. of  .  Frequent PVCs   . GERD (gastroesophageal reflux disease)   . Hypercholesterolemia   . Migraine   . Mitral regurgitation   . MVP (mitral valve prolapse)   . NSVT (nonsustained ventricular tachycardia) ([place])    a. first noted event monitor 08/2016.  . Osteoporosis   . PAF (paroxysmal atrial fibrillation) ([place])    a. in afib at time of echo 08/2016.  Marland Kitchen Pneumonia   . Scoliosis    mild  . Squamous cell carcinoma of neck     Past Surgical History:  Procedure Laterality Date  . AV NODE ABLATION N/A 01/23/2019   Procedure: AV NODE ABLATION;  Surgeon: Thompson Grayer, MD;  Location: [facility];  Service: Cardiovascular;  Laterality: N/A;  . BASAL CELL CARCINOMA EXCISION Right    RLE  . BIV PACEMAKER INSERTION CRT-P N/A 01/23/2019   Procedure: BIV PACEMAKER INSERTION CRT-P;  Surgeon: Thompson Grayer, MD;  Location: [facility];  Service: Cardiovascular;  Laterality: N/A;  . BLEPHAROPLASTY    . BREAST BIOPSY Left   . BREAST CYST ASPIRATION Left   . BREAST CYST EXCISION Left   . BUNIONECTOMY Bilateral   . DILATION AND CURETTAGE OF UTERUS    . EXCISIONAL HEMORRHOIDECTOMY    . INTRAMEDULLARY (IM) NAIL INTERTROCHANTERIC Left 12/31/2017   Procedure: INTRAMEDULLARY (IM) NAIL INTERTROCHANTRIC;  Surgeon: Nicholes Stairs, MD;  Location: [facility];  Service: Orthopedics;  Laterality: Left;  . KNEE ARTHROSCOPY Right 04/12/2007  . KNEE ARTHROSCOPY Left   . SQUAMOUS CELL CARCINOMA EXCISION     "neck"  . TONSILLECTOMY      Current Outpatient Medications  Medication Sig Dispense Refill  . acetaminophen (TYLENOL) 325 MG tablet Take 650 mg by mouth every 6 (six) hours as needed.    Marland Kitchen apixaban (ELIQUIS) 2.5 MG TABS tablet Take 1 tablet (2.5 mg total) by mouth 2 (two) times daily. 60 tablet 6  . Calcium Carbonate Antacid (TUMS PO) Take by mouth as needed.    . docusate sodium (COLACE) 100 MG capsule Take 100 mg by mouth daily as needed for mild constipation.    . Emollient (MEDERMA AG  FACE) CREA Apply 1 application topically daily.    . Ensure (ENSURE) Take 237 mLs by mouth daily.     . furosemide (LASIX) 20 MG tablet Take 20 mg by mouth as needed for fluid.    Marland Kitchen ipratropium (ATROVENT) 0.06 % nasal spray Place 1 spray into both nostrils 3 (three) times daily. 15 mL 5  . LORazepam (ATIVAN) 1 MG tablet Take 1 mg by mouth at bedtime.    [name] Glycol-Propyl Glycol (SYSTANE OP) Apply to eye as needed.    . Pramoxine-Ammonium Lactate (AMLACTIN AP EX) Apply 1 application topically daily.    . Probiotic Product (ALIGN PO) Take by mouth daily.    . Psyllium (METAMUCIL FIBER PO) Take by mouth daily. 1 1/2 teaspoons daily     No current facility-administered medications for this visit.   Allergies:  Omeprazole, Flagyl [metronidazole], Amiodarone, Aripiprazole, Hylan g-f 20, Paroxetine hcl, Statins, Sulfa antibiotics, Toprol xl [metoprolol succinate], Vancomycin, and Penicillins   Social History: The patient  reports that she quit smoking about 32 years ago. Her smoking use included cigarettes. She has a 20.00 pack-year smoking history. She has never used smokeless tobacco. She reports that she does not drink alcohol or use drugs.   Family History: The patient's family history includes Heart failure in her father and mother; Leukemia in her mother.   ROS:  Please see the history of present illness. Otherwise, complete review of systems is positive for none.  All other systems are reviewed and negative.   Physical Exam: VS:  BP 100/80   Pulse 64   Ht 5\' 4"  (1.626 m)   Wt 123 lb 12.8 oz (56.2 kg)   SpO2 97%   BMI 21.25 kg/m , BMI Body mass index is 21.25 kg/m.  Wt Readings from Last 3 Encounters:  03/17/20 123 lb 12.8 oz (56.2 kg)  01/25/20 125 lb 9.6 oz (57 kg)  01/07/20 126 lb (57.2 kg)    General: Patient appears comfortable at rest. Neck: Supple, no elevated JVP or carotid bruits, no thyromegaly. Lungs: Clear to auscultation, nonlabored breathing at  rest. Cardiac: Irregularly irregular rate and rhythm, no S3 or significant systolic murmur, no pericardial rub. Extremities: No pitting edema, distal pulses 2+. Skin: Warm and dry. Musculoskeletal: No kyphosis. Neuropsychiatric: Alert and oriented x3, affect grossly appropriate.  ECG:  EKG November 12, 2019 showed V paced rhythm at rate of 75 with underlying atrial fibrillation rate of 75  Recent Labwork: 08/20/2019: ALT 29; AST 39; BUN 18; Creatinine, Ser 0.97; Hemoglobin 12.8; Platelets 194; Potassium 4.3; Sodium 137 01/07/2020: Pro B Natriuretic peptide (BNP) 569.0     Component Value Date/Time   CHOL 140 01/23/2019 0631   TRIG 64 01/23/2019 0631   HDL 42 01/23/2019 0631  CHOLHDL 3.3 01/23/2019 0631   VLDL 13 01/23/2019 0631   LDLCALC 85 01/23/2019 0631    Other Studies Reviewed Today:  CT Chest high resolution 11/22/2019 IMPRESSION: 1. No evidence of fibrotic interstitial lung disease or amiodarone toxicity. 2. Mild basilar predominant septal thickening suggests pulmonary edema. 3. Air trapping is indicative of small airways disease. 4. Mild mediastinal adenopathy, possibly reactive. Difficult to definitively exclude a lymphoproliferative disorder. 5. Aortic atherosclerosis (ICD10-I70.0). Coronary artery calcification. 6. Enlarged pulmonic trunk, indicative of pulmonary arterial hypertension.   03/05/2020 Echo 1. Left ventricular ejection fraction, by estimation, is 20%. The left ventricle has severely decreased function. The left ventricle demonstrates regional wall motion abnormalities (see scoring diagram/findings for description). The left ventricular internal cavity size was moderately dilated. Left ventricular diastolic parameters are indeterminate. Elevated left atrial pressure. 2. Right ventricular systolic function is moderately reduced. The right ventricular size is moderately enlarged. There is mildly elevated pulmonary artery systolic pressure. 3. Left  atrial size was severely dilated. 4. Right atrial size was moderately dilated. 5. The mitral valve is degenerative. Moderate to severe mitral valve regurgitation. 6. Tricuspid valve regurgitation is moderate. 7. The aortic valve is tricuspid. Aortic valve regurgitation is mild. No aortic stenosis is present. 8. The inferior vena cava is dilated in size with <50% respiratory variability, suggesting right atrial pressure of 15 mmHg.    11/2018 echo IMPRESSIONS 1. The left ventricle has mild-moderately reduced systolic function of 00-17%. The cavity size was normal. There is concentric left ventricular hypertrophy. Left ventricular diastology could not be evaluated secondary to atrial fibrillation. Elevated  left ventricular end-diastolic pressure Left ventricular diffuse hypokinesis. 2. The right ventricle has moderately reduced systolic function. The cavity was mildly enlarged. There is no increase in right ventricular wall thickness. 3. Left atrial size was mildly dilated. 4. Right atrial size was moderately dilated. 5. Moderate pleural effusion in the left lateral region. 6. The mitral valve is myxomatous. There is mild thickening. Mitral valve regurgitation is moderate by color flow Doppler. 7. The tricuspid valve is normal in structure. 8. The aortic valve is tricuspid Aortic valve regurgitation is mild by color flow Doppler. 9. There is dilatation of the aortic root. 10. The inferior vena cava was dilated in size with >50% respiratory variability.  Assessment and Plan:  1. Chronic systolic heart failure ([place])   2. Tachy-brady syndrome (H[facility])   3. Permanent atrial fibrillation ([place])    1. Chronic systolic heart failure (H[facility]) Recent echocardiogram shows a decrease in EF from 40-45% last year in February. Recent EF estimated at 20% with RWMA's not present on previous echo. Recent echo also shows moderate to severe  MR compared to moderate MR on previous echo. She denies any  progressive anginal or exertional symptoms. No weight gain or LE edema, PND or orthopnea. Today's BP 100/80. Continue Lasix as needed for fluid. Follow up with Dr Jenetta DownerNori Riis. Given her lack of symptoms I would defer to Dr Jenetta DownerNori Riis as to accuracy of estimated EF on recent echo. He may want to review the echo when he sees her.   2. Tachy-brady syndrome ([place]) S/P BIV pacemaker 01/2019. Recent device check showed normal device function. Follow up with Dr Rayann Heman.  3. Atrial fibrillation Recent device check on 02/11/2020 showed chronic AF with 99% burden. Continue Eliquis 2.5 mg po bid  Medication Adjustments/Labs and Tests Ordered: Current medicines are reviewed at length with the patient today.  Concerns regarding medicines are outlined above.   Disposition: Follow-up with  Drs. O'Neal and Allred as scheduled  Signed, Levell July, NP 03/17/2020 8:05 PM    [facility] [place], [place], [place] [address] Phone: [phone]; Fax: [phone]

## 2020-03-17 ENCOUNTER — Other Ambulatory Visit: Payer: Self-pay

## 2020-03-17 ENCOUNTER — Encounter: Payer: Self-pay | Admitting: Family Medicine

## 2020-03-17 ENCOUNTER — Ambulatory Visit: Payer: Medicare Other | Admitting: Family Medicine

## 2020-03-17 VITALS — BP 100/80 | HR 64 | Ht 64.0 in | Wt 123.8 lb

## 2020-03-17 DIAGNOSIS — I5022 Chronic systolic (congestive) heart failure: Secondary | ICD-10-CM

## 2020-03-17 DIAGNOSIS — I4821 Permanent atrial fibrillation: Secondary | ICD-10-CM

## 2020-03-17 DIAGNOSIS — I495 Sick sinus syndrome: Secondary | ICD-10-CM | POA: Diagnosis not present

## 2020-03-17 NOTE — Patient Instructions (Signed)
Medication Instructions:   Your physician recommends that you continue on your current medications as directed. Please refer to the Current Medication list given to you today.  Labwork:  NONE  Testing/Procedures:  NONE  Follow-Up:  Your physician recommends that you schedule a follow-up appointment in: as planned with Dr. Audie Box.  Any Other Special Instructions Will Be Listed Below (If Applicable).  If you need a refill on your cardiac medications before your next appointment, please call your pharmacy.

## 2020-03-27 ENCOUNTER — Ambulatory Visit (HOSPITAL_COMMUNITY): Payer: Medicare Other | Attending: Primary Care | Admitting: Physical Therapy

## 2020-03-27 ENCOUNTER — Other Ambulatory Visit: Payer: Self-pay

## 2020-03-27 ENCOUNTER — Encounter (HOSPITAL_COMMUNITY): Payer: Self-pay | Admitting: Physical Therapy

## 2020-03-27 DIAGNOSIS — R262 Difficulty in walking, not elsewhere classified: Secondary | ICD-10-CM | POA: Diagnosis not present

## 2020-03-27 DIAGNOSIS — M6281 Muscle weakness (generalized): Secondary | ICD-10-CM | POA: Diagnosis present

## 2020-03-27 DIAGNOSIS — M25562 Pain in left knee: Secondary | ICD-10-CM

## 2020-03-27 DIAGNOSIS — G8929 Other chronic pain: Secondary | ICD-10-CM | POA: Diagnosis present

## 2020-03-27 NOTE — Therapy (Signed)
[place], [place], [address] Phone: [phone]   Fax:  [phone]  Physical Therapy Treatment, Progress Note and RECERT  Patient Details  Name: Carol Hardin MRN: [id number] Date of Birth: 27-May-1933 Referring Provider (PT): Geraldo Pitter, NP  Progress Note Reporting Period 01/23/20 to 03/27/20  See note below for Objective Data and Assessment of Progress/Goals.       Encounter Date: 03/27/2020   PT End of Session - 03/27/20 1456    Visit Number 6    Number of Visits 15    Date for PT Re-Evaluation 05/22/20   eval 01/23/20, PN 03/27/20   Authorization Type UHC Medicare. No auth, No VL, Co- insurance 10%    Progress Note Due on Visit 16    PT Start Time 1452   patient late to session   PT Stop Time 1534    PT Time Calculation (min) 42 min    Equipment Utilized During Treatment Gait belt    Activity Tolerance Patient tolerated treatment well    Behavior During Therapy WFL for tasks assessed/performed           Past Medical History:  Diagnosis Date  . Anxiety   . Arthritis    "fingers, right toe" (08/09/2016)  . Basal cell carcinoma of lower leg, right   . Bradycardia    a. Holter 08/30/16 showed profound bradycardia down to 30 during awake hours, several 2 second pauses, very frequent PVCs >8000 in 48 hours, and NSVT (longest of 14 beats).  . Cardiomyopathy ([place])    a. EF 40% by echo 08/2016.  . Daily headache    "I usually wake up w/a headache; sometimes it's a migraine" (08/09/2016)  . Depression   . Diverticulosis    Hx. of  . Frequent PVCs   . GERD (gastroesophageal reflux disease)   . Hypercholesterolemia   . Migraine   . Mitral regurgitation   . MVP (mitral valve prolapse)   . NSVT (nonsustained ventricular tachycardia) ([place])    a. first noted event monitor 08/2016.  . Osteoporosis   . PAF (paroxysmal atrial fibrillation) ([place])    a. in afib at time of echo 08/2016.  Marland Kitchen  Pneumonia   . Scoliosis    mild  . Squamous cell carcinoma of neck     Past Surgical History:  Procedure Laterality Date  . AV NODE ABLATION N/A 01/23/2019   Procedure: AV NODE ABLATION;  Surgeon: Thompson Grayer, MD;  Location: [place] [facility];  Service: Cardiovascular;  Laterality: N/A;  . BASAL CELL CARCINOMA EXCISION Right    RLE  . BIV PACEMAKER INSERTION CRT-P N/A 01/23/2019   Procedure: BIV PACEMAKER INSERTION CRT-P;  Surgeon: Thompson Grayer, MD;  Location: [place] [facility];  Service: Cardiovascular;  Laterality: N/A;  . BLEPHAROPLASTY    . BREAST BIOPSY Left   . BREAST CYST ASPIRATION Left   . BREAST CYST EXCISION Left   . BUNIONECTOMY Bilateral   . DILATION AND CURETTAGE OF UTERUS    . EXCISIONAL HEMORRHOIDECTOMY    . INTRAMEDULLARY (IM) NAIL INTERTROCHANTERIC Left 12/31/2017   Procedure: INTRAMEDULLARY (IM) NAIL INTERTROCHANTRIC;  Surgeon: Nicholes Stairs, MD;  Location: [facility];  Service: Orthopedics;  Laterality: Left;  . KNEE ARTHROSCOPY Right 04/12/2007  . KNEE ARTHROSCOPY Left   . SQUAMOUS CELL CARCINOMA EXCISION     "neck"  . TONSILLECTOMY      There were no vitals filed for this visit.  [facility] PT Assessment - 03/27/20 0001      Assessment   Medical Diagnosis Deconditioning    Referring Provider (PT) Geraldo Pitter, NP      Ambulation/Gait   Ambulation/Gait Yes    Ambulation/Gait Assistance 5: Supervision    Ambulation Distance (Feet) 338 Feet   was 196 feet wtih cane    Assistive device None    Gait Pattern Decreased arm swing - right;Decreased arm swing - left;Decreased step length - right;Decreased step length - left;Decreased hip/knee flexion - left;Trunk flexed    Gait velocity decreased    Gait Comments 2MW      Static Standing Balance   Static Standing - Comment/# of Minutes L SLS 22 seconds, and R SLS 6 second   was 30 sec B                         [facility] Adult PT Treatment/Exercise - 03/27/20 0001      Knee/Hip  Exercises: Standing   Other Standing Knee Exercises hamstring curls with UE assist 2x15 B                   PT Education - 03/27/20 1545    Education Details educated patient in progress, regression in balance, importance of adequate calories. Reviewed nutrition and meals/veggies that are already prepped. Discussed and educated patient in getting groceries to go (delivery service to car). Answered all questions about current condition, POC and concerns about ejection fraction with recent cardiac test.    Person(s) Educated Patient    Methods Explanation    Comprehension Verbalized understanding            PT Short Term Goals - 03/27/20 1456      PT SHORT TERM GOAL #1   Title Patient will be independent in self management strategies to improve quality of life and functional outcomes.    Time 3    Period Weeks    Status On-going    Target Date 02/13/20      PT SHORT TERM GOAL #2   Title Patient will report at least 25% improvement in overall symptoms and/or functional ability.    Baseline 6/17 100% better in breathing techniques, and this has helped with walking but she is still weak    Time 3    Period Weeks    Status Achieved    Target Date 02/13/20             PT Long Term Goals - 03/27/20 1500      PT LONG TERM GOAL #1   Title Patient will report at least 50% improvement in overall symptoms and/or functional ability.    Time 6    Period Weeks    Status Achieved      PT LONG TERM GOAL #2   Title Patient will be able to walk at least 5 minutes without sititng down to rest to improve ability to walk around neighborhood.    Baseline 6/17 30 minutes and then she needs to sit down    Time 6    Period Weeks    Status Achieved      PT LONG TERM GOAL #3   Title Patient will be able to stand on either leg for at least 30 seconds to demonstrate improved static balance.    Baseline L 20 seconds and R 6 seconds    Time 6    Period Weeks    Status On-going  PT LONG TERM GOAL #4   Title Patient will report eating at least 1200 calories/day regularly to improve ability to maintain weight and energy level.    Time 8    Period Weeks    Status New    Target Date 05/22/20      PT LONG TERM GOAL #5   Title Patient will report she has started to walk around the neighborhood to improve ability to perform community walking program.    Time 8    Target Date 05/22/20                 Plan - 03/27/20 1547    Clinical Impression Statement Patient present for a progress note on this date. Session focused on education and plan moving forward. Significant improvement noted in patient's ability to walk around and with 2MW test. Continued limitations in patient's overall eating/nutrition habits, generalized weakness and balance. Extending POC out additional 8 wees for 3 visits over that 8-week period. Focus will be on HEP development, continued education and guidance with general nutrition and eating variety of fruits and veggies as well as enough calories to maintain current weight. Patient would continue to benefit from skilled physical therapy.    Personal Factors and Comorbidities Age;Comorbidity 1;Comorbidity 2;Comorbidity 3+    Comorbidities pacemaker, afib, diverticulitis, recent weight loss, stroke, shortness of breath    Examination-Activity Limitations Bathing;Bend;Stand;Stairs;Squat    Examination-Participation Restrictions Cleaning;Community Activity;Laundry;Meal Prep    Stability/Clinical Decision Making Evolving/Moderate complexity    Rehab Potential Good    PT Frequency Other (comment)   3 visits over 8 week certificaiton period   PT Duration 8 weeks    PT Treatment/Interventions ADLs/Self Care Home Management;Aquatic Therapy;Cryotherapy;Electrical Stimulation;DME Instruction;Traction;Moist Heat;Gait training;Stair training;Functional mobility training;Therapeutic activities;Therapeutic exercise;Balance training;Patient/family  education;Neuromuscular re-education;Dry needling;Energy conservation;Passive range of motion    PT Next Visit Plan monitor BP, standing balance and strengthening, SLS, squats, standing exercises, nutrition education/management    PT Home Exercise Plan 4/22 lateral stepping; 5/6 hip ext, abd. LAQs; 5/13 TRA activation, pelvic tilts supine, deep breathing. 6/17 walking program    Consulted and Agree with Plan of Care Patient           Patient will benefit from skilled therapeutic intervention in order to improve the following deficits and impairments:  Decreased activity tolerance, Decreased balance, Decreased mobility, Decreased strength, Increased edema, Pain, Decreased endurance, Abnormal gait, Decreased range of motion, Difficulty walking  Visit Diagnosis: Difficulty in walking, not elsewhere classified  Muscle weakness (generalized)  Chronic pain of left knee     Problem List Patient Active Problem List   Diagnosis Date Noted  . Muscular deconditioning 01/07/2020  . Dysphagia 03/30/2019  . Constipation 03/30/2019  . Pacemaker   . Chronic respiratory failure with hypoxia ([place])   . On home O2   . Sick sinus syndrome ([place]) 01/22/2019  . CKD (chronic kidney disease) stage 3, GFR 30-59 ml/min 01/21/2019  . Atrial fibrillation with RVR ([place]) 01/20/2019  . Acute on chronic systolic CHF (congestive heart failure) ([place]) 12/25/2018  . Tachy-brady syndrome ([place]) 12/25/2018  . Atrial fibrillation, chronic ([place]) 12/24/2018  . Congestive heart failure with left ventricular diastolic dysfunction, unspecified failure chronicity ([place]) 12/24/2018  . Malnutrition of moderate degree 11/20/2018  . Atrial fibrillation with rapid ventricular response ([place]) 11/18/2018  . GERD (gastroesophageal reflux disease) 11/18/2018  . CAP (community acquired pneumonia) 11/18/2018  . Acute on chronic diastolic HF (heart failure) ([place]) 11/18/2018  . Stroke (cerebrum) ([place]) 05/19/2018  . Hip  fracture ([place])  12/31/2017  . PAF (paroxysmal atrial fibrillation) ([place])   . NSVT (nonsustained ventricular tachycardia) ([place])   . Mitral regurgitation   . Frequent PVCs   . Bradycardia   . Essential hypertension   . Hypokalemia   . Diverticulitis 08/09/2016  . Diverticulosis of colon 04/22/2016  . Diverticulosis of large intestine without perforation or abscess without bleeding 04/22/2016  . Generalized anxiety disorder 04/22/2016  . Irritable bowel syndrome without diarrhea 04/22/2016  . Mitral valve prolapse 04/22/2016  . Mixed hyperlipidemia 04/22/2016  . Osteopenia 04/22/2016  . Low blood pressure 09/24/2014  . Hypercholesterolemia   . History of depression   . Scoliosis   . Osteoporosis   . DOE (dyspnea on exertion) 05/31/2008   3:53 PM, 03/27/20 Jerene Pitch, DPT Physical Therapy with [facility]  [phone] office  [place] [address] [place], [place], [address] Phone: [phone]   Fax:  [phone]  Name: Carol Hardin MRN: [id number] Date of Birth: 1933-02-07

## 2020-04-09 ENCOUNTER — Ambulatory Visit (HOSPITAL_COMMUNITY): Payer: Medicare Other | Admitting: Physical Therapy

## 2020-04-09 ENCOUNTER — Other Ambulatory Visit: Payer: Self-pay

## 2020-04-09 ENCOUNTER — Encounter (HOSPITAL_COMMUNITY): Payer: Self-pay | Admitting: Physical Therapy

## 2020-04-09 DIAGNOSIS — M25562 Pain in left knee: Secondary | ICD-10-CM

## 2020-04-09 DIAGNOSIS — R262 Difficulty in walking, not elsewhere classified: Secondary | ICD-10-CM

## 2020-04-09 DIAGNOSIS — M6281 Muscle weakness (generalized): Secondary | ICD-10-CM

## 2020-04-09 DIAGNOSIS — G8929 Other chronic pain: Secondary | ICD-10-CM

## 2020-04-09 NOTE — Therapy (Signed)
[place], [place], [address] Phone: [phone]   Fax:  [phone]  Physical Therapy Treatment  Patient Details  Name: Carol Hardin MRN: [id number] Date of Birth: [birth year] Referring Provider (PT): Geraldo Pitter, NP   Encounter Date: 04/09/2020   PT End of Session - 04/09/20 1407    Visit Number 7    Number of Visits 15    Date for PT Re-Evaluation 05/22/20   eval 01/23/20, PN 03/27/20   Authorization Type UHC Medicare. No auth, No VL, Co- insurance 10%    Progress Note Due on Visit 16    PT Start Time 1404    PT Stop Time 1444    PT Time Calculation (min) 40 min    Equipment Utilized During Treatment Gait belt    Activity Tolerance Patient tolerated treatment well    Behavior During Therapy WFL for tasks assessed/performed           Past Medical History:  Diagnosis Date   Anxiety    Arthritis    "fingers, right toe" (08/09/2016)   Basal cell carcinoma of lower leg, right    Bradycardia    a. Holter 08/30/16 showed profound bradycardia down to 30 during awake hours, several 2 second pauses, very frequent PVCs >8000 in 48 hours, and NSVT (longest of 14 beats).   Cardiomyopathy ([place])    a. EF 40% by echo 08/2016.   Daily headache    "I usually wake up w/a headache; sometimes it's a migraine" (08/09/2016)   Depression    Diverticulosis    Hx. of   Frequent PVCs    GERD (gastroesophageal reflux disease)    Hypercholesterolemia    Migraine    Mitral regurgitation    MVP (mitral valve prolapse)    NSVT (nonsustained ventricular tachycardia) ([place])    a. first noted event monitor 08/2016.   Osteoporosis    PAF (paroxysmal atrial fibrillation) ([place])    a. in afib at time of echo 08/2016.   Pneumonia    Scoliosis    mild   Squamous cell carcinoma of neck     Past Surgical History:  Procedure Laterality Date   AV NODE ABLATION N/A 01/23/2019   Procedure: AV NODE  ABLATION;  Surgeon: Thompson Grayer, MD;  Location: [place] [facility];  Service: Cardiovascular;  Laterality: N/A;   BASAL CELL CARCINOMA EXCISION Right    RLE   BIV PACEMAKER INSERTION CRT-P N/A 01/23/2019   Procedure: BIV PACEMAKER INSERTION CRT-P;  Surgeon: Thompson Grayer, MD;  Location: [place] [facility];  Service: Cardiovascular;  Laterality: N/A;   BLEPHAROPLASTY     BREAST BIOPSY Left    BREAST CYST ASPIRATION Left    BREAST CYST EXCISION Left    BUNIONECTOMY Bilateral    DILATION AND CURETTAGE OF UTERUS     EXCISIONAL HEMORRHOIDECTOMY     INTRAMEDULLARY (IM) NAIL INTERTROCHANTERIC Left 12/31/2017   Procedure: INTRAMEDULLARY (IM) NAIL INTERTROCHANTRIC;  Surgeon: Nicholes Stairs, MD;  Location: [place];  Service: Orthopedics;  Laterality: Left;   KNEE ARTHROSCOPY Right 04/12/2007   KNEE ARTHROSCOPY Left    SQUAMOUS CELL CARCINOMA EXCISION     "neck"   TONSILLECTOMY      There were no vitals filed for this visit.   Subjective Assessment - 04/09/20 1415    Subjective States that she was feeling a little dizzy earlier today but she feels like she is almost back to her baseline. States that she  doesnt know if she is scared that she is too scared to eat things because they are too salty or not. States that she still hasnt tried the frozen items yet but plans to today.    Pertinent History CHF, Shortness of breath, hosteoporosis, depression    Limitations Walking;Standing;House hold activities    Patient Stated Goals to be able to walk around the block              [facility] PT Assessment - 04/09/20 0001      Assessment   Medical Diagnosis Deconditioning    Referring Provider (PT) Geraldo Pitter, NP      Observation/Other Assessments   Observations vitals 110/73, HR 69                         [facility] Adult PT Treatment/Exercise - 04/09/20 0001      Knee/Hip Exercises: Seated   Other Seated Knee/Hip Exercises seated w's RTB 3x10 with breath       Other Seated Knee/Hip Exercises squats at table - tactile cues 5x5                   PT Education - 04/09/20 1435    Education Details Continued to educate patient on importance of eating more food as she is still undereating - discussed patients relationship with food as she is fearful of eating anything because of her foods that have any kinds of seeds (because of her diverticulitis), eating anything with any salt (for her heart disease). Educated on importance of eating enough calories    Person(s) Educated Patient    Methods Explanation    Comprehension Verbalized understanding            PT Short Term Goals - 03/27/20 1456      PT SHORT TERM GOAL #1   Title Patient will be independent in self management strategies to improve quality of life and functional outcomes.    Time 3    Period Weeks    Status On-going    Target Date 02/13/20      PT SHORT TERM GOAL #2   Title Patient will report at least 25% improvement in overall symptoms and/or functional ability.    Baseline 6/17 100% better in breathing techniques, and this has helped with walking but she is still weak    Time 3    Period Weeks    Status Achieved    Target Date 02/13/20             PT Long Term Goals - 03/27/20 1500      PT LONG TERM GOAL #1   Title Patient will report at least 50% improvement in overall symptoms and/or functional ability.    Time 6    Period Weeks    Status Achieved      PT LONG TERM GOAL #2   Title Patient will be able to walk at least 5 minutes without sititng down to rest to improve ability to walk around neighborhood.    Baseline 6/17 30 minutes and then she needs to sit down    Time 6    Period Weeks    Status Achieved      PT LONG TERM GOAL #3   Title Patient will be able to stand on either leg for at least 30 seconds to demonstrate improved static balance.    Baseline L 20 seconds and R 6 seconds    Time 6  Period Weeks    Status On-going      PT LONG  TERM GOAL #4   Title Patient will report eating at least 1200 calories/day regularly to improve ability to maintain weight and energy level.    Time 8    Period Weeks    Status New    Target Date 05/22/20      PT LONG TERM GOAL #5   Title Patient will report she has started to walk around the neighborhood to improve ability to perform community walking program.    Time 8    Target Date 05/22/20                 Plan - 04/09/20 1408    Clinical Impression Statement Continued nutritional deficit suspected despite continued education on making sure she consumes enough calories to maintain her current weight. Discussed patients current relationship with food as she is very fearful of eating anything secondary to her health conditions. Discussed talking to her new cardiologist on July 7th about her current relationship with food and working on increasing her overall caloric intake. Added new exercises to HEP. Will follow up with nutrition and home program next session.    Personal Factors and Comorbidities Age;Comorbidity 1;Comorbidity 2;Comorbidity 3+    Comorbidities pacemaker, afib, diverticulitis, recent weight loss, stroke, shortness of breath    Examination-Activity Limitations Bathing;Bend;Stand;Stairs;Squat    Examination-Participation Restrictions Cleaning;Community Activity;Laundry;Meal Prep    Stability/Clinical Decision Making Evolving/Moderate complexity    Rehab Potential Good    PT Frequency Other (comment)   3 visits over 8 week certificaiton period   PT Duration 8 weeks    PT Treatment/Interventions ADLs/Self Care Home Management;Aquatic Therapy;Cryotherapy;Electrical Stimulation;DME Instruction;Traction;Moist Heat;Gait training;Stair training;Functional mobility training;Therapeutic activities;Therapeutic exercise;Balance training;Patient/family education;Neuromuscular re-education;Dry needling;Energy conservation;Passive range of motion    PT Next Visit Plan monitor BP,  standing balance and strengthening, SLS, squats, standing exercises, nutrition education/management    PT Home Exercise Plan 4/22 lateral stepping; 5/6 hip ext, abd. LAQs; 5/13 TRA activation, pelvic tilts supine, deep breathing. 6/17 walking program; 6/30 W's    Consulted and Agree with Plan of Care Patient           Patient will benefit from skilled therapeutic intervention in order to improve the following deficits and impairments:  Decreased activity tolerance, Decreased balance, Decreased mobility, Decreased strength, Increased edema, Pain, Decreased endurance, Abnormal gait, Decreased range of motion, Difficulty walking  Visit Diagnosis: Difficulty in walking, not elsewhere classified  Muscle weakness (generalized)  Chronic pain of left knee     Problem List Patient Active Problem List   Diagnosis Date Noted   Muscular deconditioning 01/07/2020   Dysphagia 03/30/2019   Constipation 03/30/2019   Pacemaker    Chronic respiratory failure with hypoxia ([facility])    On home O2    Sick sinus syndrome ([place]) 01/22/2019   CKD (chronic kidney disease) stage 3, GFR 30-59 ml/min 01/21/2019   Atrial fibrillation with RVR ([place]) 01/20/2019   Acute on chronic systolic CHF (congestive heart failure) ([place]) 12/25/2018   Tachy-brady syndrome ([place]) 12/25/2018   Atrial fibrillation, chronic ([place]) 12/24/2018   Congestive heart failure with left ventricular diastolic dysfunction, unspecified failure chronicity ([place]) 12/24/2018   Malnutrition of moderate degree 11/20/2018   Atrial fibrillation with rapid ventricular response ([place]) 11/18/2018   GERD (gastroesophageal reflux disease) 11/18/2018   CAP (community acquired pneumonia) 11/18/2018   Acute on chronic diastolic HF (heart failure) ([place]) 11/18/2018   Stroke (cerebrum) ([place]) 05/19/2018   Hip fracture (  [place]) 12/31/2017   PAF (paroxysmal atrial fibrillation) (H[facility])    NSVT (nonsustained ventricular tachycardia) (H[facility])     Mitral regurgitation    Frequent PVCs    Bradycardia    Essential hypertension    Hypokalemia    Diverticulitis 08/09/2016   Diverticulosis of colon 04/22/2016   Diverticulosis of large intestine without perforation or abscess without bleeding 04/22/2016   Generalized anxiety disorder 04/22/2016   Irritable bowel syndrome without diarrhea 04/22/2016   Mitral valve prolapse 04/22/2016   Mixed hyperlipidemia 04/22/2016   Osteopenia 04/22/2016   Low blood pressure 09/24/2014   Hypercholesterolemia    History of depression    Scoliosis    Osteoporosis    DOE (dyspnea on exertion) 05/31/2008    3:30 PM, 04/09/20 Jerene Pitch, DPT Physical Therapy with [facility]  [phone] office  [place], [place], [address] Phone: [phone]   Fax:  [phone]  Name: CADIE SORCI MRN: [id number] Date of Birth: [birth year]

## 2020-04-18 ENCOUNTER — Ambulatory Visit: Payer: Medicare Other | Admitting: Cardiovascular Disease

## 2020-04-18 ENCOUNTER — Encounter: Payer: Self-pay | Admitting: Cardiovascular Disease

## 2020-04-18 ENCOUNTER — Other Ambulatory Visit: Payer: Self-pay

## 2020-04-18 VITALS — BP 109/76 | HR 74 | Ht 64.5 in | Wt 125.0 lb

## 2020-04-18 DIAGNOSIS — I495 Sick sinus syndrome: Secondary | ICD-10-CM | POA: Diagnosis not present

## 2020-04-18 DIAGNOSIS — I5022 Chronic systolic (congestive) heart failure: Secondary | ICD-10-CM

## 2020-04-18 DIAGNOSIS — I4821 Permanent atrial fibrillation: Secondary | ICD-10-CM | POA: Diagnosis not present

## 2020-04-18 DIAGNOSIS — Z950 Presence of cardiac pacemaker: Secondary | ICD-10-CM | POA: Diagnosis not present

## 2020-04-18 DIAGNOSIS — I340 Nonrheumatic mitral (valve) insufficiency: Secondary | ICD-10-CM

## 2020-04-18 MED ORDER — CARVEDILOL 3.125 MG PO TABS: 3.1250 mg | ORAL_TABLET | Freq: Two times a day (BID) | ORAL | 3 refills | Status: DC

## 2020-04-18 NOTE — Progress Notes (Signed)
Cardiology Office Note:   Date:  04/18/2020  NAME:  Carol Hardin    MRN: [id number] DOB:  02/12/1933   PCP:  Celene Squibb, MD  Cardiologist:  Carlyle Dolly, MD  Electrophysiologist:  Thompson Grayer, MD   Referring MD: Celene Squibb, MD   Chief Complaint  Patient presents with  . Congestive Heart Failure   History of Present Illness:   Carol Hardin is a 86 y.o. female with a hx of Systolic HF, SSS/Tachy-brady/AF s/p AVN ablation, Mitral valve prolapse who presents for follow-up of CHF.   She has a longstanding history of systolic HF. I have personally reviewed her echocardiograms below. She also has a history of PMVL prolapse. She developed HF in 2017 at the time of finding Afib. She was intolerant of several rhythm control strategies and this ultimately resulted in AVN ablation and CRT-P. Her EFs have been reported to go from 40% up to 55% and now down to 20%. She has not been on any HF medications due to reported hypotension. To me, her EF has never recovered. She also has had moderate to severe MR on all echocardiograms. She remains on eliquis and EKG today shows BiV paced rhythm in Afib.   She reports she has no energy. She has evidence of increased volume today. Her only HF medication is lasix. Her recent echo shows EF 20% with severe MR. Her PMVL clearly had prolapse a few years ago but now this leaflet is tethered due to her EF 20% and LV is severely dilated. She reports she mainly does activity around her house but no structured exercise. She denies CP. She gets quite SOB with minimal activity.   Echo 06/24/2010: Report reviewed; EF 55-60% with moderate MR and PMVL prolapse  Echo 08/26/2016: Afib with RVR, EF 40%, PMVL prolapse, myxomatous valve with moderate to severe MR Echo 05/20/2018: SB, EF 40%, PMVL prolapse, with moderate to severe MR Echo 11/19/2018: EF 35-40%, PMVL prolapse, with moderate to severe MR Echo 03/05/2020: EF 20%, Severely dilated LV, LVIDD 7.0 cm, LVOT VTI  12 cm, BIV Paced, Severe MR  Problem List 1. Mitral Valve prolapse (PMVL prolapse with moderate to severe MR in 2017) 2. Systolic HF, EF 53% -non-ischemic etiology? 3 SSS/Afib/Tachy-brady s/p CRT-P 4. Permanent Afib s/p AVN ablation  -intolerant of rhythm control agents and AVN ablation pursued 5. CVA -05/2018  Past Medical History: Past Medical History:  Diagnosis Date  . Anxiety   . Arthritis    "fingers, right toe" (08/09/2016)  . Basal cell carcinoma of lower leg, right   . Bradycardia    a. Holter 08/30/16 showed profound bradycardia down to 30 during awake hours, several 2 second pauses, very frequent PVCs >8000 in 48 hours, and NSVT (longest of 14 beats).  . Cardiomyopathy ([place])    a. EF 40% by echo 08/2016.  . Daily headache    "I usually wake up w/a headache; sometimes it's a migraine" (08/09/2016)  . Depression   . Diverticulosis    Hx. of  . Frequent PVCs   . GERD (gastroesophageal reflux disease)   . Hypercholesterolemia   . Migraine   . Mitral regurgitation   . MVP (mitral valve prolapse)   . NSVT (nonsustained ventricular tachycardia) ([place])    a. first noted event monitor 08/2016.  . Osteoporosis   . PAF (paroxysmal atrial fibrillation) ([place])    a. in afib at time of echo 08/2016.  Marland Kitchen Pneumonia   . Scoliosis  mild  . Squamous cell carcinoma of neck     Past Surgical History: Past Surgical History:  Procedure Laterality Date  . AV NODE ABLATION N/A 01/23/2019   Procedure: AV NODE ABLATION;  Surgeon: Thompson Grayer, MD;  Location: [facility];  Service: Cardiovascular;  Laterality: N/A;  . BASAL CELL CARCINOMA EXCISION Right    RLE  . BIV PACEMAKER INSERTION CRT-P N/A 01/23/2019   Procedure: BIV PACEMAKER INSERTION CRT-P;  Surgeon: Thompson Grayer, MD;  Location: [facility];  Service: Cardiovascular;  Laterality: N/A;  . BLEPHAROPLASTY    . BREAST BIOPSY Left   . BREAST CYST ASPIRATION Left   . BREAST CYST EXCISION Left   . BUNIONECTOMY  Bilateral   . DILATION AND CURETTAGE OF UTERUS    . EXCISIONAL HEMORRHOIDECTOMY    . INTRAMEDULLARY (IM) NAIL INTERTROCHANTERIC Left 12/31/2017   Procedure: INTRAMEDULLARY (IM) NAIL INTERTROCHANTRIC;  Surgeon: Nicholes Stairs, MD;  Location: [facility];  Service: Orthopedics;  Laterality: Left;  . KNEE ARTHROSCOPY Right 04/12/2007  . KNEE ARTHROSCOPY Left   . SQUAMOUS CELL CARCINOMA EXCISION     "neck"  . TONSILLECTOMY      Current Medications: Current Meds  Medication Sig  . acetaminophen (TYLENOL) 325 MG tablet Take 650 mg by mouth every 6 (six) hours as needed.  Marland Kitchen apixaban (ELIQUIS) 2.5 MG TABS tablet Take 1 tablet (2.5 mg total) by mouth 2 (two) times daily.  . Calcium Carbonate Antacid (TUMS PO) Take by mouth as needed.  . docusate sodium (COLACE) 100 MG capsule Take 100 mg by mouth daily as needed for mild constipation.  . Emollient (MEDERMA AG FACE) CREA Apply 1 application topically daily.  . Ensure (ENSURE) Take 237 mLs by mouth daily.   . furosemide (LASIX) 20 MG tablet Take 20 mg by mouth as needed for fluid.  Marland Kitchen LORazepam (ATIVAN) 1 MG tablet Take 1 mg by mouth at bedtime.  [name] Glycol-Propyl Glycol (SYSTANE OP) Apply to eye as needed.  . Probiotic Product (ALIGN PO) Take by mouth daily.  . Psyllium (METAMUCIL FIBER PO) Take by mouth daily. 1 1/2 teaspoons daily  . [DISCONTINUED] ipratropium (ATROVENT) 0.06 % nasal spray Place 1 spray into both nostrils 3 (three) times daily.  . [DISCONTINUED] Pramoxine-Ammonium Lactate (AMLACTIN AP EX) Apply 1 application topically daily.     Allergies:    Omeprazole, Flagyl [metronidazole], Amiodarone, Aripiprazole, Hylan g-f 20, Paroxetine hcl, Statins, Sulfa antibiotics, Toprol xl [metoprolol succinate], Vancomycin, and Penicillins   Social History: Social History   Socioeconomic History  . Marital status: Married    Spouse name: Not on file  . Number of children: Not on file  . Years of education: Not on file  . Highest  education level: Not on file  Occupational History  . Not on file  Tobacco Use  . Smoking status: Former Smoker    Packs/day: 1.00    Years: 20.00    Pack years: 20.00    Types: Cigarettes    Quit date: 1989    Years since quitting: 32.5  . Smokeless tobacco: Never Used  Vaping Use  . Vaping Use: Never used  Substance and Sexual Activity  . Alcohol use: No  . Drug use: No  . Sexual activity: Not Currently  Other Topics Concern  . Not on file  Social History Narrative  . Not on file   Social Determinants of Health   Financial Resource Strain:   . Difficulty of Paying Living Expenses:  Food Insecurity:   . Worried About Charity fundraiser in the Last Year:   . Arboriculturist in the Last Year:   Transportation Needs:   . Film/video editor (Medical):   Marland Kitchen Lack of Transportation (Non-Medical):   Physical Activity:   . Days of Exercise per Week:   . Minutes of Exercise per Session:   Stress:   . Feeling of Stress :   Social Connections:   . Frequency of Communication with Friends and Family:   . Frequency of Social Gatherings with Friends and Family:   . Attends Religious Services:   . Active Member of Clubs or Organizations:   . Attends Archivist Meetings:   Marland Kitchen Marital Status:      Family History: The patient's family history includes Heart failure in her father and mother; Leukemia in her mother. There is no history of Allergic rhinitis, Angioedema, Asthma, Eczema, Atopy, Immunodeficiency, or Urticaria.  ROS:   All other ROS reviewed and negative. Pertinent positives noted in the HPI.     EKGs/Labs/Other Studies Reviewed:   The following studies were personally reviewed by me today:  EKG:  EKG is ordered today.  The ekg ordered today demonstrates atrial fibrillation, BiV paced rhythm heart rate 69, and was personally reviewed by me.   TTE 03/05/2020 1. Left ventricular ejection fraction, by estimation, is 20%. The left  ventricle has severely  decreased function. The left ventricle demonstrates  regional wall motion abnormalities (see scoring diagram/findings for  description). The left ventricular  internal cavity size was moderately dilated. Left ventricular diastolic  parameters are indeterminate. Elevated left atrial pressure.  2. Right ventricular systolic function is moderately reduced. The right  ventricular size is moderately enlarged. There is mildly elevated  pulmonary artery systolic pressure.  3. Left atrial size was severely dilated.  4. Right atrial size was moderately dilated.  5. The mitral valve is degenerative. Moderate to severe mitral valve  regurgitation.  6. Tricuspid valve regurgitation is moderate.  7. The aortic valve is tricuspid. Aortic valve regurgitation is mild. No  aortic stenosis is present.  8. The inferior vena cava is dilated in size with <50% respiratory  variability, suggesting right atrial pressure of 15 mmHg.   Recent Labs: 08/20/2019: ALT 29; BUN 18; Creatinine, Ser 0.97; Hemoglobin 12.8; Platelets 194; Potassium 4.3; Sodium 137 01/07/2020: Pro B Natriuretic peptide (BNP) 569.0   Recent Lipid Panel    Component Value Date/Time   CHOL 140 01/23/2019 0631   TRIG 64 01/23/2019 0631   HDL 42 01/23/2019 0631   CHOLHDL 3.3 01/23/2019 0631   VLDL 13 01/23/2019 0631   LDLCALC 85 01/23/2019 0631    Physical Exam:   VS:  BP 109/76   Pulse 74   Ht 5' 4.5" (1.638 m)   Wt 125 lb (56.7 kg)   SpO2 97%   BMI 21.12 kg/m    Wt Readings from Last 3 Encounters:  04/18/20 125 lb (56.7 kg)  03/17/20 123 lb 12.8 oz (56.2 kg)  01/25/20 125 lb 9.6 oz (57 kg)    General: Well nourished, well developed, in no acute distress Heart: Atraumatic, normal size  Eyes: PEERLA, EOMI  Neck: Supple, JVD 8-10 cm H20, +HJR Endocrine: No thryomegaly Cardiac: Normal S1, S2; 3/6 HSM Lungs: Clear to auscultation bilaterally, no wheezing, rhonchi or rales  Abd: Soft, nontender, no hepatomegaly  Ext: 2+  LE edema  Musculoskeletal: No deformities, BUE and BLE strength normal and equal Skin:  Warm and dry, no rashes   Neuro: Alert and oriented to person, place, time, and situation, CNII-XII grossly intact, no focal deficits  Psych: Normal mood and affect   ASSESSMENT:   Carol Hardin is a 86 y.o. female who presents for the following: 1. Chronic systolic heart failure ([place])   2. Tachy-brady syndrome ([facility])   3. Permanent atrial fibrillation ([place])   4. Pacemaker   5. Nonrheumatic mitral valve regurgitation     PLAN:   1. Chronic systolic heart failure ([place]), EF 20%, LVIDD 7.0 cm, LVOT VTI 12 cm -EF on my review has never been above 40% since 2017 when she was diagnosed with Afib with RVR. EF is now 20% with severe MR. Etiology could be progression of CM as she has not been on HF medications vs valvular heart disease from MR. Her PMVL was clearly myxomatous on echocardiograms in the past and MR was moderate, if not severe then.  -There was concern for her Afib causing worsening HF but she is s/p AVN ablation with CRT-P with continued decline in EF. Afib was not the long-term problem it appears.  -She now has a severely dilated LV with severely reduced function  -I have concerns this is end-stage disease -I discussed in office that we need to try to get her on HF medications. I will go ahead and challenge her on coreg 3.125 mg BID. She she cannot tolerate we will add digoxin. We will then need to get her on ACE/ARB if possible.  -she is volume up today and will start lasix 20 mg daily for 3 days and then back off to as needed  -I will check full labs, CBC, CMP, BNP, TSH -We will need to think about her MV and if her CM is related to this. If so, this would be end-stage disease and I am unsure if even mitral clip would be of benefit.   2. Tachy-brady syndrome ([place]) 3. Permanent atrial fibrillation ([place]) 4. Pacemaker -s/p AVN ablation. Stable. Continue eliquis 2.5 mg BID (Age >80 and wt <60  kg)  5. Nonrheumatic mitral valve regurgitation -PMVL prolapse in the past. Now with severe MR. Likely has moderate to severe MR in the past due to PMVL prolapse.   Disposition: Return in about 1 month (around 05/19/2020).  Medication Adjustments/Labs and Tests Ordered: Current medicines are reviewed at length with the patient today.  Concerns regarding medicines are outlined above.  Orders Placed This Encounter  Procedures  . Comprehensive metabolic panel  . CBC  . TSH  . Brain natriuretic peptide  . EKG 12-Lead   Meds ordered this encounter  Medications  . carvedilol (COREG) 3.125 MG tablet    Sig: Take 1 tablet (3.125 mg total) by mouth 2 (two) times daily.    Dispense:  180 tablet    Refill:  3    Patient Instructions  Medication Instructions:  Take Lasix 20 mg for 3 days, then go to as needed for swelling. Start Coreg 3.125 mg twice daily   *If you need a refill on your cardiac medications before your next appointment, please call your pharmacy*  Lab: CBC, CMET, TSH, BNP-   Follow-Up: At [facility], you and your health needs are our priority.  As part of our continuing mission to provide you with exceptional heart care, we have created designated Provider Care Teams.  These Care Teams include your primary Cardiologist (physician) and Advanced Practice Providers (APPs -  Physician Assistants and Nurse Practitioners) who  all work together to provide you with the care you need, when you need it.  We recommend signing up for the patient portal called "MyChart".  Sign up information is provided on this After Visit Summary.  MyChart is used to connect with patients for Virtual Visits (Telemedicine).  Patients are able to view lab/test results, encounter notes, upcoming appointments, etc.  Non-urgent messages can be sent to your provider as well.   To learn more about what you can do with MyChart, go to [email or web address].    Your next appointment:   2-3 week(s) (okay  to overbook)  The format for your next appointment:   In Person  Provider:   Eleonore Chiquito, MD         Time Spent with Patient: I have spent a total of 35 minutes with patient reviewing hospital notes, telemetry, EKGs, labs and examining the patient as well as establishing an assessment and plan that was discussed with the patient.  > 50% of time was spent in direct patient care.  Signed, Addison Naegeli. Audie Box, [place]  [address], [place], [place] [address] [phone]  04/18/2020 8:16 PM

## 2020-04-18 NOTE — Patient Instructions (Addendum)
Medication Instructions:  Take Lasix 20 mg for 3 days, then go to as needed for swelling. Start Coreg 3.125 mg twice daily   *If you need a refill on your cardiac medications before your next appointment, please call your pharmacy*  Lab: CBC, CMET, TSH, BNP-   Follow-Up: At [facility], you and your health needs are our priority.  As part of our continuing mission to provide you with exceptional heart care, we have created designated Provider Care Teams.  These Care Teams include your primary Cardiologist (physician) and Advanced Practice Providers (APPs -  Physician Assistants and Nurse Practitioners) who all work together to provide you with the care you need, when you need it.  We recommend signing up for the patient portal called "MyChart".  Sign up information is provided on this After Visit Summary.  MyChart is used to connect with patients for Virtual Visits (Telemedicine).  Patients are able to view lab/test results, encounter notes, upcoming appointments, etc.  Non-urgent messages can be sent to your provider as well.   To learn more about what you can do with MyChart, go to [email or web address].    Your next appointment:   2-3 week(s) (okay to overbook)  The format for your next appointment:   In Person  Provider:   Eleonore Chiquito, MD

## 2020-04-21 ENCOUNTER — Telehealth: Payer: Self-pay | Admitting: Cardiovascular Disease

## 2020-04-21 NOTE — Telephone Encounter (Signed)
Pt advised that she does need labwork and they do not need to be fasting. She will have at her local [facility] in [place].

## 2020-04-21 NOTE — Telephone Encounter (Signed)
    Pt wanted to know if she can get her lab work done at [facility] in [address] in [place]. She also wanted to confirm if she needs to fast for her lab work

## 2020-04-23 ENCOUNTER — Telehealth: Payer: Self-pay | Admitting: Cardiovascular Disease

## 2020-04-23 ENCOUNTER — Encounter (HOSPITAL_COMMUNITY): Payer: Self-pay | Admitting: Physical Therapy

## 2020-04-23 ENCOUNTER — Other Ambulatory Visit: Payer: Self-pay

## 2020-04-23 ENCOUNTER — Ambulatory Visit (HOSPITAL_COMMUNITY): Payer: Medicare Other | Attending: Primary Care | Admitting: Physical Therapy

## 2020-04-23 DIAGNOSIS — M6281 Muscle weakness (generalized): Secondary | ICD-10-CM | POA: Insufficient documentation

## 2020-04-23 DIAGNOSIS — M25562 Pain in left knee: Secondary | ICD-10-CM | POA: Diagnosis present

## 2020-04-23 DIAGNOSIS — G8929 Other chronic pain: Secondary | ICD-10-CM | POA: Insufficient documentation

## 2020-04-23 DIAGNOSIS — R262 Difficulty in walking, not elsewhere classified: Secondary | ICD-10-CM | POA: Insufficient documentation

## 2020-04-23 NOTE — Patient Instructions (Signed)
walk for 2 minutes at time in the house - rest after 2 minute walk for 2 minutes or until you catch your breath - and then repeat the 2 minute walk 4 more times.- overall you walk for 10 minutes  - DO NOT PUSH THROUGH CARDIAC SYMPTOMS

## 2020-04-23 NOTE — Therapy (Signed)
[facility] [place], [place], [address] Phone: [phone]   Fax:  [phone]  Physical Therapy Treatment  Patient Details  Name: KAYLIANA CODD MRN: [id number] Date of Birth: [birth year] Referring Provider (PT): Geraldo Pitter, NP   Encounter Date: 04/23/2020   PT End of Session - 04/23/20 1457    Visit Number 8    Number of Visits 15    Date for PT Re-Evaluation 05/22/20   eval 01/23/20, PN 03/27/20   Authorization Type [employer] Medicare. No auth, No VL, Co- insurance 10%    Progress Note Due on Visit 16    PT Start Time 1458   pt late to session   PT Stop Time 1525    PT Time Calculation (min) 27 min    Equipment Utilized During Treatment Gait belt    Activity Tolerance Patient tolerated treatment well    Behavior During Therapy WFL for tasks assessed/performed           Past Medical History:  Diagnosis Date  . Anxiety   . Arthritis    "fingers, right toe" (08/09/2016)  . Basal cell carcinoma of lower leg, right   . Bradycardia    a. Holter 08/30/16 showed profound bradycardia down to 30 during awake hours, several 2 second pauses, very frequent PVCs >8000 in 48 hours, and NSVT (longest of 14 beats).  . Cardiomyopathy ([place])    a. EF 40% by echo 08/2016.  . Daily headache    "I usually wake up w/a headache; sometimes it's a migraine" (08/09/2016)  . Depression   . Diverticulosis    Hx. of  . Frequent PVCs   . GERD (gastroesophageal reflux disease)   . Hypercholesterolemia   . Migraine   . Mitral regurgitation   . MVP (mitral valve prolapse)   . NSVT (nonsustained ventricular tachycardia) ([place])    a. first noted event monitor 08/2016.  . Osteoporosis   . PAF (paroxysmal atrial fibrillation) ([place])    a. in afib at time of echo 08/2016.  Marland Kitchen Pneumonia   . Scoliosis    mild  . Squamous cell carcinoma of neck     Past Surgical History:  Procedure Laterality Date  . AV NODE ABLATION N/A 01/23/2019    Procedure: AV NODE ABLATION;  Surgeon: Thompson Grayer, MD;  Location: [facility];  Service: Cardiovascular;  Laterality: N/A;  . BASAL CELL CARCINOMA EXCISION Right    RLE  . BIV PACEMAKER INSERTION CRT-P N/A 01/23/2019   Procedure: BIV PACEMAKER INSERTION CRT-P;  Surgeon: Thompson Grayer, MD;  Location: [facility];  Service: Cardiovascular;  Laterality: N/A;  . BLEPHAROPLASTY    . BREAST BIOPSY Left   . BREAST CYST ASPIRATION Left   . BREAST CYST EXCISION Left   . BUNIONECTOMY Bilateral   . DILATION AND CURETTAGE OF UTERUS    . EXCISIONAL HEMORRHOIDECTOMY    . INTRAMEDULLARY (IM) NAIL INTERTROCHANTERIC Left 12/31/2017   Procedure: INTRAMEDULLARY (IM) NAIL INTERTROCHANTRIC;  Surgeon: Nicholes Stairs, MD;  Location: [facility];  Service: Orthopedics;  Laterality: Left;  . KNEE ARTHROSCOPY Right 04/12/2007  . KNEE ARTHROSCOPY Left   . SQUAMOUS CELL CARCINOMA EXCISION     "neck"  . TONSILLECTOMY      There were no vitals filed for this visit.   Subjective Assessment - 04/23/20 1508    Subjective States she just saw her new cardiologist. States that she felt he was a little intense. States that she  took her laxis pill three days in a row as prescribed by the MD but that she has not taken one since yesterday despite swelling in her legs today. States that she has been counting her calories and she has been able to increase her calories up to about 1700 with little Debbie cakes. States for fruits and veggies she has had jello cups with fruit and mostly canned veggies.    Pertinent History CHF, Shortness of breath, hosteoporosis, depression    Limitations Walking;Standing;House hold activities    Patient Stated Goals to be able to walk around the block    Currently in Pain? No/denies              [facility] PT Assessment - 04/23/20 0001      Observation/Other Assessments-Edema    Edema --   pitting edema bilaterally 4+, redeness and warmth in B LE     Ambulation/Gait    Ambulation/Gait Yes    Ambulation/Gait Assistance 5: Supervision    Ambulation Distance (Feet) 250 Feet    Gait Pattern Decreased arm swing - right;Decreased arm swing - left;Decreased step length - right;Decreased step length - left;Decreased hip/knee flexion - left;Trunk flexed    Gait velocity decreased    Gait Comments 2MW- 4 minute rest break - 2 sets            Educated patient and reviewed walking program for home.                      PT Education - 04/23/20 1511    Education Details educated patient in walking program to improve functional endurance. Discussed walking at home with timer. Reviewed walking program in clinic today. Discussed nutritional needs and continued increase in calories and food variety.    Person(s) Educated Patient    Methods Explanation    Comprehension Verbalized understanding            PT Short Term Goals - 03/27/20 1456      PT SHORT TERM GOAL #1   Title Patient will be independent in self management strategies to improve quality of life and functional outcomes.    Time 3    Period Weeks    Status On-going    Target Date 02/13/20      PT SHORT TERM GOAL #2   Title Patient will report at least 25% improvement in overall symptoms and/or functional ability.    Baseline 6/17 100% better in breathing techniques, and this has helped with walking but she is still weak    Time 3    Period Weeks    Status Achieved    Target Date 02/13/20             PT Long Term Goals - 03/27/20 1500      PT LONG TERM GOAL #1   Title Patient will report at least 50% improvement in overall symptoms and/or functional ability.    Time 6    Period Weeks    Status Achieved      PT LONG TERM GOAL #2   Title Patient will be able to walk at least 5 minutes without sititng down to rest to improve ability to walk around neighborhood.    Baseline 6/17 30 minutes and then she needs to sit down    Time 6    Period Weeks    Status Achieved       PT LONG TERM GOAL #3   Title Patient will be able  to stand on either leg for at least 30 seconds to demonstrate improved static balance.    Baseline L 20 seconds and R 6 seconds    Time 6    Period Weeks    Status On-going      PT LONG TERM GOAL #4   Title Patient will report eating at least 1200 calories/day regularly to improve ability to maintain weight and energy level.    Time 8    Period Weeks    Status New    Target Date 05/22/20      PT LONG TERM GOAL #5   Title Patient will report she has started to walk around the neighborhood to improve ability to perform community walking program.    Time 8    Target Date 05/22/20                 Plan - 04/23/20 1457    Clinical Impression Statement Patient continues to have decreased endurance during session. two minute walk test revealed patient had reduction in overall length she was able to walk in two minutes since the start of PT. Focused on education. Improvement noted in overall calorie intake but continued to encourage patient to consume more fruits and veggies to increase nutrients. Patient verbalized understanding.    Personal Factors and Comorbidities Age;Comorbidity 1;Comorbidity 2;Comorbidity 3+    Comorbidities pacemaker, afib, diverticulitis, recent weight loss, stroke, shortness of breath    Examination-Activity Limitations Bathing;Bend;Stand;Stairs;Squat    Examination-Participation Restrictions Cleaning;Community Activity;Laundry;Meal Prep    Stability/Clinical Decision Making Evolving/Moderate complexity    Rehab Potential Good    PT Frequency Other (comment)   3 visits over 8 week certificaiton period   PT Duration 8 weeks    PT Treatment/Interventions ADLs/Self Care Home Management;Aquatic Therapy;Cryotherapy;Electrical Stimulation;DME Instruction;Traction;Moist Heat;Gait training;Stair training;Functional mobility training;Therapeutic activities;Therapeutic exercise;Balance training;Patient/family  education;Neuromuscular re-education;Dry needling;Energy conservation;Passive range of motion    PT Next Visit Plan monitor BP, standing balance and strengthening, SLS, squats, standing exercises, nutrition education/management    PT Home Exercise Plan 4/22 lateral stepping; 5/6 hip ext, abd. LAQs; 5/13 TRA activation, pelvic tilts supine, deep breathing. 6/17 walking program; 6/30 W's    Consulted and Agree with Plan of Care Patient           Patient will benefit from skilled therapeutic intervention in order to improve the following deficits and impairments:  Decreased activity tolerance, Decreased balance, Decreased mobility, Decreased strength, Increased edema, Pain, Decreased endurance, Abnormal gait, Decreased range of motion, Difficulty walking  Visit Diagnosis: Difficulty in walking, not elsewhere classified  Muscle weakness (generalized)  Chronic pain of left knee     Problem List Patient Active Problem List   Diagnosis Date Noted  . Muscular deconditioning 01/07/2020  . Dysphagia 03/30/2019  . Constipation 03/30/2019  . Pacemaker   . Chronic respiratory failure with hypoxia ([place])   . On home O2   . Sick sinus syndrome ([place]) 01/22/2019  . CKD (chronic kidney disease) stage 3, GFR 30-59 ml/min 01/21/2019  . Atrial fibrillation with RVR ([place]) 01/20/2019  . Acute on chronic systolic CHF (congestive heart failure) ([place]) 12/25/2018  . Tachy-brady syndrome ([place]) 12/25/2018  . Atrial fibrillation, chronic ([place]) 12/24/2018  . Congestive heart failure with left ventricular diastolic dysfunction, unspecified failure chronicity ([place]) 12/24/2018  . Malnutrition of moderate degree 11/20/2018  . Atrial fibrillation with rapid ventricular response ([place]) 11/18/2018  . GERD (gastroesophageal reflux disease) 11/18/2018  . CAP (community acquired pneumonia) 11/18/2018  . Acute on chronic diastolic  HF (heart failure) ([place]) 11/18/2018  . Stroke (cerebrum) ([place]) 05/19/2018  . Hip  fracture ([place]) 12/31/2017  . PAF (paroxysmal atrial fibrillation) ([place])   . NSVT (nonsustained ventricular tachycardia) ([place])   . Mitral regurgitation   . Frequent PVCs   . Bradycardia   . Essential hypertension   . Hypokalemia   . Diverticulitis 08/09/2016  . Diverticulosis of colon 04/22/2016  . Diverticulosis of large intestine without perforation or abscess without bleeding 04/22/2016  . Generalized anxiety disorder 04/22/2016  . Irritable bowel syndrome without diarrhea 04/22/2016  . Mitral valve prolapse 04/22/2016  . Mixed hyperlipidemia 04/22/2016  . Osteopenia 04/22/2016  . Low blood pressure 09/24/2014  . Hypercholesterolemia   . History of depression   . Scoliosis   . Osteoporosis   . DOE (dyspnea on exertion) 05/31/2008   3:29 PM, 04/23/20 Jerene Pitch, DPT Physical Therapy with [facility]  [phone] office  [facility] [address] [place], [place], [address] Phone: [phone]   Fax:  [phone]  Name: TIARRAH SAVILLE MRN: [id number] Date of Birth: [birth year]

## 2020-04-23 NOTE — Telephone Encounter (Signed)
New Message:   Pt wanted Dr Audie Box to know she did not have her lab work yesterday, she had it today. [facility] said they would sent the results over tomorrow,

## 2020-04-23 NOTE — Telephone Encounter (Signed)
Noted. Will be on the look out for lab results.

## 2020-04-24 LAB — CBC
Hematocrit: 41.2 % (ref 34.0–46.6)
Hemoglobin: 14 g/dL (ref 11.1–15.9)
MCH: 33.7 pg — ABNORMAL HIGH (ref 26.6–33.0)
MCHC: 34 g/dL (ref 31.5–35.7)
MCV: 99 fL — ABNORMAL HIGH (ref 79–97)
Platelets: 198 10*3/uL (ref 150–450)
RBC: 4.15 x10E6/uL (ref 3.77–5.28)
RDW: 12.4 % (ref 11.7–15.4)
WBC: 7.4 10*3/uL (ref 3.4–10.8)

## 2020-04-24 LAB — COMPREHENSIVE METABOLIC PANEL
ALT: 21 IU/L (ref 0–32)
AST: 35 IU/L (ref 0–40)
Albumin/Globulin Ratio: 1.3 (ref 1.2–2.2)
Albumin: 4.1 g/dL (ref 3.6–4.6)
Alkaline Phosphatase: 61 IU/L (ref 48–121)
BUN/Creatinine Ratio: 22 (ref 12–28)
BUN: 20 mg/dL (ref 8–27)
Bilirubin Total: 0.6 mg/dL (ref 0.0–1.2)
CO2: 24 mmol/L (ref 20–29)
Calcium: 9.3 mg/dL (ref 8.7–10.3)
Chloride: 103 mmol/L (ref 96–106)
Creatinine, Ser: 0.89 mg/dL (ref 0.57–1.00)
GFR calc Af Amer: 69 mL/min/{1.73_m2} (ref >59)
GFR calc non Af Amer: 60 mL/min/{1.73_m2} (ref >59)
Globulin, Total: 3.2 g/dL (ref 1.5–4.5)
Glucose: 76 mg/dL (ref 65–99)
Potassium: 4.9 mmol/L (ref 3.5–5.2)
Sodium: 140 mmol/L (ref 134–144)
Total Protein: 7.3 g/dL (ref 6.0–8.5)

## 2020-04-24 LAB — TSH: TSH: 5.22 u[IU]/mL — ABNORMAL HIGH (ref 0.450–4.500)

## 2020-04-24 LAB — BRAIN NATRIURETIC PEPTIDE: BNP: 444.6 pg/mL — ABNORMAL HIGH (ref 0.0–100.0)

## 2020-04-25 ENCOUNTER — Ambulatory Visit: Payer: Medicare Other | Admitting: Cardiology

## 2020-04-28 ENCOUNTER — Ambulatory Visit: Payer: Medicare Other | Admitting: Internal Medicine

## 2020-04-28 ENCOUNTER — Other Ambulatory Visit: Payer: Self-pay

## 2020-04-28 ENCOUNTER — Encounter: Payer: Self-pay | Admitting: Internal Medicine

## 2020-04-28 VITALS — BP 100/70 | HR 72 | Ht 64.0 in | Wt 125.4 lb

## 2020-04-28 DIAGNOSIS — Z950 Presence of cardiac pacemaker: Secondary | ICD-10-CM

## 2020-04-28 DIAGNOSIS — I447 Left bundle-branch block, unspecified: Secondary | ICD-10-CM

## 2020-04-28 DIAGNOSIS — I4819 Other persistent atrial fibrillation: Secondary | ICD-10-CM

## 2020-04-28 DIAGNOSIS — I495 Sick sinus syndrome: Secondary | ICD-10-CM

## 2020-04-28 DIAGNOSIS — I428 Other cardiomyopathies: Secondary | ICD-10-CM | POA: Diagnosis not present

## 2020-04-28 DIAGNOSIS — R001 Bradycardia, unspecified: Secondary | ICD-10-CM

## 2020-04-28 DIAGNOSIS — D6869 Other thrombophilia: Secondary | ICD-10-CM

## 2020-04-28 NOTE — Progress Notes (Signed)
PCP: Celene Squibb, MD Primary Cardiologist: Dr Marisue Ivan Primary EP:  Dr Rayann Heman  Carol Hardin is a 86 y.o. female who presents today for routine electrophysiology followup.  Since last being seen in our clinic, the patient reports doing reasonably well.  She is very active for her age, with minimal limitations.  Occasional SOB.  + dependant edema.  Today, she denies symptoms of palpitations, chest pain,  dizziness, presyncope, or syncope.  The patient is otherwise without complaint today.   Past Medical History:  Diagnosis Date  . Anxiety   . Arthritis    "fingers, right toe" (08/09/2016)  . Basal cell carcinoma of lower leg, right   . Bradycardia    a. Holter 08/30/16 showed profound bradycardia down to 30 during awake hours, several 2 second pauses, very frequent PVCs >8000 in 48 hours, and NSVT (longest of 14 beats).  . Cardiomyopathy ([place])    a. EF 40% by echo 08/2016.  . Daily headache    "I usually wake up w/a headache; sometimes it's a migraine" (08/09/2016)  . Depression   . Diverticulosis    Hx. of  . Frequent PVCs   . GERD (gastroesophageal reflux disease)   . Hypercholesterolemia   . Migraine   . Mitral regurgitation   . MVP (mitral valve prolapse)   . NSVT (nonsustained ventricular tachycardia) ([place])    a. first noted event monitor 08/2016.  . Osteoporosis   . PAF (paroxysmal atrial fibrillation) ([place])    a. in afib at time of echo 08/2016.  Marland Kitchen Pneumonia   . Scoliosis    mild  . Squamous cell carcinoma of neck    Past Surgical History:  Procedure Laterality Date  . AV NODE ABLATION N/A 01/23/2019   Procedure: AV NODE ABLATION;  Surgeon: Thompson Grayer, MD;  Location: [facility];  Service: Cardiovascular;  Laterality: N/A;  . BASAL CELL CARCINOMA EXCISION Right    RLE  . BIV PACEMAKER INSERTION CRT-P N/A 01/23/2019   Procedure: BIV PACEMAKER INSERTION CRT-P;  Surgeon: Thompson Grayer, MD;  Location: [facility];  Service: Cardiovascular;   Laterality: N/A;  . BLEPHAROPLASTY    . BREAST BIOPSY Left   . BREAST CYST ASPIRATION Left   . BREAST CYST EXCISION Left   . BUNIONECTOMY Bilateral   . DILATION AND CURETTAGE OF UTERUS    . EXCISIONAL HEMORRHOIDECTOMY    . INTRAMEDULLARY (IM) NAIL INTERTROCHANTERIC Left 12/31/2017   Procedure: INTRAMEDULLARY (IM) NAIL INTERTROCHANTRIC;  Surgeon: Nicholes Stairs, MD;  Location: [facility];  Service: Orthopedics;  Laterality: Left;  . KNEE ARTHROSCOPY Right 04/12/2007  . KNEE ARTHROSCOPY Left   . SQUAMOUS CELL CARCINOMA EXCISION     "neck"  . TONSILLECTOMY      ROS- all systems are reviewed and negative except as per HPI above  Current Outpatient Medications  Medication Sig Dispense Refill  . apixaban (ELIQUIS) 2.5 MG TABS tablet Take 1 tablet (2.5 mg total) by mouth 2 (two) times daily. 60 tablet 6  . aspirin 325 MG tablet Take 325 mg by mouth daily.    . Calcium Carbonate Antacid (TUMS PO) Take by mouth as needed.    . carvedilol (COREG) 3.125 MG tablet Take 1 tablet (3.125 mg total) by mouth 2 (two) times daily. 180 tablet 3  . docusate sodium (COLACE) 100 MG capsule Take 100 mg by mouth daily as needed for mild constipation.    . Emollient (MEDERMA AG FACE) CREA Apply 1 application  topically daily.    . Ensure (ENSURE) Take 237 mLs by mouth daily.     . furosemide (LASIX) 20 MG tablet Take 20 mg by mouth as needed for fluid.    Marland Kitchen LORazepam (ATIVAN) 1 MG tablet Take 1 mg by mouth at bedtime.    [name] Glycol-Propyl Glycol (SYSTANE OP) Apply to eye as needed.    . Probiotic Product (ALIGN PO) Take by mouth daily.    . Psyllium (METAMUCIL FIBER PO) Take by mouth daily. 1 1/2 teaspoons daily     No current facility-administered medications for this visit.    Physical Exam: Vitals:   04/28/20 1354  BP: 100/70  Pulse: 72  SpO2: 96%  Weight: 125 lb 6.4 oz (56.9 kg)  Height: 5\' 4"  (1.626 m)    GEN- The patient is elderly and anxious appearing, alert and oriented x 3  today.   Head- normocephalic, atraumatic Eyes-  Sclera clear, conjunctiva pink Ears- hearing intact Oropharynx- clear Lungs-   normal work of breathing Chest- pacemaker pocket is well healed Heart- Regular rate and rhythm (paced) GI- soft  Extremities- no clubbing, cyanosis, +1 edema  Pacemaker interrogation- reviewed in detail today,  See PACEART report   Assessment and Plan:  1. Symptomatic complete heart block Normal pacemaker function See Pace Art report No changes today Consider reprogramming VVIR on return (she has been reluctant to do this in the past) she is device dependant today  2. Permanent afib Rate controlled On renally dosed eliquis  3. Nonischemic CM Continues to have reduced EF despite CRT Medical therapy per general cardiology [place] is normal today   Return in a year  Thompson Grayer MD, [facility] 04/28/2020 2:22 PM

## 2020-04-28 NOTE — Patient Instructions (Signed)
Medication Instructions:  Your physician recommends that you continue on your current medications as directed. Please refer to the Current Medication list given to you today.  *If you need a refill on your cardiac medications before your next appointment, please call your pharmacy*  Lab Work: None ordered.  If you have labs (blood work) drawn today and your tests are completely normal, you will receive your results only by: Marland Kitchen MyChart Message (if you have MyChart) OR . A paper copy in the mail If you have any lab test that is abnormal or we need to change your treatment, we will call you to review the results.  Testing/Procedures: None ordered.  Follow-Up: At [facility], you and your health needs are our priority.  As part of our continuing mission to provide you with exceptional heart care, we have created designated Provider Care Teams.  These Care Teams include your primary Cardiologist (physician) and Advanced Practice Providers (APPs -  Physician Assistants and Nurse Practitioners) who all work together to provide you with the care you need, when you need it.  We recommend signing up for the patient portal called "MyChart".  Sign up information is provided on this After Visit Summary.  MyChart is used to connect with patients for Virtual Visits (Telemedicine).  Patients are able to view lab/test results, encounter notes, upcoming appointments, etc.  Non-urgent messages can be sent to your provider as well.   To learn more about what you can do with MyChart, go to [email or web address].    Your next appointment:   Your physician wants you to follow-up in: 1 year with Dr. Rayann Heman. You will receive a reminder letter in the mail two months in advance. If you don't receive a letter, please call our office to schedule the follow-up appointment.  Remote monitoring is used to monitor your Pacemaker from home. This monitoring reduces the number of office visits required to check your device  to one time per year. It allows [place] to keep an eye on the functioning of your device to ensure it is working properly. You are scheduled for a device check from home on 05/12/20. You may send your transmission at any time that day. If you have a wireless device, the transmission will be sent automatically. After your physician reviews your transmission, you will receive a postcard with your next transmission date.  Other Instructions:

## 2020-04-29 ENCOUNTER — Telehealth: Payer: Self-pay | Admitting: Cardiovascular Disease

## 2020-04-29 NOTE — Telephone Encounter (Signed)
Patient was called- given lab results.  Patient verbalized understanding.

## 2020-04-29 NOTE — Telephone Encounter (Signed)
Patient is requesting to schedule 2 week follow up with Dr. Audie Box per follow up instructions. However, I am unable to overbook for Dr. Audie Box. Please call to assist with scheduling.

## 2020-04-29 NOTE — Telephone Encounter (Signed)
Hey! She just needs an appointment around August 9th, or somewhere that week if possible or week after if he has spots.  Thank you!

## 2020-05-05 ENCOUNTER — Encounter: Payer: Medicare Other | Admitting: Internal Medicine

## 2020-05-07 ENCOUNTER — Encounter (HOSPITAL_COMMUNITY): Payer: Self-pay

## 2020-05-09 ENCOUNTER — Telehealth: Payer: Self-pay | Admitting: Internal Medicine

## 2020-05-09 NOTE — Telephone Encounter (Signed)
New message   Pt is calling to find out what medication she can take for headaches besides tylenol.

## 2020-05-12 ENCOUNTER — Ambulatory Visit (INDEPENDENT_AMBULATORY_CARE_PROVIDER_SITE_OTHER): Payer: Medicare Other | Admitting: *Deleted

## 2020-05-12 DIAGNOSIS — I495 Sick sinus syndrome: Secondary | ICD-10-CM

## 2020-05-12 DIAGNOSIS — I5033 Acute on chronic diastolic (congestive) heart failure: Secondary | ICD-10-CM

## 2020-05-12 LAB — CUP PACEART REMOTE DEVICE CHECK
Battery Remaining Longevity: 74 mo
Battery Remaining Percentage: 95.5 %
Battery Voltage: 2.99 V
Brady Statistic AP VP Percent: 0 %
Brady Statistic AP VS Percent: 0 %
Brady Statistic AS VP Percent: 0 %
Brady Statistic AS VS Percent: 0 %
Brady Statistic RA Percent Paced: 1 %
Date Time Interrogation Session: 20210802020010
Implantable Lead Implant Date: 20200414
Implantable Lead Implant Date: 20200414
Implantable Lead Implant Date: 20200414
Implantable Lead Location: 753858
Implantable Lead Location: 753859
Implantable Lead Location: 753860
Implantable Pulse Generator Implant Date: 20200414
Lead Channel Impedance Value: 450 Ohm
Lead Channel Impedance Value: 460 Ohm
Lead Channel Impedance Value: 780 Ohm
Lead Channel Pacing Threshold Amplitude: 0.5 V
Lead Channel Pacing Threshold Amplitude: 1 V
Lead Channel Pacing Threshold Pulse Width: 0.5 ms
Lead Channel Pacing Threshold Pulse Width: 0.7 ms
Lead Channel Sensing Intrinsic Amplitude: 1.9 mV
Lead Channel Sensing Intrinsic Amplitude: 10.1 mV
Lead Channel Setting Pacing Amplitude: 2.5 V
Lead Channel Setting Pacing Amplitude: 2.5 V
Lead Channel Setting Pacing Amplitude: 3.5 V
Lead Channel Setting Pacing Pulse Width: 0.5 ms
Lead Channel Setting Pacing Pulse Width: 0.7 ms
Lead Channel Setting Sensing Sensitivity: 3 mV
Pulse Gen Model: 3562
Pulse Gen Serial Number: 9431558

## 2020-05-12 NOTE — Telephone Encounter (Signed)
Spoke to the patient and found that she has been taking 325mg  of tylenol for headaches.  Advised the patient to increase her tylenol up to a max of 1000mg  per dose q 6 hours with a max dose of 4000mg  in 24 hours.   Also recommended using some relaxation techniques.    Advised if the headaches worsen or not get any better to contact her PCP for further recommendations.

## 2020-05-13 ENCOUNTER — Telehealth (HOSPITAL_COMMUNITY): Payer: Self-pay | Admitting: Physical Therapy

## 2020-05-13 NOTE — Telephone Encounter (Signed)
pt cancelled appt because she thinks she has been exposed to a neighor whos family member has COVID

## 2020-05-13 NOTE — Progress Notes (Signed)
Remote pacemaker transmission.   

## 2020-05-14 ENCOUNTER — Ambulatory Visit (HOSPITAL_COMMUNITY): Payer: Self-pay | Admitting: Physical Therapy

## 2020-05-15 LAB — CUP PACEART INCLINIC DEVICE CHECK
Date Time Interrogation Session: 20210719163510
Implantable Lead Implant Date: 20200414
Implantable Lead Implant Date: 20200414
Implantable Lead Implant Date: 20200414
Implantable Lead Location: 753858
Implantable Lead Location: 753859
Implantable Lead Location: 753860
Implantable Pulse Generator Implant Date: 20200414
Lead Channel Pacing Threshold Amplitude: 0.5 V
Lead Channel Pacing Threshold Amplitude: 1 V
Lead Channel Pacing Threshold Pulse Width: 0.5 ms
Lead Channel Pacing Threshold Pulse Width: 0.7 ms
Lead Channel Sensing Intrinsic Amplitude: 1.9 mV
Lead Channel Sensing Intrinsic Amplitude: 6.6 mV
Pulse Gen Model: 3562
Pulse Gen Serial Number: 9431558

## 2020-06-03 ENCOUNTER — Telehealth: Payer: Self-pay | Admitting: Internal Medicine

## 2020-06-03 NOTE — Telephone Encounter (Signed)
New message   Pt called stating that she is having a procedure on her eyes. Is it ok to keep taking her eliquis? There is no incision or anything. She said she will also be on a medication afterward that is an eye drop. It's an anti inflammatory. Fluromethalon. She wanted Dr. Rayann Heman to be aware.

## 2020-06-13 ENCOUNTER — Ambulatory Visit (HOSPITAL_COMMUNITY): Payer: Medicare Other | Attending: Primary Care | Admitting: Physical Therapy

## 2020-06-13 ENCOUNTER — Encounter (HOSPITAL_COMMUNITY): Payer: Self-pay | Admitting: Physical Therapy

## 2020-06-13 ENCOUNTER — Other Ambulatory Visit: Payer: Self-pay

## 2020-06-13 DIAGNOSIS — M6281 Muscle weakness (generalized): Secondary | ICD-10-CM | POA: Insufficient documentation

## 2020-06-13 DIAGNOSIS — R262 Difficulty in walking, not elsewhere classified: Secondary | ICD-10-CM | POA: Insufficient documentation

## 2020-06-13 NOTE — Therapy (Signed)
[place], [place], [address] Phone: [phone]   Fax:  [phone]  Physical Therapy Treatment and Discharge   Patient Details  Name: Carol Hardin MRN: [id number] Date of Birth: 08/11/1933 Referring Provider (PT): Geraldo Pitter, NP  PHYSICAL THERAPY DISCHARGE SUMMARY  Visits from Start of Care:9  Current functional level related to goals / functional outcomes: See below    Remaining deficits: Continued strength defecits   Education / Equipment: See below Plan: Patient agrees to discharge.  Patient goals were partially met. Patient is being discharged due to lack of progress.  ?????       Encounter Date: 06/13/2020   PT End of Session - 06/13/20 1206    Visit Number 9    Number of Visits 15    Date for PT Re-Evaluation 05/22/20   eval 01/23/20, PN 03/27/20   Authorization Type [employer] Medicare. No auth, No VL, Co- insurance 10%    Progress Note Due on Visit 16    PT Start Time 1140    PT Stop Time 1210    PT Time Calculation (min) 30 min    Equipment Utilized During Treatment Gait belt    Activity Tolerance Patient tolerated treatment well    Behavior During Therapy WFL for tasks assessed/performed           Past Medical History:  Diagnosis Date  . Anxiety   . Arthritis    "fingers, right toe" (08/09/2016)  . Basal cell carcinoma of lower leg, right   . Bradycardia    a. Holter 08/30/16 showed profound bradycardia down to 30 during awake hours, several 2 second pauses, very frequent PVCs >8000 in 48 hours, and NSVT (longest of 14 beats).  . Cardiomyopathy ([place])    a. EF 40% by echo 08/2016.  . Daily headache    "I usually wake up w/a headache; sometimes it's a migraine" (08/09/2016)  . Depression   . Diverticulosis    Hx. of  . Frequent PVCs   . GERD (gastroesophageal reflux disease)   . Hypercholesterolemia   . Migraine   . Mitral regurgitation   . MVP (mitral valve prolapse)   .  NSVT (nonsustained ventricular tachycardia) ([place])    a. first noted event monitor 08/2016.  . Osteoporosis   . PAF (paroxysmal atrial fibrillation) ([place])    a. in afib at time of echo 08/2016.  Marland Kitchen Pneumonia   . Scoliosis    mild  . Squamous cell carcinoma of neck     Past Surgical History:  Procedure Laterality Date  . AV NODE ABLATION N/A 01/23/2019   Procedure: AV NODE ABLATION;  Surgeon: Thompson Grayer, MD;  Location: [facility];  Service: Cardiovascular;  Laterality: N/A;  . BASAL CELL CARCINOMA EXCISION Right    RLE  . BIV PACEMAKER INSERTION CRT-P N/A 01/23/2019   Procedure: BIV PACEMAKER INSERTION CRT-P;  Surgeon: Thompson Grayer, MD;  Location: [facility];  Service: Cardiovascular;  Laterality: N/A;  . BLEPHAROPLASTY    . BREAST BIOPSY Left   . BREAST CYST ASPIRATION Left   . BREAST CYST EXCISION Left   . BUNIONECTOMY Bilateral   . DILATION AND CURETTAGE OF UTERUS    . EXCISIONAL HEMORRHOIDECTOMY    . INTRAMEDULLARY (IM) NAIL INTERTROCHANTERIC Left 12/31/2017   Procedure: INTRAMEDULLARY (IM) NAIL INTERTROCHANTRIC;  Surgeon: Nicholes Stairs, MD;  Location: [facility];  Service: Orthopedics;  Laterality: Left;  . KNEE ARTHROSCOPY Right 04/12/2007  .  KNEE ARTHROSCOPY Left   . SQUAMOUS CELL CARCINOMA EXCISION     "neck"  . TONSILLECTOMY      There were no vitals filed for this visit.   Subjective Assessment - 06/13/20 1146    Subjective States she has had a headache this morning. States she has not been able to get rid of it so she took another Tylenol. States she has been doing a lot of yard work. States she has been doing some of the exercises. States she has been eating more calories she thinks and has been having some frozen meals for diversity. States that she hasn't been recording her calories. Reports she has been having the ensure.    Pertinent History CHF, Shortness of breath, hosteoporosis, depression    Limitations Walking;Standing;House hold  activities    Patient Stated Goals to be able to walk around the block              [facility] PT Assessment - 06/13/20 0001      Assessment   Medical Diagnosis Deconditioning    Referring Provider (PT) Geraldo Pitter, NP      Ambulation/Gait   Ambulation/Gait Yes    Ambulation/Gait Assistance 5: Supervision    Ambulation Distance (Feet) 190 Feet    Assistive device None    Gait Pattern Decreased stride length;Decreased weight shift to right;Decreased step length - left;Decreased step length - right    Gait velocity decreased            [facility] Adult PT Treatment/Exercise - 06/13/20 0001      Ambulation/Gait   Gait Comments 2MW - headache - veared  to the right, then walking with cane - one whole lap with cane in left hand                   PT Education - 06/13/20 1206    Education Details Educated patient in use of cane in left hand, balance concerns. Discussed patient's concerns about needing a new MD as she is unsatisfied with current level of care. Answered all questions and discussed nutrition and continued monitoring of calories.    Person(s) Educated Patient    Methods Explanation    Comprehension Verbalized understanding            PT Short Term Goals - 03/27/20 1456      PT SHORT TERM GOAL #1   Title Patient will be independent in self management strategies to improve quality of life and functional outcomes.    Time 3    Period Weeks    Status On-going    Target Date 02/13/20      PT SHORT TERM GOAL #2   Title Patient will report at least 25% improvement in overall symptoms and/or functional ability.    Baseline 6/17 100% better in breathing techniques, and this has helped with walking but she is still weak    Time 3    Period Weeks    Status Achieved    Target Date 02/13/20             PT Long Term Goals - 06/13/20 1208      PT LONG TERM GOAL #1   Title Patient will report at least 50% improvement in overall symptoms and/or functional  ability.    Time 6    Period Weeks    Status Achieved      PT LONG TERM GOAL #2   Title Patient will be able to walk at least 5  minutes without sititng down to rest to improve ability to walk around neighborhood.    Baseline 6/17 30 minutes and then she needs to sit down    Time 6    Period Weeks    Status Achieved      PT LONG TERM GOAL #3   Title Patient will be able to stand on either leg for at least 30 seconds to demonstrate improved static balance.    Baseline L 20 seconds and R 6 seconds    Time 6    Period Weeks    Status On-going      PT LONG TERM GOAL #4   Title Patient will report eating at least 1200 calories/day regularly to improve ability to maintain weight and energy level.    Baseline states she is    Time 8    Period Weeks    Status Achieved      PT LONG TERM GOAL #5   Title Patient will report she has started to walk around the neighborhood to improve ability to perform community walking program.    Baseline has not tried    Time 8    Status On-going                 Plan - 06/13/20 1210    Clinical Impression Statement Attempted to review HEP and assess waking but patient reported headache limiting her in today's participation. Focused on review of breathing exercises secondary to patient request and answered all questions and addressed patient concerns. Educated patient on use of cane with walking secondary to tendency to lean to right with walking and bump into walls. Patient verbalized agreement and able to demonstrate good mechanics with cane while in clinic. Patient has made some progress, but has multiple health issues that she is more concerned about and needs to focus on at this time. Additionally adherence to HEP was fair to moderate and overall progress has plateaued.  Patient to discharge from PT at this time to HEP.    Personal Factors and Comorbidities Age;Comorbidity 1;Comorbidity 2;Comorbidity 3+    Comorbidities pacemaker, afib,  diverticulitis, recent weight loss, stroke, shortness of breath    Examination-Activity Limitations Bathing;Bend;Stand;Stairs;Squat    Examination-Participation Restrictions Cleaning;Community Activity;Laundry;Meal Prep    Stability/Clinical Decision Making Evolving/Moderate complexity    Rehab Potential Good    PT Frequency Other (comment)   3 visits over 8 week certificaiton period   PT Duration 8 weeks    PT Treatment/Interventions ADLs/Self Care Home Management;Aquatic Therapy;Cryotherapy;Electrical Stimulation;DME Instruction;Traction;Moist Heat;Gait training;Stair training;Functional mobility training;Therapeutic activities;Therapeutic exercise;Balance training;Patient/family education;Neuromuscular re-education;Dry needling;Energy conservation;Passive range of motion    PT Next Visit Plan pt to DC to HEP    PT Home Exercise Plan 4/22 lateral stepping; 5/6 hip ext, abd. LAQs; 5/13 TRA activation, pelvic tilts supine, deep breathing. 6/17 walking program; 6/30 W's    Consulted and Agree with Plan of Care Patient           Patient will benefit from skilled therapeutic intervention in order to improve the following deficits and impairments:  Decreased activity tolerance, Decreased balance, Decreased mobility, Decreased strength, Increased edema, Pain, Decreased endurance, Abnormal gait, Decreased range of motion, Difficulty walking  Visit Diagnosis: Difficulty in walking, not elsewhere classified  Muscle weakness (generalized)     Problem List Patient Active Problem List   Diagnosis Date Noted  . Muscular deconditioning 01/07/2020  . Dysphagia 03/30/2019  . Constipation 03/30/2019  . Pacemaker   . Chronic respiratory failure with hypoxia (  [place])   . On home O2   . Sick sinus syndrome ([place]) 01/22/2019  . CKD (chronic kidney disease) stage 3, GFR 30-59 ml/min 01/21/2019  . Atrial fibrillation with RVR ([place]) 01/20/2019  . Acute on chronic systolic CHF (congestive heart failure)  ([place]) 12/25/2018  . Tachy-brady syndrome ([place]) 12/25/2018  . Atrial fibrillation, chronic ([place]) 12/24/2018  . Congestive heart failure with left ventricular diastolic dysfunction, unspecified failure chronicity ([place]) 12/24/2018  . Malnutrition of moderate degree 11/20/2018  . Atrial fibrillation with rapid ventricular response ([place]) 11/18/2018  . GERD (gastroesophageal reflux disease) 11/18/2018  . CAP (community acquired pneumonia) 11/18/2018  . Acute on chronic diastolic HF (heart failure) ([place]) 11/18/2018  . Stroke (cerebrum) ([place]) 05/19/2018  . Hip fracture ([place]) 12/31/2017  . PAF (paroxysmal atrial fibrillation) ([place])   . NSVT (nonsustained ventricular tachycardia) ([place])   . Mitral regurgitation   . Frequent PVCs   . Bradycardia   . Essential hypertension   . Hypokalemia   . Diverticulitis 08/09/2016  . Diverticulosis of colon 04/22/2016  . Diverticulosis of large intestine without perforation or abscess without bleeding 04/22/2016  . Generalized anxiety disorder 04/22/2016  . Irritable bowel syndrome without diarrhea 04/22/2016  . Mitral valve prolapse 04/22/2016  . Mixed hyperlipidemia 04/22/2016  . Osteopenia 04/22/2016  . Low blood pressure 09/24/2014  . Hypercholesterolemia   . History of depression   . Scoliosis   . Osteoporosis   . DOE (dyspnea on exertion) 05/31/2008   1:34 PM, 06/13/20 Jerene Pitch, DPT Physical Therapy with [facility]  [phone] office  [facility] [address] [place], [place], [address] Phone: [phone]   Fax:  [phone]  Name: Carol Hardin MRN: [id number] Date of Birth: 1933/06/11

## 2020-06-18 ENCOUNTER — Other Ambulatory Visit: Payer: Self-pay | Admitting: Student

## 2020-06-19 NOTE — Telephone Encounter (Signed)
New message   *STAT* If patient is at the pharmacy, call can be transferred to refill team.   1. Which medications need to be refilled? (please list name of each medication and dose if known) eliquis 2.5 mg  2. Which pharmacy/location (including street and city if local pharmacy) is medication to be sent to? [facility]   3. Do they need a 30 day or 90 day supply? 30 day

## 2020-06-19 NOTE — Telephone Encounter (Signed)
Eliquis 2.5mg  refill request received. Patient is 86 years old, weight-56.9kg, Crea-0.89 on 04/23/2020, Diagnosis-Afib, and last seen by Dr. Rayann Heman on 04/28/2020. Dose is appropriate based on dosing criteria. Will send in refill to requested pharmacy.

## 2020-06-20 IMAGING — DX DG CHEST 2V
2 series · 2 of 2 positions shown · non-contrast
Comparison: Chest x-ray 10/18/2018.

CLINICAL DATA: 85-year-old female with history of chest tightness
and shortness of breath for 1 week, progressively worsening.

EXAM:
CHEST - 2 VIEW

[chest pa]
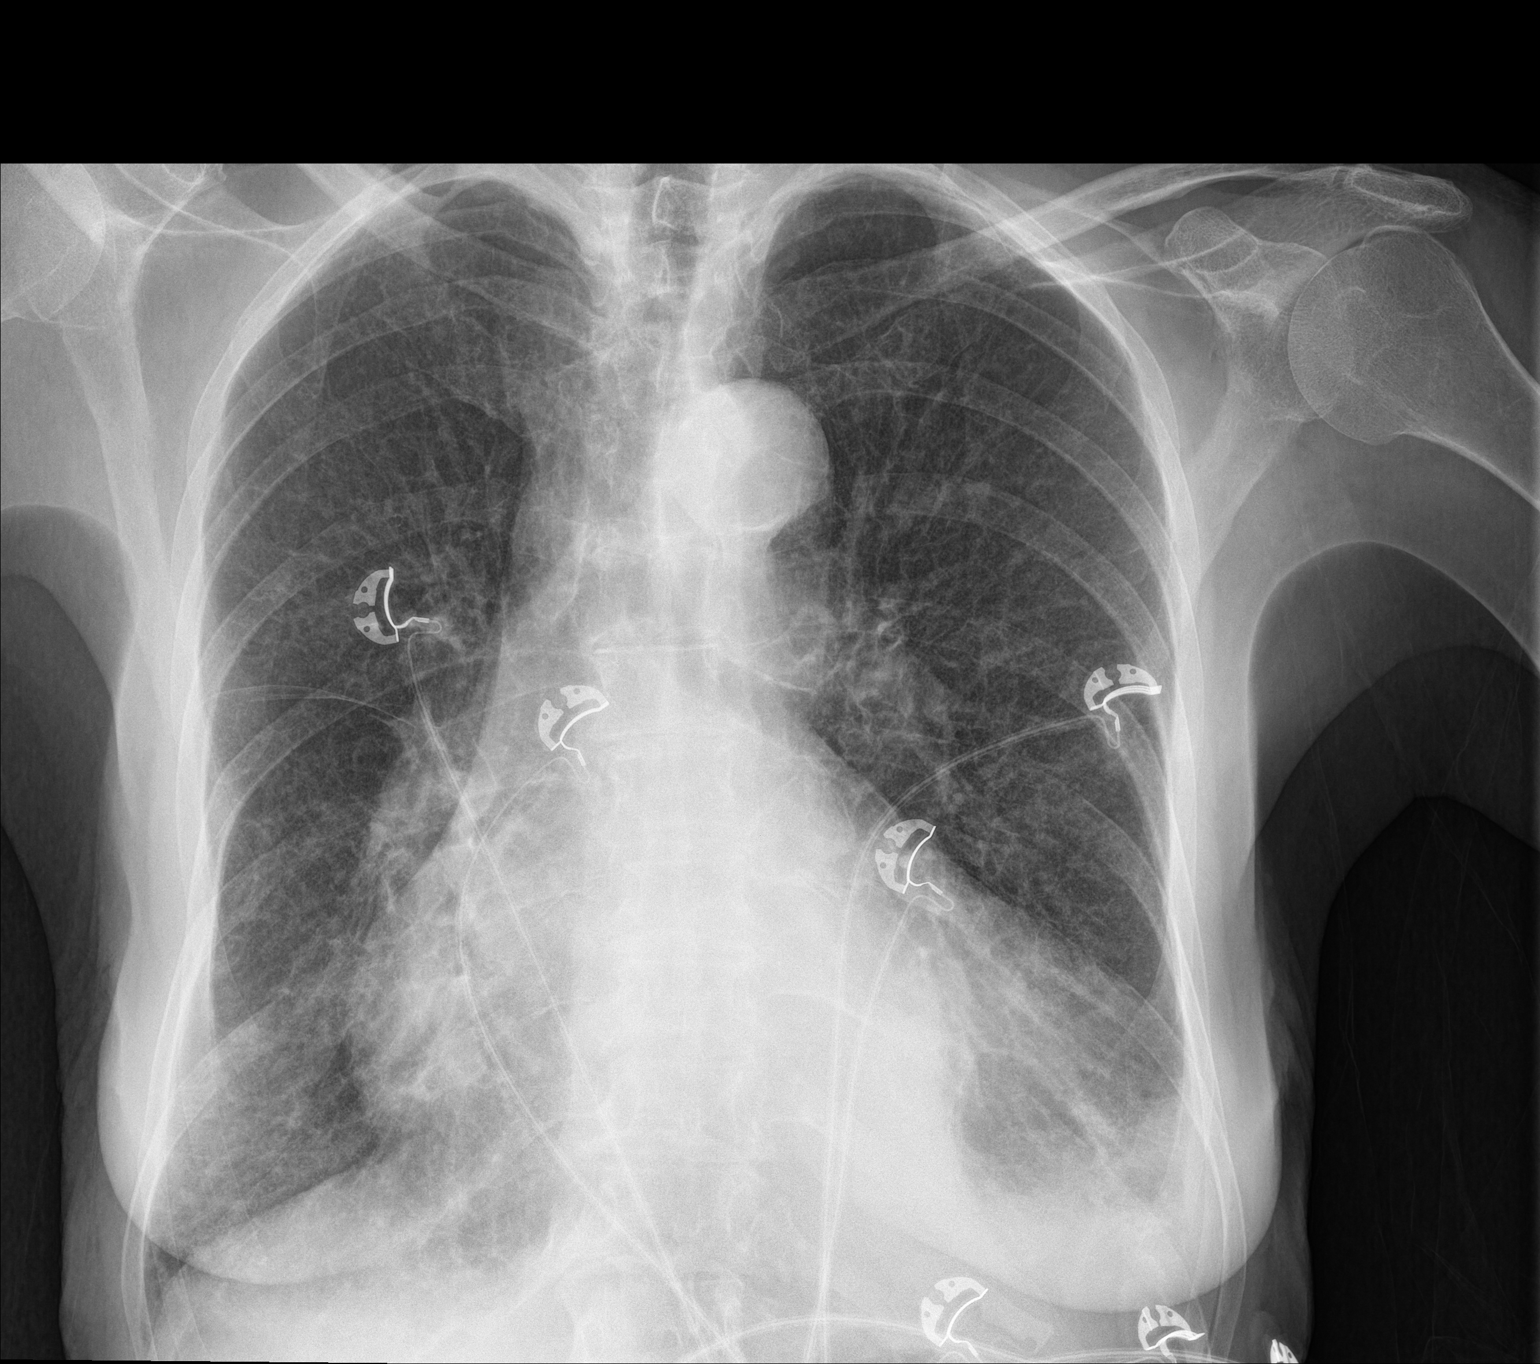

[chest lat]
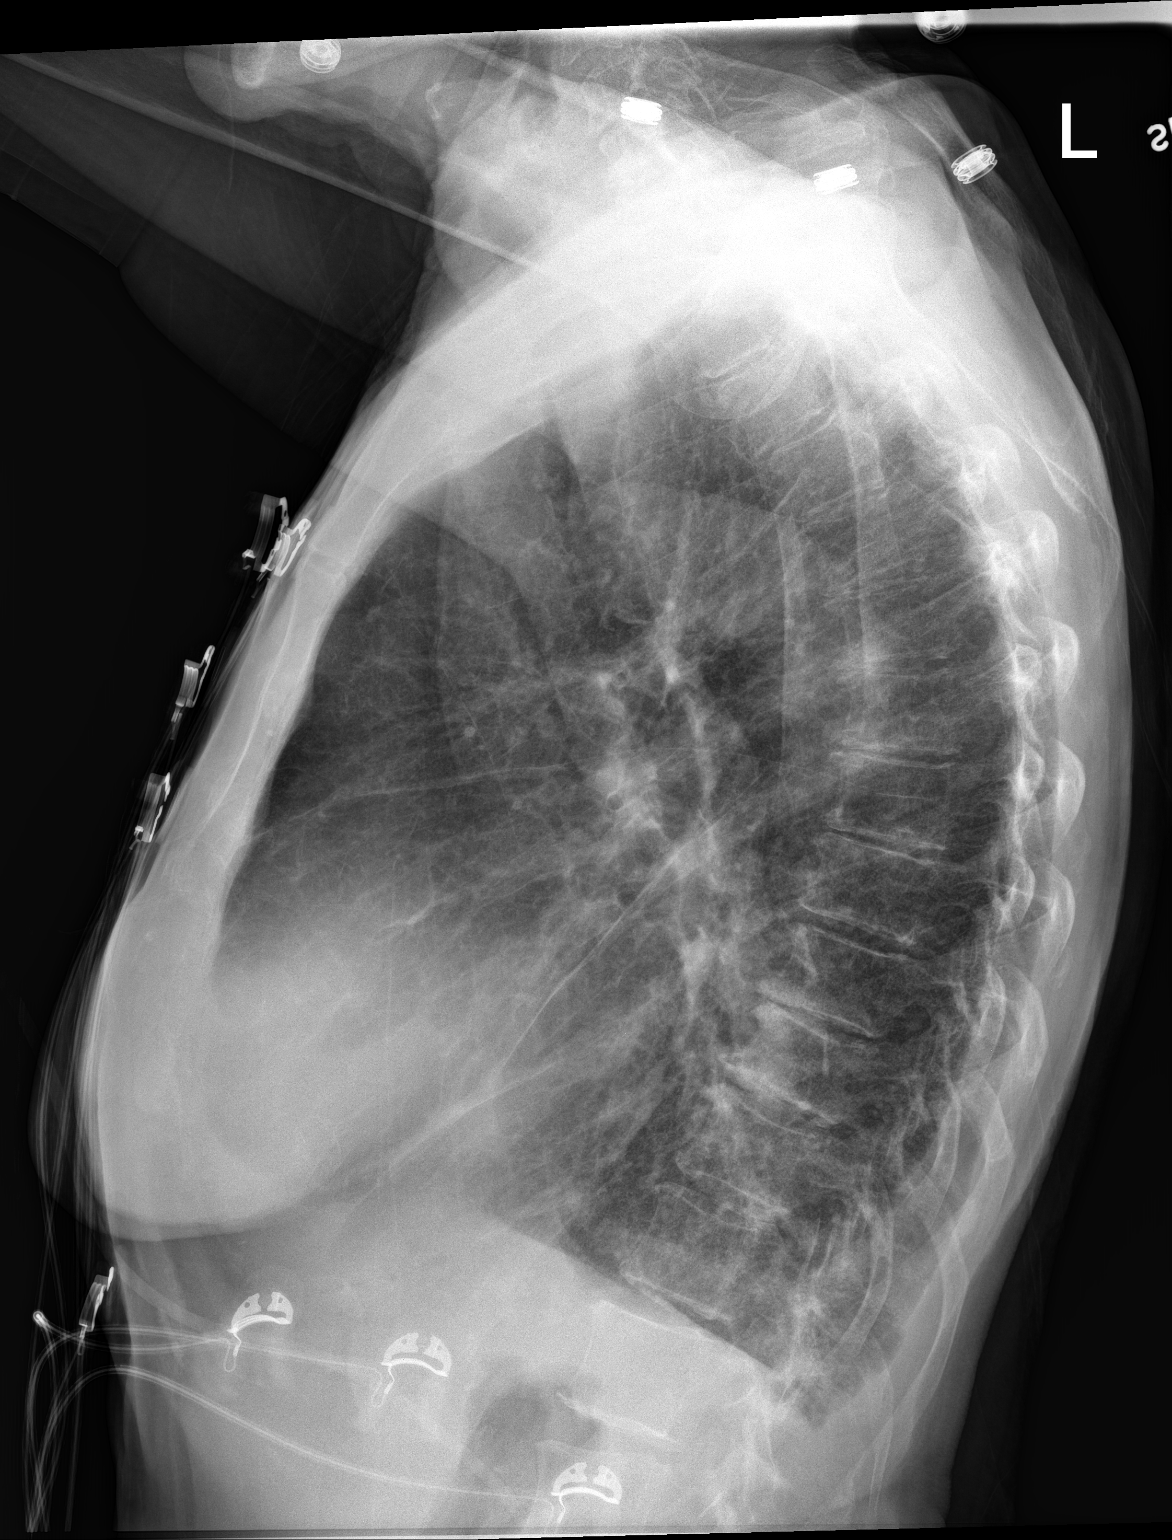

[2 of 2 positions shown; findings below may reference images not displayed]

FINDINGS: Ill-defined airspace consolidation in the medial aspect of the left
lower lobe, concerning for developing bronchopneumonia. Small left
pleural effusion. Right lung is clear. No evidence of pulmonary
edema. Heart size is mildly enlarged. Upper mediastinal contours are
within normal limits. Aortic atherosclerosis.
IMPRESSION: 1. Findings are concerning for left lower lobe pneumonia with small
left parapneumonic pleural effusion.
2. Mild cardiomegaly.
3. Aortic atherosclerosis.

## 2020-06-27 ENCOUNTER — Emergency Department (HOSPITAL_COMMUNITY): Payer: Medicare Other

## 2020-06-27 ENCOUNTER — Encounter (HOSPITAL_COMMUNITY): Payer: Self-pay | Admitting: Emergency Medicine

## 2020-06-27 ENCOUNTER — Other Ambulatory Visit: Payer: Self-pay

## 2020-06-27 ENCOUNTER — Emergency Department (HOSPITAL_COMMUNITY)
Admission: EM | Admit: 2020-06-27 | Discharge: 2020-06-27 | Disposition: A | Discharge status: Home / Self Care | Payer: Medicare Other | Attending: Emergency Medicine | Admitting: Emergency Medicine

## 2020-06-27 DIAGNOSIS — Z950 Presence of cardiac pacemaker: Secondary | ICD-10-CM | POA: Diagnosis not present

## 2020-06-27 DIAGNOSIS — Z7982 Long term (current) use of aspirin: Secondary | ICD-10-CM | POA: Diagnosis not present

## 2020-06-27 DIAGNOSIS — Z8673 Personal history of transient ischemic attack (TIA), and cerebral infarction without residual deficits: Secondary | ICD-10-CM | POA: Insufficient documentation

## 2020-06-27 DIAGNOSIS — I130 Hypertensive heart and chronic kidney disease with heart failure and stage 1 through stage 4 chronic kidney disease, or unspecified chronic kidney disease: Secondary | ICD-10-CM | POA: Insufficient documentation

## 2020-06-27 DIAGNOSIS — S40022A Contusion of left upper arm, initial encounter: Secondary | ICD-10-CM

## 2020-06-27 DIAGNOSIS — S60412A Abrasion of right middle finger, initial encounter: Secondary | ICD-10-CM | POA: Diagnosis not present

## 2020-06-27 DIAGNOSIS — S62646A Nondisplaced fracture of proximal phalanx of right little finger, initial encounter for closed fracture: Secondary | ICD-10-CM | POA: Diagnosis not present

## 2020-06-27 DIAGNOSIS — S0003XA Contusion of scalp, initial encounter: Secondary | ICD-10-CM

## 2020-06-27 DIAGNOSIS — Z7901 Long term (current) use of anticoagulants: Secondary | ICD-10-CM | POA: Insufficient documentation

## 2020-06-27 DIAGNOSIS — Z87891 Personal history of nicotine dependence: Secondary | ICD-10-CM | POA: Diagnosis not present

## 2020-06-27 DIAGNOSIS — S300XXA Contusion of lower back and pelvis, initial encounter: Secondary | ICD-10-CM

## 2020-06-27 DIAGNOSIS — Y92009 Unspecified place in unspecified non-institutional (private) residence as the place of occurrence of the external cause: Secondary | ICD-10-CM | POA: Insufficient documentation

## 2020-06-27 DIAGNOSIS — I5023 Acute on chronic systolic (congestive) heart failure: Secondary | ICD-10-CM | POA: Insufficient documentation

## 2020-06-27 DIAGNOSIS — S60946A Unspecified superficial injury of right little finger, initial encounter: Secondary | ICD-10-CM | POA: Diagnosis present

## 2020-06-27 DIAGNOSIS — Z79899 Other long term (current) drug therapy: Secondary | ICD-10-CM | POA: Diagnosis not present

## 2020-06-27 DIAGNOSIS — W19XXXA Unspecified fall, initial encounter: Secondary | ICD-10-CM

## 2020-06-27 DIAGNOSIS — W1839XA Other fall on same level, initial encounter: Secondary | ICD-10-CM | POA: Diagnosis not present

## 2020-06-27 DIAGNOSIS — N183 Chronic kidney disease, stage 3 unspecified: Secondary | ICD-10-CM | POA: Insufficient documentation

## 2020-06-27 MED ORDER — HYDROCODONE-ACETAMINOPHEN 5-325 MG PO TABS: 1.0000 | ORAL_TABLET | Freq: Four times a day (QID) | ORAL | 0 refills | Status: DC | PRN

## 2020-06-27 MED ORDER — LIDOCAINE HCL (PF) 2 % IJ SOLN: 10.0000 mL | Freq: Once | INTRAMUSCULAR | Status: DC

## 2020-06-27 MED ORDER — LIDOCAINE HCL (PF) 2 % IJ SOLN
INTRAMUSCULAR | Status: AC
  Filled 2020-06-27: qty 10

## 2020-06-27 NOTE — Discharge Instructions (Addendum)
Wear the finger splint until you can be rechecked by the hand specialist who works at Dr[facility] office, Dr. Jeannie Fend.  Call the office this morning to get an appointment.  You have a "impacted right fifth proximal phalangeal fracture with slight angulation".  Return to the emergency room for any problems listed on the head injury sheet.  Keep your abrasions clean and dry.  Keep the abrasion on your right middle finger covered for the next week.  Recheck for any signs of infection such as increased redness, swelling, pain, drainage of pus.

## 2020-06-27 NOTE — ED Triage Notes (Signed)
Pt reports falling backwards from a standing position. Pt C/O right ring finger pain, left shoulder pain, sacral back pain, and head pain. Pt is taking blood thinner.

## 2020-06-27 NOTE — ED Provider Notes (Signed)
[facility] EMERGENCY DEPARTMENT Provider Note   CSN: [id number] Arrival date & time: 06/27/20  0116   Time seen 3:50 AM  History Chief Complaint  Patient presents with  . Fall    Carol Hardin is a 86 y.o. female.  HPI   Patient and husband state about 1235 this morning patient walked into the kitchen and saw something on the counter and as she went to pick up-it ran towards her causing her to back up and fall backwards.  She states she hit the back of her head on the tile floor but she was not knocked unconscious.  She complains of pain in her left lower back where she hit the floor.  She also complains of some pain in her right little finger and a abrasion on her right middle finger that is bleeding.  Patient is on Eliquis.  She also has some bruising of her left arm.  She denies nausea, vomiting, blurred vision or new numbness or tingling of her extremities.  Husband states her tetanus is up-to-date.  Patient has had the [employer] Covid vaccine  PCP Celene Squibb, MD   Past Medical History:  Diagnosis Date  . Anxiety   . Arthritis    "fingers, right toe" (08/09/2016)  . Basal cell carcinoma of lower leg, right   . Bradycardia    a. Holter 08/30/16 showed profound bradycardia down to 30 during awake hours, several 2 second pauses, very frequent PVCs >8000 in 48 hours, and NSVT (longest of 14 beats).  . Cardiomyopathy ([place])    a. EF 40% by echo 08/2016.  . Daily headache    "I usually wake up w/a headache; sometimes it's a migraine" (08/09/2016)  . Depression   . Diverticulosis    Hx. of  . Frequent PVCs   . GERD (gastroesophageal reflux disease)   . Hypercholesterolemia   . Migraine   . Mitral regurgitation   . MVP (mitral valve prolapse)   . NSVT (nonsustained ventricular tachycardia) ([place])    a. first noted event monitor 08/2016.  . Osteoporosis   . PAF (paroxysmal atrial fibrillation) ([place])    a. in afib at time of echo 08/2016.  Marland Kitchen Pneumonia   . Scoliosis     mild  . Squamous cell carcinoma of neck     Patient Active Problem List   Diagnosis Date Noted  . Muscular deconditioning 01/07/2020  . Dysphagia 03/30/2019  . Constipation 03/30/2019  . Pacemaker   . Chronic respiratory failure with hypoxia ([place])   . On home O2   . Sick sinus syndrome ([place]) 01/22/2019  . CKD (chronic kidney disease) stage 3, GFR 30-59 ml/min 01/21/2019  . Atrial fibrillation with RVR ([place]) 01/20/2019  . Acute on chronic systolic CHF (congestive heart failure) ([place]) 12/25/2018  . Tachy-brady syndrome ([place]) 12/25/2018  . Atrial fibrillation, chronic ([place]) 12/24/2018  . Congestive heart failure with left ventricular diastolic dysfunction, unspecified failure chronicity ([place]) 12/24/2018  . Malnutrition of moderate degree 11/20/2018  . Atrial fibrillation with rapid ventricular response ([place]) 11/18/2018  . GERD (gastroesophageal reflux disease) 11/18/2018  . CAP (community acquired pneumonia) 11/18/2018  . Acute on chronic diastolic HF (heart failure) ([place]) 11/18/2018  . Stroke (cerebrum) ([place]) 05/19/2018  . Hip fracture ([place]) 12/31/2017  . PAF (paroxysmal atrial fibrillation) ([place])   . NSVT (nonsustained ventricular tachycardia) ([place])   . Mitral regurgitation   . Frequent PVCs   . Bradycardia   . Essential hypertension   . Hypokalemia   .  Diverticulitis 08/09/2016  . Diverticulosis of colon 04/22/2016  . Diverticulosis of large intestine without perforation or abscess without bleeding 04/22/2016  . Generalized anxiety disorder 04/22/2016  . Irritable bowel syndrome without diarrhea 04/22/2016  . Mitral valve prolapse 04/22/2016  . Mixed hyperlipidemia 04/22/2016  . Osteopenia 04/22/2016  . Low blood pressure 09/24/2014  . Hypercholesterolemia   . History of depression   . Scoliosis   . Osteoporosis   . DOE (dyspnea on exertion) 05/31/2008    Past Surgical History:  Procedure Laterality Date  . AV NODE ABLATION N/A 01/23/2019   Procedure: AV NODE  ABLATION;  Surgeon: Thompson Grayer, MD;  Location: [facility];  Service: Cardiovascular;  Laterality: N/A;  . BASAL CELL CARCINOMA EXCISION Right    RLE  . BIV PACEMAKER INSERTION CRT-P N/A 01/23/2019   Procedure: BIV PACEMAKER INSERTION CRT-P;  Surgeon: Thompson Grayer, MD;  Location: [facility];  Service: Cardiovascular;  Laterality: N/A;  . BLEPHAROPLASTY    . BREAST BIOPSY Left   . BREAST CYST ASPIRATION Left   . BREAST CYST EXCISION Left   . BUNIONECTOMY Bilateral   . DILATION AND CURETTAGE OF UTERUS    . EXCISIONAL HEMORRHOIDECTOMY    . INTRAMEDULLARY (IM) NAIL INTERTROCHANTERIC Left 12/31/2017   Procedure: INTRAMEDULLARY (IM) NAIL INTERTROCHANTRIC;  Surgeon: Nicholes Stairs, MD;  Location: [facility];  Service: Orthopedics;  Laterality: Left;  . KNEE ARTHROSCOPY Right 04/12/2007  . KNEE ARTHROSCOPY Left   . SQUAMOUS CELL CARCINOMA EXCISION     "neck"  . TONSILLECTOMY       OB History    Gravida  6   Para  4   Term  4   Preterm      AB  2   Living        SAB  2   TAB      Ectopic      Multiple      Live Births              Family History  Problem Relation Age of Onset  . Heart failure Mother   . Leukemia Mother   . Heart failure Father   . Allergic rhinitis Neg Hx   . Angioedema Neg Hx   . Asthma Neg Hx   . Eczema Neg Hx   . Atopy Neg Hx   . Immunodeficiency Neg Hx   . Urticaria Neg Hx     Social History   Tobacco Use  . Smoking status: Former Smoker    Packs/day: 1.00    Years: 20.00    Pack years: 20.00    Types: Cigarettes    Quit date: 1989    Years since quitting: 32.7  . Smokeless tobacco: Never Used  Vaping Use  . Vaping Use: Never used  Substance Use Topics  . Alcohol use: No  . Drug use: No  Lives at home Lives with spouse  Home Medications Prior to Admission medications   Medication Sig Start Date End Date Taking? Authorizing Provider  aspirin 325 MG tablet Take 325 mg by mouth daily.    [provider]  Calcium Carbonate Antacid (TUMS PO) Take by mouth as needed.    [provider]  carvedilol (COREG) 3.125 MG tablet Take 1 tablet (3.125 mg total) by mouth 2 (two) times daily. 04/18/20 07/17/20  O'NealCassie Freer, MD  docusate sodium (COLACE) 100 MG capsule Take 100 mg by mouth daily as needed for mild constipation.  [provider]  ELIQUIS 2.5 MG TABS tablet TAKE 1 TABLET(2.5 MG) BY MOUTH TWICE DAILY 06/19/20   Allred, Jeneen Rinks, MD  Emollient (MEDERMA AG FACE) CREA Apply 1 application topically daily. 08/23/19   [provider]  Ensure (ENSURE) Take 237 mLs by mouth daily.     [provider]  furosemide (LASIX) 20 MG tablet Take 20 mg by mouth as needed for fluid.    [provider]  HYDROcodone-acetaminophen (NORCO/VICODIN) 5-325 MG tablet Take 1 tablet by mouth every 6 (six) hours as needed for moderate pain or severe pain. 06/27/20   Rolland Porter, MD  LORazepam (ATIVAN) 1 MG tablet Take 1 mg by mouth at bedtime.    [provider]  Polyethyl Glycol-Propyl Glycol (SYSTANE OP) Apply to eye as needed.    [provider]  Probiotic Product (ALIGN PO) Take by mouth daily.    [provider]  Psyllium (METAMUCIL FIBER PO) Take by mouth daily. 1 1/2 teaspoons daily    [provider]    Allergies    Omeprazole, Flagyl [metronidazole], Amiodarone, Aripiprazole, Hylan g-f 20, Paroxetine hcl, Statins, Sulfa antibiotics, Toprol xl [metoprolol succinate], Vancomycin, and Penicillins  Review of Systems   Review of Systems  All other systems reviewed and are negative.   Physical Exam Updated Vital Signs BP 99/67   Pulse 72   Temp 98 F (36.7 C)   Resp 16   SpO2 96%   Physical Exam Vitals and nursing note reviewed.  Constitutional:      Comments: Frail elderly female  HENT:     Head: Normocephalic.     Comments: When I look at her posterior scalp there is a small area of redness consistent with  bruising that is just to the left of midline at the back of the head.    Nose: Nose normal.  Eyes:     Extraocular Movements: Extraocular movements intact.     Conjunctiva/sclera: Conjunctivae normal.     Pupils: Pupils are equal, round, and reactive to light.  Cardiovascular:     Rate and Rhythm: Normal rate and regular rhythm.     Pulses: Normal pulses.     Heart sounds: Normal heart sounds. No murmur heard.   Pulmonary:     Effort: Pulmonary effort is normal. No respiratory distress.     Breath sounds: Normal breath sounds.  Chest:     Chest wall: No tenderness.  Abdominal:     General: Abdomen is flat. Bowel sounds are normal.     Palpations: Abdomen is soft.  Musculoskeletal:     Cervical back: Normal range of motion and neck supple. No tenderness.       Back:     Comments: When I removed the Band-Aid over her right middle finger there is a small superficial abrasion on the dorsum of the finger proximal phalanx that keeps bleeding.  On exam of her right little finger she has had a swelling and diffuse bruising and the finger is deviated ulnarly.  Patient is noted to have some superficial linear bruising of her left upper arm and her left forearm but she has excellent range of motion.  On exam of her back she is nontender in the thoracic or lumbar spine in the midline.  She is tender over the left posterior iliac wing.  Patient is extremely thin and her bones are very prominent.  Skin:    General: Skin is warm and dry.  Neurological:     General:  No focal deficit present.     Mental Status: She is oriented to person, place, and time.     Cranial Nerves: No cranial nerve deficit.  Psychiatric:        Mood and Affect: Mood normal.        Behavior: Behavior normal.        Thought Content: Thought content normal.     ED Results / Procedures / Treatments   Labs (all labs ordered are listed, but only abnormal results are displayed) Labs Reviewed - No data to  display  EKG None  Radiology DG Lumbar Spine Complete  Result Date: 06/27/2020 CLINICAL DATA:  [place] from standing, low back pain EXAM: LUMBAR SPINE - COMPLETE 4+ VIEW COMPARISON:  08/20/2019 FINDINGS: Frontal, bilateral oblique, lateral views of the lumbar spine are obtained. There are 5 non-rib-bearing lumbar type vertebral bodies, with right convex curvature centered at the thoracolumbar junction. No acute displaced fracture. Mild spondylosis at L3-4. Mild facet hypertrophy from L3 through S1. Sacroiliac joints are normal. Extensive atherosclerosis of the abdominal aorta. IMPRESSION: 1. Right convex scoliosis at the thoracolumbar junction. 2. Lower lumbar spondylosis and facet hypertrophy. No acute fracture. Electronically Signed   By: Randa Ngo M.D.   On: 06/27/2020 03:21   CT Head Wo Contrast  Result Date: 06/27/2020 CLINICAL DATA:  [place], head pain EXAM: CT HEAD WITHOUT CONTRAST TECHNIQUE: Contiguous axial images were obtained from the base of the skull through the vertex without intravenous contrast. COMPARISON:  05/19/2018 FINDINGS: Brain: Chronic small vessel ischemic changes are seen throughout the periventricular white matter. No signs of acute infarct or hemorrhage. Lateral ventricles and midline structures are unremarkable. No acute extra-axial fluid collections. No mass effect. Vascular: No hyperdense vessel or unexpected calcification. Skull: Normal. Negative for fracture or focal lesion. Sinuses/Orbits: No acute finding. Other: None. IMPRESSION: 1. Stable head CT, no acute process. Electronically Signed   By: Randa Ngo M.D.   On: 06/27/2020 03:23    DG Finger Little Right  Result Date: 06/27/2020 CLINICAL DATA:  Attempted reduction EXAM: RIGHT LITTLE FINGER 2+V COMPARISON:  06/27/2020 FINDINGS: Fracture through the proximal phalanx of the right little finger again noted. Continued angulation and impaction. Slight improved alignment. No subluxation or dislocation. IMPRESSION:  Impacted right 5th proximal phalangeal fracture with slight angulation. Slight improved alignment. Electronically Signed   By: Rolm Baptise M.D.   On: 06/27/2020 05:26     Procedures Reduction of fracture  Date/Time: 06/27/2020 4:59 AM Performed by: Rolland Porter, MD Authorized by: Rolland Porter, MD  Preparation: Patient was prepped and draped in the usual sterile fashion. Local anesthesia used: yes Anesthesia: digital block  Anesthesia: Local anesthesia used: yes Local Anesthetic: lidocaine 1% with epinephrine Anesthetic total: 2 mL  Sedation: Patient sedated: no  Patient tolerance: patient tolerated the procedure well with no immediate complications Comments: I attempted reduction of her finger.  She appeared to have improvement and then it would pop back out.  I did it again and then splinted the finger in place.  Post reduction films were done.    (including critical care time)  Medications Ordered in ED Medications  lidocaine HCl (PF) (XYLOCAINE) 2 % injection 10 mL (has no administration in time range)  lidocaine HCl (PF) (XYLOCAINE) 2 % injection (has no administration in time range)    ED Course  I have reviewed the triage vital signs and the nursing notes.  Pertinent labs & imaging results that were available during my care of the patient  were reviewed by me and considered in my medical decision making (see chart for details).    MDM Rules/Calculators/A&P                         Patient fell and hit her head and is on Eliquis.  CT head was done to rule out intracranial hemorrhage or any other injury.  Her lumbar spine and pelvis was done and her right hand.   Surgicel was placed over the bleeding area on her middle finger and it was wrapped.  I placed the finger splint on her right little finger after trying to reduce her finger and wrapped it and buddy taped it to the next finger.  Postreduction films were done.  Patient states she sees Dr. Stann Mainland of [facility].   She can be referred back to him for treatment of her finger.  Final Clinical Impression(s) / ED Diagnoses Final diagnoses:  Fall in home, initial encounter  Closed nondisplaced fracture of proximal phalanx of right little finger, initial encounter  Contusion of scalp, initial encounter  Contusion of pelvis, initial encounter  Abrasion of right middle finger, initial encounter  Arm bruise, left, initial encounter    Rx / DC Orders ED Discharge Orders         Ordered    HYDROcodone-acetaminophen (NORCO/VICODIN) 5-325 MG tablet  Every 6 hours PRN,   Status:  Discontinued        06/27/20 0615    HYDROcodone-acetaminophen (NORCO/VICODIN) 5-325 MG tablet  Every 6 hours PRN        06/27/20 0616          Plan discharge  Rolland Porter, MD, Barbette Or, MD 06/27/20 [phone]

## 2020-07-02 DIAGNOSIS — S62619A Displaced fracture of proximal phalanx of unspecified finger, initial encounter for closed fracture: Secondary | ICD-10-CM | POA: Insufficient documentation

## 2020-07-24 ENCOUNTER — Ambulatory Visit: Payer: Medicare Other

## 2020-07-30 ENCOUNTER — Ambulatory Visit: Payer: Medicare Other | Admitting: Allergy & Immunology

## 2020-07-30 ENCOUNTER — Other Ambulatory Visit: Payer: Self-pay

## 2020-07-30 ENCOUNTER — Encounter: Payer: Self-pay | Admitting: Allergy & Immunology

## 2020-07-30 VITALS — BP 100/70 | HR 71 | Resp 16

## 2020-07-30 DIAGNOSIS — R7989 Other specified abnormal findings of blood chemistry: Secondary | ICD-10-CM | POA: Diagnosis not present

## 2020-07-30 DIAGNOSIS — G8929 Other chronic pain: Secondary | ICD-10-CM

## 2020-07-30 DIAGNOSIS — J310 Chronic rhinitis: Secondary | ICD-10-CM

## 2020-07-30 DIAGNOSIS — R519 Headache, unspecified: Secondary | ICD-10-CM | POA: Diagnosis not present

## 2020-07-30 NOTE — Progress Notes (Signed)
FOLLOW UP  Date of Service/Encounter:  07/30/20   Assessment:   SOB (shortness of breath)   Chronic rhinitis   Plan/Recommendations:   1. Elevated TSH - We are going to refer you to see Endocrinology here in [place]. - Hopefully they will call in 1-2 weeks to schedule this appointment.  - I do not think that this is related to your weight loss, but I think an Endocrinology referral is still needed.   2. Chronic rhinitis - with negative blood testing - Continue with salt water rinses with NeilMed bottle.  - You can use Allegra one tablet daily. - We will refer you to see Dr. Merlene Laughter (two doors down from our office) - Hopefully they will call you in 1-2 weeks to make an appointment.   3. Return in about 3 months (around 10/30/2020).   Subjective:   Carol Hardin is a 86 y.o. female presenting today for follow up of  Chief Complaint  Patient presents with  . Headache    patient has questions about allergy causing her headaches. had a fall in 06/2020 and has CT head w/o results with her and would like fo ryou to take a look at them.     Carol Hardin has a history of the following: Patient Active Problem List   Diagnosis Date Noted  . Muscular deconditioning 01/07/2020  . Dysphagia 03/30/2019  . Constipation 03/30/2019  . Pacemaker   . Chronic respiratory failure with hypoxia ([place])   . On home O2   . Sick sinus syndrome ([place]) 01/22/2019  . CKD (chronic kidney disease) stage 3, GFR 30-59 ml/min ([facility]) 01/21/2019  . Atrial fibrillation with RVR ([place]) 01/20/2019  . Acute on chronic systolic CHF (congestive heart failure) ([place]) 12/25/2018  . Tachy-brady syndrome ([place]) 12/25/2018  . Atrial fibrillation, chronic ([place]) 12/24/2018  . Congestive heart failure with left ventricular diastolic dysfunction, unspecified failure chronicity ([place]) 12/24/2018  . Malnutrition of moderate degree 11/20/2018  . Atrial fibrillation with rapid ventricular response ([place])  11/18/2018  . GERD (gastroesophageal reflux disease) 11/18/2018  . CAP (community acquired pneumonia) 11/18/2018  . Acute on chronic diastolic HF (heart failure) ([place]) 11/18/2018  . Stroke (cerebrum) ([place]) 05/19/2018  . Hip fracture ([place]) 12/31/2017  . PAF (paroxysmal atrial fibrillation) ([place])   . NSVT (nonsustained ventricular tachycardia) ([place])   . Mitral regurgitation   . Frequent PVCs   . Bradycardia   . Essential hypertension   . Hypokalemia   . Diverticulitis 08/09/2016  . Diverticulosis of colon 04/22/2016  . Diverticulosis of large intestine without perforation or abscess without bleeding 04/22/2016  . Generalized anxiety disorder 04/22/2016  . Irritable bowel syndrome without diarrhea 04/22/2016  . Mitral valve prolapse 04/22/2016  . Mixed hyperlipidemia 04/22/2016  . Osteopenia 04/22/2016  . Low blood pressure 09/24/2014  . Hypercholesterolemia   . History of depression   . Scoliosis   . Osteoporosis   . DOE (dyspnea on exertion) 05/31/2008    History obtained from: chart review and patient.  Carol Hardin is a 86 y.o. female presenting for a follow up visit. She was last seen in January 2021 for evaluation of shortness of breath and concern for environmental allergies. She was convinced that she was reacting to amiodarone. We did environmental allergy testing via the blood and this was negative. We referred to Pulmonology because I did not know what to do if this was amiodarone and I had never seen it in clinical practice. Thankfully Dr. Lamonte Sakai did not think  that this was related to amiodarone at all.   Since the last visit, she has been hooked up with Pulmonology. They did not feel that this was related to amiodarone. Evidently she is still having trouble gaining weight and she feels "like a skeleton". She ended up changing Cardiologists and she had a brief visit with him. She was placed on a beta blocker. She was told that she would only last a couple of years without a  medication to help with her heart. Her new Cardiologist is Dr. Farris Has.   She had lab work done that demonstrated elevated TSH. She had a level of 5.220 that is elevated compared to the normal. She was told that she might have an "underactive thyroid". She has never seen an Endocrinologist. Her PCP did not want to address the elevated TSH. She is having a physical done in November.   She is just concerned that she is not back to her normal self after her surgery two years ago. She did have an elevated BNP and she was told to use Lasix for three days in a row when she notices leg swelling. She reports that she loses two more pounds after taking one dose of Lasix. She does not want to "vanish into thin air". She has seen RDs in the past and she is eating a lot. She reports that she has a good appetite. She thinks it is something "in here" as she points to her stomach.   She concerned that she is still having some allergy issues. She continues to have some rhinorrhea. She is using the NeilMed bottle. She reports symptoms of a head cold and was taking Cold Ease which seems to be helping. She reports that the spray that I gave her was too strong and she was too sensitive to nasal sprays in general.   She does have a history of constipation. She is taking Metamucil for this. She is most concerned with her headaches and her weight. She did go to a headache clinic on [address], but nothing ever came of it. This was around the time she moved to [place]. This was 2.5 years ago. She does have a Neurology appointment in December 2021, but she is open to canceling that one.   She did get the COVID19 vaccination. She getting the booster tomorrow.   HRCT (Feb 2021): 1. No evidence of fibrotic interstitial lung disease or amiodarone toxicity. 2. Mild basilar predominant septal thickening suggests pulmonary edema. 3. Air trapping is indicative of small airways disease. 4. Mild mediastinal adenopathy, possibly  reactive. Difficult to definitively exclude a lymphoproliferative disorder. 5. Aortic atherosclerosis (ICD10-I70.0). Coronary artery calcification. 6. Enlarged pulmonic trunk, indicative of pulmonary arterial hypertension.  Otherwise, there have been no changes to her past medical history, surgical history, family history, or social history.    Review of Systems  Constitutional: Positive for weight loss. Negative for chills, fever and malaise/fatigue.  HENT: Negative for congestion, ear discharge, ear pain and sinus pain.   Eyes: Negative for pain, discharge and redness.  Respiratory: Negative for cough, sputum production, shortness of breath and wheezing.   Cardiovascular: Negative.  Negative for chest pain and palpitations.  Gastrointestinal: Negative for abdominal pain, constipation, diarrhea, heartburn, nausea and vomiting.  Skin: Negative.  Negative for itching and rash.  Neurological: Negative for dizziness and headaches.  Endo/Heme/Allergies: Negative for environmental allergies. Does not bruise/bleed easily.       Objective:   Blood pressure 100/70, pulse 71, resp. rate 16,  SpO2 98 %. There is no height or weight on file to calculate BMI.   Physical Exam  Constitutional: Thin appearing female. Very tearful.  HENT:  Head: Normocephalic and atraumatic.  Right Ear: Tympanic membrane, external ear and ear canal normal. No drainage, swelling or tenderness. Tympanic membrane is not injected, not scarred, not erythematous, not retracted and not bulging.  Left Ear: Tympanic membrane, external ear and ear canal normal. No drainage, swelling or tenderness. Tympanic membrane is not injected, not scarred, not erythematous, not retracted and not bulging.  Nose: Mucosal edema and rhinorrhea present. No nasal deformity or septal deviation. No epistaxis. No polyps. Right sinus exhibits no maxillary sinus tenderness and no frontal sinus tenderness. Left sinus exhibits no maxillary sinus  tenderness and no frontal sinus tenderness.  Mouth/Throat: Uvula is midline and oropharynx is clear and moist. Mucous membranes are not pale and not dry.  Eyes: Pupils are equal, round, and reactive to light. Conjunctivae and EOM are normal. Right eye exhibits no chemosis and no discharge. Left eye exhibits no chemosis and no discharge. Right conjunctiva is not injected. Left conjunctiva is not injected.  Cardiovascular: Normal rate, regular rhythm and normal heart sounds.  Respiratory: Effort normal and breath sounds normal. No accessory muscle usage. No tachypnea. No respiratory distress. She has no wheezes. She has no rhonchi. She has no rales. She exhibits no tenderness. Decreased air sounds at the bases.  GI: There is no abdominal tenderness. There is no rebound and no guarding.  Lymphadenopathy:       Head (right side): No submandibular, no tonsillar and no occipital adenopathy present.       Head (left side): No submandibular, no tonsillar and no occipital adenopathy present.    She has no cervical adenopathy.  Neurological: She is alert.  Skin: No abrasion, no petechiae and no rash noted. Rash is not papular, not vesicular and not urticarial. No erythema. No pallor.  Multiple bruises over her entire body. There are no urticarial lesions appreciated.   Psychiatric: She has a normal mood and affect.     Diagnostic studies: none      Salvatore Marvel, MD  [facility] [place] [facility] [place]

## 2020-07-30 NOTE — Patient Instructions (Addendum)
1. Elevated TSH - We are going to refer you to see Endocrinology here in [place]. - Hopefully they will call in 1-2 weeks to schedule this appointment.   2. Chronic rhinitis - with negative blood testing - Continue with salt water rinses with NeilMed bottle.  - You can use Allegra one tablet daily. - We will refer you to see Dr. Merlene Laughter (two doors down from our office) - Hopefully they will call you in 1-2 weeks to make an appointment.   3. Return in about 3 months (around 10/30/2020).    Please inform us of any Emergency Department visits, hospitalizations, or changes in symptoms. Call us before going to the ED for breathing or allergy symptoms since we might be able to fit you in for a sick visit. Feel free to contact us anytime with any questions, problems, or concerns.  It was a pleasure to meet you today! I am so sorry that all of this is happening to you.  Websites that have reliable patient information: 1. American Academy of Asthma, Allergy, and Immunology: www.aaaai.org 2. Food Allergy Research and Education (FARE): foodallergy.org 3. Mothers of Asthmatics: http://www.asthmacommunitynetwork.org 4. American College of Allergy, Asthma, and Immunology: www.acaai.org  "Like" [place] on Facebook and Instagram for our latest updates!       Make sure you are registered to vote! If you have moved or changed any of your contact information, you will need to get this updated before voting!  In some cases, you MAY be able to register to vote online: [email or web address]

## 2020-07-31 ENCOUNTER — Telehealth: Payer: Self-pay

## 2020-07-31 ENCOUNTER — Encounter: Payer: Self-pay | Admitting: Allergy & Immunology

## 2020-07-31 ENCOUNTER — Ambulatory Visit: Payer: Medicare Other | Attending: Internal Medicine

## 2020-07-31 DIAGNOSIS — Z23 Encounter for immunization: Secondary | ICD-10-CM

## 2020-07-31 NOTE — Progress Notes (Signed)
   Covid-19 Vaccination Clinic  Name:  Carol Hardin    MRN: [id number] DOB: [birth year]  07/31/2020  Carol Hardin was observed post Covid-19 immunization for 15 minutes without incident. She was provided with Vaccine Information Sheet and instruction to access the V-Safe system.   Carol Hardin was instructed to call 911 with any severe reactions post vaccine: Marland Kitchen Difficulty breathing  . Swelling of face and throat  . A fast heartbeat  . A bad rash all over body  . Dizziness and weakness

## 2020-07-31 NOTE — Telephone Encounter (Signed)
No, I think this one needs to see Dr. Dorris Fetch first since its an initial suspected hypothyroidism case

## 2020-07-31 NOTE — Telephone Encounter (Signed)
Can you see this pt? It only says elevated TSH

## 2020-08-05 ENCOUNTER — Telehealth: Payer: Self-pay

## 2020-08-05 NOTE — Telephone Encounter (Signed)
Patients PCP placed a referral to [facility] for headaches, she is scheduled on 09/24/2020 with Melvenia Beam, MD.  Patient has also been called 1x from Dr Liliane Channel office to schedule.   Thanks

## 2020-08-05 NOTE — Telephone Encounter (Signed)
-----   Message from Valentina Shaggy, MD sent at 07/31/2020  5:48 AM EDT ----- Referrals placed for Endo and Neuro

## 2020-08-06 NOTE — Telephone Encounter (Signed)
Awesome! Thanks!   Carol Marvel, MD [facility]

## 2020-08-11 ENCOUNTER — Ambulatory Visit (INDEPENDENT_AMBULATORY_CARE_PROVIDER_SITE_OTHER): Payer: Medicare Other

## 2020-08-11 DIAGNOSIS — I428 Other cardiomyopathies: Secondary | ICD-10-CM

## 2020-08-11 DIAGNOSIS — I495 Sick sinus syndrome: Secondary | ICD-10-CM

## 2020-08-11 LAB — CUP PACEART REMOTE DEVICE CHECK
Battery Remaining Longevity: 75 mo
Battery Remaining Percentage: 95.5 %
Battery Voltage: 2.99 V
Brady Statistic AP VP Percent: 0 %
Brady Statistic AP VS Percent: 0 %
Brady Statistic AS VP Percent: 0 %
Brady Statistic AS VS Percent: 0 %
Brady Statistic RA Percent Paced: 1 %
Date Time Interrogation Session: 20211101020010
Implantable Lead Implant Date: 20200414
Implantable Lead Implant Date: 20200414
Implantable Lead Implant Date: 20200414
Implantable Lead Location: 753858
Implantable Lead Location: 753859
Implantable Lead Location: 753860
Implantable Pulse Generator Implant Date: 20200414
Lead Channel Impedance Value: 480 Ohm
Lead Channel Impedance Value: 530 Ohm
Lead Channel Impedance Value: 780 Ohm
Lead Channel Pacing Threshold Amplitude: 0.5 V
Lead Channel Pacing Threshold Amplitude: 1 V
Lead Channel Pacing Threshold Pulse Width: 0.5 ms
Lead Channel Pacing Threshold Pulse Width: 0.7 ms
Lead Channel Sensing Intrinsic Amplitude: 1.9 mV
Lead Channel Sensing Intrinsic Amplitude: 12 mV
Lead Channel Setting Pacing Amplitude: 2.5 V
Lead Channel Setting Pacing Amplitude: 2.5 V
Lead Channel Setting Pacing Amplitude: 3.5 V
Lead Channel Setting Pacing Pulse Width: 0.5 ms
Lead Channel Setting Pacing Pulse Width: 0.7 ms
Lead Channel Setting Sensing Sensitivity: 3 mV
Pulse Gen Model: 3562
Pulse Gen Serial Number: 9431558

## 2020-08-11 LAB — BASIC METABOLIC PANEL
BUN: 17 (ref 4–21)
Creatinine: 0.9 (ref 0.5–1.1)

## 2020-08-11 LAB — COMPREHENSIVE METABOLIC PANEL
Calcium: 9.3 (ref 8.7–10.7)
GFR calc Af Amer: 68
GFR calc non Af Amer: 59

## 2020-08-11 LAB — LIPID PANEL
Cholesterol: 134 (ref 0–200)
HDL: 41 (ref 35–70)
LDL Cholesterol: 79
Triglycerides: 67 (ref 40–160)

## 2020-08-11 LAB — TSH: TSH: 3.66 (ref 0.41–5.90)

## 2020-08-11 LAB — HEMOGLOBIN A1C: Hemoglobin A1C: 5.7

## 2020-08-12 ENCOUNTER — Encounter: Payer: Self-pay | Admitting: "Endocrinology

## 2020-08-12 ENCOUNTER — Ambulatory Visit: Payer: Medicare Other | Admitting: "Endocrinology

## 2020-08-12 ENCOUNTER — Other Ambulatory Visit: Payer: Self-pay

## 2020-08-12 VITALS — BP 94/70 | HR 76 | Ht 64.0 in | Wt 123.8 lb

## 2020-08-12 DIAGNOSIS — E038 Other specified hypothyroidism: Secondary | ICD-10-CM

## 2020-08-12 NOTE — Progress Notes (Signed)
Endocrinology Consult Note                                            08/12/2020, 4:20 PM   Subjective:    Patient ID: Carol Hardin, female    DOB: 1933/09/29, PCP Celene Squibb, MD   Past Medical History:  Diagnosis Date  . Anxiety   . Arthritis    "fingers, right toe" (08/09/2016)  . Basal cell carcinoma of lower leg, right   . Bradycardia    a. Holter 08/30/16 showed profound bradycardia down to 30 during awake hours, several 2 second pauses, very frequent PVCs >8000 in 48 hours, and NSVT (longest of 14 beats).  . Cardiomyopathy ([place])    a. EF 40% by echo 08/2016.  . Daily headache    "I usually wake up w/a headache; sometimes it's a migraine" (08/09/2016)  . Depression   . Diverticulosis    Hx. of  . Frequent PVCs   . GERD (gastroesophageal reflux disease)   . Hypercholesterolemia   . Migraine   . Mitral regurgitation   . MVP (mitral valve prolapse)   . NSVT (nonsustained ventricular tachycardia) ([place])    a. first noted event monitor 08/2016.  . Osteoporosis   . PAF (paroxysmal atrial fibrillation) ([place])    a. in afib at time of echo 08/2016.  Marland Kitchen Pneumonia   . Scoliosis    mild  . Squamous cell carcinoma of neck    Past Surgical History:  Procedure Laterality Date  . AV NODE ABLATION N/A 01/23/2019   Procedure: AV NODE ABLATION;  Surgeon: Thompson Grayer, MD;  Location: [facility];  Service: Cardiovascular;  Laterality: N/A;  . BASAL CELL CARCINOMA EXCISION Right    RLE  . BIV PACEMAKER INSERTION CRT-P N/A 01/23/2019   Procedure: BIV PACEMAKER INSERTION CRT-P;  Surgeon: Thompson Grayer, MD;  Location: [facility];  Service: Cardiovascular;  Laterality: N/A;  . BLEPHAROPLASTY    . BREAST BIOPSY Left   . BREAST CYST ASPIRATION Left   . BREAST CYST EXCISION Left   . BUNIONECTOMY Bilateral   . DILATION AND CURETTAGE OF UTERUS    . EXCISIONAL HEMORRHOIDECTOMY    . INTRAMEDULLARY (IM) NAIL INTERTROCHANTERIC Left 12/31/2017   Procedure:  INTRAMEDULLARY (IM) NAIL INTERTROCHANTRIC;  Surgeon: Nicholes Stairs, MD;  Location: [facility];  Service: Orthopedics;  Laterality: Left;  . KNEE ARTHROSCOPY Right 04/12/2007  . KNEE ARTHROSCOPY Left   . SQUAMOUS CELL CARCINOMA EXCISION     "neck"  . TONSILLECTOMY     Social History   Socioeconomic History  . Marital status: Married    Spouse name: Not on file  . Number of children: Not on file  . Years of education: Not on file  . Highest education level: Not on file  Occupational History  . Not on file  Tobacco Use  . Smoking status: Former Smoker    Packs/day: 1.00    Years: 20.00    Pack years: 20.00    Types: Cigarettes    Quit date: 1989    Years since quitting: 32.8  . Smokeless tobacco: Never Used  Vaping Use  . Vaping Use: Never used  Substance and Sexual Activity  . Alcohol use: No  . Drug use: No  . Sexual activity: Not Currently  Other Topics Concern  .  Not on file  Social History Narrative  . Not on file   Social Determinants of Health   Financial Resource Strain:   . Difficulty of Paying Living Expenses: Not on file  Food Insecurity:   . Worried About Charity fundraiser in the Last Year: Not on file  . Ran Out of Food in the Last Year: Not on file  Transportation Needs:   . Lack of Transportation (Medical): Not on file  . Lack of Transportation (Non-Medical): Not on file  Physical Activity:   . Days of Exercise per Week: Not on file  . Minutes of Exercise per Session: Not on file  Stress:   . Feeling of Stress : Not on file  Social Connections:   . Frequency of Communication with Friends and Family: Not on file  . Frequency of Social Gatherings with Friends and Family: Not on file  . Attends Religious Services: Not on file  . Active Member of Clubs or Organizations: Not on file  . Attends Archivist Meetings: Not on file  . Marital Status: Not on file   Family History  Problem Relation Age of Onset  . Heart failure Mother   .  Leukemia Mother   . Heart failure Father   . Allergic rhinitis Neg Hx   . Angioedema Neg Hx   . Asthma Neg Hx   . Eczema Neg Hx   . Atopy Neg Hx   . Immunodeficiency Neg Hx   . Urticaria Neg Hx    Outpatient Encounter Medications as of 08/12/2020  Medication Sig  . fexofenadine (ALLEGRA ALLERGY) 180 MG tablet Take 1 tablet by mouth daily.  . Calcium Carbonate Antacid (TUMS PO) Take by mouth as needed.  . carvedilol (COREG) 3.125 MG tablet Take 1 tablet (3.125 mg total) by mouth 2 (two) times daily.  Marland Kitchen docusate sodium (COLACE) 100 MG capsule Take 100 mg by mouth daily as needed for mild constipation.  Marland Kitchen ELIQUIS 2.5 MG TABS tablet TAKE 1 TABLET(2.5 MG) BY MOUTH TWICE DAILY  . Emollient (MEDERMA AG FACE) CREA Apply 1 application topically daily.  . Ensure (ENSURE) Take 237 mLs by mouth daily.   . furosemide (LASIX) 20 MG tablet Take 20 mg by mouth as needed for fluid.  Marland Kitchen LORazepam (ATIVAN) 1 MG tablet Take 1 mg by mouth at bedtime.  [name] Glycol-Propyl Glycol (SYSTANE OP) Apply to eye as needed.  . Probiotic Product (ALIGN PO) Take by mouth daily.  . Psyllium (METAMUCIL FIBER PO) Take by mouth daily. 1 1/2 teaspoons daily   No facility-administered encounter medications on file as of 08/12/2020.   ALLERGIES: Allergies  Allergen Reactions  . Omeprazole Other (See Comments)    siezure  . Flagyl [Metronidazole] Nausea And Vomiting  . Amiodarone   . Aripiprazole Other (See Comments)    Unknown reaction  . Hylan G-F 20 Other (See Comments)    Unknown reaction  . Paroxetine Hcl Other (See Comments)    Headaches (a long time ago- pt doesn't really remember)  . Statins Other (See Comments)    No specific reaction given-patient states that she was advised not to take by physician  . Sulfa Antibiotics Diarrhea and Other (See Comments)    Colitis   . Toprol Xl [Metoprolol Succinate] Other (See Comments)    Dizziness--pt doesn't really remember   . Vancomycin Itching and Other  (See Comments)    Pt reports that they gave it too fast- she started having itching in  the scalp  . Penicillins Rash    DID THE REACTION INVOLVE: Swelling of the face/tongue/throat, SOB, or low BP? No Sudden or severe rash/hives, skin peeling, or the inside of the mouth or nose? Yes Did it require medical treatment? Unknown When did it last happen?Over 10 years If all above answers are "NO", may proceed with cephalosporin use.     VACCINATION STATUS: Immunization History  Administered Date(s) Administered  . Influenza Whole 08/08/2008, 07/29/2009  . Influenza-Unspecified 07/26/2016  . PFIZER SARS-COV-2 Vaccination 10/28/2019, 11/15/2019, 07/31/2020    HPI Carol Hardin is 86 y.o. female who presents today with a medical history as above. she is being seen in consultation for subclinical hypothyroidism requested by Celene Squibb, MD.  History is obtained from the patient as well as from chart review.  She denies any prior history of thyroid dysfunction.  She denies any prior history of thyroid surgery nor thyroid ablation. She was observed to have suppressed TSH at least twice since 2017.  In December 2017 it was 4.91, July 2021 he was 5.22.  1 time in between in February 2020 was normal at 3.48. She does not have associated T4 or T3 measurements.  She reports that prior to June 2021 she had exposure to amiodarone related to tachyarrhythmia.  She does not have recent full profile thyroid function tests.  She denies dysphagia, shortness of breath, no voice change.  She reports progressive weight loss, also reports poor appetite. She denies any family history of thyroid dysfunction, nor thyroid malignancy.  She is not on thyroid hormone supplements or antithyroid intervention at this time.  Review of Systems  Constitutional:  + Unable to gain weight,  +fatigue, no subjective hyperthermia, + subjective hypothermia Eyes: no blurry vision, no xerophthalmia ENT: no sore throat, no  nodules palpated in throat, no dysphagia/odynophagia, no hoarseness Cardiovascular: no Chest Pain, no Shortness of Breath, no palpitations, no leg swelling Respiratory: no cough, no shortness of breath Gastrointestinal: no Nausea/Vomiting/Diarhhea Musculoskeletal: no muscle/joint aches Skin: no rashes Neurological: no tremors, no numbness, no tingling, no dizziness Psychiatric: no depression, no anxiety  Objective:    Vitals with BMI 08/12/2020 07/30/2020 06/27/2020  Height 5\' 4"  - -  Weight 123 lbs 13 oz - -  BMI 16.10 - -  Systolic 94 960 99  Diastolic 70 70 67  Pulse 76 71 72  Some encounter information is confidential and restricted. Go to Review Flowsheets activity to see all data.    BP 94/70   Pulse 76   Ht 5\' 4"  (1.626 m)   Wt 123 lb 12.8 oz (56.2 kg)   BMI 21.25 kg/m   Wt Readings from Last 3 Encounters:  08/12/20 123 lb 12.8 oz (56.2 kg)  04/28/20 125 lb 6.4 oz (56.9 kg)  04/18/20 125 lb (56.7 kg)    Physical Exam  Constitutional:  Body mass index is 21.25 kg/m.,  Chronically sick looking, not in acute distress, normal state of mind Eyes: PERRLA, EOMI, no exophthalmos ENT: moist mucous membranes, no gross thyromegaly, no gross cervical lymphadenopathy Cardiovascular: normal precordial activity, Regular Rate and Rhythm, no Murmur/Rubs/Gallops Respiratory:  adequate breathing efforts, no gross chest deformity, Clear to auscultation bilaterally Gastrointestinal: abdomen soft, Non -tender, No distension, Bowel Sounds present, no gross organomegaly Musculoskeletal:  + Right hand in a cast due to broken little finger,  strength intact in all four extremities Skin: moist, warm, no rashes Neurological: no tremor with outstretched hands, Deep tendon reflexes normal in bilateral lower extremities.  CMP ( most recent) CMP     Component Value Date/Time   NA 140 04/23/2020 1222   K 4.9 04/23/2020 1222   CL 103 04/23/2020 1222   CO2 24 04/23/2020 1222   GLUCOSE 76  04/23/2020 1222   GLUCOSE 111 (H) 08/20/2019 1643   BUN 20 04/23/2020 1222   CREATININE 0.89 04/23/2020 1222   CREATININE 0.93 (H) 09/22/2016 0933   CALCIUM 9.3 04/23/2020 1222   PROT 7.3 04/23/2020 1222   ALBUMIN 4.1 04/23/2020 1222   AST 35 04/23/2020 1222   ALT 21 04/23/2020 1222   ALKPHOS 61 04/23/2020 1222   BILITOT 0.6 04/23/2020 1222   GFRNONAA 60 04/23/2020 1222   GFRAA 69 04/23/2020 1222     Diabetic Labs (most recent): Lab Results  Component Value Date   HGBA1C 5.4 05/20/2018     Lipid Panel ( most recent) Lipid Panel     Component Value Date/Time   CHOL 140 01/23/2019 0631   TRIG 64 01/23/2019 0631   HDL 42 01/23/2019 0631   CHOLHDL 3.3 01/23/2019 0631   VLDL 13 01/23/2019 0631   LDLCALC 85 01/23/2019 0631      Lab Results  Component Value Date   TSH 5.220 (H) 04/23/2020   TSH 3.484 11/18/2018   TSH 4.91 (H) 09/22/2016     Assessment & Plan:   1. Subclinical hypothyroidism  - ANIDA DEOL  is being seen at a kind request of [name], Edwinna Areola, MD. - I have reviewed her available thyroid records and clinically evaluated the patient. - Based on these reviews, she has subclinical hypothyroidism,  however,  there is not recent sufficient information to proceed with definitive treatment plan. -She wishes to avoid duplicate labs and wants to complete her labs with her primary care doctor , Dr. [name], during her next visit tomorrow.  We will give her request for TSH, free T4, and free T3.  -she will return in 1 week to review her repeat labs and treatment decision if necessary.  - I did not initiate any new prescriptions today. - she is advised to maintain close follow up with Celene Squibb, MD for primary care needs.  - Time spent with the patient: 45 minutes, of which >50% was spent in  counseling her about her subclinical hypothyroidism and the rest in obtaining information about her symptoms, reviewing her previous labs/studies ( including abstractions  from other facilities),  evaluations, and treatments,  and developing a plan to confirm diagnosis and long term treatment based on the latest standards of care/guidelines; and documenting her care.  [name] participated in the discussions, expressed understanding, and voiced agreement with the above plans.  All questions were answered to her satisfaction. she is encouraged to contact clinic should she have any questions or concerns prior to her return visit.  Follow up plan: Return in about 1 week (around 08/19/2020), or she will like to take her lab requests to Dr. [name] ,  will see him tomorrow, for F/U with Pre-visit Labs.   Glade Lloyd, MD [facility] [address] [place], [place] [address] Phone: [phone]  Fax: [phone]     08/12/2020, 4:20 PM  This note was partially dictated with voice recognition software. Similar sounding words can be transcribed inadequately or may not  be corrected upon review.

## 2020-08-13 ENCOUNTER — Ambulatory Visit (HOSPITAL_COMMUNITY): Payer: Medicare Other

## 2020-08-13 NOTE — Progress Notes (Signed)
Remote pacemaker transmission.   

## 2020-08-18 ENCOUNTER — Telehealth (HOSPITAL_COMMUNITY): Payer: Self-pay

## 2020-08-18 NOTE — Telephone Encounter (Signed)
pt cancelled appt for 11/9 because she has company coming

## 2020-08-19 ENCOUNTER — Ambulatory Visit: Payer: Medicare Other | Admitting: "Endocrinology

## 2020-08-19 ENCOUNTER — Encounter (HOSPITAL_COMMUNITY): Payer: Self-pay

## 2020-08-19 ENCOUNTER — Ambulatory Visit (HOSPITAL_COMMUNITY): Payer: Medicare Other

## 2020-08-24 NOTE — Progress Notes (Signed)
Cardiology Office Note:   Date:  08/25/2020  NAME:  Carol Hardin    MRN: [id number] DOB:  05/07/1933   PCP:  Celene Squibb, MD  Cardiologist:  Carlyle Dolly, MD  Electrophysiologist:  Thompson Grayer, MD   Referring MD: Celene Squibb, MD   Chief Complaint  Patient presents with  . Follow-up   History of Present Illness:   Carol Hardin is a 86 y.o. female with a hx of systolic HF, moderate to severe MR, permanent Afib s/p AVN ablation, CVA who presents for follow-up. Started on coreg and has had issues tolerating GDMT.  She reports she is tolerating Coreg well.  She is not keen on taking medication it appears.  She still losing weight.  She takes Lasix as needed.  She does get some dizziness lightheadedness with her medication but is tolerating the Coreg well.  Kidney function is stable.  We discussed continuing the addition of guideline directed medical therapy such as a low-dose lisinopril.  She is okay to try this.  If her blood pressure cannot tolerate I recommended digoxin.  She want to let me know how it goes.  She not that active but does get short of breath when she does minimal activity.  She denies chest pain, shortness of breath or palpitations in office today.  Her husband is Carol Hardin who also is a patient of mine date of birth 02/07/1938  Echo 06/24/2010: Report reviewed; EF 55-60% with moderate MR and PMVL prolapse  Echo 08/26/2016: Afib with RVR, EF 40%, PMVL prolapse, myxomatous valve with moderate to severe MR Echo 05/20/2018: SB, EF 40%, PMVL prolapse, with moderate to severe MR Echo 11/19/2018: EF 35-40%, PMVL prolapse, with moderate to severe MR Echo 03/05/2020: EF 20%, Severely dilated LV, LVIDD 7.0 cm, LVOT VTI 12 cm, BIV Paced, Severe MR  Problem List 1. Mitral Valve prolapse (PMVL prolapse with moderate to severe MR in 2017) 2. Systolic HF, EF 81% -non-ischemic etiology? 3 SSS/Afib/Tachy-brady s/p CRT-P 4. Permanent Afib s/p AVN ablation  -intolerant  of rhythm control agents and AVN ablation pursued 5. CVA -05/2018  Past Medical History: Past Medical History:  Diagnosis Date  . Anxiety   . Arthritis    "fingers, right toe" (08/09/2016)  . Basal cell carcinoma of lower leg, right   . Bradycardia    a. Holter 08/30/16 showed profound bradycardia down to 30 during awake hours, several 2 second pauses, very frequent PVCs >8000 in 48 hours, and NSVT (longest of 14 beats).  . Cardiomyopathy ([place])    a. EF 40% by echo 08/2016.  . Daily headache    "I usually wake up w/a headache; sometimes it's a migraine" (08/09/2016)  . Depression   . Diverticulosis    Hx. of  . Frequent PVCs   . GERD (gastroesophageal reflux disease)   . Hypercholesterolemia   . Migraine   . Mitral regurgitation   . MVP (mitral valve prolapse)   . NSVT (nonsustained ventricular tachycardia) ([place])    a. first noted event monitor 08/2016.  . Osteoporosis   . PAF (paroxysmal atrial fibrillation) ([place])    a. in afib at time of echo 08/2016.  Marland Kitchen Pneumonia   . Scoliosis    mild  . Squamous cell carcinoma of neck     Past Surgical History: Past Surgical History:  Procedure Laterality Date  . AV NODE ABLATION N/A 01/23/2019   Procedure: AV NODE ABLATION;  Surgeon: Thompson Grayer, MD;  Location: [facility];  Service: Cardiovascular;  Laterality: N/A;  . BASAL CELL CARCINOMA EXCISION Right    RLE  . BIV PACEMAKER INSERTION CRT-P N/A 01/23/2019   Procedure: BIV PACEMAKER INSERTION CRT-P;  Surgeon: Thompson Grayer, MD;  Location: [facility];  Service: Cardiovascular;  Laterality: N/A;  . BLEPHAROPLASTY    . BREAST BIOPSY Left   . BREAST CYST ASPIRATION Left   . BREAST CYST EXCISION Left   . BUNIONECTOMY Bilateral   . DILATION AND CURETTAGE OF UTERUS    . EXCISIONAL HEMORRHOIDECTOMY    . INTRAMEDULLARY (IM) NAIL INTERTROCHANTERIC Left 12/31/2017   Procedure: INTRAMEDULLARY (IM) NAIL INTERTROCHANTRIC;  Surgeon: Nicholes Stairs, MD;  Location: [facility];  Service: Orthopedics;  Laterality: Left;  . KNEE ARTHROSCOPY Right 04/12/2007  . KNEE ARTHROSCOPY Left   . SQUAMOUS CELL CARCINOMA EXCISION     "neck"  . TONSILLECTOMY      Current Medications: Current Meds  Medication Sig  . Calcium Carbonate Antacid (TUMS PO) Take by mouth as needed.  . docusate sodium (COLACE) 100 MG capsule Take 100 mg by mouth daily as needed for mild constipation.  Marland Kitchen ELIQUIS 2.5 MG TABS tablet TAKE 1 TABLET(2.5 MG) BY MOUTH TWICE DAILY  . Emollient (MEDERMA AG FACE) CREA Apply 1 application topically daily.  . Ensure (ENSURE) Take 237 mLs by mouth daily.   . furosemide (LASIX) 20 MG tablet Take 20 mg by mouth as needed for fluid.  Marland Kitchen LORazepam (ATIVAN) 1 MG tablet Take 1 mg by mouth at bedtime.  [name] Glycol-Propyl Glycol (SYSTANE OP) Apply to eye as needed.  . Probiotic Product (ALIGN PO) Take by mouth daily.  . Psyllium (METAMUCIL FIBER PO) Take by mouth daily. 1 1/2 teaspoons daily     Allergies:    Omeprazole, Flagyl [metronidazole], Amiodarone, Aripiprazole, Hylan g-f 20, Paroxetine hcl, Statins, Sulfa antibiotics, Toprol xl [metoprolol succinate], Vancomycin, and Penicillins   Social History: Social History   Socioeconomic History  . Marital status: Married    Spouse name: Not on file  . Number of children: Not on file  . Years of education: Not on file  . Highest education level: Not on file  Occupational History  . Not on file  Tobacco Use  . Smoking status: Former Smoker    Packs/day: 1.00    Years: 20.00    Pack years: 20.00    Types: Cigarettes    Quit date: 1989    Years since quitting: 32.8  . Smokeless tobacco: Never Used  Vaping Use  . Vaping Use: Never used  Substance and Sexual Activity  . Alcohol use: No  . Drug use: No  . Sexual activity: Not Currently  Other Topics Concern  . Not on file  Social History Narrative  . Not on file   Social Determinants of Health   Financial Resource Strain:   . Difficulty of  Paying Living Expenses: Not on file  Food Insecurity:   . Worried About Charity fundraiser in the Last Year: Not on file  . Ran Out of Food in the Last Year: Not on file  Transportation Needs:   . Lack of Transportation (Medical): Not on file  . Lack of Transportation (Non-Medical): Not on file  Physical Activity:   . Days of Exercise per Week: Not on file  . Minutes of Exercise per Session: Not on file  Stress:   . Feeling of Stress : Not on file  Social Connections:   . Frequency  of Communication with Friends and Family: Not on file  . Frequency of Social Gatherings with Friends and Family: Not on file  . Attends Religious Services: Not on file  . Active Member of Clubs or Organizations: Not on file  . Attends Archivist Meetings: Not on file  . Marital Status: Not on file     Family History: The patient's family history includes Heart failure in her father and mother; Leukemia in her mother. There is no history of Allergic rhinitis, Angioedema, Asthma, Eczema, Atopy, Immunodeficiency, or Urticaria.  ROS:   All other ROS reviewed and negative. Pertinent positives noted in the HPI.     EKGs/Labs/Other Studies Reviewed:   The following studies were personally reviewed by me today:  TTE 03/05/2020 1. Left ventricular ejection fraction, by estimation, is 20%. The left  ventricle has severely decreased function. The left ventricle demonstrates  regional wall motion abnormalities (see scoring diagram/findings for  description). The left ventricular  internal cavity size was moderately dilated. Left ventricular diastolic  parameters are indeterminate. Elevated left atrial pressure.  2. Right ventricular systolic function is moderately reduced. The right  ventricular size is moderately enlarged. There is mildly elevated  pulmonary artery systolic pressure.  3. Left atrial size was severely dilated.  4. Right atrial size was moderately dilated.  5. The mitral valve  is degenerative. Moderate to severe mitral valve  regurgitation.  6. Tricuspid valve regurgitation is moderate.  7. The aortic valve is tricuspid. Aortic valve regurgitation is mild. No  aortic stenosis is present.  8. The inferior vena cava is dilated in size with <50% respiratory  variability, suggesting right atrial pressure of 15 mmHg.   Recent Labs: 01/07/2020: Pro B Natriuretic peptide (BNP) 569.0 04/23/2020: ALT 21; BNP 444.6; Hemoglobin 14.0; Platelets 198; Potassium 4.9; Sodium 140 08/11/2020: BUN 17; Creatinine 0.9; TSH 3.66   Recent Lipid Panel    Component Value Date/Time   CHOL 134 08/11/2020 0000   TRIG 67 08/11/2020 0000   HDL 41 08/11/2020 0000   CHOLHDL 3.3 01/23/2019 0631   VLDL 13 01/23/2019 0631   LDLCALC 79 08/11/2020 0000    Physical Exam:   VS:  BP 92/70 (BP Location: Left Arm, Patient Position: Sitting, Cuff Size: Normal)   Pulse 72   Ht 5\' 4"  (1.626 m)   Wt 122 lb (55.3 kg)   BMI 20.94 kg/m    Wt Readings from Last 3 Encounters:  08/25/20 122 lb (55.3 kg)  08/12/20 123 lb 12.8 oz (56.2 kg)  04/28/20 125 lb 6.4 oz (56.9 kg)    General: Well nourished, well developed, in no acute distress Heart: Atraumatic, normal size  Eyes: PEERLA, EOMI  Neck: Supple, no JVD Endocrine: No thryomegaly Cardiac: Normal S1, S2; RRR; no murmurs, rubs, or gallops Lungs: Clear to auscultation bilaterally, no wheezing, rhonchi or rales  Abd: Soft, nontender, no hepatomegaly  Ext: No edema, pulses 2+ Musculoskeletal: No deformities, BUE and BLE strength normal and equal Skin: Warm and dry, no rashes   Neuro: Alert and oriented to person, place, time, and situation, CNII-XII grossly intact, no focal deficits  Psych: Normal mood and affect   ASSESSMENT:   Carol Hardin is a 86 y.o. female who presents for the following: 1. Nonischemic cardiomyopathy ([place])   2. Chronic systolic heart failure (H[facility])   3. Permanent atrial fibrillation ([place])   4. Pacemaker      PLAN:   1. Nonischemic cardiomyopathy ([place]) 2. Chronic systolic heart failure (HCC) -  She has had worsening heart failure for several years.  She was reported to have a myxomatous mitral valve.  She was also reported have moderate severe MR.  She has no murmur suggestive of severe mitral regurgitation on examination today.  She developed a cardiomyopathy progressively.  No blockages per reports.  She has been intolerant of much of her medical therapy.  It appears she also does not like to take medications.  I do wonder if this is the real reason for her not tolerating medications.  Nonetheless, she is tolerating Coreg 3.125 mg twice daily.  I recommended she continue with Lasix as needed.  She is euvolemic today.  We will try to add lisinopril 2.5 mg daily.  If she cannot tolerate this we will likely have to add digoxin. -Overall given her age of 67 years, I do not see a need to get super aggressive with her therapy.  We will try what we can do.  3. Permanent atrial fibrillation ([place]) 4. Pacemaker -Status post AV nodal ablation.  On Eliquis 2.5 mg twice daily.  Cardiomyopathy has not improved after CRT.  Disposition: Return in about 2 months (around 10/25/2020).  Medication Adjustments/Labs and Tests Ordered: Current medicines are reviewed at length with the patient today.  Concerns regarding medicines are outlined above.  No orders of the defined types were placed in this encounter.  Meds ordered this encounter  Medications  . DISCONTD: lisinopril (ZESTRIL) 2.5 MG tablet    Sig: Take 1 tablet (2.5 mg total) by mouth daily.    Dispense:  90 tablet    Refill:  3    Patient Instructions  Medication Instructions:  START LISINOPRIL 2.5 MG EVERY DAY *If you need a refill on your cardiac medications before your next appointment, please call your pharmacy*   Lab Work: NONE If you have labs (blood work) drawn today and your tests are completely normal, you will receive your results only  by: Marland Kitchen MyChart Message (if you have MyChart) OR . A paper copy in the mail If you have any lab test that is abnormal or we need to change your treatment, we will call you to review the results.   Testing/Procedures: NONE   Follow-Up: At [facility], you and your health needs are our priority.  As part of our continuing mission to provide you with exceptional heart care, we have created designated Provider Care Teams.  These Care Teams include your primary Cardiologist (physician) and Advanced Practice Providers (APPs -  Physician Assistants and Nurse Practitioners) who all work together to provide you with the care you need, when you need it.  We recommend signing up for the patient portal called "MyChart".  Sign up information is provided on this After Visit Summary.  MyChart is used to connect with patients for Virtual Visits (Telemedicine).  Patients are able to view lab/test results, encounter notes, upcoming appointments, etc.  Non-urgent messages can be sent to your provider as well.   To learn more about what you can do with MyChart, go to [email or web address].    Your next appointment:   2 month(s)  The format for your next appointment:   In Person  Provider:   Eleonore Chiquito, MD   Other Instructions NONE     Time Spent with Patient: I have spent a total of 25 minutes with patient reviewing hospital notes, telemetry, EKGs, labs and examining the patient as well as establishing an assessment and plan that was discussed with the patient.  >  50% of time was spent in direct patient care.  Signed, Addison Naegeli. Audie Box, [place]  [address] [place], [place] [address] [phone]  08/25/2020 3:52 PM

## 2020-08-25 ENCOUNTER — Other Ambulatory Visit: Payer: Self-pay | Admitting: *Deleted

## 2020-08-25 ENCOUNTER — Encounter: Payer: Self-pay | Admitting: Cardiovascular Disease

## 2020-08-25 ENCOUNTER — Ambulatory Visit (INDEPENDENT_AMBULATORY_CARE_PROVIDER_SITE_OTHER): Payer: Medicare Other | Admitting: Cardiovascular Disease

## 2020-08-25 VITALS — BP 92/70 | HR 72 | Ht 64.0 in | Wt 122.0 lb

## 2020-08-25 DIAGNOSIS — I5022 Chronic systolic (congestive) heart failure: Secondary | ICD-10-CM | POA: Diagnosis not present

## 2020-08-25 DIAGNOSIS — I4821 Permanent atrial fibrillation: Secondary | ICD-10-CM

## 2020-08-25 DIAGNOSIS — Z950 Presence of cardiac pacemaker: Secondary | ICD-10-CM

## 2020-08-25 DIAGNOSIS — I428 Other cardiomyopathies: Secondary | ICD-10-CM | POA: Diagnosis not present

## 2020-08-25 MED ORDER — LISINOPRIL 2.5 MG PO TABS: 2.5000 mg | ORAL_TABLET | Freq: Every day | ORAL | 3 refills | Status: DC

## 2020-08-25 MED ORDER — LISINOPRIL 2.5 MG PO TABS: 2.5000 mg | ORAL_TABLET | Freq: Every day | ORAL | 1 refills | Status: DC

## 2020-08-25 NOTE — Patient Instructions (Signed)
Medication Instructions:  START LISINOPRIL 2.5 MG EVERY DAY *If you need a refill on your cardiac medications before your next appointment, please call your pharmacy*   Lab Work: NONE If you have labs (blood work) drawn today and your tests are completely normal, you will receive your results only by: Marland Kitchen MyChart Message (if you have MyChart) OR . A paper copy in the mail If you have any lab test that is abnormal or we need to change your treatment, we will call you to review the results.   Testing/Procedures: NONE   Follow-Up: At [facility], you and your health needs are our priority.  As part of our continuing mission to provide you with exceptional heart care, we have created designated Provider Care Teams.  These Care Teams include your primary Cardiologist (physician) and Advanced Practice Providers (APPs -  Physician Assistants and Nurse Practitioners) who all work together to provide you with the care you need, when you need it.  We recommend signing up for the patient portal called "MyChart".  Sign up information is provided on this After Visit Summary.  MyChart is used to connect with patients for Virtual Visits (Telemedicine).  Patients are able to view lab/test results, encounter notes, upcoming appointments, etc.  Non-urgent messages can be sent to your provider as well.   To learn more about what you can do with MyChart, go to [email or web address].    Your next appointment:   2 month(s)  The format for your next appointment:   In Person  Provider:   Eleonore Chiquito, MD   Other Instructions NONE

## 2020-08-26 DIAGNOSIS — Z96641 Presence of right artificial hip joint: Secondary | ICD-10-CM | POA: Insufficient documentation

## 2020-08-26 DIAGNOSIS — M810 Age-related osteoporosis without current pathological fracture: Secondary | ICD-10-CM | POA: Insufficient documentation

## 2020-08-26 DIAGNOSIS — K649 Unspecified hemorrhoids: Secondary | ICD-10-CM | POA: Insufficient documentation

## 2020-08-27 ENCOUNTER — Ambulatory Visit (HOSPITAL_COMMUNITY): Payer: Medicare Other | Attending: Orthopaedic Surgery

## 2020-08-27 ENCOUNTER — Encounter (HOSPITAL_COMMUNITY): Payer: Self-pay

## 2020-08-27 ENCOUNTER — Other Ambulatory Visit: Payer: Self-pay

## 2020-08-27 DIAGNOSIS — M25541 Pain in joints of right hand: Secondary | ICD-10-CM | POA: Diagnosis present

## 2020-08-27 DIAGNOSIS — R29898 Other symptoms and signs involving the musculoskeletal system: Secondary | ICD-10-CM | POA: Insufficient documentation

## 2020-08-27 NOTE — Therapy (Addendum)
[facility], [place], [address] Phone: [phone]   Fax:  [phone]  Occupational Therapy Evaluation  Patient Details  Name: Carol Hardin MRN: [id number] Date of Birth: 17-Oct-1932 Referring Provider (OT): Harlin Heys, MD   Encounter Date: 08/27/2020   OT End of Session - 08/27/20 1803    Visit Number 1    Number of Visits 4    Date for OT Re-Evaluation 09/24/20    Authorization Type UHC Medicare    Authorization Time Period No visit limit. No auth needed    Progress Note Due on Visit 10    OT Start Time 1517    OT Stop Time 1600    OT Time Calculation (min) 43 min    Activity Tolerance Patient tolerated treatment well    Behavior During Therapy WFL for tasks assessed/performed           Past Medical History:  Diagnosis Date  . Anxiety   . Arthritis    "fingers, right toe" (08/09/2016)  . Basal cell carcinoma of lower leg, right   . Bradycardia    a. Holter 08/30/16 showed profound bradycardia down to 30 during awake hours, several 2 second pauses, very frequent PVCs >8000 in 48 hours, and NSVT (longest of 14 beats).  . Cardiomyopathy ([place])    a. EF 40% by echo 08/2016.  . Daily headache    "I usually wake up w/a headache; sometimes it's a migraine" (08/09/2016)  . Depression   . Diverticulosis    Hx. of  . Frequent PVCs   . GERD (gastroesophageal reflux disease)   . Hypercholesterolemia   . Migraine   . Mitral regurgitation   . MVP (mitral valve prolapse)   . NSVT (nonsustained ventricular tachycardia) ([place])    a. first noted event monitor 08/2016.  . Osteoporosis   . PAF (paroxysmal atrial fibrillation) ([place])    a. in afib at time of echo 08/2016.  Marland Kitchen Pneumonia   . Scoliosis    mild  . Squamous cell carcinoma of neck     Past Surgical History:  Procedure Laterality Date  . AV NODE ABLATION N/A 01/23/2019   Procedure: AV NODE ABLATION;  Surgeon: Thompson Grayer, MD;  Location:  [facility];  Service: Cardiovascular;  Laterality: N/A;  . BASAL CELL CARCINOMA EXCISION Right    RLE  . BIV PACEMAKER INSERTION CRT-P N/A 01/23/2019   Procedure: BIV PACEMAKER INSERTION CRT-P;  Surgeon: Thompson Grayer, MD;  Location: [facility];  Service: Cardiovascular;  Laterality: N/A;  . BLEPHAROPLASTY    . BREAST BIOPSY Left   . BREAST CYST ASPIRATION Left   . BREAST CYST EXCISION Left   . BUNIONECTOMY Bilateral   . DILATION AND CURETTAGE OF UTERUS    . EXCISIONAL HEMORRHOIDECTOMY    . INTRAMEDULLARY (IM) NAIL INTERTROCHANTERIC Left 12/31/2017   Procedure: INTRAMEDULLARY (IM) NAIL INTERTROCHANTRIC;  Surgeon: Nicholes Stairs, MD;  Location: [facility];  Service: Orthopedics;  Laterality: Left;  . KNEE ARTHROSCOPY Right 04/12/2007  . KNEE ARTHROSCOPY Left   . SQUAMOUS CELL CARCINOMA EXCISION     "neck"  . TONSILLECTOMY      There were no vitals filed for this visit.   Subjective Assessment - 08/27/20 1530    Subjective  S: It feels like it pops when I make a fist.    Pertinent History Patient is a 86 y/o female S/P right small finger proximal phalanx fracture which occurred on  06/27/20 from a mechanical fall. Patient is referred by Dr. Harlin Heys for OT evaluation and treatment.    Patient Stated Goals To increase her ability to use her right hand for all daily tasks.    Currently in Pain? No/denies   Reports pain with gripping tasks 8/10.            [facility] OT Assessment - 08/27/20 1530      Assessment   Medical Diagnosis S/P right small finger proximal phalanx fracture    Referring Provider (OT) Harlin Heys, MD    Onset Date/Surgical Date 06/27/20    Hand Dominance Right    Next MD Visit Nothing scheduled    Prior Therapy None for the right hand.       Precautions   Precautions Other (comment)    Precaution Comments Follow [facility] Protocol. 7-8 weeks (11/5-11/12): Remove splint for 6-8 hours during day. Wear at night. No lifting over  10lbs. Light strengthening. Week 10-12 (11/26-12/10): Progress as tolerated. No tight sustained gripping activities against resistance. No heavy manual labor.     Required Braces or Orthoses Other Brace/Splint    Other Brace/Splint Gutter splint      Restrictions   Weight Bearing Restrictions No      Balance Screen   Has the patient fallen in the past 6 months Yes    How many times? 1    Has the patient had a decrease in activity level because of a fear of falling?  No    Is the patient reluctant to leave their home because of a fear of falling?  No      Home  Environment   Family/patient expects to be discharged to: Private residence    Living Arrangements Spouse/significant other      Prior Function   Level of [place] Retired      ADL   ADL comments Difficulty utilizing right hand for self care tasks such as brushing teeth, manipulate buttons on coar, any grasp epecially when required to use the small finger.       Mobility   Mobility Status History of falls      Written Expression   Dominant Hand Right      Vision - History   Baseline Vision Wears glasses all the time      Cognition   Overall Cognitive Status Within Functional Limits for tasks assessed      Observation/Other Assessments   Observations Arthritic changes in the both hands noted with increased joint size for MCP, PIP, and DIPs.      Coordination   Gross Motor Movements are Fluid and Coordinated Yes    Fine Motor Movements are Fluid and Coordinated Yes    Coordination and Movement Description Test 9 hole peg test at next session.     Tremors None      Edema   Edema No edema      ROM / Strength   AROM / PROM / Strength Strength;AROM      Palpation   Palpation comment Trace fascial restrictions noted on volar aspect of hand from MCP joint to proximal to wrist crease      AROM   Overall AROM Comments 28 degree extensor lag small finger PIP joint. Able to achieve full  passive PIP extension small fingerr.      Strength   Strength Assessment Site Hand    Right/Left hand Right;Left    Right Hand Grip (lbs) 40  Right Hand Lateral Pinch 12 lbs    Left Hand Grip (lbs) 45    Left Hand Lateral Pinch 12 lbs      Right Hand AROM   R Little  MCP 0-90 80 Degrees    R Little PIP 0-100 90 Degrees    R Little DIP 0-70 40 Degrees                           OT Education - 08/27/20 1757    Education Details yellow putty - grip and lateral pinch. small finger table tap    Person(s) Educated Patient    Methods Explanation;Demonstration;Handout    Comprehension Verbalized understanding;Returned demonstration            OT Short Term Goals - 08/27/20 1810      OT SHORT TERM GOAL #1   Title Patient will be educated and independent with HEP in order to increase functional use of her right hand and return to using it as her dominant extremity for all daily tasks.    Time 4    Period Weeks    Status New    Target Date 09/24/20      OT SHORT TERM GOAL #2   Title Patient will decrease extensor lag in right small DIP joint to 15 degrees or less in while demonstrating increased extensor strength.    Time 4    Period Weeks    Status New      OT SHORT TERM GOAL #3   Title Patient will increase right hand grip strength by 5# in order to increase ability to grasp lightweight items and open jars/containers with less difficulty.    Time 4    Period Weeks    Status New      OT SHORT TERM GOAL #4   Title Patient will report a decrease in pain while completing gripping tasks using her right hand which will allow her to increase her use to 100%.    Time 4    Period Weeks    Status New                    Plan - 08/27/20 1807    Clinical Impression Statement A: Patient is a 86 y/o female S/P right small finger proximal phalanx fracture causing increased pain, fascial restrictions, and decreased ROM and strength resulting in difficulty  completing daily tasks using her right hand as dominant.    OT Occupational Profile and History Problem Focused Assessment - Including review of records relating to presenting problem    Occupational performance deficits (Please refer to evaluation for details): ADL's;Leisure    Body Structure / Function / Physical Skills ADL;Strength;Pain;UE functional use;ROM;Fascial restriction    Rehab Potential Excellent    Clinical Decision Making Limited treatment options, no task modification necessary    Comorbidities Affecting Occupational Performance: May have comorbidities impacting occupational performance    Modification or Assistance to Complete Evaluation  No modification of tasks or assist necessary to complete eval    OT Frequency 1x / week    OT Duration 4 weeks    OT Treatment/Interventions Self-care/ADL training;Moist Heat;Splinting;Therapeutic activities;Electrical Stimulation;Neuromuscular education;Paraffin;Cryotherapy;Ultrasound;Therapeutic exercise;Passive range of motion;Manual Therapy;Patient/family education    Plan P: Patient will benefit from skilled OT services to increase functional performance while using her right hand as her dominant for all activities. Treatment Plan: Central nerve blocking exercises. Fabricate Night PIP extension gutter splint. NMES to [facility] and  interossei with dual channel set-up    OT Home Exercise Plan eval: yellow putty, finger taps    Consulted and Agree with Plan of Care Patient           Patient will benefit from skilled therapeutic intervention in order to improve the following deficits and impairments:   Body Structure / Function / Physical Skills: ADL, Strength, Pain, UE functional use, ROM, Fascial restriction       Visit Diagnosis: Other symptoms and signs involving the musculoskeletal system - Plan: Ot plan of care cert/re-cert  Pain in joint of right hand - Plan: Ot plan of care cert/re-cert    Problem List Patient Active Problem  List   Diagnosis Date Noted  . Muscular deconditioning 01/07/2020  . Dysphagia 03/30/2019  . Constipation 03/30/2019  . Pacemaker   . Chronic respiratory failure with hypoxia ([place])   . On home O2   . Sick sinus syndrome ([place]) 01/22/2019  . CKD (chronic kidney disease) stage 3, GFR 30-59 ml/min (HCC) 01/21/2019  . Atrial fibrillation with RVR ([place]) 01/20/2019  . Acute on chronic systolic CHF (congestive heart failure) ([place]) 12/25/2018  . Tachy-brady syndrome ([place]) 12/25/2018  . Atrial fibrillation, chronic ([place]) 12/24/2018  . Congestive heart failure with left ventricular diastolic dysfunction, unspecified failure chronicity ([place]) 12/24/2018  . Malnutrition of moderate degree 11/20/2018  . Atrial fibrillation with rapid ventricular response ([place]) 11/18/2018  . GERD (gastroesophageal reflux disease) 11/18/2018  . CAP (community acquired pneumonia) 11/18/2018  . Acute on chronic diastolic HF (heart failure) ([place]) 11/18/2018  . Stroke (cerebrum) ([place]) 05/19/2018  . Hip fracture ([place]) 12/31/2017  . PAF (paroxysmal atrial fibrillation) ([place])   . NSVT (nonsustained ventricular tachycardia) ([place])   . Mitral regurgitation   . Frequent PVCs   . Bradycardia   . Essential hypertension   . Hypokalemia   . Diverticulitis 08/09/2016  . Diverticulosis of colon 04/22/2016  . Diverticulosis of large intestine without perforation or abscess without bleeding 04/22/2016  . Generalized anxiety disorder 04/22/2016  . Irritable bowel syndrome without diarrhea 04/22/2016  . Mitral valve prolapse 04/22/2016  . Mixed hyperlipidemia 04/22/2016  . Osteopenia 04/22/2016  . Low blood pressure 09/24/2014  . Hypercholesterolemia   . History of depression   . Scoliosis   . Osteoporosis   . DOE (dyspnea on exertion) 05/31/2008   Ailene Ravel, OTR/L,CBIS  [phone]  08/27/2020, 6:18 PM  [facility] [address] [place], [place], [address] Phone:  [phone]   Fax:  [phone]  Name: Carol Hardin MRN: [id number] Date of Birth: Oct 30, 1932

## 2020-08-27 NOTE — Patient Instructions (Signed)
Home Exercises Program Theraputty Exercises  Do the following exercises 1-2 times a day using your affected hand.    PUTTY GRIP  Place putty in your hand and squeeze it firmly and slowly. Reshape it and repeat.    Complete for 3-5 minutes     Putty Lateral Pinch  With your hand on its side and fingers in a fist, place the putty between your thumb and the side of your index finger. Pinch the putty as you would if you were holding  key.     Complete for 3-5 minutes      Small Finger Tap on table. Complete 10-20 times.     *Don't lift anything over 10 lbs in right hand.  *May remove splint for 6-8 hours during the day. Continue to wear splint at night when sleeping.

## 2020-08-29 ENCOUNTER — Telehealth: Payer: Self-pay | Admitting: Cardiovascular Disease

## 2020-08-29 NOTE — Telephone Encounter (Signed)
Pt c/o medication issue:  1. Name of Medication: lisinopril (ZESTRIL) 2.5 MG tablet  2. How are you currently taking this medication (dosage and times per day)? Not currently taking   3. Are you having a reaction (difficulty breathing--STAT)? No    4. What is your medication issue? Carol Hardin is calling stating she has not picked up this medication from the pharmacy due to her son telling her it's for high BP when she has low BP. Carol Hardin states she is scared to take this medication and is wanting to know if Dr. Audie Box would consider prescribing the other medication he mentioned at her last appointment as an alternative. Please advise.

## 2020-08-29 NOTE — Telephone Encounter (Signed)
Returned call to patient-she had OV 11/15 with Dr. Audie Box and was started on lisinopril 2.5 mg but she has not picked this up and has "reservations" about taking this medication.  She is wondering if the "2nd" medication the MD discussed would be a better option.     Per chart review: Hx: NICM, systolic HF, MR, AFIb s/p ablation, CVA Echo 2021 EF 20% Per note: We discussed continuing the addition of guideline directed medical therapy such as a low-dose lisinopril.  She is okay to try this.  If her blood pressure cannot tolerate I recommended digoxin.     She is concerned about this dropping her BP and why she needs this medication.  Explained what medication is for and why MD would like her to try this.   Discussed monitoring BP at home and monitoring for symptoms of low BP.    After discussion with patient, she understands the benefit of taking medication if she is able and agreed to try this.   She will monitor BP and for symptoms and notify us with any concerns or issues.

## 2020-08-29 NOTE — Telephone Encounter (Signed)
Thanks. She just doesn't want to take medications. -W

## 2020-09-02 ENCOUNTER — Other Ambulatory Visit: Payer: Self-pay

## 2020-09-02 ENCOUNTER — Ambulatory Visit (HOSPITAL_COMMUNITY): Payer: Medicare Other

## 2020-09-02 DIAGNOSIS — R29898 Other symptoms and signs involving the musculoskeletal system: Secondary | ICD-10-CM

## 2020-09-02 DIAGNOSIS — M25541 Pain in joints of right hand: Secondary | ICD-10-CM

## 2020-09-02 NOTE — Patient Instructions (Signed)
Tendon Gliding Exercises: Complete 10X, hold for 3-5 seconds, 1-2x per day  1) Straight: begin with wrist in extended position and fingers straight      2) Hook: Bend your fingers making them look like a hook while keeping your thumb straight.      3) Fist: Make your hand into a fist.      4) Table Top: Straighten your fingers straight out making them look like a table top.      5) Straight Fist: Bend your fingers straight down into a straight fist.    

## 2020-09-02 NOTE — Therapy (Signed)
[place] [place], [place], [address] Phone: [phone]   Fax:  [phone]  Occupational Therapy Treatment  Patient Details  Name: Carol Hardin MRN: [id number] Date of Birth: [birth year] Referring Provider (OT): Harlin Heys, MD   Encounter Date: 09/02/2020   OT End of Session - 09/02/20 1713    Visit Number 2    Number of Visits 4    Date for OT Re-Evaluation 09/24/20    Authorization Type UHC Medicare    Authorization Time Period No visit limit. No auth needed    Progress Note Due on Visit 10    OT Start Time 1605    OT Stop Time 1640    OT Time Calculation (min) 35 min    Activity Tolerance Patient tolerated treatment well    Behavior During Therapy WFL for tasks assessed/performed           Past Medical History:  Diagnosis Date  . Anxiety   . Arthritis    "fingers, right toe" (08/09/2016)  . Basal cell carcinoma of lower leg, right   . Bradycardia    a. Holter 08/30/16 showed profound bradycardia down to 30 during awake hours, several 2 second pauses, very frequent PVCs >8000 in 48 hours, and NSVT (longest of 14 beats).  . Cardiomyopathy ([place])    a. EF 40% by echo 08/2016.  . Daily headache    "I usually wake up w/a headache; sometimes it's a migraine" (08/09/2016)  . Depression   . Diverticulosis    Hx. of  . Frequent PVCs   . GERD (gastroesophageal reflux disease)   . Hypercholesterolemia   . Migraine   . Mitral regurgitation   . MVP (mitral valve prolapse)   . NSVT (nonsustained ventricular tachycardia) ([place])    a. first noted event monitor 08/2016.  . Osteoporosis   . PAF (paroxysmal atrial fibrillation) ([place])    a. in afib at time of echo 08/2016.  Marland Kitchen Pneumonia   . Scoliosis    mild  . Squamous cell carcinoma of neck     Past Surgical History:  Procedure Laterality Date  . AV NODE ABLATION N/A 01/23/2019   Procedure: AV NODE ABLATION;  Surgeon: Thompson Grayer, MD;  Location: [facility];  Service: Cardiovascular;  Laterality: N/A;  . BASAL CELL CARCINOMA EXCISION Right    RLE  . BIV PACEMAKER INSERTION CRT-P N/A 01/23/2019   Procedure: BIV PACEMAKER INSERTION CRT-P;  Surgeon: Thompson Grayer, MD;  Location: [facility];  Service: Cardiovascular;  Laterality: N/A;  . BLEPHAROPLASTY    . BREAST BIOPSY Left   . BREAST CYST ASPIRATION Left   . BREAST CYST EXCISION Left   . BUNIONECTOMY Bilateral   . DILATION AND CURETTAGE OF UTERUS    . EXCISIONAL HEMORRHOIDECTOMY    . INTRAMEDULLARY (IM) NAIL INTERTROCHANTERIC Left 12/31/2017   Procedure: INTRAMEDULLARY (IM) NAIL INTERTROCHANTRIC;  Surgeon: Nicholes Stairs, MD;  Location: ;  Service: Orthopedics;  Laterality: Left;  . KNEE ARTHROSCOPY Right 04/12/2007  . KNEE ARTHROSCOPY Left   . SQUAMOUS CELL CARCINOMA EXCISION     "neck"  . TONSILLECTOMY      There were no vitals filed for this visit.       [facility] OT Assessment - 09/02/20 1631      Assessment   Medical Diagnosis S/P right small finger proximal phalanx fracture      Precautions   Precautions Other (comment)    Precaution  Comments Follow [facility] Protocol. 7-8 weeks (11/5-11/12): Remove splint for 6-8 hours during day. Wear at night. No lifting over 10lbs. Light strengthening. Week 10-12 (11/26-12/10): Progress as tolerated. No tight sustained gripping activities against resistance. No heavy manual labor.     Required Braces or Orthoses Other Brace/Splint    Other Brace/Splint Gutter splint                    OT Treatments/Exercises (OP) - 09/02/20 1634      Exercises   Exercises Hand;Theraputty      Hand Exercises   Other Hand Exercises Tendon glides; 10X; 3 second hold each      Theraputty   Theraputty - Flatten yellow-standing    Theraputty - Roll yellow     Theraputty - Grip yellow      Manual Therapy   Manual Therapy Soft tissue mobilization    Manual therapy comments Manual therapy completed  prior to exercises    Soft tissue mobilization Soft tissue mobilization completed to right small finger, dorsal and palmer aspects of hand to decrease fascial restrictions and increase joint mobility.                   OT Education - 09/02/20 1636    Education Details tendon glides. reviewed therapy goals. Discussed focusing on strength primarily. No gutter splint wanted at this time. Reviewed protocol and informed patient she may remove splint at night starting on Friday.    Person(s) Educated Patient    Methods Explanation;Handout    Comprehension Verbalized understanding            OT Short Term Goals - 09/02/20 1720      OT SHORT TERM GOAL #1   Title Patient will be educated and independent with HEP in order to increase functional use of her right hand and return to using it as her dominant extremity for all daily tasks.    Time 4    Period Weeks    Status On-going    Target Date 09/24/20      OT SHORT TERM GOAL #2   Title Patient will decrease extensor lag in right small DIP joint to 15 degrees or less in while demonstrating increased extensor strength.    Time 4    Period Weeks    Status On-going      OT SHORT TERM GOAL #3   Title Patient will increase right hand grip strength by 5# in order to increase ability to grasp lightweight items and open jars/containers with less difficulty.    Time 4    Period Weeks    Status On-going      OT SHORT TERM GOAL #4   Title Patient will report a decrease in pain while completing gripping tasks using her right hand which will allow her to increase her use to 100%.    Time 4    Period Weeks    Status On-going                    Plan - 09/02/20 1716    Clinical Impression Statement A: Discussed overall goals of therapy. patient mentioned she is more focused on functional use and being able to return to playing the piano and just doing what she would normaly do with her right hand. New gutter splint for digit  extension not fabricated this date. Focused on gentle strengthening overall during session. Added tendon glides to HEP and completed during session.  VC for form and technique completed.    Body Structure / Function / Physical Skills ADL;Strength;Pain;UE functional use;ROM;Fascial restriction    Plan P: progress strengthening as tolerated. Handgripper task. NMES an option.    Consulted and Agree with Plan of Care Patient           Patient will benefit from skilled therapeutic intervention in order to improve the following deficits and impairments:   Body Structure / Function / Physical Skills: ADL, Strength, Pain, UE functional use, ROM, Fascial restriction       Visit Diagnosis: Other symptoms and signs involving the musculoskeletal system  Pain in joint of right hand    Problem List Patient Active Problem List   Diagnosis Date Noted  . Muscular deconditioning 01/07/2020  . Dysphagia 03/30/2019  . Constipation 03/30/2019  . Pacemaker   . Chronic respiratory failure with hypoxia ([place])   . On home O2   . Sick sinus syndrome ([place]) 01/22/2019  . CKD (chronic kidney disease) stage 3, GFR 30-59 ml/min (H[facility]) 01/21/2019  . Atrial fibrillation with RVR ([place]) 01/20/2019  . Acute on chronic systolic CHF (congestive heart failure) ([place]) 12/25/2018  . Tachy-brady syndrome ([place]) 12/25/2018  . Atrial fibrillation, chronic ([place]) 12/24/2018  . Congestive heart failure with left ventricular diastolic dysfunction, unspecified failure chronicity ([place]) 12/24/2018  . Malnutrition of moderate degree 11/20/2018  . Atrial fibrillation with rapid ventricular response ([place]) 11/18/2018  . GERD (gastroesophageal reflux disease) 11/18/2018  . CAP (community acquired pneumonia) 11/18/2018  . Acute on chronic diastolic HF (heart failure) ([place]) 11/18/2018  . Stroke (cerebrum) ([place]) 05/19/2018  . Hip fracture ([place]) 12/31/2017  . PAF (paroxysmal atrial fibrillation) ([place])   . NSVT (nonsustained  ventricular tachycardia) ([place])   . Mitral regurgitation   . Frequent PVCs   . Bradycardia   . Essential hypertension   . Hypokalemia   . Diverticulitis 08/09/2016  . Diverticulosis of colon 04/22/2016  . Diverticulosis of large intestine without perforation or abscess without bleeding 04/22/2016  . Generalized anxiety disorder 04/22/2016  . Irritable bowel syndrome without diarrhea 04/22/2016  . Mitral valve prolapse 04/22/2016  . Mixed hyperlipidemia 04/22/2016  . Osteopenia 04/22/2016  . Low blood pressure 09/24/2014  . Hypercholesterolemia   . History of depression   . Scoliosis   . Osteoporosis   . DOE (dyspnea on exertion) 05/31/2008    Jennessa Trigo, Clarene Duke 09/02/2020, 5:21 PM  [facility] [address] [place], [place], [address] Phone: [phone]   Fax:  [phone]  Name: Carol Hardin MRN: [id number] Date of Birth: 1933-10-03

## 2020-09-09 ENCOUNTER — Encounter (HOSPITAL_COMMUNITY): Payer: Self-pay

## 2020-09-09 ENCOUNTER — Other Ambulatory Visit: Payer: Self-pay

## 2020-09-09 ENCOUNTER — Ambulatory Visit (HOSPITAL_COMMUNITY): Payer: Medicare Other

## 2020-09-09 DIAGNOSIS — R29898 Other symptoms and signs involving the musculoskeletal system: Secondary | ICD-10-CM

## 2020-09-09 DIAGNOSIS — M25541 Pain in joints of right hand: Secondary | ICD-10-CM

## 2020-09-09 NOTE — Therapy (Signed)
[place], [place], [address] Phone: [phone]   Fax:  [phone]  Occupational Therapy Treatment  Patient Details  Name: Carol Hardin MRN: [id number] Date of Birth: 12-16-1932 Referring Provider (OT): Harlin Heys, MD   Encounter Date: 09/09/2020   OT End of Session - 09/09/20 1312    Visit Number 3    Number of Visits 4    Date for OT Re-Evaluation 09/24/20    Authorization Type [employer] Medicare    Authorization Time Period No visit limit. No auth needed    Progress Note Due on Visit 10    OT Start Time 1300    OT Stop Time 1338    OT Time Calculation (min) 38 min    Activity Tolerance Patient tolerated treatment well    Behavior During Therapy WFL for tasks assessed/performed           Past Medical History:  Diagnosis Date  . Anxiety   . Arthritis    "fingers, right toe" (08/09/2016)  . Basal cell carcinoma of lower leg, right   . Bradycardia    a. Holter 08/30/16 showed profound bradycardia down to 30 during awake hours, several 2 second pauses, very frequent PVCs >8000 in 48 hours, and NSVT (longest of 14 beats).  . Cardiomyopathy ([place])    a. EF 40% by echo 08/2016.  . Daily headache    "I usually wake up w/a headache; sometimes it's a migraine" (08/09/2016)  . Depression   . Diverticulosis    Hx. of  . Frequent PVCs   . GERD (gastroesophageal reflux disease)   . Hypercholesterolemia   . Migraine   . Mitral regurgitation   . MVP (mitral valve prolapse)   . NSVT (nonsustained ventricular tachycardia) ([place])    a. first noted event monitor 08/2016.  . Osteoporosis   . PAF (paroxysmal atrial fibrillation) ([place])    a. in afib at time of echo 08/2016.  Marland Kitchen Pneumonia   . Scoliosis    mild  . Squamous cell carcinoma of neck     Past Surgical History:  Procedure Laterality Date  . AV NODE ABLATION N/A 01/23/2019   Procedure: AV NODE ABLATION;  Surgeon: Thompson Grayer, MD;  Location: [facility];  Service: Cardiovascular;  Laterality: N/A;  . BASAL CELL CARCINOMA EXCISION Right    RLE  . BIV PACEMAKER INSERTION CRT-P N/A 01/23/2019   Procedure: BIV PACEMAKER INSERTION CRT-P;  Surgeon: Thompson Grayer, MD;  Location: [facility];  Service: Cardiovascular;  Laterality: N/A;  . BLEPHAROPLASTY    . BREAST BIOPSY Left   . BREAST CYST ASPIRATION Left   . BREAST CYST EXCISION Left   . BUNIONECTOMY Bilateral   . DILATION AND CURETTAGE OF UTERUS    . EXCISIONAL HEMORRHOIDECTOMY    . INTRAMEDULLARY (IM) NAIL INTERTROCHANTERIC Left 12/31/2017   Procedure: INTRAMEDULLARY (IM) NAIL INTERTROCHANTRIC;  Surgeon: Nicholes Stairs, MD;  Location: [facility];  Service: Orthopedics;  Laterality: Left;  . KNEE ARTHROSCOPY Right 04/12/2007  . KNEE ARTHROSCOPY Left   . SQUAMOUS CELL CARCINOMA EXCISION     "neck"  . TONSILLECTOMY      There were no vitals filed for this visit.   Subjective Assessment - 09/09/20 1306    Subjective  S: I know the exercises by hurt and I've been doing them. The last position on the paper hurts though.    Currently in Pain? No/denies  [facility] OT Assessment - 09/09/20 1307      Assessment   Medical Diagnosis S/P right small finger proximal phalanx fracture      Precautions   Precautions Other (comment)    Precaution Comments Follow [facility] Protocol. 7-8 weeks (11/5-11/12): Remove splint for 6-8 hours during day. Wear at night. No lifting over 10lbs. Light strengthening. Week 10-12 (11/26-12/10): Progress as tolerated. No tight sustained gripping activities against resistance. No heavy manual labor.                     OT Treatments/Exercises (OP) - 09/09/20 1309      Exercises   Exercises Hand;Theraputty      Hand Exercises   MCPJ Extension AROM;10 reps   starting from table top and lifting off- composite   Hand Gripper with Large Beads all beads with gripper set at 35#   vertical    Hand Gripper with  Medium Beads all beads with gripper set at 35#   vertical   Hand Gripper with Small Beads all beads with gripper set at 35#   horizontal   Other Hand Exercises Green Resistive clothespin used in right hand with a straight finger fist to pick up 25 sponges and place in container.     Other Hand Exercises With palm on tabletop; passive stretching of PIP joint completed by therapist followed by active PIP extension attempting to decrease the degree of flexion.       Splinting   Splinting 5th digit gutter splint fabricated to provide PIP extension at night. Reviewed precautions, wearing schedule, and donning/doffing technique. 2 straps placed to secure                  OT Education - 09/09/20 1455    Education Details Discussed use of finger extension splint at night. Verbal education provided on wearing schedule (nighttime), precautions, care management, and donning/doffing technique. Verbal instruction to continue working on PIP extension with hand/palm flat on table.    Person(s) Educated Patient    Methods Explanation    Comprehension Verbalized understanding            OT Short Term Goals - 09/02/20 1720      OT SHORT TERM GOAL #1   Title Patient will be educated and independent with HEP in order to increase functional use of her right hand and return to using it as her dominant extremity for all daily tasks.    Time 4    Period Weeks    Status On-going    Target Date 09/24/20      OT SHORT TERM GOAL #2   Title Patient will decrease extensor lag in right small DIP joint to 15 degrees or less in while demonstrating increased extensor strength.    Time 4    Period Weeks    Status On-going      OT SHORT TERM GOAL #3   Title Patient will increase right hand grip strength by 5# in order to increase ability to grasp lightweight items and open jars/containers with less difficulty.    Time 4    Period Weeks    Status On-going      OT SHORT TERM GOAL #4   Title Patient will  report a decrease in pain while completing gripping tasks using her right hand which will allow her to increase her use to 100%.    Time 4    Period Weeks    Status On-going  Plan - 09/09/20 1500    Clinical Impression Statement A: Completed grip strengthening task while using resistive clothespins and handgripper. Min difficulty to complete with increased time. Fabricated gutter splint for 5th digit to wear at night to assist with PIP joint extension. VC for form and technique were provided as needed.    Body Structure / Function / Physical Skills ADL;Strength;Pain;UE functional use;ROM;Fascial restriction    Plan P: Follow up on splint use. Reassessment and discharge. Update HEP as needed.    OT Home Exercise Plan eval: yellow putty, finger taps11/30: tendon glides, DIP extension at table.    Consulted and Agree with Plan of Care Patient           Patient will benefit from skilled therapeutic intervention in order to improve the following deficits and impairments:   Body Structure / Function / Physical Skills: ADL, Strength, Pain, UE functional use, ROM, Fascial restriction       Visit Diagnosis: Pain in joint of right hand  Other symptoms and signs involving the musculoskeletal system    Problem List Patient Active Problem List   Diagnosis Date Noted  . Muscular deconditioning 01/07/2020  . Dysphagia 03/30/2019  . Constipation 03/30/2019  . Pacemaker   . Chronic respiratory failure with hypoxia ([place])   . On home O2   . Sick sinus syndrome ([place]) 01/22/2019  . CKD (chronic kidney disease) stage 3, GFR 30-59 ml/min (H[facility]) 01/21/2019  . Atrial fibrillation with RVR ([place]) 01/20/2019  . Acute on chronic systolic CHF (congestive heart failure) ([place]) 12/25/2018  . Tachy-brady syndrome ([place]) 12/25/2018  . Atrial fibrillation, chronic ([place]) 12/24/2018  . Congestive heart failure with left ventricular diastolic dysfunction, unspecified failure  chronicity ([place]) 12/24/2018  . Malnutrition of moderate degree 11/20/2018  . Atrial fibrillation with rapid ventricular response ([place]) 11/18/2018  . GERD (gastroesophageal reflux disease) 11/18/2018  . CAP (community acquired pneumonia) 11/18/2018  . Acute on chronic diastolic HF (heart failure) ([place]) 11/18/2018  . Stroke (cerebrum) ([place]) 05/19/2018  . Hip fracture ([place]) 12/31/2017  . PAF (paroxysmal atrial fibrillation) ([place])   . NSVT (nonsustained ventricular tachycardia) ([place])   . Mitral regurgitation   . Frequent PVCs   . Bradycardia   . Essential hypertension   . Hypokalemia   . Diverticulitis 08/09/2016  . Diverticulosis of colon 04/22/2016  . Diverticulosis of large intestine without perforation or abscess without bleeding 04/22/2016  . Generalized anxiety disorder 04/22/2016  . Irritable bowel syndrome without diarrhea 04/22/2016  . Mitral valve prolapse 04/22/2016  . Mixed hyperlipidemia 04/22/2016  . Osteopenia 04/22/2016  . Low blood pressure 09/24/2014  . Hypercholesterolemia   . History of depression   . Scoliosis   . Osteoporosis   . DOE (dyspnea on exertion) 05/31/2008   Ailene Ravel, OTR/L,CBIS  [phone]  09/09/2020, 3:05 PM  [facility] [address] [place], [place], [address] Phone: [phone]   Fax:  [phone]  Name: JAMIELYNN WIGLEY MRN: [id number] Date of Birth: 01/23/1933

## 2020-09-16 ENCOUNTER — Encounter (HOSPITAL_COMMUNITY): Payer: Self-pay

## 2020-09-16 ENCOUNTER — Ambulatory Visit (HOSPITAL_COMMUNITY): Payer: Medicare Other | Attending: Orthopaedic Surgery

## 2020-09-16 ENCOUNTER — Other Ambulatory Visit: Payer: Self-pay

## 2020-09-16 DIAGNOSIS — M25541 Pain in joints of right hand: Secondary | ICD-10-CM | POA: Diagnosis present

## 2020-09-16 DIAGNOSIS — R29898 Other symptoms and signs involving the musculoskeletal system: Secondary | ICD-10-CM | POA: Insufficient documentation

## 2020-09-16 NOTE — Patient Instructions (Signed)
PUTTY  EXTENSION  LOOP  Create a small tubular section of putty. Form a loop around your fingers and then pull it apart as shown. Complete 10 times. 1 time a day.     FINGER EXTENSION (small finger)  Begin with your hand on a table with palm side down. Raise your finger up and down in a controlled movement as shown. 10 times. 1-2 times a day.    Blocked PIP extension - 10 times. 1 time a day.  Block just below the middle finger joint  Straighten finger  Relax  Do not resist against finger or squeeze too hard

## 2020-09-16 NOTE — Therapy (Signed)
[place], [place], [address] Phone: [phone]   Fax:  [phone]  Occupational Therapy Treatment Reassess and discharge Patient Details  Name: Carol Hardin MRN: [id number] Date of Birth: 04-Apr-1933 Referring Provider (OT): Harlin Heys, MD   Encounter Date: 09/16/2020   OT End of Session - 09/16/20 6384    Visit Number 4    Number of Visits 4    Authorization Type [facility] Medicare    Authorization Time Period No visit limit. No auth needed    Progress Note Due on Visit 10    OT Start Time 1435   reassess and discharge   OT Stop Time 1510    OT Time Calculation (min) 35 min    Activity Tolerance Patient tolerated treatment well    Behavior During Therapy WFL for tasks assessed/performed           Past Medical History:  Diagnosis Date  . Anxiety   . Arthritis    "fingers, right toe" (08/09/2016)  . Basal cell carcinoma of lower leg, right   . Bradycardia    a. Holter 08/30/16 showed profound bradycardia down to 30 during awake hours, several 2 second pauses, very frequent PVCs >8000 in 48 hours, and NSVT (longest of 14 beats).  . Cardiomyopathy ([place])    a. EF 40% by echo 08/2016.  . Daily headache    "I usually wake up w/a headache; sometimes it's a migraine" (08/09/2016)  . Depression   . Diverticulosis    Hx. of  . Frequent PVCs   . GERD (gastroesophageal reflux disease)   . Hypercholesterolemia   . Migraine   . Mitral regurgitation   . MVP (mitral valve prolapse)   . NSVT (nonsustained ventricular tachycardia) ([place])    a. first noted event monitor 08/2016.  . Osteoporosis   . PAF (paroxysmal atrial fibrillation) ([place])    a. in afib at time of echo 08/2016.  Marland Kitchen Pneumonia   . Scoliosis    mild  . Squamous cell carcinoma of neck     Past Surgical History:  Procedure Laterality Date  . AV NODE ABLATION N/A 01/23/2019   Procedure: AV NODE ABLATION;  Surgeon: Thompson Grayer, MD;   Location: [facility];  Service: Cardiovascular;  Laterality: N/A;  . BASAL CELL CARCINOMA EXCISION Right    RLE  . BIV PACEMAKER INSERTION CRT-P N/A 01/23/2019   Procedure: BIV PACEMAKER INSERTION CRT-P;  Surgeon: Thompson Grayer, MD;  Location: [facility];  Service: Cardiovascular;  Laterality: N/A;  . BLEPHAROPLASTY    . BREAST BIOPSY Left   . BREAST CYST ASPIRATION Left   . BREAST CYST EXCISION Left   . BUNIONECTOMY Bilateral   . DILATION AND CURETTAGE OF UTERUS    . EXCISIONAL HEMORRHOIDECTOMY    . INTRAMEDULLARY (IM) NAIL INTERTROCHANTERIC Left 12/31/2017   Procedure: INTRAMEDULLARY (IM) NAIL INTERTROCHANTRIC;  Surgeon: Nicholes Stairs, MD;  Location: [facility];  Service: Orthopedics;  Laterality: Left;  . KNEE ARTHROSCOPY Right 04/12/2007  . KNEE ARTHROSCOPY Left   . SQUAMOUS CELL CARCINOMA EXCISION     "neck"  . TONSILLECTOMY      There were no vitals filed for this visit.   Subjective Assessment - 09/16/20 1553    Subjective  S: I've have been doing all the exercises that you've recommended.    Currently in Pain? No/denies              [facility] OT Assessment -  09/16/20 1445      Assessment   Medical Diagnosis S/P right small finger proximal phalanx fracture      Precautions   Precautions Other (comment)    Precaution Comments Follow [facility] Protocol. 7-8 weeks (11/5-11/12): Remove splint for 6-8 hours during day. Wear at night. No lifting over 10lbs. Light strengthening. Week 10-12 (11/26-12/10): Progress as tolerated. No tight sustained gripping activities against resistance. No heavy manual labor.       ROM / Strength   AROM / PROM / Strength Strength;AROM      AROM   Overall AROM Comments 18 degree extensor lag small finger PIP joint. Able to achieve full P/ROM PIP joint extension   previous: 28 degree extensor lag     Strength   Right Hand Grip (lbs) 50   previous: 40     Right Hand AROM   R Little  MCP 0-90 84 Degrees   previous:  80   R Little PIP 0-100 90 Degrees   previous: same   R Little DIP 0-70 62 Degrees   previous: 40                           OT Education - 09/16/20 1554    Education Details reviewed progress in therapy. reviewed therapy goals. reviewed HEP and provided additional finger extensor strengthening activities. Pt with questions regarding splint strapping. All education completed this date.    Person(s) Educated Patient    Methods Explanation;Handout;Demonstration    Comprehension Verbalized understanding;Returned demonstration            OT Short Term Goals - 09/16/20 1453      OT SHORT TERM GOAL #1   Title Patient will be educated and independent with HEP in order to increase functional use of her right hand and return to using it as her dominant extremity for all daily tasks.    Time 4    Period Weeks    Status Achieved    Target Date 09/24/20      OT SHORT TERM GOAL #2   Title Patient will decrease extensor lag in right small DIP joint to 15 degrees or less in while demonstrating increased extensor strength.    Time 4    Period Weeks    Status Partially Met      OT SHORT TERM GOAL #3   Title Patient will increase right hand grip strength by 5# in order to increase ability to grasp lightweight items and open jars/containers with less difficulty.    Time 4    Period Weeks    Status Achieved      OT SHORT TERM GOAL #4   Title Patient will report a decrease in pain while completing gripping tasks using her right hand which will allow her to increase her use to 100%.    Time 4    Period Weeks    Status Achieved                    Plan - 09/16/20 1555    Clinical Impression Statement A: Reassessment completed this date. patient has met 2/4 therapy goals with the 4th goal partially met related to extension ROM. Pt has shown improvement with hand strength, level of pain during functional tasks, and increasing ROM during finger extension. She continues  to demonstrate 18 degrees of extensor lag in the right PIP joint 5th digit although improvement is demonstrated from evaluation. Night  splint fabricated to help increase finger extension when sleeping. HEP has been established and updated this date. All education has been completed. Patient will be discharged from OT services to continue working on ROM independently.    Body Structure / Function / Physical Skills ADL;Strength;Pain;UE functional use;ROM;Fascial restriction    Plan P: discharge from OT services with HEP. Follow up with MD as needed.    OT Home Exercise Plan eval: yellow putty, finger taps11/30: tendon glides, DIP extension at table.12/7: Finger extension strengthening    Consulted and Agree with Plan of Care Patient           Patient will benefit from skilled therapeutic intervention in order to improve the following deficits and impairments:   Body Structure / Function / Physical Skills: ADL, Strength, Pain, UE functional use, ROM, Fascial restriction       Visit Diagnosis: Other symptoms and signs involving the musculoskeletal system  Pain in joint of right hand    Problem List Patient Active Problem List   Diagnosis Date Noted  . Muscular deconditioning 01/07/2020  . Dysphagia 03/30/2019  . Constipation 03/30/2019  . Pacemaker   . Chronic respiratory failure with hypoxia ([place])   . On home O2   . Sick sinus syndrome ([place]) 01/22/2019  . CKD (chronic kidney disease) stage 3, GFR 30-59 ml/min ([facility]) 01/21/2019  . Atrial fibrillation with RVR ([place]) 01/20/2019  . Acute on chronic systolic CHF (congestive heart failure) ([place]) 12/25/2018  . Tachy-brady syndrome ([place]) 12/25/2018  . Atrial fibrillation, chronic ([place]) 12/24/2018  . Congestive heart failure with left ventricular diastolic dysfunction, unspecified failure chronicity ([place]) 12/24/2018  . Malnutrition of moderate degree 11/20/2018  . Atrial fibrillation with rapid ventricular response ([place]) 11/18/2018  .  GERD (gastroesophageal reflux disease) 11/18/2018  . CAP (community acquired pneumonia) 11/18/2018  . Acute on chronic diastolic HF (heart failure) ([place]) 11/18/2018  . Stroke (cerebrum) ([place]) 05/19/2018  . Hip fracture ([place]) 12/31/2017  . PAF (paroxysmal atrial fibrillation) ([place])   . NSVT (nonsustained ventricular tachycardia) ([place])   . Mitral regurgitation   . Frequent PVCs   . Bradycardia   . Essential hypertension   . Hypokalemia   . Diverticulitis 08/09/2016  . Diverticulosis of colon 04/22/2016  . Diverticulosis of large intestine without perforation or abscess without bleeding 04/22/2016  . Generalized anxiety disorder 04/22/2016  . Irritable bowel syndrome without diarrhea 04/22/2016  . Mitral valve prolapse 04/22/2016  . Mixed hyperlipidemia 04/22/2016  . Osteopenia 04/22/2016  . Low blood pressure 09/24/2014  . Hypercholesterolemia   . History of depression   . Scoliosis   . Osteoporosis   . DOE (dyspnea on exertion) 05/31/2008    OCCUPATIONAL THERAPY DISCHARGE SUMMARY  Visits from Start of Care: 4  Current functional level related to goals / functional outcomes: See above   Remaining deficits: See above   Education / Equipment: See above Plan: Patient agrees to discharge.  Patient goals were met. Patient is being discharged due to meeting the stated rehab goals.  ?????         Ailene Ravel, OTR/L,CBIS  [phone]  09/16/2020, 3:59 PM  [facility] [address] [place], [place], [address] Phone: [phone]   Fax:  [phone]  Name: Carol Hardin MRN: [id number] Date of Birth: [birth year]

## 2020-09-23 ENCOUNTER — Ambulatory Visit: Payer: Medicare Other | Admitting: Neurology

## 2020-09-24 ENCOUNTER — Ambulatory Visit: Payer: Medicare Other | Admitting: Neurology

## 2020-10-27 ENCOUNTER — Telehealth: Payer: Medicare Other | Admitting: Cardiovascular Disease

## 2020-11-04 IMAGING — RF ESOPHAGUS/BARIUM SWALLOW/TABLET STUDY
9 series · 12 of 24 positions shown · non-contrast
Comparison: None

CLINICAL DATA: Dysphagia

EXAM:
ESOPHOGRAM / BARIUM SWALLOW / BARIUM TABLET STUDY
TECHNIQUE: Combined double contrast and single contrast examination performed
using effervescent crystals, thick barium liquid, and thin barium
liquid. The patient was observed with fluoroscopy swallowing a 13 mm
barium sulphate tablet.
FLUOROSCOPY TIME:  Fluoroscopy Time:  1 minutes 42 seconds
Radiation Exposure Index (if provided by the fluoroscopic device):
33.0 mGy
Number of Acquired Spot Images: multiple fluoroscopic screen
captures

[Series 1: cp_standard · 0.17mm/px · 1 of 126 frames shown (1 of 9)]
[frame 64/126]
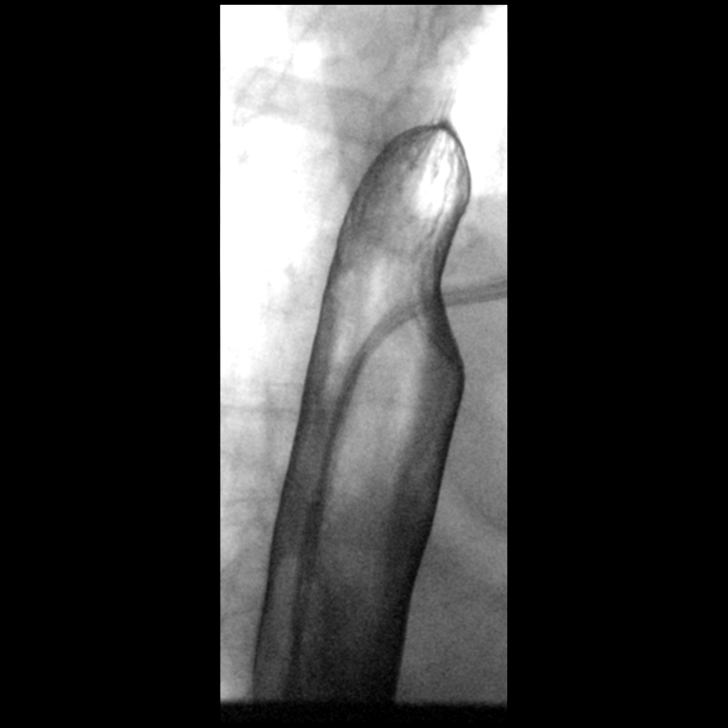

[Series 2: cp_standard · 0.17mm/px · 1 of 151 frames shown (2 of 9)]
[frame 62/151]
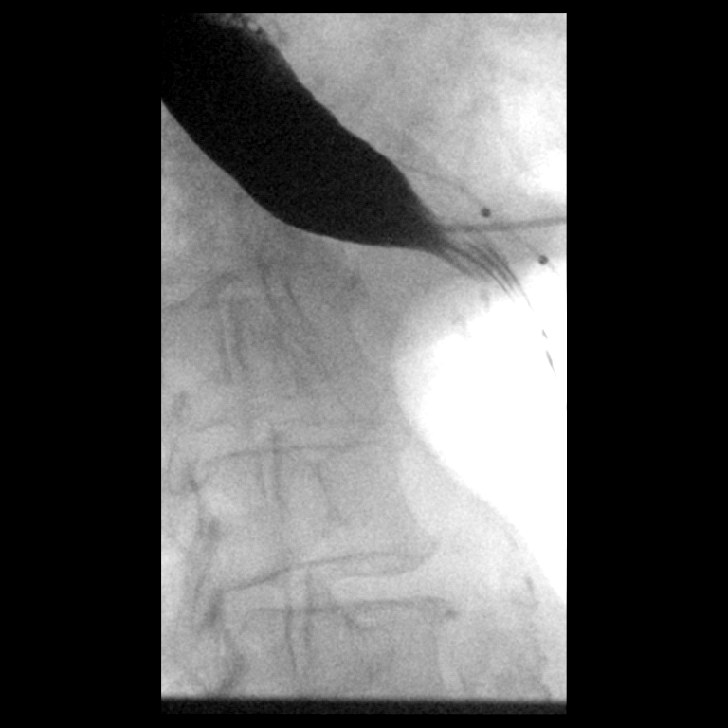

[Series 3: cp_standard · 0.17mm/px · 2 of 116 frames shown (3 of 9)]
[frame 18/116]
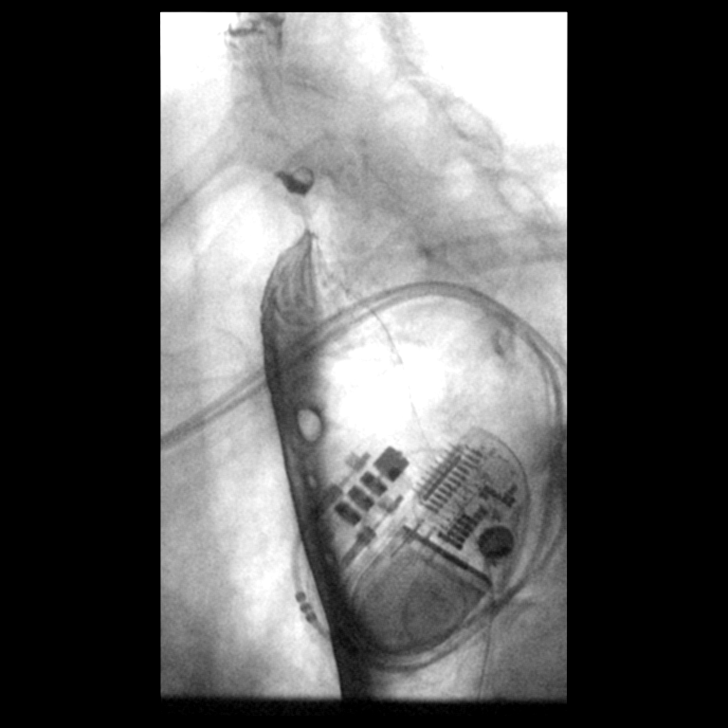
[frame 99/116]
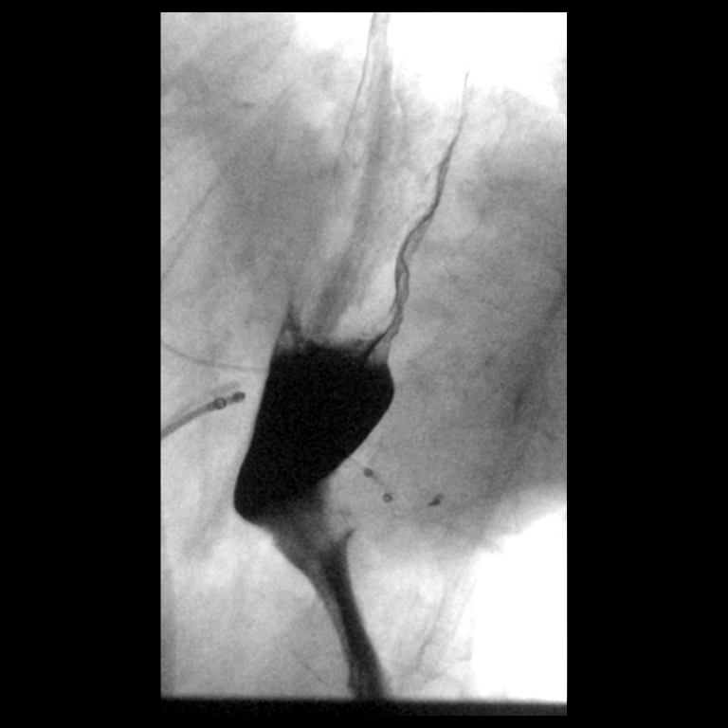

[Series 4: cp_standard · 0.26mm/px · 1 of 279 frames shown (4 of 9)]
[frame 140/279]
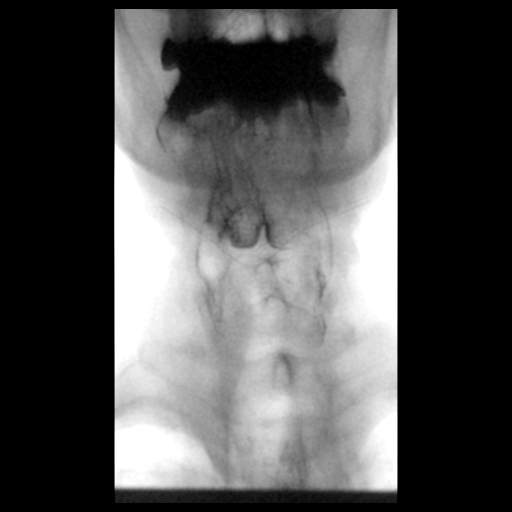

[Series 5: cp_standard · 0.26mm/px · 1 of 122 frames shown (5 of 9)]
[frame 62/122]
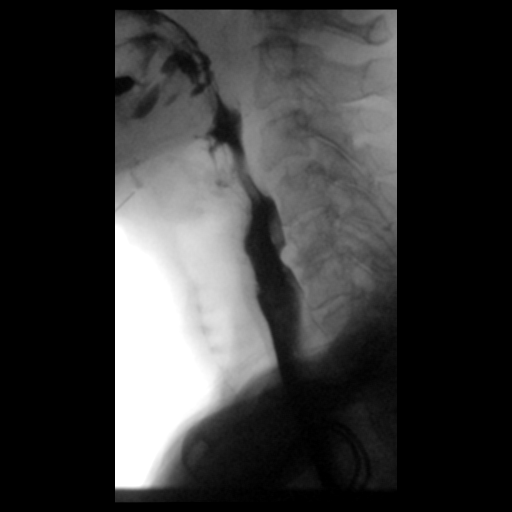

[Series 6: cp_standard · 0.28mm/px · 2 of 51 frames shown (6 of 9)]
[frame 8/51]
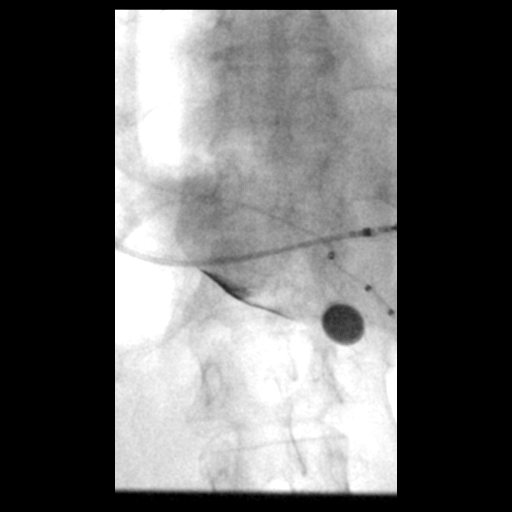
[frame 51/51]
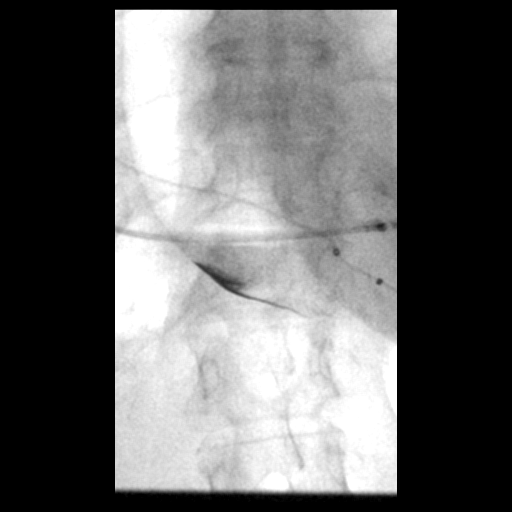

[Series 7: cp_standard · 0.19mm/px · 1 of 137 frames shown (7 of 9)]
[frame 69/137]
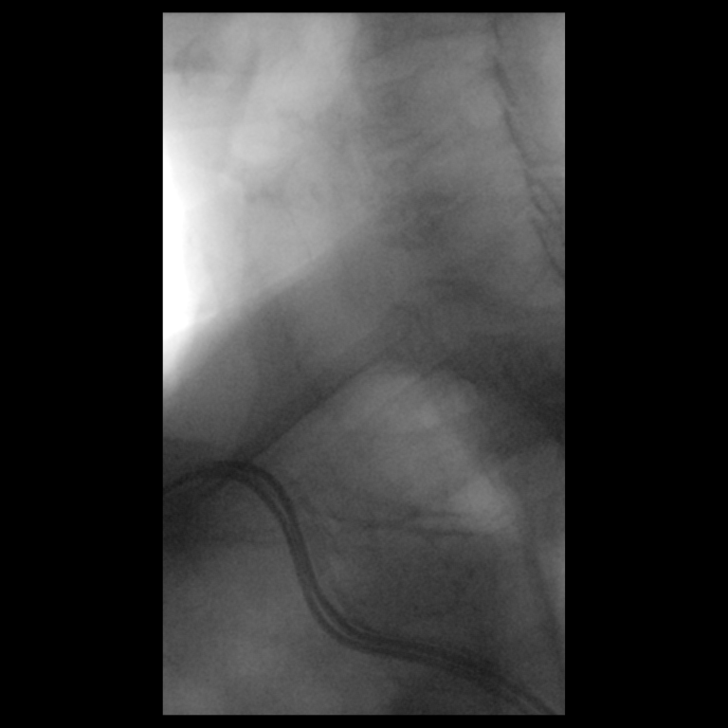

[Series 8: cp_standard · 0.18mm/px · 1 of 40 frames shown (8 of 9)]
[frame 21/40]
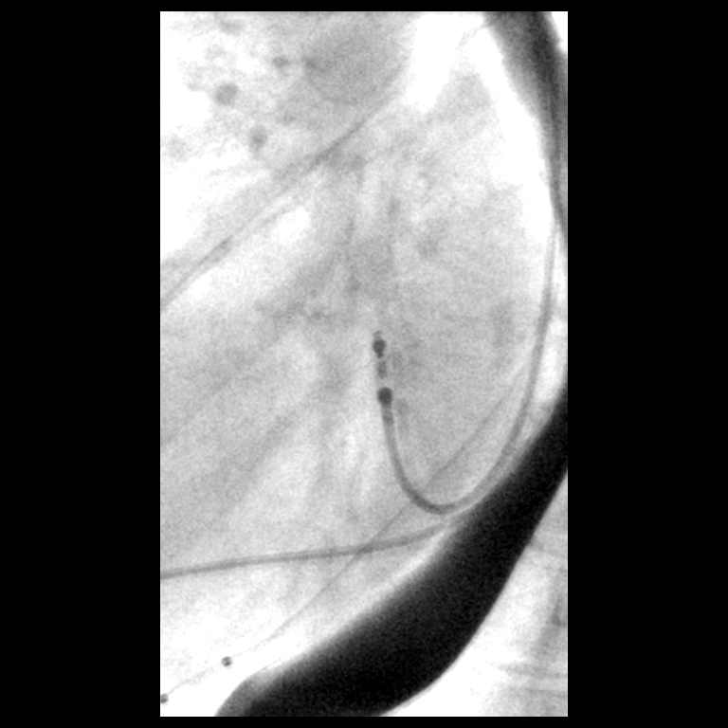

[Series 9: cp_standard · 0.18mm/px · 2 of 51 frames shown (9 of 9)]
[frame 8/51]
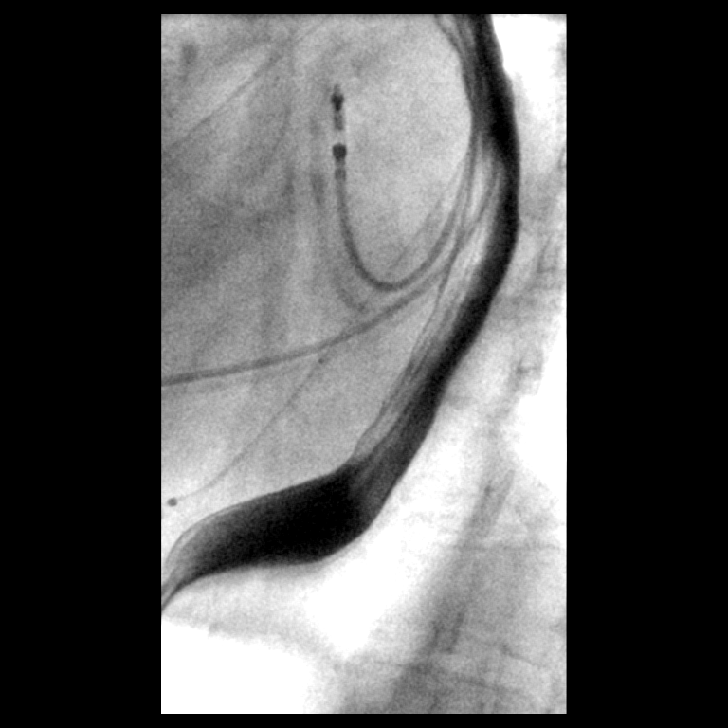
[frame 47/51]
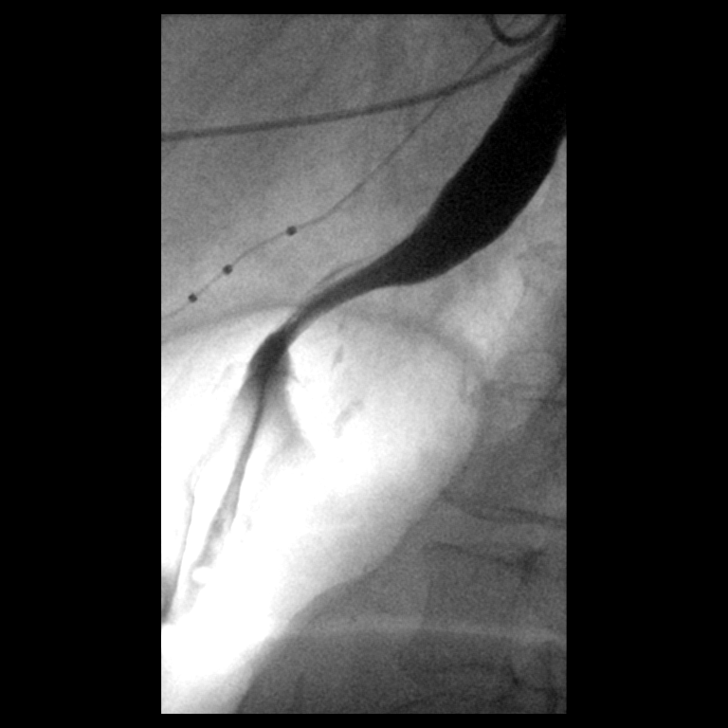

[12 of 24 positions shown; findings below may reference images not displayed]

FINDINGS: Esophageal distention: Normal distention without mass or stricture

Filling defects:  None

12.5 mm barium tablet: Passed from oral cavity to stomach without
obstruction

Motility:  Age-related esophageal dysmotility, mild

Mucosa:  Smooth without irregularity or ulceration

Hypopharynx/cervical esophagus: No laryngeal penetration or
aspiration. Mildly prominent cricopharyngeus muscle. No Zenker
diverticulum.

Hiatal hernia:  Absent

GE reflux:  Not witnessed during exam

Other:  N/A
IMPRESSION: Age-appropriate esophageal dysmotility.

No significant esophageal abnormalities identified.

## 2020-11-06 NOTE — Progress Notes (Signed)
Cardiology Office Note:   Date:  11/07/2020  NAME:  Carol Hardin    MRN: [id number] DOB:  1933/05/28   PCP:  Celene Squibb, MD  Cardiologist:  Carlyle Dolly, MD  Electrophysiologist:  Thompson Grayer, MD   Referring MD: Celene Squibb, MD   Chief Complaint  Patient presents with  . Congestive Heart Failure   History of Present Illness:   Carol Hardin is a 87 y.o. female with a hx of systolic HF, permanent Afib s/p CRT-P, CVA who presents for follow-up.  She reports she is doing fairly well.  She did not start taking lisinopril.  She is concerned about her blood pressure being low.  She reports she is not that active.  She takes Lasix as needed.  She denies any chest pain or significant shortness of breath.  I did discuss adding digoxin in addition to lisinopril to make sure her blood pressures not get too low.  She is okay to do this.  She seems to be doing fairly well.  She reports she is just not interested in taking a lot of medications.  She is also expressed a desire for less aggressive measures.  She denies any major symptoms in office today.  Blood pressure 100/68.  Pulse 67.  Her husband is Carol Hardin who also is a patient of mine date of birth 02/07/1938  Echo 06/24/2010: Report reviewed; EF 55-60% with moderate MR and PMVL prolapse  Echo 08/26/2016: Afib with RVR, EF 40%, PMVL prolapse, myxomatous valve with moderate to severe MR Echo 05/20/2018: SB, EF 40%, PMVL prolapse, with moderate to severe MR Echo 11/19/2018: EF 35-40%, PMVL prolapse, with moderate to severe MR Echo 03/05/2020: EF 20%, Severely dilated LV, LVIDD 7.0 cm, LVOT VTI 12 cm, BIV Paced, Severe MR  Problem List 1.Mitral Valve prolapse (PMVL prolapse with moderate to severe MR in 2017) 2. Systolic HF, EF 68% -non-ischemic etiology? -LVIDD 7.0 cm 3 SSS/Afib/Tachy-brady s/p CRT-P 4. Permanent Afib s/p AVN ablation  -intolerant of rhythm control agents and AVN ablation pursued 5. CVA -05/2018  Past  Medical History: Past Medical History:  Diagnosis Date  . Anxiety   . Arthritis    "fingers, right toe" (08/09/2016)  . Basal cell carcinoma of lower leg, right   . Bradycardia    a. Holter 08/30/16 showed profound bradycardia down to 30 during awake hours, several 2 second pauses, very frequent PVCs >8000 in 48 hours, and NSVT (longest of 14 beats).  . Cardiomyopathy ([place])    a. EF 40% by echo 08/2016.  . Daily headache    "I usually wake up w/a headache; sometimes it's a migraine" (08/09/2016)  . Depression   . Diverticulosis    Hx. of  . Frequent PVCs   . GERD (gastroesophageal reflux disease)   . Hypercholesterolemia   . Migraine   . Mitral regurgitation   . MVP (mitral valve prolapse)   . NSVT (nonsustained ventricular tachycardia) ([place])    a. first noted event monitor 08/2016.  . Osteoporosis   . PAF (paroxysmal atrial fibrillation) ([place])    a. in afib at time of echo 08/2016.  Marland Kitchen Pneumonia   . Scoliosis    mild  . Squamous cell carcinoma of neck     Past Surgical History: Past Surgical History:  Procedure Laterality Date  . AV NODE ABLATION N/A 01/23/2019   Procedure: AV NODE ABLATION;  Surgeon: Thompson Grayer, MD;  Location: [facility];  Service: Cardiovascular;  Laterality:  N/A;  . BASAL CELL CARCINOMA EXCISION Right    RLE  . BIV PACEMAKER INSERTION CRT-P N/A 01/23/2019   Procedure: BIV PACEMAKER INSERTION CRT-P;  Surgeon: Thompson Grayer, MD;  Location: [facility];  Service: Cardiovascular;  Laterality: N/A;  . BLEPHAROPLASTY    . BREAST BIOPSY Left   . BREAST CYST ASPIRATION Left   . BREAST CYST EXCISION Left   . BUNIONECTOMY Bilateral   . DILATION AND CURETTAGE OF UTERUS    . EXCISIONAL HEMORRHOIDECTOMY    . INTRAMEDULLARY (IM) NAIL INTERTROCHANTERIC Left 12/31/2017   Procedure: INTRAMEDULLARY (IM) NAIL INTERTROCHANTRIC;  Surgeon: Nicholes Stairs, MD;  Location: [facility];  Service: Orthopedics;  Laterality: Left;  . KNEE ARTHROSCOPY Right  04/12/2007  . KNEE ARTHROSCOPY Left   . SQUAMOUS CELL CARCINOMA EXCISION     "neck"  . TONSILLECTOMY      Current Medications: Current Meds  Medication Sig  . Calcium Carbonate Antacid (TUMS PO) Take by mouth as needed.  . digoxin (LANOXIN) 0.125 MG tablet Take 1 tablet (0.125 mg total) by mouth daily.  Marland Kitchen docusate sodium (COLACE) 100 MG capsule Take 100 mg by mouth daily as needed for mild constipation.  Marland Kitchen ELIQUIS 2.5 MG TABS tablet TAKE 1 TABLET(2.5 MG) BY MOUTH TWICE DAILY  . Ensure (ENSURE) Take 237 mLs by mouth daily.   . furosemide (LASIX) 20 MG tablet Take 20 mg by mouth as needed for fluid.  Marland Kitchen lisinopril (ZESTRIL) 2.5 MG tablet Take 1 tablet (2.5 mg total) by mouth daily.  Marland Kitchen LORazepam (ATIVAN) 1 MG tablet Take 1 mg by mouth at bedtime.  [name] Glycol-Propyl Glycol (SYSTANE OP) Apply to eye as needed.  . Probiotic Product (ALIGN PO) Take by mouth daily.  . Psyllium (METAMUCIL FIBER PO) Take by mouth daily. 1 1/2 teaspoons daily     Allergies:    Omeprazole, Flagyl [metronidazole], Amiodarone, Aripiprazole, Hylan g-f 20, Paroxetine hcl, Statins, Sulfa antibiotics, Toprol xl [metoprolol succinate], Vancomycin, and Penicillins   Social History: Social History   Socioeconomic History  . Marital status: Married    Spouse name: Not on file  . Number of children: Not on file  . Years of education: Not on file  . Highest education level: Not on file  Occupational History  . Not on file  Tobacco Use  . Smoking status: Former Smoker    Packs/day: 1.00    Years: 20.00    Pack years: 20.00    Types: Cigarettes    Quit date: 1989    Years since quitting: 33.0  . Smokeless tobacco: Never Used  Vaping Use  . Vaping Use: Never used  Substance and Sexual Activity  . Alcohol use: No  . Drug use: No  . Sexual activity: Not Currently  Other Topics Concern  . Not on file  Social History Narrative  . Not on file   Social Determinants of Health   Financial Resource  Strain: Not on file  Food Insecurity: Not on file  Transportation Needs: Not on file  Physical Activity: Not on file  Stress: Not on file  Social Connections: Not on file     Family History: The patient's family history includes Heart failure in her father and mother; Leukemia in her mother. There is no history of Allergic rhinitis, Angioedema, Asthma, Eczema, Atopy, Immunodeficiency, or Urticaria.  ROS:   All other ROS reviewed and negative. Pertinent positives noted in the HPI.     EKGs/Labs/Other Studies Reviewed:   The  following studies were personally reviewed by me today:  TTE 03/05/2020 1. Left ventricular ejection fraction, by estimation, is 20%. The left  ventricle has severely decreased function. The left ventricle demonstrates  regional wall motion abnormalities (see scoring diagram/findings for  description). The left ventricular  internal cavity size was moderately dilated. Left ventricular diastolic  parameters are indeterminate. Elevated left atrial pressure.  2. Right ventricular systolic function is moderately reduced. The right  ventricular size is moderately enlarged. There is mildly elevated  pulmonary artery systolic pressure.  3. Left atrial size was severely dilated.  4. Right atrial size was moderately dilated.  5. The mitral valve is degenerative. Moderate to severe mitral valve  regurgitation.  6. Tricuspid valve regurgitation is moderate.  7. The aortic valve is tricuspid. Aortic valve regurgitation is mild. No  aortic stenosis is present.  8. The inferior vena cava is dilated in size with <50% respiratory  variability, suggesting right atrial pressure of 15 mmHg.   Recent Labs: 01/07/2020: Pro B Natriuretic peptide (BNP) 569.0 04/23/2020: ALT 21; BNP 444.6; Hemoglobin 14.0; Platelets 198; Potassium 4.9; Sodium 140 08/11/2020: BUN 17; Creatinine 0.9; TSH 3.66   Recent Lipid Panel    Component Value Date/Time   CHOL 134 08/11/2020 0000    TRIG 67 08/11/2020 0000   HDL 41 08/11/2020 0000   CHOLHDL 3.3 01/23/2019 0631   VLDL 13 01/23/2019 0631   LDLCALC 79 08/11/2020 0000    Physical Exam:   VS:  BP 100/68   Pulse 67   Ht 5\' 4"  (1.626 m)   Wt 123 lb 12.8 oz (56.2 kg)   SpO2 95%   BMI 21.25 kg/m    Wt Readings from Last 3 Encounters:  11/07/20 123 lb 12.8 oz (56.2 kg)  08/25/20 122 lb (55.3 kg)  08/12/20 123 lb 12.8 oz (56.2 kg)    General: Well nourished, well developed, in no acute distress Head: Atraumatic, normal size  Eyes: PEERLA, EOMI  Neck: Supple, no JVD Endocrine: No thryomegaly Cardiac: Normal S1, S2; RRR; 2 out of 6 holosystolic murmur Lungs: Clear to auscultation bilaterally, no wheezing, rhonchi or rales  Abd: Soft, nontender, no hepatomegaly  Ext: Trace edema Musculoskeletal: No deformities, BUE and BLE strength normal and equal Skin: Warm and dry, no rashes   Neuro: Alert and oriented to person, place, time, and situation, CNII-XII grossly intact, no focal deficits  Psych: Normal mood and affect   ASSESSMENT:   KAYDYNCE PAT is a 87 y.o. female who presents for the following: 1. Nonischemic cardiomyopathy ([place])   2. Chronic systolic heart failure (HCC)   3. Permanent atrial fibrillation ([place])   4. Nonrheumatic mitral valve regurgitation     PLAN:   1. Nonischemic cardiomyopathy ([place]) 2. Chronic systolic heart failure (H[facility]) -EF 10-15%.  EF is precipitously dropped.  There is been mention of severe mitral vegetation but echo is inconsistent with this.  She has atrial fibrillation and is status post AV nodal ablation.  I will think A. fib is contributing to this.  Really unclear what has caused this drop in her ejection fraction.  She also does not like taking medications.  This is problematic.  She is euvolemic on examination today. -Continue Coreg 3.125 mg twice daily. -We will add digoxin 0.125 mg daily.  I have asked her to try 2.5 mg of lisinopril daily.  She will try. -She is on  Lasix 20 mg daily as needed -Overall this is difficult as she is not wanting  aggressive medical care.  She has never had an ischemic evaluation for what I can tell.  I but I do not feel she has ischemia to explain her precipitous drop in EF.  -She is status post AV node ablation with CRT-P.  I do not feel that her arrhythmia or permanent A. fib explains this. -It could be related to her MR however the findings of her MRI have been inconsistent over the years.  She has no evidence of prolapse or severe MR on examination.  May need to repeat an echocardiogram in 3 to 6 months to determine how she does.  If her EF is no better and MR is still there we may consider transesophageal echo.  She is reluctant to pursue any procedures or invasive evaluations.  3. Permanent atrial fibrillation (HCC) -Status post AV node ablation with CRT-P.  Continue Eliquis 2.5 mg daily.  4. Nonrheumatic mitral valve regurgitation -She really has inconsistent readings on her MRI.  She does not have severe MR examination.  Her most recent echocardiogram is also been consistent with severe MR.  I do not feel like this is the etiology of her heart failure.  We will plan medical therapy for 3 to 6 months and then reevaluate if needed.  Again the patient has expressed a desire for less invasive measures.  Disposition: Return in about 3 months (around 02/05/2021).  Medication Adjustments/Labs and Tests Ordered: Current medicines are reviewed at length with the patient today.  Concerns regarding medicines are outlined above.  No orders of the defined types were placed in this encounter.  Meds ordered this encounter  Medications  . digoxin (LANOXIN) 0.125 MG tablet    Sig: Take 1 tablet (0.125 mg total) by mouth daily.    Dispense:  90 tablet    Refill:  3    Patient Instructions  Medication Instructions:  Start Digoxin 0.125 daily   *If you need a refill on your cardiac medications before your next appointment, please  call your pharmacy*    Follow-Up: At [facility], you and your health needs are our priority.  As part of our continuing mission to provide you with exceptional heart care, we have created designated Provider Care Teams.  These Care Teams include your primary Cardiologist (physician) and Advanced Practice Providers (APPs -  Physician Assistants and Nurse Practitioners) who all work together to provide you with the care you need, when you need it.  We recommend signing up for the patient portal called "MyChart".  Sign up information is provided on this After Visit Summary.  MyChart is used to connect with patients for Virtual Visits (Telemedicine).  Patients are able to view lab/test results, encounter notes, upcoming appointments, etc.  Non-urgent messages can be sent to your provider as well.   To learn more about what you can do with MyChart, go to [email or web address].    Your next appointment:   3 month(s)  The format for your next appointment:   In Person  Provider:   Eleonore Chiquito, MD       Time Spent with Patient: I have spent a total of 25 minutes with patient reviewing hospital notes, telemetry, EKGs, labs and examining the patient as well as establishing an assessment and plan that was discussed with the patient.  > 50% of time was spent in direct patient care.  Signed, Addison Naegeli. Audie Box, [place]  [address] [place], [place] [address] [phone]  11/07/2020 4:19 PM

## 2020-11-07 ENCOUNTER — Ambulatory Visit (INDEPENDENT_AMBULATORY_CARE_PROVIDER_SITE_OTHER): Payer: Medicare Other | Admitting: Cardiovascular Disease

## 2020-11-07 ENCOUNTER — Encounter: Payer: Self-pay | Admitting: Cardiovascular Disease

## 2020-11-07 ENCOUNTER — Other Ambulatory Visit: Payer: Self-pay

## 2020-11-07 VITALS — BP 100/68 | HR 67 | Ht 64.0 in | Wt 123.8 lb

## 2020-11-07 DIAGNOSIS — I5022 Chronic systolic (congestive) heart failure: Secondary | ICD-10-CM | POA: Diagnosis not present

## 2020-11-07 DIAGNOSIS — I428 Other cardiomyopathies: Secondary | ICD-10-CM | POA: Diagnosis not present

## 2020-11-07 DIAGNOSIS — I4821 Permanent atrial fibrillation: Secondary | ICD-10-CM | POA: Diagnosis not present

## 2020-11-07 DIAGNOSIS — I340 Nonrheumatic mitral (valve) insufficiency: Secondary | ICD-10-CM | POA: Diagnosis not present

## 2020-11-07 MED ORDER — DIGOXIN 125 MCG PO TABS: 0.1250 mg | ORAL_TABLET | Freq: Every day | ORAL | 3 refills | Status: DC

## 2020-11-07 NOTE — Patient Instructions (Signed)
Medication Instructions:  Start Digoxin 0.125 daily   *If you need a refill on your cardiac medications before your next appointment, please call your pharmacy*    Follow-Up: At [facility] you and your health needs are our priority.  As part of our continuing mission to provide you with exceptional heart care, we have created designated Provider Care Teams.  These Care Teams include your primary Cardiologist (physician) and Advanced Practice Providers (APPs -  Physician Assistants and Nurse Practitioners) who all work together to provide you with the care you need, when you need it.  We recommend signing up for the patient portal called "MyChart".  Sign up information is provided on this After Visit Summary.  MyChart is used to connect with patients for Virtual Visits (Telemedicine).  Patients are able to view lab/test results, encounter notes, upcoming appointments, etc.  Non-urgent messages can be sent to your provider as well.   To learn more about what you can do with MyChart, go to [email or web address].    Your next appointment:   3 month(s)  The format for your next appointment:   In Person  Provider:   Eleonore Chiquito, MD

## 2020-11-10 ENCOUNTER — Ambulatory Visit (INDEPENDENT_AMBULATORY_CARE_PROVIDER_SITE_OTHER): Payer: Medicare Other

## 2020-11-10 DIAGNOSIS — I495 Sick sinus syndrome: Secondary | ICD-10-CM | POA: Diagnosis not present

## 2020-11-10 LAB — CUP PACEART REMOTE DEVICE CHECK
Battery Remaining Longevity: 74 mo
Battery Remaining Percentage: 95.5 %
Battery Voltage: 2.99 V
Brady Statistic AP VP Percent: 0 %
Brady Statistic AP VS Percent: 0 %
Brady Statistic AS VP Percent: 0 %
Brady Statistic AS VS Percent: 0 %
Brady Statistic RA Percent Paced: 1 %
Date Time Interrogation Session: 20220131032414
Implantable Lead Implant Date: 20200414
Implantable Lead Implant Date: 20200414
Implantable Lead Implant Date: 20200414
Implantable Lead Location: 753858
Implantable Lead Location: 753859
Implantable Lead Location: 753860
Implantable Pulse Generator Implant Date: 20200414
Lead Channel Impedance Value: 460 Ohm
Lead Channel Impedance Value: 460 Ohm
Lead Channel Impedance Value: 760 Ohm
Lead Channel Pacing Threshold Amplitude: 0.5 V
Lead Channel Pacing Threshold Amplitude: 1 V
Lead Channel Pacing Threshold Pulse Width: 0.5 ms
Lead Channel Pacing Threshold Pulse Width: 0.7 ms
Lead Channel Sensing Intrinsic Amplitude: 1.9 mV
Lead Channel Sensing Intrinsic Amplitude: 12 mV
Lead Channel Setting Pacing Amplitude: 2.5 V
Lead Channel Setting Pacing Amplitude: 2.5 V
Lead Channel Setting Pacing Amplitude: 3.5 V
Lead Channel Setting Pacing Pulse Width: 0.5 ms
Lead Channel Setting Pacing Pulse Width: 0.7 ms
Lead Channel Setting Sensing Sensitivity: 3 mV
Pulse Gen Model: 3562
Pulse Gen Serial Number: 9431558

## 2020-11-13 ENCOUNTER — Telehealth: Payer: Self-pay | Admitting: Cardiovascular Disease

## 2020-11-13 NOTE — Telephone Encounter (Signed)
Called patient, advised that it was likely not the digoxin and to continue to take it. Also advised patient to get tested since she was having other issues, and patient just states she is not feeling good.  She was given information about staying hydrated- patient was thankful for call back.

## 2020-11-13 NOTE — Telephone Encounter (Signed)
Pt c/o medication issue:  1. Name of Medication: digoxin (LANOXIN) 0.125 MG tablet lisinopril (ZESTRIL) 2.5 MG tablet 2. How are you currently taking this medication (dosage and times per day)? As prescribed. Has not taken Digoxin today   3. Are you having a reaction (difficulty breathing--STAT)? Yes   4. What is your medication issue? Carol Hardin is calling stating she believes one of these medications may be causing her to have a reaction. She states she has been taking both medications since they were prescribed, but she woke up yesterday with a headache that would not go away (even with medication), a runny nose, cold (unable to get warm), and fatigue. She states she did not take the Digoxin today to see if it may be that, but she's beginning to get the headache again. Carol Hardin states her husband is trying to arrange her being Covid tested to confirm it is not that. Please advise.

## 2020-11-13 NOTE — Telephone Encounter (Signed)
I agree. This is not a side effect of digoxin.   [name]. Audie Box, MD, [place]  [address] [place], [place] [address] [phone]  2:27 PM

## 2020-11-13 NOTE — Telephone Encounter (Signed)
Returned the call to the patient. She stated that starting yesterday she developed a bad headache, runny nose and low grade fever. She feels like it could be from the Digoxin since she read online that it can cause a headache. She held the digoxin this morning.  She has been advised that these are not typical side effects and she may be sick. She will be tested on Saturday for COVID but has been advised to call her PCP and see if they can get her in sooner to be tested and assessed.

## 2020-11-18 NOTE — Progress Notes (Signed)
Remote pacemaker transmission.   

## 2020-11-27 ENCOUNTER — Telehealth: Payer: Self-pay | Admitting: Cardiovascular Disease

## 2020-11-27 NOTE — Telephone Encounter (Signed)
I really think her symptoms are related to her heart failure.  The digoxin should help this.  Please try to encourage her to take her medications as this will help her.  Of note, she has been rather reluctant to take medications.  [name]. Audie Box, MD, [place]  [address] [place], [place] [address] [phone]  5:27 PM

## 2020-11-27 NOTE — Telephone Encounter (Signed)
Spoke with patient. She reports she's noticing her heart rate is very prominent at night. She can feel each beat and she is not used to this. She also has been having loose stools and itching at her rectum with her bowel movements. She has been having halos around her vision and circular motions with her vision but this has been happening before her medications were changed. She wants Dr. Audie Box to let her know if she can take 1/2 of the digoxin daily instead of the whole pill and see if any of these issues resolve. No chest pain or tightness in chest. Advised if she has these symptoms to call 911 for evaluation. Patient verbalized understanding. Will route to MD and primary nurse for evaluation.   BP: 2/13 - 100/66, 73 2/14 - 95/70, 73 2/15 - 103/71, 72 2/16 - 89/56, 70 (pacemaker is set to 70)

## 2020-11-27 NOTE — Telephone Encounter (Signed)
Pt c/o medication issue:  1. Name of Medication: Digoxin- 1 time a day Lisinopril- 1 time a day  2. How are you currently taking this medication (dosage and times per day)?   3. Are you having a reaction (difficulty breathing--STAT)? no*  4. What is your medication issue? Tightness in chest , loose bowel

## 2020-11-28 NOTE — Telephone Encounter (Signed)
Called and notified patient per MD okay to do, patient verbalized understanding.

## 2020-11-28 NOTE — Telephone Encounter (Signed)
Yes that is fine.   [name]. Audie Box, MD, [place]  [address], [place], [place] [address] [phone]  1:01 PM

## 2020-11-28 NOTE — Telephone Encounter (Signed)
Follow up:     Patient calling to see if she can take 1/2 of tablet instead of the whole pill.

## 2020-11-28 NOTE — Telephone Encounter (Signed)
Called patient, she is still wanting to cut digoxin in half to see if there is any difference.  I advised with her that per the last message we should continue the same doses, but she would like to give this a try- I told her I would reach out to Dr.O'Neal to get his okay to cut the medication.  Patient verbalized understanding, will await response.

## 2020-12-03 ENCOUNTER — Encounter: Payer: Self-pay | Admitting: Neurology

## 2020-12-03 ENCOUNTER — Telehealth: Payer: Self-pay | Admitting: Neurology

## 2020-12-03 ENCOUNTER — Ambulatory Visit: Payer: Medicare Other | Admitting: Neurology

## 2020-12-03 ENCOUNTER — Other Ambulatory Visit: Payer: Self-pay

## 2020-12-03 VITALS — BP 98/65 | HR 70 | Ht 64.0 in | Wt 122.0 lb

## 2020-12-03 DIAGNOSIS — R519 Headache, unspecified: Secondary | ICD-10-CM

## 2020-12-03 NOTE — Progress Notes (Signed)
[facility]    Provider:  Dr Jaynee Eagles Requesting Provider: Pablo Lawrence, NP Primary Care Provider:  Celene Squibb, MD  CC:  headache  HPI:  Carol Hardin is a 87 y.o. female here as requested by Pablo Lawrence, NP for headaches. Past medical history stroke, paroxysmal atrial fibrillation, osteoporosis, ventricular tachycardia, mitral regurg, migraines, depression, daily headache, cardiomyopathy, bradycardia, arthritis, anxiety, CKD, weight loss, anxiety, DOE.   When she was in [place] they were getting ready to move to [place] she rushed out and slipped and fell and fractured her hip March 2019. She went to PT and in April finally moved into her new home. At some point she was administered amiodarone and she got a pacemaker, she had multiple hospitalizations, she lost weight. The headaches started even back in PT. She can't take ibuprofen. Biofreeze on the forehead would help. Now she has them every day, wakes up with them every morning, sometimes she can get rid of them by using biofreeze. They are in the front of the head. They have been worsening. The headaches are bad all day long.It is in the forehead bilaterally. They are severe and can last all day long. Worse in the morning. She is on oxygen overnight, she was having trouble breathing at night. She took herself off of the oxygen. No other focal neurologic deficits, associated symptoms, inciting events or modifiable factors.  I reviewed Pablo Lawrence' notes: Patient was seen in the emergency room in September 21 for a fall, at that time she fell in her kitchen and hit the back of her head, she had a little right finger pain, she had imaging and there were no acute intracranial processes, she had an obvious deformity of her right little finger, this was reduced by the emergency room physician, her finger was splinted and she was referred to Ortho, she reports headaches, the started prior to her fall when she hit  her head on June 27, 2020, examination showed a white female in no acute distress, head normal, eyes normal, pupils equally round reactive to light, extraocular movements intact, disc margins sharp, no exudate noted, ears normal, TMs normal, nose normal, lungs and heart both normal.  Noted "other complicated headache syndrome", has had headaches worse in the morning for several months, she fell and hit her head in September 2021 no focal neuro deficits in the office, headaches are sporadic, may have postconcussive headaches but she had them prior to her fall, referred to neurology.  Patient is on Eliquis.  Blood work drawn November 02, 2019 include a CBC with MCV of 99 otherwise normal, CMP showed BUN 11 and creatinine 0.96 otherwise unremarkable, hemoglobin A1c 5.3, vitamin D 25-hydroxy 39.4.  Reviewed notes, labs and imaging from outside physicians, which showed:  CT head 06/27/2020: personally reviewed imaging and agree with the following: FINDINGS: Brain: Chronic small vessel ischemic changes are seen throughout the periventricular white matter. No signs of acute infarct or hemorrhage. Lateral ventricles and midline structures are unremarkable. No acute extra-axial fluid collections. No mass effect.  Vascular: No hyperdense vessel or unexpected calcification.  Skull: Normal. Negative for fracture or focal lesion.  Sinuses/Orbits: No acute finding.  Other: None.  IMPRESSION: 1. Stable head CT, no acute process.  Review of Systems: Patient complains of symptoms per HPI as well as the following symptoms: headache. Pertinent negatives and positives per HPI. All others negative.   Social History   Socioeconomic History  . Marital status: Married    Spouse name:  Not on file  . Number of children: 4  . Years of education: 2.5 yrs of college  . Highest education level: Not on file  Occupational History  . Not on file  Tobacco Use  . Smoking status: Former Smoker     Packs/day: 1.00    Years: 20.00    Pack years: 20.00    Types: Cigarettes    Quit date: 1989    Years since quitting: 33.1  . Smokeless tobacco: Never Used  Vaping Use  . Vaping Use: Never used  Substance and Sexual Activity  . Alcohol use: No    Comment: quit 1995, was occasional   . Drug use: Never  . Sexual activity: Not Currently  Other Topics Concern  . Not on file  Social History Narrative   Lives at home with spouse   Caffeine: 2 cups coffee/day   Social Determinants of Health   Financial Resource Strain: Not on file  Food Insecurity: Not on file  Transportation Needs: Not on file  Physical Activity: Not on file  Stress: Not on file  Social Connections: Not on file  Intimate Partner Violence: Not on file    Family History  Problem Relation Age of Onset  . Heart failure Mother   . Leukemia Mother   . Heart failure Father   . COPD Father   . Alzheimer's disease Brother   . Heart Problems Brother        stent  . Heart Problems Son        stent   . Allergic rhinitis Neg Hx   . Angioedema Neg Hx   . Asthma Neg Hx   . Eczema Neg Hx   . Atopy Neg Hx   . Immunodeficiency Neg Hx   . Urticaria Neg Hx   . Headache Neg Hx     Past Medical History:  Diagnosis Date  . Anxiety   . Arthritis    "fingers, right toe" (08/09/2016)  . Atrial fibrillation ([place])   . Basal cell carcinoma of lower leg, right   . Bradycardia    a. Holter 08/30/16 showed profound bradycardia down to 30 during awake hours, several 2 second pauses, very frequent PVCs >8000 in 48 hours, and NSVT (longest of 14 beats).  . Cardiomyopathy ([place])    a. EF 40% by echo 08/2016.  . Daily headache    "I usually wake up w/a headache; sometimes it's a migraine" (08/09/2016)  . Depression   . Diverticulosis    Hx. of  . Frequent PVCs   . GERD (gastroesophageal reflux disease)   . Hypercholesterolemia   . Migraine   . Mitral regurgitation   . MVP (mitral valve prolapse)   . NSVT (nonsustained  ventricular tachycardia) ([place])    a. first noted event monitor 08/2016.  . Osteoporosis   . PAF (paroxysmal atrial fibrillation) ([place])    a. in afib at time of echo 08/2016.  Marland Kitchen Pneumonia   . Scoliosis    mild  . Squamous cell carcinoma of neck   . Stroke [employer]) 05/2018    Patient Active Problem List   Diagnosis Date Noted  . Muscular deconditioning 01/07/2020  . Dysphagia 03/30/2019  . Constipation 03/30/2019  . Pacemaker   . Chronic respiratory failure with hypoxia ([place])   . On home O2   . Sick sinus syndrome ([place]) 01/22/2019  . CKD (chronic kidney disease) stage 3, GFR 30-59 ml/min ([facility]) 01/21/2019  . Atrial fibrillation with RVR ([place]) 01/20/2019  .  Acute on chronic systolic CHF (congestive heart failure) ([place]) 12/25/2018  . Tachy-brady syndrome ([place]) 12/25/2018  . Atrial fibrillation, chronic ([place]) 12/24/2018  . Congestive heart failure with left ventricular diastolic dysfunction, unspecified failure chronicity ([place]) 12/24/2018  . Malnutrition of moderate degree 11/20/2018  . Atrial fibrillation with rapid ventricular response ([place]) 11/18/2018  . GERD (gastroesophageal reflux disease) 11/18/2018  . CAP (community acquired pneumonia) 11/18/2018  . Acute on chronic diastolic HF (heart failure) ([place]) 11/18/2018  . Stroke (cerebrum) ([place]) 05/19/2018  . Hip fracture ([place]) 12/31/2017  . PAF (paroxysmal atrial fibrillation) ([place])   . NSVT (nonsustained ventricular tachycardia) ([place])   . Mitral regurgitation   . Frequent PVCs   . Bradycardia   . Essential hypertension   . Hypokalemia   . Diverticulitis 08/09/2016  . Diverticulosis of colon 04/22/2016  . Diverticulosis of large intestine without perforation or abscess without bleeding 04/22/2016  . Generalized anxiety disorder 04/22/2016  . Irritable bowel syndrome without diarrhea 04/22/2016  . Mitral valve prolapse 04/22/2016  . Mixed hyperlipidemia 04/22/2016  . Osteopenia 04/22/2016  . Low blood pressure 09/24/2014  .  Hypercholesterolemia   . History of depression   . Scoliosis   . Osteoporosis   . DOE (dyspnea on exertion) 05/31/2008    Past Surgical History:  Procedure Laterality Date  . AV NODE ABLATION N/A 01/23/2019   Procedure: AV NODE ABLATION;  Surgeon: Thompson Grayer, MD;  Location: [facility];  Service: Cardiovascular;  Laterality: N/A;  . BASAL CELL CARCINOMA EXCISION Right 1980s   RLE  . BIV PACEMAKER INSERTION CRT-P N/A 01/23/2019   Procedure: BIV PACEMAKER INSERTION CRT-P;  Surgeon: Thompson Grayer, MD;  Location: [facility];  Service: Cardiovascular;  Laterality: N/A;  . BLEPHAROPLASTY    . BREAST BIOPSY Left 1970s  . BREAST CYST ASPIRATION Left 1970s  . BREAST CYST EXCISION Left 1970s  . BUNIONECTOMY Bilateral   . DILATION AND CURETTAGE OF UTERUS    . EXCISIONAL HEMORRHOIDECTOMY  1970s  . INTRAMEDULLARY (IM) NAIL INTERTROCHANTERIC Left 12/31/2017   Procedure: INTRAMEDULLARY (IM) NAIL INTERTROCHANTRIC;  Surgeon: Nicholes Stairs, MD;  Location: [facility];  Service: Orthopedics;  Laterality: Left;  . KNEE ARTHROSCOPY Right 04/12/2007  . KNEE ARTHROSCOPY Left 1998  . SQUAMOUS CELL CARCINOMA EXCISION     "neck"  . TONSILLECTOMY      Current Outpatient Medications  Medication Sig Dispense Refill  . acetaminophen (TYLENOL) 325 MG tablet Take 325-975 mg by mouth daily.    . Calcium Carbonate Antacid (TUMS PO) Take by mouth as needed.    . carvedilol (COREG) 3.125 MG tablet Take 1 tablet (3.125 mg total) by mouth 2 (two) times daily. 180 tablet 3  . digoxin (LANOXIN) 0.125 MG tablet Take 1 tablet (0.125 mg total) by mouth daily. (Patient taking differently: Take 0.0625 mg by mouth daily.) 90 tablet 3  . docusate sodium (COLACE) 100 MG capsule Take 100 mg by mouth daily as needed for mild constipation.    Marland Kitchen ELIQUIS 2.5 MG TABS tablet TAKE 1 TABLET(2.5 MG) BY MOUTH TWICE DAILY 60 tablet 10  . Ensure (ENSURE) Take 237 mLs by mouth daily.     . furosemide (LASIX) 20 MG tablet  Take 20 mg by mouth as needed for fluid.    Marland Kitchen lisinopril (ZESTRIL) 2.5 MG tablet Take 1 tablet (2.5 mg total) by mouth daily. 30 tablet 1  . LORazepam (ATIVAN) 1 MG tablet Take 1 mg by mouth at bedtime.    Marland Kitchen  Polyethyl Glycol-Propyl Glycol (SYSTANE OP) Apply to eye as needed.    . Probiotic Product (ALIGN PO) Take by mouth daily.    . Psyllium (METAMUCIL FIBER PO) Take by mouth daily. 1 1/2 teaspoons daily     No current facility-administered medications for this visit.    Allergies as of 12/03/2020 - Review Complete 11/07/2020  Allergen Reaction Noted  . Omeprazole Other (See Comments) 09/01/2016  . Flagyl [metronidazole] Nausea And Vomiting 08/19/2016  . Amiodarone  03/30/2019  . Aripiprazole Other (See Comments) 10/09/2013  . Hylan g-f 20 Other (See Comments) 10/09/2013  . Paroxetine hcl Other (See Comments) 04/23/2011  . Statins Other (See Comments) 10/18/2018  . Sulfa antibiotics Diarrhea and Other (See Comments) 05/19/2018  . Toprol xl [metoprolol succinate] Other (See Comments) 04/23/2011  . Vancomycin Itching and Other (See Comments) 04/23/2011  . Penicillins Rash     Vitals: BP 98/65 (BP Location: Left Arm, Patient Position: Sitting)   Pulse 70   Ht 5\' 4"  (1.626 m)   Wt 122 lb (55.3 kg)   BMI 20.94 kg/m  Last Weight:  Wt Readings from Last 1 Encounters:  12/03/20 122 lb (55.3 kg)   Last Height:   Ht Readings from Last 1 Encounters:  12/03/20 5\' 4"  (1.626 m)     Physical exam: Exam: Gen: NAD, conversant, well nourised, thin and frail, well groomed                     CV: RRR, no MRG. No Carotid Bruits. No peripheral edema, warm, nontender Eyes: Conjunctivae clear without exudates or hemorrhage  Neuro: Detailed Neurologic Exam  Speech:    Speech is normal; fluent and spontaneous   Cognition:    The patient is oriented to person, place, and time;     recent and remote memory impaired ;     language fluent;     Impaired attention, concentration,  fund of  knowledge Cranial Nerves:    The pupils are equal, round, and reactive to light. Could not visualize fundi.. Visual fields are full to finger confrontation. Extraocular movements are intact. Trigeminal sensation is intact and the muscles of mastication are normal. The face is symmetric. The palate elevates in the midline. Hearing impaired. Voice is normal. Shoulder shrug is normal. The tongue has normal motion without fasciculations.   Coordination:    No dymmetria or ataixa  Gait:    Slow and cautious, not parkinsonian or ataxic  Motor Observation:    no involuntary movements noted. Tone:    Normal muscle tone.    Posture:    Posture is normal. normal erect    Strength:    Strength is V/V in the upper and lower limbs.      Sensation: intact to LT     Reflex Exam:  DTR's:    Deep tendon reflexes in the upper and lower extremities are brisk bilaterally.   Toes:    The toes are equivocal bilaterally.   Clonus:    Clonus is absent.    Assessment/Plan:   87 y.o. female here as requested by Pablo Lawrence, NP for headaches. Past medical history stroke, paroxysmal atrial fibrillation, osteoporosis, ventricular tachycardia, mitral regurg, migraines, depression, daily headache, cardiomyopathy, bradycardia, arthritis, anxiety, CKD, weight loss, anxiety, DOE.   Given her morning predominance of headaches, daily morning headaches, and this unclear history of being on oxygen overnight at some point (says she took her self off of it) and she probably needs a sleep study  to see if she has sleep apnea or nocturnal hypoxia which could be causing her morning headaches. I sent an email to her cardiologist Dr. Davina Poke and he is going to set her up for this. I suspect this may be the cause of her morning headaches. Much appreciate my colleague Dr. Audie Box.   Orders Placed This Encounter  Procedures  . CT HEAD WO CONTRAST   No orders of the defined types were placed in this encounter.   Cc:  Pablo Lawrence, NP,  Celene Squibb, MD  Sarina Ill, MD  [facility] [address] [place], [place] [address]  Phone [phone] Fax [phone]

## 2020-12-03 NOTE — Telephone Encounter (Signed)
[employer] medicare order sent to GI. No auth they will reach out to the patient to schedule.  

## 2020-12-03 NOTE — Progress Notes (Signed)
Epworth Sleepiness Scale 0= would never doze 1= slight chance of dozing 2= moderate chance of dozing 3= high chance of dozing  Sitting and reading: 0 Watching TV: 3 Sitting inactive in a public place (ex. Theater or meeting): 0 As a passenger in a car for an hour without a break: no answer Lying down to rest in the afternoon: 3 Sitting and talking to someone: 0 Sitting quietly after lunch (no alcohol): 0 In a car, while stopped in traffic: 0 Total: unable to fully determine

## 2020-12-03 NOTE — Patient Instructions (Signed)
CT of the head Patient needs either a sleep evaluation or overnight pulse ox. I will call her primary care and figure out where this should be completed.

## 2020-12-04 ENCOUNTER — Other Ambulatory Visit: Payer: Self-pay | Admitting: Neurology

## 2020-12-04 DIAGNOSIS — R519 Headache, unspecified: Secondary | ICD-10-CM

## 2020-12-08 ENCOUNTER — Other Ambulatory Visit: Payer: Self-pay

## 2020-12-08 ENCOUNTER — Telehealth: Payer: Self-pay | Admitting: *Deleted

## 2020-12-08 DIAGNOSIS — R5383 Other fatigue: Secondary | ICD-10-CM

## 2020-12-08 DIAGNOSIS — R0683 Snoring: Secondary | ICD-10-CM

## 2020-12-08 NOTE — Telephone Encounter (Signed)
Patient notified of [facility] appointment details. She voiced understanding and also wrote down was was being told to her.

## 2020-12-12 ENCOUNTER — Other Ambulatory Visit: Payer: Self-pay

## 2020-12-12 ENCOUNTER — Ambulatory Visit: Payer: Medicare Other | Attending: Cardiovascular Disease | Admitting: Cardiovascular Disease

## 2020-12-12 DIAGNOSIS — R0683 Snoring: Secondary | ICD-10-CM

## 2020-12-12 DIAGNOSIS — R5383 Other fatigue: Secondary | ICD-10-CM

## 2020-12-17 ENCOUNTER — Other Ambulatory Visit: Payer: Medicare Other

## 2020-12-17 NOTE — Telephone Encounter (Signed)
Patient called wanting to have her CT at [facility].  The first available they had was Tuesday 01/13/21 to arrive at 1:45 pm I spoke with the patient she is aware of time & day. I also gave their number of [phone] incase she needed to r/s.

## 2020-12-19 ENCOUNTER — Other Ambulatory Visit: Payer: Self-pay

## 2020-12-19 ENCOUNTER — Emergency Department (HOSPITAL_COMMUNITY): Payer: Medicare Other

## 2020-12-19 ENCOUNTER — Emergency Department (HOSPITAL_COMMUNITY)
Admission: EM | Admit: 2020-12-19 | Discharge: 2020-12-20 | Disposition: A | Discharge status: Home / Self Care | Payer: Medicare Other | Attending: Emergency Medicine | Admitting: Emergency Medicine

## 2020-12-19 DIAGNOSIS — Z87891 Personal history of nicotine dependence: Secondary | ICD-10-CM | POA: Insufficient documentation

## 2020-12-19 DIAGNOSIS — N183 Chronic kidney disease, stage 3 unspecified: Secondary | ICD-10-CM | POA: Insufficient documentation

## 2020-12-19 DIAGNOSIS — S0990XA Unspecified injury of head, initial encounter: Secondary | ICD-10-CM | POA: Diagnosis not present

## 2020-12-19 DIAGNOSIS — M25522 Pain in left elbow: Secondary | ICD-10-CM | POA: Insufficient documentation

## 2020-12-19 DIAGNOSIS — Z79899 Other long term (current) drug therapy: Secondary | ICD-10-CM | POA: Insufficient documentation

## 2020-12-19 DIAGNOSIS — I5043 Acute on chronic combined systolic (congestive) and diastolic (congestive) heart failure: Secondary | ICD-10-CM | POA: Insufficient documentation

## 2020-12-19 DIAGNOSIS — Z7901 Long term (current) use of anticoagulants: Secondary | ICD-10-CM | POA: Insufficient documentation

## 2020-12-19 DIAGNOSIS — S8992XA Unspecified injury of left lower leg, initial encounter: Secondary | ICD-10-CM | POA: Diagnosis present

## 2020-12-19 DIAGNOSIS — R0602 Shortness of breath: Secondary | ICD-10-CM | POA: Insufficient documentation

## 2020-12-19 DIAGNOSIS — Z85828 Personal history of other malignant neoplasm of skin: Secondary | ICD-10-CM | POA: Insufficient documentation

## 2020-12-19 DIAGNOSIS — I4891 Unspecified atrial fibrillation: Secondary | ICD-10-CM | POA: Insufficient documentation

## 2020-12-19 DIAGNOSIS — R0789 Other chest pain: Secondary | ICD-10-CM | POA: Diagnosis not present

## 2020-12-19 DIAGNOSIS — I130 Hypertensive heart and chronic kidney disease with heart failure and stage 1 through stage 4 chronic kidney disease, or unspecified chronic kidney disease: Secondary | ICD-10-CM | POA: Diagnosis not present

## 2020-12-19 DIAGNOSIS — S80212A Abrasion, left knee, initial encounter: Secondary | ICD-10-CM | POA: Insufficient documentation

## 2020-12-19 DIAGNOSIS — Z950 Presence of cardiac pacemaker: Secondary | ICD-10-CM | POA: Insufficient documentation

## 2020-12-19 DIAGNOSIS — M25512 Pain in left shoulder: Secondary | ICD-10-CM | POA: Diagnosis not present

## 2020-12-19 DIAGNOSIS — T07XXXA Unspecified multiple injuries, initial encounter: Secondary | ICD-10-CM

## 2020-12-19 DIAGNOSIS — W101XXA Fall (on)(from) sidewalk curb, initial encounter: Secondary | ICD-10-CM | POA: Diagnosis not present

## 2020-12-19 DIAGNOSIS — W19XXXA Unspecified fall, initial encounter: Secondary | ICD-10-CM

## 2020-12-19 MED ORDER — HYDROCODONE-ACETAMINOPHEN 5-325 MG PO TABS
1.0000 | ORAL_TABLET | Freq: Once | ORAL | Status: AC
Start: 2020-12-20 — End: 2020-12-19
  Administered 2020-12-19: 1 via ORAL
  Filled 2020-12-19: qty 1

## 2020-12-19 NOTE — ED Triage Notes (Signed)
Pt states that she fell x 2 nights ago, c/o left arm pain and left sided rib pain

## 2020-12-20 ENCOUNTER — Emergency Department (HOSPITAL_COMMUNITY): Payer: Medicare Other

## 2020-12-20 LAB — CBC WITH DIFFERENTIAL/PLATELET
Abs Immature Granulocytes: 0.01 10*3/uL (ref 0.00–0.07)
Basophils Absolute: 0 10*3/uL (ref 0.0–0.1)
Basophils Relative: 1 %
Eosinophils Absolute: 0.2 10*3/uL (ref 0.0–0.5)
Eosinophils Relative: 3 %
HCT: 39.9 % (ref 36.0–46.0)
Hemoglobin: 13 g/dL (ref 12.0–15.0)
Immature Granulocytes: 0 %
Lymphocytes Relative: 30 %
Lymphs Abs: 1.6 10*3/uL (ref 0.7–4.0)
MCH: 33.4 pg (ref 26.0–34.0)
MCHC: 32.6 g/dL (ref 30.0–36.0)
MCV: 102.6 fL — ABNORMAL HIGH (ref 80.0–100.0)
Monocytes Absolute: 0.6 10*3/uL (ref 0.1–1.0)
Monocytes Relative: 12 %
Neutro Abs: 2.9 10*3/uL (ref 1.7–7.7)
Neutrophils Relative %: 54 %
Platelets: 162 10*3/uL (ref 150–400)
RBC: 3.89 MIL/uL (ref 3.87–5.11)
RDW: 13.4 % (ref 11.5–15.5)
WBC: 5.3 10*3/uL (ref 4.0–10.5)
nRBC: 0 % (ref 0.0–0.2)

## 2020-12-20 LAB — BASIC METABOLIC PANEL
Anion gap: 6 (ref 5–15)
BUN: 18 mg/dL (ref 8–23)
CO2: 26 mmol/L (ref 22–32)
Calcium: 9 mg/dL (ref 8.9–10.3)
Chloride: 104 mmol/L (ref 98–111)
Creatinine, Ser: 0.83 mg/dL (ref 0.44–1.00)
GFR, Estimated: >60 mL/min (ref >60)
Glucose, Bld: 98 mg/dL (ref 70–99)
Potassium: 4.3 mmol/L (ref 3.5–5.1)
Sodium: 136 mmol/L (ref 135–145)

## 2020-12-20 LAB — TROPONIN I (HIGH SENSITIVITY)
Troponin I (High Sensitivity): 10 ng/L (ref <18)
Troponin I (High Sensitivity): 12 ng/L (ref <18)

## 2020-12-20 MED ORDER — IOHEXOL 300 MG/ML  SOLN
75.0000 mL | Freq: Once | INTRAMUSCULAR | Status: AC | PRN
  Administered 2020-12-20: 75 mL via INTRAVENOUS

## 2020-12-20 NOTE — Discharge Instructions (Signed)
Your CT scans and x-rays today showed no broken bones or serious injury from your fall.  There is no evidence of any damage to your heart.  You can take Tylenol at home as needed for pain involving your shoulder and ribs.  Follow-up with your doctor next week.  Return to the ED with chest pain, difficulty breathing, any other concerns

## 2020-12-20 NOTE — ED Notes (Signed)
Patient transported to CT 

## 2020-12-20 NOTE — ED Provider Notes (Signed)
[facility] EMERGENCY DEPARTMENT Provider Note   CSN: [id number] Arrival date & time: 12/19/20  2239     History Chief Complaint  Patient presents with  . Fall    Carol Hardin is a 87 y.o. female.  Patient with history of atrial fibrillation on Eliquis, cardiomyopathy (EF 10-15%), GERD, migraine, atrial fibrillation presents with left-sided pain after a fall 2 nights ago.  States she was helping her husband with the trash when she tripped over incline on the sidewalk landing on her left side.  She injured her shoulder, elbow, and ribs.  She denies hitting her head but is not sure.  She been taking Tylenol at home for the pain.  She comes in tonight with worsening pain to her left arm and left ribs with difficulty breathing.  She was concerned the pain could be affecting her heart or coming from her heart.  She denies any new fall.  She denies any focal weakness, numbness or tingling.  She denies any abdominal pain, nausea or vomiting.  She has an abrasion to her left hip and knee as well.  Denies any syncope.  The history is provided by the patient.  Fall Associated symptoms include chest pain and shortness of breath. Pertinent negatives include no abdominal pain and no headaches.       Past Medical History:  Diagnosis Date  . Anxiety   . Arthritis    "fingers, right toe" (08/09/2016)  . Atrial fibrillation ([place])   . Basal cell carcinoma of lower leg, right   . Bradycardia    a. Holter 08/30/16 showed profound bradycardia down to 30 during awake hours, several 2 second pauses, very frequent PVCs >8000 in 48 hours, and NSVT (longest of 14 beats).  . Cardiomyopathy ([place])    a. EF 40% by echo 08/2016.  . Daily headache    "I usually wake up w/a headache; sometimes it's a migraine" (08/09/2016)  . Depression   . Diverticulosis    Hx. of  . Frequent PVCs   . GERD (gastroesophageal reflux disease)   . Hypercholesterolemia   . Migraine   . Mitral regurgitation   . MVP  (mitral valve prolapse)   . NSVT (nonsustained ventricular tachycardia) ([place])    a. first noted event monitor 08/2016.  . Osteoporosis   . PAF (paroxysmal atrial fibrillation) ([place])    a. in afib at time of echo 08/2016.  Marland Kitchen Pneumonia   . Scoliosis    mild  . Squamous cell carcinoma of neck   . Stroke [facility]) 05/2018    Patient Active Problem List   Diagnosis Date Noted  . Muscular deconditioning 01/07/2020  . Dysphagia 03/30/2019  . Constipation 03/30/2019  . Pacemaker   . Chronic respiratory failure with hypoxia ([place])   . On home O2   . Sick sinus syndrome ([place]) 01/22/2019  . CKD (chronic kidney disease) stage 3, GFR 30-59 ml/min (H[facility]) 01/21/2019  . Atrial fibrillation with RVR ([place]) 01/20/2019  . Acute on chronic systolic CHF (congestive heart failure) ([place]) 12/25/2018  . Tachy-brady syndrome ([place]) 12/25/2018  . Atrial fibrillation, chronic ([place]) 12/24/2018  . Congestive heart failure with left ventricular diastolic dysfunction, unspecified failure chronicity ([place]) 12/24/2018  . Malnutrition of moderate degree 11/20/2018  . Atrial fibrillation with rapid ventricular response ([place]) 11/18/2018  . GERD (gastroesophageal reflux disease) 11/18/2018  . CAP (community acquired pneumonia) 11/18/2018  . Acute on chronic diastolic HF (heart failure) ([place]) 11/18/2018  . Stroke (cerebrum) ([place]) 05/19/2018  . Hip  fracture ([place]) 12/31/2017  . PAF (paroxysmal atrial fibrillation) ([place])   . NSVT (nonsustained ventricular tachycardia) ([place])   . Mitral regurgitation   . Frequent PVCs   . Bradycardia   . Essential hypertension   . Hypokalemia   . Diverticulitis 08/09/2016  . Diverticulosis of colon 04/22/2016  . Diverticulosis of large intestine without perforation or abscess without bleeding 04/22/2016  . Generalized anxiety disorder 04/22/2016  . Irritable bowel syndrome without diarrhea 04/22/2016  . Mitral valve prolapse 04/22/2016  . Mixed hyperlipidemia 04/22/2016  . Osteopenia  04/22/2016  . Low blood pressure 09/24/2014  . Hypercholesterolemia   . History of depression   . Scoliosis   . Osteoporosis   . DOE (dyspnea on exertion) 05/31/2008    Past Surgical History:  Procedure Laterality Date  . AV NODE ABLATION N/A 01/23/2019   Procedure: AV NODE ABLATION;  Surgeon: Thompson Grayer, MD;  Location: [place] CV [facility];  Service: Cardiovascular;  Laterality: N/A;  . BASAL CELL CARCINOMA EXCISION Right 1980s   RLE  . BIV PACEMAKER INSERTION CRT-P N/A 01/23/2019   Procedure: BIV PACEMAKER INSERTION CRT-P;  Surgeon: Thompson Grayer, MD;  Location: [facility] [place] [facility];  Service: Cardiovascular;  Laterality: N/A;  . BLEPHAROPLASTY    . BREAST BIOPSY Left 1970s  . BREAST CYST ASPIRATION Left 1970s  . BREAST CYST EXCISION Left 1970s  . BUNIONECTOMY Bilateral   . DILATION AND CURETTAGE OF UTERUS    . EXCISIONAL HEMORRHOIDECTOMY  1970s  . INTRAMEDULLARY (IM) NAIL INTERTROCHANTERIC Left 12/31/2017   Procedure: INTRAMEDULLARY (IM) NAIL INTERTROCHANTRIC;  Surgeon: Nicholes Stairs, MD;  Location: [facility];  Service: Orthopedics;  Laterality: Left;  . KNEE ARTHROSCOPY Right 04/12/2007  . KNEE ARTHROSCOPY Left 1998  . SQUAMOUS CELL CARCINOMA EXCISION     "neck"  . TONSILLECTOMY       OB History    Gravida  6   Para  4   Term  4   Preterm      AB  2   Living        SAB  2   IAB      Ectopic      Multiple      Live Births              Family History  Problem Relation Age of Onset  . Heart failure Mother   . Leukemia Mother   . Heart failure Father   . COPD Father   . Alzheimer's disease Brother   . Heart Problems Brother        stent  . Heart Problems Son        stent   . Allergic rhinitis Neg Hx   . Angioedema Neg Hx   . Asthma Neg Hx   . Eczema Neg Hx   . Atopy Neg Hx   . Immunodeficiency Neg Hx   . Urticaria Neg Hx   . Headache Neg Hx     Social History   Tobacco Use  . Smoking status: Former Smoker    Packs/day: 1.00     Years: 20.00    Pack years: 20.00    Types: Cigarettes    Quit date: 1989    Years since quitting: 33.2  . Smokeless tobacco: Never Used  Vaping Use  . Vaping Use: Never used  Substance Use Topics  . Alcohol use: No    Comment: quit 1995, was occasional   . Drug use: Never    Home Medications Prior  to Admission medications   Medication Sig Start Date End Date Taking? Authorizing Provider  acetaminophen (TYLENOL) 325 MG tablet Take 325-975 mg by mouth daily.    [provider]  Calcium Carbonate Antacid (TUMS PO) Take by mouth as needed.    [provider]  carvedilol (COREG) 3.125 MG tablet Take 1 tablet (3.125 mg total) by mouth 2 (two) times daily. 04/18/20 07/17/20  O'NealCassie Freer, MD  digoxin (LANOXIN) 0.125 MG tablet Take 1 tablet (0.125 mg total) by mouth daily. Patient taking differently: Take 0.0625 mg by mouth daily. 11/07/20   O'NealCassie Freer, MD  docusate sodium (COLACE) 100 MG capsule Take 100 mg by mouth daily as needed for mild constipation.    [provider]  ELIQUIS 2.5 MG TABS tablet TAKE 1 TABLET(2.5 MG) BY MOUTH TWICE DAILY 06/19/20   Allred, Jeneen Rinks, MD  Ensure (ENSURE) Take 237 mLs by mouth daily.     [provider]  furosemide (LASIX) 20 MG tablet Take 20 mg by mouth as needed for fluid.    [provider]  lisinopril (ZESTRIL) 2.5 MG tablet Take 1 tablet (2.5 mg total) by mouth daily. 08/25/20 11/23/20  O'NealCassie Freer, MD  LORazepam (ATIVAN) 1 MG tablet Take 1 mg by mouth at bedtime.    [provider]  Polyethyl Glycol-Propyl Glycol (SYSTANE OP) Apply to eye as needed.    [provider]  Probiotic Product (ALIGN PO) Take by mouth daily.    [provider]  Psyllium (METAMUCIL FIBER PO) Take by mouth daily. 1 1/2 teaspoons daily    [provider]    Allergies    Omeprazole, Flagyl [metronidazole], Amiodarone, Aripiprazole, Hylan g-f 20, Paroxetine hcl, Statins,  Sulfa antibiotics, Toprol xl [metoprolol succinate], Vancomycin, and Penicillins  Review of Systems   Review of Systems  Constitutional: Negative for activity change, appetite change and fever.  HENT: Negative for congestion and rhinorrhea.   Respiratory: Positive for shortness of breath.   Cardiovascular: Positive for chest pain.  Gastrointestinal: Negative for abdominal pain, nausea and vomiting.  Genitourinary: Negative for dysuria and hematuria.  Musculoskeletal: Positive for arthralgias and myalgias. Negative for back pain and neck pain.  Neurological: Negative for dizziness, syncope, weakness and headaches.   all other systems are negative except as noted in the HPI and PMH.   Physical Exam Updated Vital Signs BP 117/89   Pulse 88   Temp 98 F (36.7 C)   Resp 18   Ht 5\' 4"  (1.626 m)   Wt 55.3 kg   SpO2 99%   BMI 20.93 kg/m   Physical Exam Vitals and nursing note reviewed.  Constitutional:      General: She is not in acute distress.    Appearance: She is well-developed.  HENT:     Head: Normocephalic and atraumatic.     Mouth/Throat:     Pharynx: No oropharyngeal exudate.  Eyes:     Conjunctiva/sclera: Conjunctivae normal.     Pupils: Pupils are equal, round, and reactive to light.  Neck:     Comments: No midline C-spine pain Cardiovascular:     Rate and Rhythm: Normal rate and regular rhythm.     Heart sounds: Murmur heard.    Pulmonary:     Effort: Pulmonary effort is normal. No respiratory distress.     Breath sounds: Normal breath sounds.     Comments: Left chest wall tenderness without crepitance or ecchymosis.  Pacemaker in place Chest:  Chest wall: Tenderness present.  Abdominal:     Palpations: Abdomen is soft.     Tenderness: There is no abdominal tenderness. There is no guarding or rebound.  Musculoskeletal:        General: Tenderness and signs of injury present.     Cervical back: Normal range of motion.     Comments: Pain with range of  motion of left shoulder, no obvious deformity.  Full range of motion of left elbow.  Intact radial pulse.  Full range of motion of left hip without pain.  Abrasion left lateral knee.  Pelvis stable. No T or L spine pain.  Skin:    General: Skin is warm.  Neurological:     Mental Status: She is alert and oriented to person, place, and time.     Cranial Nerves: No cranial nerve deficit.     Motor: No abnormal muscle tone.     Coordination: Coordination normal.     Comments:  5/5 strength throughout. CN 2-12 intact.Equal grip strength.   Psychiatric:        Behavior: Behavior normal.     ED Results / Procedures / Treatments   Labs (all labs ordered are listed, but only abnormal results are displayed) Labs Reviewed  CBC WITH DIFFERENTIAL/PLATELET - Abnormal; Notable for the following components:      Result Value   MCV 102.6 (*)    All other components within normal limits  BASIC METABOLIC PANEL  TROPONIN I (HIGH SENSITIVITY)  TROPONIN I (HIGH SENSITIVITY)    EKG EKG Interpretation  Date/Time:  Friday December 19 2020 23:52:07 EST Ventricular Rate:  70 PR Interval:    QRS Duration: 181 QT Interval:  454 QTC Calculation: 490 R Axis:   -87 Text Interpretation: Sinus rhythm Ventricular premature complex IVCD, consider atypical RBBB LVH with secondary repolarization abnormality  likely paced Confirmed by Ezequiel Essex [phone]) on 12/20/2020 12:02:53 AM   Radiology DG Ribs Unilateral W/Chest Left  Result Date: 12/20/2020 CLINICAL DATA:  [place] 2 days ago, left-sided chest wall pain EXAM: LEFT RIBS AND CHEST - 3+ VIEW COMPARISON:  01/20/2019 FINDINGS: Frontal view of the chest as well as frontal and oblique views of the left thoracic cage are obtained. The cardiac silhouette is enlarged, stable since prior study. Multi lead pacemaker identified within the left anterior chest wall. No acute airspace disease, effusion, or pneumothorax. There are no acute displaced rib fractures.  IMPRESSION: 1. No acute displaced fracture. 2. Stable enlarged cardiac silhouette. Electronically Signed   By: Randa Ngo M.D.   On: 12/20/2020 00:54   DG Elbow Complete Left  Result Date: 12/20/2020 CLINICAL DATA:  [place] 2 days ago, pain EXAM: LEFT ELBOW - COMPLETE 3+ VIEW COMPARISON:  None. FINDINGS: Frontal, bilateral oblique, and lateral views of the left elbow are obtained. No fracture, subluxation, or dislocation. Prominent osteoarthritis with marginal osteophyte formation along the medial aspect of the humerus and olecranon. No joint effusion. Soft tissues are unremarkable. IMPRESSION: 1. Osteoarthritis.  No acute fracture. Electronically Signed   By: Randa Ngo M.D.   On: 12/20/2020 00:58   CT Head Wo Contrast  Result Date: 12/20/2020 CLINICAL DATA:  Fall while on anti coagulation therapy. EXAM: CT HEAD WITHOUT CONTRAST CT CERVICAL SPINE WITHOUT CONTRAST TECHNIQUE: Multidetector CT imaging of the head and cervical spine was performed following the standard protocol without intravenous contrast. Multiplanar CT image reconstructions of the cervical spine were also generated. COMPARISON:  None. FINDINGS: CT HEAD FINDINGS Brain: There is no  mass, hemorrhage or extra-axial collection. The size and configuration of the ventricles and extra-axial CSF spaces are normal. There is hypoattenuation of the periventricular white matter, most commonly indicating chronic ischemic microangiopathy. Vascular: There is gas seen within the pterygoid venous plexus and in the cavernous sinuses. Skull: The visualized skull base, calvarium and extracranial soft tissues are normal. Sinuses/Orbits: No fluid levels or advanced mucosal thickening of the visualized paranasal sinuses. No mastoid or middle ear effusion. The orbits are normal. CT CERVICAL SPINE FINDINGS Alignment: No static subluxation. Facets are aligned. Occipital condyles are normally positioned. Skull base and vertebrae: No acute fracture. Soft tissues  and spinal canal: No prevertebral fluid or swelling. No visible canal hematoma. Disc levels: No advanced spinal canal or neural foraminal stenosis. Upper chest: No pneumothorax, pulmonary nodule or pleural effusion. Other: Normal visualized paraspinal cervical soft tissues. IMPRESSION: 1. No acute intracranial abnormality. 2. No acute fracture or static subluxation of the cervical spine. 3. Gas within the pterygoid venous plexus and cavernous sinuses is likely secondary to intravenous access. Electronically Signed   By: Ulyses Jarred M.D.   On: 12/20/2020 02:33   CT Cervical Spine Wo Contrast  Result Date: 12/20/2020 CLINICAL DATA:  Fall while on anti coagulation therapy. EXAM: CT HEAD WITHOUT CONTRAST CT CERVICAL SPINE WITHOUT CONTRAST TECHNIQUE: Multidetector CT imaging of the head and cervical spine was performed following the standard protocol without intravenous contrast. Multiplanar CT image reconstructions of the cervical spine were also generated. COMPARISON:  None. FINDINGS: CT HEAD FINDINGS Brain: There is no mass, hemorrhage or extra-axial collection. The size and configuration of the ventricles and extra-axial CSF spaces are normal. There is hypoattenuation of the periventricular white matter, most commonly indicating chronic ischemic microangiopathy. Vascular: There is gas seen within the pterygoid venous plexus and in the cavernous sinuses. Skull: The visualized skull base, calvarium and extracranial soft tissues are normal. Sinuses/Orbits: No fluid levels or advanced mucosal thickening of the visualized paranasal sinuses. No mastoid or middle ear effusion. The orbits are normal. CT CERVICAL SPINE FINDINGS Alignment: No static subluxation. Facets are aligned. Occipital condyles are normally positioned. Skull base and vertebrae: No acute fracture. Soft tissues and spinal canal: No prevertebral fluid or swelling. No visible canal hematoma. Disc levels: No advanced spinal canal or neural foraminal  stenosis. Upper chest: No pneumothorax, pulmonary nodule or pleural effusion. Other: Normal visualized paraspinal cervical soft tissues. IMPRESSION: 1. No acute intracranial abnormality. 2. No acute fracture or static subluxation of the cervical spine. 3. Gas within the pterygoid venous plexus and cavernous sinuses is likely secondary to intravenous access. Electronically Signed   By: Ulyses Jarred M.D.   On: 12/20/2020 02:33   DG Shoulder Left  Result Date: 12/20/2020 CLINICAL DATA:  [place] 2 days ago, left-sided pain EXAM: LEFT SHOULDER - 2+ VIEW COMPARISON:  None. FINDINGS: Frontal and transscapular views of the left shoulder are obtained. No fracture, subluxation, or dislocation. Joint spaces are well preserved. Left chest is clear. IMPRESSION: 1. Unremarkable left shoulder. Electronically Signed   By: Randa Ngo M.D.   On: 12/20/2020 00:56   DG Knee Complete 4 Views Left  Result Date: 12/20/2020 CLINICAL DATA:  [place] 2 days ago, pain EXAM: LEFT KNEE - COMPLETE 4+ VIEW COMPARISON:  12/31/2017 FINDINGS: Frontal, bilateral oblique, and cross-table lateral views of the left knee are obtained. No fracture, subluxation, or dislocation. Three compartmental osteoarthritis most pronounced in the medial compartment with moderate joint space narrowing and osteophyte formation. Soft tissues are unremarkable. No effusion.  IMPRESSION: 1. Moderate 3 compartmental osteoarthritis.  No acute fracture. Electronically Signed   By: Randa Ngo M.D.   On: 12/20/2020 00:55   DG Hip Unilat W or Wo Pelvis 2-3 Views Left  Result Date: 12/20/2020 CLINICAL DATA:  [place], left-sided pain EXAM: DG HIP (WITH OR WITHOUT PELVIS) 2-3V LEFT COMPARISON:  12/31/2017 FINDINGS: Frontal view of the pelvis as well as frontal and frogleg lateral views of the left hip are obtained. Postsurgical changes from prior left hip ORIF. There are no acute displaced fractures. The hips are well aligned. Sacroiliac joints are normal. IMPRESSION: 1.  No acute displaced fracture. 2. Postsurgical changes left hip. Electronically Signed   By: Randa Ngo M.D.   On: 12/20/2020 00:57    Procedures Procedures   Medications Ordered in ED Medications  HYDROcodone-acetaminophen (NORCO/VICODIN) 5-325 MG per tablet 1 tablet (1 tablet Oral Given 12/19/20 2356)    ED Course  I have reviewed the triage vital signs and the nursing notes.  Pertinent labs & imaging results that were available during my care of the patient were reviewed by me and considered in my medical decision making (see chart for details).    MDM Rules/Calculators/A&P                         Fall 2 nights ago with left-sided pain.  She is anticoagulated on Eliquis. GCS is 15.  ABCs are intact.  She does not think that she hit her head but does not recall.  Complains of pain to her left ribs, left shoulder, left elbow left knee. No abdominal pain or back pain.  EKG is paced.  Low suspicion for ACS.  First troponin is negative. Lungs clear  On exam she has no significant abdominal tenderness or chest wall tenderness.  Chest x-ray is negative for rib fracture or pneumothorax.  X-rays of upper and lower extremities are negative as well.  Given her anticoagulation use, will proceed with CT imaging to rule out intrathoracic or intracranial injury.  CT head and C-spine are negative.  CT of chest shows no rib fractures or pneumothorax.  Patient does have known cardiomegaly with reduced ejection fraction around 20%.  Discussed reassuring results with patient and husband.  No broken bones.  She is able to ambulate.  Recommend Tylenol and supportive care at home.  She has follow-up with your PCP next week.  Notes from her cardiologist document that she does not like to take medications and is already seeing neurology regarding her chronic headaches. Reassured patient and husband regarding her results today.  Troponin is negative x2 and EKG is paced.  Low suspicion for ACS as the  cause of her rib pain.  Follow-up with PCP.  Return to the ED with worsening symptoms Final Clinical Impression(s) / ED Diagnoses Final diagnoses:  Fall, initial encounter  Multiple contusions    Rx / DC Orders ED Discharge Orders    None       Connell Bognar, Annie Main, MD 12/20/20 [phone]

## 2020-12-22 ENCOUNTER — Telehealth: Payer: Self-pay | Admitting: Neurology

## 2020-12-22 NOTE — Telephone Encounter (Signed)
Pt. states that she had a fall on 12/19/20 & she had several contusions following a CAT scan without contrast & would like to be called to discuss results. Please advise.

## 2020-12-23 ENCOUNTER — Other Ambulatory Visit: Payer: Medicare Other

## 2020-12-23 NOTE — Telephone Encounter (Signed)
I reached out to her cardiologist who ordered the sleep test. Looks like they ordered it, she should give them a call thanks

## 2020-12-23 NOTE — Telephone Encounter (Signed)
Spoke with Dr Jaynee Eagles who reviewed result of CT head/c-spine. Per Dr Jaynee Eagles, unremarkable nothing acute. I called the pt to reassure her with this information and she was very appreciative. She stated the ER told her to f/u with PCP but she had not made the appt yet. I recommended she call them. As far as f/u from 2/23 appt with Dr Jaynee Eagles, I let pt know I would check with Dr Jaynee Eagles to see if she spoke with pcp about an overnight 02 study or sleep test. Pt stated she couldn't do the sleep study previously because the device was too close to her pacemaker for her comfort. She said she has had an 02 study completed prior which determined she needed 02 at night. Pt will await a call back. She verbalized appreciation.

## 2020-12-24 NOTE — Telephone Encounter (Signed)
Spoke with the patient and discussed message from Dr Jaynee Eagles. Pt states she's not going to be able to do the sleep test. I encouraged her to call the provider first to discuss the details and she said she would.

## 2020-12-24 NOTE — Telephone Encounter (Signed)
Perfect, thanks. I'll call her today.

## 2020-12-25 ENCOUNTER — Telehealth: Payer: Self-pay | Admitting: Internal Medicine

## 2020-12-25 NOTE — Telephone Encounter (Signed)
Patient advised that x-rays will not affect function of her pacemaker. Attempted to send remote transmission for reassurance but unsuccessful. She will contact [employer] to trouble shoot monitor and call device clinic to advise of results from tech support interaction. # for [employer] and device clinic provided.

## 2020-12-25 NOTE — Telephone Encounter (Signed)
New message   Pt states that she had a fall and had an xray. The xray covered her PPM and she is having pain and feels some sensation around the area. She would like to speak to RN. She said she is unsure if the xray was ok with the PPM.

## 2020-12-26 NOTE — Telephone Encounter (Signed)
Pt is calling stating that she spoke with tech support. She said that they got a report and sent it over. She would like a call back.

## 2020-12-26 NOTE — Telephone Encounter (Signed)
Manual transmission received.  Normal device function.  No arrythmias logged.    Spoke with pt and assured her that device is still working appropriately.  X rays are not known to affect her device.

## 2021-01-06 ENCOUNTER — Telehealth: Payer: Self-pay | Admitting: Cardiovascular Disease

## 2021-01-06 DIAGNOSIS — Z79899 Other long term (current) drug therapy: Secondary | ICD-10-CM

## 2021-01-06 NOTE — Telephone Encounter (Signed)
Pt c/o medication issue:  1. Name of Medication: digoxin (LANOXIN) 0.125 MG tablet  2. How are you currently taking this medication (dosage and times per day)?   3. Are you having a reaction (difficulty breathing--STAT)?   4. What is your medication issue? Patient has been having very soft stools. She is not sure if they are a side effect from this medication or the vitamin D.  Patient states she started taking Vitamin D 25 mcg (1000 iu) for a couple weeks.

## 2021-01-06 NOTE — Telephone Encounter (Signed)
Patient taking Vitamin D 28mcg (1000 iu) daily and digoxin 0.125mg  (1/2 tablet daily).  She reports bowel incontinence. Denies nausea, vomiting, dizziness, or blurry vision.  Instructed to HOLD vitamin D for now and come to clinic for digoxin level test. Will follow up as needed once levels available.  Patient encouraged to call back if additional assistance needed.

## 2021-01-08 ENCOUNTER — Other Ambulatory Visit (HOSPITAL_COMMUNITY): Payer: Self-pay | Admitting: Internal Medicine

## 2021-01-08 DIAGNOSIS — Z13820 Encounter for screening for osteoporosis: Secondary | ICD-10-CM

## 2021-01-08 LAB — DIGOXIN LEVEL: Digoxin, Serum: 0.7 ng/mL (ref 0.5–0.9)

## 2021-01-13 ENCOUNTER — Ambulatory Visit (HOSPITAL_COMMUNITY): Payer: Medicare Other

## 2021-01-13 ENCOUNTER — Telehealth: Payer: Self-pay

## 2021-01-13 NOTE — Telephone Encounter (Signed)
For Seasonal allergies?  Will recommend zyrtec or Claritin (not zyrtec D or Claritin D) instead.

## 2021-01-13 NOTE — Telephone Encounter (Signed)
Called patient back to discuss medications, LVM to call back.  Left call back number.

## 2021-01-13 NOTE — Telephone Encounter (Signed)
Called patient, advised of her blood work. Patient has a question regarding if she can take Benadryl or another allergy medication with her current meds.  Advised I would route a message to review with Pharmacy on this and call her back.  Patient verbalized understanding.

## 2021-01-15 NOTE — Telephone Encounter (Signed)
Follow Up:    Retuning Carol Hardin's call from 01-13-21

## 2021-01-15 NOTE — Telephone Encounter (Signed)
Patient aware of recommendations and verbalized understanding

## 2021-01-19 ENCOUNTER — Telehealth: Payer: Self-pay | Admitting: Cardiovascular Disease

## 2021-01-19 NOTE — Telephone Encounter (Signed)
Pt started having soft stools since this afternoon, pt have  been  taking metamucil September 2020. Pt wants to know if she should continue taking Metamucil

## 2021-01-21 NOTE — Telephone Encounter (Signed)
Carol Hardin:  If she wants to stop the digoxin that is fine.  [name]. Audie Box, MD, [place]  [address], [place], [place] [address] [phone]  11:17 AM

## 2021-01-21 NOTE — Telephone Encounter (Signed)
Returned call to patient who states she has been having loose stools that she thinks started at the same time that she started digoxin. She reports she previously had constipation for which she took benefiber and then metamucil but feels that since she has started digoxin that her stools have been loose. I asked if she is willing to continue the medication since Dr. Audie Box felt that it was important for her to take it and she states this is an intolerable problem. I advised that I will forward message to Dr. Audie Box for advice and that someone from our office will call her at a later time. She also asks if we ordered a cologuard test for her and I advised that we did not. She stated ok and hung up.

## 2021-01-21 NOTE — Telephone Encounter (Signed)
Called patient, advised of message below-  Patient will stop the medication and then discuss at her upcoming visit on 04/26. Patient verbalized understanding.

## 2021-01-21 NOTE — Telephone Encounter (Signed)
PT is calling back she is taking metamucil and she is having a soft stool and wants to know why this is happening.She states she has not received a callback yet and she cannot keep going threw this.Please advise

## 2021-01-28 ENCOUNTER — Other Ambulatory Visit (HOSPITAL_COMMUNITY): Payer: Medicare Other

## 2021-01-28 ENCOUNTER — Encounter (HOSPITAL_COMMUNITY): Payer: Self-pay

## 2021-01-30 ENCOUNTER — Other Ambulatory Visit: Payer: Self-pay | Admitting: Cardiovascular Disease

## 2021-02-02 NOTE — Progress Notes (Signed)
Cardiology Office Note:   Date:  02/03/2021  NAME:  Carol Hardin    MRN: [id number] DOB:  [birth year]   PCP:  Celene Squibb, MD  Cardiologist:  Carlyle Dolly, MD  Electrophysiologist:  Thompson Grayer, MD   Referring MD: Celene Squibb, MD   Chief Complaint  Patient presents with  . Follow-up    3 months.   History of Present Illness:   Carol Hardin is a 87 y.o. female with a hx of systolic HF, permanent Afib s/p ppm CRT-P who presents for follow-up. Issues with hypotension and has not tolerate GDMT.  She has started to take half a tablet of digoxin and doing well with this.  There may have been an issue with taking vitamin D supplementation at the same time.  She is now on 1/2 tablet of 0.125 mg daily.  She is tolerating Coreg 3.125 mg twice daily.  She is also on lisinopril 2.5 mg daily.  Weight is stable at 121 pounds.  She reports she is not taking Lasix.  She does have venous insufficiency and I have recommended leg elevation as well as compression stockings.  She will look into this.  Her blood pressure is 90/60.  She denies any chest pain or shortness of breath.  She is not active but doing things around the house such as cooking and cleaning without any major limitations.  She overall seems to be doing fairly well.  Her husband is Carol Hardin who also is a patient of mine date of birth 02/07/1938  Echo 06/24/2010: Report reviewed; EF 55-60% with moderate MR and PMVL prolapse  Echo 08/26/2016: Afib with RVR, EF 40%, PMVL prolapse, myxomatous valve with moderate to severe MR Echo 05/20/2018: SB, EF 40%, PMVL prolapse, with moderate to severe MR Echo 11/19/2018: EF 35-40%, PMVL prolapse, with moderate to severe MR Echo 03/05/2020: EF 20%, Severely dilated LV, LVIDD 7.0 cm, LVOT VTI 12 cm, BIV Paced, Severe MR  Problem List 1.Mitral Valve prolapse (PMVL prolapse with moderate to severe MR in 2017) 2. Systolic HF, EF 26% -non-ischemic etiology? -LVIDD 7.0 cm 3  SSS/Afib/Tachy-brady s/p CRT-P 4. Permanent Afib s/p AVN ablation  -intolerant of rhythm control agents and AVN ablation pursued 5. CVA -05/2018  Past Medical History: Past Medical History:  Diagnosis Date  . Anxiety   . Arthritis    "fingers, right toe" (08/09/2016)  . Atrial fibrillation ([place])   . Basal cell carcinoma of lower leg, right   . Bradycardia    a. Holter 08/30/16 showed profound bradycardia down to 30 during awake hours, several 2 second pauses, very frequent PVCs >8000 in 48 hours, and NSVT (longest of 14 beats).  . Cardiomyopathy ([place])    a. EF 40% by echo 08/2016.  . Daily headache    "I usually wake up w/a headache; sometimes it's a migraine" (08/09/2016)  . Depression   . Diverticulosis    Hx. of  . Frequent PVCs   . GERD (gastroesophageal reflux disease)   . Hypercholesterolemia   . Migraine   . Mitral regurgitation   . MVP (mitral valve prolapse)   . NSVT (nonsustained ventricular tachycardia) ([place])    a. first noted event monitor 08/2016.  . Osteoporosis   . PAF (paroxysmal atrial fibrillation) ([place])    a. in afib at time of echo 08/2016.  Marland Kitchen Pneumonia   . Scoliosis    mild  . Squamous cell carcinoma of neck   . Stroke [facility]) 05/2018  Past Surgical History: Past Surgical History:  Procedure Laterality Date  . AV NODE ABLATION N/A 01/23/2019   Procedure: AV NODE ABLATION;  Surgeon: Thompson Grayer, MD;  Location: [facility];  Service: Cardiovascular;  Laterality: N/A;  . BASAL CELL CARCINOMA EXCISION Right 1980s   RLE  . BIV PACEMAKER INSERTION CRT-P N/A 01/23/2019   Procedure: BIV PACEMAKER INSERTION CRT-P;  Surgeon: Thompson Grayer, MD;  Location: [facility];  Service: Cardiovascular;  Laterality: N/A;  . BLEPHAROPLASTY    . BREAST BIOPSY Left 1970s  . BREAST CYST ASPIRATION Left 1970s  . BREAST CYST EXCISION Left 1970s  . BUNIONECTOMY Bilateral   . DILATION AND CURETTAGE OF UTERUS    . EXCISIONAL HEMORRHOIDECTOMY  1970s  .  INTRAMEDULLARY (IM) NAIL INTERTROCHANTERIC Left 12/31/2017   Procedure: INTRAMEDULLARY (IM) NAIL INTERTROCHANTRIC;  Surgeon: Nicholes Stairs, MD;  Location: [facility];  Service: Orthopedics;  Laterality: Left;  . KNEE ARTHROSCOPY Right 04/12/2007  . KNEE ARTHROSCOPY Left 1998  . SQUAMOUS CELL CARCINOMA EXCISION     "neck"  . TONSILLECTOMY      Current Medications: Current Meds  Medication Sig  . acetaminophen (TYLENOL) 325 MG tablet Take 325-975 mg by mouth daily.  . Calcium Carbonate Antacid (TUMS PO) Take by mouth as needed.  . carvedilol (COREG) 3.125 MG tablet Take 1 tablet (3.125 mg total) by mouth 2 (two) times daily.  Marland Kitchen docusate sodium (COLACE) 100 MG capsule Take 100 mg by mouth daily as needed for mild constipation.  Marland Kitchen ELIQUIS 2.5 MG TABS tablet TAKE 1 TABLET(2.5 MG) BY MOUTH TWICE DAILY  . Ensure (ENSURE) Take 237 mLs by mouth daily.   . furosemide (LASIX) 20 MG tablet Take 20 mg by mouth as needed for fluid.  Marland Kitchen lisinopril (ZESTRIL) 2.5 MG tablet TAKE 1 TABLET(2.5 MG) BY MOUTH DAILY  . LORazepam (ATIVAN) 1 MG tablet Take 1 mg by mouth at bedtime.  [name] Glycol-Propyl Glycol (SYSTANE OP) Apply to eye as needed.  . Probiotic Product (ALIGN PO) Take by mouth daily.  . [DISCONTINUED] digoxin (LANOXIN) 0.125 MG tablet Take 1 tablet (0.125 mg total) by mouth daily. (Patient taking differently: Take 0.0625 mg by mouth daily.)  . [DISCONTINUED] Psyllium (METAMUCIL FIBER PO) Take by mouth daily. 1 1/2 teaspoons daily     Allergies:    Omeprazole, Flagyl [metronidazole], Amiodarone, Aripiprazole, Hylan g-f 20, Paroxetine hcl, Statins, Sulfa antibiotics, Toprol xl [metoprolol succinate], Vancomycin, and Penicillins   Social History: Social History   Socioeconomic History  . Marital status: Married    Spouse name: Not on file  . Number of children: 4  . Years of education: 2.5 yrs of college  . Highest education level: Not on file  Occupational History  . Not on file   Tobacco Use  . Smoking status: Former Smoker    Packs/day: 1.00    Years: 20.00    Pack years: 20.00    Types: Cigarettes    Quit date: 1989    Years since quitting: 33.3  . Smokeless tobacco: Never Used  Vaping Use  . Vaping Use: Never used  Substance and Sexual Activity  . Alcohol use: No    Comment: quit 1995, was occasional   . Drug use: Never  . Sexual activity: Not Currently  Other Topics Concern  . Not on file  Social History Narrative   Lives at home with spouse   Caffeine: 2 cups coffee/day   Social Determinants of Health   Financial  Resource Strain: Not on file  Food Insecurity: Not on file  Transportation Needs: Not on file  Physical Activity: Not on file  Stress: Not on file  Social Connections: Not on file     Family History: The patient's family history includes Alzheimer's disease in her brother; COPD in her father; Heart Problems in her brother and son; Heart failure in her father and mother; Leukemia in her mother. There is no history of Allergic rhinitis, Angioedema, Asthma, Eczema, Atopy, Immunodeficiency, Urticaria, or Headache.  ROS:   All other ROS reviewed and negative. Pertinent positives noted in the HPI.     EKGs/Labs/Other Studies Reviewed:   The following studies were personally reviewed by me today:  TTE 03/05/2020 1. Left ventricular ejection fraction, by estimation, is 20%. The left  ventricle has severely decreased function. The left ventricle demonstrates  regional wall motion abnormalities (see scoring diagram/findings for  description). The left ventricular  internal cavity size was moderately dilated. Left ventricular diastolic  parameters are indeterminate. Elevated left atrial pressure.  2. Right ventricular systolic function is moderately reduced. The right  ventricular size is moderately enlarged. There is mildly elevated  pulmonary artery systolic pressure.  3. Left atrial size was severely dilated.  4. Right atrial  size was moderately dilated.  5. The mitral valve is degenerative. Moderate to severe mitral valve  regurgitation.  6. Tricuspid valve regurgitation is moderate.  7. The aortic valve is tricuspid. Aortic valve regurgitation is mild. No  aortic stenosis is present.  8. The inferior vena cava is dilated in size with <50% respiratory  variability, suggesting right atrial pressure of 15 mmHg.   Recent Labs: 04/23/2020: ALT 21; BNP 444.6 08/11/2020: TSH 3.66 12/19/2020: BUN 18; Creatinine, Ser 0.83; Hemoglobin 13.0; Platelets 162; Potassium 4.3; Sodium 136   Recent Lipid Panel    Component Value Date/Time   CHOL 134 08/11/2020 0000   TRIG 67 08/11/2020 0000   HDL 41 08/11/2020 0000   CHOLHDL 3.3 01/23/2019 0631   VLDL 13 01/23/2019 0631   LDLCALC 79 08/11/2020 0000    Physical Exam:   VS:  BP 90/60 (BP Location: Left Arm, Patient Position: Sitting, Cuff Size: Normal)   Pulse 72   Ht 5\' 4"  (1.626 m)   Wt 121 lb (54.9 kg)   BMI 20.77 kg/m    Wt Readings from Last 3 Encounters:  02/03/21 121 lb (54.9 kg)  12/19/20 121 lb 14.6 oz (55.3 kg)  12/03/20 122 lb (55.3 kg)    General: Well nourished, well developed, in no acute distress Head: Atraumatic, normal size  Eyes: PEERLA, EOMI  Neck: Supple, no JVD Endocrine: No thryomegaly Cardiac: Normal S1, S2; RRR; no murmurs, rubs, or gallops Lungs: Clear to auscultation bilaterally, no wheezing, rhonchi or rales  Abd: Soft, nontender, no hepatomegaly  Ext: No edema, pulses 2+ Musculoskeletal: No deformities, BUE and BLE strength normal and equal Skin: Warm and dry, no rashes   Neuro: Alert and oriented to person, place, time, and situation, CNII-XII grossly intact, no focal deficits  Psych: Normal mood and affect   ASSESSMENT:   Carol Hardin is a 87 y.o. female who presents for the following: 1. Chronic systolic heart failure ([place])   2. Nonrheumatic mitral valve regurgitation   3. Permanent atrial fibrillation ([place])   4.  Tachy-brady syndrome ([place])     PLAN:   1. Chronic systolic heart failure (HCC) -Nonischemic cardiomyopathy, EF less than 20%.  Possibly this is related to mitral valve regurgitation  in the past.  Her left ventricular internal diameter in diastole 7 cm.  I fear she has an end-stage cardiomyopathy.  Unclear if she actually had mitral valve disease that led to all this and now she just has a burned-out LV.  Nonetheless given her age I would just recommend medical therapy at this time. -She is tolerating 1/2 tablet of digoxin 0.125 mg daily. -Continue Coreg 3.125 mg twice daily. -Continue lisinopril 2.5 mg daily. -She takes Lasix as needed.  Bigger issue is venous insufficiency.  She will continue with elevating her legs as well as compression stockings. -Given her age she is not a candidate for aggressive therapies.  She is not interested in this anyway.  I would not recommend an ICD.  2. Nonrheumatic mitral valve regurgitation -Secondary to nonischemic cardiomyopathy.  Continue medical management as above.  3. Permanent atrial fibrillation ([place]) 4. Tachy-brady syndrome (HCC) -History of sick sinus syndrome and tachybradycardia syndrome.  Status post CRT-P.  She is also status post AV nodal ablation.  She has been intolerant of several medications in the past.  She is to be doing well.  No issues with her pacemaker. -I would not recommend upgrade to defibrillator given her age. -She is tolerating Eliquis 2.5 mg twice daily well.  This is the appropriate dose given her age and body weight.  Disposition: Return in about 6 months (around 08/05/2021).  Medication Adjustments/Labs and Tests Ordered: Current medicines are reviewed at length with the patient today.  Concerns regarding medicines are outlined above.  No orders of the defined types were placed in this encounter.  Meds ordered this encounter  Medications  . digoxin (LANOXIN) 0.125 MG tablet    Sig: Take 0.5 tablets (0.0625 mg total)  by mouth daily.    Dispense:  30 tablet    Refill:  3    Patient Instructions  Medication Instructions:  The current medical regimen is effective;  continue present plan and medications.  *If you need a refill on your cardiac medications before your next appointment, please call your pharmacy*   Follow-Up: At [facility], you and your health needs are our priority.  As part of our continuing mission to provide you with exceptional heart care, we have created designated Provider Care Teams.  These Care Teams include your primary Cardiologist (physician) and Advanced Practice Providers (APPs -  Physician Assistants and Nurse Practitioners) who all work together to provide you with the care you need, when you need it.  We recommend signing up for the patient portal called "MyChart".  Sign up information is provided on this After Visit Summary.  MyChart is used to connect with patients for Virtual Visits (Telemedicine).  Patients are able to view lab/test results, encounter notes, upcoming appointments, etc.  Non-urgent messages can be sent to your provider as well.   To learn more about what you can do with MyChart, go to [email or web address].    Your next appointment:   6 month(s)  The format for your next appointment:   In Person  Provider:   Eleonore Chiquito, MD        Time Spent with Patient: I have spent a total of 25 minutes with patient reviewing hospital notes, telemetry, EKGs, labs and examining the patient as well as establishing an assessment and plan that was discussed with the patient.  > 50% of time was spent in direct patient care.  Signed, Addison Naegeli. Audie Box, MD, [place]  [address]  [place], [place] [address] [phone]  02/03/2021 2:18 PM

## 2021-02-03 ENCOUNTER — Other Ambulatory Visit: Payer: Self-pay

## 2021-02-03 ENCOUNTER — Ambulatory Visit: Payer: Medicare Other | Admitting: Cardiovascular Disease

## 2021-02-03 ENCOUNTER — Encounter: Payer: Self-pay | Admitting: Cardiovascular Disease

## 2021-02-03 VITALS — BP 90/60 | HR 72 | Ht 64.0 in | Wt 121.0 lb

## 2021-02-03 DIAGNOSIS — I340 Nonrheumatic mitral (valve) insufficiency: Secondary | ICD-10-CM

## 2021-02-03 DIAGNOSIS — I495 Sick sinus syndrome: Secondary | ICD-10-CM | POA: Diagnosis not present

## 2021-02-03 DIAGNOSIS — I4821 Permanent atrial fibrillation: Secondary | ICD-10-CM

## 2021-02-03 DIAGNOSIS — I5022 Chronic systolic (congestive) heart failure: Secondary | ICD-10-CM

## 2021-02-03 MED ORDER — DIGOXIN 125 MCG PO TABS: 0.0625 mg | ORAL_TABLET | Freq: Every day | ORAL | 3 refills | Status: DC

## 2021-02-03 NOTE — Patient Instructions (Signed)
Medication Instructions:  The current medical regimen is effective;  continue present plan and medications.  *If you need a refill on your cardiac medications before your next appointment, please call your pharmacy*   Follow-Up: At [facility], you and your health needs are our priority.  As part of our continuing mission to provide you with exceptional heart care, we have created designated Provider Care Teams.  These Care Teams include your primary Cardiologist (physician) and Advanced Practice Providers (APPs -  Physician Assistants and Nurse Practitioners) who all work together to provide you with the care you need, when you need it.  We recommend signing up for the patient portal called "MyChart".  Sign up information is provided on this After Visit Summary.  MyChart is used to connect with patients for Virtual Visits (Telemedicine).  Patients are able to view lab/test results, encounter notes, upcoming appointments, etc.  Non-urgent messages can be sent to your provider as well.   To learn more about what you can do with MyChart, go to [email or web address].    Your next appointment:   6 month(s)  The format for your next appointment:   In Person  Provider:   [place] O'Neal, MD      

## 2021-02-09 ENCOUNTER — Ambulatory Visit (INDEPENDENT_AMBULATORY_CARE_PROVIDER_SITE_OTHER): Payer: Medicare Other

## 2021-02-09 DIAGNOSIS — I428 Other cardiomyopathies: Secondary | ICD-10-CM | POA: Diagnosis not present

## 2021-02-09 LAB — CUP PACEART REMOTE DEVICE CHECK
Battery Remaining Longevity: 73 mo
Battery Remaining Percentage: 95.5 %
Battery Voltage: 2.99 V
Brady Statistic AP VP Percent: 0 %
Brady Statistic AP VS Percent: 0 %
Brady Statistic AS VP Percent: 0 %
Brady Statistic AS VS Percent: 0 %
Brady Statistic RA Percent Paced: 1 %
Date Time Interrogation Session: 20220502020009
Implantable Lead Implant Date: 20200414
Implantable Lead Implant Date: 20200414
Implantable Lead Implant Date: 20200414
Implantable Lead Location: 753858
Implantable Lead Location: 753859
Implantable Lead Location: 753860
Implantable Pulse Generator Implant Date: 20200414
Lead Channel Impedance Value: 440 Ohm
Lead Channel Impedance Value: 460 Ohm
Lead Channel Impedance Value: 740 Ohm
Lead Channel Pacing Threshold Amplitude: 0.5 V
Lead Channel Pacing Threshold Amplitude: 1 V
Lead Channel Pacing Threshold Pulse Width: 0.5 ms
Lead Channel Pacing Threshold Pulse Width: 0.7 ms
Lead Channel Sensing Intrinsic Amplitude: 1.9 mV
Lead Channel Sensing Intrinsic Amplitude: 12 mV
Lead Channel Setting Pacing Amplitude: 2.5 V
Lead Channel Setting Pacing Amplitude: 2.5 V
Lead Channel Setting Pacing Amplitude: 3.5 V
Lead Channel Setting Pacing Pulse Width: 0.5 ms
Lead Channel Setting Pacing Pulse Width: 0.7 ms
Lead Channel Setting Sensing Sensitivity: 3 mV
Pulse Gen Model: 3562
Pulse Gen Serial Number: 9431558

## 2021-02-16 ENCOUNTER — Other Ambulatory Visit: Payer: Self-pay

## 2021-02-16 ENCOUNTER — Emergency Department (HOSPITAL_COMMUNITY)
Admission: EM | Admit: 2021-02-16 | Discharge: 2021-02-17 | Disposition: A | Discharge status: Home / Self Care | Payer: Medicare Other | Attending: Emergency Medicine | Admitting: Emergency Medicine

## 2021-02-16 ENCOUNTER — Emergency Department (HOSPITAL_COMMUNITY): Payer: Medicare Other

## 2021-02-16 ENCOUNTER — Encounter (HOSPITAL_COMMUNITY): Payer: Self-pay

## 2021-02-16 DIAGNOSIS — Z85828 Personal history of other malignant neoplasm of skin: Secondary | ICD-10-CM | POA: Insufficient documentation

## 2021-02-16 DIAGNOSIS — M6250 Muscle wasting and atrophy, not elsewhere classified, unspecified site: Secondary | ICD-10-CM | POA: Insufficient documentation

## 2021-02-16 DIAGNOSIS — Z87891 Personal history of nicotine dependence: Secondary | ICD-10-CM | POA: Diagnosis not present

## 2021-02-16 DIAGNOSIS — I5043 Acute on chronic combined systolic (congestive) and diastolic (congestive) heart failure: Secondary | ICD-10-CM | POA: Insufficient documentation

## 2021-02-16 DIAGNOSIS — Z79899 Other long term (current) drug therapy: Secondary | ICD-10-CM | POA: Insufficient documentation

## 2021-02-16 DIAGNOSIS — R202 Paresthesia of skin: Secondary | ICD-10-CM

## 2021-02-16 DIAGNOSIS — Z950 Presence of cardiac pacemaker: Secondary | ICD-10-CM | POA: Insufficient documentation

## 2021-02-16 DIAGNOSIS — N183 Chronic kidney disease, stage 3 unspecified: Secondary | ICD-10-CM | POA: Diagnosis not present

## 2021-02-16 DIAGNOSIS — Z7901 Long term (current) use of anticoagulants: Secondary | ICD-10-CM | POA: Diagnosis not present

## 2021-02-16 DIAGNOSIS — I130 Hypertensive heart and chronic kidney disease with heart failure and stage 1 through stage 4 chronic kidney disease, or unspecified chronic kidney disease: Secondary | ICD-10-CM | POA: Insufficient documentation

## 2021-02-16 LAB — DIFFERENTIAL
Abs Immature Granulocytes: 0.01 10*3/uL (ref 0.00–0.07)
Basophils Absolute: 0.1 10*3/uL (ref 0.0–0.1)
Basophils Relative: 1 %
Eosinophils Absolute: 0.2 10*3/uL (ref 0.0–0.5)
Eosinophils Relative: 3 %
Immature Granulocytes: 0 %
Lymphocytes Relative: 30 %
Lymphs Abs: 1.9 10*3/uL (ref 0.7–4.0)
Monocytes Absolute: 0.7 10*3/uL (ref 0.1–1.0)
Monocytes Relative: 11 %
Neutro Abs: 3.4 10*3/uL (ref 1.7–7.7)
Neutrophils Relative %: 55 %

## 2021-02-16 LAB — COMPREHENSIVE METABOLIC PANEL
ALT: 19 U/L (ref 0–44)
AST: 35 U/L (ref 15–41)
Albumin: 4.2 g/dL (ref 3.5–5.0)
Alkaline Phosphatase: 46 U/L (ref 38–126)
Anion gap: 6 (ref 5–15)
BUN: 18 mg/dL (ref 8–23)
CO2: 27 mmol/L (ref 22–32)
Calcium: 8.9 mg/dL (ref 8.9–10.3)
Chloride: 102 mmol/L (ref 98–111)
Creatinine, Ser: 0.91 mg/dL (ref 0.44–1.00)
GFR, Estimated: >60 mL/min (ref >60)
Glucose, Bld: 104 mg/dL — ABNORMAL HIGH (ref 70–99)
Potassium: 4.6 mmol/L (ref 3.5–5.1)
Sodium: 135 mmol/L (ref 135–145)
Total Bilirubin: 1 mg/dL (ref 0.3–1.2)
Total Protein: 8 g/dL (ref 6.5–8.1)

## 2021-02-16 LAB — APTT: aPTT: 28 seconds (ref 24–36)

## 2021-02-16 LAB — CBC
HCT: 40.9 % (ref 36.0–46.0)
Hemoglobin: 13.2 g/dL (ref 12.0–15.0)
MCH: 33.6 pg (ref 26.0–34.0)
MCHC: 32.3 g/dL (ref 30.0–36.0)
MCV: 104.1 fL — ABNORMAL HIGH (ref 80.0–100.0)
Platelets: 188 10*3/uL (ref 150–400)
RBC: 3.93 MIL/uL (ref 3.87–5.11)
RDW: 14 % (ref 11.5–15.5)
WBC: 6.2 10*3/uL (ref 4.0–10.5)
nRBC: 0 % (ref 0.0–0.2)

## 2021-02-16 LAB — PROTIME-INR
INR: 1.1 (ref 0.8–1.2)
Prothrombin Time: 14.3 seconds (ref 11.4–15.2)

## 2021-02-16 MED ORDER — SODIUM CHLORIDE 0.9 % IV BOLUS: 500.0000 mL | Freq: Once | INTRAVENOUS | Status: DC

## 2021-02-16 MED ORDER — SODIUM CHLORIDE 0.9 % IV SOLN: 100.0000 mL/h | INTRAVENOUS | Status: DC

## 2021-02-16 NOTE — ED Triage Notes (Signed)
Pt with right forearm numbness( starting at right thumb to elbow) since waking up from a nap at 1600 today.  Pt states she took a nap at 1500.  Denies HA but states she had one yesterday.  Denies any numbness anywhere else.

## 2021-02-16 NOTE — Discharge Instructions (Signed)
As discussed, your evaluation today has been largely reassuring.  But, it is important that you monitor your condition carefully, and do not hesitate to return to the ED if you develop new, or concerning changes in your condition.  Otherwise, please follow-up with our neurology physician for appropriate ongoing care.

## 2021-02-16 NOTE — ED Provider Notes (Signed)
[facility] EMERGENCY DEPARTMENT Provider Note   CSN: [id number] Arrival date & time: 02/16/21  2127     History Chief Complaint  Patient presents with  . Numbness    Carol Hardin is a 87 y.o. female.  HPI Patient presents with concern of paresthesia.  Onset is unclear, but possibly about 6 hours prior to ED arrival.  She notes that on the dorsal lateral surface of her thumb, with some radiation towards her elbow, right side, she has had different sensation from normal.  No dysesthesia proximal, no speech difficulty, weakness in any extremity, falling. She notes that she has been taking her medication as directed including Eliquis.  She does have multiple medical issues including A. fib, requiring Eliquis, and has a pacemaker in place.  No known history of stroke.  No clear precipitating, alleviating, exacerbating factors, though she notes her symptoms are somewhat improved by the time of my evaluation.    Past Medical History:  Diagnosis Date  . Anxiety   . Arthritis    "fingers, right toe" (08/09/2016)  . Atrial fibrillation ([place])   . Basal cell carcinoma of lower leg, right   . Bradycardia    a. Holter 08/30/16 showed profound bradycardia down to 30 during awake hours, several 2 second pauses, very frequent PVCs >8000 in 48 hours, and NSVT (longest of 14 beats).  . Cardiomyopathy ([place])    a. EF 40% by echo 08/2016.  . Daily headache    "I usually wake up w/a headache; sometimes it's a migraine" (08/09/2016)  . Depression   . Diverticulosis    Hx. of  . Frequent PVCs   . GERD (gastroesophageal reflux disease)   . Hypercholesterolemia   . Migraine   . Mitral regurgitation   . MVP (mitral valve prolapse)   . NSVT (nonsustained ventricular tachycardia) ([place])    a. first noted event monitor 08/2016.  . Osteoporosis   . PAF (paroxysmal atrial fibrillation) ([place])    a. in afib at time of echo 08/2016.  Marland Kitchen Pneumonia   . Scoliosis    mild  . Squamous cell carcinoma of  neck   . Stroke [facility]) 05/2018    Patient Active Problem List   Diagnosis Date Noted  . Osteoporosis 08/26/2020  . History of revision of total replacement of right hip joint 08/26/2020  . Hemorrhoids 08/26/2020  . Fracture of proximal phalanx of finger 07/02/2020  . Disorder of skeletal muscle 01/07/2020  . Muscular deconditioning 01/07/2020  . Dysphagia 03/30/2019  . Constipation 03/30/2019  . Chronic hypoxemic respiratory failure ([place])   . Oxygen dependent   . Cardiac pacemaker in situ 01/23/2019  . Sick sinus syndrome ([place]) 01/22/2019  . CKD (chronic kidney disease) stage 3, GFR 30-59 ml/min (H[facility]) 01/21/2019  . Acute on chronic systolic heart failure ([place]) 12/25/2018  . Tachy-brady syndrome ([place]) 12/25/2018  . Atrial fibrillation, chronic ([place]) 12/24/2018  . Congestive heart failure ([place]) 12/24/2018  . Chronic atrial fibrillation ([place]) 12/24/2018  . Malnutrition, calorie ([place]) 11/20/2018  . Atrial fibrillation with rapid ventricular response ([place]) 11/18/2018  . Community acquired pneumonia 11/18/2018  . Acute on chronic diastolic heart failure ([place]) 11/18/2018  . Trochanteric bursitis of left hip 05/23/2018  . Stroke (cerebrum) ([place]) 05/19/2018  . Hip fracture ([place]) 12/31/2017  . Foreign body of both ears 07/01/2017  . Presbycusis of both ears 07/01/2017  . PAF (paroxysmal atrial fibrillation) ([place])   . Nonsustained ventricular tachycardia ([place])   . Mitral valve regurgitation   .  Ventricular premature beats   . Bradycardia   . Essential hypertension   . Hypokalemia   . Diverticulitis 08/09/2016  . Gastroesophageal reflux disease 07/26/2016  . Globus pharyngeus 07/26/2016  . Hoarseness 07/26/2016  . Diverticulosis of colon 04/22/2016  . Diverticulosis of large intestine 04/22/2016  . Generalized anxiety disorder 04/22/2016  . Irritable bowel syndrome 04/22/2016  . Mitral valve prolapse 04/22/2016  . Mixed hyperlipidemia 04/22/2016  . Osteopenia 04/22/2016  .  Diverticulitis of large intestine 04/22/2016  . Low blood pressure 09/24/2014  . Hypercholesterolemia   . History of depression   . Dyspnea on exertion 05/31/2008    Past Surgical History:  Procedure Laterality Date  . AV NODE ABLATION N/A 01/23/2019   Procedure: AV NODE ABLATION;  Surgeon: Thompson Grayer, MD;  Location: [facility];  Service: Cardiovascular;  Laterality: N/A;  . BASAL CELL CARCINOMA EXCISION Right 1980s   RLE  . BIV PACEMAKER INSERTION CRT-P N/A 01/23/2019   Procedure: BIV PACEMAKER INSERTION CRT-P;  Surgeon: Thompson Grayer, MD;  Location: [facility];  Service: Cardiovascular;  Laterality: N/A;  . BLEPHAROPLASTY    . BREAST BIOPSY Left 1970s  . BREAST CYST ASPIRATION Left 1970s  . BREAST CYST EXCISION Left 1970s  . BUNIONECTOMY Bilateral   . DILATION AND CURETTAGE OF UTERUS    . EXCISIONAL HEMORRHOIDECTOMY  1970s  . INTRAMEDULLARY (IM) NAIL INTERTROCHANTERIC Left 12/31/2017   Procedure: INTRAMEDULLARY (IM) NAIL INTERTROCHANTRIC;  Surgeon: Nicholes Stairs, MD;  Location: [facility];  Service: Orthopedics;  Laterality: Left;  . KNEE ARTHROSCOPY Right 04/12/2007  . KNEE ARTHROSCOPY Left 1998  . SQUAMOUS CELL CARCINOMA EXCISION     "neck"  . TONSILLECTOMY       OB History    Gravida  6   Para  4   Term  4   Preterm      AB  2   Living        SAB  2   IAB      Ectopic      Multiple      Live Births              Family History  Problem Relation Age of Onset  . Heart failure Mother   . Leukemia Mother   . Heart failure Father   . COPD Father   . Alzheimer's disease Brother   . Heart Problems Brother        stent  . Heart Problems Son        stent   . Allergic rhinitis Neg Hx   . Angioedema Neg Hx   . Asthma Neg Hx   . Eczema Neg Hx   . Atopy Neg Hx   . Immunodeficiency Neg Hx   . Urticaria Neg Hx   . Headache Neg Hx     Social History   Tobacco Use  . Smoking status: Former Smoker    Packs/day: 1.00    Years:  20.00    Pack years: 20.00    Types: Cigarettes    Quit date: 1989    Years since quitting: 33.3  . Smokeless tobacco: Never Used  Vaping Use  . Vaping Use: Never used  Substance Use Topics  . Alcohol use: No    Comment: quit 1995, was occasional   . Drug use: Never    Home Medications Prior to Admission medications   Medication Sig Start Date End Date Taking? Authorizing Provider  acetaminophen (TYLENOL) 325 MG tablet  Take 325-975 mg by mouth daily.    [provider]  Calcium Carbonate Antacid (TUMS PO) Take by mouth as needed.    [provider]  carvedilol (COREG) 3.125 MG tablet Take 1 tablet (3.125 mg total) by mouth 2 (two) times daily. 04/18/20 07/17/20  O'NealCassie Freer, MD  digoxin (LANOXIN) 0.125 MG tablet Take 0.5 tablets (0.0625 mg total) by mouth daily. 02/03/21   O'NealCassie Freer, MD  docusate sodium (COLACE) 100 MG capsule Take 100 mg by mouth daily as needed for mild constipation.    [provider]  ELIQUIS 2.5 MG TABS tablet TAKE 1 TABLET(2.5 MG) BY MOUTH TWICE DAILY 06/19/20   Allred, Jeneen Rinks, MD  Ensure (ENSURE) Take 237 mLs by mouth daily.     [provider]  furosemide (LASIX) 20 MG tablet Take 20 mg by mouth as needed for fluid.    [provider]  lisinopril (ZESTRIL) 2.5 MG tablet TAKE 1 TABLET(2.5 MG) BY MOUTH DAILY 01/30/21   O'Neal, Cassie Freer, MD  LORazepam (ATIVAN) 1 MG tablet Take 1 mg by mouth at bedtime.    [provider]  Polyethyl Glycol-Propyl Glycol (SYSTANE OP) Apply to eye as needed.    [provider]  Probiotic Product (ALIGN PO) Take by mouth daily.    [provider]    Allergies    Omeprazole, Flagyl [metronidazole], Amiodarone, Aripiprazole, Hylan g-f 20, Metoprolol tartrate, Paroxetine, Paroxetine hcl, Statins, Sulfa antibiotics, Toprol xl [metoprolol succinate], Vancomycin, and Penicillins  Review of Systems   Review of Systems  Constitutional:       Per  HPI, otherwise negative  HENT:       Per HPI, otherwise negative  Respiratory:       Per HPI, otherwise negative  Cardiovascular:       Per HPI, otherwise negative  Gastrointestinal: Negative for vomiting.  Endocrine:       Negative aside from HPI  Genitourinary:       Neg aside from HPI   Musculoskeletal:       Per HPI, otherwise negative  Skin: Negative.   Neurological: Negative for syncope.    Physical Exam Updated Vital Signs BP 104/70   Pulse 72   Temp 97.7 F (36.5 C) (Oral)   Resp 18   Ht 5\' 4"  (1.626 m)   Wt 54.9 kg   SpO2 96%   BMI 20.77 kg/m   Physical Exam Vitals and nursing note reviewed.  Constitutional:      General: She is not in acute distress.    Appearance: She is well-developed.  HENT:     Head: Normocephalic and atraumatic.  Eyes:     Conjunctiva/sclera: Conjunctivae normal.  Cardiovascular:     Rate and Rhythm: Normal rate and regular rhythm.  Pulmonary:     Effort: Pulmonary effort is normal. No respiratory distress.     Breath sounds: Normal breath sounds. No stridor.  Chest:    Abdominal:     General: There is no distension.  Skin:    General: Skin is warm and dry.  Neurological:     Mental Status: She is alert and oriented to person, place, and time.     Cranial Nerves: No cranial nerve deficit.     Motor: Atrophy present. No weakness, tremor, abnormal muscle tone or pronator drift.     Comments: Neurologic exam unremarkable, no facial asymmetry, speech is clear, patient is oriented, and she describes pain in the extremity where she described numbness  on arrival.     ED Results / Procedures / Treatments   Labs (all labs ordered are listed, but only abnormal results are displayed) Labs Reviewed  CBC - Abnormal; Notable for the following components:      Result Value   MCV 104.1 (*)    All other components within normal limits  COMPREHENSIVE METABOLIC PANEL - Abnormal; Notable for the following components:   Glucose, Bld 104  (*)    All other components within normal limits  PROTIME-INR  APTT  DIFFERENTIAL  RAPID URINE DRUG SCREEN, HOSP PERFORMED  URINALYSIS, ROUTINE W REFLEX MICROSCOPIC  I-STAT CHEM 8, ED    EKG A. fib, rate 69, LVH, IVCD, abnormal  Radiology CT HEAD WO CONTRAST  Result Date: 02/16/2021 CLINICAL DATA:  Left forearm numbness EXAM: CT HEAD WITHOUT CONTRAST TECHNIQUE: Contiguous axial images were obtained from the base of the skull through the vertex without intravenous contrast. COMPARISON:  12/20/2020 FINDINGS: Brain: No evidence of acute infarction, hemorrhage, hydrocephalus, extra-axial collection or mass lesion/mass effect. Mild atrophic and chronic white matter ischemic changes are noted. Vascular: No hyperdense vessel or unexpected calcification. Skull: Normal. Negative for fracture or focal lesion. Sinuses/Orbits: No acute finding. Other: None. IMPRESSION: Mild atrophic and chronic white matter ischemic changes without acute abnormality. Electronically Signed   By: Inez Catalina M.D.   On: 02/16/2021 22:24    Procedures Procedures   Medications Ordered in ED Medications  sodium chloride 0.9 % bolus 500 mL (has no administration in time range)    Followed by  0.9 %  sodium chloride infusion (has no administration in time range)    ED Course  I have reviewed the triage vital signs and the nursing notes.  Pertinent labs & imaging results that were available during my care of the patient were reviewed by me and considered in my medical decision making (see chart for details).    11:33 PM Patient in no distress.  She describes pain in the right forearm around the area of the IV placement. She is now committed by her husband.  We discussed her presentation, together.  With concern for paresthesia on the dorsal aspect of the thumb, in a pattern consistent with tenosynovitis, but lower suspicion for stroke,, particular given the patient's ongoing Eliquis use, absence of objective  neurologic deficiencies, we discussed admission for additional monitoring, versus discharge with Neuro follow-up.  Patient has a preference for this latter option.  Labs, CT, EKG thus far reassuring.  Should remaining studies be similarly reassuring, patient may be considered for discharge with outpatient follow-up, which is her preference.   Final Clinical Impression(s) / ED Diagnoses Final diagnoses:  Paresthesia     Carmin Muskrat, MD 02/16/21 2335

## 2021-02-17 NOTE — ED Provider Notes (Signed)
  Provider Note MRN:  [id number]  Arrival date & time: 02/17/21    ED Course and Medical Decision Making  Assumed care from Dr. Vanita Panda at shift change.  Isolated left thumb and posterior forearm numbness.  Upon further questioning, patient had fallen asleep in a chair and upon wakening noticed the symptoms.  Favoring compression neuropathy or some other peripheral process.  No other neurological deficits.  Patient comfortable with plan for discharge and close follow-up.  Strict return precautions.  Procedures  Final Clinical Impressions(s) / ED Diagnoses     ICD-10-CM   1. Paresthesia  R20.2     ED Discharge Orders    None        Discharge Instructions     As discussed, your evaluation today has been largely reassuring.  But, it is important that you monitor your condition carefully, and do not hesitate to return to the ED if you develop new, or concerning changes in your condition.  Otherwise, please follow-up with our neurology physician for appropriate ongoing care.     Barth Kirks. [place] Small, [place] [email or web address]    Maudie Flakes, MD 02/17/21 [id number]

## 2021-02-20 ENCOUNTER — Telehealth: Payer: Self-pay | Admitting: Cardiovascular Disease

## 2021-02-20 NOTE — Telephone Encounter (Signed)
Pt c/o medication issue:  1. Name of Medication: Digoxin  2. How are you currently taking this medication (dosage and times per day)? 1 time a day  3. Are you having a reaction (difficulty breathing--STAT)? Breathing problems all the time  4. What is your medication issue? Diarrhea, numbness in right arm and fingers- writing for her is getting harder to

## 2021-02-20 NOTE — Telephone Encounter (Signed)
Spoke with pt, she report she was recently seen in the ER for numbness in her finger and hands. She state ER physician doesn't believes she had a stroke but she feels she may had a mini stroke. She report at the time she also had a headache, weakness in her arms, and balance issues. Pt also questioning if she can stop digoxin. She state since starting medication, she been experiencing GI issue such a diarrhea.    Will forward to MD for recommendations.

## 2021-02-22 NOTE — Telephone Encounter (Signed)
Yes, it is ok to stop digoxin  [name]. Audie Box, MD, [place]  [address], [place], [place] [address] [phone]  10:49 AM

## 2021-02-23 NOTE — Telephone Encounter (Signed)
Pt updated with MD's recommendations and verbalized understanding.  

## 2021-02-27 NOTE — Progress Notes (Signed)
Remote pacemaker transmission.   

## 2021-03-22 ENCOUNTER — Telehealth: Payer: Self-pay | Admitting: Cardiology

## 2021-03-22 NOTE — Telephone Encounter (Signed)
Pt called in reporting diarrhea that started earlier today. Is concerned regarding what medications she could take with being on Eliquis. Advised ok to use a dose of pepto-bismol. If symptoms continue then would need to call her PCP. Voiced understanding.

## 2021-03-24 ENCOUNTER — Other Ambulatory Visit: Payer: Self-pay

## 2021-03-24 ENCOUNTER — Telehealth: Payer: Self-pay | Admitting: *Deleted

## 2021-03-24 ENCOUNTER — Ambulatory Visit: Payer: Medicare Other | Admitting: Gastroenterology

## 2021-03-24 ENCOUNTER — Encounter: Payer: Self-pay | Admitting: Gastroenterology

## 2021-03-24 DIAGNOSIS — R634 Abnormal weight loss: Secondary | ICD-10-CM | POA: Diagnosis not present

## 2021-03-24 DIAGNOSIS — R109 Unspecified abdominal pain: Secondary | ICD-10-CM | POA: Diagnosis not present

## 2021-03-24 DIAGNOSIS — K529 Noninfective gastroenteritis and colitis, unspecified: Secondary | ICD-10-CM | POA: Diagnosis not present

## 2021-03-24 NOTE — Telephone Encounter (Signed)
CT abd/pelv WO is scheduled for 6/21 at 12:00pm, arrival 11:45am, npo 4 hrs prior, p/u oral contrast from [facility] at least 1-2 days prior  Called pt , LMOMVM  Per [facility] for PA: Notification/Precertification Requirement Precertification Not Required

## 2021-03-24 NOTE — Patient Instructions (Signed)
I have ordered a CT scan to further evaluate what is going on.  Please complete the stool studies if you are able!  Once I get those results, we can decide on best course of action. For now, you can continue Pepto as needed.  Further recommendations to follow!  I enjoyed seeing you again today! As you know, I value our relationship and want to provide genuine, compassionate, and quality care. I welcome your feedback. If you receive a survey regarding your visit,  I greatly appreciate you taking time to fill this out. See you next time!  Annitta Needs, PhD, ANP-BC [facility]

## 2021-03-24 NOTE — Telephone Encounter (Signed)
Patient returned call and is aware of appt details. She voiced understanding

## 2021-03-24 NOTE — Progress Notes (Signed)
Referring Provider: Celene Squibb, MD Primary Care Physician:  Celene Squibb, MD Primary GI: Dr. Abbey Chatters  Chief Complaint  Patient presents with   Diarrhea    Has took Pepto-has helped intensity. X4 days   Abdominal Pain    Mid abd   Nausea    Little bit    HPI:   Carol Hardin is an 87 y.o. female presenting today with a history of  chronic constipation, GERD, dysphagia, weight loss noted in Dec 2020 but lost to follow-up. No prior EGD. Previously wanting to avoid endoscopic evaluation. Last colonoscopy very remote past and believes may have been at [facility], but records not available in system.   Frequent stool onset after starting Digoxin a few months ago.  Stopped Digoxin on Sunday per patient. Has been taking pepto recently. Stool "slips" out and doesn't realize it. Stool is more like pudding now. Feels like her body is clearing something out. BMs 4-5 times per day. Every time goes to urinate and the stool is coming out again. Usually appetite is good but not currently. Was taking Metamucil in the past that was working well. Not taking colace currently. Ensure once a day. Eating 3 meals a day. Feels like pepto may be "drying" things up a bit.   Noted something poking out from her abdomen when raising her legs to put her pajama pants on. Hurting in mid abdomen, which started today.    Weight June 2020: 135 Sept 2020: 132 Dec 2020 127 Today: 119    Past Medical History:  Diagnosis Date   Anxiety    Arthritis    "fingers, right toe" (08/09/2016)   Atrial fibrillation (HCC)    Basal cell carcinoma of lower leg, right    Bradycardia    a. Holter 08/30/16 showed profound bradycardia down to 30 during awake hours, several 2 second pauses, very frequent PVCs >8000 in 48 hours, and NSVT (longest of 14 beats).   Cardiomyopathy ([place])    a. EF 40% by echo 08/2016.   Daily headache    "I usually wake up w/a headache; sometimes it's a migraine" (08/09/2016)   Depression     Diverticulosis    Hx. of   Frequent PVCs    GERD (gastroesophageal reflux disease)    Hypercholesterolemia    Migraine    Mitral regurgitation    MVP (mitral valve prolapse)    NSVT (nonsustained ventricular tachycardia) ([place])    a. first noted event monitor 08/2016.   Osteoporosis    PAF (paroxysmal atrial fibrillation) ([place])    a. in afib at time of echo 08/2016.   Pneumonia    Scoliosis    mild   Squamous cell carcinoma of neck    Stroke ([place]) 05/2018    Past Surgical History:  Procedure Laterality Date   AV NODE ABLATION N/A 01/23/2019   Procedure: AV NODE ABLATION;  Surgeon: Thompson Grayer, MD;  Location: [facility];  Service: Cardiovascular;  Laterality: N/A;   BASAL CELL CARCINOMA EXCISION Right 1980s   RLE   BIV PACEMAKER INSERTION CRT-P N/A 01/23/2019   Procedure: BIV PACEMAKER INSERTION CRT-P;  Surgeon: Thompson Grayer, MD;  Location: [facility];  Service: Cardiovascular;  Laterality: N/A;   BLEPHAROPLASTY     BREAST BIOPSY Left 1970s   BREAST CYST ASPIRATION Left 1970s   BREAST CYST EXCISION Left 1970s   BUNIONECTOMY Bilateral    DILATION AND CURETTAGE OF UTERUS     EXCISIONAL HEMORRHOIDECTOMY  1970s   INTRAMEDULLARY (IM) NAIL INTERTROCHANTERIC Left 12/31/2017   Procedure: INTRAMEDULLARY (IM) NAIL INTERTROCHANTRIC;  Surgeon: Nicholes Stairs, MD;  Location: [facility];  Service: Orthopedics;  Laterality: Left;   KNEE ARTHROSCOPY Right 04/12/2007   KNEE ARTHROSCOPY Left 1998   SQUAMOUS CELL CARCINOMA EXCISION     "neck"   TONSILLECTOMY      Current Outpatient Medications  Medication Sig Dispense Refill   acetaminophen (TYLENOL) 325 MG tablet Take 325-975 mg by mouth daily.     bismuth subsalicylate (PEPTO BISMOL) 262 MG/15ML suspension Take 30 mLs by mouth as needed.     Calcium Carbonate Antacid (TUMS PO) Take by mouth as needed.     carvedilol (COREG) 3.125 MG tablet Take 1 tablet (3.125 mg total) by mouth 2 (two) times daily. 180 tablet 3    digoxin (LANOXIN) 0.125 MG tablet Take 0.0625 mg by mouth daily.     docusate sodium (COLACE) 100 MG capsule Take 100 mg by mouth daily as needed for mild constipation.     ELIQUIS 2.5 MG TABS tablet TAKE 1 TABLET(2.5 MG) BY MOUTH TWICE DAILY 60 tablet 10   Ensure (ENSURE) Take 237 mLs by mouth daily.      furosemide (LASIX) 20 MG tablet Take 20 mg by mouth as needed for fluid.     lisinopril (ZESTRIL) 2.5 MG tablet TAKE 1 TABLET(2.5 MG) BY MOUTH DAILY 90 tablet 0   LORazepam (ATIVAN) 1 MG tablet Take 1 mg by mouth at bedtime.     Polyethyl Glycol-Propyl Glycol (SYSTANE OP) Apply to eye as needed.     Probiotic Product (ALIGN PO) Take by mouth daily.     No current facility-administered medications for this visit.    Allergies as of 03/24/2021 - Review Complete 03/24/2021  Allergen Reaction Noted   Omeprazole Other (See Comments) 09/01/2016   Flagyl [metronidazole] Nausea And Vomiting 08/19/2016   Amiodarone  03/30/2019   Aripiprazole Other (See Comments) 10/09/2013   Hylan g-f 20 Other (See Comments) 10/09/2013   Metoprolol tartrate  02/16/2021   Paroxetine  02/16/2021   Paroxetine hcl Other (See Comments) 04/23/2011   Statins Other (See Comments) 10/18/2018   Sulfa antibiotics Diarrhea and Other (See Comments) 05/19/2018   Toprol xl [metoprolol succinate] Other (See Comments) 04/23/2011   Vancomycin Itching and Other (See Comments) 04/23/2011   Penicillins Rash     Family History  Problem Relation Age of Onset   Heart failure Mother    Leukemia Mother    Heart failure Father    COPD Father    Alzheimer's disease Brother    Heart Problems Brother        stent   Heart Problems Son        stent    Allergic rhinitis Neg Hx    Angioedema Neg Hx    Asthma Neg Hx    Eczema Neg Hx    Atopy Neg Hx    Immunodeficiency Neg Hx    Urticaria Neg Hx    Headache Neg Hx     Social History   Socioeconomic History   Marital status: Married    Spouse name: Not on file   Number  of children: 4   Years of education: 2.5 yrs of college   Highest education level: Not on file  Occupational History   Not on file  Tobacco Use   Smoking status: Former    Packs/day: 1.00    Years: 20.00    Pack years: 20.00  Types: Cigarettes    Quit date: 23    Years since quitting: 33.4   Smokeless tobacco: Never  Vaping Use   Vaping Use: Never used  Substance and Sexual Activity   Alcohol use: No    Comment: quit 1995, was occasional    Drug use: Never   Sexual activity: Not Currently  Other Topics Concern   Not on file  Social History Narrative   Lives at home with spouse   Caffeine: 2 cups coffee/day   Social Determinants of Health   Financial Resource Strain: Not on file  Food Insecurity: Not on file  Transportation Needs: Not on file  Physical Activity: Not on file  Stress: Not on file  Social Connections: Not on file    Review of Systems: Gen: see HPI CV: Denies chest pain, palpitations, syncope, peripheral edema, and claudication. Resp: Denies dyspnea at rest, cough, wheezing, coughing up blood, and pleurisy. GI: see HPI Derm: Denies rash, itching, dry skin Psych: Denies depression, anxiety, memory loss, confusion. No homicidal or suicidal ideation.  Heme: Denies bruising, bleeding, and enlarged lymph nodes.  Physical Exam: BP 92/62   Pulse 70   Temp (!) 96.9 F (36.1 C) (Temporal)   Ht 5\' 4"  (1.626 m)   Wt 119 lb 12.8 oz (54.3 kg)   BMI 20.56 kg/m  General:   Alert and oriented. No distress noted. Pleasant and cooperative.  Head:  Normocephalic and atraumatic. Eyes:  Conjuctiva clear without scleral icterus. Mouth:  mask in place Abdomen:  +BS, soft, mildly TTP lower abdomen and non-distended. No rebound or guarding. No HSM or masses noted. Msk:  Symmetrical without gross deformities. Normal posture. Extremities:  Without edema. Neurologic:  Alert and  oriented x4 Psych:  Alert and cooperative. Normal mood and  affect.  Carol Hardin: SHERIECE JEFCOAT is an 87 y.o. female presenting today with a history of  chronic constipation, GERD, dysphagia, weight loss noted in Dec 2020 but lost to follow-up and returning now with frequent stools, further weight loss, and abdominal pain.   Frequent stools: she is a difficult historian, but it appears this may have been related to Digoxin but unclear. Continues with pepto and notes some incontinent episodes as well. Pepto has helped. It is hard for me to ascertain if truly having diarrhea or more soft stool: for this reason, we are checking stool studies to further sort out. She may have an overflow type of incontinence occurring, in which case, we will have her resume fiber. Reviewing stool studies first.   Abdominal pain: notes a bulging when she is putting pants on, now with discomfort. I also note that she has lost weight unintentionally. Last colonoscopy many years ago (greater than 10), and no prior EGD. She has been hesitant to pursue endoscopic evaluation and would likely not be a candidate here locally at [facility] with cardiac history and low EF. She does not have any typical symptoms of chronic mesenteric ischemia.    PLAN:  Stool studies now CT abd/pelvis with oral contrast (no IV as national limited supply).  Further recommendations to follow  Annitta Needs, PhD, ANP-BC [facility]

## 2021-03-29 ENCOUNTER — Other Ambulatory Visit: Payer: Self-pay | Admitting: Cardiovascular Disease

## 2021-03-30 ENCOUNTER — Telehealth: Payer: Self-pay | Admitting: Cardiovascular Disease

## 2021-03-30 ENCOUNTER — Telehealth: Payer: Self-pay | Admitting: Internal Medicine

## 2021-03-30 LAB — GASTROINTESTINAL PATHOGEN PANEL PCR
C. difficile Tox A/B, PCR: NOT DETECTED
Campylobacter, PCR: NOT DETECTED
Cryptosporidium, PCR: NOT DETECTED
E coli (ETEC) LT/ST PCR: NOT DETECTED
E coli (STEC) stx1/stx2, PCR: NOT DETECTED
E coli 0157, PCR: NOT DETECTED
Giardia lamblia, PCR: NOT DETECTED
Norovirus, PCR: NOT DETECTED
Rotavirus A, PCR: NOT DETECTED
Salmonella, PCR: NOT DETECTED
Shigella, PCR: NOT DETECTED

## 2021-03-30 LAB — C. DIFFICILE GDH AND TOXIN A/B
GDH ANTIGEN: NOT DETECTED
MICRO NUMBER:: 12022485
SPECIMEN QUALITY:: ADEQUATE
TOXIN A AND B: NOT DETECTED

## 2021-03-30 MED ORDER — CARVEDILOL 3.125 MG PO TABS: 3.1250 mg | ORAL_TABLET | Freq: Two times a day (BID) | ORAL | 3 refills | Status: DC

## 2021-03-30 NOTE — Telephone Encounter (Signed)
Advised patient that a CT scan with contrast does not affect her PPM.

## 2021-03-30 NOTE — Telephone Encounter (Signed)
New message   Pt called to see if it's ok to take the CT contrast with a PPM.

## 2021-03-30 NOTE — Telephone Encounter (Signed)
*  STAT* If patient is at the pharmacy, call can be transferred to refill team.   1. Which medications need to be refilled? (please list name of each medication and dose if known) carvedilol (COREG) 3.125 MG tablet   2. Which pharmacy/location (including street and city if local pharmacy) is medication to be sent to? [facility] [place], [address]  3. Do they need a 30 day or 90 day supply? [place]

## 2021-03-31 ENCOUNTER — Other Ambulatory Visit: Payer: Self-pay

## 2021-03-31 ENCOUNTER — Ambulatory Visit (HOSPITAL_COMMUNITY)
Admission: RE | Admit: 2021-03-31 | Discharge: 2021-03-31 | Disposition: A | Discharge status: Home / Self Care | Payer: Medicare Other | Source: Ambulatory Visit | Attending: Gastroenterology | Admitting: Gastroenterology

## 2021-03-31 DIAGNOSIS — R634 Abnormal weight loss: Secondary | ICD-10-CM | POA: Diagnosis present

## 2021-03-31 DIAGNOSIS — R109 Unspecified abdominal pain: Secondary | ICD-10-CM

## 2021-04-10 ENCOUNTER — Telehealth: Payer: Self-pay | Admitting: Internal Medicine

## 2021-04-10 NOTE — Telephone Encounter (Signed)
Error

## 2021-04-12 ENCOUNTER — Other Ambulatory Visit: Payer: Self-pay | Admitting: Allergy

## 2021-04-12 MED ORDER — PREDNISONE 10 MG PO TABS: 10.0000 mg | ORAL_TABLET | Freq: Every day | ORAL | 0 refills | Status: AC

## 2021-04-12 NOTE — Progress Notes (Signed)
Called by pt.  She was working in garden and states little bugs was floating in the air.  She thinks she is having allergic reaction to something airborne.  She has a basement and states something might be coming up thru the cracks of the wood floors, also states there is an opening in her bathroom where the "little bugs" come in. She states the bugs "zip thru the air" and get around her eyes, nose and ears.  She states she got "a sample of the bug and it has little wings".  She thinks these little bugs have gotten into her hair and feels she has been bitten.  She states her scalp is stinging so bad that it is "taking my breath".  The stinging is so bad that she took hydrocodone she had left over from last year from a procedure.  She also has been taking tylenol.  She states "I know it is an allergy".  She also states having headaches and congestion.  She wants to know what she can do for her scalp.  She did shampoo her hair last night but this did not help the stinging. She has claritin at home and advised to take that.  Also advised since she is having multiple symptoms that to get symptom relief prednisone would help with the scalp inflammation, nasal congestion and headache.  She states she is trying not to have to go to the UC.  I have sent in short prednisone burst 10mg  daily x 5 days.

## 2021-04-15 NOTE — Progress Notes (Signed)
Called and spoke with the patient and she stated that she started taking Claritin and has almost finished the prednisone and her symptoms are much improved.

## 2021-04-15 NOTE — Progress Notes (Signed)
Noted.  We should probably call her to see how she is doing.  Salvatore Marvel, MD [facility] [place]

## 2021-04-16 NOTE — Progress Notes (Signed)
Awesome - thank you!   Salvatore Marvel, MD [facility] [place]

## 2021-04-18 ENCOUNTER — Other Ambulatory Visit: Payer: Self-pay

## 2021-04-18 ENCOUNTER — Encounter: Payer: Self-pay | Admitting: Emergency Medicine

## 2021-04-18 ENCOUNTER — Ambulatory Visit
Admission: EM | Admit: 2021-04-18 | Discharge: 2021-04-18 | Disposition: A | Discharge status: Home / Self Care | Payer: Medicare Other | Attending: Family Medicine | Admitting: Family Medicine

## 2021-04-18 DIAGNOSIS — N390 Urinary tract infection, site not specified: Secondary | ICD-10-CM | POA: Diagnosis not present

## 2021-04-18 DIAGNOSIS — R519 Headache, unspecified: Secondary | ICD-10-CM

## 2021-04-18 LAB — POCT URINALYSIS DIP (MANUAL ENTRY)
Bilirubin, UA: NEGATIVE
Glucose, UA: NEGATIVE mg/dL
Ketones, POC UA: NEGATIVE mg/dL
Nitrite, UA: POSITIVE — AB
Protein Ur, POC: >=300 mg/dL — AB
Spec Grav, UA: 1.025 (ref 1.010–1.025)
Urobilinogen, UA: 1 E.U./dL
pH, UA: 6.5 (ref 5.0–8.0)

## 2021-04-18 MED ORDER — DOXYCYCLINE HYCLATE 100 MG PO CAPS: 100.0000 mg | ORAL_CAPSULE | Freq: Two times a day (BID) | ORAL | 0 refills | Status: AC

## 2021-04-18 MED ORDER — CETIRIZINE HCL 5 MG PO TABS: 5.0000 mg | ORAL_TABLET | Freq: Every day | ORAL | 0 refills | Status: DC

## 2021-04-18 NOTE — ED Triage Notes (Signed)
Urinary urgency and burning on urination since Saturday.  States she was recently treated with prednisone for headache and head itching.

## 2021-04-18 NOTE — ED Provider Notes (Signed)
[facility]    CSN: [id number] Arrival date & time: 04/18/21  1359      History   Chief Complaint No chief complaint on file.   HPI Carol Hardin is a 87 y.o. female.   HPI Patient presents with dysuria and burning x 1 day. She endorses urinary frequency. No fever. No nausea or vomiting. Patient endorse mild pelvic pressure. Normal appetite. Denies any other symptoms  Past Medical History:  Diagnosis Date   Anxiety    Arthritis    "fingers, right toe" (08/09/2016)   Atrial fibrillation (HCC)    Basal cell carcinoma of lower leg, right    Bradycardia    a. Holter 08/30/16 showed profound bradycardia down to 30 during awake hours, several 2 second pauses, very frequent PVCs >8000 in 48 hours, and NSVT (longest of 14 beats).   Cardiomyopathy ([place])    a. EF 40% by echo 08/2016.   Daily headache    "I usually wake up w/a headache; sometimes it's a migraine" (08/09/2016)   Depression    Diverticulosis    Hx. of   Frequent PVCs    GERD (gastroesophageal reflux disease)    Hypercholesterolemia    Migraine    Mitral regurgitation    MVP (mitral valve prolapse)    NSVT (nonsustained ventricular tachycardia) ([place])    a. first noted event monitor 08/2016.   Osteoporosis    PAF (paroxysmal atrial fibrillation) ([place])    a. in afib at time of echo 08/2016.   Pneumonia    Scoliosis    mild   Squamous cell carcinoma of neck    Stroke ([place]) 05/2018    Patient Active Problem List   Diagnosis Date Noted   Frequent stools 03/24/2021   Abdominal pain 03/24/2021   Loss of weight 03/24/2021   Osteoporosis 08/26/2020   History of revision of total replacement of right hip joint 08/26/2020   Hemorrhoids 08/26/2020   Fracture of proximal phalanx of finger 07/02/2020   Disorder of skeletal muscle 01/07/2020   Muscular deconditioning 01/07/2020   Dysphagia 03/30/2019   Constipation 03/30/2019   Chronic hypoxemic respiratory failure ([place])    Oxygen dependent     Cardiac pacemaker in situ 01/23/2019   Sick sinus syndrome ([place]) 01/22/2019   CKD (chronic kidney disease) stage 3, GFR 30-59 ml/min (HCC) 01/21/2019   Acute on chronic systolic heart failure ([place]) 12/25/2018   Tachy-brady syndrome ([place]) 12/25/2018   Atrial fibrillation, chronic (HCC) 12/24/2018   Congestive heart failure ([place]) 12/24/2018   Chronic atrial fibrillation ([place]) 12/24/2018   Malnutrition, calorie ([place]) 11/20/2018   Atrial fibrillation with rapid ventricular response ([place]) 11/18/2018   Community acquired pneumonia 11/18/2018   Acute on chronic diastolic heart failure ([place]) 11/18/2018   Trochanteric bursitis of left hip 05/23/2018   Stroke (cerebrum) ([place]) 05/19/2018   Hip fracture ([place]) 12/31/2017   Foreign body of both ears 07/01/2017   Presbycusis of both ears 07/01/2017   PAF (paroxysmal atrial fibrillation) (HCC)    Nonsustained ventricular tachycardia (HCC)    Mitral valve regurgitation    Ventricular premature beats    Bradycardia    Essential hypertension    Hypokalemia    Diverticulitis 08/09/2016   Gastroesophageal reflux disease 07/26/2016   Globus pharyngeus 07/26/2016   Hoarseness 07/26/2016   Diverticulosis of colon 04/22/2016   Diverticulosis of large intestine 04/22/2016   Generalized anxiety disorder 04/22/2016   Irritable bowel syndrome 04/22/2016   Mitral valve prolapse 04/22/2016   Mixed hyperlipidemia  04/22/2016   Osteopenia 04/22/2016   Diverticulitis of large intestine 04/22/2016   Low blood pressure 09/24/2014   Hypercholesterolemia    History of depression    Dyspnea on exertion 05/31/2008    Past Surgical History:  Procedure Laterality Date   AV NODE ABLATION N/A 01/23/2019   Procedure: AV NODE ABLATION;  Surgeon: Thompson Grayer, MD;  Location: [facility];  Service: Cardiovascular;  Laterality: N/A;   BASAL CELL CARCINOMA EXCISION Right 1980s   RLE   BIV PACEMAKER INSERTION CRT-P N/A 01/23/2019   Procedure: BIV PACEMAKER  INSERTION CRT-P;  Surgeon: Thompson Grayer, MD;  Location: [facility];  Service: Cardiovascular;  Laterality: N/A;   BLEPHAROPLASTY     BREAST BIOPSY Left 1970s   BREAST CYST ASPIRATION Left 1970s   BREAST CYST EXCISION Left 1970s   BUNIONECTOMY Bilateral    DILATION AND CURETTAGE OF UTERUS     EXCISIONAL HEMORRHOIDECTOMY  1970s   INTRAMEDULLARY (IM) NAIL INTERTROCHANTERIC Left 12/31/2017   Procedure: INTRAMEDULLARY (IM) NAIL INTERTROCHANTRIC;  Surgeon: Nicholes Stairs, MD;  Location: [facility];  Service: Orthopedics;  Laterality: Left;   KNEE ARTHROSCOPY Right 04/12/2007   KNEE ARTHROSCOPY Left 1998   SQUAMOUS CELL CARCINOMA EXCISION     "neck"   TONSILLECTOMY      OB History     Gravida  6   Para  4   Term  4   Preterm      AB  2   Living         SAB  2   IAB      Ectopic      Multiple      Live Births               Home Medications    Prior to Admission medications   Medication Sig Start Date End Date Taking? Authorizing Provider  acetaminophen (TYLENOL) 325 MG tablet Take 325-975 mg by mouth daily.    [provider]  bismuth subsalicylate (PEPTO BISMOL) 262 MG/15ML suspension Take 30 mLs by mouth as needed.    [provider]  Calcium Carbonate Antacid (TUMS PO) Take by mouth as needed.    [provider]  carvedilol (COREG) 3.125 MG tablet Take 1 tablet (3.125 mg total) by mouth 2 (two) times daily. 03/30/21 06/28/21  Geralynn Rile, MD  digoxin (LANOXIN) 0.125 MG tablet Take 0.0625 mg by mouth daily.    [provider]  docusate sodium (COLACE) 100 MG capsule Take 100 mg by mouth daily as needed for mild constipation.    [provider]  ELIQUIS 2.5 MG TABS tablet TAKE 1 TABLET(2.5 MG) BY MOUTH TWICE DAILY 06/19/20   Allred, Jeneen Rinks, MD  Ensure (ENSURE) Take 237 mLs by mouth daily.     [provider]  furosemide (LASIX) 20 MG tablet Take 20 mg by mouth as needed for fluid.    [provider]  lisinopril (ZESTRIL) 2.5 MG tablet TAKE 1 TABLET(2.5 MG) BY MOUTH DAILY 01/30/21   O'Neal, Cassie Freer, MD  LORazepam (ATIVAN) 1 MG tablet Take 1 mg by mouth at bedtime.    [provider]  Polyethyl Glycol-Propyl Glycol (SYSTANE OP) Apply to eye as needed.    [provider]  Probiotic Product (ALIGN PO) Take by mouth daily.    [provider]    Family History Family History  Problem Relation Age of Onset   Heart failure Mother    Leukemia Mother  Heart failure Father    COPD Father    Alzheimer's disease Brother    Heart Problems Brother        stent   Heart Problems Son        stent    Allergic rhinitis Neg Hx    Angioedema Neg Hx    Asthma Neg Hx    Eczema Neg Hx    Atopy Neg Hx    Immunodeficiency Neg Hx    Urticaria Neg Hx    Headache Neg Hx     Social History Social History   Tobacco Use   Smoking status: Former    Packs/day: 1.00    Years: 20.00    Pack years: 20.00    Types: Cigarettes    Quit date: 1989    Years since quitting: 33.5   Smokeless tobacco: Never  Vaping Use   Vaping Use: Never used  Substance Use Topics   Alcohol use: No    Comment: quit 1995, was occasional    Drug use: Never     Allergies   Omeprazole, Flagyl [metronidazole], Amiodarone, Aripiprazole, Hylan g-f 20, Metoprolol tartrate, Paroxetine, Paroxetine hcl, Statins, Sulfa antibiotics, Toprol xl [metoprolol succinate], Vancomycin, and Penicillins   Review of Systems Review of Systems Pertinent negatives listed in HPI   Physical Exam Triage Vital Signs ED Triage Vitals  Enc Vitals Group     BP 04/18/21 1540 102/68     Pulse Rate 04/18/21 1540 89     Resp 04/18/21 1540 16     Temp 04/18/21 1540 97.6 F (36.4 C)     Temp Source 04/18/21 1540 Oral     SpO2 04/18/21 1540 92 %     Weight --      Height --      Head Circumference --      Peak Flow --      Pain Score 04/18/21 1542 9     Pain Loc --      Pain Edu? --       Excl. in [place]? --    No data found.  Updated Vital Signs BP 102/68 (BP Location: Right Arm)   Pulse 89   Temp 97.6 F (36.4 C) (Oral)   Resp 16   SpO2 92%   Visual Acuity Right Eye Distance:   Left Eye Distance:   Bilateral Distance:    Right Eye Near:   Left Eye Near:    Bilateral Near:     Physical Exam  General appearance: Alert, thin appearance, chronically ill appearing,  cooperative and in no distress Head: Normocephalic, without obvious abnormality, atraumatic Respiratory: Respirations even and unlabored, normal respiratory rate Heart: Rate and rhythm normal. No gallop or murmurs noted on exam  Abdomen: BS +, no distention, no rebound tenderness, or no CVA tenderness Extremities: No gross deformities Skin: Skin color, texture, turgor normal. No rashes seen  Psych: Appropriate mood and affect. Neurologic: Mental status: Alert, oriented to person, place, and time, thought content appropriate.  UC Treatments / Results  Labs (all labs ordered are listed, but only abnormal results are displayed) Labs Reviewed  POCT URINALYSIS DIP (MANUAL ENTRY) - Abnormal; Notable for the following components:      Result Value   Color, UA straw (*)    Clarity, UA cloudy (*)    Blood, UA large (*)    Protein Ur, POC >=300 (*)    Nitrite, UA Positive (*)    Leukocytes, UA Small (1+) (*)    All  other components within normal limits  URINE CULTURE    EKG   Radiology No results found.  Procedures Procedures (including critical care time)  Medications Ordered in UC Medications - No data to display  Initial Impression / Assessment and Plan / UC Course  I have reviewed the triage vital signs and the nursing notes.  Pertinent labs & imaging results that were available during my care of the patient were reviewed by me and considered in my medical decision making (see chart for details).    Sinus headache resume Cetirizine daily at bedtime. UTI, urine culture pending.  Start  Doxycyline (patient has multiple allergies and intolerances) . Return precautions given. Final Clinical Impressions(s) / UC Diagnoses   Final diagnoses:  Acute lower UTI  Sinus headache   Discharge Instructions   None    ED Prescriptions     Medication Sig Dispense Auth. Provider   cetirizine (ZYRTEC) 5 MG tablet Take 1 tablet (5 mg total) by mouth daily. 90 tablet Scot Jun, FNP   doxycycline (VIBRAMYCIN) 100 MG capsule Take 1 capsule (100 mg total) by mouth 2 (two) times daily for 7 days. 14 capsule Scot Jun, FNP      PDMP not reviewed this encounter.   Scot Jun, [place] 04/23/21 [phone]

## 2021-04-20 ENCOUNTER — Telehealth: Payer: Self-pay | Admitting: Gastroenterology

## 2021-04-20 DIAGNOSIS — R634 Abnormal weight loss: Secondary | ICD-10-CM

## 2021-04-20 DIAGNOSIS — R6881 Early satiety: Secondary | ICD-10-CM

## 2021-04-20 NOTE — Telephone Encounter (Signed)
[facility] clinical pool: please arrange CTA due to weight loss, early satiety, assess for chronic mesenteric ischemia.

## 2021-04-21 NOTE — Addendum Note (Signed)
Addended by: Hassan Rowan on: 04/21/2021 09:48 AM   Modules accepted: Orders

## 2021-04-21 NOTE — Telephone Encounter (Signed)
No PA needed for CTA per [facility] website.  CTA scheduled for 05/13/21 at 2:00pm, arrive at 1:45pm. Liquids only for 4 hours before test.   MyChart message sent to pt to inform her of CTA appt. Letter mailed.

## 2021-04-22 ENCOUNTER — Telehealth (HOSPITAL_COMMUNITY): Payer: Self-pay | Admitting: Emergency Medicine

## 2021-04-22 LAB — URINE CULTURE: Culture: 10000 — AB

## 2021-04-22 MED ORDER — NITROFURANTOIN MONOHYD MACRO 100 MG PO CAPS: 100.0000 mg | ORAL_CAPSULE | Freq: Two times a day (BID) | ORAL | 0 refills | Status: DC

## 2021-04-27 ENCOUNTER — Other Ambulatory Visit (HOSPITAL_COMMUNITY): Payer: Self-pay | Admitting: Neurology

## 2021-04-27 DIAGNOSIS — I639 Cerebral infarction, unspecified: Secondary | ICD-10-CM

## 2021-04-28 ENCOUNTER — Other Ambulatory Visit: Payer: Self-pay | Admitting: Cardiovascular Disease

## 2021-05-05 ENCOUNTER — Other Ambulatory Visit: Payer: Self-pay | Admitting: Internal Medicine

## 2021-05-05 NOTE — Telephone Encounter (Signed)
Pt last saw Dr. Marisue Ivan 02/03/21, last labs 02/16/21 Creat 0.91, age 87, weight 54.3, based on specified criteria pt is on appropriate dosage of Eliquis 2.5mg  BID.  Will refill rx.

## 2021-05-07 ENCOUNTER — Other Ambulatory Visit: Payer: Self-pay

## 2021-05-07 ENCOUNTER — Ambulatory Visit (INDEPENDENT_AMBULATORY_CARE_PROVIDER_SITE_OTHER): Payer: Medicare Other | Admitting: Internal Medicine

## 2021-05-07 ENCOUNTER — Encounter: Payer: Self-pay | Admitting: Internal Medicine

## 2021-05-07 VITALS — BP 92/60 | HR 88 | Ht 64.0 in | Wt 120.0 lb

## 2021-05-07 DIAGNOSIS — I4821 Permanent atrial fibrillation: Secondary | ICD-10-CM | POA: Diagnosis not present

## 2021-05-07 DIAGNOSIS — I428 Other cardiomyopathies: Secondary | ICD-10-CM | POA: Diagnosis not present

## 2021-05-07 DIAGNOSIS — I442 Atrioventricular block, complete: Secondary | ICD-10-CM | POA: Diagnosis not present

## 2021-05-07 LAB — CUP PACEART INCLINIC DEVICE CHECK
Battery Remaining Longevity: 54 mo
Battery Voltage: 2.99 V
Brady Statistic RA Percent Paced: 0.13 %
Brady Statistic RV Percent Paced: 89 %
Date Time Interrogation Session: 20220728162919
Implantable Lead Implant Date: 20200414
Implantable Lead Implant Date: 20200414
Implantable Lead Implant Date: 20200414
Implantable Lead Location: 753858
Implantable Lead Location: 753859
Implantable Lead Location: 753860
Implantable Pulse Generator Implant Date: 20200414
Lead Channel Impedance Value: 437.5 Ohm
Lead Channel Impedance Value: 450 Ohm
Lead Channel Impedance Value: 700 Ohm
Lead Channel Pacing Threshold Amplitude: 0.5 V
Lead Channel Pacing Threshold Amplitude: 1 V
Lead Channel Pacing Threshold Pulse Width: 0.5 ms
Lead Channel Pacing Threshold Pulse Width: 0.7 ms
Lead Channel Sensing Intrinsic Amplitude: 1.3 mV
Lead Channel Setting Pacing Amplitude: 2.5 V
Lead Channel Setting Pacing Amplitude: 2.5 V
Lead Channel Setting Pacing Pulse Width: 0.5 ms
Lead Channel Setting Pacing Pulse Width: 0.7 ms
Lead Channel Setting Sensing Sensitivity: 3 mV
Pulse Gen Model: 3562
Pulse Gen Serial Number: 9431558

## 2021-05-07 NOTE — Progress Notes (Signed)
PCP: Celene Squibb, MD Primary Cardiologist: Dr Marisue Ivan Primary EP:  Dr Rayann Heman  Carol Hardin is a 87 y.o. female who presents today for routine electrophysiology followup.  She continues to be quite anxious.  We discussed a number of issues today.  She is losing weight.  She is worried.  She is fatigued.  Today, she denies symptoms of palpitations, chest pain, shortness of breath,  lower extremity edema, dizziness, presyncope, or syncope.  The patient is otherwise without complaint today.   Past Medical History:  Diagnosis Date   Anxiety    Arthritis    "fingers, right toe" (08/09/2016)   Atrial fibrillation (HCC)    Basal cell carcinoma of lower leg, right    Bradycardia    a. Holter 08/30/16 showed profound bradycardia down to 30 during awake hours, several 2 second pauses, very frequent PVCs >8000 in 48 hours, and NSVT (longest of 14 beats).   Cardiomyopathy ([place])    a. EF 40% by echo 08/2016.   Daily headache    "I usually wake up w/a headache; sometimes it's a migraine" (08/09/2016)   Depression    Diverticulosis    Hx. of   Frequent PVCs    GERD (gastroesophageal reflux disease)    Hypercholesterolemia    Migraine    Mitral regurgitation    MVP (mitral valve prolapse)    NSVT (nonsustained ventricular tachycardia) ([place])    a. first noted event monitor 08/2016.   Osteoporosis    PAF (paroxysmal atrial fibrillation) ([place])    a. in afib at time of echo 08/2016.   Pneumonia    Scoliosis    mild   Squamous cell carcinoma of neck    Stroke ([place]) 05/2018   Past Surgical History:  Procedure Laterality Date   AV NODE ABLATION N/A 01/23/2019   Procedure: AV NODE ABLATION;  Surgeon: Thompson Grayer, MD;  Location: [facility];  Service: Cardiovascular;  Laterality: N/A;   BASAL CELL CARCINOMA EXCISION Right 1980s   RLE   BIV PACEMAKER INSERTION CRT-P N/A 01/23/2019   Procedure: BIV PACEMAKER INSERTION CRT-P;  Surgeon: Thompson Grayer, MD;  Location: [facility];  Service: Cardiovascular;  Laterality: N/A;   BLEPHAROPLASTY     BREAST BIOPSY Left 1970s   BREAST CYST ASPIRATION Left 1970s   BREAST CYST EXCISION Left 1970s   BUNIONECTOMY Bilateral    DILATION AND CURETTAGE OF UTERUS     EXCISIONAL HEMORRHOIDECTOMY  1970s   INTRAMEDULLARY (IM) NAIL INTERTROCHANTERIC Left 12/31/2017   Procedure: INTRAMEDULLARY (IM) NAIL INTERTROCHANTRIC;  Surgeon: Nicholes Stairs, MD;  Location: [facility];  Service: Orthopedics;  Laterality: Left;   KNEE ARTHROSCOPY Right 04/12/2007   KNEE ARTHROSCOPY Left 1998   SQUAMOUS CELL CARCINOMA EXCISION     "neck"   TONSILLECTOMY      ROS- all systems are reviewed and negative except as per HPI above  Current Outpatient Medications  Medication Sig Dispense Refill   acetaminophen (TYLENOL) 325 MG tablet Take 325-975 mg by mouth daily.     bismuth subsalicylate (PEPTO BISMOL) 262 MG/15ML suspension Take 30 mLs by mouth as needed.     Calcium Carbonate Antacid (TUMS PO) Take by mouth as needed.     carvedilol (COREG) 3.125 MG tablet Take 1 tablet (3.125 mg total) by mouth 2 (two) times daily. 180 tablet 3   digoxin (LANOXIN) 0.125 MG tablet Take 0.0625 mg by mouth daily.     docusate sodium (COLACE) 100 MG  capsule Take 100 mg by mouth daily as needed for mild constipation.     ELIQUIS 2.5 MG TABS tablet TAKE 1 TABLET(2.5 MG) BY MOUTH TWICE DAILY 60 tablet 10   Ensure (ENSURE) Take 237 mLs by mouth daily.      furosemide (LASIX) 20 MG tablet Take 20 mg by mouth as needed for fluid.     lisinopril (ZESTRIL) 2.5 MG tablet TAKE 1 TABLET(2.5 MG) BY MOUTH DAILY 90 tablet 0   LORazepam (ATIVAN) 1 MG tablet Take 1 mg by mouth at bedtime.     nitrofurantoin, macrocrystal-monohydrate, (MACROBID) 100 MG capsule Take 1 capsule (100 mg total) by mouth 2 (two) times daily. 10 capsule 0   Polyethyl Glycol-Propyl Glycol (SYSTANE OP) Apply to eye as needed.     Probiotic Product (ALIGN PO) Take by mouth daily.     No current  facility-administered medications for this visit.    Physical Exam: Vitals:   05/07/21 1531  BP: 92/60  Pulse: 88  SpO2: 96%  Weight: 120 lb (54.4 kg)  Height: 5\' 4"  (1.626 m)    GEN- The patient is elderly and frail appearing, alert and oriented x 3 today.   Head- normocephalic, atraumatic Eyes-  Sclera clear, conjunctiva pink Ears- hearing intact Oropharynx- clear Lungs-  normal work of breathing Chest- pacemaker pocket is well healed Heart- Regular rate and rhythm (paced) GI- soft  Extremities- no clubbing, cyanosis, trace edema  Pacemaker interrogation- reviewed in detail today,  See PACEART report  ekg tracing ordered today is personally reviewed and shows afib, V paced with frequent ventricular ectopy  Assessment and Plan:  1. Symptomatic complete heart block Normal pacemaker function See Pace Art report Programmed VVIR today she is device dependant today  2. Permanent afib Rate controlled On eliquis Bmet and cbc today  3. Chronic systolic dysfunction Prior EF 20% Medical therapy is limited by bradycardia Bmet and mg today  4. Frequent ventricular ectopy Asymptomatic Not a candidate for ICD given advanced age Bmet and mg ordered  Risks, benefits and potential toxicities for medications prescribed and/or refilled reviewed with patient today.   Return to see EP APP every 6 months I will see as needed  Thompson Grayer MD, [facility] 05/07/2021 3:48 PM

## 2021-05-07 NOTE — Patient Instructions (Addendum)
Medication Instructions:  Your physician recommends that you continue on your current medications as directed. Please refer to the Current Medication list given to you today.  Labwork: None ordered.  Testing/Procedures: BMP, Mag CBC  Follow-Up: Your physician wants you to follow-up in: 6 months with Thompson Grayer, MD or one of the following Advanced Practice Providers on your designated Care Team:    Legrand Como "Jonni Sanger" Chalmers Cater, [place]   You will receive a reminder letter in the mail two months in advance. If you don't receive a letter, please call our office to schedule the follow-up appointment.  Remote monitoring is used to monitor your Pacemaker of ICD from home. This monitoring reduces the number of office visits required to check your device to one time per year. It allows [place] to keep an eye on the functioning of your device to ensure it is working properly. You are scheduled for a device check from home on 05/11/21. You may send your transmission at any time that day. If you have a wireless device, the transmission will be sent automatically. After your physician reviews your transmission, you will receive a postcard with your next transmission date.  Any Other Special Instructions Will Be Listed Below (If Applicable).  If you need a refill on your cardiac medications before your next appointment, please call your pharmacy.

## 2021-05-08 LAB — BASIC METABOLIC PANEL
BUN/Creatinine Ratio: 12 (ref 12–28)
BUN: 12 mg/dL (ref 8–27)
CO2: 23 mmol/L (ref 20–29)
Calcium: 9.3 mg/dL (ref 8.7–10.3)
Chloride: 105 mmol/L (ref 96–106)
Creatinine, Ser: 0.97 mg/dL (ref 0.57–1.00)
Glucose: 83 mg/dL (ref 65–99)
Potassium: 4.7 mmol/L (ref 3.5–5.2)
Sodium: 142 mmol/L (ref 134–144)
eGFR: 57 mL/min/{1.73_m2} — ABNORMAL LOW (ref >59)

## 2021-05-08 LAB — CBC
Hematocrit: 36.8 % (ref 34.0–46.6)
Hemoglobin: 12.4 g/dL (ref 11.1–15.9)
MCH: 33.2 pg — ABNORMAL HIGH (ref 26.6–33.0)
MCHC: 33.7 g/dL (ref 31.5–35.7)
MCV: 98 fL — ABNORMAL HIGH (ref 79–97)
Platelets: 171 10*3/uL (ref 150–450)
RBC: 3.74 x10E6/uL — ABNORMAL LOW (ref 3.77–5.28)
RDW: 11.8 % (ref 11.7–15.4)
WBC: 7.5 10*3/uL (ref 3.4–10.8)

## 2021-05-08 LAB — MAGNESIUM: Magnesium: 2 mg/dL (ref 1.6–2.3)

## 2021-05-11 ENCOUNTER — Ambulatory Visit (INDEPENDENT_AMBULATORY_CARE_PROVIDER_SITE_OTHER): Payer: Medicare Other

## 2021-05-11 ENCOUNTER — Telehealth: Payer: Self-pay | Admitting: Internal Medicine

## 2021-05-11 DIAGNOSIS — I442 Atrioventricular block, complete: Secondary | ICD-10-CM | POA: Diagnosis not present

## 2021-05-11 LAB — CUP PACEART REMOTE DEVICE CHECK
Battery Remaining Longevity: 57 mo
Battery Remaining Percentage: 63 %
Battery Voltage: 2.99 V
Date Time Interrogation Session: 20220801020009
Implantable Lead Implant Date: 20200414
Implantable Lead Implant Date: 20200414
Implantable Lead Implant Date: 20200414
Implantable Lead Location: 753858
Implantable Lead Location: 753859
Implantable Lead Location: 753860
Implantable Pulse Generator Implant Date: 20200414
Lead Channel Impedance Value: 460 Ohm
Lead Channel Impedance Value: 760 Ohm
Lead Channel Pacing Threshold Amplitude: 0.5 V
Lead Channel Pacing Threshold Amplitude: 1 V
Lead Channel Pacing Threshold Pulse Width: 0.5 ms
Lead Channel Pacing Threshold Pulse Width: 0.7 ms
Lead Channel Sensing Intrinsic Amplitude: 11.7 mV
Lead Channel Setting Pacing Amplitude: 2.5 V
Lead Channel Setting Pacing Amplitude: 2.5 V
Lead Channel Setting Pacing Pulse Width: 0.5 ms
Lead Channel Setting Pacing Pulse Width: 0.7 ms
Lead Channel Setting Sensing Sensitivity: 3 mV
Pulse Gen Model: 3562
Pulse Gen Serial Number: 9431558

## 2021-05-11 NOTE — Telephone Encounter (Signed)
Will forward to Pharmacy to review pt's current med list

## 2021-05-11 NOTE — Telephone Encounter (Signed)
Patient wanted to know if there were any medications that she can take for her Migraines that would not interact what she is currently taking.  She meant to ask when she was in the office on Thursday but forgot.

## 2021-05-12 NOTE — Telephone Encounter (Signed)
Gave patient recommendation from Pharmacist. Verbalized understanding.

## 2021-05-12 NOTE — Telephone Encounter (Signed)
Follow Up:     Pt is returning Carol Hardin's call from today. 

## 2021-05-12 NOTE — Telephone Encounter (Signed)
Recommend patient avoid products such as Excedrin migraine due to it containing aspirin and caffeine.  However, prescription migraine medications look like they should have no interactions.  Recommend patient consult with PCP or neurologist.

## 2021-05-13 ENCOUNTER — Telehealth: Payer: Self-pay | Admitting: Neurology

## 2021-05-13 ENCOUNTER — Ambulatory Visit (HOSPITAL_COMMUNITY): Payer: Medicare Other

## 2021-05-13 ENCOUNTER — Other Ambulatory Visit: Payer: Self-pay

## 2021-05-13 ENCOUNTER — Ambulatory Visit (HOSPITAL_COMMUNITY)
Admission: RE | Admit: 2021-05-13 | Discharge: 2021-05-13 | Disposition: A | Discharge status: Home / Self Care | Payer: Medicare Other | Source: Ambulatory Visit | Attending: Gastroenterology | Admitting: Gastroenterology

## 2021-05-13 ENCOUNTER — Encounter (HOSPITAL_COMMUNITY): Payer: Self-pay

## 2021-05-13 DIAGNOSIS — R634 Abnormal weight loss: Secondary | ICD-10-CM | POA: Diagnosis present

## 2021-05-13 DIAGNOSIS — R6881 Early satiety: Secondary | ICD-10-CM | POA: Diagnosis present

## 2021-05-13 MED ORDER — IOHEXOL 350 MG/ML SOLN
100.0000 mL | Freq: Once | INTRAVENOUS | Status: AC | PRN
  Administered 2021-05-13: 100 mL via INTRAVENOUS

## 2021-05-13 NOTE — Telephone Encounter (Signed)
Pt called wanting to know is there a medication for pt's who have severe migraines while having a pacemaker. Wants something that will relieve her pain all day, pt is requesting a call back.

## 2021-05-13 NOTE — Telephone Encounter (Signed)
I called pt and she is asking for prescribed medication for morning headaches.  She has not had sleep study as requested when pt last seen (concern of pace maker and her administering the testing (causing issue with PM).  She states has used vinegar and tylenol now not working.   She questioned if she had allergy (to gnats)??  She has been in touch with cardiology (they would not prescribe anything), has call into pcp.  I reiterated about getting the SS done to r/u out OSAm contact cardiology or pcp whomever ordered.  She kept asking about medication.  Dr. Jaynee Eagles last saw 11-2020, no f/u made.  I relayed would be glad to relay to her for her input and get back with her next week as she is out of office.  She said ok.

## 2021-05-14 ENCOUNTER — Telehealth: Payer: Self-pay | Admitting: Allergy & Immunology

## 2021-05-14 NOTE — Telephone Encounter (Signed)
Patient has rhinitis and needs some type of medication to give her relief. Patient is using Neilmed temporarily but that is not helping much. In her last chart it mentions using Allegra one tablet daily, but patient does not remember this.  If she doesn't answer home phone call husband's cell phone, [phone].   Uses [facility] on [address]   Please advise.

## 2021-05-14 NOTE — Telephone Encounter (Signed)
Please advise 

## 2021-05-15 NOTE — Telephone Encounter (Signed)
Left message informing patient to call the office in regards to Chrissie's message.

## 2021-05-15 NOTE — Telephone Encounter (Signed)
Please make sure that she knows not to take both Allegra and cetirizine. Thank you!

## 2021-05-15 NOTE — Telephone Encounter (Signed)
According to Dr. Gillermina Hu note he recommended trying Allegra once a a day. I would start at Allegra 60 mg once a day as needed for runny nose.

## 2021-05-15 NOTE — Telephone Encounter (Signed)
Informed patient of the recommendation of taking Allegra once a day for runny nose.  Patient states she's been having these headaches and she went to Urgent Care in [place] and they prescribed her Cetrizine 5 mg.

## 2021-05-18 MED ORDER — FEXOFENADINE HCL 60 MG PO TABS: 60.0000 mg | ORAL_TABLET | Freq: Every day | ORAL | 5 refills | Status: DC

## 2021-05-18 NOTE — Telephone Encounter (Signed)
Patient called back to discuss the message from [place]. I relayed the message and patient verbalized understanding and agreed to start Allegra 60 mg daily.

## 2021-05-18 NOTE — Telephone Encounter (Signed)
Message sent over to sleep coordinator to review and see what happened with the order.  Will update.

## 2021-05-18 NOTE — Addendum Note (Signed)
Addended by: Clovis Cao A on: 05/18/2021 10:32 AM   Modules accepted: Orders

## 2021-05-19 NOTE — Telephone Encounter (Signed)
Called left message for patient.  Dr. Jaynee Eagles reviewed chart.  She has been in touch with Dr. Davina Poke regarding sleep evaluation and your morning headaches.  Dr. Jaynee Eagles would like that to be evaluated first as discussed at your last visit and of course if still issues then to let us know.  Please call back if you have any questions.

## 2021-05-28 NOTE — Telephone Encounter (Signed)
Pt returned call. Please call back.

## 2021-05-28 NOTE — Telephone Encounter (Signed)
Pt stated she was very disappointed in her care. Pt stated he headaches are in her frontal lobe. Pt stated headaches last all day long with no relief. Pt stated went to Urgent care and they gave her a prescription for Cetirizine 5mg  daily still no relief. Informed patient that I will send send the work doctor her request for something stronger for hr headache. Highly suggested to Patient if pain is unbearably to call 911 or go to the ED. Pt stated she really didn't want to go because she already had 2 ED bills and she did not want to get another. I stated again to patient if her headaches are that bad she needed to go to the emergency room and get seen. I also informed patient again will send Work in provider the request  for something stronger Per patient request. Pt stated she will think about going to ED tomorrow.

## 2021-05-28 NOTE — Telephone Encounter (Signed)
Dr. Krista Blue aware advised to let Dr.Ahern address on Monday.

## 2021-05-29 ENCOUNTER — Telehealth: Payer: Self-pay | Admitting: Internal Medicine

## 2021-05-29 NOTE — Telephone Encounter (Signed)
Called patient, she reports she has been having headaches for "a while" and recently was told by her allergist to take Theba. She wants to know if this is safe to take with her medications. Pt reports she does not currently have a headache. She reports her blood pressures have been in the "90s/60s" which she reports is typical for her. She denies weakness, facial droop, chest pain, or shortness of breath. She reports she feels ok today but wants to make sure she can take Allegra when she gets headaches. Advised pt her message would be forwarded to the pharmacy team for review. Pt verbalized understanding.

## 2021-05-29 NOTE — Telephone Encounter (Signed)
Pt c/o medication issue:  1. Name of Medication: allegra 60mg    2. How are you currently taking this medication (dosage and times per day)? Once a day   3. Are you having a reaction (difficulty breathing--STAT)? No   4. What is your medication issue? Pt has really bad sinus headaches and would like to know if she is able to take allegra 60mg  along with her other medications... please advise

## 2021-05-29 NOTE — Telephone Encounter (Signed)
Called patient, relayed the following from Dr. Audie Box:  Audie Box, Cassie Freer, MD to [place], [place] Triage     1:55 PM No issues with allegra. OK to take.   [name]. Audie Box, MD, [place]  [address], [place]  [place], [place] [address]  [phone]  1:55 PM   Pt verbalized understanding. All questions/concerns addressed at this time.

## 2021-06-01 NOTE — Telephone Encounter (Signed)
Patient called back- she stated that she did pick up the equipment to do the home sleep study, but once she got it home she became very anxious about using it with her pacemaker- she states that she decided against using the equipment and took it back the next day. After discussing with her the need to do the sleep study she would like to try an in lab study instead of being at home. I advised I would reach out to Dr.O'Neal to get this approval for the in lab, and would get it ordered.  I will reach out to our sleep coordinator to let her know once the in lab study is ordered.   Thanks!

## 2021-06-01 NOTE — Telephone Encounter (Signed)
I contacted patient to ask her about her home sleep study. Advised patient to call back to discuss further. Left call back number.

## 2021-06-02 NOTE — Telephone Encounter (Signed)
I spoke with sleep coordinator- in lab study does not need a PA, so we can get her scheduled.  You okay to order in lab study?

## 2021-06-04 ENCOUNTER — Other Ambulatory Visit: Payer: Self-pay

## 2021-06-04 ENCOUNTER — Telehealth: Payer: Self-pay | Admitting: *Deleted

## 2021-06-04 ENCOUNTER — Other Ambulatory Visit: Payer: Self-pay | Admitting: Allergy & Immunology

## 2021-06-04 DIAGNOSIS — R0683 Snoring: Secondary | ICD-10-CM

## 2021-06-04 DIAGNOSIS — R5383 Other fatigue: Secondary | ICD-10-CM

## 2021-06-04 NOTE — Telephone Encounter (Signed)
-----   Message from Caprice Beaver, LPN sent at 8/33/5825 11:14 AM EDT ----- Geronimo Boot, I went ahead and ordered the in lab sleep study for this patient. Can we get her set up.   Thank you!    CC'd Dr.O'Neal to make him aware I ordered this, we had ordered the home sleep study- so just went ahead so patient did not have to wait.   Thanks!

## 2021-06-04 NOTE — Telephone Encounter (Signed)
Dr. Gallagher please advise.  

## 2021-06-04 NOTE — Progress Notes (Signed)
Remote pacemaker transmission.   

## 2021-06-04 NOTE — Telephone Encounter (Signed)
Patient notified of in lab sleep study appointment at [facility] scheduled for 06/26/21.

## 2021-06-04 NOTE — Telephone Encounter (Signed)
Patient called stating she is still having very very very bad sinus headaches with rhinitis every single day. Patient states she can't take anymore, the headaches are causing her to feel like she is losing her mind. Patient states her neurologist wants her to do a sleep study. She is pending being scheduled for the sleep study.     Please Advise   [facility] on [address]

## 2021-06-05 ENCOUNTER — Other Ambulatory Visit: Payer: Self-pay | Admitting: *Deleted

## 2021-06-05 NOTE — Telephone Encounter (Signed)
We can start her on a low-dose prednisone burst to see if this helps.  Lets do 10 mg twice daily for 7 days followed by 10 mg daily for 7 days.  Please ask her to take omeprazole 20 mg daily during this time.  Salvatore Marvel, MD [facility] [place] [facility] [place]

## 2021-06-05 NOTE — Telephone Encounter (Signed)
I called and spoke with the patient and she states that she does not want to take the Prednisone because one of our on call physician's sent it in before and it didn't help and it gave her a UTI. She states that a friend of hers has Migraines and they take Nortriptyline and it helps and she wanted me to let you know that. I did inform her that you do not typically prescribe those types of medications. I advised to her about the Omeprazole but when I went to put in a prescription for her it is flagged in her chart that it gives her seizures.

## 2021-06-08 ENCOUNTER — Other Ambulatory Visit: Payer: Self-pay | Admitting: *Deleted

## 2021-06-11 MED ORDER — GABAPENTIN 300 MG PO CAPS: 300.0000 mg | ORAL_CAPSULE | Freq: Two times a day (BID) | ORAL | 5 refills | Status: DC | PRN

## 2021-06-11 NOTE — Telephone Encounter (Signed)
Called and spoke to patient and her husband and informed them of Dr. Gillermina Hu message.Both verbalized understanding and agreed to use the gabapentin. I also sent them a My Chart message with the note from Dr. Ernst Bowler per their request. Gabapentin has been sent in.

## 2021-06-11 NOTE — Telephone Encounter (Signed)
I have never prescribed nortriptyline, so I would probably ask her primary care doctor.  I have used gabapentin for itching.  We did start with 300 mg 1-2 times daily.  It can cause sleepiness, so warned her about that.  Salvatore Marvel, MD [facility] [place] [facility] [place]

## 2021-06-11 NOTE — Addendum Note (Signed)
Addended by: Clovis Cao A on: 06/11/2021 12:09 PM   Modules accepted: Orders

## 2021-06-22 ENCOUNTER — Telehealth: Payer: Self-pay | Admitting: Gastroenterology

## 2021-06-22 NOTE — Telephone Encounter (Signed)
Tammy took care of it ?

## 2021-06-22 NOTE — Telephone Encounter (Signed)
Dena or Tammy:  Can we provide Creon to patient? It is weight-based, so I am starting off slow due to her lower weight.  Please leave Creon samples for patient with instructions to take 1 capsule while eating meals three times a day. She may take 1 capsule with a snack. We have room to increase this dosage if we need to do so.

## 2021-06-22 NOTE — Telephone Encounter (Signed)
Spoke with pt and informed her that Creon samples would be at the desk for pickup with instructions in the bag. Pt verbalized understanding and stated that she would pick up on 06/23/21.

## 2021-06-23 ENCOUNTER — Telehealth: Payer: Self-pay | Admitting: Internal Medicine

## 2021-06-23 NOTE — Telephone Encounter (Signed)
New message  Pt called asking about if she could drink decaf coffee and tea.

## 2021-06-24 NOTE — Telephone Encounter (Signed)
Let her know that less caffeine is better when it comes to her atrial fibrillation. If she starts having increased heart rates then she could be drinking to much.  Verbalized understanding.

## 2021-06-26 ENCOUNTER — Telehealth: Payer: Self-pay | Admitting: Internal Medicine

## 2021-06-26 ENCOUNTER — Ambulatory Visit: Payer: Medicare Other | Attending: Cardiovascular Disease | Admitting: Cardiovascular Disease

## 2021-06-26 ENCOUNTER — Other Ambulatory Visit: Payer: Self-pay

## 2021-06-26 DIAGNOSIS — R0683 Snoring: Secondary | ICD-10-CM

## 2021-06-26 DIAGNOSIS — Z79899 Other long term (current) drug therapy: Secondary | ICD-10-CM | POA: Diagnosis not present

## 2021-06-26 DIAGNOSIS — G4734 Idiopathic sleep related nonobstructive alveolar hypoventilation: Secondary | ICD-10-CM

## 2021-06-26 DIAGNOSIS — R5383 Other fatigue: Secondary | ICD-10-CM

## 2021-06-26 DIAGNOSIS — R0902 Hypoxemia: Secondary | ICD-10-CM | POA: Diagnosis not present

## 2021-06-26 NOTE — Telephone Encounter (Signed)
Pt said she spoke with Aviva Kluver yesterday and was told to call the office and speak with the nurse regarding her medicine. [phone]

## 2021-06-26 NOTE — Telephone Encounter (Signed)
Carol Hardin, The pt states she is taking the Creon and seems to be tolerating it pretty well but she complains her abdomen is hard and tight. Also states she feels like her abd is large. She wants to know what/why its doing that? Her next question is should she take Colace if she doesn't have a bowel movement? The pt went to [facility] and was put on Myrbetrig (mirabegron) ER 50 mg with the instructions to take once a day. Her question is Can she take this new medication with Creon? She has already asked her urologist about this and they didn't see a problem with it but she wants your opinion. I advised the pt we close early today and if I don't get back with her today that it will be ok for you to call her.

## 2021-06-29 ENCOUNTER — Ambulatory Visit: Payer: Medicare Other | Admitting: Urology

## 2021-06-29 NOTE — Telephone Encounter (Signed)
Spoke with patient today.  She reports that her abdomen became larger than normal, tight, and bloated 2 days ago.  Seems to ease off when she lays down, but still feels somewhat bloated and tight today.  Since starting Creon, she felt more on the constipated side.  She stopped taking Creon yesterday.  Feels that her soft stools are coming back.  Her last bowel movement was yesterday.  Passed gas last night.  Denies nausea, vomiting, or abdominal pain.  Denies peripheral edema or shortness of breath.   Discussed DG abdomen today to evaluate for constipation, developing ileus or SBO. She declined. Prefers to monitor for a couple days and wait for Carol Hardin's return. She is continuing to hold Creon for now as well until speaking with Carol Hardin.  Advised that she call us back if she has any worsening symptoms or proceed to the emergency room if symptoms are severe.

## 2021-06-30 NOTE — Telephone Encounter (Signed)
Spoke with patient. Discussed symptoms. She notes early satiety, regurgitation of food at times. Would benefit from EGD. Looser stool is intermittent. Seems to have more upper GI predominant components. Will need to review with anesthesia regarding cardiac history.

## 2021-07-12 NOTE — Procedures (Signed)
[facility]        Patient Name: Carol Hardin, Carol Hardin Date: 06/26/2021 Gender: Female D.O.B: 1933-03-02 Age (years): 89 Referring Provider: Cassie Freer ONeal Height (inches): 64 Interpreting Physician: Shelva Majestic MD, ABSM Weight (lbs): 122 RPSGT: Peak, Robert BMI: 21 MRN: [id number] Neck Size: 13.00  CLINICAL INFORMATION Sleep Study Type: NPSG  Indication for sleep study: Fatigue, Snoring  Epworth Sleepiness Score: 3  SLEEP STUDY TECHNIQUE As per the AASM Manual for the Scoring of Sleep and Associated Events v2.3 (April 2016) with a hypopnea requiring 4% desaturations.  The channels recorded and monitored were frontal, central and occipital EEG, electrooculogram (EOG), submentalis EMG (chin), nasal and oral airflow, thoracic and abdominal wall motion, anterior tibialis EMG, snore microphone, electrocardiogram, and pulse oximetry.  MEDICATIONS acetaminophen (TYLENOL) 325 MG tablet bismuth subsalicylate (PEPTO BISMOL) 262 MG/15ML suspension Calcium Carbonate Antacid (TUMS PO) carvedilol (COREG) 3.125 MG tablet (Expired) digoxin (LANOXIN) 0.125 MG tablet docusate sodium (COLACE) 100 MG capsule ELIQUIS 2.5 MG TABS tablet Ensure (ENSURE) fexofenadine (ALLEGRA) 60 MG tablet furosemide (LASIX) 20 MG tablet gabapentin (NEURONTIN) 300 MG capsule ipratropium (ATROVENT) 0.06 % nasal spray lipase/protease/amylase (CREON) 12000-38000 units CPEP capsule lisinopril (ZESTRIL) 2.5 MG tablet LORazepam (ATIVAN) 1 MG tablet nitrofurantoin, macrocrystal-monohydrate, (MACROBID) 100 MG capsule Polyethyl Glycol-Propyl Glycol (SYSTANE OP) Probiotic Product (ALIGN PO)  Medications self-administered by patient taken the night of the study : N/A  SLEEP ARCHITECTURE The study was initiated at 9:46:25 PM and ended at 4:56:28 AM.  Sleep onset time was 69.9 minutes and the sleep efficiency was 59.2%. The total sleep time was 254.5 minutes.  Stage REM  latency was N/A minutes.  The patient spent 9.43% of the night in stage N1 sleep, 90.57% in stage N2 sleep, 0.00% in stage N3 and 0% in REM.  Alpha intrusion was absent.  Supine sleep was 24.75%.  RESPIRATORY PARAMETERS The overall apnea/hypopnea index (AHI) was 1.9 per hour. The respiratory disturbance index (RDI) was  2.1/h.There were 2 total apneas, including 2 obstructive, 0 central and 0 mixed apneas. There were 6 hypopneas and 1 RERAs.  The AHI during Stage REM sleep was N/A per hour.  AHI while supine was 7.6 per hour.  The mean oxygen saturation was 89.80%. The minimum SpO2 during sleep was 82.00%.  Soft snoring was noted during this study.  CARDIAC DATA The 2 lead EKG demonstrated pacemaker generated. The mean heart rate was 72.87 beats per minute. Other EKG findings include: paced rhythm, PVCs.  LEG MOVEMENT DATA The total PLMS were 80 with a resulting PLMS index of 18.86. Associated arousal with leg movement index was 0.2 .  IMPRESSIONS - No significant obstructive sleep apnea overall (AHI 1.9/h; RDI 2.1/h): however, mild sleep apnea with supine sleep (AHI 7.6/h) and REM sleep was absent. - Moderate oxygen desaturation to a nadir of 82.00%. - The patient snored with soft snoring volume. - Abnormal sleep architecture with absent slow wave and REM sleep. - Reduced sleep efficiency at 59.2% and wake after sleep onset (WASO) was 105.7 minutes/ - EKG findings include a paced rhythm with PVCs. - Mild periodic limb movements of sleep occurred during the study. No significant associated arousals.  DIAGNOSIS - Nocturnal Hypoxemia (G47.36) - Mild snoring  RECOMMENDATIONS - At present, there is no indication for CPAP therapy. - Effort should be made to  optimize nasal and oropharyngeal patency.  - Patient should be counseled to avoid supine sleep.  - Avoid alcohol, sedatives and other CNS depressants that may worsen sleep apnea and disrupt normal sleep architecture.  - Sleep  hygiene should be reviewed to assess factors that may improve sleep quality. - Weight management and regular exercise should be initiated or continued if appropriate.  [Electronically signed] 07/12/2021 10:04 AM  Shelva Majestic MD, [facility], [place], American Board of Sleep Medicine   NPI: [id number] [place] PH: [phone]   FX: [phone] [place]

## 2021-07-13 ENCOUNTER — Telehealth: Payer: Self-pay | Admitting: *Deleted

## 2021-07-13 NOTE — Telephone Encounter (Signed)
Patient returned a call to me and was given sleep study results. Patient voiced understanding. She brought to my attention that her gender and weight is recorded incorrectly on the report. She also informed me some of her medications are also incorrect. Vernon with the sleep lab will be notified of these discrepancies and hopefully can get them corrected.

## 2021-07-13 NOTE — Telephone Encounter (Signed)
-----   Message from Troy Sine, MD sent at 07/12/2021 10:09 AM EDT ----- Mariann Laster, please notify pt of results

## 2021-07-13 NOTE — Telephone Encounter (Signed)
Left message to return a call to discuss sleep study results and recommendations. 

## 2021-07-15 ENCOUNTER — Telehealth: Payer: Self-pay | Admitting: Cardiovascular Disease

## 2021-07-15 NOTE — Telephone Encounter (Signed)
Spoke to patient Pharm D's advice given. 

## 2021-07-15 NOTE — Telephone Encounter (Signed)
Pt c/o medication issue:  1. Name of Medication: Myrbetriq 50 mg   2. How are you currently taking this medication (dosage and times per day)? Once daily   3. Are you having a reaction (difficulty breathing--STAT)?   4. What is your medication issue? Patient accidentally took two pills   Patient got a sample of this medication from Dr. McDermot at [facility] for urinary incontinence. She accidentally took two pills instead of one. She was told to contact her Cardiologist because taking extra medication may cause Cardiac side

## 2021-07-15 NOTE — Telephone Encounter (Signed)
Spoke to patient she stated she took Myrbetriq 50 mg 2 tablets this morning instead of 1 tablet.She was told to call and let Dr.O'Neal know it could cause palpitations.Stated she feels ok at present. Advised Dr.O'Neal is out of office.I will send message to our pharmacy team.

## 2021-07-15 NOTE — Telephone Encounter (Signed)
Just have patient monitor. She can skip her dose tomorrow and then resume on Friday.

## 2021-07-16 ENCOUNTER — Telehealth: Payer: Self-pay | Admitting: Neurology

## 2021-07-16 ENCOUNTER — Other Ambulatory Visit: Payer: Self-pay | Admitting: Neurology

## 2021-07-16 MED ORDER — TOPIRAMATE 25 MG PO TABS: 25.0000 mg | ORAL_TABLET | Freq: Every evening | ORAL | 3 refills | Status: DC

## 2021-07-16 NOTE — Telephone Encounter (Signed)
Spoke with patient and discussed the message from Dr. Lavell Anchors.  Patient aware Topamax is being prescribed 25 mg.  Per Dr. Jaynee Eagles, she can take this every evening with dinner.  Patient's questions were answered and she is aware of potential common side effects including but not limited to dizziness, nervousness, drowsiness, tiredness, numbness or tingling.  Patient was scheduled for an 8-week follow-up with Janett Billow NP in December.  Patient had told Dr. Lavell Anchors she took her self off the oxygen.  This I clarified with the patient and she said she felt she had been on it since last year and she is on it every night but she does not recall when she started it back.  She will look at this information and call us with an update.

## 2021-07-16 NOTE — Telephone Encounter (Signed)
Spoke with pt. I let her know we do have the results and I will give them to Dr Jaynee Eagles asap to review. Pt reported the gender and the medications were wrong on her sleep study but they will be fixing this. She stated the actual sleep study results were correct. Pt states medication for headaches was depending on the sleep study. She is still suffering from the headaches and I advised her in the meantime she may need to go to the urgent care or ER if she cannot wait or if the headaches get worse.

## 2021-07-16 NOTE — Telephone Encounter (Signed)
Pt called, want to know if physician has received a copy of sleep study results. Would like a call from the nurse

## 2021-07-17 NOTE — Telephone Encounter (Signed)
Pt called stating that she started Oxygen 01-12-19. She also states that on 1-21 she received a letter stating that she needed to be recertified so she went and saw Dr. [name]. She had to wear some equipment that ended up showing that she did need to continue wearing the oxygen at night.

## 2021-07-20 NOTE — Telephone Encounter (Signed)
Thank you so much for the additional information. Relayed to Dr Jaynee Eagles.

## 2021-07-24 ENCOUNTER — Telehealth: Payer: Self-pay | Admitting: Neurology

## 2021-07-24 DIAGNOSIS — R519 Headache, unspecified: Secondary | ICD-10-CM

## 2021-07-24 MED ORDER — BUTALBITAL-APAP-CAFFEINE 50-325-40 MG PO TABS: 1.0000 | ORAL_TABLET | Freq: Four times a day (QID) | ORAL | 0 refills | Status: DC | PRN

## 2021-07-24 NOTE — Telephone Encounter (Signed)
87 years old Patient called on call physician for continued headache, was seen by Dr. Jaynee Eagles since Feb 2022, multiple phone calls, follow up on Dec 14th 2022.  Please move up her follow up appt.  She is crying, constant headaches, she does not function well because of the headache  ERx Fioricet 10 tablets,

## 2021-07-27 NOTE — Telephone Encounter (Signed)
I called pt and made appt for her 07-29-21 at 0830 with MM/NP to evaluate earlier appt.  She has appt with 09-2021, that can be cancelled with Janett Billow NP whom she has not seen.  I did not cancel.  Pt uses her oxygen, she states it has not helped her headaches.  She is using topiramate 25mg  po qhs, and the fiorcet prn. I relayed to check in latest 0815. (She is coming from [place]).  She verbalized understanding.

## 2021-07-27 NOTE — Telephone Encounter (Signed)
If seeing Jinny Blossom, NP this week, December appointment can be canceled. Thank you

## 2021-07-29 ENCOUNTER — Encounter: Payer: Self-pay | Admitting: Adult Health

## 2021-07-29 ENCOUNTER — Ambulatory Visit: Payer: Medicare Other | Admitting: Adult Health

## 2021-07-29 VITALS — BP 100/66 | HR 70 | Ht 64.0 in | Wt 117.2 lb

## 2021-07-29 DIAGNOSIS — G4734 Idiopathic sleep related nonobstructive alveolar hypoventilation: Secondary | ICD-10-CM

## 2021-07-29 DIAGNOSIS — R519 Headache, unspecified: Secondary | ICD-10-CM

## 2021-07-29 MED ORDER — TOPIRAMATE 25 MG PO TABS: 50.0000 mg | ORAL_TABLET | Freq: Every evening | ORAL | 3 refills | Status: DC

## 2021-07-29 NOTE — Patient Instructions (Signed)
Your Plan:  Increase Topamax 50 mg at bedtime Ok to use Fioricet for headache- not to be taken daily Overnight pulse oximetry test-- use your oxygen when testing If your symptoms worsen or you develop new symptoms please let us know.    Thank you for coming to see [place] at [facility]. I hope we have been able to provide you high quality care today.  You may receive a patient satisfaction survey over the next few weeks. We would appreciate your feedback and comments so that we may continue to improve ourselves and the health of our patients.

## 2021-07-29 NOTE — Progress Notes (Signed)
Order for pulse ox faxed to [employer]  [phone] (received).

## 2021-07-29 NOTE — Telephone Encounter (Signed)
I cancelled the 09/2021 appt as seen earlier with MM/NP and will be seen in 6 months.

## 2021-07-29 NOTE — Progress Notes (Signed)
PATIENT: Carol Hardin DOB: 07-26-1933  REASON FOR VISIT: follow up HISTORY FROM: patient   HISTORY OF PRESENT ILLNESS: Today 07/29/21:  Carol Hardin is an 87 year old female with a history of daily morning headaches.  She returns today for follow-up.  She states that she has been taking Topamax 25 mg at bedtime with no benefit.  She states that she did try Fioricet and I did get rid of her headache.  She has not been taking it daily.  She states that the headache typically occurs above the right eye and extends to the scalp.  She reports on occasion she feels like the headache goes straight down the center of her head to the occipital region.  She denies nausea or vomiting.  Denies any changes with her vision.  She reports that she does wear 2 L of oxygen at night.  Recent sleep study did not show sleep apnea.  She returns today for an evaluation.  HISTORY (copied from Dr. Cathren Laine note) Carol Hardin is a 87 y.o. female here as requested by Pablo Lawrence, NP for headaches. Past medical history stroke, paroxysmal atrial fibrillation, osteoporosis, ventricular tachycardia, mitral regurg, migraines, depression, daily headache, cardiomyopathy, bradycardia, arthritis, anxiety, CKD, weight loss, anxiety, DOE.    When she was in [place] they were getting ready to move to [place] she rushed out and slipped and fell and fractured her hip March 2019. She went to PT and in April finally moved into her new home. At some point she was administered amiodarone and she got a pacemaker, she had multiple hospitalizations, she lost weight. The headaches started even back in PT. She can't take ibuprofen. Biofreeze on the forehead would help. Now she has them every day, wakes up with them every morning, sometimes she can get rid of them by using biofreeze. They are in the front of the head. They have been worsening. The headaches are bad all day long.It is in the forehead bilaterally. They are  severe and can last all day long. Worse in the morning. She is on oxygen overnight, she was having trouble breathing at night. She took herself off of the oxygen. No other focal neurologic deficits, associated symptoms, inciting events or modifiable factors.   I reviewed Pablo Lawrence' notes: Patient was seen in the emergency room in September 21 for a fall, at that time she fell in her kitchen and hit the back of her head, she had a little right finger pain, she had imaging and there were no acute intracranial processes, she had an obvious deformity of her right little finger, this was reduced by the emergency room physician, her finger was splinted and she was referred to Ortho, she reports headaches, the started prior to her fall when she hit her head on June 27, 2020, examination showed a white female in no acute distress, head normal, eyes normal, pupils equally round reactive to light, extraocular movements intact, disc margins sharp, no exudate noted, ears normal, TMs normal, nose normal, lungs and heart both normal.  Noted "other complicated headache syndrome", has had headaches worse in the morning for several months, she fell and hit her head in September 2021 no focal neuro deficits in the office, headaches are sporadic, may have postconcussive headaches but she had them prior to her fall, referred to neurology.  Patient is on Eliquis.  Blood work drawn November 02, 2019 include a CBC with MCV of 99 otherwise normal, CMP showed BUN 11 and creatinine 0.96 otherwise  unremarkable, hemoglobin A1c 5.3, vitamin D 25-hydroxy 39.4.   Reviewed notes, labs and imaging from outside physicians, which showed:   CT head 06/27/2020: personally reviewed imaging and agree with the following: FINDINGS: Brain: Chronic small vessel ischemic changes are seen throughout the periventricular white matter. No signs of acute infarct or hemorrhage. Lateral ventricles and midline structures are unremarkable. No acute  extra-axial fluid collections. No mass effect.   Vascular: No hyperdense vessel or unexpected calcification.   Skull: Normal. Negative for fracture or focal lesion.   Sinuses/Orbits: No acute finding.   Other: None.   IMPRESSION: 1. Stable head CT, no acute process.    REVIEW OF SYSTEMS: Out of a complete 14 system review of symptoms, the patient complains only of the following symptoms, and all other reviewed systems are negative.  ALLERGIES: Allergies  Allergen Reactions   Omeprazole Other (See Comments)    siezure   Flagyl [Metronidazole] Nausea And Vomiting   Amiodarone    Aripiprazole Other (See Comments)    Unknown reaction   Hylan G-F 20 Other (See Comments)    Unknown reaction   Metoprolol Tartrate    Paroxetine    Paroxetine Hcl Other (See Comments)    Headaches (a long time ago- pt doesn't really remember)   Statins Other (See Comments)    No specific reaction given-patient states that she was advised not to take by physician   Sulfa Antibiotics Diarrhea and Other (See Comments)    Colitis    Toprol Xl [Metoprolol Succinate] Other (See Comments)    Dizziness--pt doesn't really remember    Vancomycin Itching and Other (See Comments)    Pt reports that they gave it too fast- she started having itching in the scalp   Penicillins Rash    DID THE REACTION INVOLVE: Swelling of the face/tongue/throat, SOB, or low BP? No Sudden or severe rash/hives, skin peeling, or the inside of the mouth or nose? Yes Did it require medical treatment? Unknown When did it last happen?  Over 10 years     If all above answers are "NO", may proceed with cephalosporin use.     HOME MEDICATIONS: Outpatient Medications Prior to Visit  Medication Sig Dispense Refill   acetaminophen (TYLENOL) 325 MG tablet Take 325-975 mg by mouth daily.     bismuth subsalicylate (PEPTO BISMOL) 262 MG/15ML suspension Take 30 mLs by mouth as needed.     butalbital-acetaminophen-caffeine (FIORICET)  50-325-40 MG tablet Take 1 tablet by mouth every 6 (six) hours as needed for headache. 10 tablet 0   Calcium Carbonate Antacid (TUMS PO) Take by mouth as needed.     carvedilol (COREG) 3.125 MG tablet Take 1 tablet (3.125 mg total) by mouth 2 (two) times daily. 180 tablet 3   digoxin (LANOXIN) 0.125 MG tablet Take 0.0625 mg by mouth daily.     docusate sodium (COLACE) 100 MG capsule Take 100 mg by mouth daily as needed for mild constipation.     ELIQUIS 2.5 MG TABS tablet TAKE 1 TABLET(2.5 MG) BY MOUTH TWICE DAILY 60 tablet 10   Ensure (ENSURE) Take 237 mLs by mouth daily.      fexofenadine (ALLEGRA) 60 MG tablet Take 1 tablet (60 mg total) by mouth daily. 30 tablet 5   furosemide (LASIX) 20 MG tablet Take 20 mg by mouth as needed for fluid.     gabapentin (NEURONTIN) 300 MG capsule Take 1 capsule (300 mg total) by mouth 2 (two) times daily as needed (For itching). [place]  capsule 5   ipratropium (ATROVENT) 0.06 % nasal spray USE 1 SPRAY IN EACH NOSTRIL THREE TIMES DAILY 15 mL 5   lipase/protease/amylase (CREON) 12000-38000 units CPEP capsule Take 36,000 Units by mouth with breakfast, with lunch, and with evening meal.     lisinopril (ZESTRIL) 2.5 MG tablet TAKE 1 TABLET(2.5 MG) BY MOUTH DAILY 90 tablet 0   LORazepam (ATIVAN) 1 MG tablet Take 1 mg by mouth at bedtime.     nitrofurantoin, macrocrystal-monohydrate, (MACROBID) 100 MG capsule Take 1 capsule (100 mg total) by mouth 2 (two) times daily. 10 capsule 0   Polyethyl Glycol-Propyl Glycol (SYSTANE OP) Apply to eye as needed.     Probiotic Product (ALIGN PO) Take by mouth daily.     topiramate (TOPAMAX) 25 MG tablet Take 1 tablet (25 mg total) by mouth at bedtime. 30 tablet 3   No facility-administered medications prior to visit.    PAST MEDICAL HISTORY: Past Medical History:  Diagnosis Date   Anxiety    Arthritis    "fingers, right toe" (08/09/2016)   Atrial fibrillation (HCC)    Basal cell carcinoma of lower leg, right    Bradycardia     a. Holter 08/30/16 showed profound bradycardia down to 30 during awake hours, several 2 second pauses, very frequent PVCs >8000 in 48 hours, and NSVT (longest of 14 beats).   Cardiomyopathy ([place])    a. EF 40% by echo 08/2016.   Daily headache    "I usually wake up w/a headache; sometimes it's a migraine" (08/09/2016)   Depression    Diverticulosis    Hx. of   Frequent PVCs    GERD (gastroesophageal reflux disease)    Hypercholesterolemia    Migraine    Mitral regurgitation    MVP (mitral valve prolapse)    NSVT (nonsustained ventricular tachycardia) ([place])    a. first noted event monitor 08/2016.   Osteoporosis    PAF (paroxysmal atrial fibrillation) ([place])    a. in afib at time of echo 08/2016.   Pneumonia    Scoliosis    mild   Squamous cell carcinoma of neck    Stroke ([place]) 05/2018    PAST SURGICAL HISTORY: Past Surgical History:  Procedure Laterality Date   AV NODE ABLATION N/A 01/23/2019   Procedure: AV NODE ABLATION;  Surgeon: Thompson Grayer, MD;  Location: [facility];  Service: Cardiovascular;  Laterality: N/A;   BASAL CELL CARCINOMA EXCISION Right 1980s   RLE   BIV PACEMAKER INSERTION CRT-P N/A 01/23/2019   Procedure: BIV PACEMAKER INSERTION CRT-P;  Surgeon: Thompson Grayer, MD;  Location: [facility];  Service: Cardiovascular;  Laterality: N/A;   BLEPHAROPLASTY     BREAST BIOPSY Left 1970s   BREAST CYST ASPIRATION Left 1970s   BREAST CYST EXCISION Left 1970s   BUNIONECTOMY Bilateral    DILATION AND CURETTAGE OF UTERUS     EXCISIONAL HEMORRHOIDECTOMY  1970s   INTRAMEDULLARY (IM) NAIL INTERTROCHANTERIC Left 12/31/2017   Procedure: INTRAMEDULLARY (IM) NAIL INTERTROCHANTRIC;  Surgeon: Nicholes Stairs, MD;  Location: [facility];  Service: Orthopedics;  Laterality: Left;   KNEE ARTHROSCOPY Right 04/12/2007   KNEE ARTHROSCOPY Left 1998   SQUAMOUS CELL CARCINOMA EXCISION     "neck"   TONSILLECTOMY      FAMILY HISTORY: Family History  Problem Relation  Age of Onset   Heart failure Mother    Leukemia Mother    Heart failure Father    COPD Father    Alzheimer's disease  Brother    Heart Problems Brother        stent   Heart Problems Son        stent    Allergic rhinitis Neg Hx    Angioedema Neg Hx    Asthma Neg Hx    Eczema Neg Hx    Atopy Neg Hx    Immunodeficiency Neg Hx    Urticaria Neg Hx    Headache Neg Hx     SOCIAL HISTORY: Social History   Socioeconomic History   Marital status: Married    Spouse name: Not on file   Number of children: 4   Years of education: 2.5 yrs of college   Highest education level: Not on file  Occupational History   Not on file  Tobacco Use   Smoking status: Former    Packs/day: 1.00    Years: 20.00    Pack years: 20.00    Types: Cigarettes    Quit date: 1989    Years since quitting: 33.8   Smokeless tobacco: Never  Vaping Use   Vaping Use: Never used  Substance and Sexual Activity   Alcohol use: No    Comment: quit 1995, was occasional    Drug use: Never   Sexual activity: Not Currently  Other Topics Concern   Not on file  Social History Narrative   Lives at home with spouse   Caffeine: 2 cups coffee/day   Social Determinants of Health   Financial Resource Strain: Not on file  Food Insecurity: Not on file  Transportation Needs: Not on file  Physical Activity: Not on file  Stress: Not on file  Social Connections: Not on file  Intimate Partner Violence: Not on file      PHYSICAL EXAM  Vitals:   07/29/21 0833  BP: 100/66  Pulse: 70  Weight: 117 lb 3.2 oz (53.2 kg)  Height: 5\' 4"  (1.626 m)   Body mass index is 20.12 kg/m.  Generalized: Well developed, in no acute distress   Neurological examination  Mentation: Alert oriented to time, place, history taking. Follows all commands speech and language fluent Cranial nerve II-XII: Pupils were equal round reactive to light. Extraocular movements were full, visual field were full on confrontational test. Facial  sensation and strength were normal. Uvula tongue midline. Head turning and shoulder shrug  were normal and symmetric. Motor: The motor testing reveals 5 over 5 strength of all 4 extremities. Good symmetric motor tone is noted throughout.  Sensory: Sensory testing is intact to soft touch on all 4 extremities. No evidence of extinction is noted.  Coordination: Cerebellar testing reveals good finger-nose-finger and heel-to-shin bilaterally.  Gait and station: Gait is normal. Tandem gait is normal. Romberg is negative. No drift is seen.  Reflexes: Deep tendon reflexes are symmetric and normal bilaterally.   DIAGNOSTIC DATA (LABS, IMAGING, TESTING) - I reviewed patient records, labs, notes, testing and imaging myself where available.  Lab Results  Component Value Date   WBC 7.5 05/07/2021   HGB 12.4 05/07/2021   HCT 36.8 05/07/2021   MCV 98 (H) 05/07/2021   PLT 171 05/07/2021      Component Value Date/Time   NA 142 05/07/2021 1612   K 4.7 05/07/2021 1612   CL 105 05/07/2021 1612   CO2 23 05/07/2021 1612   GLUCOSE 83 05/07/2021 1612   GLUCOSE 104 (H) 02/16/2021 2149   BUN 12 05/07/2021 1612   CREATININE 0.97 05/07/2021 1612   CREATININE 0.93 (H) 09/22/2016 7425  CALCIUM 9.3 05/07/2021 1612   PROT 8.0 02/16/2021 2149   PROT 7.3 04/23/2020 1222   ALBUMIN 4.2 02/16/2021 2149   ALBUMIN 4.1 04/23/2020 1222   AST 35 02/16/2021 2149   ALT 19 02/16/2021 2149   ALKPHOS 46 02/16/2021 2149   BILITOT 1.0 02/16/2021 2149   BILITOT 0.6 04/23/2020 1222   GFRNONAA >60 02/16/2021 2149   GFRAA 68 08/11/2020 0000   Lab Results  Component Value Date   CHOL 134 08/11/2020   HDL 41 08/11/2020   LDLCALC 79 08/11/2020   TRIG 67 08/11/2020   CHOLHDL 3.3 01/23/2019   Lab Results  Component Value Date   HGBA1C 5.7 08/11/2020   No results found for: VITAMINB12 Lab Results  Component Value Date   TSH 3.66 08/11/2020      ASSESSMENT AND PLAN 87 y.o. year old female  has a past medical  history of Anxiety, Arthritis, Atrial fibrillation ([place]), Basal cell carcinoma of lower leg, right, Bradycardia, Cardiomyopathy ([place]), Daily headache, Depression, Diverticulosis, Frequent PVCs, GERD (gastroesophageal reflux disease), Hypercholesterolemia, Migraine, Mitral regurgitation, MVP (mitral valve prolapse), NSVT (nonsustained ventricular tachycardia) ([place]), Osteoporosis, PAF (paroxysmal atrial fibrillation) ([place]), Pneumonia, Scoliosis, Squamous cell carcinoma of neck, and Stroke ([place]) (05/2018). here with:  1.  Daily morning headache  Overnight pulse oximetry test ordered.  Patient advised to wear her oxygen when she takes this test. Increase Topamax to 50 mg at bedtime Okay to continue Fioricet but advised that should not be taken daily as it will cause rebound headaches Follow-up in 6 months or sooner if needed     Ward Givens, MSN, NP-C 07/29/2021, 8:30 AM [facility] [address] [place], [place] [address] [phone]

## 2021-07-31 ENCOUNTER — Ambulatory Visit: Payer: Medicare Other | Admitting: Family

## 2021-08-04 ENCOUNTER — Telehealth: Payer: Self-pay | Admitting: Adult Health

## 2021-08-04 NOTE — Telephone Encounter (Signed)
I called pt back.  She continues with headache.  Topamax 50mg  daily.  Due to pick up the pulse oximetry machine tomorrow and will use with her oxygen tomorrow night (per [employer]).  Will wait on increasing the topiramate to 75mg  po after dinner. She appreicated call back.

## 2021-08-04 NOTE — Telephone Encounter (Signed)
Pt called states the topiramate (TOPAMAX) 25 MG tablet is not helping at all and asking for something else. Pt requesting a call back.

## 2021-08-10 ENCOUNTER — Encounter: Payer: Self-pay | Admitting: Cardiovascular Disease

## 2021-08-10 ENCOUNTER — Ambulatory Visit: Payer: Medicare Other | Admitting: Cardiovascular Disease

## 2021-08-10 ENCOUNTER — Ambulatory Visit (INDEPENDENT_AMBULATORY_CARE_PROVIDER_SITE_OTHER): Payer: Medicare Other

## 2021-08-10 ENCOUNTER — Other Ambulatory Visit: Payer: Self-pay

## 2021-08-10 VITALS — BP 100/76 | HR 60 | Ht 64.0 in | Wt 117.0 lb

## 2021-08-10 DIAGNOSIS — I442 Atrioventricular block, complete: Secondary | ICD-10-CM

## 2021-08-10 DIAGNOSIS — I4821 Permanent atrial fibrillation: Secondary | ICD-10-CM | POA: Diagnosis not present

## 2021-08-10 DIAGNOSIS — I428 Other cardiomyopathies: Secondary | ICD-10-CM | POA: Diagnosis not present

## 2021-08-10 DIAGNOSIS — Z950 Presence of cardiac pacemaker: Secondary | ICD-10-CM

## 2021-08-10 DIAGNOSIS — I5022 Chronic systolic (congestive) heart failure: Secondary | ICD-10-CM

## 2021-08-10 LAB — CUP PACEART REMOTE DEVICE CHECK
Battery Remaining Longevity: 53 mo
Battery Remaining Percentage: 61 %
Battery Voltage: 2.99 V
Date Time Interrogation Session: 20221031020008
Implantable Lead Implant Date: 20200414
Implantable Lead Implant Date: 20200414
Implantable Lead Implant Date: 20200414
Implantable Lead Location: 753858
Implantable Lead Location: 753859
Implantable Lead Location: 753860
Implantable Pulse Generator Implant Date: 20200414
Lead Channel Impedance Value: 440 Ohm
Lead Channel Impedance Value: 700 Ohm
Lead Channel Pacing Threshold Amplitude: 0.5 V
Lead Channel Pacing Threshold Amplitude: 1 V
Lead Channel Pacing Threshold Pulse Width: 0.5 ms
Lead Channel Pacing Threshold Pulse Width: 0.7 ms
Lead Channel Sensing Intrinsic Amplitude: 10.5 mV
Lead Channel Setting Pacing Amplitude: 2.5 V
Lead Channel Setting Pacing Amplitude: 2.5 V
Lead Channel Setting Pacing Pulse Width: 0.5 ms
Lead Channel Setting Pacing Pulse Width: 0.7 ms
Lead Channel Setting Sensing Sensitivity: 3 mV
Pulse Gen Model: 3562
Pulse Gen Serial Number: 9431558

## 2021-08-10 NOTE — Patient Instructions (Signed)
Medication Instructions:  The current medical regimen is effective;  continue present plan and medications.  *If you need a refill on your cardiac medications before your next appointment, please call your pharmacy*   Follow-Up: At [facility], you and your health needs are our priority.  As part of our continuing mission to provide you with exceptional heart care, we have created designated Provider Care Teams.  These Care Teams include your primary Cardiologist (physician) and Advanced Practice Providers (APPs -  Physician Assistants and Nurse Practitioners) who all work together to provide you with the care you need, when you need it.  We recommend signing up for the patient portal called "MyChart".  Sign up information is provided on this After Visit Summary.  MyChart is used to connect with patients for Virtual Visits (Telemedicine).  Patients are able to view lab/test results, encounter notes, upcoming appointments, etc.  Non-urgent messages can be sent to your provider as well.   To learn more about what you can do with MyChart, go to [email or web address].    Your next appointment:   6 month(s)  The format for your next appointment:   In Person  Provider:   [place] O'Neal, MD      

## 2021-08-10 NOTE — Progress Notes (Signed)
Cardiology Office Note:   Date:  08/10/2021  NAME:  Carol Hardin    MRN: [id number] DOB:  06-10-1933   PCP:  Celene Squibb, MD  Cardiologist:  Carlyle Dolly, MD  Electrophysiologist:  Thompson Grayer, MD   Referring MD: Celene Squibb, MD   Chief Complaint  Patient presents with   Congestive Heart Failure    History of Present Illness:   Carol Hardin is a 87 y.o. female with a hx of systolic heart failure, permanent atrial fibrillation status post AV node ablation, stroke who presents for follow-up.  She reports she is doing well.  No increased weight gain.  She can not actually gain weight.  She also endorses early satiety.  She was evaluated by gastroenterology.  CT abdomen pelvis was negative.  She apparently has not undergone EGD to due to her heart function.  If this is needed I informed her it is okay to proceed.  Apparently anesthesia had issues about sedatives in her.  She reports no significant chest pain or trouble breathing.  She is not that active.  She does use an exercise bike at home.  No major issues.  She takes Lasix as needed.  No evidence of volume overload.  She overall is stable.  We again discussed that her heart function is very weak.  She reports that she cannot tolerate digoxin.  She is only on Coreg and lisinopril.  She is tried numerous medications but has had side effects to all of them.  We have settled on Coreg and lisinopril.  Given her advanced age we have opted for conservative approach.  Her husband is Carol Hardin who also is a patient of mine date of birth 02/07/1938   Echo 06/24/2010: Report reviewed; EF 55-60% with moderate MR and PMVL prolapse  Echo 08/26/2016: Afib with RVR, EF 40%, PMVL prolapse, myxomatous valve with moderate to severe MR Echo 05/20/2018: SB, EF 40%, PMVL prolapse, with moderate to severe MR Echo 11/19/2018: EF 35-40%, PMVL prolapse, with moderate to severe MR Echo 03/05/2020: EF 20%, Severely dilated LV, LVIDD 7.0 cm, LVOT VTI  12 cm, BIV Paced, Severe MR   Problem List 1. Mitral Valve prolapse (PMVL prolapse with moderate to severe MR in 2017) 2. Systolic HF, EF 40% -non-ischemic etiology? -LVIDD 7.0 cm 3 SSS/Afib/Tachy-brady s/p CRT-P 4. Permanent Afib s/p AVN ablation  -intolerant of rhythm control agents and AVN ablation pursued 5. CVA -05/2018  Past Medical History: Past Medical History:  Diagnosis Date   Anxiety    Arthritis    "fingers, right toe" (08/09/2016)   Atrial fibrillation (HCC)    Basal cell carcinoma of lower leg, right    Bradycardia    a. Holter 08/30/16 showed profound bradycardia down to 30 during awake hours, several 2 second pauses, very frequent PVCs >8000 in 48 hours, and NSVT (longest of 14 beats).   Cardiomyopathy ([place])    a. EF 40% by echo 08/2016.   Daily headache    "I usually wake up w/a headache; sometimes it's a migraine" (08/09/2016)   Depression    Diverticulosis    Hx. of   Frequent PVCs    GERD (gastroesophageal reflux disease)    Hypercholesterolemia    Migraine    Mitral regurgitation    MVP (mitral valve prolapse)    NSVT (nonsustained ventricular tachycardia)    a. first noted event monitor 08/2016.   Osteoporosis    PAF (paroxysmal atrial fibrillation) ([place])    a. in  afib at time of echo 08/2016.   Pneumonia    Scoliosis    mild   Squamous cell carcinoma of neck    Stroke ([place]) 05/2018    Past Surgical History: Past Surgical History:  Procedure Laterality Date   AV NODE ABLATION N/A 01/23/2019   Procedure: AV NODE ABLATION;  Surgeon: Thompson Grayer, MD;  Location: [facility];  Service: Cardiovascular;  Laterality: N/A;   BASAL CELL CARCINOMA EXCISION Right 1980s   RLE   BIV PACEMAKER INSERTION CRT-P N/A 01/23/2019   Procedure: BIV PACEMAKER INSERTION CRT-P;  Surgeon: Thompson Grayer, MD;  Location: [facility];  Service: Cardiovascular;  Laterality: N/A;   BLEPHAROPLASTY     BREAST BIOPSY Left 1970s   BREAST CYST ASPIRATION Left  1970s   BREAST CYST EXCISION Left 1970s   BUNIONECTOMY Bilateral    DILATION AND CURETTAGE OF UTERUS     EXCISIONAL HEMORRHOIDECTOMY  1970s   INTRAMEDULLARY (IM) NAIL INTERTROCHANTERIC Left 12/31/2017   Procedure: INTRAMEDULLARY (IM) NAIL INTERTROCHANTRIC;  Surgeon: Nicholes Stairs, MD;  Location: [facility];  Service: Orthopedics;  Laterality: Left;   KNEE ARTHROSCOPY Right 04/12/2007   KNEE ARTHROSCOPY Left 1998   SQUAMOUS CELL CARCINOMA EXCISION     "neck"   TONSILLECTOMY      Current Medications: Current Meds  Medication Sig   acetaminophen (TYLENOL) 325 MG tablet Take 325-975 mg by mouth daily.   bismuth subsalicylate (PEPTO BISMOL) 262 MG/15ML suspension Take 30 mLs by mouth as needed.   butalbital-acetaminophen-caffeine (FIORICET) 50-325-40 MG tablet Take 1 tablet by mouth every 6 (six) hours as needed for headache.   carvedilol (COREG) 3.125 MG tablet Take 1 tablet (3.125 mg total) by mouth 2 (two) times daily.   ELIQUIS 2.5 MG TABS tablet TAKE 1 TABLET(2.5 MG) BY MOUTH TWICE DAILY   Ensure (ENSURE) Take 237 mLs by mouth daily.    fexofenadine (ALLEGRA) 60 MG tablet Take 1 tablet (60 mg total) by mouth daily.   furosemide (LASIX) 20 MG tablet Take 20 mg by mouth as needed for fluid.   lisinopril (ZESTRIL) 2.5 MG tablet TAKE 1 TABLET(2.5 MG) BY MOUTH DAILY   LORazepam (ATIVAN) 1 MG tablet Take 1 mg by mouth at bedtime.   Polyethyl Glycol-Propyl Glycol (SYSTANE OP) Apply to eye as needed.   topiramate (TOPAMAX) 25 MG tablet Take 2 tablets (50 mg total) by mouth at bedtime.     Allergies:    Omeprazole, Flagyl [metronidazole], Amiodarone, Aripiprazole, Hylan g-f 20, Metoprolol tartrate, Paroxetine, Paroxetine hcl, Statins, Sulfa antibiotics, Toprol xl [metoprolol succinate], Vancomycin, and Penicillins   Social History: Social History   Socioeconomic History   Marital status: Married    Spouse name: Not on file   Number of children: 4   Years of education: 2.5 yrs of  college   Highest education level: Not on file  Occupational History   Not on file  Tobacco Use   Smoking status: Former    Packs/day: 1.00    Years: 20.00    Pack years: 20.00    Types: Cigarettes    Quit date: 1989    Years since quitting: 33.8   Smokeless tobacco: Never  Vaping Use   Vaping Use: Never used  Substance and Sexual Activity   Alcohol use: No    Comment: quit 1995, was occasional    Drug use: Never   Sexual activity: Not Currently  Other Topics Concern   Not on file  Social History Narrative  Lives at home with spouse   Caffeine: 2 cups coffee/day   Social Determinants of Health   Financial Resource Strain: Not on file  Food Insecurity: Not on file  Transportation Needs: Not on file  Physical Activity: Not on file  Stress: Not on file  Social Connections: Not on file     Family History: The patient's family history includes Alzheimer's disease in her brother; COPD in her father; Heart Problems in her brother and son; Heart failure in her father and mother; Leukemia in her mother. There is no history of Allergic rhinitis, Angioedema, Asthma, Eczema, Atopy, Immunodeficiency, Urticaria, or Headache.  ROS:   All other ROS reviewed and negative. Pertinent positives noted in the HPI.     EKGs/Labs/Other Studies Reviewed:   The following studies were personally reviewed by me today:  TTE 03/05/2020  1. Left ventricular ejection fraction, by estimation, is 20%. The left  ventricle has severely decreased function. The left ventricle demonstrates  regional wall motion abnormalities (see scoring diagram/findings for  description). The left ventricular  internal cavity size was moderately dilated. Left ventricular diastolic  parameters are indeterminate. Elevated left atrial pressure.   2. Right ventricular systolic function is moderately reduced. The right  ventricular size is moderately enlarged. There is mildly elevated  pulmonary artery systolic pressure.    3. Left atrial size was severely dilated.   4. Right atrial size was moderately dilated.   5. The mitral valve is degenerative. Moderate to severe mitral valve  regurgitation.   6. Tricuspid valve regurgitation is moderate.   7. The aortic valve is tricuspid. Aortic valve regurgitation is mild. No  aortic stenosis is present.   8. The inferior vena cava is dilated in size with <50% respiratory  variability, suggesting right atrial pressure of 15 mmHg.   Recent Labs: 08/11/2020: TSH 3.66 02/16/2021: ALT 19 05/07/2021: BUN 12; Creatinine, Ser 0.97; Hemoglobin 12.4; Magnesium 2.0; Platelets 171; Potassium 4.7; Sodium 142   Recent Lipid Panel    Component Value Date/Time   CHOL 134 08/11/2020 0000   TRIG 67 08/11/2020 0000   HDL 41 08/11/2020 0000   CHOLHDL 3.3 01/23/2019 0631   VLDL 13 01/23/2019 0631   LDLCALC 79 08/11/2020 0000    Physical Exam:   VS:  BP 100/76 (BP Location: Right Arm, Patient Position: Sitting, Cuff Size: Small)   Pulse 60   Ht 5\' 4"  (1.626 m)   Wt 117 lb (53.1 kg)   BMI 20.08 kg/m    Wt Readings from Last 3 Encounters:  08/10/21 117 lb (53.1 kg)  07/29/21 117 lb 3.2 oz (53.2 kg)  05/07/21 120 lb (54.4 kg)    General: Well nourished, well developed, in no acute distress Head: Atraumatic, normal size  Eyes: PEERLA, EOMI  Neck: Supple, no JVD Endocrine: No thryomegaly Cardiac: Normal S1, S2; RRR; no murmurs, rubs, or gallops Lungs: Clear to auscultation bilaterally, no wheezing, rhonchi or rales  Abd: Soft, nontender, no hepatomegaly  Ext: No edema, pulses 2+ Musculoskeletal: No deformities, BUE and BLE strength normal and equal Skin: Warm and dry, no rashes   Neuro: Alert and oriented to person, place, time, and situation, CNII-XII grossly intact, no focal deficits  Psych: Normal mood and affect   ASSESSMENT:   AOI KOUNS is a 87 y.o. female who presents for the following: 1. Chronic systolic heart failure ([place])   2. Nonischemic  cardiomyopathy ([place])   3. CHB (complete heart block) (HCC)   4. Permanent atrial fibrillation (  [place])   5. Pacemaker     PLAN:   1. Chronic systolic heart failure ([place]) 2. Nonischemic cardiomyopathy (HCC) -Longstanding history of nonischemic cardiomyopathy.  Unclear if mitral valve regurgitation led to her cardiomyopathy or not.  Nonetheless she has a very advanced case of congestive heart failure.  Her LV end-diastolic diameter is 7 cm.  EF is 20%. -She is status post CRT-P.  She had an AV nodal ablation due to permanent A. fib.  ICD was not placed due to advanced age. -She is intolerant of most heart failure medications.  Cannot tolerate digoxin.  She is only able to tolerate Coreg 3.125 mg twice daily lisinopril 2.5 mg daily.  She is euvolemic on exam.  She will continue Lasix as needed. -We have discussed her case multiple times.  I highly suspect her cardiomyopathy is fearing end-stage.  She reports that she would like conservative approach.  I do agree with this given her advanced age.  She reports she is not interested in aggressive cardiovascular care including heart catheterization.  She would like to continue with a conservative approach.  I do agree with this.  Given her advanced age she is not a candidate for heart transplantation or advanced therapies.  She is not a candidate for LVAD.  We discussed continuing her current course.  She seems to be stable.  I will see her back in 6 months.  3. CHB (complete heart block) ([place]) 4. Permanent atrial fibrillation ([place]) 5. Pacemaker -History of permanent atrial fibrillation status post AV node ablation.  She has CRT-P.  Functioning well per recent interrogation.  She is on Eliquis 2.5 mg twice daily.  No bleeding issues.  We will continue with current therapy.  No change today.   Disposition: Return in about 6 months (around 02/07/2022).  Medication Adjustments/Labs and Tests Ordered: Current medicines are reviewed at length with the patient  today.  Concerns regarding medicines are outlined above.  No orders of the defined types were placed in this encounter.  No orders of the defined types were placed in this encounter.   Patient Instructions  Medication Instructions:  The current medical regimen is effective;  continue present plan and medications.  *If you need a refill on your cardiac medications before your next appointment, please call your pharmacy*   Follow-Up: At [facility], you and your health needs are our priority.  As part of our continuing mission to provide you with exceptional heart care, we have created designated Provider Care Teams.  These Care Teams include your primary Cardiologist (physician) and Advanced Practice Providers (APPs -  Physician Assistants and Nurse Practitioners) who all work together to provide you with the care you need, when you need it.  We recommend signing up for the patient portal called "MyChart".  Sign up information is provided on this After Visit Summary.  MyChart is used to connect with patients for Virtual Visits (Telemedicine).  Patients are able to view lab/test results, encounter notes, upcoming appointments, etc.  Non-urgent messages can be sent to your provider as well.   To learn more about what you can do with MyChart, go to [email or web address].    Your next appointment:   6 month(s)  The format for your next appointment:   In Person  Provider:   Eleonore Chiquito, MD      Time Spent with Patient: I have spent a total of 35 minutes with patient reviewing hospital notes, telemetry, EKGs, labs and examining the patient as well as  establishing an assessment and plan that was discussed with the patient.  > 50% of time was spent in direct patient care.  Signed, Addison Naegeli. Audie Box, MD, [facility]  [address] [place], [place] [address] [phone]  08/10/2021 3:07 PM

## 2021-08-11 ENCOUNTER — Telehealth: Payer: Self-pay | Admitting: Cardiovascular Disease

## 2021-08-11 NOTE — Telephone Encounter (Signed)
Returned call to patient, who was calling to let Dr. Audie Box know that per conversation in the office yesterday she has not called Vicente Males from GI in [place] but she would like to give Dr. Audie Box Anna's number which is [phone] if he would like to call her. Advised patient I would forward message to Dr. Audie Box to make him aware. Patient verbalized understanding.

## 2021-08-11 NOTE — Telephone Encounter (Signed)
Patient called to say didn't call Shelia Media and she will wait to hear from Webb Silversmith about their conversation.

## 2021-08-12 ENCOUNTER — Other Ambulatory Visit: Payer: Self-pay

## 2021-08-12 ENCOUNTER — Encounter: Payer: Self-pay | Admitting: Allergy & Immunology

## 2021-08-12 ENCOUNTER — Telehealth: Payer: Self-pay

## 2021-08-12 ENCOUNTER — Ambulatory Visit: Payer: Medicare Other | Admitting: Allergy & Immunology

## 2021-08-12 VITALS — BP 100/70 | HR 84 | Temp 98.1°F | Resp 14 | Ht 64.0 in | Wt 116.0 lb

## 2021-08-12 DIAGNOSIS — T63481D Toxic effect of venom of other arthropod, accidental (unintentional), subsequent encounter: Secondary | ICD-10-CM

## 2021-08-12 DIAGNOSIS — J310 Chronic rhinitis: Secondary | ICD-10-CM | POA: Diagnosis not present

## 2021-08-12 DIAGNOSIS — R0902 Hypoxemia: Secondary | ICD-10-CM | POA: Diagnosis not present

## 2021-08-12 MED ORDER — CLOBETASOL PROPIONATE 0.05 % EX SHAM: 1.0000 "application " | MEDICATED_SHAMPOO | CUTANEOUS | 3 refills | Status: DC

## 2021-08-12 NOTE — Telephone Encounter (Signed)
-----   Message from Geralynn Rile, MD sent at 08/12/2021  8:16 AM EDT ----- Regarding: GI follow-up Almyra Free:  Can you let Mrs. Liddell know that I discussed her case with gastroenterology.  Apparently anesthesia at [facility] had concerns about performing her EGD here.  She does have a low EF but is asymptomatic.  I informed gastroenterology that if they feel it was necessary for her to have an EGD they should proceed.  This will likely take place in [place].  Again we cannot make anesthesia provide this service.  If they believe she is too high risk that will be their decision but it appears that GI would like to have her evaluated in [place] to see if they will do this.  Could you please let her know when you get a chance?  Thanks!  [name]. Audie Box, MD, [facility]  [address] [place], [place] [address] [phone]  8:17 AM

## 2021-08-12 NOTE — Patient Instructions (Addendum)
1. Concern for insect bites. - There is no testing for midges to my knowledge. - I would add on clobetasol foam shampoo once weekly to see if this can help (can increase to three times per week).  - Continue with the Allegra daily.  2. Chronic rhinitis - with negative blood testing - Stop the NeilMed bottle and be more vigilant with the nasal ipratropium.  - Continue with Allegra one tablet daily. - Testing has been negative in the past.   3. Hypoxia (low oxygen level) - I am going to send a message to Geraldo Pitter to try to get you seen sooner rather than later due to the hypoxia (low oxygen) today. - I would add on oxygen at home during the day (change the amount to get it up to 90% oxygenation).  - I am sending a message to Geraldo Pitter to get this on her radar and I will contact Dr. Jaynee Eagles.  - I wonder if we need to do some more lung imaging?   4. Return in about 3 months (around 11/12/2021).    Please inform us of any Emergency Department visits, hospitalizations, or changes in symptoms. Call us before going to the ED for breathing or allergy symptoms since we might be able to fit you in for a sick visit. Feel free to contact us anytime with any questions, problems, or concerns.  It was a pleasure to see you today! I am so sorry that all of this is happening to you.  Websites that have reliable patient information: 1. American Academy of Asthma, Allergy, and Immunology: www.aaaai.org 2. Food Allergy Research and Education (FARE): foodallergy.org 3. Mothers of Asthmatics: http://www.asthmacommunitynetwork.org 4. American College of Allergy, Asthma, and Immunology: www.acaai.org  "Like" [place] on Facebook and Instagram for our latest updates!       Make sure you are registered to vote! If you have moved or changed any of your contact information, you will need to get this updated before voting!  In some cases, you MAY be able to register to vote online:  [email or web address]

## 2021-08-12 NOTE — Telephone Encounter (Signed)
Called patient, advised of message below from MD.  Patient states she will wait to here from Anna's office to see if they can get her set up for [facility].  No other questions at this time.  Thanks!

## 2021-08-12 NOTE — Progress Notes (Signed)
FOLLOW UP  Date of Service/Encounter:  08/12/21   Assessment:   SOB (shortness of breath) - now with daytime hypoxia  Chronic rhinitis   Headaches - followed by Carol Hardin  Concern for insect allergy (midges)   Carol Hardin presents for a follow-up visit for symptoms that are unlikely to be allergic in nature.  We are going to address her rhinitis with more aggressive use of her nasal ipratropium.  Testing in the past has been completely negative.  Her hypoxia is the most concerning thing today.  We did add on oxygen in the clinic at 2 L/min and her pulse ox dropped into the high 90s.  She does have oxygen at home so I recommended that she use it while she is in her home.  I am going to reach out to Carol Hardin with pulmonology to see who is prescribing the oxygen and whether it needs to be used during the day as well.  We will prescribe oxygen out of our clinic, so I will defer to her for her thoughts.  There is nothing we can do about these midges.  There is no testing for them at all.  Due to the scalp itching, I did recommend using clobetasol shampoo once a week.  We provided her with a prescription for this.  Plan/Recommendations:   1. Concern for insect bites. - There is no testing for midges to my knowledge. - I would add on clobetasol foam shampoo once weekly to see if this can help (can increase to three times per week).  - Continue with the Allegra daily.  2. Chronic rhinitis - with negative blood testing - Stop the NeilMed bottle and be more vigilant with the nasal ipratropium.  - Continue with Allegra one tablet daily. - Testing has been negative in the past.   3. Hypoxia (low oxygen level) - I am going to send a message to Carol Hardin to try to get you seen sooner rather than later due to the hypoxia (low oxygen) today. - I would add on oxygen at home during the day (change the amount to get it up to 90% oxygenation).  - I am sending a message to Carol Hardin to get this on her radar and I will contact Carol Hardin.  - I wonder if we need to do some more lung imaging?   4. Return in about 3 months (around 11/12/2021).    Subjective:   Carol Hardin is a 87 y.o. female presenting today for follow up of  Chief Complaint  Patient presents with   Chronic rhinitis    Going good    Carol Hardin has a history of the following: Patient Active Problem List   Diagnosis Date Noted   Frequent stools 03/24/2021   Abdominal pain 03/24/2021   Loss of weight 03/24/2021   Osteoporosis 08/26/2020   History of revision of total replacement of right hip joint 08/26/2020   Hemorrhoids 08/26/2020   Fracture of proximal phalanx of finger 07/02/2020   Disorder of skeletal muscle 01/07/2020   Muscular deconditioning 01/07/2020   Dysphagia 03/30/2019   Constipation 03/30/2019   Chronic hypoxemic respiratory failure ([place])    Oxygen dependent    Cardiac pacemaker in situ 01/23/2019   Sick sinus syndrome ([place]) 01/22/2019   CKD (chronic kidney disease) stage 3, GFR 30-59 ml/min ([place]) 01/21/2019   Acute on chronic systolic heart failure ([place]) 12/25/2018   Tachy-brady syndrome ([place]) 12/25/2018   Atrial fibrillation, chronic ([place])  12/24/2018   Congestive heart failure ([place]) 12/24/2018   Chronic atrial fibrillation ([place]) 12/24/2018   Malnutrition, calorie ([place]) 11/20/2018   Atrial fibrillation with rapid ventricular response ([place]) 11/18/2018   Community acquired pneumonia 11/18/2018   Acute on chronic diastolic heart failure ([place]) 11/18/2018   Trochanteric bursitis of left hip 05/23/2018   Stroke (cerebrum) ([place]) 05/19/2018   Hip fracture ([place]) 12/31/2017   Foreign body of both ears 07/01/2017   Presbycusis of both ears 07/01/2017   PAF (paroxysmal atrial fibrillation) ([facility])    Nonsustained ventricular tachycardia    Mitral valve regurgitation    Ventricular premature beats    Bradycardia    Essential hypertension    Hypokalemia     Diverticulitis 08/09/2016   Gastroesophageal reflux disease 07/26/2016   Globus pharyngeus 07/26/2016   Hoarseness 07/26/2016   Diverticulosis of colon 04/22/2016   Diverticulosis of large intestine 04/22/2016   Generalized anxiety disorder 04/22/2016   Irritable bowel syndrome 04/22/2016   Mitral valve prolapse 04/22/2016   Mixed hyperlipidemia 04/22/2016   Osteopenia 04/22/2016   Diverticulitis of large intestine 04/22/2016   Low blood pressure 09/24/2014   Hypercholesterolemia    History of depression    Dyspnea on exertion 05/31/2008    History obtained from: chart review and patient.  Carol Hardin is a 87 y.o. female presenting for a follow up visit.  She was last seen in October 2021 by myself.  At that time, for her rhinitis, we continue with salt water rinses as well as Allegra 1 tablet daily.  We referred her to see Carol Hardin for evaluation of her headaches.  She also had an elevated TSH, which she reports was not addressed by her PCP.  We referred her to endocrinology.  She had been seen by pulmonology who did not feel that her shortness of breath was related to amiodarone.  She was very frustrated that she was not back to her self after her surgery.  Since last visit, she has continued to have issues.  Asthma/Respiratory Symptom History: She says that she saw her Cardiologist and was told that her lungs were clear earlier this week. He felt that she no longer needed the Lasix scheduled. She now monitors her legs and was told that any time that she feels a lot of tightness and swelling in her legs, she tends to use the Lasix.  She had hypoxia today and this was only increased to 82% today during the visit. She has home O2 for nighttime at 2 LPM. She has a pulse ox at home but she says that it does not work during the day because her fingers are cold and blue. Reviewed my previous notes and she was in the high 90% on both of her previous visits (on room air). Per review of the  Neurology notes, it seems that she was started on night time oxygen in April 2020 by her PCP.  According to the patient, she was having breathing problems and oxygen was felt to be helpful.  We did hook her up to 2 L of oxygen and her pulse ox went up to 97 to 98%.  She also felt that her headaches improved.  She has a remote history of smoking. She quit in 1989 after having started around 1970 or so. She married Collins [place] who was a smoker, in 53. She started smoking because he was a smoker. This was around one pack per day.  She has not seen pulmonology since they told her amiodarone was not  blame for her shortness of breath.  She does have fond memories of visiting with Carol Hardin, the nurse practitioner with pulmonology.  She would be open to seeing her again.  Rhinitis Symptom History: She does report that she has a lot of clear discharge.  She denies any history of nasal polyps.  She is using her Allegra.  She does have a prescription for ipratropium.  She is not using it consistently.  She does use her NeilMed bottle.  GERD Symptom History: She is going to have an endoscopy to evaluate her "tight stomach". She is followed by GI with Dr. Abbey Chatters. She was going to be scheduled at [facility] for the procedure but they declined it due to her cardiac history. She is instead going to have the procedure done at [facility].   She has a sample of a bug that she describes as a midge. She presented back report of sorts during the visit, telling me all the information about midges that she had looked up.  She reports that these are causing her scalp to itch.  She did actually call her on-call physician over the summer and complained of an itchy scalp and talked about bugs entering her brain through her ears.  Our on-call physician sent in a short burst of low-dose prednisone.    She is wondering whether she could have testing to look for sensitization to these insects.  I told her that we do not have testing  for these insects.  She is not having anything in the way of anaphylaxis to these insects.  She was stung by a wasp point, but had no reactions.  She tells me that she is just sure that she is allergic to these.  She is close to tears at this point.  Otherwise, there have been no changes to her past medical history, surgical history, family history, or social history.    Review of Systems  Constitutional: Negative.  Negative for chills, fever, malaise/fatigue and weight loss.  HENT: Negative.  Negative for congestion, ear discharge and ear pain.        Positive for nasal drainage.  Eyes:  Negative for pain, discharge and redness.  Respiratory:  Negative for cough, sputum production, shortness of breath and wheezing.   Cardiovascular: Negative.  Negative for chest pain and palpitations.  Gastrointestinal:  Negative for abdominal pain, constipation, diarrhea, heartburn, nausea and vomiting.  Skin:  Positive for itching. Negative for rash.  Neurological:  Negative for dizziness and headaches.  Endo/Heme/Allergies:  Negative for environmental allergies. Does not bruise/bleed easily.      Objective:   Blood pressure 100/70, pulse 84, temperature 98.1 F (36.7 C), resp. rate 14, height 5\' 4"  (1.626 m), weight 116 lb (52.6 kg), SpO2 (!) 82 %. Body mass index is 19.91 kg/m.   Physical Exam:  Physical Exam Vitals reviewed.  Constitutional:      Appearance: She is well-developed.     Comments: Very talkative.  Tearful at times.  HENT:     Head: Normocephalic and atraumatic.     Right Ear: Tympanic membrane, ear canal and external ear normal.     Left Ear: Tympanic membrane, ear canal and external ear normal.     Nose: Rhinorrhea present. No nasal deformity, septal deviation or mucosal edema.     Right Turbinates: Not enlarged or swollen.     Left Turbinates: Not enlarged or swollen.     Right Sinus: No maxillary sinus tenderness or frontal sinus tenderness.  Left Sinus: No  maxillary sinus tenderness or frontal sinus tenderness.     Mouth/Throat:     Mouth: Mucous membranes are not pale and not dry.     Pharynx: Uvula midline.  Eyes:     General: Lids are normal. No allergic shiner.       Right eye: No discharge.        Left eye: No discharge.     Conjunctiva/sclera: Conjunctivae normal.     Right eye: Right conjunctiva is not injected. No chemosis.    Left eye: Left conjunctiva is not injected. No chemosis.    Pupils: Pupils are equal, round, and reactive to light.  Cardiovascular:     Rate and Rhythm: Normal rate and regular rhythm.     Heart sounds: Normal heart sounds.  Pulmonary:     Effort: Pulmonary effort is normal. No tachypnea, accessory muscle usage or respiratory distress.     Breath sounds: Normal breath sounds. No wheezing, rhonchi or rales.     Comments: Moving air well in all lung fields. Chest:     Chest wall: No tenderness.  Lymphadenopathy:     Cervical: No cervical adenopathy.  Skin:    Coloration: Skin is not pale.     Findings: No abrasion, erythema, petechiae or rash. Rash is not papular, urticarial or vesicular.  Neurological:     Mental Status: She is alert.  Psychiatric:        Behavior: Behavior is cooperative.     Diagnostic studies: We did administer 2 L of oxygen via nasal cannula.  Her pulse ox went from 78-90 7 to 98%.      Salvatore Marvel, MD  [facility]

## 2021-08-13 ENCOUNTER — Encounter: Payer: Self-pay | Admitting: Allergy & Immunology

## 2021-08-14 NOTE — Telephone Encounter (Signed)
Contacted patient in regards to this- see other telephone note.  Thanks!

## 2021-08-15 ENCOUNTER — Telehealth: Payer: Self-pay | Admitting: Student

## 2021-08-15 ENCOUNTER — Other Ambulatory Visit: Payer: Self-pay | Admitting: Neurology

## 2021-08-15 NOTE — Telephone Encounter (Signed)
   Patient called answering service with concerns about her medications.  Called and spoke with patient.  Patient states she accidentally took an extra dose of her Carvedilol this morning.  She is not taking her Lisinopril or Eliquis yet.  Patient is not having any symptoms.  At patient's visit with Dr. Audie Box last week, BP soft 100/76 and pulse 60. Of note, she has a PPM for CHB. Given soft BP, advised patient to hold today's dose of Lisinopril.  Advised her to go ahead and take her Eliquis as prescribed.  She asked about her evening dose of carvedilol.  She states her blood pressures typically run soft with systolic BP often in the 34K.  Advised patient to check her BP this evening and hold evening dose of Carvedilol if systolic BP less than 95.  Patient voiced understanding, agreed, and thanked me for calling.  Darreld Mclean, PA-C 08/15/2021 11:21 AM

## 2021-08-17 ENCOUNTER — Encounter: Payer: Self-pay | Admitting: Gastroenterology

## 2021-08-17 ENCOUNTER — Telehealth: Payer: Self-pay | Admitting: Allergy & Immunology

## 2021-08-17 NOTE — Progress Notes (Signed)
Remote pacemaker transmission.   

## 2021-08-17 NOTE — Telephone Encounter (Signed)
Carol Hardin called in and states the shampoo she was prescribed (Clobetasol Propionate) was too strong for her.  Unknown states she has real sensitive skin and can't even use baby shampoo and wants to know if there is another shampoo for real sensitive skin that can be prescribed.  Also, [name] mentioned she has real bad headaches and can't get anything done.  [name] wants to know if you will prescribe her a medication for headaches?  She has a particular one in mind, and she spelled out butalbital/acetaminophen and caffeine.  She states she is on Topamax and has one pill left but they will not refill her medication.  She states you would understand the headaches she is having and would like to know if you will call that in for her.  She uses [facility]  in [place] on [address]

## 2021-08-18 NOTE — Telephone Encounter (Signed)
I called the patient and went over the note by Dr.Gallagher. she verbalized understanding that as her allergy and asthma clinic we can not prescribe headache medication. She said her neurologist office that we referred her to will no longer refill her medication. Per Dr. Ernst Bowler I did inform the patient that she could call her PCP for a new referral for neurology or see if they will refill her headache medication for her. She wanted to thank Dr. Ernst Bowler for all his work on this issue.

## 2021-08-18 NOTE — Telephone Encounter (Signed)
I believe clobetasol is the only shampoo that has a steroid that is on the market.  There is a ketoconazole shampoo, but that is not a steroid and is more of an antifungal.  I would just stick with the clobetasol and just do it once a week.  She could even dilute it with some water to make it less strong.  As for the headaches, I do not prescribe any headache medicine.  She has a neurologist that she can contact - Dr. Jaynee Eagles I believe.   Salvatore Marvel, MD [facility]

## 2021-08-20 ENCOUNTER — Telehealth: Payer: Self-pay | Admitting: Adult Health

## 2021-08-20 NOTE — Telephone Encounter (Signed)
Spoke with pt and advised her of ONO results. Pt verbalized understanding and appreciation.

## 2021-08-20 NOTE — Telephone Encounter (Signed)
Ok to increase to 75 mg

## 2021-08-20 NOTE — Telephone Encounter (Signed)
Per Jinny Blossom NP her oxygen looked fine on the ONO results, no concerns. Results sent to medical records.

## 2021-08-20 NOTE — Telephone Encounter (Addendum)
I called the patient and let her know that per [facility] NP it is ok to increase the Topiramate to 75 mg each night. Pt stated she takes it in the evening because she takes Lorazepam at bedtime. She will increase the Topiramate and call us if she does not notice an improvement. She stated she has quite a bit of 25 mg supply. She was advised to call us when she needs a refill and we can send updated quantity and instructions to pharmacy. Her questions were answered.  She verbalized appreciation for the call.

## 2021-08-20 NOTE — Telephone Encounter (Signed)
Pt called, have not heard results of oxygen test.  Would like a call from the nurse.

## 2021-08-20 NOTE — Telephone Encounter (Signed)
Pt called, asking if NP still want to increase topiramate (TOPAMAX) 25 MG tablet to 75 mg.Would like a call from the nurse.

## 2021-08-20 NOTE — Telephone Encounter (Signed)
I called [facility] for Carol Hardin asking for the results to be faxed to our office.

## 2021-08-20 NOTE — Telephone Encounter (Signed)
I spoke with patient.  She does not feel like the topiramate 50 mg has "kicked in".  She increased from 25 mg at the end of October at her last office visit.  She is asking about increasing to 75 mg.  Her questions were answered during the call.  I let her know I would discuss with Megan NP and call her back.

## 2021-08-20 NOTE — Telephone Encounter (Signed)
Results received from [employer] Placed on MM NP's desk for review.

## 2021-08-25 ENCOUNTER — Telehealth: Payer: Self-pay | Admitting: Gastroenterology

## 2021-08-25 ENCOUNTER — Other Ambulatory Visit: Payer: Self-pay

## 2021-08-25 ENCOUNTER — Encounter: Payer: Self-pay | Admitting: Gastroenterology

## 2021-08-25 ENCOUNTER — Encounter: Payer: Self-pay | Admitting: *Deleted

## 2021-08-25 DIAGNOSIS — R1013 Epigastric pain: Secondary | ICD-10-CM

## 2021-08-25 DIAGNOSIS — R140 Abdominal distension (gaseous): Secondary | ICD-10-CM

## 2021-08-25 NOTE — Telephone Encounter (Signed)
PA approved via [facility]. Auth# [id number], DOS: 08/25/21-10/09/21   GES  Appt sent to pt via mychart

## 2021-08-25 NOTE — Telephone Encounter (Signed)
Labs have been released.

## 2021-08-25 NOTE — Telephone Encounter (Signed)
Notes abdominal tightness postprandially after eating supper. Feels pressure pushing up after eating. Notes early satiety. No pain with eating. No nausea.   Carol Hardin: I have ordered celiac labs, please just release them. Patient is going today.   [facility]: please arrange gastric emptying study due to abdominal bloating, dyspepsia. ASAP if possible. Thanks!

## 2021-08-25 NOTE — Telephone Encounter (Signed)
Noted they have been released

## 2021-08-25 NOTE — Addendum Note (Signed)
Addended by: Cheron Every on: 08/25/2021 09:12 AM   Modules accepted: Orders

## 2021-08-25 NOTE — Telephone Encounter (Signed)
Pt just called and she is very confused about the conversation that went happened with you and her. She doesn't understand why she has to have a 2 different studies done. She states to wait until tomorrow to call because she is leaving out. Pt is really confused why these test were even scheduled and she mentioned a test about being allergic to glucose?????.

## 2021-08-25 NOTE — Telephone Encounter (Signed)
Notes abdominal tightness postprandially after eating supper. Feels pressure pushing up after eating. Notes early satiety. No pain with eating. No nausea.    Dena: I have ordered celiac labs, please just release them. Patient is going today.    [facility]: please arrange gastric emptying study due to abdominal bloating, dyspepsia. ASAP if possible. Thanks!

## 2021-08-26 LAB — TISSUE TRANSGLUTAMINASE, IGA: (tTG) Ab, IgA: <1 U/mL

## 2021-08-26 LAB — IGA: Immunoglobulin A: 307 mg/dL (ref 70–320)

## 2021-08-27 ENCOUNTER — Telehealth: Payer: Self-pay | Admitting: Gastroenterology

## 2021-08-27 DIAGNOSIS — R140 Abdominal distension (gaseous): Secondary | ICD-10-CM

## 2021-08-27 DIAGNOSIS — R6881 Early satiety: Secondary | ICD-10-CM

## 2021-08-27 DIAGNOSIS — R634 Abnormal weight loss: Secondary | ICD-10-CM

## 2021-08-27 NOTE — Telephone Encounter (Signed)
Patient called asking if her endo could be done before the end of the year

## 2021-08-27 NOTE — Telephone Encounter (Signed)
Routing to [employer] NP. Clinical pool doesn't have any orders for pt to have a procedure.

## 2021-08-27 NOTE — Telephone Encounter (Signed)
Sent My-Chart message

## 2021-08-31 ENCOUNTER — Other Ambulatory Visit: Payer: Self-pay

## 2021-08-31 ENCOUNTER — Telehealth: Payer: Self-pay | Admitting: Cardiovascular Disease

## 2021-08-31 ENCOUNTER — Telehealth: Payer: Self-pay | Admitting: Internal Medicine

## 2021-08-31 MED ORDER — APIXABAN 2.5 MG PO TABS: ORAL_TABLET | ORAL | 0 refills | Status: DC

## 2021-08-31 NOTE — Addendum Note (Signed)
Addended by: Hassan Rowan on: 08/31/2021 03:40 PM   Modules accepted: Orders

## 2021-08-31 NOTE — Telephone Encounter (Signed)
Please advise to nasal spray as Dr Darnell Level is out of town

## 2021-08-31 NOTE — Telephone Encounter (Signed)
[facility] clinical pool:  Patient has postprandial bloating, pressure, early satiety. No abdominal pain. Weight loss noted. CTA without chronic mesenteric ischemia. Negative celiac serologies. Gallbladder present but does not appear to be biliary related. I had recommended EGD, but she is not a candidate for anesthesia here at [facility] with low EF. Her cardiologist has cleared her for a procedure as appropriate.  Please refer to [facility] if they are willing to see her and pursue EGD there.

## 2021-08-31 NOTE — Telephone Encounter (Signed)
1. Has your device fired? No   2. Is you device beeping? No   3. Are you experiencing draining or swelling at device site? No   4. Are you calling to see if we received your device transmission? No   5. Have you passed out? No  Patient is not having any acute symptoms or device issues. She is requesting a call back to review her most recent transmission in detail. Please return call to discuss when able.

## 2021-08-31 NOTE — Telephone Encounter (Signed)
*  STAT* If patient is at the pharmacy, call can be transferred to refill team.   1. Which medications need to be refilled? (please list name of each medication and dose if known)  Apixaban   2. Which pharmacy/location (including street and city if local pharmacy) is medication to be sent to?  [facility] [place] [address] [facility] [address]  3. Do they need a 30 day or 90 day supply?  30 day supply  Patient states she is in the donut hole and it is very expensive for her to purchase Eliquis. She would like to know if she can be put on the generic, Apixaban. If so,can it be sent to the pharmacy above. Please advise.

## 2021-08-31 NOTE — Telephone Encounter (Signed)
Spoke with patient she wanted to know if the transmission was normal, informed her it was and if there was anything concerning regarding her transmissions we would be in contact with her patient voiced understanding.

## 2021-08-31 NOTE — Telephone Encounter (Signed)
Prescription refill request for Eliquis received. Indication:Afib Last office visit:10/22 Scr:0.9 Age: 87 Weight:52.6 kg  Prescription refilled

## 2021-08-31 NOTE — Telephone Encounter (Signed)
Please have her try a nasal saline rinse and a nasal saline gel in addition to the ipratropium nasal spray. Please have her stop the ipratropium if she experiences more nasal bleeding. Have her note which side is bleeding and how long the bleeding lasts. Please have her call with any further episodes. Thank you

## 2021-08-31 NOTE — Telephone Encounter (Signed)
Referral sent to [facility] via Epic.  MyChart message sent to pt to let her know referral was sent to [facility].

## 2021-08-31 NOTE — Telephone Encounter (Signed)
Patient called stating she didn't do her nasal spray yesterday because she didn't have a headache. Today she does have one and experienced a little blood coming out of her nose after doing the Ipratropium. Patient is wondering if its okay to proceed with doing another dose today as it states she can do the Ipratropium One Spray 3x's per day?   Please Advise

## 2021-09-01 ENCOUNTER — Telehealth: Payer: Self-pay | Admitting: Cardiovascular Disease

## 2021-09-01 ENCOUNTER — Other Ambulatory Visit (HOSPITAL_COMMUNITY): Payer: Medicare Other

## 2021-09-01 NOTE — Telephone Encounter (Signed)
Patient has been informed of this information. She states she will give [place] a call if she has any more problems.

## 2021-09-01 NOTE — Telephone Encounter (Signed)
*  STAT* If patient is at the pharmacy, call can be transferred to refill team.   1. Which medications need to be refilled? (please list name of each medication and dose if known)  apixaban (ELIQUIS) 2.5 MG TABS tablet  2. Which pharmacy/location (including street and city if local pharmacy) is medication to be sent to? [facility] [place] [address]  3. Do they need a 30 day or 90 day supply?  30 day supply  Pt wants to know if she can get the Generic Brand for this medicine

## 2021-09-01 NOTE — Telephone Encounter (Signed)
Lm for pt to call us back  

## 2021-09-09 ENCOUNTER — Encounter: Payer: Self-pay | Admitting: Neurology

## 2021-09-21 ENCOUNTER — Telehealth: Payer: Self-pay | Admitting: Internal Medicine

## 2021-09-21 NOTE — Telephone Encounter (Signed)
Returned patients phone call.   Patient does not report of any s/s of infection to device area. Advised patient that is she notes are redness, drainage, swelling to please call the device clinic to let us know. Verbalized understanding.

## 2021-09-21 NOTE — Telephone Encounter (Signed)
Pt has 7 teeth in her mouth that have bacteria growing on them, one tooth fell in the sink while she was brushing, all 7 teeth are on the left side of her mouth. The dentist is charging pt. $7,000 to remove 7 teeth. Pt wants to know if the bacteria is affecting her pacemaker. Pt said she does not see a lot of elderly people visiting this dental office.

## 2021-09-23 ENCOUNTER — Ambulatory Visit: Payer: Medicare Other | Admitting: Adult Health

## 2021-09-25 ENCOUNTER — Telehealth: Payer: Self-pay | Admitting: Internal Medicine

## 2021-09-25 NOTE — Telephone Encounter (Signed)
Spoke with pt who states that she has swelling and tightness to the left foot. When she stands both feet turn red. Pt reports taking Lasix 20 mg for the last 3 days with no change to the swelling. Denies that foot is not warm to touch, or red when resting. Current weight is 116lbs.Please advise.

## 2021-09-25 NOTE — Telephone Encounter (Signed)
Pt c/o swelling: STAT is pt has developed SOB within 24 hours  How much weight have you gained and in what time span?  Patient states she does not keep track of her weights  If swelling, where is the swelling located?  Feet (mainly left)  Are you currently taking a fluid pill?  Yes, patient takes Lasix  Are you currently SOB?  No   Do you have a log of your daily weights (if so, list)?  No   Have you gained 3 pounds in a day or 5 pounds in a week?  No log available  Have you traveled recently?  No

## 2021-09-25 NOTE — Telephone Encounter (Signed)
Pt notified to contact PCP or urgent care regarding swelling to left foot.

## 2021-10-07 ENCOUNTER — Other Ambulatory Visit: Payer: Self-pay

## 2021-10-07 ENCOUNTER — Encounter: Payer: Self-pay | Admitting: Emergency Medicine

## 2021-10-07 ENCOUNTER — Ambulatory Visit: Payer: Medicare Other | Admitting: Emergency Medicine

## 2021-10-07 DIAGNOSIS — J9611 Chronic respiratory failure with hypoxia: Secondary | ICD-10-CM | POA: Diagnosis not present

## 2021-10-07 NOTE — Assessment & Plan Note (Signed)
Her breathing has been stable.  CT chest and pulmonary function testing both reassuring and 2021.  She does have nocturnal hypoxemia.  They had trouble transducing her SPO2 recently at her allergist office.  We are able to establish adequate saturations at rest here today.  Plan to ambulate on room air to ensure no desaturation.  Walking oximetry on room air today. Please continue your oxygen 2 L/min at night while sleeping. Your CT scan of the chest and your pulmonary function testing performed in 2021 are both reassuring.  Good news. We will not start any inhaled medication at this time. Continue to follow with Dr. Ernst Bowler with Allergy Follow with Dr Lamonte Sakai as needed for any changes in your breathing or respiratory symptoms.

## 2021-10-07 NOTE — Progress Notes (Signed)
Subjective:    Patient ID: Carol Hardin, female    DOB: 09/02/1933, 87 y.o.   MRN: [id number]  HPI 87 year old woman with history of former tobacco (20 pack years), atrial fibrillation and tachy-brady syndrome and AV nodal ablation plus pacer, chronic systolic CHF (EF 40-08%), MVP with mitral regurgitation, GERD.  She is referred today to discuss possible amiodarone toxicity.  She was started on amiodarone in March while admitted to [facility], continued about a month. She describes quick development of GI sx > nausea, constipation, GERD / emesis. She improved when the amiodarone was stopped, but she still intermittently has symptoms and ascribes them to the drug. She states that she has a good appetite but he cannot gain her weight back  She was experiencing shortness of breath beginning of 2020 and into March, she still has dyspnea, feels a tightness or restriction on the size of her breath, seems to happen both at rest and w exertion. Her exertional tolerance is decreased by breathing, associated anxiety, also possibly some back pain.  She denies cough, wheeze.   She has seen gastroenterology on 12/29 for dysphagia (especially bread) and constipation, poor p.o. intake.  She was concerned that these symptoms did coincide with initiation of amiodarone.  No invasive interventions were planned  She was just seen in Allergy clinic, has had chronic rhinitis. Allergen serologies 10/17/19 all negative.   Spirometry performed on 10/18/2019 reviewed by me shows a poor trial, uninterpretable due to low expiratory time (less than 2 seconds)  Chest x-ray 01/24/2019 post pacemaker placement showed stable cardiomegaly some left basilar atelectasis without any other significant infiltrates. CT scan of the abdomen 08/20/2019 reviewed by me shows some possible regional air trapping at the bases versus very subtle groundglass, no overt infiltrates   ROV 10/07/21 --follow-up visit 87 year old woman, former smoker with  A. fib and prior amiodarone use, systolic CHF, GERD.  I saw her in early 2021 and a high-resolution CT scan of the chest was reassuring without any evidence of amiodarone change or ILD.  There may been some air trapping.  She had normal spirometry 01/07/2020. She was seen at Allergy office and they had trouble getting her SpO2 to read, ? Hypoxemia.  She is on stable dose lasix On allegra ONO on 2L/min 08/06/21 > no desaturations, plan to continue 2L/min PSG 06/26/21 >> AHI 1.9/hour, she did have documented hypoxemia.    Review of Systems She has dyspnea w exertion, housework.  She denies any CP Minimal cough    Past Medical History:  Diagnosis Date   Anxiety    Arthritis    "fingers, right toe" (08/09/2016)   Atrial fibrillation (HCC)    Basal cell carcinoma of lower leg, right    Bradycardia    a. Holter 08/30/16 showed profound bradycardia down to 30 during awake hours, several 2 second pauses, very frequent PVCs >8000 in 48 hours, and NSVT (longest of 14 beats).   Cardiomyopathy ([place])    a. EF 40% by echo 08/2016.   Daily headache    "I usually wake up w/a headache; sometimes it's a migraine" (08/09/2016)   Depression    Diverticulosis    Hx. of   Frequent PVCs    GERD (gastroesophageal reflux disease)    Hypercholesterolemia    Migraine    Mitral regurgitation    MVP (mitral valve prolapse)    NSVT (nonsustained ventricular tachycardia)    a. first noted event monitor 08/2016.   Osteoporosis    PAF (  paroxysmal atrial fibrillation) ([place])    a. in afib at time of echo 08/2016.   Pneumonia    Scoliosis    mild   Squamous cell carcinoma of neck    Stroke ([place]) 05/2018     Family History  Problem Relation Age of Onset   Heart failure Mother    Leukemia Mother    Heart failure Father    COPD Father    Alzheimer's disease Brother    Heart Problems Brother        stent   Heart Problems Son        stent    Allergic rhinitis Neg Hx    Angioedema Neg Hx    Asthma  Neg Hx    Eczema Neg Hx    Atopy Neg Hx    Immunodeficiency Neg Hx    Urticaria Neg Hx    Headache Neg Hx      Social History   Socioeconomic History   Marital status: Married    Spouse name: Not on file   Number of children: 4   Years of education: 2.5 yrs of college   Highest education level: Not on file  Occupational History   Not on file  Tobacco Use   Smoking status: Former    Packs/day: 1.00    Years: 20.00    Pack years: 20.00    Types: Cigarettes    Quit date: 1989    Years since quitting: 34.0   Smokeless tobacco: Never  Vaping Use   Vaping Use: Never used  Substance and Sexual Activity   Alcohol use: No    Comment: quit 1995, was occasional    Drug use: Never   Sexual activity: Not Currently  Other Topics Concern   Not on file  Social History Narrative   Lives at home with spouse   Caffeine: 2 cups coffee/day   Social Determinants of Health   Financial Resource Strain: Not on file  Food Insecurity: Not on file  Transportation Needs: Not on file  Physical Activity: Not on file  Stress: Not on file  Social Connections: Not on file  Intimate Partner Violence: Not on file     Allergies  Allergen Reactions   Omeprazole Other (See Comments)    siezure   Flagyl [Metronidazole] Nausea And Vomiting   Amiodarone    Aripiprazole Other (See Comments)    Unknown reaction   Hylan G-F 20 Other (See Comments)    Unknown reaction   Metoprolol Tartrate    Paroxetine    Paroxetine Hcl Other (See Comments)    Headaches (a long time ago- pt doesn't really remember)   Statins Other (See Comments)    No specific reaction given-patient states that she was advised not to take by physician   Sulfa Antibiotics Diarrhea and Other (See Comments)    Colitis    Toprol Xl [Metoprolol Succinate] Other (See Comments)    Dizziness--pt doesn't really remember    Vancomycin Itching and Other (See Comments)    Pt reports that they gave it too fast- she started having  itching in the scalp   Penicillins Rash    DID THE REACTION INVOLVE: Swelling of the face/tongue/throat, SOB, or low BP? No Sudden or severe rash/hives, skin peeling, or the inside of the mouth or nose? Yes Did it require medical treatment? Unknown When did it last happen?  Over 10 years     If all above answers are NO, may proceed with cephalosporin use.  Outpatient Medications Prior to Visit  Medication Sig Dispense Refill   acetaminophen (TYLENOL) 325 MG tablet Take 325-975 mg by mouth daily.     apixaban (ELIQUIS) 2.5 MG TABS tablet TAKE 1 TABLET(2.5 MG) BY MOUTH TWICE DAILY 60 tablet 0   bismuth subsalicylate (PEPTO BISMOL) 262 MG/15ML suspension Take 30 mLs by mouth as needed.     carvedilol (COREG) 3.125 MG tablet Take 3.125 mg by mouth 2 (two) times daily with a meal.     Ensure (ENSURE) Take 237 mLs by mouth daily.      fexofenadine (ALLEGRA) 60 MG tablet Take 1 tablet (60 mg total) by mouth daily. 30 tablet 5   furosemide (LASIX) 20 MG tablet Take 20 mg by mouth as needed for fluid.     lisinopril (ZESTRIL) 2.5 MG tablet TAKE 1 TABLET(2.5 MG) BY MOUTH DAILY 90 tablet 0   LORazepam (ATIVAN) 1 MG tablet Take 1 mg by mouth at bedtime.     mirabegron ER (MYRBETRIQ) 50 MG TB24 tablet Take 1 tablet by mouth daily.     Polyethyl Glycol-Propyl Glycol (SYSTANE OP) Apply to eye as needed.     topiramate (TOPAMAX) 25 MG tablet Take 2 tablets (50 mg total) by mouth at bedtime. (Patient taking differently: Take 50 mg by mouth every evening.) 60 tablet 3   MYRBETRIQ 50 MG TB24 tablet Take 50 mg by mouth daily.     carvedilol (COREG) 3.125 MG tablet Take 1 tablet (3.125 mg total) by mouth 2 (two) times daily. 180 tablet 3   butalbital-acetaminophen-caffeine (FIORICET) 50-325-40 MG tablet Take 1 tablet by mouth every 6 (six) hours as needed for headache. 10 tablet 0   Clobetasol Propionate 0.05 % shampoo Apply 1 application topically once a week. 118 mL 3   ipratropium (ATROVENT) 0.06 %  nasal spray USE 1 SPRAY IN EACH NOSTRIL THREE TIMES DAILY (Patient not taking: No sig reported) 15 mL 5   No facility-administered medications prior to visit.        Objective:   Physical Exam  Vitals:   10/07/21 1352  BP: 94/60  Pulse: 70  Temp: 97.7 F (36.5 C)  TempSrc: Oral  SpO2: 98%  Weight: 114 lb (51.7 kg)  Height: 5\' 4"  (1.626 m)    Gen: Pleasant, quite thin, in no distress,  Anxious affect  ENT: No lesions,  mouth clear,  oropharynx clear, no postnasal drip  Neck: No JVD, no stridor  Lungs: No use of accessory muscles, no crackles or wheezing on normal respiration, no wheeze on forced expiration  Cardiovascular: RRR, heart sounds normal, no murmur or gallops, no peripheral edema  Musculoskeletal: No deformities, no cyanosis or clubbing  Neuro: alert, awake, non focal  Skin: Warm, no lesions or rash      Assessment & Plan:  Chronic hypoxemic respiratory failure (HCC) Her breathing has been stable.  CT chest and pulmonary function testing both reassuring and 2021.  She does have nocturnal hypoxemia.  They had trouble transducing her SPO2 recently at her allergist office.  We are able to establish adequate saturations at rest here today.  Plan to ambulate on room air to ensure no desaturation.  Walking oximetry on room air today. Please continue your oxygen 2 L/min at night while sleeping. Your CT scan of the chest and your pulmonary function testing performed in 2021 are both reassuring.  Good news. We will not start any inhaled medication at this time. Continue to follow with Dr. Ernst Bowler with Allergy Follow with  Dr Lamonte Sakai as needed for any changes in your breathing or respiratory symptoms.  Baltazar Apo, MD, PhD 10/07/2021, 2:19 PM [facility] [phone] or if no answer [phone]

## 2021-10-07 NOTE — Patient Instructions (Addendum)
Walking oximetry on room air today. Please continue your oxygen 2 L/min at night while sleeping. Your CT scan of the chest and your pulmonary function testing performed in 2021 are both reassuring.  Good news. We will not start any inhaled medication at this time. Continue to follow with Dr. Ernst Bowler with Allergy Follow with Dr Lamonte Sakai as needed for any changes in your breathing or respiratory symptoms.

## 2021-10-13 DIAGNOSIS — R0600 Dyspnea, unspecified: Secondary | ICD-10-CM | POA: Diagnosis not present

## 2021-10-13 DIAGNOSIS — I5020 Unspecified systolic (congestive) heart failure: Secondary | ICD-10-CM | POA: Diagnosis not present

## 2021-10-13 DIAGNOSIS — R0602 Shortness of breath: Secondary | ICD-10-CM | POA: Diagnosis not present

## 2021-10-13 DIAGNOSIS — R0902 Hypoxemia: Secondary | ICD-10-CM | POA: Diagnosis not present

## 2021-10-20 ENCOUNTER — Other Ambulatory Visit: Payer: Self-pay | Admitting: Cardiovascular Disease

## 2021-10-22 DIAGNOSIS — R350 Frequency of micturition: Secondary | ICD-10-CM | POA: Diagnosis not present

## 2021-10-28 ENCOUNTER — Other Ambulatory Visit: Payer: Self-pay

## 2021-10-28 ENCOUNTER — Encounter (HOSPITAL_BASED_OUTPATIENT_CLINIC_OR_DEPARTMENT_OTHER): Payer: Self-pay | Admitting: Internal Medicine

## 2021-10-28 ENCOUNTER — Ambulatory Visit (HOSPITAL_BASED_OUTPATIENT_CLINIC_OR_DEPARTMENT_OTHER): Payer: Medicare Other | Admitting: Internal Medicine

## 2021-10-28 VITALS — BP 100/72 | HR 71 | Ht 64.0 in | Wt 113.6 lb

## 2021-10-28 DIAGNOSIS — Z950 Presence of cardiac pacemaker: Secondary | ICD-10-CM | POA: Diagnosis not present

## 2021-10-28 DIAGNOSIS — I493 Ventricular premature depolarization: Secondary | ICD-10-CM

## 2021-10-28 DIAGNOSIS — I495 Sick sinus syndrome: Secondary | ICD-10-CM

## 2021-10-28 DIAGNOSIS — I428 Other cardiomyopathies: Secondary | ICD-10-CM | POA: Diagnosis not present

## 2021-10-28 DIAGNOSIS — I482 Chronic atrial fibrillation, unspecified: Secondary | ICD-10-CM | POA: Diagnosis not present

## 2021-10-28 NOTE — Progress Notes (Signed)
PCP: Celene Squibb, MD Primary Cardiologist: Dr Marisue Ivan Primary EP:  Dr Rayann Heman  Carol Hardin is a 88 y.o. female who presents today for routine electrophysiology followup.  Since last being seen in our clinic, the patient reports doing reasonably well.   She is very concerned today about weight loss.  She reports poor appetite.  She has GI workup on going.   She is quite frustrated with her overall health.  She has numerous somatic complaints.  She reports frequent coldness of her hands.  She has rare "tingling" all over.  She is not very active. She is critical of the care that she has received from multiple prior specialists including the "hospital physician" who administered IV amiodarone, Dr Harl Bowie, myself, and Dr Marisue Ivan.  She states "no one ever explains anything to me". Today, she had multiple questions about her weight loss, her pacemaker, dietary restrictions, etc.  I did answer these the best that I was able with the time restraint.  Our visit was over 30 minutes today.  Of note, early during our visit when she was upset about her weight, she stood up and pulled her shirt up revealing her breast.  She stated "look at me.  Would you be happy if your wife looked like this".  I explained the inappropriateness of the comment and immediately exited the room and had my nurse Myrtie Hawk) go back in with me for the remainder of the visit.  She made a similar comment to [place] and again revealed herself.   I did make office administrator ([place]) aware.    Past Medical History:  Diagnosis Date   Anxiety    Arthritis    "fingers, right toe" (08/09/2016)   Atrial fibrillation (HCC)    Basal cell carcinoma of lower leg, right    Bradycardia    a. Holter 08/30/16 showed profound bradycardia down to 30 during awake hours, several 2 second pauses, very frequent PVCs >8000 in 48 hours, and NSVT (longest of 14 beats).   Cardiomyopathy ([place])    a. EF 40% by echo 08/2016.   Daily headache     "I usually wake up w/a headache; sometimes it's a migraine" (08/09/2016)   Depression    Diverticulosis    Hx. of   Frequent PVCs    GERD (gastroesophageal reflux disease)    Hypercholesterolemia    Migraine    Mitral regurgitation    MVP (mitral valve prolapse)    NSVT (nonsustained ventricular tachycardia)    a. first noted event monitor 08/2016.   Osteoporosis    PAF (paroxysmal atrial fibrillation) ([place])    a. in afib at time of echo 08/2016.   Pneumonia    Scoliosis    mild   Squamous cell carcinoma of neck    Stroke ([place]) 05/2018   Past Surgical History:  Procedure Laterality Date   AV NODE ABLATION N/A 01/23/2019   Procedure: AV NODE ABLATION;  Surgeon: Thompson Grayer, MD;  Location: [facility];  Service: Cardiovascular;  Laterality: N/A;   BASAL CELL CARCINOMA EXCISION Right 1980s   RLE   BIV PACEMAKER INSERTION CRT-P N/A 01/23/2019   Procedure: BIV PACEMAKER INSERTION CRT-P;  Surgeon: Thompson Grayer, MD;  Location: [facility];  Service: Cardiovascular;  Laterality: N/A;   BLEPHAROPLASTY     BREAST BIOPSY Left 1970s   BREAST CYST ASPIRATION Left 1970s   BREAST CYST EXCISION Left 1970s   BUNIONECTOMY Bilateral  DILATION AND CURETTAGE OF UTERUS     EXCISIONAL HEMORRHOIDECTOMY  1970s   INTRAMEDULLARY (IM) NAIL INTERTROCHANTERIC Left 12/31/2017   Procedure: INTRAMEDULLARY (IM) NAIL INTERTROCHANTRIC;  Surgeon: Nicholes Stairs, MD;  Location: [facility];  Service: Orthopedics;  Laterality: Left;   KNEE ARTHROSCOPY Right 04/12/2007   KNEE ARTHROSCOPY Left 1998   SQUAMOUS CELL CARCINOMA EXCISION     "neck"   TONSILLECTOMY      ROS- all systems are reviewed and negative except as per HPI above  Current Outpatient Medications  Medication Sig Dispense Refill   acetaminophen (TYLENOL) 325 MG tablet Take 325-975 mg by mouth daily.     apixaban (ELIQUIS) 2.5 MG TABS tablet TAKE 1 TABLET(2.5 MG) BY MOUTH TWICE DAILY 60 tablet 0   bismuth subsalicylate  (PEPTO BISMOL) 262 MG/15ML suspension Take 30 mLs by mouth as needed.     carvedilol (COREG) 3.125 MG tablet Take 3.125 mg by mouth 2 (two) times daily with a meal.     Ensure (ENSURE) Take 237 mLs by mouth daily.      fexofenadine (ALLEGRA) 60 MG tablet Take 1 tablet (60 mg total) by mouth daily. 30 tablet 5   furosemide (LASIX) 20 MG tablet Take 20 mg by mouth as needed for fluid.     lisinopril (ZESTRIL) 2.5 MG tablet TAKE 1 TABLET(2.5 MG) BY MOUTH DAILY 90 tablet 0   LORazepam (ATIVAN) 1 MG tablet Take 1 mg by mouth at bedtime.     mirabegron ER (MYRBETRIQ) 50 MG TB24 tablet Take 1 tablet by mouth daily.     Polyethyl Glycol-Propyl Glycol (SYSTANE OP) Apply to eye as needed.     topiramate (TOPAMAX) 25 MG tablet Take 2 tablets (50 mg total) by mouth at bedtime. (Patient taking differently: Take 50 mg by mouth every evening.) 60 tablet 3   carvedilol (COREG) 3.125 MG tablet Take 1 tablet (3.125 mg total) by mouth 2 (two) times daily. 180 tablet 3   No current facility-administered medications for this visit.    Physical Exam: Vitals:   10/28/21 1414  BP: 100/72  Pulse: 71  Weight: 113 lb 9.6 oz (51.5 kg)  Height: 5\' 4"  (1.626 m)    GEN- The patient is elderly appearing, alert and oriented x 3 today.  Somewhat inappropriate as above during the visit Head- normocephalic, atraumatic Eyes-  Sclera clear, conjunctiva pink Ears- hearing intact Oropharynx- clear Lungs- Clear to ausculation bilaterally, normal work of breathing Chest- pacemaker pocket is well healed Heart- Regular rate and rhythm (paced) GI- soft, NT, ND, + BS Extremities- no clubbing, cyanosis, + trace edema  Pacemaker interrogation- reviewed in detail today,  See PACEART report  ekg tracing ordered today is personally reviewed and shows afib, BiV paced  Assessment and Plan:  1. Symptomatic complete heart block Normal BiV pacemaker function See Pace Art report No changes today she is device dependant  today Due to cost concerns, she states today that she will no longer participate in remote monitoring.  Per her request, we will un enroll in remote monitoring\.  2. Permanent afib Rate controlled On eliquis  3. Chronic systolic dysfunction EF 56% Medically managed by Dr Marisue Ivan, though medical management has been limited previously by intolerance to medications  4. PVCs BiV paces 89% Not an ICD candidate given advanced age  She will return in 6 months for device clinic visit as she no longer wishes to have remotes performed. Return in a year to see Dr Lovena Le for EP care in  [place]  Follow-up with Dr Marisue Ivan as scheduled  Very challenging and prolonged visit today as noted above.  I spent more than 30 minutes in discussion with the patient and in answering her questions.  Thompson Grayer MD, [facility] 10/28/2021 4:37 PM

## 2021-10-28 NOTE — Patient Instructions (Addendum)
Medication Instructions:  Your physician recommends that you continue on your current medications as directed. Please refer to the Current Medication list given to you today. *If you need a refill on your cardiac medications before your next appointment, please call your pharmacy*  Lab Work: None ordered. If you have labs (blood work) drawn today and your tests are completely normal, you will receive your results only by: [place] (if you have MyChart) OR A paper copy in the mail If you have any lab test that is abnormal or we need to change your treatment, we will call you to review the results.  Testing/Procedures: None ordered.  Follow-Up: At [facility], you and your health needs are our priority.  As part of our continuing mission to provide you with exceptional heart care, we have created designated Provider Care Teams.  These Care Teams include your primary Cardiologist (physician) and Advanced Practice Providers (APPs -  Physician Assistants and Nurse Practitioners) who all work together to provide you with the care you need, when you need it.  Your next appointment:   Your physician wants you to follow-up in: one year with Dr. Lovena Le in [place].  You will receive a reminder letter in the mail two months in advance. If you don't receive a letter, please call our office to schedule the follow-up appointment.  Remote monitoring is used to monitor your Pacemaker from home. This monitoring reduces the number of office visits required to check your device to one time per year. It allows [place] to keep an eye on the functioning of your device to ensure it is working properly. You are scheduled for a device check from home on 11/09/2021. You may send your transmission at any time that day. If you have a wireless device, the transmission will be sent automatically. After your physician reviews your transmission, you will receive a postcard with your next transmission date.

## 2021-10-29 ENCOUNTER — Telehealth: Payer: Self-pay | Admitting: Cardiovascular Disease

## 2021-10-29 NOTE — Telephone Encounter (Signed)
Patient calling states she saw Dr. Rayann Heman yesterday and he did an ekg. She says the ekg results on mychart said it was abnormal. She would like Dr. Audie Box to review the results and her office visit. She would like a call back from a nurse.

## 2021-10-29 NOTE — Telephone Encounter (Signed)
Called pt and left detailed message that Dr Rayann Heman would have told her at the appt if anything was abnormal.

## 2021-11-13 ENCOUNTER — Ambulatory Visit: Payer: Medicare Other | Admitting: Allergy & Immunology

## 2021-11-13 DIAGNOSIS — R0902 Hypoxemia: Secondary | ICD-10-CM | POA: Diagnosis not present

## 2021-11-13 DIAGNOSIS — R140 Abdominal distension (gaseous): Secondary | ICD-10-CM

## 2021-11-13 DIAGNOSIS — I5020 Unspecified systolic (congestive) heart failure: Secondary | ICD-10-CM | POA: Diagnosis not present

## 2021-11-13 DIAGNOSIS — R6881 Early satiety: Secondary | ICD-10-CM

## 2021-11-13 DIAGNOSIS — R0602 Shortness of breath: Secondary | ICD-10-CM | POA: Diagnosis not present

## 2021-11-13 DIAGNOSIS — R0600 Dyspnea, unspecified: Secondary | ICD-10-CM | POA: Diagnosis not present

## 2021-11-18 ENCOUNTER — Telehealth: Payer: Self-pay | Admitting: *Deleted

## 2021-11-18 DIAGNOSIS — I700 Atherosclerosis of aorta: Secondary | ICD-10-CM

## 2021-11-18 NOTE — Telephone Encounter (Signed)
° °  Pre-operative Risk Assessment    Patient Name: Carol Hardin  DOB: [birth year] MRN: [id number]     Request for Surgical Clearance    Procedure:  Dental Extraction - Amount of Teeth to be Pulled:  LEFT MESSAGE FOR CALL BACK TO CLARIFY  Date of Surgery:  Clearance TBD                                 Surgeon:  LEFT MESSAGE NEED NAME OF DENTIST DOING PROCEDURE Surgeon's Group or Practice Name:  [facility]  Phone number:  [phone] Fax number:  [phone]   Type of Clearance Requested:   - Medical  - Pharmacy:  Hold Apixaban (Eliquis)     Type of Anesthesia:  Not Indicated LEFT MESSAGE CALL BACK WITH TYPE OF ANESTHESIA   Additional requests/questions:   ALSO IT IS NOTED ON THE CLEARANCE FORM ABOUT PACEMAKER; LEFT MESSAGE TO CALL BACK AND CLARIFY IF ASKING FOR DEVICE CLEARANCE  Signed, Julaine Hua   11/18/2021, 4:41 PM

## 2021-11-19 NOTE — Telephone Encounter (Signed)
° °  Patient Name: Carol Hardin  DOB: 1933/09/26 MRN: [id number]  Primary Cardiologist: Carlyle Dolly, MD  Chart reviewed as part of pre-operative protocol coverage.   Simple (1 to 2 teeth) dental extractions are considered low risk procedures per guidelines and generally do not require any specific cardiac clearance. It is also generally accepted that for simple extractions and dental cleanings, there is no need to interrupt blood thinner therapy.   SBE prophylaxis is not required for the patient from a cardiac standpoint.  I will route this recommendation to the requesting party via Epic fax function and remove from pre-op pool.  Please call with questions.  Mable Fill, Marissa Nestle, NP 11/19/2021, 2:48 PM

## 2021-11-19 NOTE — Telephone Encounter (Signed)
UPDATE: I s/w Ryan with [employer] today; see update.  PROCEDURE: 1 TOOTH TO BE EXTRACTED ANESTHESIA: LOCAL DENTIST: DR. Minna Antis, DDS  CONFIRMED THEY ARE NOT ASKING FOR PACEMAKER CLEARANCE

## 2021-11-20 ENCOUNTER — Other Ambulatory Visit: Payer: Self-pay

## 2021-11-20 ENCOUNTER — Encounter: Payer: Self-pay | Admitting: Allergy & Immunology

## 2021-11-20 ENCOUNTER — Ambulatory Visit: Payer: Medicare Other | Admitting: Allergy & Immunology

## 2021-11-20 VITALS — BP 106/62 | HR 71 | Temp 97.9°F | Resp 16

## 2021-11-20 DIAGNOSIS — K047 Periapical abscess without sinus: Secondary | ICD-10-CM | POA: Diagnosis not present

## 2021-11-20 DIAGNOSIS — R0902 Hypoxemia: Secondary | ICD-10-CM | POA: Diagnosis not present

## 2021-11-20 DIAGNOSIS — R634 Abnormal weight loss: Secondary | ICD-10-CM

## 2021-11-20 DIAGNOSIS — T63481D Toxic effect of venom of other arthropod, accidental (unintentional), subsequent encounter: Secondary | ICD-10-CM

## 2021-11-20 DIAGNOSIS — J310 Chronic rhinitis: Secondary | ICD-10-CM

## 2021-11-20 DIAGNOSIS — R109 Unspecified abdominal pain: Secondary | ICD-10-CM | POA: Diagnosis not present

## 2021-11-20 DIAGNOSIS — J3089 Other allergic rhinitis: Secondary | ICD-10-CM | POA: Diagnosis not present

## 2021-11-20 MED ORDER — AMOXICILLIN-POT CLAVULANATE 875-125 MG PO TABS: 1.0000 | ORAL_TABLET | Freq: Two times a day (BID) | ORAL | 0 refills | Status: AC

## 2021-11-20 MED ORDER — TRIAMCINOLONE ACETONIDE 0.1 % EX OINT: 1.0000 "application " | TOPICAL_OINTMENT | Freq: Two times a day (BID) | CUTANEOUS | 1 refills | Status: DC

## 2021-11-20 NOTE — Patient Instructions (Addendum)
1. Concern for insect bites. - There is no testing for midges to my knowledge. - We will send in a tube of triamcinolone to use as needed.  - Continue with the Allegra daily. - Definitely call the pest control people you use. - Continue with the vinegar traps.   2. Chronic rhinitis - with negative blood testing - Stop the NeilMed bottle and be more vigilant with the nasal ipratropium.  - Continue with Allegra one tablet daily.  3. Hypoxia (low oxygen level) - It seems that that you are under control with Dr. Lamonte Sakai.  4. Low weight/failure to thrive - We are going to refer you to see Kieth Brightly, a Registered Dietician that works here in [place].  - She might have some ideas for improving your weight gain as well.  5. Concern for left sided dental abscess - Start Augmentin twice daily for 7 days. - If you are not feeling better in a few days, call one of your dentists.    6. Return in about 6 months (around 05/20/2022).    Please inform us of any Emergency Department visits, hospitalizations, or changes in symptoms. Call us before going to the ED for breathing or allergy symptoms since we might be able to fit you in for a sick visit. Feel free to contact us anytime with any questions, problems, or concerns.  It was a pleasure to see you today! I am so sorry that all of this is happening to you.  Websites that have reliable patient information: 1. American Academy of Asthma, Allergy, and Immunology: www.aaaai.org 2. Food Allergy Research and Education (FARE): foodallergy.org 3. Mothers of Asthmatics: http://www.asthmacommunitynetwork.org 4. American College of Allergy, Asthma, and Immunology: www.acaai.org  Like [place] on [place] and Instagram for our latest updates!       Make sure you are registered to vote! If you have moved or changed any of your contact information, you will need to get this updated before voting!  In some cases, you MAY be able to register to vote online:  [email or web address]

## 2021-11-20 NOTE — Progress Notes (Signed)
FOLLOW UP  Date of Service/Encounter:  11/20/21   Assessment:   SOB (shortness of breath) - now with daytime hypoxia   Chronic rhinitis    Headaches - followed by Dr. Jaynee Eagles  Abdominal pain - followed by Dr. Abbey Chatters and NP Roseanne Kaufman   Concern for insect allergy (midges) - recommended again that she contact pest control  Likely dental abscess - started Augmentin but recommended that she contact one of dentists if this continues to be a problem  Plan/Recommendations:   1. Concern for insect bites. - There is no testing for midges to my knowledge. - We will send in a tube of triamcinolone to use as needed.  - Continue with the Allegra daily. - Definitely call the pest control people you use. - Continue with the vinegar traps.   2. Chronic rhinitis - with negative blood testing - Stop the NeilMed bottle and be more vigilant with the nasal ipratropium.  - Continue with Allegra one tablet daily.  3. Hypoxia (low oxygen level) - It seems that that you are under control with Dr. Lamonte Sakai.  4. Low weight/failure to thrive - We are going to refer you to see Kieth Brightly, a Registered Dietician that works here in [place].  - She might have some ideas for improving your weight gain as well.  5. Concern for left sided dental abscess - Start Augmentin twice daily for 7 days. - If you are not feeling better in a few days, call one of your dentists.    6. Return in about 6 months (around 05/20/2022).    Subjective:   Carol Hardin is a 88 y.o. female presenting today for follow up of  Chief Complaint  Patient presents with   Allergic Rhinitis     AARINI SLEE has a history of the following: Patient Active Problem List   Diagnosis Date Noted   Frequent stools 03/24/2021   Abdominal pain 03/24/2021   Loss of weight 03/24/2021   Osteoporosis 08/26/2020   History of revision of total replacement of right hip joint 08/26/2020   Hemorrhoids 08/26/2020   Fracture of  proximal phalanx of finger 07/02/2020   Disorder of skeletal muscle 01/07/2020   Muscular deconditioning 01/07/2020   Dysphagia 03/30/2019   Constipation 03/30/2019   Chronic hypoxemic respiratory failure ([place])    Oxygen dependent    Cardiac pacemaker in situ 01/23/2019   Sick sinus syndrome ([employer]) 01/22/2019   CKD (chronic kidney disease) stage 3, GFR 30-59 ml/min (HCC) 01/21/2019   Acute on chronic systolic heart failure ([place]) 12/25/2018   Tachy-brady syndrome ([place]) 12/25/2018   Atrial fibrillation, chronic (HCC) 12/24/2018   Congestive heart failure ([place]) 12/24/2018   Chronic atrial fibrillation ([place]) 12/24/2018   Malnutrition, calorie ([place]) 11/20/2018   Atrial fibrillation with rapid ventricular response ([place]) 11/18/2018   Community acquired pneumonia 11/18/2018   Acute on chronic diastolic heart failure ([place]) 11/18/2018   Trochanteric bursitis of left hip 05/23/2018   Stroke (cerebrum) ([place]) 05/19/2018   Hip fracture ([place]) 12/31/2017   Foreign body of both ears 07/01/2017   Presbycusis of both ears 07/01/2017   PAF (paroxysmal atrial fibrillation) (HCC)    Nonsustained ventricular tachycardia    Mitral valve regurgitation    Ventricular premature beats    Bradycardia    Essential hypertension    Hypokalemia    Diverticulitis 08/09/2016   Gastroesophageal reflux disease 07/26/2016   Globus pharyngeus 07/26/2016   Hoarseness 07/26/2016   Diverticulosis of colon 04/22/2016   Diverticulosis  of large intestine 04/22/2016   Generalized anxiety disorder 04/22/2016   Irritable bowel syndrome 04/22/2016   Mitral valve prolapse 04/22/2016   Mixed hyperlipidemia 04/22/2016   Osteopenia 04/22/2016   Diverticulitis of large intestine 04/22/2016   Low blood pressure 09/24/2014   Hypercholesterolemia    History of depression    Dyspnea on exertion 05/31/2008    History obtained from: chart review and patient.  [name] is a 88 y.o. female presenting for a follow up visit.  She  was last seen in November 2022.  At that time, she was concerned with midge insect bites.  We added on clobetasol foam shampoo once weekly to see if that would help.  We continue with Allegra daily.  For her rhinitis, we stopped the NeilMed bottle and recommended use of nasal ipratropium more aggressively.  She did have hypoxia.  We sent a message to Geraldo Pitter with Pulmonology about adding oxygen to her regimen.   Since the last visit, she has had a lot of instances with   Roseanne Kaufman at [facility] is trying to get in for an endoscopy in [place]. They tried to get into [facility] but they refused due to her age and cobmorbidities. Vicente Males is also looking for her to finish her dental work on the left side of her face. Right now she is likely going to need a partial dentures on the upper left side of her teeth. She has had three different dentist looking at her teeth. Dental work is going to be completed in the next few months. Evidently she is working on getting letters for pre-surgery clearance, specifically with regards to her Eliquis.   Asthma/Respiratory Symptom History: She did go to see Dr. Lamonte Sakai. She is going to continue with the oxygen 2 L at night. She did have a sleepy study that showed no apnea.   She brought in some more midges with her today under tape. She also printed some information out for me on midges and midge bites. [place]. She has an insect guy that has not come out here yet to look at this. She has been using vinegar traps with some success. She refers to them as a "silent enemy".   She has not seen a Administrator, sports and is open to that. She is using one Ensure daily. She has never increased this. This is the high protein kind. Vicente Males with GI has her on Metamucil for digestion.   She has a lot of questions about her cardiac medications. She had a Pharmacist call her about the lisinopril and carvedilol and a conflict.   Otherwise,  there have been no changes to her past medical history, surgical history, family history, or social history.    Review of Systems  Constitutional: Negative.  Negative for chills, fever, malaise/fatigue and weight loss.  HENT: Negative.  Negative for congestion, ear discharge and ear pain.        Positive for nasal drainage.  Eyes:  Negative for pain, discharge and redness.  Respiratory:  Negative for cough, sputum production, shortness of breath and wheezing.   Cardiovascular: Negative.  Negative for chest pain and palpitations.  Gastrointestinal:  Negative for abdominal pain, constipation, diarrhea, heartburn, nausea and vomiting.  Skin:  Positive for itching and rash.  Neurological:  Negative for dizziness and headaches.  Endo/Heme/Allergies:  Negative for environmental allergies. Does not bruise/bleed easily.      Objective:   Blood pressure 106/62, pulse 71,  temperature 97.9 F (36.6 C), temperature source Temporal, resp. rate 16, SpO2 94 %. There is no height or weight on file to calculate BMI.   Physical Exam Vitals reviewed.  Constitutional:      Appearance: She is well-developed.     Comments: Very talkative.  Tearful at times.  HENT:     Head: Normocephalic and atraumatic.     Right Ear: Tympanic membrane, ear canal and external ear normal.     Left Ear: Tympanic membrane, ear canal and external ear normal.     Nose: Rhinorrhea present. No nasal deformity, septal deviation or mucosal edema.     Right Turbinates: Enlarged. Not swollen.     Left Turbinates: Enlarged. Not swollen.     Right Sinus: No maxillary sinus tenderness or frontal sinus tenderness.     Left Sinus: No maxillary sinus tenderness or frontal sinus tenderness.     Mouth/Throat:     Mouth: Mucous membranes are not pale and not dry.     Pharynx: Uvula midline.  Eyes:     General: Lids are normal. Allergic shiner present.        Right eye: No discharge.        Left eye: No discharge.      Conjunctiva/sclera: Conjunctivae normal.     Right eye: Right conjunctiva is not injected. No chemosis.    Left eye: Left conjunctiva is not injected. No chemosis.    Pupils: Pupils are equal, round, and reactive to light.  Cardiovascular:     Rate and Rhythm: Normal rate and regular rhythm.     Heart sounds: Normal heart sounds.  Pulmonary:     Effort: Pulmonary effort is normal. No tachypnea, accessory muscle usage or respiratory distress.     Breath sounds: Normal breath sounds. No wheezing, rhonchi or rales.     Comments: Moving air well in all lung fields. There are no crackles or wheezes noted. Chest:     Chest wall: No tenderness.  Lymphadenopathy:     Cervical: No cervical adenopathy.  Skin:    General: Skin is warm.     Capillary Refill: Capillary refill takes less than 2 seconds.     Coloration: Skin is not pale.     Findings: No abrasion, erythema, petechiae or rash. Rash is not papular, urticarial or vesicular.  Neurological:     Mental Status: She is alert.  Psychiatric:        Behavior: Behavior is cooperative.     Diagnostic studies: none      Salvatore Marvel, MD  [facility]

## 2021-11-23 ENCOUNTER — Other Ambulatory Visit: Payer: Self-pay | Admitting: *Deleted

## 2021-11-23 DIAGNOSIS — R1013 Epigastric pain: Secondary | ICD-10-CM

## 2021-11-23 DIAGNOSIS — R6881 Early satiety: Secondary | ICD-10-CM

## 2021-11-23 DIAGNOSIS — R140 Abdominal distension (gaseous): Secondary | ICD-10-CM

## 2021-11-23 DIAGNOSIS — R634 Abnormal weight loss: Secondary | ICD-10-CM

## 2021-11-23 NOTE — Telephone Encounter (Signed)
[facility] clinical pool: can we reopen that referral to [facility] for EGD? History of postprandial bloating, pressure, early satiety. No abdominal pain. Weight loss noted. CTA without chronic mesenteric ischemia. Negative celiac serologies. Gallbladder present but does not appear to be biliary related. I had recommended EGD, but she is not a candidate for anesthesia here at [facility] with low EF. Her cardiologist has cleared her for a procedure as appropriate.   Can we also arrange a GES while we are waiting on that? Thanks! Reason: early satiety, bloating.

## 2021-11-24 ENCOUNTER — Encounter: Payer: Medicare Other | Admitting: Student

## 2021-11-28 ENCOUNTER — Other Ambulatory Visit: Payer: Self-pay | Admitting: Allergy & Immunology

## 2021-12-01 DIAGNOSIS — H2511 Age-related nuclear cataract, right eye: Secondary | ICD-10-CM | POA: Diagnosis not present

## 2021-12-01 DIAGNOSIS — Z961 Presence of intraocular lens: Secondary | ICD-10-CM | POA: Diagnosis not present

## 2021-12-01 NOTE — Telephone Encounter (Signed)
PA approved via [facility] website. Auth# [id number], DOS 12/01/2021-01/15/2022

## 2021-12-01 NOTE — Addendum Note (Signed)
Addended by: Cheron Every on: 12/01/2021 11:30 AM   Modules accepted: Orders

## 2021-12-04 NOTE — Progress Notes (Addendum)
NEUROLOGY CONSULT NOTE  Referring provider:  Celene Squibb, MD PCP:  Celene Squibb, MD  [name] [phone]  Assessment/Plan:   Chronic daily headache  Stop topiramate.  Will have her start gabapentin 100mg  at bedtime for one week, then 100mg  twice daily.  Would not use a TCA as she has a prolonged QTc interval. Limit use of pain relievers to no more than 2 days out of week to prevent risk of rebound or medication-overuse headache. Keep headache diary Follow up 4 months.  Subjective:  Carol Hardin is an 88 year old female with PAF, cardiomyopathy, bradycardia, CKD, depression, anxiety and history of stroke who presents for headaches.  History supplemented by prior neurologist's notes and referring provider's note.  She began having headaches around 2018 when she moved to [place].  She will get a severe mixed pressure/pounding headache across forehead and vertex.  Wakes up with it and it dissipates at end of day.  No associated nausea, vomiting, photophobia, phonophobia, unilateral numbness or weakness.  She underwent a sleep study in 2022 which did not reveal sleep apnea but did demonstrate overnight hypoxemia, suggesting that she may need to wear overnight O2.  She was started on topiramate.  There was question whether she has actually been wearing her oxygen overnight.  She wore an overnight oximetry which was normal.  She was prescribed Fioricet which helps but was told by her previous neurologist not to take it.  She started drinking caffeine every morning but was told by her cardiologist to stop due to her tachycardia.  CT head on 02/16/2021 personally reviewed showed chronic small vessel disease but no acute findings.    Current NSAIDS/analgesics:  Tylenol (ineffective) Current triptans:  none Current ergotamine:  none Current anti-emetic:  none Current muscle relaxants:  none Current Antihypertensive medications:  carvedilol, lisinopril, furosemide Current  Antidepressant medications:  none Current Anticonvulsant medications:  topiramate 50mg  at bedtime Current anti-CGRP:  none Current Vitamins/Herbal/Supplements:  none Current Antihistamines/Decongestants:  Allegra Other therapy:  none Hormone/birth control:  none Other medications:  Eliquis, lorazepam  Past NSAIDS/analgesics:  Fioricet (effective) Past abortive triptans:  none Past abortive ergotamine:  none Past muscle relaxants:  none Past anti-emetic:  none Past antihypertensive medications:  none Past antidepressant medications:  none Past anticonvulsant medications:  gabapentin Past anti-CGRP:   none Past vitamins/Herbal/Supplements:  none Past antihistamines/decongestants:  none Other past therapies:  Biofreeze, ice packs     PAST MEDICAL HISTORY: Past Medical History:  Diagnosis Date   Anxiety    Arthritis    "fingers, right toe" (08/09/2016)   Atrial fibrillation (HCC)    Basal cell carcinoma of lower leg, right    Bradycardia    a. Holter 08/30/16 showed profound bradycardia down to 30 during awake hours, several 2 second pauses, very frequent PVCs >8000 in 48 hours, and NSVT (longest of 14 beats).   Cardiomyopathy ([place])    a. EF 40% by echo 08/2016.   Daily headache    "I usually wake up w/a headache; sometimes it's a migraine" (08/09/2016)   Depression    Diverticulosis    Hx. of   Frequent PVCs    GERD (gastroesophageal reflux disease)    Hypercholesterolemia    Migraine    Mitral regurgitation    MVP (mitral valve prolapse)    NSVT (nonsustained ventricular tachycardia)    a. first noted event monitor 08/2016.   Osteoporosis    PAF (paroxysmal atrial fibrillation) ([place])    a.  in afib at time of echo 08/2016.   Pneumonia    Scoliosis    mild   Squamous cell carcinoma of neck    Stroke ([place]) 05/2018    MEDICATIONS: Current Outpatient Medications on File Prior to Visit  Medication Sig Dispense Refill   acetaminophen (TYLENOL) 325 MG tablet Take  325-975 mg by mouth daily.     apixaban (ELIQUIS) 2.5 MG TABS tablet TAKE 1 TABLET(2.5 MG) BY MOUTH TWICE DAILY 60 tablet 0   bismuth subsalicylate (PEPTO BISMOL) 262 MG/15ML suspension Take 30 mLs by mouth as needed.     carvedilol (COREG) 3.125 MG tablet Take 1 tablet (3.125 mg total) by mouth 2 (two) times daily. 180 tablet 3   carvedilol (COREG) 3.125 MG tablet Take 3.125 mg by mouth 2 (two) times daily with a meal.     Ensure (ENSURE) Take 237 mLs by mouth daily.      fexofenadine (ALLEGRA) 60 MG tablet Take 1 tablet (60 mg total) by mouth daily. 30 tablet 5   furosemide (LASIX) 20 MG tablet Take 20 mg by mouth as needed for fluid.     lisinopril (ZESTRIL) 2.5 MG tablet TAKE 1 TABLET(2.5 MG) BY MOUTH DAILY 90 tablet 0   LORazepam (ATIVAN) 1 MG tablet Take 1 mg by mouth at bedtime.     oxybutynin (DITROPAN-XL) 10 MG 24 hr tablet Take 10 mg by mouth daily.     Polyethyl Glycol-Propyl Glycol (SYSTANE OP) Apply to eye as needed.     topiramate (TOPAMAX) 25 MG tablet Take 2 tablets (50 mg total) by mouth at bedtime. (Patient taking differently: Take 50 mg by mouth every evening.) 60 tablet 3   triamcinolone ointment (KENALOG) 0.1 % Apply 1 application topically 2 (two) times daily. 454 g 1   No current facility-administered medications on file prior to visit.    ALLERGIES: Allergies  Allergen Reactions   Omeprazole Other (See Comments)    siezure   Flagyl [Metronidazole] Nausea And Vomiting   Amiodarone    Aripiprazole Other (See Comments)    Unknown reaction   Hylan G-F 20 Other (See Comments)    Unknown reaction   Metoprolol Tartrate    Paroxetine    Paroxetine Hcl Other (See Comments)    Headaches (a long time ago- pt doesn't really remember)   Statins Other (See Comments)    No specific reaction given-patient states that she was advised not to take by physician   Sulfa Antibiotics Diarrhea and Other (See Comments)    Colitis    Toprol Xl [Metoprolol Succinate] Other (See  Comments)    Dizziness--pt doesn't really remember    Vancomycin Itching and Other (See Comments)    Pt reports that they gave it too fast- she started having itching in the scalp   Penicillins Rash    DID THE REACTION INVOLVE: Swelling of the face/tongue/throat, SOB, or low BP? No Sudden or severe rash/hives, skin peeling, or the inside of the mouth or nose? Yes Did it require medical treatment? Unknown When did it last happen?  Over 10 years     If all above answers are NO, may proceed with cephalosporin use.     FAMILY HISTORY: Family History  Problem Relation Age of Onset   Heart failure Mother    Leukemia Mother    Heart failure Father    COPD Father    Alzheimer's disease Brother    Heart Problems Brother        stent  Heart Problems Son        stent    Allergic rhinitis Neg Hx    Angioedema Neg Hx    Asthma Neg Hx    Eczema Neg Hx    Atopy Neg Hx    Immunodeficiency Neg Hx    Urticaria Neg Hx    Headache Neg Hx       Objective:  Blood pressure 103/62, pulse 78, height 5\' 3"  (1.6 m), weight 116 lb 12.8 oz (53 kg), SpO2 96 %. General: No acute distress.  Patient appears well-groomed.   Head:  Normocephalic/atraumatic Eyes:  Fundi examined but not visualized Neck: supple, mild paraspinal tenderness, full range of motion Neurological Exam: alert and oriented to person, place, and time.  Speech fluent and not dysarthric, language intact.  CN II-XII intact. Bulk and tone normal, muscle strength 5/5 throughout.  Sensation to light touch intact.  Deep tendon reflexes 2+ throughout, toes downgoing.  Finger to nose testing intact.  Gait normal, Romberg negative.   Metta Clines, DO  CC: Allyn Kenner, MD

## 2021-12-07 ENCOUNTER — Encounter: Payer: Self-pay | Admitting: Neurology

## 2021-12-07 ENCOUNTER — Other Ambulatory Visit: Payer: Self-pay

## 2021-12-07 ENCOUNTER — Ambulatory Visit: Payer: Medicare Other | Admitting: Neurology

## 2021-12-07 VITALS — BP 103/62 | HR 78 | Ht 63.0 in | Wt 116.8 lb

## 2021-12-07 DIAGNOSIS — R519 Headache, unspecified: Secondary | ICD-10-CM

## 2021-12-07 MED ORDER — GABAPENTIN 100 MG PO CAPS: ORAL_CAPSULE | ORAL | 0 refills | Status: DC

## 2021-12-07 NOTE — Patient Instructions (Signed)
Start gabapentin 100mg .  Take 1 pill at night for one week, then increase to 1 pill twice daily.  If no improvement in 4 weeks, contact me and we can increase dose Limit use of pain relievers to no more than 2 days out of week to prevent risk of rebound or medication-overuse headache. Keep headache diary Follow up 4 months.

## 2021-12-08 ENCOUNTER — Other Ambulatory Visit (HOSPITAL_COMMUNITY): Payer: Medicare Other

## 2021-12-11 DIAGNOSIS — I5020 Unspecified systolic (congestive) heart failure: Secondary | ICD-10-CM | POA: Diagnosis not present

## 2021-12-11 DIAGNOSIS — R0600 Dyspnea, unspecified: Secondary | ICD-10-CM | POA: Diagnosis not present

## 2021-12-11 DIAGNOSIS — R350 Frequency of micturition: Secondary | ICD-10-CM | POA: Diagnosis not present

## 2021-12-11 DIAGNOSIS — R0902 Hypoxemia: Secondary | ICD-10-CM | POA: Diagnosis not present

## 2021-12-11 DIAGNOSIS — R0602 Shortness of breath: Secondary | ICD-10-CM | POA: Diagnosis not present

## 2021-12-14 ENCOUNTER — Other Ambulatory Visit: Payer: Self-pay

## 2021-12-14 ENCOUNTER — Encounter (HOSPITAL_COMMUNITY): Payer: Self-pay

## 2021-12-14 ENCOUNTER — Emergency Department (HOSPITAL_COMMUNITY)
Admission: EM | Admit: 2021-12-14 | Discharge: 2021-12-14 | Disposition: A | Discharge status: Home / Self Care | Payer: Medicare Other | Attending: Emergency Medicine | Admitting: Emergency Medicine

## 2021-12-14 DIAGNOSIS — Z7901 Long term (current) use of anticoagulants: Secondary | ICD-10-CM | POA: Diagnosis not present

## 2021-12-14 DIAGNOSIS — K0889 Other specified disorders of teeth and supporting structures: Secondary | ICD-10-CM | POA: Insufficient documentation

## 2021-12-14 MED ORDER — ACETAMINOPHEN ER 650 MG PO TBCR: 650.0000 mg | EXTENDED_RELEASE_TABLET | Freq: Three times a day (TID) | ORAL | 0 refills | Status: DC | PRN

## 2021-12-14 MED ORDER — ACETAMINOPHEN 325 MG PO TABS
650.0000 mg | ORAL_TABLET | Freq: Once | ORAL | Status: AC
  Administered 2021-12-14: 650 mg via ORAL
  Filled 2021-12-14: qty 2

## 2021-12-14 NOTE — Telephone Encounter (Addendum)
Please advise on holding Eliquis prior to 3 teeth being extracted.  Procedure has not yet been scheduled.  Thank you. ? ? ? ? ?

## 2021-12-14 NOTE — Telephone Encounter (Signed)
Pt walks in to the office this morning because clearance that was faxed stated that she was having 1-2 teeth extracted but pt needs to have 3 teeth extracted and clearance needs to state this. Will re-route to pre-op pool. ?

## 2021-12-14 NOTE — Discharge Instructions (Signed)
It was a pleasure taking care of you today. As discussed, there was no obvious abscess on exam. Continue taking your antibiotics. Call the oral surgeon tomorrow to schedule an appointment for further evaluation. I am sending you home with tylenol. Take as needed for pain. Return to the ER for new or worsening symptoms.  ?

## 2021-12-14 NOTE — ED Triage Notes (Addendum)
POV from home. Cc of left upper dental pain. States she has a "loose bridge" since January. Went to the dentist today but they didn't do anything that they were going to refer her to an oral surgeon. Has been on clinda.  ?

## 2021-12-14 NOTE — ED Provider Notes (Signed)
?[facility] ?Provider Note ? ? ?CSN: [id number] ?Arrival date & time: 12/14/21  1905 ? ?  ? ?History ? ?Chief Complaint  ?Patient presents with  ? Dental Pain  ? ? ?Carol Hardin is a 88 y.o. female who presents to the ED due to left upper dental pain.  Patient evaluated by her dentist earlier today and was referred to an oral surgeon for further evaluation.  Patient reports to the ED due to continued pain.  Patient states pain has been ongoing since January.  She has been on numerous rounds of antibiotics.  She is currently on clindamycin.  Denies any facial edema.  No changes to phonation or trismus.  Denies difficulty swallowing or breathing.  No fever or chills. ? ? ? ?  ? ?Home Medications ?Prior to Admission medications   ?Medication Sig Start Date End Date Taking? Authorizing Provider  ?acetaminophen (TYLENOL) 325 MG tablet Take 325-975 mg by mouth daily.    [provider]  ?apixaban (ELIQUIS) 2.5 MG TABS tablet TAKE 1 TABLET(2.5 MG) BY MOUTH TWICE DAILY 08/31/21   O'Neal, Cassie Freer, MD  ?bismuth subsalicylate (PEPTO BISMOL) 262 MG/15ML suspension Take 30 mLs by mouth as needed.    [provider]  ?carvedilol (COREG) 3.125 MG tablet Take 1 tablet (3.125 mg total) by mouth 2 (two) times daily. 03/30/21 08/10/21  O'NealCassie Freer, MD  ?carvedilol (COREG) 3.125 MG tablet Take 3.125 mg by mouth 2 (two) times daily with a meal.    [provider]  ?Ensure (ENSURE) Take 237 mLs by mouth daily.     [provider]  ?fexofenadine (ALLEGRA) 60 MG tablet Take 1 tablet (60 mg total) by mouth daily. 05/18/21   Valentina Shaggy, MD  ?furosemide (LASIX) 20 MG tablet Take 20 mg by mouth as needed for fluid.    [provider]  ?gabapentin (NEURONTIN) 100 MG capsule Take 1 capsule at night for 7 days, then 1 capsule twice daily 12/07/21   Pieter Partridge, DO  ?lisinopril (ZESTRIL) 2.5 MG tablet TAKE 1 TABLET(2.5 MG) BY MOUTH DAILY 10/20/21   Allred,  Jeneen Rinks, MD  ?LORazepam (ATIVAN) 1 MG tablet Take 1 mg by mouth at bedtime.    [provider]  ?oxybutynin (DITROPAN-XL) 10 MG 24 hr tablet Take 10 mg by mouth daily. 10/22/21   [provider]  ?Polyethyl Glycol-Propyl Glycol (SYSTANE OP) Apply to eye as needed.    [provider]  ?triamcinolone ointment (KENALOG) 0.1 % Apply 1 application topically 2 (two) times daily. 11/20/21   Valentina Shaggy, MD  ?   ? ?Allergies    ?Omeprazole, Flagyl [metronidazole], Amiodarone, Aripiprazole, Hylan g-f 20, Metoprolol tartrate, Paroxetine, Paroxetine hcl, Statins, Sulfa antibiotics, Toprol xl [metoprolol succinate], Vancomycin, and Penicillins   ? ?Review of Systems   ?Review of Systems  ?Constitutional:  Negative for fever.  ?HENT:  Positive for dental problem. Negative for trouble swallowing and voice change.   ? ?Physical Exam ?Updated Vital Signs ?BP 102/65 (BP Location: Right Arm)   Pulse 69   Temp 97.8 ?F (36.6 ?C) (Oral)   Ht '5\' 3"'$  (1.6 m)   Wt 53 kg   SpO2 97%   BMI 20.69 kg/m?  ?Physical Exam ?Vitals and nursing note reviewed.  ?Constitutional:   ?   General: She is not in acute distress. ?   Appearance: She is not ill-appearing.  ?HENT:  ?   Head: Normocephalic.  ?   Mouth/Throat:  ?  Comments: Dentures in. Tenderness to left upper gum line. No obvious abscess.  Tongue in normal position without protrusion.  No trismus.  Normal phonation.  No tenderness below tongue. ?Eyes:  ?   Pupils: Pupils are equal, round, and reactive to light.  ?Cardiovascular:  ?   Rate and Rhythm: Normal rate and regular rhythm.  ?   Pulses: Normal pulses.  ?   Heart sounds: Normal heart sounds. No murmur heard. ?  No friction rub. No gallop.  ?Pulmonary:  ?   Effort: Pulmonary effort is normal.  ?   Breath sounds: Normal breath sounds.  ?Abdominal:  ?   General: Abdomen is flat. There is no distension.  ?   Palpations: Abdomen is soft.  ?   Tenderness: There is no abdominal tenderness. There is no  guarding or rebound.  ?Musculoskeletal:     ?   General: Normal range of motion.  ?   Cervical back: Neck supple.  ?Skin: ?   General: Skin is warm and dry.  ?Neurological:  ?   General: No focal deficit present.  ?   Mental Status: She is alert.  ?Psychiatric:     ?   Mood and Affect: Mood normal.     ?   Behavior: Behavior normal.  ? ? ?ED Results / Procedures / Treatments   ?Labs ?(all labs ordered are listed, but only abnormal results are displayed) ?Labs Reviewed - No data to display ? ?EKG ?None ? ?Radiology ?No results found. ? ?Procedures ?Procedures  ? ? ?Medications Ordered in ED ?Medications  ?acetaminophen (TYLENOL) tablet 650 mg (has no administration in time range)  ? ? ?ED Course/ Medical Decision Making/ A&P ?  ?                        ?Medical Decision Making ?Amount and/or Complexity of Data Reviewed ?Independent Historian: spouse ?   Details: husband at bedside provided history ? ?Risk ?OTC drugs. ? ? ?88 year old female presents to the ED due to persistent left upper dental pain that has been ongoing since January.  Patient evaluated numerous times by her dentist most recently today. Currently on Clindamycin. Patient referred to an oral surgeon for further evaluation.  No difficulties breathing or swallowing.  Denies fever and chills.  Tenderness throughout left upper gumline.  Bridges in place. No obvious abscess.  Patient tolerating oral secretions without difficulty.  Tongue in normal position without protrusion.  No facial edema.  Low suspicion for Ludwigs or deep space infection.  Patient given Tylenol here in the ED.  Advised patient to follow-up with the oral surgeon for further evaluation. Strict ED precautions discussed with patient. Patient states understanding and agrees to plan. Patient discharged home in no acute distress and stable vitals ? ? ? ? ? ? ? ? ?Final Clinical Impression(s) / ED Diagnoses ?Final diagnoses:  ?None  ? ? ?Rx / DC Orders ?ED Discharge Orders   ? ? None  ? ?   ? ? ?  ?Suzy Bouchard, PA-C ?12/14/21 2226 ? ?  ?Varney Biles, MD ?12/15/21 1605 ? ?

## 2021-12-15 DIAGNOSIS — I700 Atherosclerosis of aorta: Secondary | ICD-10-CM | POA: Insufficient documentation

## 2021-12-15 NOTE — Telephone Encounter (Signed)
Patient with diagnosis of afib on Eliquis for anticoagulation.   ? ?Procedure: 3 dental extractions ?Date of procedure: TBD ? ?CHA2DS2-VASc Score = 8  ?This indicates a 10.8% annual risk of stroke. ?The patient's score is based upon: ?CHF History: 1 ?HTN History: 1 ?Diabetes History: 0 ?Stroke History: 2 ?Vascular Disease History: 1 ?Age Score: 2 ?Gender Score: 1 ? ?CrCl 27m/min ?Platelet count 179K ? ?Patient does not require pre-op antibiotics for dental procedure. ? ?Per office protocol, patient can hold Eliquis for 1 day prior to procedure. She should resume as soon as safely possible after given her elevated CV risk. ?

## 2021-12-16 ENCOUNTER — Other Ambulatory Visit: Payer: Self-pay

## 2021-12-16 ENCOUNTER — Encounter: Payer: Medicare Other | Attending: Allergy & Immunology | Admitting: Nutrition

## 2021-12-16 VITALS — Ht 63.0 in | Wt 115.6 lb

## 2021-12-16 DIAGNOSIS — E46 Unspecified protein-calorie malnutrition: Secondary | ICD-10-CM | POA: Insufficient documentation

## 2021-12-16 DIAGNOSIS — N183 Chronic kidney disease, stage 3 unspecified: Secondary | ICD-10-CM | POA: Insufficient documentation

## 2021-12-16 DIAGNOSIS — R634 Abnormal weight loss: Secondary | ICD-10-CM | POA: Insufficient documentation

## 2021-12-16 NOTE — Progress Notes (Incomplete)
Medical Nutrition Therapy  Appointment Start time:  1100  Appointment End time:  1215  Primary concerns today: Weight loss  Referral diagnosis:  Preferred learning style: no preference  Learning readiness: Ready    NUTRITION ASSESSMENT   88 yr old female that has lost about 15+ lbs in the last 2-3  years. Currently having dental issues. Eats 3 meals per day and gram crackers for snacks sometimes between meals and occasional milkshake at night.. Tends to stay up late at night and sleep in til mid morning daily. Is having some GI issues of bloating. Scheduled for a Gastric emptying study soon. Is on antibiotic for dental issues. Has had stomach issues with taking it. Her husband cooks and shops mostly.  She notes she is tired and has little energy.  She is cachetic in appearance. Very bony body parts. Bitemporal wasting and no visual body fat. Skin is pale with poor tugor. Eyes are sunken in.  She appears very frail. Limited balanced as she was holding on to walls while walking. Not using a cane or walker at present. Denies falls. She feels her stomach is bloated and swollen. Complains of her feet swelling and turning read at times. History of heart issues. Avoids salt as best as she can to avoid CHF.  Denies swallowing issues. Is constipated at times and takes metamucil daily.  Current diet is insuffient to meet her needs nutritionally contributing to slow gradual weight loss and moderate protein calorie malnutrition.   Anthropometrics  Wt Readings from Last 3 Encounters:  12/16/21 115 lb 9.6 oz (52.4 kg)  12/14/21 116 lb 12.8 oz (53 kg)  12/07/21 116 lb 12.8 oz (53 kg)   Ht Readings from Last 3 Encounters:  12/16/21 '5\' 3"'$  (1.6 m)  12/14/21 '5\' 3"'$  (1.6 m)  12/07/21 '5\' 3"'$  (1.6 m)   Body mass index is 20.48 kg/m. '@BMIFA'$ @ Facility age limit for growth percentiles is 20 years. Facility age limit for growth percentiles is 20 years.    Clinical Medical Hx: See chart Medications:  see chart Labs:  Lab Results  Component Value Date   HGBA1C 5.7 08/11/2020   CMP Latest Ref Rng & Units 05/07/2021 02/16/2021 12/19/2020  Glucose 65 - 99 mg/dL 83 104(H) 98  BUN 8 - 27 mg/dL '12 18 18  '$ Creatinine 0.57 - 1.00 mg/dL 0.97 0.91 0.83  Sodium 134 - 144 mmol/L 142 135 136  Potassium 3.5 - 5.2 mmol/L 4.7 4.6 4.3  Chloride 96 - 106 mmol/L 105 102 104  CO2 20 - 29 mmol/L '23 27 26  '$ Calcium 8.7 - 10.3 mg/dL 9.3 8.9 9.0  Total Protein 6.5 - 8.1 g/dL - 8.0 -  Total Bilirubin 0.3 - 1.2 mg/dL - 1.0 -  Alkaline Phos 38 - 126 U/L - 46 -  AST 15 - 41 U/L - 35 -  ALT 0 - 44 U/L - 19 -   Lipid Panel     Component Value Date/Time   CHOL 134 08/11/2020 0000   TRIG 67 08/11/2020 0000   HDL 41 08/11/2020 0000   CHOLHDL 3.3 01/23/2019 0631   VLDL 13 01/23/2019 0631   LDLCALC 79 08/11/2020 0000    Notable Signs/Symptoms: fatigue, weakness  Lifestyle & Dietary Hx LIves with her husband.   Estimated daily fluid intake: 32 oz Supplements:  Sleep: 8 hrs Stress / self-care: none Current average weekly physical activity: ADL  24-Hr Dietary Recall First Meal: 1 tsp metimulic with water, oatmeal-instant decafcoffee Snack: gramcrackers 4 bars,  juice  Second Meal: grilled cheese sandwich, chips utz NSA,  a little decaf coffee, ice tea sweet decaf Snack: maybe gram crackers or hard candy Third Meal: chicken breast fried, mashed potatoes, green beans, water Snack: milkshake-vanilla ice cream Beverages: water and tea  Estimated Energy Needs Calories: *** Carbohydrate: ***g Protein: ***g Fat: ***g   NUTRITION DIAGNOSIS  {CHL AMB NUTRITIONAL DIAGNOSIS:[id number]}   NUTRITION INTERVENTION  Nutrition education (E-1) on the following topics:  ***  Handouts Provided Include  ***  Learning Style & Readiness for Change Teaching method utilized: Visual & Auditory  Demonstrated degree of understanding via: Teach Back  Barriers to learning/adherence to lifestyle change: ***  Goals  Established by Pt ***   MONITORING & EVALUATION Dietary intake, weekly physical activity, and *** in ***.  Next Steps  Patient is to ***.

## 2021-12-16 NOTE — Patient Instructions (Addendum)
Preventing Consequences of Unhealthy Weight Loss Behaviors, Adult °Reaching and maintaining a healthy weight is important for your overall health. A healthy weight will vary from person to person. It is natural to want to lose weight quickly, using whatever methods seem fastest. However, losing weight in a healthy way is not a quick process. Instead, aim for slow, steady weight loss by making small changes and setting achievable goals. °How can unhealthy weight loss behaviors affect me? °Using unhealthy behaviors to try to lose weight can cause: °Tiredness (fatigue), low heart rate, and low blood pressure. °Imbalances in your body. These may be imbalances in: °Electrolytes. These are salts and minerals in your blood. °Chemicals. These are needed so your body can work properly. °Body fluids. Loss of fluids may lead to dehydration. °Organ damage or organ failure, especially affecting the kidneys. °Thin bones that break easily. °Loneliness or relationship problems with your friends and family. °Emotional problems, including depression and anxiety. °Changing unhealthy weight loss behaviors through lifestyle changes will improve your overall health. Maintaining a healthy weight also lowers your risk of certain conditions, such as: °Obesity. °Heart disease, high cholesterol, and high blood pressure. °Type 2 diabetes. °Breathing and sleeping disorders. °Stroke. °Osteoarthritis. This affects your joints. °Osteoporosis. This affects your bones. °Some cancers. °What can increase my risk? °Certain views or feelings about yourself and certain habits can increase your risk of unhealthy weight loss behaviors. These include: °Having depression and being overweight as an adult. °Attempting weight loss as a child or teen. °Using alcohol, drugs, or tobacco products. °What actions can I take to prevent these behaviors? °You can make certain lifestyle changes to help you lose weight in a healthy way. These include eating nutritious  foods and exercising regularly. °Nutrition ° °Eat a variety of healthy foods, including fruits and vegetables, whole grains, lean proteins, and low-fat dairy products. °Drink water instead of sugary drinks such as juice, soda, or sports drinks. °Drink enough fluid to keep your urine pale yellow. °Plan healthy, balanced meals. Work with a dietitian to make a healthy meal plan that works for you. °Limit the following: °Foods that are high in fat, salt (sodium), or sugar. These include candy, donuts, pizza, and fast foods. °Fried or heavily processed foods. °Lifestyle °Avoid these unhealthy eating habits: °Following a diet that restricts entire types of food. This may be a popular diet that promises extreme results in a short time. °Skipping meals to save calories. °Not eating anything for long periods of time (fasting). °Restricting your calories to far fewer than the number that you need to lose or maintain a healthy weight. °Taking laxative pills to make you have more frequent bowel movements. °Taking medicines to make your body lose excess fluids (diuretics). °Eating an excessive amount of food and then making yourself vomit. This is known as bingeing and purging. °Do not use any products that contain nicotine or tobacco. These products include cigarettes, chewing tobacco, and vaping devices, such as e-cigarettes. If you need help quitting, ask your health care provider. °Alcohol use °Do not drink alcohol if: °Your health care provider tells you not to drink. °You are pregnant, may be pregnant, or are planning to become pregnant. °If you drink alcohol: °Limit how much you have to: °0-1 drink a day for women. °0-2 drinks a day for men. °Know how much alcohol is in your drink. In the [place] one drink equals one 12 oz bottle of beer (355 mL), one 5 oz glass of wine (148 mL), or one 1½   oz glass of hard liquor (44 mL). °Activity ° °Avoid compulsively getting an extreme amount of exercise. °Work with a dietitian to make a  healthy exercise program. °Include different types of exercise in your exercise program, such as strengthening, aerobic, and flexibility exercises. °To maintain your weight, get at least 150 minutes of moderate-intensity exercise each week. Moderate-intensity exercise could be brisk walking or biking. °To lose a healthy amount of weight, get 60 minutes of moderate-intensity exercise each day. °Find ways to reduce stress, such as regular exercise or meditation. °Find a hobby or other activity that you enjoy to distract you from eating when you feel stressed or bored. °Where to find support °For more support, talk with: °Your health care provider or dietitian. Ask about support groups. °A mental health care provider. °Family and friends. °Where to find more information °Learn more about how to prevent complications from unhealthy weight loss behaviors from: °Centers for Disease Control and Prevention: www.cdc.gov °National Institute of Mental Health: www.nimh.nih.gov °National Eating Disorders Association: www.nationaleatingdisorders.org °Contact a health care provider if: °You often feel very tired. °You notice changes in your skin or your hair. °You faint because of dehydration or too much exercise. °You struggle to change your unhealthy weight loss behaviors on your own. °Unhealthy weight loss behaviors are affecting your daily life or your relationships. °You have signs or symptoms of an eating disorder. °You have major weight changes in a short period of time. °You feel guilty or ashamed about eating or exercising. °Summary °Using unhealthy eating behaviors to try to lose weight can cause a variety of physical and emotional problems that affect your overall health and well-being. °Aim for slow, steady weight loss by choosing healthy foods, avoiding unhealthy eating habits, and exercising regularly. °Contact your health care provider if you struggle to change your behaviors on your own or if you think that you may  have an eating disorder. °This information is not intended to replace advice given to you by your health care provider. Make sure you discuss any questions you have with your health care provider. °Document Revised: 05/12/2021 Document Reviewed: 05/12/2021 °Elsevier Patient Education © 2022 [employer] Inc. ° °

## 2021-12-18 ENCOUNTER — Telehealth: Payer: Self-pay | Admitting: Neurology

## 2021-12-18 MED ORDER — DULOXETINE HCL 30 MG PO CPEP: 30.0000 mg | ORAL_CAPSULE | Freq: Every day | ORAL | 1 refills | Status: DC

## 2021-12-18 NOTE — Telephone Encounter (Signed)
Patient called requesting a call back about a possible side effect of her gabapentin 100 MG she takes around dinner time. ? ?She said she started having migraine variants like a seizure with zigzag lines and is also forgetful. ? ?She is concerned the medication may cause the migraine variants to come back. ? ?She did also report a recent allergy to clindamycin. ? ?Patient has stopped the gabapentin until speaking with a clinical staff member. ?

## 2021-12-18 NOTE — Telephone Encounter (Signed)
Pt called an advised that pr Dr Tomi Likens  Instead of gabapentin, we start duloxetine '30mg'$  daily order placed in epic and sent to pharmacy  ?

## 2021-12-21 ENCOUNTER — Telehealth: Payer: Self-pay | Admitting: Neurology

## 2021-12-21 NOTE — Telephone Encounter (Signed)
Pt called and LM with AN. I called back and no answer.  Message states she recently had a medicine change and has concerns ?

## 2021-12-21 NOTE — Telephone Encounter (Signed)
Per Patient she feels like she can not take the Cymbalta due to the side effects.  ? ? ?Please advise. ?

## 2021-12-22 ENCOUNTER — Telehealth: Payer: Self-pay | Admitting: Neurology

## 2021-12-22 MED ORDER — SERTRALINE HCL 25 MG PO TABS: 25.0000 mg | ORAL_TABLET | Freq: Every day | ORAL | 3 refills | Status: DC

## 2021-12-22 MED ORDER — TOPIRAMATE 25 MG PO TABS: ORAL_TABLET | ORAL | 3 refills | Status: DC

## 2021-12-22 NOTE — Telephone Encounter (Signed)
Instead, start sertraline '25mg'$  daily. ?Patient wanted to have the mediation sent to the pharmacy she will research it first.  ?

## 2021-12-22 NOTE — Telephone Encounter (Signed)
Pt has a serious heart problem, so she cant take the sertraline. She read up on the medication,so she wont pick it up from pharmacy. She does need something that will help with the HA if possible.  ?

## 2021-12-22 NOTE — Telephone Encounter (Signed)
Patient wants to go back and try topiramate.  ?

## 2021-12-22 NOTE — Telephone Encounter (Signed)
Patient advised to start topiramate '25mg'$  at bedtime for two week, then increase to '50mg'$  at bedtime. ? ?Script sent to the pharmacy.  ?

## 2021-12-29 ENCOUNTER — Other Ambulatory Visit: Payer: Self-pay

## 2021-12-29 ENCOUNTER — Encounter (HOSPITAL_COMMUNITY)
Admission: RE | Admit: 2021-12-29 | Discharge: 2021-12-29 | Disposition: A | Discharge status: Home / Self Care | Payer: Medicare Other | Source: Ambulatory Visit | Attending: Gastroenterology | Admitting: Gastroenterology

## 2021-12-29 DIAGNOSIS — R6881 Early satiety: Secondary | ICD-10-CM | POA: Diagnosis not present

## 2021-12-29 DIAGNOSIS — R140 Abdominal distension (gaseous): Secondary | ICD-10-CM

## 2021-12-29 MED ORDER — TECHNETIUM TC 99M SULFUR COLLOID
2.0000 | Freq: Once | INTRAVENOUS | Status: AC | PRN
  Administered 2021-12-29: 2.2 via INTRAVENOUS

## 2021-12-30 ENCOUNTER — Encounter: Payer: Self-pay | Admitting: Nutrition

## 2021-12-30 ENCOUNTER — Telehealth: Payer: Self-pay | Admitting: Nutrition

## 2021-12-30 NOTE — Telephone Encounter (Signed)
TC to patient to follow up on tolerance foods. She notes she is eating more fruit, vegetables and beans. ?Keeping a food journal some. ?GI symptoms are doing better. Dental work to be done March 30th. Eating more whole grains and dried beans are being tolerating well. ?Gastric empty  study was done yesterday and is waiting on results. ?Wt at home has 116 lbs.  ?Denies having any gas, bloating or stomach pains since she is eating better.  Feels bettter. Husband is supportive and helping fix meals some. Will see her on 01-20-22 for her  next follow up. ?

## 2022-01-01 NOTE — Telephone Encounter (Signed)
Thanks for the update, Kieth Brightly!  ? ?Salvatore Marvel, MD ?[facility] ? ?

## 2022-01-11 DIAGNOSIS — R0600 Dyspnea, unspecified: Secondary | ICD-10-CM | POA: Diagnosis not present

## 2022-01-11 DIAGNOSIS — I5020 Unspecified systolic (congestive) heart failure: Secondary | ICD-10-CM | POA: Diagnosis not present

## 2022-01-11 DIAGNOSIS — R0602 Shortness of breath: Secondary | ICD-10-CM | POA: Diagnosis not present

## 2022-01-11 DIAGNOSIS — R0902 Hypoxemia: Secondary | ICD-10-CM | POA: Diagnosis not present

## 2022-01-14 ENCOUNTER — Other Ambulatory Visit: Payer: Self-pay | Admitting: Internal Medicine

## 2022-01-15 ENCOUNTER — Encounter: Payer: Self-pay | Admitting: Cardiovascular Disease

## 2022-01-19 DIAGNOSIS — H905 Unspecified sensorineural hearing loss: Secondary | ICD-10-CM | POA: Diagnosis not present

## 2022-01-20 ENCOUNTER — Ambulatory Visit: Payer: Medicare Other | Admitting: Nutrition

## 2022-02-04 ENCOUNTER — Ambulatory Visit: Payer: Medicare Other | Admitting: Adult Health

## 2022-02-04 ENCOUNTER — Telehealth: Payer: Self-pay | Admitting: Gastroenterology

## 2022-02-04 NOTE — Telephone Encounter (Signed)
Unfortunately we currently have a 3-4 month wait for hospital procedures for our patient's right now, due to limited access of hospital anesthesia. In this light we are only accepting patient's who have [facility] primary care if they are not already established with our practice. In this light I do not think we can accommodate her case right now. ? ?If she needs hospital procedures due to low EF, and does not have a [facility] primary care, she would be better off being referred to another practice outside of [place] that has capability to get her in sooner. Access to [facility] for anesthesia support for elective GI procedures is a problem for all practices in our city right now.   ?

## 2022-02-04 NOTE — Telephone Encounter (Signed)
Hey Dr. Havery Moros,  ? ?We have a referral from [facility] for EGD. Patient has postprandial bloating, pressure, early satiety. No abdominal pain. Weight loss noted. CTA without chronic mesenteric ischemia. Negative celiac serologies. Gallbladder present but does not appear to be biliary related. Roseanne Kaufman NP recommended EGD, but she is not a candidate for anesthesia here at [facility] with low EF. Patient has gastric emptying 3/24.  ? ?As DOD (2/13 PM), could you please review records in Epic and advise on scheduling? ? ?Thank you ?

## 2022-02-05 ENCOUNTER — Other Ambulatory Visit: Payer: Self-pay | Admitting: Neurology

## 2022-02-05 ENCOUNTER — Telehealth: Payer: Self-pay | Admitting: Neurology

## 2022-02-05 MED ORDER — DIVALPROEX SODIUM ER 250 MG PO TB24
250.0000 mg | ORAL_TABLET | Freq: Every evening | ORAL | 1 refills | Status: DC
Start: 2022-02-05 — End: 2022-04-29

## 2022-02-05 NOTE — Telephone Encounter (Signed)
Patient called to speak with clinical staff. She said she woke up with the headache today. ?

## 2022-02-05 NOTE — Telephone Encounter (Signed)
Patient said she woke up with a HA today and cant get rid of it. She is taking topiramate but she feels it isnt working.wants to know if jaffe will put her on something else. She has heard good things about nortriptyline  ?

## 2022-02-05 NOTE — Telephone Encounter (Signed)
Due to changes on her prior EKG, I do not feel comfortable starting nortriptyline.  I propose two options:  ?1.  Increase topiramate to '75mg'$  at bedtime OR  ?2.  Stop topiramate and instead start Depakote ER '250mg'$  at bedtime.  ? ?Pt stated that she would like to stop the topiramate she said that it dose not work she would like to try to the depakote ER 250 mg at bedtime  sent to [facility] on [address]  ?

## 2022-02-08 NOTE — Telephone Encounter (Signed)
Spoke with patient. Advised of recommendation. Patient will contact GI provider and let them know ?

## 2022-02-10 ENCOUNTER — Telehealth: Payer: Self-pay | Admitting: Cardiovascular Disease

## 2022-02-10 DIAGNOSIS — R0600 Dyspnea, unspecified: Secondary | ICD-10-CM | POA: Diagnosis not present

## 2022-02-10 DIAGNOSIS — R0902 Hypoxemia: Secondary | ICD-10-CM | POA: Diagnosis not present

## 2022-02-10 DIAGNOSIS — I5020 Unspecified systolic (congestive) heart failure: Secondary | ICD-10-CM | POA: Diagnosis not present

## 2022-02-10 DIAGNOSIS — R0602 Shortness of breath: Secondary | ICD-10-CM | POA: Diagnosis not present

## 2022-02-10 NOTE — Telephone Encounter (Signed)
Returned call to patient and made her aware of PharmD's recommendations. Patient will check her BP/HR before taking evening dose.  ? ?Advised patient to call back to office with any issues, questions, or concerns. Patient verbalized understanding.  ? ?

## 2022-02-10 NOTE — Telephone Encounter (Signed)
Returned call to patient who states that she took an extra dose of Carvedilol this morning, she takes Carvedilol 3.'125mg'$  twice daily- and took 2 tablets this AM. She feels fine, advised her to monitor BP/HR. Patient would like to know if she can take her evening dose this evening or if she can skip her evening dose. Advised her I will forward message to our PharmD for advice on next dose.  ? ?Patient also states she is due for follow up with Dr. Audie Box- scheduled her an appointment for 5/30 at 1:40 pm with Dr. Audie Box.  ? ? ?

## 2022-02-10 NOTE — Telephone Encounter (Signed)
She's on a very low dose of carvedilol. If her HR and BP are normal, she can take her evening dose as usual. ?

## 2022-02-10 NOTE — Telephone Encounter (Signed)
Patient made mistake and took 2 carvedilol medications this morning.  ?

## 2022-03-04 DIAGNOSIS — H01001 Unspecified blepharitis right upper eyelid: Secondary | ICD-10-CM | POA: Diagnosis not present

## 2022-03-04 DIAGNOSIS — Z961 Presence of intraocular lens: Secondary | ICD-10-CM | POA: Diagnosis not present

## 2022-03-04 DIAGNOSIS — H11153 Pinguecula, bilateral: Secondary | ICD-10-CM | POA: Diagnosis not present

## 2022-03-04 DIAGNOSIS — H25811 Combined forms of age-related cataract, right eye: Secondary | ICD-10-CM | POA: Diagnosis not present

## 2022-03-04 DIAGNOSIS — H01002 Unspecified blepharitis right lower eyelid: Secondary | ICD-10-CM | POA: Diagnosis not present

## 2022-03-04 DIAGNOSIS — H01004 Unspecified blepharitis left upper eyelid: Secondary | ICD-10-CM | POA: Diagnosis not present

## 2022-03-04 DIAGNOSIS — H01005 Unspecified blepharitis left lower eyelid: Secondary | ICD-10-CM | POA: Diagnosis not present

## 2022-03-08 NOTE — Progress Notes (Unsigned)
Cardiology Office Note:   Date:  03/09/2022  NAME:  Carol Hardin    MRN: [id number] DOB:  1933/02/26   PCP:  Celene Squibb, MD  Cardiologist:  Carlyle Dolly, MD  Electrophysiologist:  Thompson Grayer, MD   Referring MD: Celene Squibb, MD   Chief Complaint  Patient presents with   Follow-up        History of Present Illness:   Carol Hardin is a 88 y.o. female with a hx of non-ischemic CM, permanent Afib s/p CRT-P, CVA, MR who presents for follow-up.  She presents with her husband.  Overall doing well.  Denies any chest pain or trouble breathing.  Weight is stable.  Still cannot gain weight.  Blood pressure 100/60.  On Coreg and lisinopril.  Cannot tolerate other medications.  She requests to reevaluate her heart with an ultrasound.  I agree this is likely due.  She overall seems to be doing well.  No major changes symptoms.  No bleeding on Eliquis.  Her husband is Carol Hardin who also is a patient of mine date of birth 02/07/1938   Echo 06/24/2010: Report reviewed; EF 55-60% with moderate MR and PMVL prolapse  Echo 08/26/2016: Afib with RVR, EF 40%, PMVL prolapse, myxomatous valve with moderate to severe MR Echo 05/20/2018: SB, EF 40%, PMVL prolapse, with moderate to severe MR Echo 11/19/2018: EF 35-40%, PMVL prolapse, with moderate to severe MR Echo 03/05/2020: EF 20%, Severely dilated LV, LVIDD 7.0 cm, LVOT VTI 12 cm, BIV Paced, Severe MR   Problem List 1. Mitral Valve prolapse (PMVL prolapse with moderate to severe MR in 2017) 2. Systolic HF, EF 64% -non-ischemic etiology? -LVIDD 7.0 cm 3 SSS/Afib/Tachy-brady s/p CRT-P 4. Permanent Afib s/p AVN ablation  -intolerant of rhythm control agents and AVN ablation pursued 5. CVA  Past Medical History: Past Medical History:  Diagnosis Date   Anxiety    Arthritis    "fingers, right toe" (08/09/2016)   Atrial fibrillation (HCC)    Basal cell carcinoma of lower leg, right    Bradycardia    a. Holter 08/30/16 showed  profound bradycardia down to 30 during awake hours, several 2 second pauses, very frequent PVCs >8000 in 48 hours, and NSVT (longest of 14 beats).   Cardiomyopathy ([place])    a. EF 40% by echo 08/2016.   Daily headache    "I usually wake up w/a headache; sometimes it's a migraine" (08/09/2016)   Depression    Diverticulosis    Hx. of   Frequent PVCs    GERD (gastroesophageal reflux disease)    Hypercholesterolemia    Migraine    Mitral regurgitation    MVP (mitral valve prolapse)    NSVT (nonsustained ventricular tachycardia) ([place])    a. first noted event monitor 08/2016.   Osteoporosis    PAF (paroxysmal atrial fibrillation) ([place])    a. in afib at time of echo 08/2016.   Pneumonia    Scoliosis    mild   Squamous cell carcinoma of neck    Stroke ([place]) 05/2018    Past Surgical History: Past Surgical History:  Procedure Laterality Date   AV NODE ABLATION N/A 01/23/2019   Procedure: AV NODE ABLATION;  Surgeon: Thompson Grayer, MD;  Location: [facility];  Service: Cardiovascular;  Laterality: N/A;   BASAL CELL CARCINOMA EXCISION Right 1980s   RLE   BIV PACEMAKER INSERTION CRT-P N/A 01/23/2019   Procedure: BIV PACEMAKER INSERTION CRT-P;  Surgeon: Thompson Grayer, MD;  Location: [facility];  Service: Cardiovascular;  Laterality: N/A;   BLEPHAROPLASTY     BREAST BIOPSY Left 1970s   BREAST CYST ASPIRATION Left 1970s   BREAST CYST EXCISION Left 1970s   BUNIONECTOMY Bilateral    DILATION AND CURETTAGE OF UTERUS     EXCISIONAL HEMORRHOIDECTOMY  1970s   INTRAMEDULLARY (IM) NAIL INTERTROCHANTERIC Left 12/31/2017   Procedure: INTRAMEDULLARY (IM) NAIL INTERTROCHANTRIC;  Surgeon: Nicholes Stairs, MD;  Location: [facility];  Service: Orthopedics;  Laterality: Left;   KNEE ARTHROSCOPY Right 04/12/2007   KNEE ARTHROSCOPY Left 1998   SQUAMOUS CELL CARCINOMA EXCISION     "neck"   TONSILLECTOMY      Current Medications: Current Meds  Medication Sig   acetaminophen (TYLENOL)  325 MG tablet Take 325-975 mg by mouth daily.   apixaban (ELIQUIS) 2.5 MG TABS tablet TAKE 1 TABLET(2.5 MG) BY MOUTH TWICE DAILY   bismuth subsalicylate (PEPTO BISMOL) 262 MG/15ML suspension Take 30 mLs by mouth as needed.   carvedilol (COREG) 3.125 MG tablet Take 3.125 mg by mouth 2 (two) times daily with a meal.   cetirizine (ZYRTEC) 5 MG tablet Take 5 mg by mouth daily.   Ensure (ENSURE) Take 237 mLs by mouth daily.    fexofenadine (ALLEGRA) 60 MG tablet Take 1 tablet (60 mg total) by mouth daily.   furosemide (LASIX) 20 MG tablet Take 20 mg by mouth as needed for fluid.   lisinopril (ZESTRIL) 2.5 MG tablet TAKE 1 TABLET(2.5 MG) BY MOUTH DAILY   LORazepam (ATIVAN) 1 MG tablet Take 1 mg by mouth at bedtime.   Polyethyl Glycol-Propyl Glycol (SYSTANE OP) Apply to eye as needed.   triamcinolone ointment (KENALOG) 0.1 % Apply 1 application topically 2 (two) times daily.     Allergies:    Omeprazole, Flagyl [metronidazole], Amiodarone, Aripiprazole, Hylan g-f 20, Metoprolol tartrate, Paroxetine, Paroxetine hcl, Statins, Sulfa antibiotics, Toprol xl [metoprolol succinate], Vancomycin, and Penicillins   Social History: Social History   Socioeconomic History   Marital status: Married    Spouse name: Not on file   Number of children: 4   Years of education: 2.5 yrs of college   Highest education level: Not on file  Occupational History   Not on file  Tobacco Use   Smoking status: Former    Packs/day: 1.00    Years: 20.00    Pack years: 20.00    Types: Cigarettes    Quit date: 1989    Years since quitting: 34.4   Smokeless tobacco: Never  Vaping Use   Vaping Use: Never used  Substance and Sexual Activity   Alcohol use: No    Comment: quit 1995, was occasional    Drug use: Never   Sexual activity: Not Currently  Other Topics Concern   Not on file  Social History Narrative   Lives at home with spouse   Caffeine: 2 cups coffee/day   Right handed   Social Determinants of Health    Financial Resource Strain: Not on file  Food Insecurity: Not on file  Transportation Needs: Not on file  Physical Activity: Not on file  Stress: Not on file  Social Connections: Not on file     Family History: The patient's family history includes Alzheimer's disease in her brother and father; COPD in her father; Heart Problems in her brother and son; Heart failure in her father and mother; Leukemia in her mother. There is no history of Allergic rhinitis, Angioedema, Asthma, Eczema, Atopy, Immunodeficiency, Urticaria, or  Headache.  ROS:   All other ROS reviewed and negative. Pertinent positives noted in the HPI.     EKGs/Labs/Other Studies Reviewed:   The following studies were personally reviewed by me today:  TTE 03/05/2020  1. Left ventricular ejection fraction, by estimation, is 20%. The left  ventricle has severely decreased function. The left ventricle demonstrates  regional wall motion abnormalities (see scoring diagram/findings for  description). The left ventricular  internal cavity size was moderately dilated. Left ventricular diastolic  parameters are indeterminate. Elevated left atrial pressure.   2. Right ventricular systolic function is moderately reduced. The right  ventricular size is moderately enlarged. There is mildly elevated  pulmonary artery systolic pressure.   3. Left atrial size was severely dilated.   4. Right atrial size was moderately dilated.   5. The mitral valve is degenerative. Moderate to severe mitral valve  regurgitation.   6. Tricuspid valve regurgitation is moderate.   7. The aortic valve is tricuspid. Aortic valve regurgitation is mild. No  aortic stenosis is present.   8. The inferior vena cava is dilated in size with <50% respiratory  variability, suggesting right atrial pressure of 15 mmHg.   Recent Labs: 05/07/2021: BUN 12; Creatinine, Ser 0.97; Hemoglobin 12.4; Magnesium 2.0; Platelets 171; Potassium 4.7; Sodium 142   Recent Lipid  Panel    Component Value Date/Time   CHOL 134 08/11/2020 0000   TRIG 67 08/11/2020 0000   HDL 41 08/11/2020 0000   CHOLHDL 3.3 01/23/2019 0631   VLDL 13 01/23/2019 0631   LDLCALC 79 08/11/2020 0000    Physical Exam:   VS:  BP 100/60   Pulse 85   Ht '5\' 4"'$  (1.626 m)   Wt 115 lb (52.2 kg)   SpO2 97%   BMI 19.74 kg/m    Wt Readings from Last 3 Encounters:  03/09/22 115 lb (52.2 kg)  12/16/21 115 lb 9.6 oz (52.4 kg)  12/14/21 116 lb 12.8 oz (53 kg)    General: Well nourished, well developed, in no acute distress Head: Atraumatic, normal size  Eyes: PEERLA, EOMI  Neck: Supple, no JVD Endocrine: No thryomegaly Cardiac: Normal S1, S2; RRR; no murmurs, rubs, or gallops Lungs: Clear to auscultation bilaterally, no wheezing, rhonchi or rales  Abd: Soft, nontender, no hepatomegaly  Ext: No edema, pulses 2+ Musculoskeletal: No deformities, BUE and BLE strength normal and equal Skin: Warm and dry, no rashes   Neuro: Alert and oriented to person, place, time, and situation, CNII-XII grossly intact, no focal deficits  Psych: Normal mood and affect   ASSESSMENT:   GEMINI BUNTE is a 88 y.o. female who presents for the following: 1. Nonischemic cardiomyopathy ([place])   2. Permanent atrial fibrillation ([place])   3. Acquired thrombophilia ([place])     PLAN:   1. Nonischemic cardiomyopathy ([place]) -Longstanding history of a nonischemic cardiomyopathy.  She had severe mitral valve regurgitation in the past.  Does not look that bad.  I do wonder if this is the etiology.  Regardless given her advanced age not a candidate for advanced therapies.  Really we have opted for medical management.  Only able to tolerate Coreg 3.125 mg twice daily.  Also on lisinopril 2.5 mg daily.  She takes Lasix as needed.  She denies any chest pains or trouble breathing.  Overall does well.  We will repeat her echocardiogram to get a better idea what her heart function is.  She likely will not have recovered her LV  function.  Regardless we will continue with medical therapy.  Currently has CRT-P.  Not a candidate for defibrillator.  2. Permanent atrial fibrillation ([place]) 3. Acquired thrombophilia ([place]) -History of atrial fibrillation.  Status post AV node ablation with CRT-P.  Not a candidate for defibrillator therapy.  On Eliquis 2.5 mg twice daily.  = -Continue follow-up with EP.      Disposition: Return in about 6 months (around 09/09/2022).  Medication Adjustments/Labs and Tests Ordered: Current medicines are reviewed at length with the patient today.  Concerns regarding medicines are outlined above.  Orders Placed This Encounter  Procedures   ECHOCARDIOGRAM COMPLETE   No orders of the defined types were placed in this encounter.   Patient Instructions  Medication Instructions:  The current medical regimen is effective;  continue present plan and medications.  *If you need a refill on your cardiac medications before your next appointment, please call your pharmacy*   Testing/Procedures: Echocardiogram - Your physician has requested that you have an echocardiogram. Echocardiography is a painless test that uses sound waves to create images of your heart. It provides your doctor with information about the size and shape of your heart and how well your heart's chambers and valves are working. This procedure takes approximately one hour. There are no restrictions for this procedure.     Follow-Up: At [facility], you and your health needs are our priority.  As part of our continuing mission to provide you with exceptional heart care, we have created designated Provider Care Teams.  These Care Teams include your primary Cardiologist (physician) and Advanced Practice Providers (APPs -  Physician Assistants and Nurse Practitioners) who all work together to provide you with the care you need, when you need it.  We recommend signing up for the patient portal called "MyChart".  Sign up  information is provided on this After Visit Summary.  MyChart is used to connect with patients for Virtual Visits (Telemedicine).  Patients are able to view lab/test results, encounter notes, upcoming appointments, etc.  Non-urgent messages can be sent to your provider as well.   To learn more about what you can do with MyChart, go to [email or web address].    Your next appointment:   6 month(s)  The format for your next appointment:   In Person  Provider:   Eleonore Chiquito, MD         Time Spent with Patient: I have spent a total of 25 minutes with patient reviewing hospital notes, telemetry, EKGs, labs and examining the patient as well as establishing an assessment and plan that was discussed with the patient.  > 50% of time was spent in direct patient care.  Signed, Addison Naegeli. Audie Box, MD, [facility]  [address], [place], [place] [address] [phone]  03/09/2022 4:00 PM

## 2022-03-09 ENCOUNTER — Ambulatory Visit: Payer: Medicare Other | Admitting: Cardiovascular Disease

## 2022-03-09 ENCOUNTER — Encounter: Payer: Self-pay | Admitting: Cardiovascular Disease

## 2022-03-09 VITALS — BP 100/60 | HR 85 | Ht 64.0 in | Wt 115.0 lb

## 2022-03-09 DIAGNOSIS — I428 Other cardiomyopathies: Secondary | ICD-10-CM

## 2022-03-09 DIAGNOSIS — D6869 Other thrombophilia: Secondary | ICD-10-CM

## 2022-03-09 DIAGNOSIS — I4821 Permanent atrial fibrillation: Secondary | ICD-10-CM

## 2022-03-09 NOTE — Patient Instructions (Signed)
Medication Instructions:  The current medical regimen is effective;  continue present plan and medications.  *If you need a refill on your cardiac medications before your next appointment, please call your pharmacy*   Testing/Procedures: Echocardiogram - Your physician has requested that you have an echocardiogram. Echocardiography is a painless test that uses sound waves to create images of your heart. It provides your doctor with information about the size and shape of your heart and how well your heart's chambers and valves are working. This procedure takes approximately one hour. There are no restrictions for this procedure.     Follow-Up: At [facility], you and your health needs are our priority.  As part of our continuing mission to provide you with exceptional heart care, we have created designated Provider Care Teams.  These Care Teams include your primary Cardiologist (physician) and Advanced Practice Providers (APPs -  Physician Assistants and Nurse Practitioners) who all work together to provide you with the care you need, when you need it.  We recommend signing up for the patient portal called "MyChart".  Sign up information is provided on this After Visit Summary.  MyChart is used to connect with patients for Virtual Visits (Telemedicine).  Patients are able to view lab/test results, encounter notes, upcoming appointments, etc.  Non-urgent messages can be sent to your provider as well.   To learn more about what you can do with MyChart, go to [email or web address].    Your next appointment:   6 month(s)  The format for your next appointment:   In Person  Provider:   Eleonore Chiquito, MD

## 2022-03-13 DIAGNOSIS — R0600 Dyspnea, unspecified: Secondary | ICD-10-CM | POA: Diagnosis not present

## 2022-03-13 DIAGNOSIS — I5020 Unspecified systolic (congestive) heart failure: Secondary | ICD-10-CM | POA: Diagnosis not present

## 2022-03-13 DIAGNOSIS — R0902 Hypoxemia: Secondary | ICD-10-CM | POA: Diagnosis not present

## 2022-03-13 DIAGNOSIS — R0602 Shortness of breath: Secondary | ICD-10-CM | POA: Diagnosis not present

## 2022-03-16 ENCOUNTER — Ambulatory Visit (HOSPITAL_COMMUNITY)
Admission: RE | Admit: 2022-03-16 | Discharge: 2022-03-16 | Disposition: A | Discharge status: Home / Self Care | Payer: Medicare Other | Source: Ambulatory Visit | Attending: Cardiovascular Disease | Admitting: Cardiovascular Disease

## 2022-03-16 DIAGNOSIS — I428 Other cardiomyopathies: Secondary | ICD-10-CM | POA: Diagnosis not present

## 2022-03-16 LAB — ECHOCARDIOGRAM COMPLETE
Area-P 1/2: 4.6 cm2
MV M vel: 3.7 m/s
MV Peak grad: 54.8 mmHg
Radius: 0.4 cm
S' Lateral: 5.9 cm

## 2022-03-16 NOTE — Progress Notes (Signed)
*  PRELIMINARY RESULTS* Echocardiogram 2D Echocardiogram has been performed.  Carol Hardin 03/16/2022, 12:54 PM

## 2022-03-17 ENCOUNTER — Telehealth: Payer: Self-pay | Admitting: Cardiovascular Disease

## 2022-03-17 NOTE — Telephone Encounter (Signed)
Pt is calling in regards to results she received in the mail about her echo done yesterday. Requesting call back.

## 2022-03-17 NOTE — Telephone Encounter (Signed)
Patient wanted to know about her echo results. Explained that we will contact her when Dr. Audie Box makes his notations on the results.

## 2022-03-18 DIAGNOSIS — R946 Abnormal results of thyroid function studies: Secondary | ICD-10-CM | POA: Diagnosis not present

## 2022-03-18 DIAGNOSIS — E441 Mild protein-calorie malnutrition: Secondary | ICD-10-CM | POA: Diagnosis not present

## 2022-03-18 DIAGNOSIS — E559 Vitamin D deficiency, unspecified: Secondary | ICD-10-CM | POA: Diagnosis not present

## 2022-03-18 DIAGNOSIS — R634 Abnormal weight loss: Secondary | ICD-10-CM | POA: Diagnosis not present

## 2022-03-18 DIAGNOSIS — R7303 Prediabetes: Secondary | ICD-10-CM | POA: Diagnosis not present

## 2022-03-18 NOTE — Telephone Encounter (Signed)
Patient seen results via mychart- Thanks!

## 2022-03-23 DIAGNOSIS — E441 Mild protein-calorie malnutrition: Secondary | ICD-10-CM | POA: Diagnosis not present

## 2022-03-23 DIAGNOSIS — R600 Localized edema: Secondary | ICD-10-CM | POA: Diagnosis not present

## 2022-03-23 DIAGNOSIS — K5909 Other constipation: Secondary | ICD-10-CM | POA: Diagnosis not present

## 2022-03-23 DIAGNOSIS — R946 Abnormal results of thyroid function studies: Secondary | ICD-10-CM | POA: Diagnosis not present

## 2022-03-23 DIAGNOSIS — I5022 Chronic systolic (congestive) heart failure: Secondary | ICD-10-CM | POA: Diagnosis not present

## 2022-03-23 DIAGNOSIS — R0600 Dyspnea, unspecified: Secondary | ICD-10-CM | POA: Diagnosis not present

## 2022-03-23 DIAGNOSIS — I4821 Permanent atrial fibrillation: Secondary | ICD-10-CM | POA: Diagnosis not present

## 2022-03-23 DIAGNOSIS — M810 Age-related osteoporosis without current pathological fracture: Secondary | ICD-10-CM | POA: Diagnosis not present

## 2022-03-23 DIAGNOSIS — R634 Abnormal weight loss: Secondary | ICD-10-CM | POA: Diagnosis not present

## 2022-03-23 DIAGNOSIS — G4700 Insomnia, unspecified: Secondary | ICD-10-CM | POA: Diagnosis not present

## 2022-03-23 DIAGNOSIS — R7303 Prediabetes: Secondary | ICD-10-CM | POA: Diagnosis not present

## 2022-03-23 DIAGNOSIS — J310 Chronic rhinitis: Secondary | ICD-10-CM | POA: Diagnosis not present

## 2022-03-25 NOTE — H&P (Signed)
Surgical History & Physical  Patient Name: Carol Hardin DOB: [birth year]  Surgery: Cataract extraction with intraocular lens implant phacoemulsification; Right Eye  Surgeon: Baruch Goldmann MD Surgery Date:  04-05-22 Pre-Op Date:  03-25-22  HPI: A 64 Yr. old female patient The patient is here for a cataract evaluation of the right eye, referred from Dr. Hassell Done. Since the last visit, the affected area is worsening. The patient's vision is blurry. The complaint is associated with difficulty reading small print, experiencing glare on bright sunny days, and seeing captions on a tv. This is negatively affecting the patient's quality of life and the patient is unable to function adequately in life with the current level of vision. Patient had an occurrence yesterday in her OS, and she lost vision in her OS for a couple minutes. HPI Completed by Dr. Baruch Goldmann  Medical History: Cataracts  Arthritis Diverticulitis Heart Problem High Blood Pressure  Review of Systems Negative Allergic/Immunologic Negative Cardiovascular Negative Constitutional Negative Ear, Nose, Mouth & Throat Negative Endocrine Negative Eyes Negative Gastrointestinal Negative Genitourinary Negative Hemotologic/Lymphatic Negative Integumentary Negative Musculoskeletal Negative Neurological Negative Psychiatry Negative Respiratory  Social   Never smoked   Medication Refresh artificial tears,  Clobetasol, Triamcinolone Cream, Carvedilol, Eliquis, Furosemide, Gabapentin, Lisinopril, Loratadine, Myrbetriq, Oxybutynin Chloride,   Sx/Procedures Cataract sx ZCBOO +24.0, YAG Capsulotomy,  Hip Sx, Knee Surgery, Pacemaker,   Drug Allergies  Sulfa (sulfonamide antibiotics), Omeprazole, Flagyl, Amiodarone, Arlpiprazole, Hylan g-f 20, Paroxetine, Statins, Vancomycin, Clindamycin,    History & Physical: Heent: Cataract, right eye NECK: supple without bruits LUNGS: lungs clear to auscultation CV: regular rate and  rhythm Abdomen: soft and non-tender  Impression & Plan: Assessment: 1.  COMBINED FORMS AGE RELATED CATARACT; Right Eye (H25.811) 2.  BLEPHARITIS; Right Upper Lid, Right Lower Lid, Left Upper Lid, Left Lower Lid (H01.001, H01.002,H01.004,H01.005) 3.  Pinguecula; Both Eyes (H11.153) 4.  INTRAOCULAR LENS IOL (Z96.1)  Plan: 1.  Cataract accounts for the patient's decreased vision. This visual impairment is not correctable with a tolerable change in glasses or contact lenses. Cataract surgery with an implantation of a new lens should significantly improve the visual and functional status of the patient. Discussed all risks, benefits, alternatives, and potential complications. Discussed the procedures and recovery. Patient desires to have surgery. A-scan ordered and performed today for intra-ocular lens calculations. The surgery will be performed in order to improve vision for driving, reading, and for eye examinations. Recommend phacoemulsification with intra-ocular lens. Recommend Dextenza for post-operative pain and inflammation. Right Eye. Surgery required to correct imbalance of vision. Dilates well - shugarcaine by protocol. Will want to obtain the IOL in the [facility] from [facility] (Dr. Valetta Close) to pick balanced IOL.  2.  Recommend regular lid cleaning  3.  Observe; Artificial tears as needed for irritation.  4.  S/P YAG

## 2022-03-29 DIAGNOSIS — H25811 Combined forms of age-related cataract, right eye: Secondary | ICD-10-CM | POA: Diagnosis not present

## 2022-03-30 ENCOUNTER — Telehealth: Payer: Self-pay

## 2022-03-30 ENCOUNTER — Encounter (HOSPITAL_COMMUNITY)
Admission: RE | Admit: 2022-03-30 | Discharge: 2022-03-30 | Disposition: A | Discharge status: Home / Self Care | Payer: Medicare Other | Source: Ambulatory Visit | Attending: Ophthalmology | Admitting: Ophthalmology

## 2022-03-30 NOTE — Telephone Encounter (Signed)
Received lab work results from PCP- [place].  Scanned into chart today 03/30/2022.

## 2022-03-31 ENCOUNTER — Telehealth: Payer: Self-pay | Admitting: Cardiology

## 2022-03-31 MED ORDER — CARVEDILOL 3.125 MG PO TABS: 3.1250 mg | ORAL_TABLET | Freq: Two times a day (BID) | ORAL | 3 refills | Status: DC

## 2022-03-31 NOTE — Telephone Encounter (Signed)
*  STAT* If patient is at the pharmacy, call can be transferred to refill team.   1. Which medications need to be refilled? (please list name of each medication and dose if known)  carvedilol (COREG) 3.125 MG tablet (Expired)  2. Which pharmacy/location (including street and city if local pharmacy) is medication to be sent to? [facility] [place] [place] [address]  3. Do they need a 30 day or 90 day supply? [place]

## 2022-04-01 ENCOUNTER — Ambulatory Visit: Payer: Medicare Other | Admitting: Internal Medicine

## 2022-04-01 ENCOUNTER — Encounter: Payer: Self-pay | Admitting: Internal Medicine

## 2022-04-01 VITALS — BP 100/70 | HR 64 | Ht 64.0 in | Wt 118.4 lb

## 2022-04-01 DIAGNOSIS — I493 Ventricular premature depolarization: Secondary | ICD-10-CM | POA: Diagnosis not present

## 2022-04-01 DIAGNOSIS — I482 Chronic atrial fibrillation, unspecified: Secondary | ICD-10-CM

## 2022-04-01 DIAGNOSIS — I495 Sick sinus syndrome: Secondary | ICD-10-CM | POA: Diagnosis not present

## 2022-04-01 DIAGNOSIS — Z950 Presence of cardiac pacemaker: Secondary | ICD-10-CM

## 2022-04-01 DIAGNOSIS — I5022 Chronic systolic (congestive) heart failure: Secondary | ICD-10-CM | POA: Diagnosis not present

## 2022-04-01 NOTE — Patient Instructions (Addendum)
Medication Instructions:  Your physician recommends that you continue on your current medications as directed. Please refer to the Current Medication list given to you today.  Labwork: None ordered.  Testing/Procedures: None ordered.  Follow-Up: Your physician wants you to follow-up in: one year with "[employer], PA-C. You will receive a reminder letter in the mail two months in advance. If you don't receive a letter, please call our office to schedule the follow-up appointment.   Remote monitoring is used to monitor your Pacemaker from home. This monitoring reduces the number of office visits required to check your device to one time per year. It allows [place] to keep an eye on the functioning of your device to ensure it is working properly. You are scheduled for a device check from home on 04/27/2022. You may send your transmission at any time that day. If you have a wireless device, the transmission will be sent automatically. After your physician reviews your transmission, you will receive a postcard with your next transmission date.  Any Other Special Instructions Will Be Listed Below (If Applicable).  If you need a refill on your cardiac medications before your next appointment, please call your pharmacy.   Important Information About Sugar

## 2022-04-01 NOTE — Progress Notes (Signed)
HPI [name] is referred today by Dr. Greggory Brandy for ongoing care. She is a pleasant 88 yo woman with a h/o chronic systolic heart failure, now permanent atrial fib, and CHB s/p PPM insertion. She has class 2 symptoms. She has not had syncope and denies chest pain. She also has mitral valve prolapse. Allergies  Allergen Reactions   Omeprazole Other (See Comments)    siezure   Flagyl [Metronidazole] Nausea And Vomiting   Amiodarone    Aripiprazole Other (See Comments)    Unknown reaction   Clindamycin/Lincomycin Other (See Comments)    Bacterial infection / burning in throat   Gabapentin Itching   Hylan G-F 20 Other (See Comments)    Unknown reaction   Metoprolol Tartrate    Paroxetine    Paroxetine Hcl Other (See Comments)    Headaches (a long time ago- pt doesn't really remember)   Statins Other (See Comments)    No specific reaction given-patient states that she was advised not to take by physician   Sulfa Antibiotics Diarrhea and Other (See Comments)    Colitis    Toprol Xl [Metoprolol Succinate] Other (See Comments)    Dizziness--pt doesn't really remember    Vancomycin Itching and Other (See Comments)    Pt reports that they gave it too fast- she started having itching in the scalp   Penicillins Rash    DID THE REACTION INVOLVE: Swelling of the face/tongue/throat, SOB, or low BP? No Sudden or severe rash/hives, skin peeling, or the inside of the mouth or nose? Yes Did it require medical treatment? Unknown When did it last happen?  Over 10 years     If all above answers are "NO", may proceed with cephalosporin use.      Current Outpatient Medications  Medication Sig Dispense Refill   acetaminophen (TYLENOL) 325 MG tablet Take 325-975 mg by mouth daily.     apixaban (ELIQUIS) 2.5 MG TABS tablet TAKE 1 TABLET(2.5 MG) BY MOUTH TWICE DAILY 60 tablet 0   bismuth subsalicylate (PEPTO BISMOL) 262 MG/15ML suspension Take 30 mLs by mouth as needed.     carvedilol (COREG)  3.125 MG tablet Take 1 tablet (3.125 mg total) by mouth 2 (two) times daily with a meal. 180 tablet 3   cetirizine (ZYRTEC) 5 MG tablet Take 5 mg by mouth daily.     divalproex (DEPAKOTE ER) 250 MG 24 hr tablet Take 1 tablet (250 mg total) by mouth at bedtime. 30 tablet 1   Ensure (ENSURE) Take 237 mLs by mouth daily.      fexofenadine (ALLEGRA) 60 MG tablet Take 1 tablet (60 mg total) by mouth daily. 30 tablet 5   furosemide (LASIX) 20 MG tablet Take 20 mg by mouth as needed for fluid.     lisinopril (ZESTRIL) 2.5 MG tablet TAKE 1 TABLET(2.5 MG) BY MOUTH DAILY 90 tablet 2   LORazepam (ATIVAN) 1 MG tablet Take 1 mg by mouth at bedtime.     oxybutynin (DITROPAN-XL) 10 MG 24 hr tablet Take 10 mg by mouth daily.     Polyethyl Glycol-Propyl Glycol (SYSTANE OP) Apply to eye as needed.     triamcinolone ointment (KENALOG) 0.1 % Apply 1 application topically 2 (two) times daily. 454 g 1   carvedilol (COREG) 3.125 MG tablet Take 1 tablet (3.125 mg total) by mouth 2 (two) times daily. 180 tablet 3   No current facility-administered medications for this visit.     Past Medical History:  Diagnosis Date   Anxiety    Arthritis    "fingers, right toe" (08/09/2016)   Atrial fibrillation (HCC)    Basal cell carcinoma of lower leg, right    Bradycardia    a. Holter 08/30/16 showed profound bradycardia down to 30 during awake hours, several 2 second pauses, very frequent PVCs >8000 in 48 hours, and NSVT (longest of 14 beats).   Cardiomyopathy ([place])    a. EF 40% by echo 08/2016.   Daily headache    "I usually wake up w/a headache; sometimes it's a migraine" (08/09/2016)   Depression    Diverticulosis    Hx. of   Frequent PVCs    GERD (gastroesophageal reflux disease)    Hypercholesterolemia    Migraine    Mitral regurgitation    MVP (mitral valve prolapse)    NSVT (nonsustained ventricular tachycardia) ([place])    a. first noted event monitor 08/2016.   Osteoporosis    PAF (paroxysmal atrial  fibrillation) ([place])    a. in afib at time of echo 08/2016.   Pneumonia    Scoliosis    mild   Squamous cell carcinoma of neck    Stroke ([place]) 05/2018    ROS:   All systems reviewed and negative except as noted in the HPI.   Past Surgical History:  Procedure Laterality Date   AV NODE ABLATION N/A 01/23/2019   Procedure: AV NODE ABLATION;  Surgeon: Thompson Grayer, MD;  Location: [facility];  Service: Cardiovascular;  Laterality: N/A;   BASAL CELL CARCINOMA EXCISION Right 1980s   RLE   BIV PACEMAKER INSERTION CRT-P N/A 01/23/2019   Procedure: BIV PACEMAKER INSERTION CRT-P;  Surgeon: Thompson Grayer, MD;  Location: [facility];  Service: Cardiovascular;  Laterality: N/A;   BLEPHAROPLASTY     BREAST BIOPSY Left 1970s   BREAST CYST ASPIRATION Left 1970s   BREAST CYST EXCISION Left 1970s   BUNIONECTOMY Bilateral    DILATION AND CURETTAGE OF UTERUS     EXCISIONAL HEMORRHOIDECTOMY  1970s   INTRAMEDULLARY (IM) NAIL INTERTROCHANTERIC Left 12/31/2017   Procedure: INTRAMEDULLARY (IM) NAIL INTERTROCHANTRIC;  Surgeon: Nicholes Stairs, MD;  Location: [facility];  Service: Orthopedics;  Laterality: Left;   KNEE ARTHROSCOPY Right 04/12/2007   KNEE ARTHROSCOPY Left 1998   SQUAMOUS CELL CARCINOMA EXCISION     "neck"   TONSILLECTOMY       Family History  Problem Relation Age of Onset   Heart failure Mother    Leukemia Mother    Heart failure Father    COPD Father    Alzheimer's disease Father    Alzheimer's disease Brother    Heart Problems Brother        stent   Heart Problems Son        stent    Allergic rhinitis Neg Hx    Angioedema Neg Hx    Asthma Neg Hx    Eczema Neg Hx    Atopy Neg Hx    Immunodeficiency Neg Hx    Urticaria Neg Hx    Headache Neg Hx      Social History   Socioeconomic History   Marital status: Married    Spouse name: Not on file   Number of children: 4   Years of education: 2.5 yrs of college   Highest education level: Not on file   Occupational History   Not on file  Tobacco Use   Smoking status: Former    Packs/day: 1.00  Years: 20.00    Total pack years: 20.00    Types: Cigarettes    Quit date: 67    Years since quitting: 34.4   Smokeless tobacco: Never  Vaping Use   Vaping Use: Never used  Substance and Sexual Activity   Alcohol use: No    Comment: quit 1995, was occasional    Drug use: Never   Sexual activity: Not Currently  Other Topics Concern   Not on file  Social History Narrative   Lives at home with spouse   Caffeine: 2 cups coffee/day   Right handed   Social Determinants of Health   Financial Resource Strain: Medium Risk (08/17/2017)   Overall Financial Resource Strain (CARDIA)    Difficulty of Paying Living Expenses: Somewhat hard  Food Insecurity: No Food Insecurity (08/17/2017)   Hunger Vital Sign    Worried About Running Out of Food in the Last Year: Never true    Ran Out of Food in the Last Year: Never true  Transportation Needs: No Transportation Needs (08/17/2017)   PRAPARE - Hydrologist (Medical): No    Lack of Transportation (Non-Medical): No  Physical Activity: Inactive (08/17/2017)   Exercise Vital Sign    Days of Exercise per Week: 0 days    Minutes of Exercise per Session: 0 min  Stress: Stress Concern Present (08/17/2017)   [place]    Feeling of Stress : Rather much  Social Connections: Moderately Isolated (08/17/2017)   Social Connection and Isolation Panel [NHANES]    Frequency of Communication with Friends and Family: Once a week    Frequency of Social Gatherings with Friends and Family: Once a week    Attends Religious Services: Never    Marine scientist or Organizations: No    Attends Archivist Meetings: Never    Marital Status: Married  Human resources officer Violence: Not At Risk (08/17/2017)   Humiliation, Afraid, Rape, and Kick questionnaire    Fear  of Current or Ex-Partner: No    Emotionally Abused: No    Physically Abused: No    Sexually Abused: No     BP 100/70   Pulse 64   Ht '5\' 4"'$  (1.626 m)   Wt 118 lb 6.4 oz (53.7 kg)   SpO2 96%   BMI 20.32 kg/m   Physical Exam:  Well appearing NAD HEENT: Unremarkable Neck:  No JVD, no thyromegally Lymphatics:  No adenopathy Back:  No CVA tenderness Lungs:  Clear HEART:  Regular rate rhythm, no murmurs, no rubs, no clicks Abd:  soft, positive bowel sounds, no organomegally, no rebound, no guarding Ext:  2 plus pulses, no edema, no cyanosis, no clubbing Skin:  No rashes no nodules Neuro:  CN II through XII intact, motor grossly intact  EKG - atrial fib with ventricular pacing  DEVICE  Normal device function.  See PaceArt for details.   Assess/Plan:  Perm atrial fib - her VR is well controlled.  Coags - she has not had any bleeding on low dose eliquis. Chronic systolic heart failure - her symptoms appear to be class 2. No change in medical therapy. PPM - her [employer] Jude biv ppm is working normally with over 3 years of battery longevity. We will recheck in several months.  Carleene Overlie Rubi Tooley,MD

## 2022-04-05 ENCOUNTER — Ambulatory Visit (HOSPITAL_COMMUNITY)
Admission: RE | Admit: 2022-04-05 | Discharge: 2022-04-05 | Disposition: A | Discharge status: Home / Self Care | Payer: Medicare Other | Attending: Ophthalmology | Admitting: Ophthalmology

## 2022-04-05 ENCOUNTER — Encounter (HOSPITAL_COMMUNITY)
Admission: RE | Disposition: A | Discharge status: Home / Self Care | Payer: Self-pay | Source: Home / Self Care | Attending: Ophthalmology

## 2022-04-05 ENCOUNTER — Encounter (HOSPITAL_COMMUNITY): Payer: Self-pay | Admitting: Ophthalmology

## 2022-04-05 ENCOUNTER — Ambulatory Visit (HOSPITAL_COMMUNITY): Payer: Medicare Other | Admitting: Anesthesiology

## 2022-04-05 ENCOUNTER — Ambulatory Visit (HOSPITAL_BASED_OUTPATIENT_CLINIC_OR_DEPARTMENT_OTHER): Payer: Medicare Other | Admitting: Anesthesiology

## 2022-04-05 ENCOUNTER — Other Ambulatory Visit: Payer: Self-pay

## 2022-04-05 DIAGNOSIS — H25811 Combined forms of age-related cataract, right eye: Secondary | ICD-10-CM

## 2022-04-05 DIAGNOSIS — F418 Other specified anxiety disorders: Secondary | ICD-10-CM | POA: Diagnosis not present

## 2022-04-05 DIAGNOSIS — H0100A Unspecified blepharitis right eye, upper and lower eyelids: Secondary | ICD-10-CM | POA: Insufficient documentation

## 2022-04-05 DIAGNOSIS — Z87891 Personal history of nicotine dependence: Secondary | ICD-10-CM | POA: Diagnosis not present

## 2022-04-05 DIAGNOSIS — H0100B Unspecified blepharitis left eye, upper and lower eyelids: Secondary | ICD-10-CM | POA: Diagnosis not present

## 2022-04-05 DIAGNOSIS — Z961 Presence of intraocular lens: Secondary | ICD-10-CM | POA: Insufficient documentation

## 2022-04-05 DIAGNOSIS — Z8673 Personal history of transient ischemic attack (TIA), and cerebral infarction without residual deficits: Secondary | ICD-10-CM

## 2022-04-05 DIAGNOSIS — E46 Unspecified protein-calorie malnutrition: Secondary | ICD-10-CM

## 2022-04-05 DIAGNOSIS — H11153 Pinguecula, bilateral: Secondary | ICD-10-CM | POA: Insufficient documentation

## 2022-04-05 HISTORY — PX: CATARACT EXTRACTION W/PHACO: SHX586

## 2022-04-05 SURGERY — PHACOEMULSIFICATION, CATARACT, WITH IOL INSERTION
Anesthesia: Monitor Anesthesia Care | Site: Eye | Laterality: Right

## 2022-04-05 MED ORDER — TETRACAINE HCL 0.5 % OP SOLN
1.0000 [drp] | OPHTHALMIC | Status: AC | PRN
  Administered 2022-04-05 (×3): 1 [drp] via OPHTHALMIC

## 2022-04-05 MED ORDER — POVIDONE-IODINE 5 % OP SOLN
OPHTHALMIC | Status: DC | PRN
  Administered 2022-04-05: 1 via OPHTHALMIC

## 2022-04-05 MED ORDER — SODIUM HYALURONATE 10 MG/ML IO SOLUTION
PREFILLED_SYRINGE | INTRAOCULAR | Status: DC | PRN
  Administered 2022-04-05: 0.85 mL via INTRAOCULAR

## 2022-04-05 MED ORDER — APIXABAN 2.5 MG PO TABS: ORAL_TABLET | ORAL | 5 refills | Status: AC

## 2022-04-05 MED ORDER — TROPICAMIDE 1 % OP SOLN
1.0000 [drp] | OPHTHALMIC | Status: AC | PRN
  Administered 2022-04-05 (×3): 1 [drp] via OPHTHALMIC

## 2022-04-05 MED ORDER — MIDAZOLAM HCL 2 MG/2ML IJ SOLN
INTRAMUSCULAR | Status: AC
  Filled 2022-04-05: qty 2

## 2022-04-05 MED ORDER — STERILE WATER FOR IRRIGATION IR SOLN
Status: DC | PRN
  Administered 2022-04-05: 250 mL

## 2022-04-05 MED ORDER — FENTANYL CITRATE (PF) 100 MCG/2ML IJ SOLN
INTRAMUSCULAR | Status: AC
  Filled 2022-04-05: qty 2

## 2022-04-05 MED ORDER — SODIUM CHLORIDE 0.9% FLUSH
INTRAVENOUS | Status: DC | PRN
  Administered 2022-04-05: 3 mL via INTRAVENOUS

## 2022-04-05 MED ORDER — EPINEPHRINE PF 1 MG/ML IJ SOLN
INTRAOCULAR | Status: DC | PRN
  Administered 2022-04-05: 500 mL

## 2022-04-05 MED ORDER — SODIUM HYALURONATE 23MG/ML IO SOSY
PREFILLED_SYRINGE | INTRAOCULAR | Status: DC | PRN
  Administered 2022-04-05: 0.6 mL via INTRAOCULAR

## 2022-04-05 MED ORDER — LIDOCAINE HCL (PF) 1 % IJ SOLN
INTRAOCULAR | Status: DC | PRN
  Administered 2022-04-05: 1 mL via OPHTHALMIC

## 2022-04-05 MED ORDER — BSS IO SOLN
INTRAOCULAR | Status: DC | PRN
  Administered 2022-04-05: 15 mL via INTRAOCULAR

## 2022-04-05 MED ORDER — FENTANYL CITRATE (PF) 100 MCG/2ML IJ SOLN
INTRAMUSCULAR | Status: DC | PRN
  Administered 2022-04-05: 50 ug via INTRAVENOUS

## 2022-04-05 MED ORDER — MIDAZOLAM HCL 5 MG/5ML IJ SOLN
INTRAMUSCULAR | Status: DC | PRN
  Administered 2022-04-05: 1 mg via INTRAVENOUS

## 2022-04-05 MED ORDER — PHENYLEPHRINE HCL 2.5 % OP SOLN
1.0000 [drp] | OPHTHALMIC | Status: AC | PRN
  Administered 2022-04-05 (×3): 1 [drp] via OPHTHALMIC

## 2022-04-05 MED ORDER — LIDOCAINE HCL 3.5 % OP GEL
1.0000 | Freq: Once | OPHTHALMIC | Status: AC
  Administered 2022-04-05: 1 via OPHTHALMIC

## 2022-04-05 SURGICAL SUPPLY — 16 items
CATARACT SUITE SIGHTPATH (MISCELLANEOUS) ×2 IMPLANT
CLOTH BEACON ORANGE TIMEOUT ST (SAFETY) ×2 IMPLANT
EYE SHIELD UNIVERSAL CLEAR (GAUZE/BANDAGES/DRESSINGS) ×1 IMPLANT
FEE CATARACT SUITE SIGHTPATH (MISCELLANEOUS) ×1 IMPLANT
GLOVE BIOGEL PI IND STRL 7.0 (GLOVE) ×2 IMPLANT
GLOVE BIOGEL PI INDICATOR 7.0 (GLOVE) ×2
LENS IOL DIOP 23.0 (Intraocular Lens) ×2 IMPLANT
LENS IOL TECNIS MONO 23.0 (Intraocular Lens) IMPLANT
NDL HYPO 18GX1.5 BLUNT FILL (NEEDLE) IMPLANT
NEEDLE HYPO 18GX1.5 BLUNT FILL (NEEDLE) ×2 IMPLANT
PAD ARMBOARD 7.5X6 YLW CONV (MISCELLANEOUS) ×2 IMPLANT
RING MALYGIN 7.0 (MISCELLANEOUS) IMPLANT
SYR TB 1ML LL NO SAFETY (SYRINGE) ×2 IMPLANT
TAPE SURG TRANSPORE 1 IN (GAUZE/BANDAGES/DRESSINGS) IMPLANT
TAPE SURGICAL TRANSPORE 1 IN (GAUZE/BANDAGES/DRESSINGS) ×2
WATER STERILE IRR 250ML POUR (IV SOLUTION) ×2 IMPLANT

## 2022-04-05 NOTE — Op Note (Addendum)
Date of procedure: 04/05/22  Pre-operative diagnosis:  Visually significant combined form age-related cataract, Right Eye (H25.811)  Post-operative diagnosis:  Visually significant combined form age-related cataract, Right Eye (H25.811)  Procedure: Removal of cataract via phacoemulsification and insertion of intra-ocular lens [employer] [id number] +23.0D into the capsular bag of the Right Eye  Attending surgeon: Rudy Jew. Destinae Neubecker, MD, MA  Anesthesia: MAC, Topical Akten  Complications: None  Estimated Blood Loss: <51mL (minimal)  Specimens: None  Implants: As above  Indications:  Visually significant age-related cataract, Right Eye  Procedure:  The patient was seen and identified in the pre-operative area. The operative eye was identified and dilated.  The operative eye was marked.  Topical anesthesia was administered to the operative eye.     The patient was then to the operative suite and placed in the supine position.  A timeout was performed confirming the patient, procedure to be performed, and all other relevant information.   The patient's face was prepped and draped in the usual fashion for intra-ocular surgery.  A lid speculum was placed into the operative eye and the surgical microscope moved into place and focused.  A superotemporal paracentesis was created using a 20 gauge paracentesis blade.  Shugarcaine was injected into the anterior chamber.  Viscoelastic was injected into the anterior chamber.  A temporal clear-corneal main wound incision was created using a 2.69mm microkeratome.  A continuous curvilinear capsulorrhexis was initiated using an irrigating cystitome and completed using capsulorrhexis forceps.  Hydrodissection and hydrodeliniation were performed.  Viscoelastic was injected into the anterior chamber.  A phacoemulsification handpiece and a chopper as a second instrument were used to remove the nucleus and epinucleus. The irrigation/aspiration handpiece was used to  remove any remaining cortical material.   The capsular bag was reinflated with viscoelastic, checked, and found to be intact.  The intraocular lens was inserted into the capsular bag.  The irrigation/aspiration handpiece was used to remove any remaining viscoelastic.  The clear corneal wound and paracentesis wounds were then hydrated and checked with Weck-Cels to be watertight.  The lid-speculum was removed.  The drape was removed.  The patient's face was cleaned with a wet and dry 4x4. A clear shield was taped over the eye. The patient was taken to the post-operative care unit in good condition, having tolerated the procedure well.  Post-Op Instructions: The patient will follow up at [facility] for a same day post-operative evaluation and will receive all other orders and instructions.

## 2022-04-05 NOTE — Anesthesia Preprocedure Evaluation (Signed)
Anesthesia Evaluation  Patient identified by MRN, date of birth, ID band Patient awake    Reviewed: Allergy & Precautions, H&P , NPO status , Patient's Chart, lab work & pertinent test results, reviewed documented beta blocker date and time   Airway Mallampati: II  TM Distance: >3 FB Neck ROM: full    Dental no notable dental hx.    Pulmonary neg pulmonary ROS, former smoker,    Pulmonary exam normal breath sounds clear to auscultation       Cardiovascular Exercise Tolerance: Good negative cardio ROS   Rhythm:regular Rate:Normal     Neuro/Psych  Headaches, PSYCHIATRIC DISORDERS Anxiety Depression  Neuromuscular disease CVA    GI/Hepatic Neg liver ROS, GERD  Medicated,  Endo/Other  negative endocrine ROS  Renal/GU negative Renal ROS  negative genitourinary   Musculoskeletal   Abdominal   Peds  Hematology negative hematology ROS (+)   Anesthesia Other Findings   Reproductive/Obstetrics negative OB ROS                             Anesthesia Physical Anesthesia Plan  ASA: 3  Anesthesia Plan: MAC   Post-op Pain Management:    Induction:   PONV Risk Score and Plan:   Airway Management Planned:   Additional Equipment:   Intra-op Plan:   Post-operative Plan:   Informed Consent: I have reviewed the patients History and Physical, chart, labs and discussed the procedure including the risks, benefits and alternatives for the proposed anesthesia with the patient or authorized representative who has indicated his/her understanding and acceptance.     Dental Advisory Given  Plan Discussed with: CRNA  Anesthesia Plan Comments:         Anesthesia Quick Evaluation

## 2022-04-08 ENCOUNTER — Ambulatory Visit: Payer: Medicare Other | Admitting: Neurology

## 2022-04-08 ENCOUNTER — Encounter (HOSPITAL_COMMUNITY): Payer: Self-pay | Admitting: Ophthalmology

## 2022-04-08 NOTE — Addendum Note (Signed)
Addendum  created 04/08/22 1053 by Orlie Dakin, CRNA   Intraprocedure Staff edited

## 2022-04-12 DIAGNOSIS — R0600 Dyspnea, unspecified: Secondary | ICD-10-CM | POA: Diagnosis not present

## 2022-04-12 DIAGNOSIS — I5020 Unspecified systolic (congestive) heart failure: Secondary | ICD-10-CM | POA: Diagnosis not present

## 2022-04-12 DIAGNOSIS — R0902 Hypoxemia: Secondary | ICD-10-CM | POA: Diagnosis not present

## 2022-04-12 DIAGNOSIS — R0602 Shortness of breath: Secondary | ICD-10-CM | POA: Diagnosis not present

## 2022-04-15 ENCOUNTER — Telehealth: Payer: Self-pay | Admitting: Cardiovascular Disease

## 2022-04-15 NOTE — Telephone Encounter (Signed)
Pt c/o medication issue:  1. Name of Medication: apixaban (ELIQUIS) 2.5 MG TABS tablet  2. How are you currently taking this medication (dosage and times per day)? As written  3. Are you having a reaction (difficulty breathing--STAT)? nop  4. What is your medication issue? Pt states she would like to take only 1 pill a day. She states that her spider veins are blue and it makes her feet look blue.

## 2022-04-15 NOTE — Telephone Encounter (Signed)
Returned call to patient who states that she is concerned about the blue color of her feet and swelling of her legs and feet. Patient states she would like to decrease Eliquis, or switch to something else. Advised patient that dosage of Eliquis was appropriate and patient would like to know about another option for blood thinner. Patient also states she would not be interested coumadin. Patient states that she is concerned that she just had cataract surgery and has been taking a combination eye drop that consists of an antibiotic, and steroid (Prednisolone/Moxifloxacin/bromfenac). Patient states that she feels that this has been causing her legs to swell. Patient reports that she has had to take her lasix every day for the last week or so. Patient states she has gained 4 pounds but over a span of a few weeks. Patients current weight is 118lb. Patient states she has been also watching her sodium and denies any other symptoms. Patient also states the blue color to her legs and feet has been going on for a long time and is not new. Advised patient I would forward message to our pharmD and Dr. Audie Box for them to review and advise. Patient verbalized understanding.

## 2022-04-15 NOTE — Telephone Encounter (Signed)
Geralynn Rile, MD  You; Cv Div Pharmd; Caprice Beaver, LPN 26 minutes ago (0:27 PM)    Have her take a dose of Lasix.  She only takes this as needed.  She has no other options for blood thinners besides Eliquis and Coumadin.  Would recommend she stay on Eliquis for now.  I do not believe this is causing her symptoms.  Could be the antibiotics.  For now would just take 1 dose of Lasix.   [name]. Audie Box, MD, [place]  [address], [place]  [place], [place] [address]  [phone]  4:06 PM    Returned call to patient and advised her of Dr. Kathalene Frames recommendations, patient states she took 1 dose of lasix today around 2pm. Advised patient to wear compression stockings and monitor daily weights. Advised patient to call back to office with any issues, questions, or concerns. Patient verbalized understanding.

## 2022-04-16 DIAGNOSIS — R310 Gross hematuria: Secondary | ICD-10-CM | POA: Diagnosis not present

## 2022-04-27 ENCOUNTER — Ambulatory Visit (INDEPENDENT_AMBULATORY_CARE_PROVIDER_SITE_OTHER): Payer: Medicare Other

## 2022-04-27 DIAGNOSIS — I495 Sick sinus syndrome: Secondary | ICD-10-CM | POA: Diagnosis not present

## 2022-04-27 LAB — CUP PACEART REMOTE DEVICE CHECK
Battery Remaining Longevity: 43 mo
Battery Remaining Percentage: 52 %
Battery Voltage: 2.99 V
Date Time Interrogation Session: 20230718020008
Implantable Lead Implant Date: 20200414
Implantable Lead Implant Date: 20200414
Implantable Lead Implant Date: 20200414
Implantable Lead Location: 753858
Implantable Lead Location: 753859
Implantable Lead Location: 753860
Implantable Pulse Generator Implant Date: 20200414
Lead Channel Impedance Value: 430 Ohm
Lead Channel Impedance Value: 530 Ohm
Lead Channel Pacing Threshold Amplitude: 0.5 V
Lead Channel Pacing Threshold Amplitude: 1 V
Lead Channel Pacing Threshold Pulse Width: 0.5 ms
Lead Channel Pacing Threshold Pulse Width: 0.7 ms
Lead Channel Sensing Intrinsic Amplitude: 6.5 mV
Lead Channel Setting Pacing Amplitude: 2.5 V
Lead Channel Setting Pacing Amplitude: 2.5 V
Lead Channel Setting Pacing Pulse Width: 0.5 ms
Lead Channel Setting Pacing Pulse Width: 0.7 ms
Lead Channel Setting Sensing Sensitivity: 3 mV
Pulse Gen Model: 3562
Pulse Gen Serial Number: 9431558

## 2022-04-28 NOTE — Progress Notes (Signed)
NEUROLOGY FOLLOW UP OFFICE NOTE  Carol Hardin [id number]  Assessment/Plan:   Chronic daily headache   She is open to trying Depakote ER '250mg'$  at bedtime.  Check CBC and LFTs in 3 months Follow up 4 months.   Subjective:  Carol Hardin is an 88 year old female with PAF, cardiomyopathy, bradycardia, CKD, depression, anxiety and history of stroke who follows up for headaches.  UPDATE: Stopped topiramate and switched to gabapentin.  After starting gabapentin, reported side effects - migraines now accompanied by visual aura (zigzag lines).  Changed to duloxetine but also stated it was causing side effects.  I recommended switching to sertraline but she was concerned about potential affects on her heart, so she requested to restart topiramate.  After a month, reported ineffective, so she was started on Depakote.  However, she never started it after reading the side effects on the patient advisory.  She has prolonged QT interval and therefore do not want to start a TCA.  Treat with meatmucil, oatmeal, 1/2 cup coffee, graham crackers   6/6 cataract surgery -  Wakes up congestion rhinitis  03/18/2022 LABS:  CBC with WBC 7.3, Hgb 11.7, Hct 35.4, PLT 178, LFTs normal,  TSH 4.870, T4 1.04  Current NSAIDS/analgesics:  Tylenol (ineffective) Current triptans:  none Current ergotamine:  none Current anti-emetic:  none Current muscle relaxants:  none Current Antihypertensive medications:  carvedilol, lisinopril, furosemide Current Antidepressant medications:  none.  Would not use TCA as she has prolonged QT interval. Current Anticonvulsant medications:  none Current anti-CGRP:  none Current Vitamins/Herbal/Supplements:  none Current Antihistamines/Decongestants:  Allegra Other therapy:  none Hormone/birth control:  none Other medications:  Eliquis, lorazepam   HISTORY: She began having headaches around 2018 when she moved to [place].  She will get a severe mixed pressure/pounding  headache across forehead and vertex.  Wakes up with it and it dissipates at end of day.  No associated nausea, vomiting, photophobia, phonophobia, unilateral numbness or weakness.  She also may have a persistent pressure over the right forehead.  She underwent a sleep study in 2022 which did not reveal sleep apnea but did demonstrate overnight hypoxemia, suggesting that she may need to wear overnight O2.  She was started on topiramate.  There was question whether she has actually been wearing her oxygen overnight.  She wore an overnight oximetry which was normal.  She was prescribed Fioricet which helps but was told by her previous neurologist not to take it.  She started drinking caffeine every morning but was told by her cardiologist to stop due to her tachycardia.  CT head on 02/16/2021 personally reviewed showed chronic small vessel disease but no acute findings.    Past NSAIDS/analgesics:  Fioricet (effective) Past abortive triptans:  none Past abortive ergotamine:  none Past muscle relaxants:  none Past anti-emetic:  none Past antihypertensive medications:  none Past antidepressant medications:  duloxetine Past anticonvulsant medications:  topiramate, gabapentin Past anti-CGRP:   none Past vitamins/Herbal/Supplements:  none Past antihistamines/decongestants:  none Other past therapies:  Biofreeze, ice packs  PAST MEDICAL HISTORY: Past Medical History:  Diagnosis Date   Anxiety    Arthritis    "fingers, right toe" (08/09/2016)   Atrial fibrillation (HCC)    Basal cell carcinoma of lower leg, right    Bradycardia    a. Holter 08/30/16 showed profound bradycardia down to 30 during awake hours, several 2 second pauses, very frequent PVCs >8000 in 48 hours, and NSVT (longest of 14 beats).  Cardiomyopathy ([place])    a. EF 40% by echo 08/2016.   Daily headache    "I usually wake up w/a headache; sometimes it's a migraine" (08/09/2016)   Depression    Diverticulosis    Hx. of   Frequent  PVCs    GERD (gastroesophageal reflux disease)    Hypercholesterolemia    Migraine    Mitral regurgitation    MVP (mitral valve prolapse)    NSVT (nonsustained ventricular tachycardia) ([place])    a. first noted event monitor 08/2016.   Osteoporosis    PAF (paroxysmal atrial fibrillation) ([place])    a. in afib at time of echo 08/2016.   Pneumonia    Scoliosis    mild   Squamous cell carcinoma of neck    Stroke ([place]) 05/2018    MEDICATIONS: Current Outpatient Medications on File Prior to Visit  Medication Sig Dispense Refill   acetaminophen (TYLENOL) 325 MG tablet Take 325-975 mg by mouth daily.     apixaban (ELIQUIS) 2.5 MG TABS tablet TAKE 1 TABLET(2.5 MG) BY MOUTH TWICE DAILY 60 tablet 5   bismuth subsalicylate (PEPTO BISMOL) 262 MG/15ML suspension Take 30 mLs by mouth as needed.     carvedilol (COREG) 3.125 MG tablet Take 1 tablet (3.125 mg total) by mouth 2 (two) times daily. 180 tablet 3   carvedilol (COREG) 3.125 MG tablet Take 1 tablet (3.125 mg total) by mouth 2 (two) times daily with a meal. 180 tablet 3   cetirizine (ZYRTEC) 5 MG tablet Take 5 mg by mouth daily.     divalproex (DEPAKOTE ER) 250 MG 24 hr tablet Take 1 tablet (250 mg total) by mouth at bedtime. 30 tablet 1   Ensure (ENSURE) Take 237 mLs by mouth daily.      fexofenadine (ALLEGRA) 60 MG tablet Take 1 tablet (60 mg total) by mouth daily. 30 tablet 5   furosemide (LASIX) 20 MG tablet Take 20 mg by mouth as needed for fluid.     lisinopril (ZESTRIL) 2.5 MG tablet TAKE 1 TABLET(2.5 MG) BY MOUTH DAILY 90 tablet 2   LORazepam (ATIVAN) 1 MG tablet Take 1 mg by mouth at bedtime.     oxybutynin (DITROPAN-XL) 10 MG 24 hr tablet Take 10 mg by mouth daily.     Polyethyl Glycol-Propyl Glycol (SYSTANE OP) Apply to eye as needed.     triamcinolone ointment (KENALOG) 0.1 % Apply 1 application topically 2 (two) times daily. 454 g 1   No current facility-administered medications on file prior to visit.     ALLERGIES: Allergies  Allergen Reactions   Omeprazole Other (See Comments)    siezure   Flagyl [Metronidazole] Nausea And Vomiting   Amiodarone    Aripiprazole Other (See Comments)    Unknown reaction   Clindamycin/Lincomycin Other (See Comments)    Bacterial infection / burning in throat   Gabapentin Itching   Hylan G-F 20 Other (See Comments)    Unknown reaction   Metoprolol Tartrate    Paroxetine    Paroxetine Hcl Other (See Comments)    Headaches (a long time ago- pt doesn't really remember)   Statins Other (See Comments)    No specific reaction given-patient states that she was advised not to take by physician   Sulfa Antibiotics Diarrhea and Other (See Comments)    Colitis    Toprol Xl [Metoprolol Succinate] Other (See Comments)    Dizziness--pt doesn't really remember    Vancomycin Itching and Other (See Comments)    Pt  reports that they gave it too fast- she started having itching in the scalp   Penicillins Rash    DID THE REACTION INVOLVE: Swelling of the face/tongue/throat, SOB, or low BP? No Sudden or severe rash/hives, skin peeling, or the inside of the mouth or nose? Yes Did it require medical treatment? Unknown When did it last happen?  Over 10 years     If all above answers are "NO", may proceed with cephalosporin use.     FAMILY HISTORY: Family History  Problem Relation Age of Onset   Heart failure Mother    Leukemia Mother    Heart failure Father    COPD Father    Alzheimer's disease Father    Alzheimer's disease Brother    Heart Problems Brother        stent   Heart Problems Son        stent    Allergic rhinitis Neg Hx    Angioedema Neg Hx    Asthma Neg Hx    Eczema Neg Hx    Atopy Neg Hx    Immunodeficiency Neg Hx    Urticaria Neg Hx    Headache Neg Hx       Objective:  Blood pressure (!) 88/62, pulse 86, height '5\' 4"'$  (1.626 m), weight 116 lb 10.4 oz (52.9 kg), SpO2 93 %. General: No acute distress.  Patient appears well-groomed.       Metta Clines, DO  CC: Allyn Kenner, MD

## 2022-04-29 ENCOUNTER — Ambulatory Visit: Payer: Medicare Other | Admitting: Emergency Medicine

## 2022-04-29 ENCOUNTER — Encounter: Payer: Self-pay | Admitting: Emergency Medicine

## 2022-04-29 ENCOUNTER — Ambulatory Visit (INDEPENDENT_AMBULATORY_CARE_PROVIDER_SITE_OTHER): Payer: Medicare Other

## 2022-04-29 VITALS — BP 83/52 | HR 66 | Temp 97.5°F | Ht 64.0 in | Wt 116.4 lb

## 2022-04-29 DIAGNOSIS — J9611 Chronic respiratory failure with hypoxia: Secondary | ICD-10-CM

## 2022-04-29 DIAGNOSIS — R0609 Other forms of dyspnea: Secondary | ICD-10-CM | POA: Diagnosis not present

## 2022-04-29 DIAGNOSIS — J439 Emphysema, unspecified: Secondary | ICD-10-CM | POA: Diagnosis not present

## 2022-04-29 DIAGNOSIS — R0602 Shortness of breath: Secondary | ICD-10-CM | POA: Diagnosis not present

## 2022-04-29 MED ORDER — ALBUTEROL SULFATE HFA 108 (90 BASE) MCG/ACT IN AERS: 2.0000 | INHALATION_SPRAY | Freq: Four times a day (QID) | RESPIRATORY_TRACT | 6 refills | Status: AC | PRN

## 2022-04-29 NOTE — Patient Instructions (Signed)
We will perform a chest x-ray today to ensure no changes since your last imaging We will do a trial of albuterol to see if it benefits your breathing.  You can use 2 puffs if needed for shortness of breath. Agree with evaluation by gastroenterology.  I believe that your abdominal distention is contributing in part to your difficulty taking deep breaths, difficulty breathing and exerting yourself. Follow with Dr. Lamonte Sakai in 2 months or sooner if you have any problems.

## 2022-04-29 NOTE — Progress Notes (Signed)
Subjective:    Patient ID: Carol Hardin, female    DOB: Mar 15, 1933, 88 y.o.   MRN: [id number]  HPI 88 year old woman with history of former tobacco (20 pack years), atrial fibrillation and tachy-brady syndrome and AV nodal ablation plus pacer, chronic systolic CHF (EF 95-62%), MVP with mitral regurgitation, GERD.  She is referred today to discuss possible amiodarone toxicity.  She was started on amiodarone in March while admitted to [facility], continued about a month. She describes quick development of GI sx > nausea, constipation, GERD / emesis. She improved when the amiodarone was stopped, but she still intermittently has symptoms and ascribes them to the drug. She states that she has a good appetite but he cannot gain her weight back   ROV 10/07/21 --follow-up visit 88 year old woman, former smoker (20 pk-yrs) with A. fib and prior amiodarone use, systolic CHF, GERD.  I saw her in early 2021 and a high-resolution CT scan of the chest was reassuring without any evidence of amiodarone change or ILD.  There may been some air trapping.  She had normal spirometry 01/07/2020. She was seen at Allergy office and they had trouble getting her SpO2 to read, ? Hypoxemia.  She is on stable dose lasix On allegra ONO on 2L/min 08/06/21 > no desaturations, plan to continue 2L/min PSG 06/26/21 >> AHI 1.9/hour, she did have documented hypoxemia.    ROV 04/29/22 --88 year old woman with history tobacco, A-fib with tachybradycardia syndrome, AV nodal ablation and pacer, chronic systolic CHF, mitral valve disease, GERD.  She has been on amiodarone in the past.  No evidence of associated ILD on chest imaging.  Her pulmonary function testing has been reassuring.  No evidence of hypoxemia when she was here 09/2021.  She returns today reporting that she is having some increased restrictive sx from some abd distension and fullness, keeping her from taking a deep breath. She is feeling more SOB with this. She has been seen  by GI to eval abdominal distension, but getting testing done has been difficult due to anesthesia barriers and her age. She is on a stable Cards regimen.    Review of Systems She has dyspnea w exertion, housework.  She denies any CP Minimal cough    Past Medical History:  Diagnosis Date   Anxiety    Arthritis    "fingers, right toe" (08/09/2016)   Atrial fibrillation (HCC)    Basal cell carcinoma of lower leg, right    Bradycardia    a. Holter 08/30/16 showed profound bradycardia down to 30 during awake hours, several 2 second pauses, very frequent PVCs >8000 in 48 hours, and NSVT (longest of 14 beats).   Cardiomyopathy ([place])    a. EF 40% by echo 08/2016.   Daily headache    "I usually wake up w/a headache; sometimes it's a migraine" (08/09/2016)   Depression    Diverticulosis    Hx. of   Frequent PVCs    GERD (gastroesophageal reflux disease)    Hypercholesterolemia    Migraine    Mitral regurgitation    MVP (mitral valve prolapse)    NSVT (nonsustained ventricular tachycardia) ([place])    a. first noted event monitor 08/2016.   Osteoporosis    PAF (paroxysmal atrial fibrillation) ([place])    a. in afib at time of echo 08/2016.   Pneumonia    Scoliosis    mild   Squamous cell carcinoma of neck    Stroke ([place]) 05/2018     Family History  Problem  Relation Age of Onset   Heart failure Mother    Leukemia Mother    Heart failure Father    COPD Father    Alzheimer's disease Father    Alzheimer's disease Brother    Heart Problems Brother        stent   Heart Problems Son        stent    Allergic rhinitis Neg Hx    Angioedema Neg Hx    Asthma Neg Hx    Eczema Neg Hx    Atopy Neg Hx    Immunodeficiency Neg Hx    Urticaria Neg Hx    Headache Neg Hx      Social History   Socioeconomic History   Marital status: Married    Spouse name: Not on file   Number of children: 4   Years of education: 2.5 yrs of college   Highest education level: Not on file   Occupational History   Not on file  Tobacco Use   Smoking status: Former    Packs/day: 1.00    Years: 20.00    Total pack years: 20.00    Types: Cigarettes    Quit date: 13    Years since quitting: 34.5   Smokeless tobacco: Never  Vaping Use   Vaping Use: Never used  Substance and Sexual Activity   Alcohol use: No    Comment: quit 1995, was occasional    Drug use: Never   Sexual activity: Not Currently  Other Topics Concern   Not on file  Social History Narrative   Lives at home with spouse   Caffeine: 2 cups coffee/day   Right handed   Social Determinants of Health   Financial Resource Strain: Medium Risk (08/17/2017)   Overall Financial Resource Strain (CARDIA)    Difficulty of Paying Living Expenses: Somewhat hard  Food Insecurity: No Food Insecurity (08/17/2017)   Hunger Vital Sign    Worried About Running Out of Food in the Last Year: Never true    Ran Out of Food in the Last Year: Never true  Transportation Needs: No Transportation Needs (08/17/2017)   PRAPARE - Hydrologist (Medical): No    Lack of Transportation (Non-Medical): No  Physical Activity: Inactive (08/17/2017)   Exercise Vital Sign    Days of Exercise per Week: 0 days    Minutes of Exercise per Session: 0 min  Stress: Stress Concern Present (08/17/2017)   [place]    Feeling of Stress : Rather much  Social Connections: Moderately Isolated (08/17/2017)   Social Connection and Isolation Panel [NHANES]    Frequency of Communication with Friends and Family: Once a week    Frequency of Social Gatherings with Friends and Family: Once a week    Attends Religious Services: Never    Marine scientist or Organizations: No    Attends Archivist Meetings: Never    Marital Status: Married  Human resources officer Violence: Not At Risk (08/17/2017)   Humiliation, Afraid, Rape, and Kick questionnaire    Fear  of Current or Ex-Partner: No    Emotionally Abused: No    Physically Abused: No    Sexually Abused: No     Allergies  Allergen Reactions   Omeprazole Other (See Comments)    siezure   Flagyl [Metronidazole] Nausea And Vomiting   Amiodarone    Aripiprazole Other (See Comments)    Unknown reaction  Clindamycin/Lincomycin Other (See Comments)    Bacterial infection / burning in throat   Gabapentin Itching   Hylan G-F 20 Other (See Comments)    Unknown reaction   Metoprolol Tartrate    Paroxetine    Paroxetine Hcl Other (See Comments)    Headaches (a long time ago- pt doesn't really remember)   Statins Other (See Comments)    No specific reaction given-patient states that she was advised not to take by physician   Sulfa Antibiotics Diarrhea and Other (See Comments)    Colitis    Toprol Xl [Metoprolol Succinate] Other (See Comments)    Dizziness--pt doesn't really remember    Vancomycin Itching and Other (See Comments)    Pt reports that they gave it too fast- she started having itching in the scalp   Penicillins Rash    DID THE REACTION INVOLVE: Swelling of the face/tongue/throat, SOB, or low BP? No Sudden or severe rash/hives, skin peeling, or the inside of the mouth or nose? Yes Did it require medical treatment? Unknown When did it last happen?  Over 10 years     If all above answers are "NO", may proceed with cephalosporin use.      Outpatient Medications Prior to Visit  Medication Sig Dispense Refill   acetaminophen (TYLENOL) 325 MG tablet Take 325-975 mg by mouth daily.     apixaban (ELIQUIS) 2.5 MG TABS tablet TAKE 1 TABLET(2.5 MG) BY MOUTH TWICE DAILY 60 tablet 5   bismuth subsalicylate (PEPTO BISMOL) 262 MG/15ML suspension Take 30 mLs by mouth as needed.     carvedilol (COREG) 3.125 MG tablet Take 1 tablet (3.125 mg total) by mouth 2 (two) times daily with a meal. 180 tablet 3   Ensure (ENSURE) Take 237 mLs by mouth daily.      fexofenadine (ALLEGRA) 60 MG tablet  Take 1 tablet (60 mg total) by mouth daily. 30 tablet 5   furosemide (LASIX) 20 MG tablet Take 20 mg by mouth as needed for fluid.     lisinopril (ZESTRIL) 2.5 MG tablet TAKE 1 TABLET(2.5 MG) BY MOUTH DAILY 90 tablet 2   LORazepam (ATIVAN) 1 MG tablet Take 1 mg by mouth at bedtime.     Polyethyl Glycol-Propyl Glycol (SYSTANE OP) Apply to eye as needed.     triamcinolone ointment (KENALOG) 0.1 % Apply 1 application topically 2 (two) times daily. 454 g 1   Vibegron (GEMTESA) 75 MG TABS Take 75 mg by mouth daily.     cetirizine (ZYRTEC) 5 MG tablet Take 5 mg by mouth daily. (Patient not taking: Reported on 04/29/2022)     carvedilol (COREG) 3.125 MG tablet Take 1 tablet (3.125 mg total) by mouth 2 (two) times daily. 180 tablet 3   divalproex (DEPAKOTE ER) 250 MG 24 hr tablet Take 1 tablet (250 mg total) by mouth at bedtime. 30 tablet 1   oxybutynin (DITROPAN-XL) 10 MG 24 hr tablet Take 10 mg by mouth daily.     No facility-administered medications prior to visit.        Objective:   Physical Exam  Vitals:   04/29/22 1459  BP: (!) 83/52  Pulse: 66  Temp: (!) 97.5 F (36.4 C)  TempSrc: Oral  SpO2: 91%  Weight: 116 lb 6.4 oz (52.8 kg)  Height: '5\' 4"'$  (1.626 m)    Gen: Pleasant, quite thin, in no distress,  Anxious affect  ENT: No lesions,  mouth clear,  oropharynx clear, no postnasal drip  Neck: No JVD, no  stridor  Lungs: No use of accessory muscles, no crackles or wheezing on normal respiration, no wheeze on forced expiration  Cardiovascular: RRR, heart sounds normal, no murmur or gallops, no peripheral edema  Musculoskeletal: No deformities, no cyanosis or clubbing  Neuro: alert, awake, non focal  Skin: Warm, no lesions or rash      Assessment & Plan:  Dyspnea on exertion She had reassuring spirometry in the past, unclear contributors to her dyspnea.  Reassuring cardiac evaluation recently as well.  She does have some increased abdominal bloating and possibly some  restrictive physiology.  I think there is little downside in trying albuterol to see if she gets benefit.  We will do a trial.  Perform a chest x-ray today to ensure no other cause for progressive restriction like an elevated hemidiaphragm.  We will hold off on repeat pulmonary function testing for now   We will perform a chest x-ray today to ensure no changes since your last imaging We will do a trial of albuterol to see if it benefits your breathing.  You can use 2 puffs if needed for shortness of breath. Agree with evaluation by gastroenterology.  I believe that your abdominal distention is contributing in part to your difficulty taking deep breaths, difficulty breathing and exerting yourself. Follow with Dr. Lamonte Sakai in 2 months or sooner if you have any problems.   Baltazar Apo, MD, PhD 04/29/2022, 5:10 PM [facility] [phone] or if no answer [phone]

## 2022-04-29 NOTE — Assessment & Plan Note (Signed)
She had reassuring spirometry in the past, unclear contributors to her dyspnea.  Reassuring cardiac evaluation recently as well.  She does have some increased abdominal bloating and possibly some restrictive physiology.  I think there is little downside in trying albuterol to see if she gets benefit.  We will do a trial.  Perform a chest x-ray today to ensure no other cause for progressive restriction like an elevated hemidiaphragm.  We will hold off on repeat pulmonary function testing for now   We will perform a chest x-ray today to ensure no changes since your last imaging We will do a trial of albuterol to see if it benefits your breathing.  You can use 2 puffs if needed for shortness of breath. Agree with evaluation by gastroenterology.  I believe that your abdominal distention is contributing in part to your difficulty taking deep breaths, difficulty breathing and exerting yourself. Follow with Dr. Lamonte Sakai in 2 months or sooner if you have any problems.

## 2022-04-30 ENCOUNTER — Ambulatory Visit: Payer: Medicare Other | Admitting: Neurology

## 2022-04-30 ENCOUNTER — Encounter: Payer: Self-pay | Admitting: Neurology

## 2022-04-30 VITALS — BP 88/62 | HR 86 | Ht 64.0 in | Wt 116.7 lb

## 2022-04-30 DIAGNOSIS — Z79899 Other long term (current) drug therapy: Secondary | ICD-10-CM

## 2022-04-30 MED ORDER — DIVALPROEX SODIUM ER 250 MG PO TB24: 250.0000 mg | ORAL_TABLET | Freq: Every day | ORAL | 5 refills | Status: DC

## 2022-04-30 NOTE — Patient Instructions (Signed)
Start divalproex '250mg'$  at bedtime.  Check CBC and hepatic panel in 3 months. Follow up in 4 months.

## 2022-05-03 DIAGNOSIS — K5909 Other constipation: Secondary | ICD-10-CM | POA: Diagnosis not present

## 2022-05-03 DIAGNOSIS — R946 Abnormal results of thyroid function studies: Secondary | ICD-10-CM | POA: Diagnosis not present

## 2022-05-03 DIAGNOSIS — J310 Chronic rhinitis: Secondary | ICD-10-CM | POA: Diagnosis not present

## 2022-05-03 DIAGNOSIS — I4821 Permanent atrial fibrillation: Secondary | ICD-10-CM | POA: Diagnosis not present

## 2022-05-03 DIAGNOSIS — R634 Abnormal weight loss: Secondary | ICD-10-CM | POA: Diagnosis not present

## 2022-05-03 DIAGNOSIS — J9611 Chronic respiratory failure with hypoxia: Secondary | ICD-10-CM | POA: Diagnosis not present

## 2022-05-03 DIAGNOSIS — M810 Age-related osteoporosis without current pathological fracture: Secondary | ICD-10-CM | POA: Diagnosis not present

## 2022-05-03 DIAGNOSIS — R600 Localized edema: Secondary | ICD-10-CM | POA: Diagnosis not present

## 2022-05-03 DIAGNOSIS — I5022 Chronic systolic (congestive) heart failure: Secondary | ICD-10-CM | POA: Diagnosis not present

## 2022-05-03 DIAGNOSIS — E441 Mild protein-calorie malnutrition: Secondary | ICD-10-CM | POA: Diagnosis not present

## 2022-05-03 DIAGNOSIS — G4700 Insomnia, unspecified: Secondary | ICD-10-CM | POA: Diagnosis not present

## 2022-05-03 DIAGNOSIS — R519 Headache, unspecified: Secondary | ICD-10-CM | POA: Diagnosis not present

## 2022-05-04 ENCOUNTER — Telehealth: Payer: Self-pay | Admitting: Neurology

## 2022-05-04 NOTE — Telephone Encounter (Signed)
Pt called no answer no voice mail picked up

## 2022-05-04 NOTE — Telephone Encounter (Signed)
Patient called and stated she saw Dr on Fri, started taking a new medicine last night.  She has some questions about the medication.

## 2022-05-05 NOTE — Telephone Encounter (Signed)
Patient states she started taking the Depakote on Monday night , But she woke up with the worst headache she ever had.  Patient wanted to know if she should still have a cup of Coffee with the Depakote?  Please

## 2022-05-05 NOTE — Telephone Encounter (Signed)
LMOVM for patient, For someone with daily headaches, we recommend to refrain from caffeine. If she absolutely needs caffeine to treat a headache, she only has 2 days out of the week to do so.

## 2022-05-07 NOTE — Telephone Encounter (Signed)
Spoke to patient she did received our VM.

## 2022-05-13 ENCOUNTER — Encounter: Payer: Self-pay | Admitting: Gastroenterology

## 2022-05-13 ENCOUNTER — Ambulatory Visit (INDEPENDENT_AMBULATORY_CARE_PROVIDER_SITE_OTHER): Payer: Medicare Other | Admitting: Gastroenterology

## 2022-05-13 VITALS — BP 80/52 | HR 68 | Temp 97.5°F | Ht 63.0 in | Wt 118.6 lb

## 2022-05-13 DIAGNOSIS — R0902 Hypoxemia: Secondary | ICD-10-CM | POA: Diagnosis not present

## 2022-05-13 DIAGNOSIS — R0602 Shortness of breath: Secondary | ICD-10-CM | POA: Diagnosis not present

## 2022-05-13 DIAGNOSIS — R0600 Dyspnea, unspecified: Secondary | ICD-10-CM | POA: Diagnosis not present

## 2022-05-13 DIAGNOSIS — R634 Abnormal weight loss: Secondary | ICD-10-CM

## 2022-05-13 DIAGNOSIS — I5020 Unspecified systolic (congestive) heart failure: Secondary | ICD-10-CM | POA: Diagnosis not present

## 2022-05-13 NOTE — Progress Notes (Signed)
Gastroenterology Office Note     Primary Care Physician:  Celene Squibb, MD  Primary Gastroenterologist: Dr. Abbey Chatters    Chief Complaint   Chief Complaint  Patient presents with   Follow-up    Pt here for weight loss and decreased appeptite, also pt hasn't been here in a year     History of Present Illness   Carol Hardin is an 88 y.o. female presenting today in follow-up with a history of chronic constipation, GERD, abdominal bloating, dysphagia, and unintentional weight loss. CTA without evidence for chronic mesenteric ischemia. GES normal. Not a candidate for anesthesia at [facility]. Celiac serologies negative.      Weight June 2020: 135 Sept 2020: 132 Dec 2020 127 June 2022: 119  Today: 118    Appetite has been good per patient. Takes 1 teaspoon of Metamucil, oatmeal, graham crackers. Wakes with headaches. Will also drink 1/2 cup of coffee. Drinks 1 ensure a day. States eats 3 meals a day.     Past Medical History:  Diagnosis Date   Anxiety    Arthritis    "fingers, right toe" (08/09/2016)   Atrial fibrillation (HCC)    Basal cell carcinoma of lower leg, right    Bradycardia    a. Holter 08/30/16 showed profound bradycardia down to 30 during awake hours, several 2 second pauses, very frequent PVCs >8000 in 48 hours, and NSVT (longest of 14 beats).   Cardiomyopathy ([place])    a. EF 40% by echo 08/2016.   Daily headache    "I usually wake up w/a headache; sometimes it's a migraine" (08/09/2016)   Depression    Diverticulosis    Hx. of   Frequent PVCs    GERD (gastroesophageal reflux disease)    Hypercholesterolemia    Migraine    Mitral regurgitation    MVP (mitral valve prolapse)    NSVT (nonsustained ventricular tachycardia) ([place])    a. first noted event monitor 08/2016.   Osteoporosis    PAF (paroxysmal atrial fibrillation) ([place])    a. in afib at time of echo 08/2016.   Pneumonia    Scoliosis    mild   Squamous cell carcinoma of neck     Stroke ([place]) 05/2018    Past Surgical History:  Procedure Laterality Date   AV NODE ABLATION N/A 01/23/2019   Procedure: AV NODE ABLATION;  Surgeon: Thompson Grayer, MD;  Location: [facility];  Service: Cardiovascular;  Laterality: N/A;   BASAL CELL CARCINOMA EXCISION Right 1980s   RLE   BIV PACEMAKER INSERTION CRT-P N/A 01/23/2019   Procedure: BIV PACEMAKER INSERTION CRT-P;  Surgeon: Thompson Grayer, MD;  Location: [facility];  Service: Cardiovascular;  Laterality: N/A;   BLEPHAROPLASTY     BREAST BIOPSY Left 1970s   BREAST CYST ASPIRATION Left 1970s   BREAST CYST EXCISION Left 1970s   BUNIONECTOMY Bilateral    CATARACT EXTRACTION W/PHACO Right 04/05/2022   Procedure: CATARACT EXTRACTION PHACO AND INTRAOCULAR LENS PLACEMENT (IOC);  Surgeon: Baruch Goldmann, MD;  Location: [facility];  Service: Ophthalmology;  Laterality: Right;  CDE 11.93   DILATION AND CURETTAGE OF UTERUS     EXCISIONAL HEMORRHOIDECTOMY  1970s   INTRAMEDULLARY (IM) NAIL INTERTROCHANTERIC Left 12/31/2017   Procedure: INTRAMEDULLARY (IM) NAIL INTERTROCHANTRIC;  Surgeon: Nicholes Stairs, MD;  Location: [facility];  Service: Orthopedics;  Laterality: Left;   KNEE ARTHROSCOPY Right 04/12/2007   KNEE ARTHROSCOPY Left 1998   SQUAMOUS CELL CARCINOMA  EXCISION     "neck"   TONSILLECTOMY      Current Outpatient Medications  Medication Sig Dispense Refill   acetaminophen (TYLENOL) 325 MG tablet Take 325-975 mg by mouth daily.     albuterol (VENTOLIN HFA) 108 (90 Base) MCG/ACT inhaler Inhale 2 puffs into the lungs every 6 (six) hours as needed for wheezing or shortness of breath. 8 g 6   apixaban (ELIQUIS) 2.5 MG TABS tablet TAKE 1 TABLET(2.5 MG) BY MOUTH TWICE DAILY 60 tablet 5   bismuth subsalicylate (PEPTO BISMOL) 262 MG/15ML suspension Take 30 mLs by mouth as needed.     carvedilol (COREG) 3.125 MG tablet Take 1 tablet (3.125 mg total) by mouth 2 (two) times daily with a meal. 180 tablet 3   divalproex (DEPAKOTE  ER) 250 MG 24 hr tablet Take 1 tablet (250 mg total) by mouth daily. 30 tablet 5   Ensure (ENSURE) Take 237 mLs by mouth daily.     fexofenadine (ALLEGRA) 60 MG tablet Take 1 tablet (60 mg total) by mouth daily. 30 tablet 5   lisinopril (ZESTRIL) 2.5 MG tablet TAKE 1 TABLET(2.5 MG) BY MOUTH DAILY 90 tablet 2   LORazepam (ATIVAN) 1 MG tablet Take 1 mg by mouth at bedtime.     Polyethyl Glycol-Propyl Glycol (SYSTANE OP) Apply to eye as needed.     triamcinolone ointment (KENALOG) 0.1 % Apply 1 application topically 2 (two) times daily. 454 g 1   Vibegron (GEMTESA) 75 MG TABS Take 75 mg by mouth daily.     furosemide (LASIX) 20 MG tablet Take 20 mg by mouth as needed for fluid.     No current facility-administered medications for this visit.    Allergies as of 05/13/2022 - Review Complete 05/13/2022  Allergen Reaction Noted   Omeprazole Other (See Comments) 09/01/2016   Flagyl [metronidazole] Nausea And Vomiting 08/19/2016   Amiodarone  03/30/2019   Aripiprazole Other (See Comments) 10/09/2013   Clindamycin/lincomycin Other (See Comments) 04/01/2022   Gabapentin Itching 04/01/2022   Hylan g-f 20 Other (See Comments) 10/09/2013   Metoprolol tartrate  02/16/2021   Paroxetine  02/16/2021   Paroxetine hcl Other (See Comments) 04/23/2011   Statins Other (See Comments) 10/18/2018   Sulfa antibiotics Diarrhea and Other (See Comments) 05/19/2018   Toprol xl [metoprolol succinate] Other (See Comments) 04/23/2011   Vancomycin Itching and Other (See Comments) 04/23/2011   Penicillins Rash     Family History  Problem Relation Age of Onset   Heart failure Mother    Leukemia Mother    Heart failure Father    COPD Father    Alzheimer's disease Father    Alzheimer's disease Brother    Heart Problems Brother        stent   Heart Problems Son        stent    Allergic rhinitis Neg Hx    Angioedema Neg Hx    Asthma Neg Hx    Eczema Neg Hx    Atopy Neg Hx    Immunodeficiency Neg Hx     Urticaria Neg Hx    Headache Neg Hx     Social History   Socioeconomic History   Marital status: Married    Spouse name: Not on file   Number of children: 4   Years of education: 2.5 yrs of college   Highest education level: Not on file  Occupational History   Not on file  Tobacco Use   Smoking status: Former  Packs/day: 1.00    Years: 20.00    Total pack years: 20.00    Types: Cigarettes    Quit date: 91    Years since quitting: 34.6   Smokeless tobacco: Never  Vaping Use   Vaping Use: Never used  Substance and Sexual Activity   Alcohol use: No    Comment: quit 1995, was occasional    Drug use: Never   Sexual activity: Not Currently  Other Topics Concern   Not on file  Social History Narrative   Lives at home with spouse   Caffeine: 2 cups coffee/day   Right handed   Social Determinants of Health   Financial Resource Strain: Medium Risk (08/17/2017)   Overall Financial Resource Strain (CARDIA)    Difficulty of Paying Living Expenses: Somewhat hard  Food Insecurity: No Food Insecurity (08/17/2017)   Hunger Vital Sign    Worried About Running Out of Food in the Last Year: Never true    Ran Out of Food in the Last Year: Never true  Transportation Needs: No Transportation Needs (08/17/2017)   PRAPARE - Hydrologist (Medical): No    Lack of Transportation (Non-Medical): No  Physical Activity: Inactive (08/17/2017)   Exercise Vital Sign    Days of Exercise per Week: 0 days    Minutes of Exercise per Session: 0 min  Stress: Stress Concern Present (08/17/2017)   [place]    Feeling of Stress : Rather much  Social Connections: Moderately Isolated (08/17/2017)   Social Connection and Isolation Panel [NHANES]    Frequency of Communication with Friends and Family: Once a week    Frequency of Social Gatherings with Friends and Family: Once a week    Attends Religious  Services: Never    Marine scientist or Organizations: No    Attends Archivist Meetings: Never    Marital Status: Married  Human resources officer Violence: Not At Risk (08/17/2017)   Humiliation, Afraid, Rape, and Kick questionnaire    Fear of Current or Ex-Partner: No    Emotionally Abused: No    Physically Abused: No    Sexually Abused: No     Review of Systems   Gen: Denies any fever, chills, fatigue, weight loss, lack of appetite.  CV: Denies chest pain, heart palpitations, peripheral edema, syncope.  Resp: Denies shortness of breath at rest or with exertion. Denies wheezing or cough.  GI: see HPI GU : Denies urinary burning, urinary frequency, urinary hesitancy MS: Denies joint pain, muscle weakness, cramps, or limitation of movement.  Derm: Denies rash, itching, dry skin Psych: Denies depression, anxiety, memory loss, and confusion Heme: Denies bruising, bleeding, and enlarged lymph nodes.   Physical Exam   BP (!) 80/52   Pulse 68   Temp (!) 97.5 F (36.4 C)   Ht '5\' 3"'$  (1.6 m)   Wt 118 lb 9.6 oz (53.8 kg)   BMI 21.01 kg/m  General:   Alert and oriented. Pleasant and cooperative. Well-nourished and well-developed.  Head:  Normocephalic and atraumatic. Eyes:  Without icterus Abdomen:  +BS, soft, non-tender and non-distended. No HSM noted. No guarding or rebound. No masses appreciated.  Rectal:  Deferred  Msk:  Symmetrical without gross deformities. Normal posture. Extremities:  Without edema. Neurologic:  Alert and  oriented x4;  grossly normal neurologically. Skin:  Intact without significant lesions or rashes. Psych:  Alert and cooperative. Normal mood and affect.   Assessment  Carol Hardin is an 88 y.o. female presenting today in follow-up with a history of  88 y.o. female presenting today in follow-up with a history of chronic constipation, GERD, abdominal bloating, dysphagia, and unintentional weight loss. CTA without evidence for chronic  mesenteric ischemia. GES normal. Not a candidate for anesthesia at [facility]. Celiac serologies negative.   Weight stabilized over the past year. We do not have much more additional to offer from a GI standpoint; she is not a candidate for anesthesia at [facility] due to cardiac history. We had discussed an EGD, but at this point, the risks outweigh benefits.   As weight is stable, will have her return in 3 months. I have asked that she increase Ensure to twice a day.      PLAN    Increase Ensure to BID Call if further weight loss 3 month return for weight check    Annitta Needs, PhD, ANP-BC [facility]

## 2022-05-13 NOTE — Patient Instructions (Signed)
I recommend 2 Ensure drinks a day (in between your meals as snacks).  Please message me if you need anything!  I will see you in 3 months!  I enjoyed seeing you again today! As you know, I value our relationship and want to provide genuine, compassionate, and quality care. I welcome your feedback. If you receive a survey regarding your visit,  I greatly appreciate you taking time to fill this out. See you next time!  Annitta Needs, PhD, ANP-BC [facility]

## 2022-05-21 ENCOUNTER — Telehealth: Payer: Self-pay | Admitting: Neurology

## 2022-05-21 ENCOUNTER — Ambulatory Visit: Payer: Medicare Other | Admitting: Family Medicine

## 2022-05-21 ENCOUNTER — Encounter: Payer: Self-pay | Admitting: Family Medicine

## 2022-05-21 VITALS — BP 82/64 | HR 69 | Temp 98.8°F | Resp 16 | Ht 63.25 in | Wt 116.1 lb

## 2022-05-21 DIAGNOSIS — J310 Chronic rhinitis: Secondary | ICD-10-CM

## 2022-05-21 DIAGNOSIS — R609 Edema, unspecified: Secondary | ICD-10-CM | POA: Diagnosis not present

## 2022-05-21 DIAGNOSIS — R0602 Shortness of breath: Secondary | ICD-10-CM

## 2022-05-21 DIAGNOSIS — L299 Pruritus, unspecified: Secondary | ICD-10-CM

## 2022-05-21 NOTE — Patient Instructions (Addendum)
Shortness of breath Continue albuterol 2 puffs once every 4 hours as needed Continue to follow up with Dr. Lamonte Sakai as recommended  Bilateral lower extremity edema If your leg swelling does not resolve after stopping Depakote, follow up with your primary care provider or your cardiology specialist.   Chronic rhinitis Continue Allegra once a day as needed for a runny nose or itch Continue nasal saline rinses once a day as needed Continue ipratropium 2 sprays in each nostril twice a day as needed for runny nose Begin azelastine 2 sprays in each nostril twice a day as needed for runny nose.  This may help your sinus headaches  Scalp pruritus Begin fluocinolone topical oil on your scalp up to twice a day as needed Do not use this medication longer than 2 weeks in a row  Call the clinic if this treatment plan is not working well for you  Follow up in 6 months or sooner if needed.

## 2022-05-21 NOTE — Telephone Encounter (Signed)
Patient called back to let the Dr know that she has only taken 10 pills since she was givent the medication in July.  She states she has pain passing urine, leg swelling, change in balance, and trouble walking.

## 2022-05-21 NOTE — Progress Notes (Unsigned)
[place], [place] [id number] Dept: [phone]  FOLLOW UP NOTE  Patient ID: Carol Hardin, female    DOB: 06-Jan-1933  Age: 88 y.o. MRN: [id number] Date of Office Visit: 05/21/2022  Assessment  Chief Complaint: Allergic Rhinitis  (Runny nose), Headache, and Other (Has been having trouble walking straight. )  HPI Carol Hardin is an 88 year old female who presents to the clinic for a follow up visit. She was last seen in this clinic on 11/20/2021 by Dr. Ernst Bowler for evaluation of shortness of breath with daytime hypoxia, chronic rhinitis, headaches, abdominal pain, concern for insect allergy, and likely dental abscess. She continues to follow up with a registered dietitian, Tamsen Roers, RD, and is currently working on meeting nutrition goals as determined by her treatment plan. She continues to follow up with Dr. Tomi Likens, neurology specialist for headache. She reports that she has recently started divalproex for chronic headache control and has noted some unwanted side effects including swelling in her feet and no change in her headache status. She is currently working with Dr. Tomi Likens regarding a plan to stop Depakote. She continues to experience abdominal bloating for which she follows Wallie Char, NP. She is trying to get in for a EGD, however, there is a 3-4 month wait due to anaesthesia scheduling. She continues to follow up with Dr. Lamonte Sakai, pulmonology specialist, for evaluation and treatment of shortness of breath and hypoxia. She continues albuterol a few times a week with moderate relief of shortness of breath. Her last chest xray on 04/29/2022 indicated "hyperinflation with emphysema and scaring at the bases" and "cardiomegaly". At today's visit, she reports that her allergic rhinitis has been moderately well controlled with clear rhinorrhea as the main symptom. She continues Allegra daily and occasionally uses ipratropium as needed. She does report having a "sinus  headache" upon waking every day that resolves after several hours. 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 She has tried a medicated shampoo which was reportedly effective, however, she has recently had a cataract surgery and is hesitant to use a shampoo that may get into her eye. She reports that she has been cold lately and is enjoying the heater and a cup of tea in the clinic. Her current medications are listed in the chart.    Drug Allergies:  Allergies  Allergen Reactions   Omeprazole Other (See Comments)    siezure   Flagyl [Metronidazole] Nausea And Vomiting   Amiodarone    Aripiprazole Other (See Comments)    Unknown reaction   Clindamycin/Lincomycin Other (See Comments)    Bacterial infection / burning in throat   Gabapentin Itching   Hylan G-F 20 Other (See Comments)    Unknown reaction   Metoprolol Tartrate    Paroxetine    Paroxetine Hcl Other (See Comments)    Headaches (a long time ago- pt doesn't really remember)   Statins Other (See Comments)    No specific reaction given-patient states that she was advised not to take by physician   Sulfa Antibiotics Diarrhea and Other (See Comments)    Colitis    Toprol Xl [Metoprolol Succinate] Other (See Comments)    Dizziness--pt doesn't really remember    Vancomycin Itching and Other (See Comments)    Pt reports that they gave it too fast- she started having itching in the scalp   Penicillins Rash    DID THE REACTION INVOLVE: Swelling of the face/tongue/throat, SOB, or low BP? No Sudden or severe  rash/hives, skin peeling, or the inside of the mouth or nose? Yes Did it require medical treatment? Unknown When did it last happen?  Over 10 years     If all above answers are "NO", may proceed with cephalosporin use.     Physical Exam: BP (!) 82/64   Pulse 69   Temp 98.8 F (37.1 C)   Resp 16   Ht 5' 3.25" (1.607 m)   Wt 116 lb 2 oz (52.7 kg)   SpO2 96%   BMI 20.41 kg/m    Physical Exam Vitals and nursing note  reviewed.  Constitutional:      Appearance: Normal appearance. She is well-developed.  HENT:     Head: Normocephalic and atraumatic.     Right Ear: Tympanic membrane normal.     Left Ear: Tympanic membrane normal.     Nose:     Comments: Bilateral nares slightly erythematous with thin clear nasal drainage noted. Pharynx slightly erythematous with no exudate. Ears normal. Eyes normal.  Eyes:     Extraocular Movements: Extraocular movements intact.     Pupils: Pupils are equal, round, and reactive to light.  Cardiovascular:     Rate and Rhythm: Rhythm irregular.  Pulmonary:     Effort: Pulmonary effort is normal.     Breath sounds: Normal breath sounds.     Comments: Lungs clear to auscultation Musculoskeletal:     Cervical back: Normal range of motion and neck supple.     Comments: Bilateral lower extremity pitting edema  Skin:    General: Skin is warm and dry.     Comments: Pallor. Extremities cool. Warmed up during the appointment  Neurological:     Mental Status: She is alert and oriented to person, place, and time.  Psychiatric:        Mood and Affect: Mood normal.        Behavior: Behavior normal.        Thought Content: Thought content normal.        Judgment: Judgment normal.     Assessment and Plan: 1. Chronic rhinitis   2. Shortness of breath   3. Edema, unspecified type   4. Pruritus of scalp     Meds ordered this encounter  Medications   Fluocinolone Acetonide Scalp 0.01 % OIL    Sig: Apply 1 Application topically 2 (two) times daily as needed.    Dispense:  118.28 mL    Refill:  5    Patient Instructions  Shortness of breath Continue albuterol 2 puffs once every 4 hours as needed Continue to follow up with Dr. Lamonte Sakai as recommended  Bilateral lower extremity edema If your leg swelling does not resolve after stopping Depakote, follow up with your primary care provider or your cardiology specialist.   Chronic rhinitis Continue Allegra once a day as  needed for a runny nose or itch Continue nasal saline rinses once a day as needed Continue ipratropium 2 sprays in each nostril twice a day as needed for runny nose Begin azelastine 2 sprays in each nostril twice a day as needed for runny nose.  This may help your sinus headaches  Scalp pruritus Begin fluocinolone topical oil on your scalp up to twice a day as needed Do not use this medication longer than 2 weeks in a row  Call the clinic if this treatment plan is not working well for you  Follow up in 6 months or sooner if needed.    No follow-ups on file.  Thank you for the opportunity to care for this patient.  Please do not hesitate to contact me with questions.  Gareth Morgan, FNP [facility]

## 2022-05-21 NOTE — Telephone Encounter (Signed)
Has she stopped the medication then? If she has not stopped it, stop Depakote, it is a low dose, no need to taper down. If she has stopped it, those problems she is having are not due to depakote, pls have her see PCP. thanks

## 2022-05-21 NOTE — Telephone Encounter (Signed)
Patient advised of Dr.Aqunio note.

## 2022-05-21 NOTE — Telephone Encounter (Signed)
Pt called in stating she has been taking the divalproex, but it is keeping her from getting rid of the swelling in her feet and her headaches seem to be worse. She saw she shouldn't just stop taking it, so she would like to see how to taper down off of it? If she cannot answer, she would like a detailed message left on her voicemail.

## 2022-05-23 DIAGNOSIS — R609 Edema, unspecified: Secondary | ICD-10-CM | POA: Insufficient documentation

## 2022-05-23 DIAGNOSIS — J310 Chronic rhinitis: Secondary | ICD-10-CM | POA: Insufficient documentation

## 2022-05-23 DIAGNOSIS — L299 Pruritus, unspecified: Secondary | ICD-10-CM | POA: Insufficient documentation

## 2022-05-23 MED ORDER — FLUOCINOLONE ACETONIDE SCALP 0.01 % EX OIL
1.0000 | TOPICAL_OIL | Freq: Two times a day (BID) | CUTANEOUS | 5 refills | Status: DC | PRN
Start: 2022-05-23 — End: 2022-06-26

## 2022-05-25 NOTE — Progress Notes (Signed)
Remote pacemaker transmission.   

## 2022-05-27 ENCOUNTER — Telehealth: Payer: Self-pay | Admitting: Neurology

## 2022-05-27 NOTE — Telephone Encounter (Signed)
Patient called to report swelling in her feet and ankles since stopping Depakote.  She was advised by her allergy PA on 05/21/22 to reach out to Dr. Tomi Likens.

## 2022-05-28 ENCOUNTER — Telehealth: Payer: Self-pay | Admitting: Family Medicine

## 2022-05-28 ENCOUNTER — Other Ambulatory Visit: Payer: Self-pay | Admitting: *Deleted

## 2022-05-28 MED ORDER — AZELASTINE HCL 0.1 % NA SOLN: 2.0000 | Freq: Two times a day (BID) | NASAL | 5 refills | Status: AC | PRN

## 2022-05-28 NOTE — Telephone Encounter (Signed)
Prescription has been sent in. Called and informed patient, patient verbalized understanding.  

## 2022-05-28 NOTE — Telephone Encounter (Signed)
Patient was unable to pick up azelastine from pharmacy. Patient is requesting prescription be sent to [facility] Dr Linna Hoff   Best contact number: [phone]

## 2022-06-01 NOTE — Telephone Encounter (Signed)
Called patient 2x left voicemail message

## 2022-06-01 NOTE — Telephone Encounter (Signed)
Spoke to patient and she is going to follow up with PCP and perhaps another provider

## 2022-06-02 ENCOUNTER — Telehealth: Payer: Self-pay | Admitting: Cardiovascular Disease

## 2022-06-02 DIAGNOSIS — R600 Localized edema: Secondary | ICD-10-CM | POA: Diagnosis not present

## 2022-06-02 DIAGNOSIS — I959 Hypotension, unspecified: Secondary | ICD-10-CM | POA: Diagnosis not present

## 2022-06-02 NOTE — Telephone Encounter (Signed)
Spoke with patient who reported she went to PCP today and was told to call cardiology due to both feet with edema. Patient wears support hose and elevates her legs, with no relief. She believes the edema is caused by the drug divalproex, which she stopped taking 10 days ago. Stated her BP was low at PCP, but didn't remember the numbers. I called PCP and could not get through. Went to Dr. Claiborne Billings (DOD) regarding edema. He ordered lasix '20mg'$  daily until swelling goes down. Called patient back. She told me PCP wanted her to stop taking lasix and lisinopril for 2 days because of BP and lightheadedness in the office today. I told her to do what PCP recommended and that I will check on her tomorrow. She will check her BP later today and tomorrow. Made appointment with Dr. Audie Box for 8/25.

## 2022-06-02 NOTE — Telephone Encounter (Signed)
Pt c/o swelling: STAT is pt has developed SOB within 24 hours  If swelling, where is the swelling located? Feet and legs   How much weight have you gained and in what time span?  Not sure   Have you gained 3 pounds in a day or 5 pounds in a week? Not sure   Do you have a log of your daily weights (if so, list)?   Are you currently taking a fluid pill? no  Are you currently SOB? Yes   Have you traveled recently? No     Pt called stating she went to see her PCP's PA, Vicente Males, yesterday. She states she was on this medication divalproex but stopped taking it after 10 days because she was having side effects to it. Pt states she is now swelling, dizziness when walking (she fears she is going to fall over), and experiencing some SOB a little bit, but not at the moment. She states that at her visit yesterday her BP was in the 80's, she couldn't remember if it was the top or bottom number. Vicente Males advised her not to take lisinopril tomorrow or Friday so her BP can go back up and to contact Dr. Audie Box.

## 2022-06-03 NOTE — Telephone Encounter (Signed)
Spoke with patient to inform her of Dr. Kathalene Frames reply... unless she is gaining weight, she will not need lasix and keep legs elevated. Appointment changed to C. Walker for 8/25. Patient has not gained any weight and stated her foot edema is slightly less today.

## 2022-06-03 NOTE — Progress Notes (Unsigned)
Cardiology Office Note:    Date:  06/05/2022   ID:  Carol Hardin, DOB [birth year], MRN [id number]  PCP:  Celene Squibb, [place] Providers Cardiologist:  Carlyle Dolly, MD Electrophysiologist:  Thompson Grayer, MD     Referring MD: Celene Squibb, MD   CC: Orthostatic dizziness symptoms, worsening shortness of breath, and bilateral lower extremity swelling  History of Present Illness:    Carol Hardin is a 88 y.o. female with a hx of the following:   Hx of SSS, Tachy-brady syndrome, s/p AVN ablation, CRT-P Permanent A-fib Mild to moderate MR HTN Nonsustained VT Hx of Stroke in 2019 HFrEF (LVEF 25% 03/2022) Aortic atherosclerosis NICM CKD stage 3 Hypercholesterolemia GAD Depression Edema   Last office visit on 03/09/2022, she was doing well from a cardiac perspective. BP 100/60. Update 2D Echo revealed LVEF 25%, mild to moderate MR, mild AR with aortic sclerosis, no AS. IVC dilated in size. She has had severe MR in the past. With advanced age, medical management has been recommended for her, including Coreg and Lisinopril. Not a candidate for a defibrillator. Follows EP. At last visit with Dr. Lovena Le in 03/2022, she was tolerating her low dose of Eliquis well (2.5 mg BID), and her PPM was working normally and doing well. No medication changes or labs ordered.   She presents today for CC of orthostatic dizziness symptoms, worsening SHOB, and BLE. Says breathing is worse. Has recently stopped depakote (she has tried 4 different HA medications that she hasn't been able to take) and thinks her symptoms are due to recently stopping Depakote within the last week or two. Says she is SHOB at rest. Has a hard time lying down to sleep at night, has to sleep on her side. Wears 2L of oxygen at night. She is watching her salt intake and her fluid intake. Weighs herself daily and denies any significant weight changes. Weight is down from last vist. Says SBP at home is  mainly in 80s and 90s, reports recent highest SBP was 103. Orthostatics are positive and labile for orthostatic hypotension today in the office, HR labile during orthostatics. Says she has been taking her BP medication as scheduled. Says she experiences dizziness with walking and therefore she is nervous with walking. Hasn't taken Lasix in several days. Her bilateral lower extremity edema has been chronic since her stroke in 2019. Difficult for her to tell if it's worse or better since stopping depakote. Says she has been wearing compression stockings and has been elevating her legs.   Past Medical History:  Diagnosis Date   Anxiety    Arthritis    "fingers, right toe" (08/09/2016)   Atrial fibrillation (HCC)    Basal cell carcinoma of lower leg, right    Bradycardia    a. Holter 08/30/16 showed profound bradycardia down to 30 during awake hours, several 2 second pauses, very frequent PVCs >8000 in 48 hours, and NSVT (longest of 14 beats).   Cardiomyopathy ([place])    a. EF 40% by echo 08/2016.   Daily headache    "I usually wake up w/a headache; sometimes it's a migraine" (08/09/2016)   Depression    Diverticulosis    Hx. of   Frequent PVCs    GERD (gastroesophageal reflux disease)    Hypercholesterolemia    Migraine    Mitral regurgitation    MVP (mitral valve prolapse)    NSVT (nonsustained ventricular tachycardia) ([place])    a. first noted  event monitor 08/2016.   Osteoporosis    PAF (paroxysmal atrial fibrillation) ([place])    a. in afib at time of echo 08/2016.   Pneumonia    Scoliosis    mild   Squamous cell carcinoma of neck    Stroke ([place]) 05/2018    Past Surgical History:  Procedure Laterality Date   AV NODE ABLATION N/A 01/23/2019   Procedure: AV NODE ABLATION;  Surgeon: Thompson Grayer, MD;  Location: [facility];  Service: Cardiovascular;  Laterality: N/A;   BASAL CELL CARCINOMA EXCISION Right 1980s   RLE   BIV PACEMAKER INSERTION CRT-P N/A 01/23/2019   Procedure:  BIV PACEMAKER INSERTION CRT-P;  Surgeon: Thompson Grayer, MD;  Location: [facility];  Service: Cardiovascular;  Laterality: N/A;   BLEPHAROPLASTY     BREAST BIOPSY Left 1970s   BREAST CYST ASPIRATION Left 1970s   BREAST CYST EXCISION Left 1970s   BUNIONECTOMY Bilateral    CATARACT EXTRACTION W/PHACO Right 04/05/2022   Procedure: CATARACT EXTRACTION PHACO AND INTRAOCULAR LENS PLACEMENT (IOC);  Surgeon: Baruch Goldmann, MD;  Location: [facility];  Service: Ophthalmology;  Laterality: Right;  CDE 11.93   DILATION AND CURETTAGE OF UTERUS     EXCISIONAL HEMORRHOIDECTOMY  1970s   INTRAMEDULLARY (IM) NAIL INTERTROCHANTERIC Left 12/31/2017   Procedure: INTRAMEDULLARY (IM) NAIL INTERTROCHANTRIC;  Surgeon: Nicholes Stairs, MD;  Location: [facility];  Service: Orthopedics;  Laterality: Left;   KNEE ARTHROSCOPY Right 04/12/2007   KNEE ARTHROSCOPY Left 1998   SQUAMOUS CELL CARCINOMA EXCISION     "neck"   TONSILLECTOMY      Current Medications: Current Meds  Medication Sig   acetaminophen (TYLENOL) 325 MG tablet Take 325-975 mg by mouth daily.   albuterol (VENTOLIN HFA) 108 (90 Base) MCG/ACT inhaler Inhale 2 puffs into the lungs every 6 (six) hours as needed for wheezing or shortness of breath.   apixaban (ELIQUIS) 2.5 MG TABS tablet TAKE 1 TABLET(2.5 MG) BY MOUTH TWICE DAILY   azelastine (ASTELIN) 0.1 % nasal spray Place 2 sprays into both nostrils 2 (two) times daily as needed for rhinitis.   bismuth subsalicylate (PEPTO BISMOL) 262 MG/15ML suspension Take 30 mLs by mouth as needed.   carvedilol (COREG) 3.125 MG tablet Take 1 tablet (3.125 mg total) by mouth 2 (two) times daily with a meal.   Ensure (ENSURE) Take 237 mLs by mouth daily.   fexofenadine (ALLEGRA) 60 MG tablet Take 1 tablet (60 mg total) by mouth daily.   furosemide (LASIX) 20 MG tablet Take 20 mg by mouth as needed for fluid.   lisinopril (ZESTRIL) 2.5 MG tablet TAKE 1 TABLET(2.5 MG) BY MOUTH DAILY   LORazepam (ATIVAN) 1 MG tablet  Take 1 mg by mouth at bedtime.   Polyethyl Glycol-Propyl Glycol (SYSTANE OP) Apply to eye as needed.   triamcinolone ointment (KENALOG) 0.1 % Apply 1 application topically 2 (two) times daily.   Vibegron (GEMTESA) 75 MG TABS Take 75 mg by mouth daily.     Allergies:   Omeprazole, Flagyl [metronidazole], Amiodarone, Aripiprazole, Clindamycin/lincomycin, Gabapentin, Hylan g-f 20, Metoprolol tartrate, Paroxetine, Paroxetine hcl, Statins, Sulfa antibiotics, Toprol xl [metoprolol succinate], Vancomycin, and Penicillins   Social History   Socioeconomic History   Marital status: Married    Spouse name: Not on file   Number of children: 4   Years of education: 2.5 yrs of college   Highest education level: Not on file  Occupational History   Not on file  Tobacco Use  Smoking status: Former    Packs/day: 1.00    Years: 20.00    Total pack years: 20.00    Types: Cigarettes    Quit date: 62    Years since quitting: 34.6   Smokeless tobacco: Never  Vaping Use   Vaping Use: Never used  Substance and Sexual Activity   Alcohol use: No    Comment: quit 1995, was occasional    Drug use: Never   Sexual activity: Not Currently  Other Topics Concern   Not on file  Social History Narrative   Lives at home with spouse   Caffeine: 2 cups coffee/day   Right handed   Social Determinants of Health   Financial Resource Strain: Medium Risk (08/17/2017)   Overall Financial Resource Strain (CARDIA)    Difficulty of Paying Living Expenses: Somewhat hard  Food Insecurity: No Food Insecurity (08/17/2017)   Hunger Vital Sign    Worried About Running Out of Food in the Last Year: Never true    Ran Out of Food in the Last Year: Never true  Transportation Needs: No Transportation Needs (08/17/2017)   PRAPARE - Hydrologist (Medical): No    Lack of Transportation (Non-Medical): No  Physical Activity: Inactive (08/17/2017)   Exercise Vital Sign    Days of Exercise per Week:  0 days    Minutes of Exercise per Session: 0 min  Stress: Stress Concern Present (08/17/2017)   [place]    Feeling of Stress : Rather much  Social Connections: Moderately Isolated (08/17/2017)   Social Connection and Isolation Panel [NHANES]    Frequency of Communication with Friends and Family: Once a week    Frequency of Social Gatherings with Friends and Family: Once a week    Attends Religious Services: Never    Marine scientist or Organizations: No    Attends Music therapist: Never    Marital Status: Married     Family History: The patient's family history includes Alzheimer's disease in her brother and father; COPD in her father; Heart Problems in her brother and son; Heart failure in her father and mother; Leukemia in her mother. There is no history of Allergic rhinitis, Angioedema, Asthma, Eczema, Atopy, Immunodeficiency, Urticaria, or Headache.  ROS:   Review of Systems  Constitutional:  Positive for malaise/fatigue. Negative for chills, diaphoresis, fever and weight loss.  HENT: Negative.    Eyes: Negative.   Respiratory:  Positive for shortness of breath. Negative for cough, hemoptysis, sputum production and wheezing.   Cardiovascular:  Positive for orthopnea and leg swelling. Negative for chest pain, palpitations, claudication and PND.  Gastrointestinal: Negative.   Genitourinary: Negative.   Musculoskeletal: Negative.   Skin: Negative.   Neurological:  Positive for dizziness. Negative for tingling, tremors, sensory change, speech change, focal weakness, seizures, loss of consciousness, weakness and headaches.  Endo/Heme/Allergies: Negative.   Psychiatric/Behavioral: Negative.     Please see the history of present illness.    All other systems reviewed and are negative.  EKGs/Labs/Other Studies Reviewed:    The following studies were reviewed today:   EKG:  EKG is ordered today.   The ekg ordered today demonstrates ventricular pacing, 84 bpm, wide QRS rhythm with frequent PVCs and premature supraventricular complexes in a pattern of bigeminy, right bundle branch block, with T wave inversion in lead V1.   Recent Labs: 06/04/2022: B Natriuretic Peptide 712.1; BUN 16; Creatinine, Ser 0.89; Hemoglobin  11.5; Platelets 168; Potassium 4.4; Sodium 137  Recent Lipid Panel    Component Value Date/Time   CHOL 134 08/11/2020 0000   TRIG 67 08/11/2020 0000   HDL 41 08/11/2020 0000   CHOLHDL 3.3 01/23/2019 0631   VLDL 13 01/23/2019 0631   LDLCALC 79 08/11/2020 0000     Risk Assessment/Calculations:    CHA2DS2-VASc Score = 8  This indicates a 10.8% annual risk of stroke. The patient's score is based upon: CHF History: 1 HTN History: 1 Diabetes History: 0 Stroke History: 2 Vascular Disease History: 1 Age Score: 2 Gender Score: 1    Physical Exam:    VS:  BP 104/60   Pulse 74   Ht 5' 3.25" (1.607 m)   Wt 115 lb (52.2 kg)   BMI 20.21 kg/m     Wt Readings from Last 3 Encounters:  06/04/22 115 lb 1.3 oz (52.2 kg)  06/04/22 115 lb (52.2 kg)  05/21/22 116 lb 2 oz (52.7 kg)    Orthostatic VS for the past 24 hrs:  BP- Lying Pulse- Lying BP- Sitting Pulse- Sitting BP- Standing at 0 minutes Pulse- Standing at 0 minutes  06/04/22 1514 103/68 (!) 42 124/71 69 102/60 (!) 42  She is symptomatic with these positive, labile orthostatic BP's and heart rates.   GEN: Frail, thin, 88 y.o. Caucasian female, SHOB while talking and sitting in chair  HEENT: Normal NECK: No JVD; No carotid bruits CARDIAC: Irregular rhythm, normal rate; no murmurs, rubs, gallops; 2+ peripheral radial pulses, slow and strong, equal bilaterally. 1+ peripheral PT pulses, equal bilaterally RESPIRATORY:  Diminished bilateral crackles to bibasilar lung fields, no wheezing or rhonchi noted. Short of breath while talking during interview and exam.  ABDOMEN: Firm, rounded, some mild tenderness to  palpation per her report.  MUSCULOSKELETAL: 2+ edema to bilateral lower extremities, some swelling to abdomen; Thoracic kyphosis noted, generalized weakness, otherwise no deformity. SKIN: Appears pale, cool and dry skin, bilateral discoloration (blue/purple hue) to lower extremities (chronic venous insufficiency) NEUROLOGIC:  Alert and oriented x 3 PSYCHIATRIC:  Normal affect, appears anxious  ASSESSMENT:    1. Shortness of breath   2. Acute on chronic systolic heart failure (HCC)   3. HFrEF (heart failure with reduced ejection fraction) ([place])   4. Orthostatic hypotension   5. H/O tachycardia-bradycardia syndrome   6. Edema, unspecified type   7. Chronic venous insufficiency   8. Presence of permanent cardiac pacemaker   9. Ventricular bigeminy    PLAN:    Patient is an 88 y.o. frail, thin, Caucasian female with a PMH of permanent A-fib, SSS/Tachy-brady syndrome, s/p AVN ablation/CRT-P, mild to moderate MR, HTN, nonsustained VT, hx of stroke 2019, aortic atherosclerosis, HFrEF (LVEF 25%), NICM, CKD stage 3, edema, hypercholesterolemia, HTN, GAD, and depression who presents today with CC of worsening SHOB, chronic leg edema, and orthostatic dizziness symptoms. Says her breathing is worse and she experiences shortness of breath at rest and while talking to me. Positive, labile orthostatic blood pressures and heart rates in the office today as noted above. Diminished, bilateral crackles noted on exam along bibasilar lung fields. 2+ edema along BLE, with noted varicose veins, blue/purple skin hue to BLE, with diminished 1+ pulses to PT. Cool dry, skin, appears pale. EKG today revealed ventricular pacing, 84 bpm, wide QRS rhythm with frequent PVCs and premature supraventricular complexes in a pattern of bigeminy, right bundle branch block, with T wave inversion in lead V1.   Discussed treatment options thoroughly with patient  and her husband, including outpatient vs. inpatient management of CHF. I  explained to them that due to patient's presentation, age, thin/frail appearance and comorbidities, and physical exam findings, it would be the best plan of care to send patient to the ED to be evaluated. I explained to them the health risks involved if her presentation and symptoms were to worsen over the weekend, and the importance of getting evaluated in the ED today. They verbalized understanding and are both agreeable to be evaluated in the ED. Patient was sent to the ED for further evaluation of shortness of breath, acute on chronic CHF signs and symptoms, and for further evaluation of her symptomatic, labile orthostatic hypotension and heart rates in the office today as outlined above.    Medication Adjustments/Labs and Tests Ordered: Current medicines are reviewed at length with the patient today.  Concerns regarding medicines are outlined above.  Orders Placed This Encounter  Procedures   EKG 12-Lead   No orders of the defined types were placed in this encounter.   Patient Instructions  We recommend evaluation in the emergency room today.   Medication Instructions:  No changes today.   Lab Work: None ordered today.   Testing/Procedures: EKG today showed your pacemaker is working but also showed PVC's (early beats).   Your orthostatic vital signs today were positive.   Follow-Up: At [facility], you and your health needs are our priority.  As part of our continuing mission to provide you with exceptional heart care, we have created designated Provider Care Teams.  These Care Teams include your primary Cardiologist (physician) and Advanced Practice Providers (APPs -  Physician Assistants and Nurse Practitioners) who all work together to provide you with the care you need, when you need it.  We recommend signing up for the patient portal called "MyChart".  Sign up information is provided on this After Visit Summary.  MyChart is used to connect with patients for Virtual Visits  (Telemedicine).  Patients are able to view lab/test results, encounter notes, upcoming appointments, etc.  Non-urgent messages can be sent to your provider as well.   To learn more about what you can do with MyChart, go to [email or web address].    Your next appointment:   3-4 weeks in person with  Carlyle Dolly, MD or one of the following Advanced Practice Providers on your designated Care Team:   Bernerd Pho, PA-C  Ermalinda Barrios, PA-C {  We can schedule this after you leave the hospital.        Signed, [name], NP  06/05/2022 8:04 AM    [place]

## 2022-06-03 NOTE — Telephone Encounter (Signed)
Noted. Will address at clinic visit.   Loel Dubonnet, NP

## 2022-06-04 ENCOUNTER — Ambulatory Visit (HOSPITAL_BASED_OUTPATIENT_CLINIC_OR_DEPARTMENT_OTHER): Payer: Medicare Other | Admitting: Nurse Practitioner

## 2022-06-04 ENCOUNTER — Encounter (HOSPITAL_BASED_OUTPATIENT_CLINIC_OR_DEPARTMENT_OTHER): Payer: Self-pay

## 2022-06-04 ENCOUNTER — Encounter (HOSPITAL_BASED_OUTPATIENT_CLINIC_OR_DEPARTMENT_OTHER): Payer: Self-pay | Admitting: Nurse Practitioner

## 2022-06-04 ENCOUNTER — Ambulatory Visit: Payer: Medicare Other | Admitting: Cardiovascular Disease

## 2022-06-04 ENCOUNTER — Emergency Department (HOSPITAL_BASED_OUTPATIENT_CLINIC_OR_DEPARTMENT_OTHER)
Admission: EM | Admit: 2022-06-04 | Discharge: 2022-06-04 | Disposition: A | Discharge status: Home / Self Care | Payer: Medicare Other | Attending: Emergency Medicine | Admitting: Emergency Medicine

## 2022-06-04 ENCOUNTER — Emergency Department (HOSPITAL_BASED_OUTPATIENT_CLINIC_OR_DEPARTMENT_OTHER): Payer: Medicare Other | Admitting: Radiology

## 2022-06-04 ENCOUNTER — Other Ambulatory Visit: Payer: Self-pay

## 2022-06-04 VITALS — BP 104/60 | HR 74 | Ht 63.25 in | Wt 115.0 lb

## 2022-06-04 DIAGNOSIS — R0602 Shortness of breath: Secondary | ICD-10-CM

## 2022-06-04 DIAGNOSIS — I5020 Unspecified systolic (congestive) heart failure: Secondary | ICD-10-CM | POA: Diagnosis not present

## 2022-06-04 DIAGNOSIS — Z7901 Long term (current) use of anticoagulants: Secondary | ICD-10-CM | POA: Diagnosis not present

## 2022-06-04 DIAGNOSIS — R609 Edema, unspecified: Secondary | ICD-10-CM | POA: Diagnosis not present

## 2022-06-04 DIAGNOSIS — I498 Other specified cardiac arrhythmias: Secondary | ICD-10-CM | POA: Diagnosis not present

## 2022-06-04 DIAGNOSIS — I872 Venous insufficiency (chronic) (peripheral): Secondary | ICD-10-CM | POA: Diagnosis not present

## 2022-06-04 DIAGNOSIS — I4821 Permanent atrial fibrillation: Secondary | ICD-10-CM

## 2022-06-04 DIAGNOSIS — R5383 Other fatigue: Secondary | ICD-10-CM

## 2022-06-04 DIAGNOSIS — I951 Orthostatic hypotension: Secondary | ICD-10-CM

## 2022-06-04 DIAGNOSIS — J449 Chronic obstructive pulmonary disease, unspecified: Secondary | ICD-10-CM | POA: Diagnosis not present

## 2022-06-04 DIAGNOSIS — Z950 Presence of cardiac pacemaker: Secondary | ICD-10-CM | POA: Diagnosis not present

## 2022-06-04 DIAGNOSIS — Z8679 Personal history of other diseases of the circulatory system: Secondary | ICD-10-CM

## 2022-06-04 DIAGNOSIS — I5023 Acute on chronic systolic (congestive) heart failure: Secondary | ICD-10-CM | POA: Diagnosis not present

## 2022-06-04 DIAGNOSIS — I959 Hypotension, unspecified: Secondary | ICD-10-CM | POA: Diagnosis not present

## 2022-06-04 DIAGNOSIS — J9811 Atelectasis: Secondary | ICD-10-CM | POA: Diagnosis not present

## 2022-06-04 LAB — CBC
HCT: 36.3 % (ref 36.0–46.0)
Hemoglobin: 11.5 g/dL — ABNORMAL LOW (ref 12.0–15.0)
MCH: 32.8 pg (ref 26.0–34.0)
MCHC: 31.7 g/dL (ref 30.0–36.0)
MCV: 103.4 fL — ABNORMAL HIGH (ref 80.0–100.0)
Platelets: 168 10*3/uL (ref 150–400)
RBC: 3.51 MIL/uL — ABNORMAL LOW (ref 3.87–5.11)
RDW: 14.5 % (ref 11.5–15.5)
WBC: 7.1 10*3/uL (ref 4.0–10.5)
nRBC: 0 % (ref 0.0–0.2)

## 2022-06-04 LAB — BASIC METABOLIC PANEL
Anion gap: 7 (ref 5–15)
BUN: 16 mg/dL (ref 8–23)
CO2: 25 mmol/L (ref 22–32)
Calcium: 9.7 mg/dL (ref 8.9–10.3)
Chloride: 105 mmol/L (ref 98–111)
Creatinine, Ser: 0.89 mg/dL (ref 0.44–1.00)
GFR, Estimated: >60 mL/min (ref >60)
Glucose, Bld: 86 mg/dL (ref 70–99)
Potassium: 4.4 mmol/L (ref 3.5–5.1)
Sodium: 137 mmol/L (ref 135–145)

## 2022-06-04 LAB — TROPONIN I (HIGH SENSITIVITY): Troponin I (High Sensitivity): 9 ng/L (ref <18)

## 2022-06-04 LAB — BRAIN NATRIURETIC PEPTIDE: B Natriuretic Peptide: 712.1 pg/mL — ABNORMAL HIGH (ref 0.0–100.0)

## 2022-06-04 MED ORDER — SODIUM CHLORIDE 0.9 % IV BOLUS
500.0000 mL | Freq: Once | INTRAVENOUS | Status: AC
  Administered 2022-06-04: 500 mL via INTRAVENOUS

## 2022-06-04 NOTE — ED Notes (Signed)
   06/04/22 1843 06/04/22 1845 06/04/22 1846  Vitals  BP 106/69 106/78 117/74  MAP (mmHg) 81 88 87  Patient Position (if appropriate) Lying Sitting Standing  Pulse Rate 88 86 87  ECG Heart Rate 91 98 89  Resp (!) 26 (!) 28 (!) 27  MEWS COLOR  MEWS Score Color Yellow Yellow Yellow   Orthostatic vitals

## 2022-06-04 NOTE — Patient Instructions (Signed)
We recommend evaluation in the emergency room today.   Medication Instructions:  No changes today.   Lab Work: None ordered today.   Testing/Procedures: EKG today showed your pacemaker is working but also showed PVC's (early beats).   Your orthostatic vital signs today were positive.   Follow-Up: At [facility], you and your health needs are our priority.  As part of our continuing mission to provide you with exceptional heart care, we have created designated Provider Care Teams.  These Care Teams include your primary Cardiologist (physician) and Advanced Practice Providers (APPs -  Physician Assistants and Nurse Practitioners) who all work together to provide you with the care you need, when you need it.  We recommend signing up for the patient portal called "MyChart".  Sign up information is provided on this After Visit Summary.  MyChart is used to connect with patients for Virtual Visits (Telemedicine).  Patients are able to view lab/test results, encounter notes, upcoming appointments, etc.  Non-urgent messages can be sent to your provider as well.   To learn more about what you can do with MyChart, go to [email or web address].    Your next appointment:   3-4 weeks in person with  Carlyle Dolly, MD or one of the following Advanced Practice Providers on your designated Care Team:   Bernerd Pho, PA-C  Ermalinda Barrios, PA-C {  We can schedule this after you leave the hospital.

## 2022-06-04 NOTE — Discharge Instructions (Signed)
I recommend that you stop taking your lisinopril 2.5 mg medicine for blood pressure.  Continue all of your other normal medicines.  Your blood pressure is a little bit on the low side.  Please reach out to your heart doctor's office.

## 2022-06-04 NOTE — ED Provider Notes (Signed)
[facility] EMERGENCY DEPT Provider Note   CSN: [id number] Arrival date & time: 06/04/22  1656     History  Chief Complaint  Patient presents with   Shortness of Breath    Carol Hardin is a 88 y.o. female with history of sick sinus syndrome, chronic calorie malnutrition, chronic headaches, presenting to the ED with complaint of fatigue.  The patient was referred down to the emergency department after evaluation cardiology office today, which per my review of the cardiology notes, there was concern for dyspnea and hypotension.  The patient reports she has been intermittently diuresing with Lasix for lower extremity edema, also has not been drinking a lot of water.  She is concerned that she has felt worse since her neurology started on Depakote at the start of the month, and reports she took about 10 days of Depakote and then stopped it because she was worried about side effects.  She is having a difficult time describing the side effects to me.  This seems to involve headaches and fatigue  Per medical record she has a history of tachybradycardia syndrome and sick sinus syndrome and subsequently has CRT-P, history of mitral regurg, nonsustained V. tach, nonischemic cardiomyopathy. Echo in may 2023 with EF 25% -medical management is recommended the patient was not a candidate for defibrillator.  The patient is on Eliquis 2.5 mg for pacemaker  In the cardiology office today, they were concerned that her systolic pressures were low, ranging from the 80s to the 90s to 100.  Orthostatic vital signs were positive in the office today.  Patient has been having dizziness with walking.   HPI     Home Medications Prior to Admission medications   Medication Sig Start Date End Date Taking? Authorizing Provider  acetaminophen (TYLENOL) 325 MG tablet Take 325-975 mg by mouth daily.    [provider]  albuterol (VENTOLIN HFA) 108 (90 Base) MCG/ACT inhaler Inhale 2 puffs  into the lungs every 6 (six) hours as needed for wheezing or shortness of breath. 04/29/22   Byrum, Rose Fillers, MD  apixaban (ELIQUIS) 2.5 MG TABS tablet TAKE 1 TABLET(2.5 MG) BY MOUTH TWICE DAILY 04/05/22   O'Neal, Cassie Freer, MD  azelastine (ASTELIN) 0.1 % nasal spray Place 2 sprays into both nostrils 2 (two) times daily as needed for rhinitis. 05/28/22   Valentina Shaggy, MD  bismuth subsalicylate (PEPTO BISMOL) 262 MG/15ML suspension Take 30 mLs by mouth as needed.    [provider]  carvedilol (COREG) 3.125 MG tablet Take 1 tablet (3.125 mg total) by mouth 2 (two) times daily with a meal. 03/31/22   O'Neal, Cassie Freer, MD  divalproex (DEPAKOTE ER) 250 MG 24 hr tablet Take 1 tablet (250 mg total) by mouth daily. Patient not taking: Reported on 06/04/2022 04/30/22   Pieter Partridge, DO  Ensure (ENSURE) Take 237 mLs by mouth daily.    [provider]  fexofenadine (ALLEGRA) 60 MG tablet Take 1 tablet (60 mg total) by mouth daily. 05/18/21   Valentina Shaggy, MD  Fluocinolone Acetonide Scalp 0.01 % OIL Apply 1 Application topically 2 (two) times daily as needed. Patient not taking: Reported on 06/04/2022 05/23/22   Dara Hoyer, FNP  furosemide (LASIX) 20 MG tablet Take 20 mg by mouth as needed for fluid.    [provider]  lisinopril (ZESTRIL) 2.5 MG tablet TAKE 1 TABLET(2.5 MG) BY MOUTH DAILY 01/15/22   Allred, Jeneen Rinks, MD  LORazepam (ATIVAN) 1 MG tablet Take  1 mg by mouth at bedtime.    [provider]  Polyethyl Glycol-Propyl Glycol (SYSTANE OP) Apply to eye as needed.    [provider]  triamcinolone ointment (KENALOG) 0.1 % Apply 1 application topically 2 (two) times daily. 11/20/21   Valentina Shaggy, MD  Vibegron (GEMTESA) 75 MG TABS Take 75 mg by mouth daily.    [provider]      Allergies    Omeprazole, Flagyl [metronidazole], Amiodarone, Aripiprazole, Clindamycin/lincomycin, Gabapentin, Hylan g-f 20, Metoprolol tartrate,  Paroxetine, Paroxetine hcl, Statins, Sulfa antibiotics, Toprol xl [metoprolol succinate], Vancomycin, and Penicillins    Review of Systems   Review of Systems  Physical Exam Updated Vital Signs BP 104/71   Pulse 80   Temp 98 F (36.7 C) (Oral)   Resp (!) 21   Ht 5' 3.25" (1.607 m)   Wt 52.2 kg   SpO2 94%   BMI 20.22 kg/m  Physical Exam Constitutional:      General: She is not in acute distress. HENT:     Head: Normocephalic and atraumatic.  Eyes:     Conjunctiva/sclera: Conjunctivae normal.     Pupils: Pupils are equal, round, and reactive to light.  Cardiovascular:     Rate and Rhythm: Normal rate and regular rhythm.     Comments: Bigeminy Pulmonary:     Effort: Pulmonary effort is normal. No respiratory distress.  Abdominal:     General: There is no distension.     Tenderness: There is no abdominal tenderness.  Skin:    General: Skin is warm and dry.  Neurological:     General: No focal deficit present.     Mental Status: She is alert. Mental status is at baseline.  Psychiatric:        Mood and Affect: Mood normal.        Behavior: Behavior normal.     ED Results / Procedures / Treatments   Labs (all labs ordered are listed, but only abnormal results are displayed) Labs Reviewed  CBC - Abnormal; Notable for the following components:      Result Value   RBC 3.51 (*)    Hemoglobin 11.5 (*)    MCV 103.4 (*)    All other components within normal limits  BRAIN NATRIURETIC PEPTIDE - Abnormal; Notable for the following components:   B Natriuretic Peptide 712.1 (*)    All other components within normal limits  BASIC METABOLIC PANEL  TROPONIN I (HIGH SENSITIVITY)    EKG EKG Interpretation  Date/Time:  Friday June 04 2022 17:15:15 EDT Ventricular Rate:  85 PR Interval:    QRS Duration: 158 QT Interval:  430 QTC Calculation: 511 R Axis:   264 Text Interpretation: Wide QRS rhythm with frequent ventricular-paced complexes and Premature supraventricular  complexes in a pattern of bigeminy Right bundle branch block Possible Lateral infarct , age undetermined Abnormal ECG When compared with ECG of 16-Feb-2021 21:52, PREVIOUS ECG IS PRESENT Confirmed by Octaviano Glow [phone]) on 06/04/2022 6:07:12 PM  Radiology DG Chest 2 View  Result Date: 06/04/2022 CLINICAL DATA:  Worsening shortness of breath. EXAM: CHEST - 2 VIEW COMPARISON:  April 29, 2022 FINDINGS: A multi lead AICD is noted. The cardiac silhouette is markedly enlarged and unchanged in size. The lungs are hyperinflated with mild, diffuse, chronic appearing increased interstitial lung markings. Mild atelectasis is seen within the bilateral lung bases. There is no evidence of a pleural effusion or pneumothorax. The visualized skeletal structures are unremarkable. IMPRESSION: 1. Stable  cardiomegaly and COPD with mild bibasilar atelectasis. Electronically Signed   By: Virgina Norfolk M.D.   On: 06/04/2022 17:42    Procedures Procedures    Medications Ordered in ED Medications  sodium chloride 0.9 % bolus 500 mL (500 mLs Intravenous New Bag/Given 06/04/22 1908)    ED Course/ Medical Decision Making/ A&P Clinical Course as of 06/04/22 2047  Fri Jun 04, 2022  2036 Patient reassessed and vital signs and blood pressure been stable.  I advised that she stop her lisinopril which is 2.5 mg daily.  He is also on Coreg twice a day but uses this for her rhythm as well.  She is not on any other blood pressure medicines.  She verbalized understanding [MT]    Clinical Course User Index [MT] Jaiyanna Safran, Carola Rhine, MD                           Medical Decision Making Amount and/or Complexity of Data Reviewed Labs: ordered. Radiology: ordered.   This patient presents to the ED with concern for generalized fatigue, hypotension. This involves an extensive number of treatment options, and is a complaint that carries with it a high risk of complications and morbidity.  The differential diagnosis includes  static hypotension versus anemia versus arrhythmia versus pneumonia versus other  Co-morbidities that complicate the patient evaluation: History of nonischemic cardiomyopathy and valvular disease, blood thinner use, high risk for anemia and cardiac complications  Additional history obtained from patient's husband at bedside  External records from outside source obtained and reviewed including cardiology office evaluation note from today  I ordered and personally interpreted labs.  The pertinent results include: No cute anemia, no leukocytosis, BMP is unremarkable.  I ordered imaging studies including x-ray of the chest I independently visualized and interpreted imaging which showed cardiomegaly with no acute infarct I agree with the radiologist interpretation  The patient was maintained on a cardiac monitor.  I personally viewed and interpreted the cardiac monitored which showed an underlying rhythm of: Ventricular paced ventricular bigeminy  Per my interpretation the patient's ECG shows ventricular paced ventricular bigeminy  I ordered medication including fluid bolus for orthostatic hypotension  I have reviewed the patients home medicines and have made adjustments as needed -explained it is possible she experienced side effects of Depakote but she has discontinued this medication several days ago at this point.  Test Considered: Lower suspicion for acute PE clinically  After the interventions noted above, I reevaluated the patient and found that they have: stayed the same   Dispostion:  After consideration of the diagnostic results and the patients response to treatment, I feel that the patent would benefit from outpatient f/u.Marland Kitchen         Final Clinical Impression(s) / ED Diagnoses Final diagnoses:  Other fatigue  Hypotension, unspecified hypotension type    Rx / DC Orders ED Discharge Orders     None         Wyvonnia Dusky, MD 06/04/22 2047

## 2022-06-04 NOTE — ED Triage Notes (Signed)
Patient here POV from Home.  Endorses SOB Intermittently Increasing recently. History of Atrial Fib., CHF.   Uses 2LNC at Night for Sleep. No Known Fevers. Mildly Productive Cough. Sent by MD for Assessment.   NAD Noted during Triage. A&Ox4. GCS 15. BIB Wheelchair.

## 2022-06-04 NOTE — ED Notes (Signed)
RT note: Pt. ambulated with pulse oximeter while on room air with results as follows: Starting: Pulse-84 / Sat-97% Ending:  Pulse-83 / Sat%-96% Pt. stated once in bed was feeling slightly more winded than when starting, no noted physical signs of >'d WOB/RR during exertion. Tolerated well.

## 2022-06-05 ENCOUNTER — Telehealth (HOSPITAL_BASED_OUTPATIENT_CLINIC_OR_DEPARTMENT_OTHER): Payer: Self-pay | Admitting: Nurse Practitioner

## 2022-06-05 NOTE — Telephone Encounter (Signed)
S/W patient on the phone at 1:15 PM this afternoon.  She is doing much better since her ED visit yesterday.  She denies any complaints, problems, or any recurring dizziness.  Says she received some IV fluids yesterday and blood pressure went up to 935 systolic.  She states "I have not seen that good blood pressure in a long time, that is as normal as I will ever get."  States she could have been getting low blood pressures at home due to her blood pressure cuff device.  I mentioned that we checked her blood pressure in the office using a pediatric cuff, and I will work on trying to get this for her.  I told her once she has this accurate blood pressure device, she needs to check her readings for the next week or two and let me know her readings via MyChart.  I will also update Dr. Davina Poke regarding the fact that the ED doc stopped lisinopril yesterday, also considering midodrine 5 mg as needed if her blood pressure drops less than 90/60.  She denies any bleeding.  Plan to have her follow-up sooner rather than later, approximately 3 to 4 weeks with APP.  Went over CHF management, including salt restriction of less than 2 g/day, fluid restriction less than 2 L/day, and to weigh herself daily and let us know if she gains more than 5 pounds in 1 week or more than 3 pounds in note a day.  She verbalized understanding and was appreciative of the call.  [name], NP

## 2022-06-07 ENCOUNTER — Other Ambulatory Visit (HOSPITAL_BASED_OUTPATIENT_CLINIC_OR_DEPARTMENT_OTHER): Payer: Self-pay | Admitting: Nurse Practitioner

## 2022-06-07 ENCOUNTER — Telehealth: Payer: Self-pay | Admitting: Internal Medicine

## 2022-06-07 ENCOUNTER — Telehealth: Payer: Self-pay | Admitting: Cardiovascular Disease

## 2022-06-07 ENCOUNTER — Encounter (HOSPITAL_BASED_OUTPATIENT_CLINIC_OR_DEPARTMENT_OTHER): Payer: Self-pay

## 2022-06-07 NOTE — Telephone Encounter (Signed)
Pt calling stating that Carol Bud, NP was very thorough at their last visit, which she was very appreciative of. She states she would also like Dr. Audie Box to look over her notes from [facility] and ED to see if he had any additional recommendations.

## 2022-06-07 NOTE — Progress Notes (Signed)
Consulted Dr. Audie Box regarding patient's update and he agreed to stop Lisinopril 2.5 mg daily to improve patient's blood pressure. Patient has already been updated regarding this as I spoke to her on the phone on 06/05/22. Plan to follow up with her in the office on 06/21/22 and reevaluate her.   [name], NP

## 2022-06-07 NOTE — Telephone Encounter (Addendum)
Spoke with patient and she is very hard to pin down what her concern is.  She is curious why one provider would want her to go to the ED to be evaluated but then the ED sent her home.  Looks like she called and a message has been sent to Dr. Audie Box as well.  There is a phone note from today from [name], NP regarding the patient stopping Lisinopril of which she did not hear when she was told.  She will  follow up on 9/11 with Benjamine Mola at 10:55am.  Patient aware and appreciative of call.  She was reminded again to stop Lisinopril.

## 2022-06-07 NOTE — Telephone Encounter (Signed)
Routed to MD to review

## 2022-06-07 NOTE — Telephone Encounter (Signed)
Patient is calling stating the NP she saw on 08/25 found PVC's (early beats) on her EKG. She is wanting to confirm Dr. Lovena Le is aware of this and see if he has any recommendations regarding it due to him being her EP provider. Please advise.

## 2022-06-10 NOTE — Telephone Encounter (Signed)
Called patient, advised that Dr.O'Neal agreed with plan- however, she states her PCP advised her to move up her appointment with Dr.O'Neal (this is scheduled in December) she does have a visit with Coletta Memos, NP on 09/11- do you want to move this appointment with you sooner or keep what is scheduled?  Thanks!

## 2022-06-12 ENCOUNTER — Telehealth: Payer: Self-pay | Admitting: Home Health

## 2022-06-12 NOTE — Telephone Encounter (Signed)
Patient called after hour today reporting that her BP was low on 06/02/22 with SBP 80s. Her PCP asked her to see Dr Jenetta DownerNori Riis. She subsequently saw NP [name], was told to stop lisinopril for 2 days. Later she was told by Dr Audie Box nurse Almyra Free to stop lisinopril completely. She is poor historian. She states she stopped lisinopril and felt this was causing a lot issue. She is now feeling very SOB, felt like can't get enough air in, unable to sleep, felt her heart beat is unusal. She states her BP was better. She is wondering if she should resume lisinopril. Advised patient to go to nearest urgent care for in person evaluation given her multiple complaints, unable provide advice over the phone until she is being examined. She is agreeable.

## 2022-06-13 DIAGNOSIS — R0600 Dyspnea, unspecified: Secondary | ICD-10-CM | POA: Diagnosis not present

## 2022-06-13 DIAGNOSIS — I5020 Unspecified systolic (congestive) heart failure: Secondary | ICD-10-CM | POA: Diagnosis not present

## 2022-06-13 DIAGNOSIS — R0902 Hypoxemia: Secondary | ICD-10-CM | POA: Diagnosis not present

## 2022-06-13 DIAGNOSIS — R0602 Shortness of breath: Secondary | ICD-10-CM | POA: Diagnosis not present

## 2022-06-14 ENCOUNTER — Emergency Department (HOSPITAL_COMMUNITY): Payer: Medicare Other

## 2022-06-14 ENCOUNTER — Other Ambulatory Visit: Payer: Self-pay

## 2022-06-14 ENCOUNTER — Encounter (HOSPITAL_COMMUNITY): Payer: Self-pay | Admitting: *Deleted

## 2022-06-14 ENCOUNTER — Emergency Department (HOSPITAL_COMMUNITY)
Admission: EM | Admit: 2022-06-14 | Discharge: 2022-06-14 | Disposition: A | Discharge status: Home / Self Care | Payer: Medicare Other | Attending: Emergency Medicine | Admitting: Emergency Medicine

## 2022-06-14 DIAGNOSIS — I959 Hypotension, unspecified: Secondary | ICD-10-CM | POA: Diagnosis not present

## 2022-06-14 DIAGNOSIS — R7401 Elevation of levels of liver transaminase levels: Secondary | ICD-10-CM | POA: Diagnosis not present

## 2022-06-14 DIAGNOSIS — Y92007 Garden or yard of unspecified non-institutional (private) residence as the place of occurrence of the external cause: Secondary | ICD-10-CM | POA: Insufficient documentation

## 2022-06-14 DIAGNOSIS — R519 Headache, unspecified: Secondary | ICD-10-CM | POA: Diagnosis not present

## 2022-06-14 DIAGNOSIS — S7002XA Contusion of left hip, initial encounter: Secondary | ICD-10-CM | POA: Insufficient documentation

## 2022-06-14 DIAGNOSIS — Z7901 Long term (current) use of anticoagulants: Secondary | ICD-10-CM | POA: Insufficient documentation

## 2022-06-14 DIAGNOSIS — R600 Localized edema: Secondary | ICD-10-CM | POA: Diagnosis not present

## 2022-06-14 DIAGNOSIS — W19XXXA Unspecified fall, initial encounter: Secondary | ICD-10-CM | POA: Diagnosis not present

## 2022-06-14 DIAGNOSIS — S0990XA Unspecified injury of head, initial encounter: Secondary | ICD-10-CM | POA: Diagnosis not present

## 2022-06-14 DIAGNOSIS — M47812 Spondylosis without myelopathy or radiculopathy, cervical region: Secondary | ICD-10-CM | POA: Diagnosis not present

## 2022-06-14 DIAGNOSIS — R9431 Abnormal electrocardiogram [ECG] [EKG]: Secondary | ICD-10-CM | POA: Diagnosis not present

## 2022-06-14 DIAGNOSIS — S3210XA Unspecified fracture of sacrum, initial encounter for closed fracture: Secondary | ICD-10-CM | POA: Diagnosis not present

## 2022-06-14 DIAGNOSIS — W1830XA Fall on same level, unspecified, initial encounter: Secondary | ICD-10-CM | POA: Insufficient documentation

## 2022-06-14 DIAGNOSIS — R52 Pain, unspecified: Secondary | ICD-10-CM | POA: Diagnosis not present

## 2022-06-14 DIAGNOSIS — S34139A Unspecified injury to sacral spinal cord, initial encounter: Secondary | ICD-10-CM | POA: Diagnosis present

## 2022-06-14 DIAGNOSIS — R0902 Hypoxemia: Secondary | ICD-10-CM | POA: Diagnosis not present

## 2022-06-14 DIAGNOSIS — S79912A Unspecified injury of left hip, initial encounter: Secondary | ICD-10-CM | POA: Diagnosis not present

## 2022-06-14 LAB — CBC WITH DIFFERENTIAL/PLATELET
Abs Immature Granulocytes: 0.05 10*3/uL (ref 0.00–0.07)
Basophils Absolute: 0.1 10*3/uL (ref 0.0–0.1)
Basophils Relative: 1 %
Eosinophils Absolute: 0.2 10*3/uL (ref 0.0–0.5)
Eosinophils Relative: 4 %
HCT: 32.6 % — ABNORMAL LOW (ref 36.0–46.0)
Hemoglobin: 10.4 g/dL — ABNORMAL LOW (ref 12.0–15.0)
Immature Granulocytes: 1 %
Lymphocytes Relative: 22 %
Lymphs Abs: 1.2 10*3/uL (ref 0.7–4.0)
MCH: 33.5 pg (ref 26.0–34.0)
MCHC: 31.9 g/dL (ref 30.0–36.0)
MCV: 105.2 fL — ABNORMAL HIGH (ref 80.0–100.0)
Monocytes Absolute: 0.6 10*3/uL (ref 0.1–1.0)
Monocytes Relative: 11 %
Neutro Abs: 3.2 10*3/uL (ref 1.7–7.7)
Neutrophils Relative %: 61 %
Platelets: 162 10*3/uL (ref 150–400)
RBC: 3.1 MIL/uL — ABNORMAL LOW (ref 3.87–5.11)
RDW: 14.9 % (ref 11.5–15.5)
WBC: 5.3 10*3/uL (ref 4.0–10.5)
nRBC: 0 % (ref 0.0–0.2)

## 2022-06-14 LAB — COMPREHENSIVE METABOLIC PANEL
ALT: 39 U/L (ref 0–44)
AST: 53 U/L — ABNORMAL HIGH (ref 15–41)
Albumin: 3.3 g/dL — ABNORMAL LOW (ref 3.5–5.0)
Alkaline Phosphatase: 51 U/L (ref 38–126)
Anion gap: 5 (ref 5–15)
BUN: 19 mg/dL (ref 8–23)
CO2: 26 mmol/L (ref 22–32)
Calcium: 8.7 mg/dL — ABNORMAL LOW (ref 8.9–10.3)
Chloride: 107 mmol/L (ref 98–111)
Creatinine, Ser: 1 mg/dL (ref 0.44–1.00)
GFR, Estimated: 55 mL/min — ABNORMAL LOW (ref >60)
Glucose, Bld: 112 mg/dL — ABNORMAL HIGH (ref 70–99)
Potassium: 3.9 mmol/L (ref 3.5–5.1)
Sodium: 138 mmol/L (ref 135–145)
Total Bilirubin: 1.3 mg/dL — ABNORMAL HIGH (ref 0.3–1.2)
Total Protein: 8 g/dL (ref 6.5–8.1)

## 2022-06-14 NOTE — Discharge Instructions (Addendum)
You were seen in the emergency department for evaluation of injuries from a fall.  You had a CAT scan of your head and neck that did not show any fracture or bleed.  You had a CAT scan of your left hip that showed a sacral fracture but no hip fracture.  Please use ice to the affected area and Tylenol for pain.  Weightbearing as tolerated using walker.  Follow-up with your primary care doctor and your orthopedic doctor.  Return to the emergency department if any worsening or concerning symptoms.

## 2022-06-14 NOTE — ED Provider Notes (Signed)
[facility] EMERGENCY DEPARTMENT Provider Note   CSN: [id number] Arrival date & time: 06/14/22  1815     History  Chief Complaint  Patient presents with   Carol Hardin    Carol Hardin is a 88 y.o. female.  She is here for what sounds like a mechanical fall.  She said she was walking in the driveway when she fell landing on her left hip and striking her head.  No loss of consciousness.  She has been unable to ambulate since then.  Complaining of minimal pain of her head and significant pain in her left hip.  She denies other injuries or complaints.  She said she is short of breath but is at baseline and she usually uses oxygen follows with Dr. Malvin Johns from pulmonary.  She is on blood thinners for her A-fib and has a pacemaker.  She does not usually use a cane or a walker  The history is provided by the patient and the spouse.  Fall This is a new problem. The problem has not changed since onset.Associated symptoms include headaches and shortness of breath. Pertinent negatives include no chest pain and no abdominal pain. Associated symptoms comments: Left hip pain. The symptoms are aggravated by bending and twisting. Nothing relieves the symptoms. She has tried nothing for the symptoms. The treatment provided no relief.       Home Medications Prior to Admission medications   Medication Sig Start Date End Date Taking? Authorizing Provider  acetaminophen (TYLENOL) 325 MG tablet Take 325-975 mg by mouth daily.    [provider]  albuterol (VENTOLIN HFA) 108 (90 Base) MCG/ACT inhaler Inhale 2 puffs into the lungs every 6 (six) hours as needed for wheezing or shortness of breath. 04/29/22   Byrum, Rose Fillers, MD  apixaban (ELIQUIS) 2.5 MG TABS tablet TAKE 1 TABLET(2.5 MG) BY MOUTH TWICE DAILY 04/05/22   O'Neal, Cassie Freer, MD  azelastine (ASTELIN) 0.1 % nasal spray Place 2 sprays into both nostrils 2 (two) times daily as needed for rhinitis. 05/28/22   Valentina Shaggy, MD  bismuth  subsalicylate (PEPTO BISMOL) 262 MG/15ML suspension Take 30 mLs by mouth as needed.    [provider]  carvedilol (COREG) 3.125 MG tablet Take 1 tablet (3.125 mg total) by mouth 2 (two) times daily with a meal. 03/31/22   O'Neal, Cassie Freer, MD  divalproex (DEPAKOTE ER) 250 MG 24 hr tablet Take 1 tablet (250 mg total) by mouth daily. Patient not taking: Reported on 06/04/2022 04/30/22   Pieter Partridge, DO  Ensure (ENSURE) Take 237 mLs by mouth daily.    [provider]  fexofenadine (ALLEGRA) 60 MG tablet Take 1 tablet (60 mg total) by mouth daily. 05/18/21   Valentina Shaggy, MD  Fluocinolone Acetonide Scalp 0.01 % OIL Apply 1 Application topically 2 (two) times daily as needed. Patient not taking: Reported on 06/04/2022 05/23/22   Dara Hoyer, FNP  furosemide (LASIX) 20 MG tablet Take 20 mg by mouth as needed for fluid.    [provider]  LORazepam (ATIVAN) 1 MG tablet Take 1 mg by mouth at bedtime.    [provider]  Polyethyl Glycol-Propyl Glycol (SYSTANE OP) Apply to eye as needed.    [provider]  triamcinolone ointment (KENALOG) 0.1 % Apply 1 application topically 2 (two) times daily. 11/20/21   Valentina Shaggy, MD  Vibegron (GEMTESA) 75 MG TABS Take 75 mg by mouth daily.    [provider]  Allergies    Omeprazole, Flagyl [metronidazole], Amiodarone, Aripiprazole, Clindamycin/lincomycin, Gabapentin, Hylan g-f 20, Metoprolol tartrate, Paroxetine, Paroxetine hcl, Statins, Sulfa antibiotics, Toprol xl [metoprolol succinate], Vancomycin, and Penicillins    Review of Systems   Review of Systems  Constitutional:  Negative for fever.  HENT:  Negative for sore throat.   Eyes:  Negative for visual disturbance.  Respiratory:  Positive for shortness of breath.   Cardiovascular:  Negative for chest pain.  Gastrointestinal:  Negative for abdominal pain.  Genitourinary:  Negative for dysuria.  Musculoskeletal:  Positive for  gait problem.  Skin:  Negative for rash.  Neurological:  Positive for headaches.    Physical Exam Updated Vital Signs BP 119/64   Pulse 87   Temp 98.4 F (36.9 C) (Oral)   Resp (!) 27   Ht '5\' 3"'$  (1.6 m)   Wt 53.5 kg   SpO2 94%   BMI 20.90 kg/m  Physical Exam Vitals and nursing note reviewed.  Constitutional:      General: She is not in acute distress.    Appearance: Normal appearance. She is well-developed.  HENT:     Head: Normocephalic and atraumatic.  Eyes:     Conjunctiva/sclera: Conjunctivae normal.  Cardiovascular:     Rate and Rhythm: Normal rate. Rhythm irregular.     Heart sounds: No murmur heard. Pulmonary:     Effort: Pulmonary effort is normal. No respiratory distress.     Breath sounds: Normal breath sounds.  Abdominal:     Palpations: Abdomen is soft.     Tenderness: There is no abdominal tenderness. There is no guarding or rebound.  Musculoskeletal:        General: No deformity.     Cervical back: Neck supple.     Right lower leg: Edema present.     Left lower leg: Edema present.     Comments: She has some tenderness of her left hip and proximal femur.  Skin:    General: Skin is warm and dry.     Capillary Refill: Capillary refill takes less than 2 seconds.  Neurological:     General: No focal deficit present.     Mental Status: She is alert.     Sensory: No sensory deficit.     Motor: No weakness.     ED Results / Procedures / Treatments   Labs (all labs ordered are listed, but only abnormal results are displayed) Labs Reviewed  CBC WITH DIFFERENTIAL/PLATELET - Abnormal; Notable for the following components:      Result Value   RBC 3.10 (*)    Hemoglobin 10.4 (*)    HCT 32.6 (*)    MCV 105.2 (*)    All other components within normal limits  COMPREHENSIVE METABOLIC PANEL - Abnormal; Notable for the following components:   Glucose, Bld 112 (*)    Calcium 8.7 (*)    Albumin 3.3 (*)    AST 53 (*)    Total Bilirubin 1.3 (*)    GFR,  Estimated 55 (*)    All other components within normal limits    EKG EKG Interpretation  Date/Time:  Monday June 14 2022 18:23:50 EDT Ventricular Rate:  95 PR Interval:    QRS Duration: 157 QT Interval:  438 QTC Calculation: 456 R Axis:   -84 Text Interpretation: paced rhythm Paired ventricular premature complexes No significant change since prior 8/23 Confirmed by Aletta Edouard [phone]) on 06/14/2022 6:54:19 PM  Radiology CT Hip Left Wo Contrast  Result Date: 06/14/2022 CLINICAL  DATA:  Hip trauma, fracture suspected, xray done EXAM: CT OF THE LEFT HIP WITHOUT CONTRAST TECHNIQUE: Multidetector CT imaging of the left hip was performed according to the standard protocol. Multiplanar CT image reconstructions were also generated. RADIATION DOSE REDUCTION: This exam was performed according to the departmental dose-optimization program which includes automated exposure control, adjustment of the mA and/or kV according to patient size and/or use of iterative reconstruction technique. COMPARISON:  X-ray left hip and pelvis 06/14/2022, CT abdomen pelvis 03/31/2021 FINDINGS: Bones/Joint/Cartilage Proximal left femur intramedullary nail fixation. No CT findings suggest surgical hardware complication. No evidence of fracture or dislocation of the left hip. Partially visualized acute nondisplaced fracture of the left sacral ala (6:46, 3:9). No evidence of severe arthropathy. No aggressive appearing focal bone abnormality. Ligaments Suboptimally assessed by CT. Muscles and Tendons Grossly unremarkable Soft tissues Mild subcutaneus soft tissue edema of the left gluteal soft tissues. Other: Visualized pelvis demonstrates colonic diverticulosis. Likely degenerative uterine fibroids. Atherosclerotic plaque. Persistent small volume ascites. Otherwise grossly unremarkable. IMPRESSION: 1. Partially visualized acute nondisplaced fracture of the left sacral ala. 2.  Aortic Atherosclerosis (ICD10-I70.0).  Electronically Signed   By: Iven Finn M.D.   On: 06/14/2022 21:40   CT Cervical Spine Wo Contrast  Result Date: 06/14/2022 CLINICAL DATA:  Trauma, fall EXAM: CT CERVICAL SPINE WITHOUT CONTRAST TECHNIQUE: Multidetector CT imaging of the cervical spine was performed without intravenous contrast. Multiplanar CT image reconstructions were also generated. RADIATION DOSE REDUCTION: This exam was performed according to the departmental dose-optimization program which includes automated exposure control, adjustment of the mA and/or kV according to patient size and/or use of iterative reconstruction technique. COMPARISON:  12/20/2020 FINDINGS: Alignment: Alignment of posterior margins of vertebral bodies is unremarkable. Skull base and vertebrae: No recent fracture is seen. Degenerative changes are noted, more so from C4-C7 levels. Soft tissues and spinal canal: There is no central spinal stenosis. Disc levels: There is encroachment of neural foramina from C3-C7 levels. Upper chest: Centrilobular emphysema is seen. Other: There is inhomogeneous attenuation in thyroid. IMPRESSION: No recent fracture is seen in cervical spine. Cervical spondylosis with encroachment of neural foramina from C3-C7 levels. No significant interval changes are noted. Electronically Signed   By: Elmer Picker M.D.   On: 06/14/2022 19:37   CT Head Wo Contrast  Result Date: 06/14/2022 CLINICAL DATA:  Trauma, fall EXAM: CT HEAD WITHOUT CONTRAST TECHNIQUE: Contiguous axial images were obtained from the base of the skull through the vertex without intravenous contrast. RADIATION DOSE REDUCTION: This exam was performed according to the departmental dose-optimization program which includes automated exposure control, adjustment of the mA and/or kV according to patient size and/or use of iterative reconstruction technique. COMPARISON:  02/16/2021 FINDINGS: Brain: No acute intracranial findings are seen. There are no signs of bleeding within  the cranium. Cortical sulci are prominent. There is no focal edema or mass effect. Vascular: Unremarkable. Skull: No fracture is seen in calvarium. Sinuses/Orbits: Unremarkable. Other: None. IMPRESSION: No acute intracranial findings are seen in noncontrast CT brain. Atrophy. Electronically Signed   By: Elmer Picker M.D.   On: 06/14/2022 19:32   DG Hip Unilat W or Wo Pelvis 2-3 Views Left  Result Date: 06/14/2022 CLINICAL DATA:  Trauma, fall EXAM: DG HIP (WITH OR WITHOUT PELVIS) 2-3V LEFT COMPARISON:  12/20/2020 FINDINGS: No recent fracture or dislocation is seen. There is previous internal fixation with intramedullary rod in proximal left femur. Smooth marginated calcification is noted along the inner margin of the neck of the left  femur. These findings appear stable. IMPRESSION: No recent fracture or dislocation is seen pelvis and left hip. Previous internal fixation in left femur. Electronically Signed   By: Elmer Picker M.D.   On: 06/14/2022 19:30    Procedures Procedures    Medications Ordered in ED Medications - No data to display  ED Course/ Medical Decision Making/ A&P Clinical Course as of 06/15/22 1056  Mon Jun 14, 2022  2158 CT is not showing any head or neck acute trauma.  CT hip showing sacral ala fracture.  No hip fracture.  Reviewed with patient and husband.  We will ambulate in the department to see if safe for discharge. [MB]  2211 Patient was able to walk without a walker for about 5 steps but with a walker she was much better and stable on her feet. [MB]    Clinical Course User Index [MB] Hayden Rasmussen, MD                           Medical Decision Making Amount and/or Complexity of Data Reviewed Labs: ordered. Radiology: ordered.  This patient complains of fall head pain left hip pain; this involves an extensive number of treatment Options and is a complaint that carries with it a high risk of complications and morbidity. The differential includes  fracture, contusion, dislocation  I ordered, reviewed and interpreted labs, which included CBC with normal white count hemoglobin slightly lower than priors, chemistries fairly normal, mild elevation of AST and T. bili, urinalysis ordered not obtained I ordered imaging studies which included CT of head and cervical spine, x-rays hip, CT left hip and I independently    visualized and interpreted imaging which showed sacral ala fracture Additional history obtained from patient's husband Previous records obtained and reviewed in epic no recent admissions  Cardiac monitoring reviewed, normal sinus rhythm Social determinants considered, patient with poor physical activity Critical Interventions: None  After the interventions stated above, I reevaluated the patient and found patient's pain to be controlled and ambulatory in the department Admission and further testing considered, no indications for admission or further work-up at this time.  Will discharge with walker and recommended close follow-up with PCP and orthopedic team.  Return instructions discussed.          Final Clinical Impression(s) / ED Diagnoses Final diagnoses:  Fall, initial encounter  Contusion of left hip, initial encounter  Closed fracture of sacrum, unspecified fracture morphology, initial encounter [facility])    Rx / [place] Orders ED Discharge Orders     None         Hayden Rasmussen, MD 06/15/22 1059

## 2022-06-14 NOTE — ED Notes (Signed)
Patient ambulated down the hall in the emergency department. Patient was able to ambulate unassisted for approximately 5 steps before losing her balance. Patient was given a walker. Patient ambulated down the hall with assistance of the walker with ease.

## 2022-06-14 NOTE — ED Triage Notes (Signed)
BIB [facility] from home s/p fall, fell in yard walking up slight incline, fell on hard grass, c/o L hip and side pain. Has L leg rod from 2019 at [facility]. Denies syncope, dizziness, NV, or recent illness. Describes as mechanical fall. Mentions hitting head"but not hard". Takes Eliquis for afib. Wears O2 at night. Husband helped her into house. BS 166. No DM. Alert, NAD, calm, interactive. Speech clear. 87-88% on RA, placed on [place] 2L. No pain when still. No shortening or rotation. CMS intact. Pedal edema present.

## 2022-06-15 NOTE — Telephone Encounter (Signed)
Called patient, LVM advising to keep upcoming appointment with Denyse Amass, NP on 09/11.  Left call back number if questions/concerns.

## 2022-06-16 ENCOUNTER — Telehealth: Payer: Self-pay | Admitting: Cardiovascular Disease

## 2022-06-16 ENCOUNTER — Other Ambulatory Visit: Payer: Self-pay

## 2022-06-16 ENCOUNTER — Encounter (HOSPITAL_COMMUNITY): Payer: Self-pay

## 2022-06-16 ENCOUNTER — Emergency Department (HOSPITAL_COMMUNITY)
Admission: EM | Admit: 2022-06-16 | Discharge: 2022-06-16 | Disposition: A | Discharge status: Home / Self Care | Payer: Medicare Other | Attending: Emergency Medicine | Admitting: Emergency Medicine

## 2022-06-16 DIAGNOSIS — Z7901 Long term (current) use of anticoagulants: Secondary | ICD-10-CM | POA: Diagnosis not present

## 2022-06-16 DIAGNOSIS — S3210XD Unspecified fracture of sacrum, subsequent encounter for fracture with routine healing: Secondary | ICD-10-CM

## 2022-06-16 DIAGNOSIS — W19XXXD Unspecified fall, subsequent encounter: Secondary | ICD-10-CM | POA: Diagnosis not present

## 2022-06-16 DIAGNOSIS — R0902 Hypoxemia: Secondary | ICD-10-CM | POA: Diagnosis not present

## 2022-06-16 DIAGNOSIS — I959 Hypotension, unspecified: Secondary | ICD-10-CM | POA: Diagnosis not present

## 2022-06-16 MED ORDER — HYDROCODONE-ACETAMINOPHEN 5-325 MG PO TABS
1.0000 | ORAL_TABLET | Freq: Once | ORAL | Status: AC
  Administered 2022-06-16: 1 via ORAL
  Filled 2022-06-16: qty 1

## 2022-06-16 MED ORDER — HYDROCODONE-ACETAMINOPHEN 5-325 MG PO TABS: 1.0000 | ORAL_TABLET | ORAL | 0 refills | Status: DC | PRN

## 2022-06-16 NOTE — ED Triage Notes (Signed)
Pt arrived [facility] for lower back pain and does not have pain meds. Pt fell on 06/14/22.Pt takes eliquis

## 2022-06-16 NOTE — Discharge Instructions (Addendum)
Your exam today was overall reassuring.  You received a dose of medication for pain in the emergency room with improvement in your pain.  You are able to ambulate without much difficulty.  I have sent additional pain medication into the pharmacy for you.  If you have any worsening or concerning symptoms return to the emergency room.  Otherwise follow-up with your primary care provider.

## 2022-06-16 NOTE — Telephone Encounter (Signed)
I did attempt to contact husband to see how I could help. LVM to call back to discuss.   Thanks!

## 2022-06-16 NOTE — Telephone Encounter (Signed)
New Message:       Patient's husband called. He said he wanted the nurse to call [facility] ER and tell them about the patient's feet swelling. She is in the ER at this time for something else.new. I told the patient I was not sure the nurse could do this, but I would f

## 2022-06-16 NOTE — ED Provider Notes (Signed)
[facility] EMERGENCY DEPARTMENT Provider Note   CSN: [id number] Arrival date & time: 06/16/22  1344     History  Chief Complaint  Patient presents with   Back Pain    Carol Hardin is a 88 y.o. female.  88 year old female with past medical history significant for A-fib on Eliquis, history of CVA presents today for evaluation of low back pain.  Patient recently had a fall and evaluated in the emergency room on 9/4.  She was diagnosed with sacral alla fracture.  This was nondisplaced.  Patient states the day of the fall and yesterday she did fairly well and pain was controlled with Tylenol however pain worsened today.  She was able to ambulate from bedroom to the kitchen however with moderate amount of pain.  Tylenol has not helped much today.  Denies any new complaints since she was discharged 9/4.  The history is provided by the patient and medical records. No language interpreter was used.       Home Medications Prior to Admission medications   Medication Sig Start Date End Date Taking? Authorizing Provider  acetaminophen (TYLENOL) 325 MG tablet Take 325-975 mg by mouth daily.    [provider]  albuterol (VENTOLIN HFA) 108 (90 Base) MCG/ACT inhaler Inhale 2 puffs into the lungs every 6 (six) hours as needed for wheezing or shortness of breath. 04/29/22   Byrum, Rose Fillers, MD  apixaban (ELIQUIS) 2.5 MG TABS tablet TAKE 1 TABLET(2.5 MG) BY MOUTH TWICE DAILY 04/05/22   O'Neal, Cassie Freer, MD  azelastine (ASTELIN) 0.1 % nasal spray Place 2 sprays into both nostrils 2 (two) times daily as needed for rhinitis. 05/28/22   Valentina Shaggy, MD  bismuth subsalicylate (PEPTO BISMOL) 262 MG/15ML suspension Take 30 mLs by mouth as needed.    [provider]  carvedilol (COREG) 3.125 MG tablet Take 1 tablet (3.125 mg total) by mouth 2 (two) times daily with a meal. 03/31/22   O'Neal, Cassie Freer, MD  divalproex (DEPAKOTE ER) 250 MG 24 hr tablet Take 1 tablet (250 mg  total) by mouth daily. Patient not taking: Reported on 06/04/2022 04/30/22   Pieter Partridge, DO  Ensure (ENSURE) Take 237 mLs by mouth daily.    [provider]  fexofenadine (ALLEGRA) 60 MG tablet Take 1 tablet (60 mg total) by mouth daily. 05/18/21   Valentina Shaggy, MD  Fluocinolone Acetonide Scalp 0.01 % OIL Apply 1 Application topically 2 (two) times daily as needed. Patient not taking: Reported on 06/04/2022 05/23/22   Dara Hoyer, FNP  furosemide (LASIX) 20 MG tablet Take 20 mg by mouth as needed for fluid.    [provider]  LORazepam (ATIVAN) 1 MG tablet Take 1 mg by mouth at bedtime.    [provider]  Polyethyl Glycol-Propyl Glycol (SYSTANE OP) Apply to eye as needed.    [provider]  triamcinolone ointment (KENALOG) 0.1 % Apply 1 application topically 2 (two) times daily. 11/20/21   Valentina Shaggy, MD  Vibegron (GEMTESA) 75 MG TABS Take 75 mg by mouth daily.    [provider]      Allergies    Omeprazole, Flagyl [metronidazole], Amiodarone, Aripiprazole, Clindamycin/lincomycin, Gabapentin, Hylan g-f 20, Metoprolol tartrate, Paroxetine, Paroxetine hcl, Statins, Sulfa antibiotics, Toprol xl [metoprolol succinate], Vancomycin, and Penicillins    Review of Systems   Review of Systems  Respiratory:  Negative for shortness of breath.   Cardiovascular:  Negative for chest pain.  Musculoskeletal:  Positive for back pain and myalgias. Negative for neck pain and neck stiffness.  Neurological:  Negative for weakness and light-headedness.  All other systems reviewed and are negative.   Physical Exam Updated Vital Signs BP 97/66   Pulse 90   Temp 98.5 F (36.9 C) (Oral)   Resp 18   Ht '5\' 3"'$  (1.6 m)   Wt 54.2 kg   SpO2 94%   BMI 21.15 kg/m  Physical Exam Vitals and nursing note reviewed.  Constitutional:      General: She is not in acute distress.    Appearance: Normal appearance. She is not ill-appearing.  HENT:      Head: Normocephalic and atraumatic.     Nose: Nose normal.  Eyes:     General: No scleral icterus.    Extraocular Movements: Extraocular movements intact.     Conjunctiva/sclera: Conjunctivae normal.  Cardiovascular:     Rate and Rhythm: Normal rate. Rhythm irregular.     Pulses: Normal pulses.  Pulmonary:     Effort: Pulmonary effort is normal. No respiratory distress.     Breath sounds: Normal breath sounds. No wheezing or rales.  Abdominal:     General: There is no distension.     Palpations: Abdomen is soft.     Tenderness: There is no abdominal tenderness. There is no right CVA tenderness, left CVA tenderness or guarding.  Musculoskeletal:        General: Normal range of motion.     Cervical back: Normal range of motion.     Comments: Cervical and thoracic spine without tenderness to palpation.  Low lumbar spinal process tenderness present.  Lumbar paraspinal muscles without tenderness to palpation.  Good range of motion with hip flexion bilaterally, bilateral knee flexion and extension, plantar and dorsiflexion with 5/5 strength.  2+ DP pulse present bilaterally.  Sensation intact and symmetrical.  Skin:    General: Skin is warm and dry.  Neurological:     General: No focal deficit present.     Mental Status: She is alert. Mental status is at baseline.     ED Results / Procedures / Treatments   Labs (all labs ordered are listed, but only abnormal results are displayed) Labs Reviewed - No data to display  EKG None  Radiology CT Hip Left Wo Contrast  Result Date: 06/14/2022 CLINICAL DATA:  Hip trauma, fracture suspected, xray done EXAM: CT OF THE LEFT HIP WITHOUT CONTRAST TECHNIQUE: Multidetector CT imaging of the left hip was performed according to the standard protocol. Multiplanar CT image reconstructions were also generated. RADIATION DOSE REDUCTION: This exam was performed according to the departmental dose-optimization program which includes automated exposure control,  adjustment of the mA and/or kV according to patient size and/or use of iterative reconstruction technique. COMPARISON:  X-ray left hip and pelvis 06/14/2022, CT abdomen pelvis 03/31/2021 FINDINGS: Bones/Joint/Cartilage Proximal left femur intramedullary nail fixation. No CT findings suggest surgical hardware complication. No evidence of fracture or dislocation of the left hip. Partially visualized acute nondisplaced fracture of the left sacral ala (6:46, 3:9). No evidence of severe arthropathy. No aggressive appearing focal bone abnormality. Ligaments Suboptimally assessed by CT. Muscles and Tendons Grossly unremarkable Soft tissues Mild subcutaneus soft tissue edema of the left gluteal soft tissues. Other: Visualized pelvis demonstrates colonic diverticulosis. Likely degenerative uterine fibroids. Atherosclerotic plaque. Persistent small volume ascites. Otherwise grossly unremarkable. IMPRESSION: 1. Partially visualized acute nondisplaced fracture of the left sacral ala. 2.  Aortic Atherosclerosis (ICD10-I70.0). Electronically Signed   By: Thomasena Edis  Mckinley Jewel M.D.   On: 06/14/2022 21:40   CT Cervical Spine Wo Contrast  Result Date: 06/14/2022 CLINICAL DATA:  Trauma, fall EXAM: CT CERVICAL SPINE WITHOUT CONTRAST TECHNIQUE: Multidetector CT imaging of the cervical spine was performed without intravenous contrast. Multiplanar CT image reconstructions were also generated. RADIATION DOSE REDUCTION: This exam was performed according to the departmental dose-optimization program which includes automated exposure control, adjustment of the mA and/or kV according to patient size and/or use of iterative reconstruction technique. COMPARISON:  12/20/2020 FINDINGS: Alignment: Alignment of posterior margins of vertebral bodies is unremarkable. Skull base and vertebrae: No recent fracture is seen. Degenerative changes are noted, more so from C4-C7 levels. Soft tissues and spinal canal: There is no central spinal stenosis. Disc  levels: There is encroachment of neural foramina from C3-C7 levels. Upper chest: Centrilobular emphysema is seen. Other: There is inhomogeneous attenuation in thyroid. IMPRESSION: No recent fracture is seen in cervical spine. Cervical spondylosis with encroachment of neural foramina from C3-C7 levels. No significant interval changes are noted. Electronically Signed   By: Elmer Picker M.D.   On: 06/14/2022 19:37   CT Head Wo Contrast  Result Date: 06/14/2022 CLINICAL DATA:  Trauma, fall EXAM: CT HEAD WITHOUT CONTRAST TECHNIQUE: Contiguous axial images were obtained from the base of the skull through the vertex without intravenous contrast. RADIATION DOSE REDUCTION: This exam was performed according to the departmental dose-optimization program which includes automated exposure control, adjustment of the mA and/or kV according to patient size and/or use of iterative reconstruction technique. COMPARISON:  02/16/2021 FINDINGS: Brain: No acute intracranial findings are seen. There are no signs of bleeding within the cranium. Cortical sulci are prominent. There is no focal edema or mass effect. Vascular: Unremarkable. Skull: No fracture is seen in calvarium. Sinuses/Orbits: Unremarkable. Other: None. IMPRESSION: No acute intracranial findings are seen in noncontrast CT brain. Atrophy. Electronically Signed   By: Elmer Picker M.D.   On: 06/14/2022 19:32   DG Hip Unilat W or Wo Pelvis 2-3 Views Left  Result Date: 06/14/2022 CLINICAL DATA:  Trauma, fall EXAM: DG HIP (WITH OR WITHOUT PELVIS) 2-3V LEFT COMPARISON:  12/20/2020 FINDINGS: No recent fracture or dislocation is seen. There is previous internal fixation with intramedullary rod in proximal left femur. Smooth marginated calcification is noted along the inner margin of the neck of the left femur. These findings appear stable. IMPRESSION: No recent fracture or dislocation is seen pelvis and left hip. Previous internal fixation in left femur.  Electronically Signed   By: Elmer Picker M.D.   On: 06/14/2022 19:30    Procedures Procedures    Medications Ordered in ED Medications  HYDROcodone-acetaminophen (NORCO/VICODIN) 5-325 MG per tablet 1 tablet (1 tablet Oral Given 06/16/22 1445)    ED Course/ Medical Decision Making/ A&P                           Medical Decision Making Risk Prescription drug management.   Medical Decision Making / ED Course   This patient presents to the ED for concern of back pain, this involves an extensive number of treatment options, and is a complaint that carries with it a high risk of complications and morbidity.  The differential diagnosis includes muscle strain, acute fracture, ongoing pain from recent fracture.  MDM: 88 year old female with past medical history significant for A-fib on Eliquis, history of CVA presents today for evaluation of low back pain.  Patient was recently evaluated on 9/4 and found  to have sacral nondisplaced fracture.  Reports today her pain worsened.  Up until today she was taken Tylenol with good relief.  She was able to ambulate this morning but with pain.  No other new symptoms since discharge.  Patient overall is chronically ill-appearing but without acute distress.  Will provide dose of pain medication, and ambulate patient.  No indication for additional imaging at this time. Patient on reevaluation reports significant improvement in pain.  Has been able to ambulate without difficulty using the walker.  Patient is appropriate for discharge.  Discharged in stable condition.  Patient was discussed with my attending Dr. Doren Custard.  Of note when patient was initially put on supplemental O2 the pulse ox was not accurate.  Per my evaluation when patient was taken off of supplemental O2 she maintained her sats on room air.  She ambulated on room air without difficulty prior to discharge.  Hypotensive reading was inaccurate.  On my reevaluation patient's blood pressure was  110s/70s.  Asked nursing staff to update this prior to discharge.  Lab Tests: -I ordered, reviewed, and interpreted labs.   The pertinent results include:   Labs Reviewed - No data to display    EKG  EKG Interpretation  Date/Time:    Ventricular Rate:    PR Interval:    QRS Duration:   QT Interval:    QTC Calculation:   R Axis:     Text Interpretation:           Medicines ordered and prescription drug management: Meds ordered this encounter  Medications   HYDROcodone-acetaminophen (NORCO/VICODIN) 5-325 MG per tablet 1 tablet   HYDROcodone-acetaminophen (NORCO/VICODIN) 5-325 MG tablet    Sig: Take 1 tablet by mouth every 4 (four) hours as needed for severe pain.    Dispense:  10 tablet    Refill:  0    Order Specific Question:   Supervising Provider    Answer:   Noemi Chapel [[id number]]   HYDROcodone-acetaminophen (NORCO/VICODIN) 5-325 MG per tablet 1 tablet    -I have reviewed the patients home medicines and have made adjustments as needed  Reevaluation: After the interventions noted above, I reevaluated the patient and found that they have :improved  Co morbidities that complicate the patient evaluation  Past Medical History:  Diagnosis Date   Anxiety    Arthritis    "fingers, right toe" (08/09/2016)   Atrial fibrillation (HCC)    Basal cell carcinoma of lower leg, right    Bradycardia    a. Holter 08/30/16 showed profound bradycardia down to 30 during awake hours, several 2 second pauses, very frequent PVCs >8000 in 48 hours, and NSVT (longest of 14 beats).   Cardiomyopathy ([place])    a. EF 40% by echo 08/2016.   Daily headache    "I usually wake up w/a headache; sometimes it's a migraine" (08/09/2016)   Depression    Diverticulosis    Hx. of   Frequent PVCs    GERD (gastroesophageal reflux disease)    Hypercholesterolemia    Migraine    Mitral regurgitation    MVP (mitral valve prolapse)    NSVT (nonsustained ventricular tachycardia) ([place])    a. first  noted event monitor 08/2016.   Osteoporosis    PAF (paroxysmal atrial fibrillation) ([place])    a. in afib at time of echo 08/2016.   Pneumonia    Scoliosis    mild   Squamous cell carcinoma of neck    Stroke ([place]) 05/2018  Dispostion: Patient is appropriate for discharge.  Discharged in stable condition.  Return precautions discussed.   Final Clinical Impression(s) / ED Diagnoses Final diagnoses:  Closed fracture of sacrum with routine healing, unspecified portion of sacrum, subsequent encounter    Rx / DC Orders ED Discharge Orders          Ordered    HYDROcodone-acetaminophen (NORCO/VICODIN) 5-325 MG tablet  Every 4 hours PRN        06/16/22 1831              Evlyn Courier, PA-C 06/16/22 1846    Godfrey Pick, MD 06/22/22 [phone]

## 2022-06-16 NOTE — Progress Notes (Deleted)
Cardiology Clinic Note   Patient Name: Carol Hardin Date of Encounter: 06/16/2022  Primary Care Provider:  Celene Squibb, MD Primary Cardiologist:  Carlyle Dolly, MD  Patient Profile    ***  Past Medical History    Past Medical History:  Diagnosis Date   Anxiety    Arthritis    "fingers, right toe" (08/09/2016)   Atrial fibrillation ([place])    Basal cell carcinoma of lower leg, right    Bradycardia    a. Holter 08/30/16 showed profound bradycardia down to 30 during awake hours, several 2 second pauses, very frequent PVCs >8000 in 48 hours, and NSVT (longest of 14 beats).   Cardiomyopathy ([facility] [place])    a. EF 40% by echo 08/2016.   Daily headache    "I usually wake up w/a headache; sometimes it's a migraine" (08/09/2016)   Depression    Diverticulosis    Hx. of   Frequent PVCs    GERD (gastroesophageal reflux disease)    Hypercholesterolemia    Migraine    Mitral regurgitation    MVP (mitral valve prolapse)    NSVT (nonsustained ventricular tachycardia) ([place])    a. first noted event monitor 08/2016.   Osteoporosis    PAF (paroxysmal atrial fibrillation) ([place])    a. in afib at time of echo 08/2016.   Pneumonia    Scoliosis    mild   Squamous cell carcinoma of neck    Stroke ([place]) 05/2018   Past Surgical History:  Procedure Laterality Date   AV NODE ABLATION N/A 01/23/2019   Procedure: AV NODE ABLATION;  Surgeon: Thompson Grayer, MD;  Location: [facility];  Service: Cardiovascular;  Laterality: N/A;   BASAL CELL CARCINOMA EXCISION Right 1980s   RLE   BIV PACEMAKER INSERTION CRT-P N/A 01/23/2019   Procedure: BIV PACEMAKER INSERTION CRT-P;  Surgeon: Thompson Grayer, MD;  Location: [facility];  Service: Cardiovascular;  Laterality: N/A;   BLEPHAROPLASTY     BREAST BIOPSY Left 1970s   BREAST CYST ASPIRATION Left 1970s   BREAST CYST EXCISION Left 1970s   BUNIONECTOMY Bilateral    CATARACT EXTRACTION W/PHACO Right 04/05/2022   Procedure: CATARACT  EXTRACTION PHACO AND INTRAOCULAR LENS PLACEMENT (IOC);  Surgeon: Baruch Goldmann, MD;  Location: [facility];  Service: Ophthalmology;  Laterality: Right;  CDE 11.93   DILATION AND CURETTAGE OF UTERUS     EXCISIONAL HEMORRHOIDECTOMY  1970s   INTRAMEDULLARY (IM) NAIL INTERTROCHANTERIC Left 12/31/2017   Procedure: INTRAMEDULLARY (IM) NAIL INTERTROCHANTRIC;  Surgeon: Nicholes Stairs, MD;  Location: [facility];  Service: Orthopedics;  Laterality: Left;   KNEE ARTHROSCOPY Right 04/12/2007   KNEE ARTHROSCOPY Left 1998   SQUAMOUS CELL CARCINOMA EXCISION     "neck"   TONSILLECTOMY      Allergies  Allergies  Allergen Reactions   Omeprazole Other (See Comments)    siezure   Flagyl [Metronidazole] Nausea And Vomiting   Amiodarone    Aripiprazole Other (See Comments)    Unknown reaction   Clindamycin/Lincomycin Other (See Comments)    Bacterial infection / burning in throat   Gabapentin Itching   Hylan G-F 20 Other (See Comments)    Unknown reaction   Metoprolol Tartrate    Paroxetine    Paroxetine Hcl Other (See Comments)    Headaches (a long time ago- pt doesn't really remember)   Statins Other (See Comments)    No specific reaction given-patient states that she was advised not to take by physician  Sulfa Antibiotics Diarrhea and Other (See Comments)    Colitis    Toprol Xl [Metoprolol Succinate] Other (See Comments)    Dizziness--pt doesn't really remember    Vancomycin Itching and Other (See Comments)    Pt reports that they gave it too fast- she started having itching in the scalp   Penicillins Rash    DID THE REACTION INVOLVE: Swelling of the face/tongue/throat, SOB, or low BP? No Sudden or severe rash/hives, skin peeling, or the inside of the mouth or nose? Yes Did it require medical treatment? Unknown When did it last happen?  Over 10 years     If all above answers are "NO", may proceed with cephalosporin use.     History of Present Illness    ***  Home Medications     Prior to Admission medications   Medication Sig Start Date End Date Taking? Authorizing Provider  acetaminophen (TYLENOL) 325 MG tablet Take 325-975 mg by mouth daily.    [provider]  albuterol (VENTOLIN HFA) 108 (90 Base) MCG/ACT inhaler Inhale 2 puffs into the lungs every 6 (six) hours as needed for wheezing or shortness of breath. 04/29/22   Byrum, Rose Fillers, MD  apixaban (ELIQUIS) 2.5 MG TABS tablet TAKE 1 TABLET(2.5 MG) BY MOUTH TWICE DAILY 04/05/22   O'Neal, Cassie Freer, MD  azelastine (ASTELIN) 0.1 % nasal spray Place 2 sprays into both nostrils 2 (two) times daily as needed for rhinitis. 05/28/22   Valentina Shaggy, MD  bismuth subsalicylate (PEPTO BISMOL) 262 MG/15ML suspension Take 30 mLs by mouth as needed.    [provider]  carvedilol (COREG) 3.125 MG tablet Take 1 tablet (3.125 mg total) by mouth 2 (two) times daily with a meal. 03/31/22   O'Neal, Cassie Freer, MD  divalproex (DEPAKOTE ER) 250 MG 24 hr tablet Take 1 tablet (250 mg total) by mouth daily. Patient not taking: Reported on 06/04/2022 04/30/22   Pieter Partridge, DO  Ensure (ENSURE) Take 237 mLs by mouth daily.    [provider]  fexofenadine (ALLEGRA) 60 MG tablet Take 1 tablet (60 mg total) by mouth daily. 05/18/21   Valentina Shaggy, MD  Fluocinolone Acetonide Scalp 0.01 % OIL Apply 1 Application topically 2 (two) times daily as needed. Patient not taking: Reported on 06/04/2022 05/23/22   Dara Hoyer, FNP  furosemide (LASIX) 20 MG tablet Take 20 mg by mouth as needed for fluid.    [provider]  LORazepam (ATIVAN) 1 MG tablet Take 1 mg by mouth at bedtime.    [provider]  Polyethyl Glycol-Propyl Glycol (SYSTANE OP) Apply to eye as needed.    [provider]  triamcinolone ointment (KENALOG) 0.1 % Apply 1 application topically 2 (two) times daily. 11/20/21   Valentina Shaggy, MD  Vibegron (GEMTESA) 75 MG TABS Take 75 mg by mouth daily.    [provider]    Family History    Family History  Problem Relation Age of Onset   Heart failure Mother    Leukemia Mother    Heart failure Father    COPD Father    Alzheimer's disease Father    Alzheimer's disease Brother    Heart Problems Brother        stent   Heart Problems Son        stent    Allergic rhinitis Neg Hx    Angioedema Neg Hx    Asthma Neg Hx    Eczema Neg  Hx    Atopy Neg Hx    Immunodeficiency Neg Hx    Urticaria Neg Hx    Headache Neg Hx    She indicated that her mother is deceased. She indicated that her father is deceased. She indicated that her brother is deceased. She indicated that her maternal grandmother is deceased. She indicated that her maternal grandfather is deceased. She indicated that her paternal grandmother is deceased. She indicated that her paternal grandfather is deceased. She indicated that the status of her son is unknown. She indicated that the status of her neg hx is unknown.  Social History    Social History   Socioeconomic History   Marital status: Married    Spouse name: Not on file   Number of children: 4   Years of education: 2.5 yrs of college   Highest education level: Not on file  Occupational History   Not on file  Tobacco Use   Smoking status: Former    Packs/day: 1.00    Years: 20.00    Total pack years: 20.00    Types: Cigarettes    Quit date: 40    Years since quitting: 34.7   Smokeless tobacco: Never  Vaping Use   Vaping Use: Never used  Substance and Sexual Activity   Alcohol use: No    Comment: quit 1995, was occasional    Drug use: Never   Sexual activity: Not Currently  Other Topics Concern   Not on file  Social History Narrative   Lives at home with spouse   Caffeine: 2 cups coffee/day   Right handed   Social Determinants of Health   Financial Resource Strain: Medium Risk (08/17/2017)   Overall Financial Resource Strain (CARDIA)    Difficulty of Paying Living Expenses: Somewhat hard   Food Insecurity: No Food Insecurity (08/17/2017)   Hunger Vital Sign    Worried About Running Out of Food in the Last Year: Never true    Ran Out of Food in the Last Year: Never true  Transportation Needs: No Transportation Needs (08/17/2017)   PRAPARE - Hydrologist (Medical): No    Lack of Transportation (Non-Medical): No  Physical Activity: Inactive (08/17/2017)   Exercise Vital Sign    Days of Exercise per Week: 0 days    Minutes of Exercise per Session: 0 min  Stress: Stress Concern Present (08/17/2017)   [place]    Feeling of Stress : Rather much  Social Connections: Moderately Isolated (08/17/2017)   Social Connection and Isolation Panel [NHANES]    Frequency of Communication with Friends and Family: Once a week    Frequency of Social Gatherings with Friends and Family: Once a week    Attends Religious Services: Never    Marine scientist or Organizations: No    Attends Archivist Meetings: Never    Marital Status: Married  Human resources officer Violence: Not At Risk (08/17/2017)   Humiliation, Afraid, Rape, and Kick questionnaire    Fear of Current or Ex-Partner: No    Emotionally Abused: No    Physically Abused: No    Sexually Abused: No     Review of Systems    General:  No chills, fever, night sweats or weight changes.  Cardiovascular:  No chest pain, dyspnea on exertion, edema, orthopnea, palpitations, paroxysmal nocturnal dyspnea. Dermatological: No rash, lesions/masses Respiratory: No cough, dyspnea Urologic: No hematuria, dysuria Abdominal:   No nausea, vomiting,  diarrhea, bright red blood per rectum, melena, or hematemesis Neurologic:  No visual changes, wkns, changes in mental status. All other systems reviewed and are otherwise negative except as noted above.  Physical Exam    VS:  There were no vitals taken for this visit. , BMI There is no height or  weight on file to calculate BMI. GEN: Well nourished, well developed, in no acute distress. HEENT: normal. Neck: Supple, no JVD, carotid bruits, or masses. Cardiac: RRR, no murmurs, rubs, or gallops. No clubbing, cyanosis, edema.  Radials/DP/PT 2+ and equal bilaterally.  Respiratory:  Respirations regular and unlabored, clear to auscultation bilaterally. GI: Soft, nontender, nondistended, BS + x 4. MS: no deformity or atrophy. Skin: warm and dry, no rash. Neuro:  Strength and sensation are intact. Psych: Normal affect.  Accessory Clinical Findings    Recent Labs: 06/04/2022: B Natriuretic Peptide 712.1 06/14/2022: ALT 39; BUN 19; Creatinine, Ser 1.00; Hemoglobin 10.4; Platelets 162; Potassium 3.9; Sodium 138   Recent Lipid Panel    Component Value Date/Time   CHOL 134 08/11/2020 0000   TRIG 67 08/11/2020 0000   HDL 41 08/11/2020 0000   CHOLHDL 3.3 01/23/2019 0631   VLDL 13 01/23/2019 0631   LDLCALC 79 08/11/2020 0000    No BP recorded.  {Refresh Note OR Click here to enter BP  :1}***    ECG personally reviewed by me today- *** - No acute changes  Assessment & Plan   1.  ***   Jossie Ng. Kalub Morillo NP-C     06/16/2022, 7:59 AM [address] Office [phone] Fax [phone]  Notice: This dictation was prepared with Dragon dictation along with smaller phrase technology. Any transcriptional errors that result from this process are unintentional and may not be corrected upon review.  I spent***minutes examining this patient, reviewing medications, and using patient centered shared decision making involving her cardiac care.  Prior to her visit I spent greater than 20 minutes reviewing her past medical history,  medications, and prior cardiac tests.

## 2022-06-17 NOTE — Telephone Encounter (Signed)
Pt called back to cancel upcoming appt due to just getting out of hospital for fall. She states that the injury is going to make it hard to make this appt and she will call back to reschedule. She did not mention that she needed a call back for swelling and only requested to cancel appointment.

## 2022-06-17 NOTE — Telephone Encounter (Signed)
LMTCB

## 2022-06-18 NOTE — Telephone Encounter (Signed)
Pt is returning call and requested to still speak to nurse. Transferred to [place], [place].

## 2022-06-18 NOTE — Telephone Encounter (Signed)
I spoke with patient, she wanted a refill on her pain medication, she stated what they gave her was 10 tablets taking it every 4 hours. I did advise with patient that it was every 4 hours as needed. Patient made aware that we did not do pain medication here, I did advise with her she would need to see if her PCP for pain medication- she states she did discuss with her PCP already and they would need to see her. She states that she would not be able to do that, however I did discuss it was a good idea for her to see them- patient verbalized understanding.   She was advised to call us if she had any other issues. Otherwise keep upcoming appointment in December.

## 2022-06-18 NOTE — Telephone Encounter (Signed)
I attempted to contact patient husband. LVM to call back  Spoke with Dr.O'Neal- he advised that Mrs.Sobiech could just keep her appointment with [place] in December.   Advised to call back if continued questions/concerns.

## 2022-06-20 ENCOUNTER — Emergency Department (HOSPITAL_COMMUNITY): Payer: Medicare Other

## 2022-06-20 ENCOUNTER — Encounter (HOSPITAL_COMMUNITY): Payer: Self-pay

## 2022-06-20 ENCOUNTER — Inpatient Hospital Stay (HOSPITAL_COMMUNITY)
Admission: EM | Admit: 2022-06-20 | Discharge: 2022-06-26 | DRG: 291 | Disposition: A | Discharge status: Hospice | Payer: Medicare Other | Attending: Internal Medicine | Admitting: Internal Medicine

## 2022-06-20 ENCOUNTER — Other Ambulatory Visit: Payer: Self-pay

## 2022-06-20 DIAGNOSIS — E43 Unspecified severe protein-calorie malnutrition: Secondary | ICD-10-CM | POA: Insufficient documentation

## 2022-06-20 DIAGNOSIS — Z950 Presence of cardiac pacemaker: Secondary | ICD-10-CM

## 2022-06-20 DIAGNOSIS — Z20822 Contact with and (suspected) exposure to covid-19: Secondary | ICD-10-CM | POA: Diagnosis not present

## 2022-06-20 DIAGNOSIS — M419 Scoliosis, unspecified: Secondary | ICD-10-CM | POA: Diagnosis not present

## 2022-06-20 DIAGNOSIS — Z8249 Family history of ischemic heart disease and other diseases of the circulatory system: Secondary | ICD-10-CM

## 2022-06-20 DIAGNOSIS — R296 Repeated falls: Secondary | ICD-10-CM | POA: Diagnosis present

## 2022-06-20 DIAGNOSIS — W19XXXD Unspecified fall, subsequent encounter: Secondary | ICD-10-CM | POA: Diagnosis present

## 2022-06-20 DIAGNOSIS — N1832 Chronic kidney disease, stage 3b: Secondary | ICD-10-CM | POA: Diagnosis present

## 2022-06-20 DIAGNOSIS — Z9981 Dependence on supplemental oxygen: Secondary | ICD-10-CM

## 2022-06-20 DIAGNOSIS — E1122 Type 2 diabetes mellitus with diabetic chronic kidney disease: Secondary | ICD-10-CM | POA: Diagnosis not present

## 2022-06-20 DIAGNOSIS — I5043 Acute on chronic combined systolic (congestive) and diastolic (congestive) heart failure: Secondary | ICD-10-CM | POA: Diagnosis present

## 2022-06-20 DIAGNOSIS — I4510 Unspecified right bundle-branch block: Secondary | ICD-10-CM | POA: Diagnosis present

## 2022-06-20 DIAGNOSIS — I5033 Acute on chronic diastolic (congestive) heart failure: Secondary | ICD-10-CM | POA: Diagnosis present

## 2022-06-20 DIAGNOSIS — I4821 Permanent atrial fibrillation: Secondary | ICD-10-CM | POA: Diagnosis present

## 2022-06-20 DIAGNOSIS — I130 Hypertensive heart and chronic kidney disease with heart failure and stage 1 through stage 4 chronic kidney disease, or unspecified chronic kidney disease: Principal | ICD-10-CM | POA: Diagnosis present

## 2022-06-20 DIAGNOSIS — J9611 Chronic respiratory failure with hypoxia: Secondary | ICD-10-CM | POA: Diagnosis not present

## 2022-06-20 DIAGNOSIS — M810 Age-related osteoporosis without current pathological fracture: Secondary | ICD-10-CM | POA: Diagnosis not present

## 2022-06-20 DIAGNOSIS — I5023 Acute on chronic systolic (congestive) heart failure: Secondary | ICD-10-CM | POA: Diagnosis present

## 2022-06-20 DIAGNOSIS — I110 Hypertensive heart disease with heart failure: Secondary | ICD-10-CM | POA: Diagnosis not present

## 2022-06-20 DIAGNOSIS — Z7189 Other specified counseling: Secondary | ICD-10-CM | POA: Diagnosis not present

## 2022-06-20 DIAGNOSIS — E7800 Pure hypercholesterolemia, unspecified: Secondary | ICD-10-CM | POA: Diagnosis not present

## 2022-06-20 DIAGNOSIS — Z85828 Personal history of other malignant neoplasm of skin: Secondary | ICD-10-CM | POA: Diagnosis not present

## 2022-06-20 DIAGNOSIS — I509 Heart failure, unspecified: Secondary | ICD-10-CM | POA: Insufficient documentation

## 2022-06-20 DIAGNOSIS — Z66 Do not resuscitate: Secondary | ICD-10-CM | POA: Diagnosis not present

## 2022-06-20 DIAGNOSIS — I080 Rheumatic disorders of both mitral and aortic valves: Secondary | ICD-10-CM | POA: Diagnosis present

## 2022-06-20 DIAGNOSIS — K219 Gastro-esophageal reflux disease without esophagitis: Secondary | ICD-10-CM | POA: Diagnosis present

## 2022-06-20 DIAGNOSIS — Z820 Family history of epilepsy and other diseases of the nervous system: Secondary | ICD-10-CM

## 2022-06-20 DIAGNOSIS — Z789 Other specified health status: Secondary | ICD-10-CM | POA: Diagnosis not present

## 2022-06-20 DIAGNOSIS — R0602 Shortness of breath: Secondary | ICD-10-CM | POA: Diagnosis not present

## 2022-06-20 DIAGNOSIS — Z79899 Other long term (current) drug therapy: Secondary | ICD-10-CM | POA: Diagnosis not present

## 2022-06-20 DIAGNOSIS — Z8673 Personal history of transient ischemic attack (TIA), and cerebral infarction without residual deficits: Secondary | ICD-10-CM

## 2022-06-20 DIAGNOSIS — S32110D Nondisplaced Zone I fracture of sacrum, subsequent encounter for fracture with routine healing: Secondary | ICD-10-CM

## 2022-06-20 DIAGNOSIS — N189 Chronic kidney disease, unspecified: Secondary | ICD-10-CM | POA: Diagnosis not present

## 2022-06-20 DIAGNOSIS — Z87891 Personal history of nicotine dependence: Secondary | ICD-10-CM

## 2022-06-20 DIAGNOSIS — Z7401 Bed confinement status: Secondary | ICD-10-CM | POA: Diagnosis not present

## 2022-06-20 DIAGNOSIS — S3210XA Unspecified fracture of sacrum, initial encounter for closed fracture: Secondary | ICD-10-CM | POA: Diagnosis present

## 2022-06-20 DIAGNOSIS — Z888 Allergy status to other drugs, medicaments and biological substances status: Secondary | ICD-10-CM

## 2022-06-20 DIAGNOSIS — Z880 Allergy status to penicillin: Secondary | ICD-10-CM

## 2022-06-20 DIAGNOSIS — E46 Unspecified protein-calorie malnutrition: Secondary | ICD-10-CM | POA: Diagnosis present

## 2022-06-20 DIAGNOSIS — R54 Age-related physical debility: Secondary | ICD-10-CM | POA: Diagnosis present

## 2022-06-20 DIAGNOSIS — Z806 Family history of leukemia: Secondary | ICD-10-CM

## 2022-06-20 DIAGNOSIS — Z7901 Long term (current) use of anticoagulants: Secondary | ICD-10-CM

## 2022-06-20 DIAGNOSIS — Z515 Encounter for palliative care: Secondary | ICD-10-CM

## 2022-06-20 DIAGNOSIS — I482 Chronic atrial fibrillation, unspecified: Secondary | ICD-10-CM | POA: Diagnosis present

## 2022-06-20 DIAGNOSIS — Z881 Allergy status to other antibiotic agents status: Secondary | ICD-10-CM

## 2022-06-20 DIAGNOSIS — I495 Sick sinus syndrome: Secondary | ICD-10-CM | POA: Diagnosis present

## 2022-06-20 DIAGNOSIS — E871 Hypo-osmolality and hyponatremia: Secondary | ICD-10-CM | POA: Diagnosis not present

## 2022-06-20 DIAGNOSIS — Z8589 Personal history of malignant neoplasm of other organs and systems: Secondary | ICD-10-CM

## 2022-06-20 DIAGNOSIS — E876 Hypokalemia: Secondary | ICD-10-CM | POA: Diagnosis not present

## 2022-06-20 DIAGNOSIS — R52 Pain, unspecified: Secondary | ICD-10-CM | POA: Diagnosis not present

## 2022-06-20 DIAGNOSIS — I420 Dilated cardiomyopathy: Secondary | ICD-10-CM | POA: Diagnosis not present

## 2022-06-20 DIAGNOSIS — Z825 Family history of asthma and other chronic lower respiratory diseases: Secondary | ICD-10-CM

## 2022-06-20 DIAGNOSIS — N179 Acute kidney failure, unspecified: Secondary | ICD-10-CM | POA: Diagnosis not present

## 2022-06-20 DIAGNOSIS — Z711 Person with feared health complaint in whom no diagnosis is made: Secondary | ICD-10-CM | POA: Diagnosis not present

## 2022-06-20 DIAGNOSIS — R0902 Hypoxemia: Secondary | ICD-10-CM | POA: Diagnosis not present

## 2022-06-20 DIAGNOSIS — F419 Anxiety disorder, unspecified: Secondary | ICD-10-CM | POA: Diagnosis present

## 2022-06-20 DIAGNOSIS — R001 Bradycardia, unspecified: Secondary | ICD-10-CM | POA: Diagnosis not present

## 2022-06-20 DIAGNOSIS — Z6821 Body mass index (BMI) 21.0-21.9, adult: Secondary | ICD-10-CM

## 2022-06-20 LAB — COMPREHENSIVE METABOLIC PANEL
ALT: 31 U/L (ref 0–44)
AST: 47 U/L — ABNORMAL HIGH (ref 15–41)
Albumin: 2.9 g/dL — ABNORMAL LOW (ref 3.5–5.0)
Alkaline Phosphatase: 58 U/L (ref 38–126)
Anion gap: 9 (ref 5–15)
BUN: 28 mg/dL — ABNORMAL HIGH (ref 8–23)
CO2: 22 mmol/L (ref 22–32)
Calcium: 9 mg/dL (ref 8.9–10.3)
Chloride: 102 mmol/L (ref 98–111)
Creatinine, Ser: 1.01 mg/dL — ABNORMAL HIGH (ref 0.44–1.00)
GFR, Estimated: 54 mL/min — ABNORMAL LOW (ref >60)
Glucose, Bld: 103 mg/dL — ABNORMAL HIGH (ref 70–99)
Potassium: 4.6 mmol/L (ref 3.5–5.1)
Sodium: 133 mmol/L — ABNORMAL LOW (ref 135–145)
Total Bilirubin: 1.4 mg/dL — ABNORMAL HIGH (ref 0.3–1.2)
Total Protein: 7.9 g/dL (ref 6.5–8.1)

## 2022-06-20 LAB — SARS CORONAVIRUS 2 BY RT PCR: SARS Coronavirus 2 by RT PCR: NEGATIVE

## 2022-06-20 LAB — CBC
HCT: 35.7 % — ABNORMAL LOW (ref 36.0–46.0)
Hemoglobin: 11.6 g/dL — ABNORMAL LOW (ref 12.0–15.0)
MCH: 33.9 pg (ref 26.0–34.0)
MCHC: 32.5 g/dL (ref 30.0–36.0)
MCV: 104.4 fL — ABNORMAL HIGH (ref 80.0–100.0)
Platelets: 207 10*3/uL (ref 150–400)
RBC: 3.42 MIL/uL — ABNORMAL LOW (ref 3.87–5.11)
RDW: 15.2 % (ref 11.5–15.5)
WBC: 6.8 10*3/uL (ref 4.0–10.5)
nRBC: 0 % (ref 0.0–0.2)

## 2022-06-20 LAB — TROPONIN I (HIGH SENSITIVITY)
Troponin I (High Sensitivity): 19 ng/L — ABNORMAL HIGH (ref <18)
Troponin I (High Sensitivity): 18 ng/L — ABNORMAL HIGH (ref <18)

## 2022-06-20 LAB — BRAIN NATRIURETIC PEPTIDE: B Natriuretic Peptide: 2262.6 pg/mL — ABNORMAL HIGH (ref 0.0–100.0)

## 2022-06-20 MED ORDER — OXYCODONE-ACETAMINOPHEN 5-325 MG PO TABS
1.0000 | ORAL_TABLET | Freq: Once | ORAL | Status: AC
  Administered 2022-06-20: 1 via ORAL
  Filled 2022-06-20: qty 1

## 2022-06-20 MED ORDER — ENSURE ENLIVE PO LIQD
237.0000 mL | Freq: Every day | ORAL | Status: DC
  Administered 2022-06-21 – 2022-06-22 (×2): 237 mL via ORAL

## 2022-06-20 MED ORDER — FUROSEMIDE 10 MG/ML IJ SOLN: 40.0000 mg | Freq: Two times a day (BID) | INTRAMUSCULAR | Status: DC

## 2022-06-20 MED ORDER — ALBUTEROL SULFATE HFA 108 (90 BASE) MCG/ACT IN AERS: 2.0000 | INHALATION_SPRAY | Freq: Four times a day (QID) | RESPIRATORY_TRACT | Status: DC | PRN

## 2022-06-20 MED ORDER — APIXABAN 2.5 MG PO TABS
2.5000 mg | ORAL_TABLET | Freq: Two times a day (BID) | ORAL | Status: DC
  Administered 2022-06-20 – 2022-06-25 (×11): 2.5 mg via ORAL
  Filled 2022-06-20 (×12): qty 1

## 2022-06-20 MED ORDER — CARVEDILOL 3.125 MG PO TABS
3.1250 mg | ORAL_TABLET | Freq: Two times a day (BID) | ORAL | Status: DC
  Administered 2022-06-20 – 2022-06-23 (×5): 3.125 mg via ORAL
  Filled 2022-06-20 (×6): qty 1

## 2022-06-20 MED ORDER — ENSURE PO LIQD: 237.0000 mL | Freq: Every day | ORAL | Status: DC

## 2022-06-20 MED ORDER — ACETAMINOPHEN 325 MG PO TABS
650.0000 mg | ORAL_TABLET | ORAL | Status: DC | PRN
  Administered 2022-06-20 – 2022-06-22 (×3): 650 mg via ORAL
  Filled 2022-06-20 (×2): qty 2

## 2022-06-20 MED ORDER — FUROSEMIDE 10 MG/ML IJ SOLN
40.0000 mg | Freq: Once | INTRAMUSCULAR | Status: AC
  Administered 2022-06-20: 40 mg via INTRAVENOUS
  Filled 2022-06-20: qty 4

## 2022-06-20 MED ORDER — FUROSEMIDE 10 MG/ML IJ SOLN
20.0000 mg | Freq: Two times a day (BID) | INTRAMUSCULAR | Status: DC
Start: 2022-06-20 — End: 2022-06-21
  Administered 2022-06-20 – 2022-06-21 (×2): 20 mg via INTRAVENOUS
  Filled 2022-06-20 (×2): qty 2

## 2022-06-20 MED ORDER — AZELASTINE HCL 0.1 % NA SOLN: 2.0000 | Freq: Two times a day (BID) | NASAL | Status: DC | PRN

## 2022-06-20 MED ORDER — SODIUM CHLORIDE 0.9% FLUSH: 3.0000 mL | INTRAVENOUS | Status: DC | PRN

## 2022-06-20 MED ORDER — FENTANYL CITRATE PF 50 MCG/ML IJ SOSY: 25.0000 ug | PREFILLED_SYRINGE | Freq: Once | INTRAMUSCULAR | Status: DC

## 2022-06-20 MED ORDER — FENTANYL CITRATE PF 50 MCG/ML IJ SOSY
25.0000 ug | PREFILLED_SYRINGE | Freq: Once | INTRAMUSCULAR | Status: AC | PRN
  Administered 2022-06-20: 25 ug via INTRAVENOUS
  Filled 2022-06-20: qty 1

## 2022-06-20 MED ORDER — SODIUM CHLORIDE 0.9% FLUSH
3.0000 mL | Freq: Two times a day (BID) | INTRAVENOUS | Status: DC
  Administered 2022-06-20 – 2022-06-25 (×10): 3 mL via INTRAVENOUS

## 2022-06-20 MED ORDER — SODIUM CHLORIDE 0.9 % IV SOLN: 250.0000 mL | INTRAVENOUS | Status: DC | PRN

## 2022-06-20 MED ORDER — LORAZEPAM 1 MG PO TABS
1.0000 mg | ORAL_TABLET | Freq: Every day | ORAL | Status: DC
  Administered 2022-06-20 – 2022-06-22 (×3): 1 mg via ORAL
  Filled 2022-06-20 (×3): qty 1

## 2022-06-20 MED ORDER — ONDANSETRON HCL 4 MG/2ML IJ SOLN
4.0000 mg | Freq: Four times a day (QID) | INTRAMUSCULAR | Status: DC | PRN
  Administered 2022-06-21 – 2022-06-24 (×2): 4 mg via INTRAVENOUS
  Filled 2022-06-20 (×2): qty 2

## 2022-06-20 MED ORDER — ACETAMINOPHEN 325 MG PO TABS
325.0000 mg | ORAL_TABLET | Freq: Every day | ORAL | Status: DC
  Administered 2022-06-20: 650 mg via ORAL
  Administered 2022-06-21: 325 mg via ORAL
  Administered 2022-06-23 – 2022-06-25 (×3): 650 mg via ORAL
  Filled 2022-06-20: qty 3
  Filled 2022-06-20 (×5): qty 2

## 2022-06-20 MED ORDER — ALBUTEROL SULFATE (2.5 MG/3ML) 0.083% IN NEBU
2.5000 mg | INHALATION_SOLUTION | Freq: Four times a day (QID) | RESPIRATORY_TRACT | Status: DC | PRN
Start: 2022-06-20 — End: 2022-06-26

## 2022-06-20 NOTE — Progress Notes (Addendum)
D/W on call cardiology who reviewed monitor info and felt patient's HR is controlled, cardiology further reviewed patient's outpatient record which showed patient has tolerated poorly to past CHF regimen including other beta-blocker then Coreg, she failed digoxin, and not a candidate for AICD or CRT therapy.  Cardiology recommended IV Lasix 20 mg twice daily for now and consider palliative care if poor response.

## 2022-06-20 NOTE — ED Notes (Signed)
RN placed pt on 2L for comfort

## 2022-06-20 NOTE — ED Provider Triage Note (Signed)
Emergency Medicine Provider Triage Evaluation Note  Carol Hardin , a 88 y.o. female  was evaluated in triage.  Pt complains of shortness of breath and worsening sacral pain with known fracture. Patient states that she was up all night due to pain which has not been relieved with po pain meds. Deneis any new injury or fall. She states that she has also felt more short of breath than normal and is having increasing swelling in her legs and abdomen. Is normally on 2L [place] at night but has been requiring it more during the day.  Review of Systems  Positive:  Negative:   Physical Exam  BP 110/74 (BP Location: Right Arm)   Pulse (!) 46   Temp 97.8 F (36.6 C) (Oral)   Resp 16   SpO2 92%  Gen:   Awake, no distress   Resp:  Normal effort  MSK:   Moves extremities without difficulty  Other:    Medical Decision Making  Medically screening exam initiated at 10:44 AM.  Appropriate orders placed.  KRYSTN DERMODY was informed that the remainder of the evaluation will be completed by another provider, this initial triage assessment does not replace that evaluation, and the importance of remaining in the ED until their evaluation is complete.     Bud Face, PA-C 06/20/22 1048

## 2022-06-20 NOTE — H&P (Signed)
History and Physical    Carol Hardin DOB: August 19, 1933 DOA: 06/20/2022  PCP: Celene Squibb, MD (Confirm with patient/family/NH records and if not entered, this has to be entered at [facility] point of entry) Patient coming from: Home  I have personally briefly reviewed patient's old medical records in [place]  Chief Complaint: SOB, leg swelling  HPI: Carol Hardin is a 88 y.o. female with medical history significant of chronic HFrEF, chronic A-fib, sick sinus syndrome status post bi-chamber PPM, chronic hypoxic respiratory failure on at bedtime oxygen at home, presented with persistent hypotension and worsening of hypoxia.  Patient started to have bilateral lower extremity swelling 2 to 3 weeks ago, went to see cardiology, and her CHF medication adjusted.  She used to only take Lasix on as needed basis, and on last cardiology telephone visit, lisinopril was discontinued and patient was started on p.o. Lasix 20 mg daily.  Patient however did not see significant improvement of her swelling.  And last night, family took her pulse ox which was 70s.  She denies any chest pain palpitations lightheadedness or blurry vision, no chest pain no urinary symptoms or diarrhea.  ED Course: Blood pressure borderline low, heart rate in baseline A-fib borderline tachycardia and occasional pacing.  Chest x-ray showed bilateral pulmonary congestion.  Creatinine 1.0, K4.6, sodium 133, WBC 6.8.  Review of Systems: As per HPI otherwise 14 point review of systems negative.   Past Medical History:  Diagnosis Date   Anxiety    Arthritis    "fingers, right toe" (08/09/2016)   Atrial fibrillation (HCC)    Basal cell carcinoma of lower leg, right    Bradycardia    a. Holter 08/30/16 showed profound bradycardia down to 30 during awake hours, several 2 second pauses, very frequent PVCs >8000 in 48 hours, and NSVT (longest of 14 beats).   Cardiomyopathy ([place])    a. EF 40% by echo 08/2016.    Daily headache    "I usually wake up w/a headache; sometimes it's a migraine" (08/09/2016)   Depression    Diverticulosis    Hx. of   Frequent PVCs    GERD (gastroesophageal reflux disease)    Hypercholesterolemia    Migraine    Mitral regurgitation    MVP (mitral valve prolapse)    NSVT (nonsustained ventricular tachycardia) ([place])    a. first noted event monitor 08/2016.   Osteoporosis    PAF (paroxysmal atrial fibrillation) ([place])    a. in afib at time of echo 08/2016.   Pneumonia    Scoliosis    mild   Squamous cell carcinoma of neck    Stroke ([place]) 05/2018    Past Surgical History:  Procedure Laterality Date   AV NODE ABLATION N/A 01/23/2019   Procedure: AV NODE ABLATION;  Surgeon: Thompson Grayer, MD;  Location: [facility];  Service: Cardiovascular;  Laterality: N/A;   BASAL CELL CARCINOMA EXCISION Right 1980s   RLE   BIV PACEMAKER INSERTION CRT-P N/A 01/23/2019   Procedure: BIV PACEMAKER INSERTION CRT-P;  Surgeon: Thompson Grayer, MD;  Location: [facility];  Service: Cardiovascular;  Laterality: N/A;   BLEPHAROPLASTY     BREAST BIOPSY Left 1970s   BREAST CYST ASPIRATION Left 1970s   BREAST CYST EXCISION Left 1970s   BUNIONECTOMY Bilateral    CATARACT EXTRACTION W/PHACO Right 04/05/2022   Procedure: CATARACT EXTRACTION PHACO AND INTRAOCULAR LENS PLACEMENT (IOC);  Surgeon: Baruch Goldmann, MD;  Location: [facility];  Service:  Ophthalmology;  Laterality: Right;  CDE 11.93   DILATION AND CURETTAGE OF UTERUS     EXCISIONAL HEMORRHOIDECTOMY  1970s   INTRAMEDULLARY (IM) NAIL INTERTROCHANTERIC Left 12/31/2017   Procedure: INTRAMEDULLARY (IM) NAIL INTERTROCHANTRIC;  Surgeon: Nicholes Stairs, MD;  Location: [facility];  Service: Orthopedics;  Laterality: Left;   KNEE ARTHROSCOPY Right 04/12/2007   KNEE ARTHROSCOPY Left 1998   SQUAMOUS CELL CARCINOMA EXCISION     "neck"   TONSILLECTOMY       reports that she quit smoking about 34 years ago. Her smoking use included  cigarettes. She has a 20.00 pack-year smoking history. She has never used smokeless tobacco. She reports that she does not drink alcohol and does not use drugs.  Allergies  Allergen Reactions   Omeprazole Other (See Comments)    siezure   Flagyl [Metronidazole] Nausea And Vomiting   Amiodarone    Aripiprazole Other (See Comments)    Unknown reaction   Clindamycin/Lincomycin Other (See Comments)    Bacterial infection / burning in throat   Gabapentin Itching   Hylan G-F 20 Other (See Comments)    Unknown reaction   Metoprolol Tartrate    Paroxetine    Paroxetine Hcl Other (See Comments)    Headaches (a long time ago- pt doesn't really remember)   Statins Other (See Comments)    No specific reaction given-patient states that she was advised not to take by physician   Sulfa Antibiotics Diarrhea and Other (See Comments)    Colitis    Toprol Xl [Metoprolol Succinate] Other (See Comments)    Dizziness--pt doesn't really remember    Vancomycin Itching and Other (See Comments)    Pt reports that they gave it too fast- she started having itching in the scalp   Penicillins Rash    DID THE REACTION INVOLVE: Swelling of the face/tongue/throat, SOB, or low BP? No Sudden or severe rash/hives, skin peeling, or the inside of the mouth or nose? Yes Did it require medical treatment? Unknown When did it last happen?  Over 10 years     If all above answers are "NO", may proceed with cephalosporin use.     Family History  Problem Relation Age of Onset   Heart failure Mother    Leukemia Mother    Heart failure Father    COPD Father    Alzheimer's disease Father    Alzheimer's disease Brother    Heart Problems Brother        stent   Heart Problems Son        stent    Allergic rhinitis Neg Hx    Angioedema Neg Hx    Asthma Neg Hx    Eczema Neg Hx    Atopy Neg Hx    Immunodeficiency Neg Hx    Urticaria Neg Hx    Headache Neg Hx      Prior to Admission medications   Medication Sig  Start Date End Date Taking? Authorizing Provider  acetaminophen (TYLENOL) 325 MG tablet Take 325-975 mg by mouth daily.    [provider]  albuterol (VENTOLIN HFA) 108 (90 Base) MCG/ACT inhaler Inhale 2 puffs into the lungs every 6 (six) hours as needed for wheezing or shortness of breath. 04/29/22   Byrum, Rose Fillers, MD  apixaban (ELIQUIS) 2.5 MG TABS tablet TAKE 1 TABLET(2.5 MG) BY MOUTH TWICE DAILY 04/05/22   O'Neal, Cassie Freer, MD  azelastine (ASTELIN) 0.1 % nasal spray Place 2 sprays into both nostrils 2 (two) times  daily as needed for rhinitis. 05/28/22   Valentina Shaggy, MD  carvedilol (COREG) 3.125 MG tablet Take 1 tablet (3.125 mg total) by mouth 2 (two) times daily with a meal. 03/31/22   O'Neal, Cassie Freer, MD  Cholecalciferol (VITAMIN D-3) 25 MCG (1000 UT) CAPS Take 1 capsule by mouth daily.    [provider]  divalproex (DEPAKOTE ER) 250 MG 24 hr tablet Take 1 tablet (250 mg total) by mouth daily. Patient not taking: Reported on 06/04/2022 04/30/22   Pieter Partridge, DO  Ensure (ENSURE) Take 237 mLs by mouth daily.    [provider]  Fluocinolone Acetonide Scalp 0.01 % OIL Apply 1 Application topically 2 (two) times daily as needed. Patient not taking: Reported on 06/04/2022 05/23/22   Dara Hoyer, FNP  furosemide (LASIX) 20 MG tablet Take 20 mg by mouth as needed for fluid.    [provider]  HYDROcodone-acetaminophen (NORCO/VICODIN) 5-325 MG tablet Take 1 tablet by mouth every 4 (four) hours as needed for severe pain. 06/16/22   Deatra Canter, Amjad, PA-C  LORazepam (ATIVAN) 1 MG tablet Take 1 mg by mouth at bedtime.    [provider]  Polyethyl Glycol-Propyl Glycol (SYSTANE OP) Apply to eye as needed.    [provider]  Vibegron (GEMTESA) 75 MG TABS Take 75 mg by mouth daily.    [provider]    Physical Exam: Vitals:   06/20/22 1230 06/20/22 1300 06/20/22 1310 06/20/22 1330  BP: 116/82 102/74  102/75  Pulse: 68 (!)  33 64 (!) 36  Resp: 19 18 (!) 21 16  Temp:      TempSrc:      SpO2: 90% 93% 91% 97%    Constitutional: NAD, calm, comfortable Vitals:   06/20/22 1230 06/20/22 1300 06/20/22 1310 06/20/22 1330  BP: 116/82 102/74  102/75  Pulse: 68 (!) 33 64 (!) 36  Resp: 19 18 (!) 21 16  Temp:      TempSrc:      SpO2: 90% 93% 91% 97%   Eyes: PERRL, lids and conjunctivae normal ENMT: Mucous membranes are moist. Posterior pharynx clear of any exudate or lesions.Normal dentition.  Neck: normal, supple, no masses, no thyromegaly Respiratory: clear to auscultation bilaterally, no wheezing, bilateral fine crackles to the mid level of lung fields, increasing breathing effort t. No accessory muscle use.  Cardiovascular: Irregular heart rate, no murmurs / rubs / gallops.  1+ extremity edema. 2+ pedal pulses. No carotid bruits.  Abdomen: no tenderness, no masses palpated. No hepatosplenomegaly. Bowel sounds positive.  Musculoskeletal: no clubbing / cyanosis. No joint deformity upper and lower extremities. Good ROM, no contractures. Normal muscle tone.  Skin: no rashes, lesions, ulcers. No induration Neurologic: CN 2-12 grossly intact. Sensation intact, DTR normal. Strength 5/5 in all 4.  Psychiatric: Normal judgment and insight. Alert and oriented x 3. Normal mood.     Labs on Admission: I have personally reviewed following labs and imaging studies  CBC: Recent Labs  Lab 06/14/22 1922 06/20/22 1053  WBC 5.3 6.8  NEUTROABS 3.2  --   HGB 10.4* 11.6*  HCT 32.6* 35.7*  MCV 105.2* 104.4*  PLT 162 680   Basic Metabolic Panel: Recent Labs  Lab 06/14/22 1922 06/20/22 1053  NA 138 133*  K 3.9 4.6  CL 107 102  CO2 26 22  GLUCOSE 112* 103*  BUN 19 28*  CREATININE 1.00 1.01*  CALCIUM 8.7* 9.0   GFR: Estimated Creatinine Clearance: 33.1 mL/min (A) (  by C-G formula based on SCr of 1.01 mg/dL (H)). Liver Function Tests: Recent Labs  Lab 06/14/22 1922 06/20/22 1053  AST 53* 47*  ALT 39 31   ALKPHOS 51 58  BILITOT 1.3* 1.4*  PROT 8.0 7.9  ALBUMIN 3.3* 2.9*   No results for input(s): "LIPASE", "AMYLASE" in the last 168 hours. No results for input(s): "AMMONIA" in the last 168 hours. Coagulation Profile: No results for input(s): "INR", "PROTIME" in the last 168 hours. Cardiac Enzymes: No results for input(s): "CKTOTAL", "CKMB", "CKMBINDEX", "TROPONINI" in the last 168 hours. BNP (last 3 results) No results for input(s): "PROBNP" in the last 8760 hours. HbA1C: No results for input(s): "HGBA1C" in the last 72 hours. CBG: No results for input(s): "GLUCAP" in the last 168 hours. Lipid Profile: No results for input(s): "CHOL", "HDL", "LDLCALC", "TRIG", "CHOLHDL", "LDLDIRECT" in the last 72 hours. Thyroid Function Tests: No results for input(s): "TSH", "T4TOTAL", "FREET4", "T3FREE", "THYROIDAB" in the last 72 hours. Anemia Panel: No results for input(s): "VITAMINB12", "FOLATE", "FERRITIN", "TIBC", "IRON", "RETICCTPCT" in the last 72 hours. Urine analysis:    Component Value Date/Time   COLORURINE YELLOW 08/20/2019 2047   APPEARANCEUR CLEAR 08/20/2019 2047   LABSPEC >1.046 (H) 08/20/2019 2047   PHURINE 6.0 08/20/2019 2047   GLUCOSEU NEGATIVE 08/20/2019 2047   HGBUR SMALL (A) 08/20/2019 2047   BILIRUBINUR negative 04/18/2021 1542   KETONESUR negative 04/18/2021 1542   KETONESUR 5 (A) 08/20/2019 2047   PROTEINUR >=300 (A) 04/18/2021 1542   PROTEINUR NEGATIVE 08/20/2019 2047   UROBILINOGEN 1.0 04/18/2021 1542   UROBILINOGEN 0.2 08/16/2008 2359   NITRITE Positive (A) 04/18/2021 1542   NITRITE NEGATIVE 08/20/2019 2047   LEUKOCYTESUR Small (1+) (A) 04/18/2021 1542   LEUKOCYTESUR NEGATIVE 08/20/2019 2047    Radiological Exams on Admission: DG Chest 2 View  Result Date: 06/20/2022 CLINICAL DATA:  Shortness of breath. EXAM: CHEST - 2 VIEW COMPARISON:  06/04/2022 FINDINGS: Left-sided pacemaker unchanged. Lungs are adequately inflated and demonstrate new bibasilar  opacification left greater than right likely small effusions with associated atelectasis although infection in the lung bases is possible. Subtle hazy prominence of the central pulmonary vessels which can be seen with mild vascular congestion. Stable moderate cardiomegaly. Remainder of the exam is unchanged. IMPRESSION: 1. New bibasilar opacification left greater than right likely small effusions with associated atelectasis, although infection in the lung bases is possible. 2. Stable moderate cardiomegaly with suggestion of mild vascular congestion. Electronically Signed   By: Marin Olp M.D.   On: 06/20/2022 11:59    EKG: Independently reviewed.  In and out of rapid A-fib with occasional pacing  Assessment/Plan Principal Problem:   CHF (congestive heart failure) (HCC) Active Problems:   Acute on chronic diastolic heart failure ([place])  (please populate well all problems here in Problem List. (For example, if patient is on BP meds at home and you resume or decide to hold them, it is a problem that needs to be her. Same for CAD, COPD, HLD and so on)  Acute on chronic HF R EF decompensation -Significant fluid overload and hypoxia -Appears that despite borderline low blood pressure, patient tolerated initial 40 mg IV Lasix in the ED.  Decided to continue 40 mg IV Lasix twice daily, cardiology was consulted in the ED. -Echo was done recently showed depressed EF 25%. -Monitor blood pressure closely, may need inotrope support -Etiology of the decompensation, presently I suspect uncontrolled A-fib however given her borderline low blood pressure, currently cannot increase her beta-blocker  dosage and appears that she has allergy to metoprolol, as of now decided to continue Coreg 3.125 twice daily. -Patient used to be on digoxin but discontinued due to poor tolerance and abnormal LFTs.  A-fib with RVR -As above -Continue Eliquis renally dosed  Acute hypoxic respiratory failure -Secondary to CHF  decompensation, management as above.  CKD stage IIIb -Creatinine of stable, volume status is overloaded, management as above.  Moderate protein calorie malnutrition -BMI= 21, continue protein supplements.     DVT prophylaxis: Eliquis Code Status: Full code Family Communication: Husband and 2 sons at bedside Disposition Plan: Patient sick with CHF decompensation, expect more than 2 midnight hospital stay. Consults called: Cardiology Admission status: Telemetry admission   Lequita Halt MD [facility] Pager [phone]  06/20/2022, 2:31 PM

## 2022-06-20 NOTE — ED Provider Notes (Signed)
[facility] EMERGENCY DEPARTMENT Provider Note   CSN: [id number] Arrival date & time: 06/20/22  6967     History {Add pertinent medical, surgical, social history, OB history to HPI:1} No chief complaint on file.   Carol Hardin is a 88 y.o. female.  HPI     88 year old female with a history of atrial fibrillation on Eliquis, history of CVA, low back pain, sick sinus syndrome, echo in May 2023 with an ejection fraction of 25%   On August 25 she was sent to the emergency department by cardiology for concern of dyspnea  Had CT completed at [facility] which shows a partially visualized acute nondisplaced fracture of the left sacral ala.  She was seen September 4, and then presented again September 6 to the [facility] emergency department with continued pain was given a prescription for Norco.  Does not feel that helped.    Shortness of breath, really worsening last night, worse laying down flat, worse tryin gto move along with the pain Pain really severe, not sleeping, not eating  Nausea, no vomiting, no chest pain. Not having npalpitations, has pacemaker in place  No sig cough, no fever  Took lasix 2 days ago for leg swelling  Past Medical History:  Diagnosis Date   Anxiety    Arthritis    "fingers, right toe" (08/09/2016)   Atrial fibrillation (HCC)    Basal cell carcinoma of lower leg, right    Bradycardia    a. Holter 08/30/16 showed profound bradycardia down to 30 during awake hours, several 2 second pauses, very frequent PVCs >8000 in 48 hours, and NSVT (longest of 14 beats).   Cardiomyopathy ([place])    a. EF 40% by echo 08/2016.   Daily headache    "I usually wake up w/a headache; sometimes it's a migraine" (08/09/2016)   Depression    Diverticulosis    Hx. of   Frequent PVCs    GERD (gastroesophageal reflux disease)    Hypercholesterolemia    Migraine    Mitral regurgitation    MVP (mitral valve prolapse)    NSVT (nonsustained ventricular  tachycardia) ([place])    a. first noted event monitor 08/2016.   Osteoporosis    PAF (paroxysmal atrial fibrillation) ([place])    a. in afib at time of echo 08/2016.   Pneumonia    Scoliosis    mild   Squamous cell carcinoma of neck    Stroke ([place]) 05/2018     Home Medications Prior to Admission medications   Medication Sig Start Date End Date Taking? Authorizing Provider  acetaminophen (TYLENOL) 325 MG tablet Take 325-975 mg by mouth daily.    [provider]  albuterol (VENTOLIN HFA) 108 (90 Base) MCG/ACT inhaler Inhale 2 puffs into the lungs every 6 (six) hours as needed for wheezing or shortness of breath. 04/29/22   Byrum, Rose Fillers, MD  apixaban (ELIQUIS) 2.5 MG TABS tablet TAKE 1 TABLET(2.5 MG) BY MOUTH TWICE DAILY 04/05/22   O'Neal, Cassie Freer, MD  azelastine (ASTELIN) 0.1 % nasal spray Place 2 sprays into both nostrils 2 (two) times daily as needed for rhinitis. 05/28/22   Valentina Shaggy, MD  carvedilol (COREG) 3.125 MG tablet Take 1 tablet (3.125 mg total) by mouth 2 (two) times daily with a meal. 03/31/22   O'Neal, Cassie Freer, MD  Cholecalciferol (VITAMIN D-3) 25 MCG (1000 UT) CAPS Take 1 capsule by mouth daily.    [provider]  divalproex (DEPAKOTE ER) 250  MG 24 hr tablet Take 1 tablet (250 mg total) by mouth daily. Patient not taking: Reported on 06/04/2022 04/30/22   Pieter Partridge, DO  Ensure (ENSURE) Take 237 mLs by mouth daily.    [provider]  Fluocinolone Acetonide Scalp 0.01 % OIL Apply 1 Application topically 2 (two) times daily as needed. Patient not taking: Reported on 06/04/2022 05/23/22   Dara Hoyer, FNP  furosemide (LASIX) 20 MG tablet Take 20 mg by mouth as needed for fluid.    [provider]  HYDROcodone-acetaminophen (NORCO/VICODIN) 5-325 MG tablet Take 1 tablet by mouth every 4 (four) hours as needed for severe pain. 06/16/22   Deatra Canter, Amjad, PA-C  LORazepam (ATIVAN) 1 MG tablet Take 1 mg by mouth at bedtime.    [provider]  Polyethyl Glycol-Propyl Glycol (SYSTANE OP) Apply to eye as needed.    [provider]  Vibegron (GEMTESA) 75 MG TABS Take 75 mg by mouth daily.    [provider]      Allergies    Omeprazole, Flagyl [metronidazole], Amiodarone, Aripiprazole, Clindamycin/lincomycin, Gabapentin, Hylan g-f 20, Metoprolol tartrate, Paroxetine, Paroxetine hcl, Statins, Sulfa antibiotics, Toprol xl [metoprolol succinate], Vancomycin, and Penicillins    Review of Systems   Review of Systems  Physical Exam Updated Vital Signs BP 110/74 (BP Location: Right Arm)   Pulse (!) 46   Temp 97.8 F (36.6 C) (Oral)   Resp 16   SpO2 92%  Physical Exam  ED Results / Procedures / Treatments   Labs (all labs ordered are listed, but only abnormal results are displayed) Labs Reviewed  CBC - Abnormal; Notable for the following components:      Result Value   RBC 3.42 (*)    Hemoglobin 11.6 (*)    HCT 35.7 (*)    MCV 104.4 (*)    All other components within normal limits  COMPREHENSIVE METABOLIC PANEL  BRAIN NATRIURETIC PEPTIDE  TROPONIN I (HIGH SENSITIVITY)    EKG None  Radiology No results found.  Procedures Procedures  {Document cardiac monitor, telemetry assessment procedure when appropriate:1}  Medications Ordered in ED Medications  oxyCODONE-acetaminophen (PERCOCET/ROXICET) 5-325 MG per tablet 1 tablet (has no administration in time range)    ED Course/ Medical Decision Making/ A&P                           Medical Decision Making  ***  {Document critical care time when appropriate:1} {Document review of labs and clinical decision tools ie heart score, Chads2Vasc2 etc:1}  {Document your independent review of radiology images, and any outside records:1} {Document your discussion with family members, caretakers, and with consultants:1} {Document social determinants of health affecting pt's care:1} {Document your decision making why or why not admission,  treatments were needed:1} Final Clinical Impression(s) / ED Diagnoses Final diagnoses:  None    Rx / DC Orders ED Discharge Orders     None

## 2022-06-20 NOTE — ED Triage Notes (Signed)
Patient here with low  sats this am as spouse reports only wears oxygen at night.patiernt with coccyx fracture and worsening pain. Patient reports some SOB. Placed on oxygen at 2l

## 2022-06-21 ENCOUNTER — Encounter (HOSPITAL_COMMUNITY): Payer: Self-pay | Admitting: Internal Medicine

## 2022-06-21 ENCOUNTER — Ambulatory Visit: Payer: Medicare Other | Admitting: General Practice

## 2022-06-21 DIAGNOSIS — I4510 Unspecified right bundle-branch block: Secondary | ICD-10-CM | POA: Diagnosis present

## 2022-06-21 DIAGNOSIS — N189 Chronic kidney disease, unspecified: Secondary | ICD-10-CM | POA: Diagnosis not present

## 2022-06-21 DIAGNOSIS — I5043 Acute on chronic combined systolic (congestive) and diastolic (congestive) heart failure: Secondary | ICD-10-CM | POA: Diagnosis present

## 2022-06-21 DIAGNOSIS — E1122 Type 2 diabetes mellitus with diabetic chronic kidney disease: Secondary | ICD-10-CM | POA: Diagnosis present

## 2022-06-21 DIAGNOSIS — I4821 Permanent atrial fibrillation: Secondary | ICD-10-CM | POA: Diagnosis present

## 2022-06-21 DIAGNOSIS — I482 Chronic atrial fibrillation, unspecified: Secondary | ICD-10-CM | POA: Diagnosis not present

## 2022-06-21 DIAGNOSIS — N179 Acute kidney failure, unspecified: Secondary | ICD-10-CM

## 2022-06-21 DIAGNOSIS — E46 Unspecified protein-calorie malnutrition: Secondary | ICD-10-CM | POA: Diagnosis not present

## 2022-06-21 DIAGNOSIS — E7800 Pure hypercholesterolemia, unspecified: Secondary | ICD-10-CM | POA: Diagnosis present

## 2022-06-21 DIAGNOSIS — I080 Rheumatic disorders of both mitral and aortic valves: Secondary | ICD-10-CM | POA: Diagnosis present

## 2022-06-21 DIAGNOSIS — Z6821 Body mass index (BMI) 21.0-21.9, adult: Secondary | ICD-10-CM | POA: Diagnosis not present

## 2022-06-21 DIAGNOSIS — I509 Heart failure, unspecified: Secondary | ICD-10-CM | POA: Insufficient documentation

## 2022-06-21 DIAGNOSIS — J9611 Chronic respiratory failure with hypoxia: Secondary | ICD-10-CM | POA: Diagnosis present

## 2022-06-21 DIAGNOSIS — M419 Scoliosis, unspecified: Secondary | ICD-10-CM | POA: Diagnosis present

## 2022-06-21 DIAGNOSIS — I130 Hypertensive heart and chronic kidney disease with heart failure and stage 1 through stage 4 chronic kidney disease, or unspecified chronic kidney disease: Secondary | ICD-10-CM | POA: Diagnosis present

## 2022-06-21 DIAGNOSIS — W19XXXD Unspecified fall, subsequent encounter: Secondary | ICD-10-CM | POA: Diagnosis present

## 2022-06-21 DIAGNOSIS — E876 Hypokalemia: Secondary | ICD-10-CM | POA: Diagnosis present

## 2022-06-21 DIAGNOSIS — E871 Hypo-osmolality and hyponatremia: Secondary | ICD-10-CM | POA: Diagnosis present

## 2022-06-21 DIAGNOSIS — E43 Unspecified severe protein-calorie malnutrition: Secondary | ICD-10-CM | POA: Diagnosis present

## 2022-06-21 DIAGNOSIS — M810 Age-related osteoporosis without current pathological fracture: Secondary | ICD-10-CM | POA: Diagnosis present

## 2022-06-21 DIAGNOSIS — I420 Dilated cardiomyopathy: Secondary | ICD-10-CM | POA: Diagnosis present

## 2022-06-21 DIAGNOSIS — Z20822 Contact with and (suspected) exposure to covid-19: Secondary | ICD-10-CM | POA: Diagnosis present

## 2022-06-21 DIAGNOSIS — F419 Anxiety disorder, unspecified: Secondary | ICD-10-CM | POA: Diagnosis present

## 2022-06-21 DIAGNOSIS — Z85828 Personal history of other malignant neoplasm of skin: Secondary | ICD-10-CM | POA: Diagnosis not present

## 2022-06-21 DIAGNOSIS — S3210XA Unspecified fracture of sacrum, initial encounter for closed fracture: Secondary | ICD-10-CM | POA: Diagnosis present

## 2022-06-21 DIAGNOSIS — Z515 Encounter for palliative care: Secondary | ICD-10-CM | POA: Diagnosis not present

## 2022-06-21 DIAGNOSIS — Z66 Do not resuscitate: Secondary | ICD-10-CM | POA: Diagnosis present

## 2022-06-21 DIAGNOSIS — Z79899 Other long term (current) drug therapy: Secondary | ICD-10-CM | POA: Diagnosis not present

## 2022-06-21 DIAGNOSIS — I495 Sick sinus syndrome: Secondary | ICD-10-CM | POA: Diagnosis present

## 2022-06-21 DIAGNOSIS — I5023 Acute on chronic systolic (congestive) heart failure: Secondary | ICD-10-CM | POA: Diagnosis not present

## 2022-06-21 DIAGNOSIS — N1832 Chronic kidney disease, stage 3b: Secondary | ICD-10-CM | POA: Diagnosis present

## 2022-06-21 LAB — BASIC METABOLIC PANEL
Anion gap: 9 (ref 5–15)
BUN: 31 mg/dL — ABNORMAL HIGH (ref 8–23)
CO2: 22 mmol/L (ref 22–32)
Calcium: 8.4 mg/dL — ABNORMAL LOW (ref 8.9–10.3)
Chloride: 100 mmol/L (ref 98–111)
Creatinine, Ser: 1.33 mg/dL — ABNORMAL HIGH (ref 0.44–1.00)
GFR, Estimated: 39 mL/min — ABNORMAL LOW (ref >60)
Glucose, Bld: 94 mg/dL (ref 70–99)
Potassium: 3.7 mmol/L (ref 3.5–5.1)
Sodium: 131 mmol/L — ABNORMAL LOW (ref 135–145)

## 2022-06-21 MED ORDER — FUROSEMIDE 10 MG/ML IJ SOLN
40.0000 mg | Freq: Two times a day (BID) | INTRAMUSCULAR | Status: DC
  Administered 2022-06-21: 40 mg via INTRAVENOUS
  Filled 2022-06-21 (×2): qty 4

## 2022-06-21 MED ORDER — OXYCODONE HCL 5 MG PO TABS
5.0000 mg | ORAL_TABLET | ORAL | Status: DC | PRN
  Administered 2022-06-21 – 2022-06-24 (×8): 5 mg via ORAL
  Filled 2022-06-21 (×8): qty 1

## 2022-06-21 MED ORDER — MORPHINE SULFATE (PF) 2 MG/ML IV SOLN: 0.5000 mg | INTRAVENOUS | Status: DC | PRN

## 2022-06-21 MED ORDER — MORPHINE SULFATE (PF) 2 MG/ML IV SOLN: 1.0000 mg | INTRAVENOUS | Status: DC | PRN

## 2022-06-21 MED ORDER — BISACODYL 5 MG PO TBEC
5.0000 mg | DELAYED_RELEASE_TABLET | Freq: Once | ORAL | Status: AC
Start: 2022-06-21 — End: 2022-06-21
  Administered 2022-06-21: 5 mg via ORAL
  Filled 2022-06-21: qty 1

## 2022-06-21 NOTE — Assessment & Plan Note (Signed)
Consult nutrition for recommendations Patient very deconditioned and poor oral intake.

## 2022-06-21 NOTE — Assessment & Plan Note (Signed)
09/04 sacral alla fracture.   Patient continue with significant pain, worse with movement.  Plan to add oral oxycodone for moderate pain and IV morphine for severe pain Continue PT and OT.  Out of bed to chair as tolerated.

## 2022-06-21 NOTE — Evaluation (Signed)
Physical Therapy Evaluation Patient Details Name: Carol Hardin MRN: [id number] DOB: [birth year] Today's Date: 06/21/2022  History of Present Illness  88 yo female admitted 9/10 with hypoxia and hypotension. PMhx: 9/4 fall in yard with visit to ED with nondisplaced sacral ala fx. HFrEF, Afib, SSS s/p PPM, chronic respiratory failure on nocturnal O2, HLD, GERD, depression  Clinical Impression  Pt pleasant and reports difficulty at home since fall last week due to pain and required use of RW and unable to get in shower due to pain. Pt with education for transfers and use of RW. Pt with decreased strength, transfers, gait and function who will benefit from acute therapy to maximize mobility, safety and balance   Supine 92/64 (71) Sitting 97/66 (77) HR 102 92% on RA       Recommendations for follow up therapy are one component of a multi-disciplinary discharge planning process, led by the attending physician.  Recommendations may be updated based on patient status, additional functional criteria and insurance authorization.  Follow Up Recommendations Home health PT      Assistance Recommended at Discharge Intermittent Supervision/Assistance  Patient can return home with the following  A little help with walking and/or transfers;A little help with bathing/dressing/bathroom;Assistance with cooking/housework;Direct supervision/assist for financial management;Assist for transportation;Help with stairs or ramp for entrance    Equipment Recommendations None recommended by PT  Recommendations for Other Services       Functional Status Assessment Patient has had a recent decline in their functional status and demonstrates the ability to make significant improvements in function in a reasonable and predictable amount of time.     Precautions / Restrictions Precautions Precautions: Fall      Mobility  Bed Mobility Overal bed mobility: Needs Assistance Bed Mobility: Supine to Sit      Supine to sit: Min guard, HOB elevated     General bed mobility comments: HOB 20 degrees, rail and increased time with guarding to rise    Transfers Overall transfer level: Needs assistance   Transfers: Sit to/from Stand Sit to Stand: Min guard           General transfer comment: cues for hand placement and sequence with increased time to rise    Ambulation/Gait Ambulation/Gait assistance: Min guard Gait Distance (Feet): 150 Feet Assistive device: Rolling walker (2 wheels) Gait Pattern/deviations: Step-through pattern, Decreased stride length, Trunk flexed   Gait velocity interpretation: 1.31 - 2.62 ft/sec, indicative of limited community ambulator   General Gait Details: cues for posture, proximity to RW and to avoid obstacles as pt running into objects x 2, pt able to self-regulate distance with sats >92% on RA  Stairs            Wheelchair Mobility    Modified Rankin (Stroke Patients Only)       Balance Overall balance assessment: Needs assistance, History of Falls Sitting-balance support: No upper extremity supported, Feet supported Sitting balance-Leahy Scale: Fair Sitting balance - Comments: EOb and toilet without support   Standing balance support: Bilateral upper extremity supported, Reliant on assistive device for balance Standing balance-Leahy Scale: Poor Standing balance comment: RW for gait, recent fall                             Pertinent Vitals/Pain Pain Assessment Pain Assessment: 0-10 Pain Score: 8  Pain Location: sacrum Pain Descriptors / Indicators: Aching, Guarding, Constant Pain Intervention(s): Limited activity within patient's tolerance, Monitored during session, Repositioned  Home Living Family/patient expects to be discharged to:: Private residence Living Arrangements: Spouse/significant other Available Help at Discharge: Family;Available 24 hours/day Type of Home: House Home Access: Stairs to enter    [employer] of Steps: 5   Home Layout: One level Home Equipment: Conservation officer, nature (2 wheels);Cane - single point Additional Comments: painting and sewing as hobbies    Prior Function Prior Level of Function : Independent/Modified Independent             Mobility Comments: pt reports fall Wed       Hand Dominance        Extremity/Trunk Assessment   Upper Extremity Assessment Upper Extremity Assessment: Generalized weakness    Lower Extremity Assessment Lower Extremity Assessment: Generalized weakness    Cervical / Trunk Assessment Cervical / Trunk Assessment: Kyphotic  Communication   Communication: No difficulties  Cognition Arousal/Alertness: Awake/alert Behavior During Therapy: WFL for tasks assessed/performed Overall Cognitive Status: Within Functional Limits for tasks assessed                                          General Comments      Exercises     Assessment/Plan    PT Assessment Patient needs continued PT services  PT Problem List Decreased strength;Decreased mobility;Decreased activity tolerance;Decreased balance;Decreased knowledge of use of DME;Pain       PT Treatment Interventions Gait training;Stair training;Functional mobility training;Therapeutic activities;Patient/family education;DME instruction;Therapeutic exercise;Balance training    PT Goals (Current goals can be found in the Care Plan section)  Acute Rehab PT Goals Patient Stated Goal: return home and be able to paint PT Goal Formulation: With patient/family Time For Goal Achievement: 07/05/22 Potential to Achieve Goals: Fair    Frequency Min 3X/week     Co-evaluation               AM-PAC PT "6 Clicks" Mobility  Outcome Measure Help needed turning from your back to your side while in a flat bed without using bedrails?: A Little Help needed moving from lying on your back to sitting on the side of a flat bed without using bedrails?: A  Little Help needed moving to and from a bed to a chair (including a wheelchair)?: A Little Help needed standing up from a chair using your arms (e.g., wheelchair or bedside chair)?: A Little Help needed to walk in hospital room?: A Little Help needed climbing 3-5 steps with a railing? : A Lot 6 Click Score: 17    End of Session   Activity Tolerance: Patient tolerated treatment well Patient left: in chair;with call bell/phone within reach;with chair alarm set;with family/visitor present Nurse Communication: Mobility status PT Visit Diagnosis: Other abnormalities of gait and mobility (R26.89);Difficulty in walking, not elsewhere classified (R26.2)    Time: 9622-2979 PT Time Calculation (min) (ACUTE ONLY): 31 min   Charges:   PT Evaluation $PT Eval Moderate Complexity: 1 Mod PT Treatments $Therapeutic Activity: 8-22 mins        Bayard Males, PT Acute Rehabilitation Services Office: [phone]   Duanna Runk B Kolyn Rozario 06/21/2022, 1:00 PM

## 2022-06-21 NOTE — Progress Notes (Signed)
Heart Failure Navigator Progress Note  Assessed for Heart & Vascular TOC clinic readiness.  Patient does not meet criteria per Dr. Cathlean Sauer . Cardiology recommended IV lasix '20Mg'$  twice daily for now and consider palliative care if poor response ( Dr. Sherrye Payor)  Navigator available for reassessment of patient.   Earnestine Leys, BSN, Clinical cytogeneticist Only

## 2022-06-21 NOTE — Hospital Course (Signed)
Mrs. Carol Hardin was admitted to the hospital with the working diagnosis decompensated heart failure  88 yo female with the past medical history of heart failure, sinus node dysfunction sp bi chamber pacemaker, chronic hypoxemic respiratory failure who presented with hypotension and hypoxemia. Reported 2 to 3 weeks of lower extremity edema. Her diuretic therapy was modified as outpatient to 20 mg daily of furosemide and lisinopril was discontinued. Unfortunately her symptoms continue to worsen and her 02 saturation went down to 70%. She presented the ED for further evaluation. On her initial physical examination her blood pressure was 116/82, HR 68, RR 19 and 02 saturation 90% on room air. Lungs with bolateral rales and increased work of breathing, heart with S1 and S2 present, irregularly irregular with no gallops, abdomen with no distention, positive lower extremity edema.   Na 133, K 4,6 Cl 102 bicarbonate 22, glucose 103 bun 28 and cr 1,0  BNP 2,262  Wbc 6,8 hgb 11,6 plt 207  Sars covid 19 negative   Chest radiograph with cardiomegaly, bilateral hilar vascular congestion, with cephalization of the vasculature. Small bilateral pleural effusions. Pace maker in place with one atrial lead and biventricular leads.   EKG 91 bpm, left axis deviation, right bundle branch block, atrial fibrillation rhythm with intermittent ventricular pacing every 2 to every other intrinsic beat, no significant ST segment or T wave changes.   Patient was placed on diuresis for volume overload.  Patient has been considered terminal heart failure as outpatient, she has not tolerated well medical therapy.  Recommendations for palliative therapy.   09/12 patient with improvement in her volume status Placed on midodrine for blood pressure support and transitioned to oral furosemide.

## 2022-06-21 NOTE — Evaluation (Signed)
Occupational Therapy Evaluation Patient Details Name: Carol Hardin MRN: [id number] DOB: 1933-01-28 Today's Date: 06/21/2022   History of Present Illness 88 yo female admitted 9/10 with hypoxia and hypotension. PMhx: 9/4 fall in yard with visit to ED with nondisplaced sacral ala fx. HFrEF, Afib, SSS s/p PPM, chronic respiratory failure on nocturnal O2, HLD, GERD, depression   Clinical Impression   Prior to this admission, patient living with husband independently. Patient independent in ADLs prior to fall on 9/4, however has been greatly limited due to pain. Currently, patient is severely limited by pain, but is min A for ADLs and transfers. Patient agreeable to positioning from OT, but declining other mobility with OT during session (of note patient has been up ambulating and using BSC multiple times throughout the day). OT recommending [place] services at discharge, OT will continue to follow.      Recommendations for follow up therapy are one component of a multi-disciplinary discharge planning process, led by the attending physician.  Recommendations may be updated based on patient status, additional functional criteria and insurance authorization.   Follow Up Recommendations  Home health OT    Assistance Recommended at Discharge Intermittent Supervision/Assistance  Patient can return home with the following A little help with walking and/or transfers;A lot of help with bathing/dressing/bathroom;Assistance with cooking/housework;Direct supervision/assist for medications management;Direct supervision/assist for financial management;Assist for transportation;Help with stairs or ramp for entrance    Functional Status Assessment  Patient has had a recent decline in their functional status and demonstrates the ability to make significant improvements in function in a reasonable and predictable amount of time.  Equipment Recommendations  None recommended by OT (Patient has DME needed)     Recommendations for Other Services       Precautions / Restrictions Precautions Precautions: Fall Restrictions Weight Bearing Restrictions: No      Mobility Bed Mobility Overal bed mobility: Needs Assistance Bed Mobility: Rolling Rolling: Independent              Transfers                   General transfer comment: patient declining for OT however has been transferring to [facility] with NT and RN (min A) and walked in hallway with PT at min gaurd level      Balance                                           ADL either performed or assessed with clinical judgement   ADL Overall ADL's : Needs assistance/impaired Eating/Feeding: Set up;Sitting   Grooming: Set up;Sitting   Upper Body Bathing: Minimal assistance;Sitting   Lower Body Bathing: Moderate assistance;Maximal assistance;Sitting/lateral leans;Sit to/from stand   Upper Body Dressing : Minimal assistance;Sitting   Lower Body Dressing: Moderate assistance;Maximal assistance;Sitting/lateral leans;Sit to/from stand   Toilet Transfer: Minimal Investment banker, corporate Details (indicate cue type and reason): patient not peforming for OT however RN and NT assisting patient to [facility] 2x within hour of patient canceling and OT assessment and reporting min A         Functional mobility during ADLs: Minimal assistance General ADL Comments: Patient presenting with decreased activity tolerance and significant pain in R ribcage impeding activity     Vision Baseline Vision/History: 1 Wears glasses Ability to See in Adequate Light: 0 Adequate Patient Visual Report: No change from baseline  Perception     Praxis      Pertinent Vitals/Pain Pain Assessment Pain Assessment: 0-10 Pain Score: 10-Worst pain ever Pain Location: R ribcage Pain Descriptors / Indicators: Aching, Guarding, Constant Pain Intervention(s): Limited activity within patient's tolerance, Repositioned, Patient  requesting pain meds-RN notified     Hand Dominance     Extremity/Trunk Assessment Upper Extremity Assessment Upper Extremity Assessment: Generalized weakness   Lower Extremity Assessment Lower Extremity Assessment: Defer to PT evaluation   Cervical / Trunk Assessment Cervical / Trunk Assessment: Kyphotic   Communication Communication Communication: No difficulties   Cognition Arousal/Alertness: Awake/alert Behavior During Therapy: WFL for tasks assessed/performed Overall Cognitive Status: Within Functional Limits for tasks assessed                                       General Comments       Exercises     Shoulder Instructions      Home Living Family/patient expects to be discharged to:: Private residence Living Arrangements: Spouse/significant other Available Help at Discharge: Family;Available 24 hours/day Type of Home: House Home Access: Stairs to enter [employer] of Steps: 5   Home Layout: One level     Bathroom Shower/Tub: Occupational psychologist: Standard     Home Equipment: Conservation officer, nature (2 wheels);Cane - single point;Shower seat   Additional Comments: painting and sewing as hobbies      Prior Functioning/Environment Prior Level of Function : Independent/Modified Independent             Mobility Comments: pt reports fall Wed ADLs Comments: independent prior to fall        OT Problem List: Decreased strength;Decreased activity tolerance;Impaired balance (sitting and/or standing);Decreased safety awareness;Pain;Increased edema      OT Treatment/Interventions: Self-care/ADL training;Therapeutic exercise;Energy conservation;DME and/or AE instruction;Manual therapy;Therapeutic activities;Patient/family education;Balance training    OT Goals(Current goals can be found in the care plan section) Acute Rehab OT Goals Patient Stated Goal: to be in less pain OT Goal Formulation: With patient Time For Goal  Achievement: 07/05/22 Potential to Achieve Goals: Good  OT Frequency: Min 2X/week    Co-evaluation              AM-PAC OT "6 Clicks" Daily Activity     Outcome Measure Help from another person eating meals?: A Little Help from another person taking care of personal grooming?: A Little Help from another person toileting, which includes using toliet, bedpan, or urinal?: A Little Help from another person bathing (including washing, rinsing, drying)?: A Lot Help from another person to put on and taking off regular upper body clothing?: A Little Help from another person to put on and taking off regular lower body clothing?: A Lot 6 Click Score: 16   End of Session Nurse Communication: Mobility status;Patient requests pain meds  Activity Tolerance: Patient limited by pain Patient left: in bed;with call bell/phone within reach;with family/visitor present  OT Visit Diagnosis: Unsteadiness on feet (R26.81);Other abnormalities of gait and mobility (R26.89);Muscle weakness (generalized) (M62.81);History of falling (Z91.81);Pain Pain - Right/Left: Right Pain - part of body:  (Ribcage)                Time: 0272-5366 OT Time Calculation (min): 11 min Charges:  OT General Charges $OT Visit: 1 Visit OT Evaluation $OT Eval Moderate Complexity: 1 Mod  Corinne Ports E. Karey Suthers, OTR/L Acute Rehabilitation Services [phone]   [facility]  Candyce Churn 06/21/2022, 3:13 PM

## 2022-06-21 NOTE — Progress Notes (Signed)
Progress Note   Patient: Carol Hardin ZDG:[id number] DOB: [birth year] DOA: 06/20/2022     0 DOS: the patient was seen and examined on 06/21/2022   Brief hospital course: [name] was admitted to the hospital with the working diagnosis decompensated heart failure  88 yo female with the past medical history of heart failure, sinus node dysfunction sp bi chamber pacemaker, chronic hypoxemic respiratory failure who presented with hypotension and hypoxemia. Reported 2 to 3 weeks of lower extremity edema. Her diuretic therapy was modified as outpatient to 20 mg daily of furosemide and lisinopril was discontinued. Unfortunately her symptoms continue to worsen and her 02 saturation went down to 70%. She presented the ED for further evaluation. On her initial physical examination her blood pressure was 116/82, HR 68, RR 19 and 02 saturation 90% on room air. Lungs with bolateral rales and increased work of breathing, heart with S1 and S2 present, irregularly irregular with no gallops, abdomen with no distention, positive lower extremity edema.   Na 133, K 4,6 Cl 102 bicarbonate 22, glucose 103 bun 28 and cr 1,0  BNP 2,262  Wbc 6,8 hgb 11,6 plt 207  Sars covid 19 negative   Chest radiograph with cardiomegaly, bilateral hilar vascular congestion, with cephalization of the vasculature. Small bilateral pleural effusions. Pace maker in place with one atrial lead and biventricular leads.   EKG 91 bpm, left axis deviation, right bundle branch block, atrial fibrillation rhythm with intermittent ventricular pacing every 2 to every other intrinsic beat, no significant ST segment or T wave changes.   Patient was placed on diuresis for volume overload.  Patient has been considered terminal heart failure as outpatient, she has not tolerated well medical therapy.  Recommendations for palliative therapy.   Assessment and Plan: * Acute on chronic systolic heart failure (HCC) Echocardiogram with reduced LV  systolic function EF 59%, with global hypokinesis. Moderate dilatation of LV internal cavity, preserved RV systolic function. Mild to moderate MR.   Lower extremity edema has improved but not back to baseline Urine output is documented at 500 cc over last 24 hrs Systolic blood pressure has been 93 to 97 mmHg Plan to increase furosemide to 40 mg IV q12 hrs Hold on B blocker and RAS inhibition due to risk of hypotension.  Patient with very high risk for worsening heart failure, will talk to her and her family about advance directives. Consult palliative care team.     Atrial fibrillation, chronic ([place]) Patient with intermittent ventricular pacing. Continue close telemetry monitoring.  Continue rate control with carvedilol and anticoagulation with apixaban.   Acute kidney injury superimposed on chronic kidney disease (HCC) CKD stage 3b. Hyponatremia,   Renal function with serum cr at 1,33 with K at 3,7 and serum bicarbonate at 22, Plan to continue diuresis with furosemide IV Follow up renal function in am.  Ad Kcl to prevent hypokalemia.    Malnutrition, calorie ([place]) Consult nutrition for recommendations Patient very deconditioned and poor oral intake.   Sacral fracture ([place]) 09/04 sacral alla fracture.   Patient continue with significant pain, worse with movement.  Plan to add oral oxycodone for moderate pain and IV morphine for severe pain Continue PT and OT.  Out of bed to chair as tolerated.         Subjective: Patient continue to have pain at her pelvis, worse with movement, her dyspnea has improved but not back to baseline   Physical Exam: Vitals:   06/21/22 0754 06/21/22 0807 06/21/22 1121  06/21/22 1140  BP:  '97/67 92/64 97/66 '$  Pulse: 97  83 83  Resp: 17  17   Temp: 97.7 F (36.5 C)  98 F (36.7 C)   TempSrc: Oral  Oral   SpO2: 95%  93% 95%  Weight:      Height:       Neurology awake and alert ENT with mild pallor Cardiovascular with S1 and S2  present and irregular with no gallops, positive murmur at the left lower sternal border. Respiratory with bilateral rales with no wheezing Abdomen not distended  Data Reviewed:    Family Communication: I spoke with patient's husband and son at the bedside, we talked in detail about patient's condition, plan of care and prognosis and all questions were addressed.   Disposition: Status is: Observation The patient will require care spanning > 2 midnights and should be moved to inpatient because: IV furosemide and pain control   Planned Discharge Destination: Home  Author: Tawni Millers, MD 06/21/2022 3:55 PM  For on call review [email or web address].

## 2022-06-21 NOTE — Assessment & Plan Note (Addendum)
Echocardiogram with reduced LV systolic function EF 27%, with global hypokinesis. Moderate dilatation of LV internal cavity, preserved RV systolic function. Mild to moderate MR.   Lower extremity edema has improved but not back to baseline Urine output is documented at 500 cc over last 24 hrs Systolic blood pressure has been 93 to 97 mmHg Plan to increase furosemide to 40 mg IV q12 hrs Hold on B blocker and RAS inhibition due to risk of hypotension.  Patient with very high risk for worsening heart failure, will talk to her and her family about advance directives. Consult palliative care team.

## 2022-06-21 NOTE — Assessment & Plan Note (Signed)
Patient with intermittent ventricular pacing. Continue close telemetry monitoring.  Continue rate control with carvedilol and anticoagulation with apixaban.

## 2022-06-21 NOTE — Assessment & Plan Note (Signed)
CKD stage 3b. Hyponatremia, hypokalemia   Her volume status has improved, renal function with serum cr at 1,20 with K at 3,2 and serum bicarbonate at 27. Na 135  Add 40 meq Kcl for hypokalemia   Change furosemide to po and follow up renal function in am. Out of bed to chair tid as tolerated.

## 2022-06-22 DIAGNOSIS — N179 Acute kidney failure, unspecified: Secondary | ICD-10-CM | POA: Diagnosis not present

## 2022-06-22 DIAGNOSIS — I482 Chronic atrial fibrillation, unspecified: Secondary | ICD-10-CM | POA: Diagnosis not present

## 2022-06-22 DIAGNOSIS — E46 Unspecified protein-calorie malnutrition: Secondary | ICD-10-CM | POA: Diagnosis not present

## 2022-06-22 DIAGNOSIS — I5023 Acute on chronic systolic (congestive) heart failure: Secondary | ICD-10-CM | POA: Diagnosis not present

## 2022-06-22 LAB — BASIC METABOLIC PANEL
Anion gap: 7 (ref 5–15)
BUN: 22 mg/dL (ref 8–23)
CO2: 27 mmol/L (ref 22–32)
Calcium: 8.2 mg/dL — ABNORMAL LOW (ref 8.9–10.3)
Chloride: 101 mmol/L (ref 98–111)
Creatinine, Ser: 1.2 mg/dL — ABNORMAL HIGH (ref 0.44–1.00)
GFR, Estimated: 44 mL/min — ABNORMAL LOW (ref >60)
Glucose, Bld: 89 mg/dL (ref 70–99)
Potassium: 3.2 mmol/L — ABNORMAL LOW (ref 3.5–5.1)
Sodium: 135 mmol/L (ref 135–145)

## 2022-06-22 MED ORDER — ADULT MULTIVITAMIN W/MINERALS CH
1.0000 | ORAL_TABLET | Freq: Every day | ORAL | Status: DC
  Administered 2022-06-22 – 2022-06-25 (×4): 1 via ORAL
  Filled 2022-06-22 (×4): qty 1

## 2022-06-22 MED ORDER — ENSURE ENLIVE PO LIQD
237.0000 mL | Freq: Two times a day (BID) | ORAL | Status: DC
  Administered 2022-06-23 – 2022-06-24 (×3): 237 mL via ORAL

## 2022-06-22 MED ORDER — MIDODRINE HCL 5 MG PO TABS
5.0000 mg | ORAL_TABLET | Freq: Three times a day (TID) | ORAL | Status: DC
  Administered 2022-06-22 – 2022-06-23 (×3): 5 mg via ORAL
  Filled 2022-06-22 (×3): qty 1

## 2022-06-22 MED ORDER — POTASSIUM CHLORIDE CRYS ER 20 MEQ PO TBCR
40.0000 meq | EXTENDED_RELEASE_TABLET | Freq: Once | ORAL | Status: AC
  Administered 2022-06-22: 40 meq via ORAL
  Filled 2022-06-22: qty 2

## 2022-06-22 MED ORDER — FUROSEMIDE 40 MG PO TABS
40.0000 mg | ORAL_TABLET | Freq: Every day | ORAL | Status: DC
  Administered 2022-06-22: 40 mg via ORAL
  Filled 2022-06-22: qty 1

## 2022-06-22 NOTE — Plan of Care (Signed)
  Problem: Education: Goal: Knowledge of General Education information will improve Description: Including pain rating scale, medication(s)/side effects and non-pharmacologic comfort measures Outcome: Progressing   Problem: Health Behavior/Discharge Planning: Goal: Ability to manage health-related needs will improve Outcome: Progressing   Problem: Clinical Measurements: Goal: Respiratory complications will improve Outcome: Progressing   Problem: Activity: Goal: Risk for activity intolerance will decrease Outcome: Progressing   Problem: Elimination: Goal: Will not experience complications related to bowel motility Outcome: Progressing   Problem: Coping: Goal: Level of anxiety will decrease Outcome: Progressing

## 2022-06-22 NOTE — Progress Notes (Signed)
Physical Therapy Treatment Patient Details Name: Carol Hardin MRN: [id number] DOB: 10/30/1932 Today's Date: 06/22/2022   History of Present Illness 88 yo female admitted 9/10 with hypoxia and hypotension. PMhx: 9/4 fall in yard with visit to ED with nondisplaced sacral ala fx. HFrEF, Afib, SSS s/p PPM, chronic respiratory failure on nocturnal O2, HLD, GERD, depression    PT Comments    Patient progressing well towards PT goals. Session focused on progressive ambulation and activity tolerance. Pt reporting discomfort at her fx site and upset about things that have happened today. Requires min A for standing and Min guard assist for gait training with use of RW for support. Fatigues. Difficulty getting 02 reading due to poor pleth but no SOB noted during activity. Will continue to follow and progress as tolerated.   Recommendations for follow up therapy are one component of a multi-disciplinary discharge planning process, led by the attending physician.  Recommendations may be updated based on patient status, additional functional criteria and insurance authorization.  Follow Up Recommendations  Home health PT     Assistance Recommended at Discharge Intermittent Supervision/Assistance  Patient can return home with the following A little help with walking and/or transfers;A little help with bathing/dressing/bathroom;Assistance with cooking/housework;Direct supervision/assist for financial management;Assist for transportation;Help with stairs or ramp for entrance   Equipment Recommendations  None recommended by PT    Recommendations for Other Services       Precautions / Restrictions Precautions Precautions: Fall Restrictions Weight Bearing Restrictions: No     Mobility  Bed Mobility Overal bed mobility: Needs Assistance Bed Mobility: Supine to Sit, Sit to Supine     Supine to sit: Min guard, HOB elevated Sit to supine: Min guard, HOB elevated   General bed mobility  comments: HOB 20 degrees, rail and increased time with guarding to rise    Transfers Overall transfer level: Needs assistance Equipment used: Rolling walker (2 wheels) Transfers: Sit to/from Stand Sit to Stand: Min guard           General transfer comment: Min guard for safety from EOB x1, cues for hand placement as pt pulling up on RW.    Ambulation/Gait Ambulation/Gait assistance: Min guard Gait Distance (Feet): 120 Feet Assistive device: Rolling walker (2 wheels) Gait Pattern/deviations: Step-through pattern, Decreased stride length, Trunk flexed, Narrow base of support   Gait velocity interpretation: 1.31 - 2.62 ft/sec, indicative of limited community ambulator   General Gait Details: Slow, mostly steady gait with narrow BoS, cues for posture and proximity to RW. Poor pleth reading today but no SOB noted.   Stairs             Wheelchair Mobility    Modified Rankin (Stroke Patients Only)       Balance Overall balance assessment: Needs assistance, History of Falls Sitting-balance support: Feet supported, No upper extremity supported Sitting balance-Leahy Scale: Fair     Standing balance support: During functional activity Standing balance-Leahy Scale: Poor Standing balance comment: RW for gait, recent fall                            Cognition Arousal/Alertness: Awake/alert Behavior During Therapy: WFL for tasks assessed/performed Overall Cognitive Status: Impaired/Different from baseline Area of Impairment: Memory                     Memory: Decreased short-term memory         General Comments: does not recall therapist  coming to see her yesterday or nurse setting up her lunch tray. Bed is saturated in urine.        Exercises      General Comments General comments (skin integrity, edema, etc.): Unable to get a good 02 reading due to poor pleth, but no SOB noted with walking today.      Pertinent Vitals/Pain Pain  Assessment Pain Assessment: Faces Faces Pain Scale: Hurts little more Pain Location: R ribcage Pain Descriptors / Indicators: Aching, Guarding, Constant Pain Intervention(s): Monitored during session, Repositioned    Home Living                          Prior Function            PT Goals (current goals can now be found in the care plan section) Progress towards PT goals: Progressing toward goals    Frequency    Min 3X/week      PT Plan Current plan remains appropriate    Co-evaluation              AM-PAC PT "6 Clicks" Mobility   Outcome Measure  Help needed turning from your back to your side while in a flat bed without using bedrails?: A Little Help needed moving from lying on your back to sitting on the side of a flat bed without using bedrails?: A Little Help needed moving to and from a bed to a chair (including a wheelchair)?: A Little Help needed standing up from a chair using your arms (e.g., wheelchair or bedside chair)?: A Little Help needed to walk in hospital room?: A Little Help needed climbing 3-5 steps with a railing? : A Lot 6 Click Score: 17    End of Session Equipment Utilized During Treatment: Gait belt Activity Tolerance: Patient limited by fatigue;Patient tolerated treatment well Patient left: in bed;with call bell/phone within reach;with bed alarm set Nurse Communication: Mobility status;Other (comment) (full bed change needed) PT Visit Diagnosis: Other abnormalities of gait and mobility (R26.89);Difficulty in walking, not elsewhere classified (R26.2)     Time: 0962-8366 PT Time Calculation (min) (ACUTE ONLY): 20 min  Charges:  $Gait Training: 8-22 mins                     Marisa Severin, PT, DPT Acute Rehabilitation Services Secure chat preferred Office [place] 06/22/2022, 2:44 PM

## 2022-06-22 NOTE — Progress Notes (Signed)
Progress Note   Patient: Carol Hardin LOV:[id number] DOB: 27-Mar-1933 DOA: 06/20/2022     1 DOS: the patient was seen and examined on 06/22/2022   Brief hospital course: [name] was admitted to the hospital with the working diagnosis decompensated heart failure  88 yo female with the past medical history of heart failure, sinus node dysfunction sp bi chamber pacemaker, chronic hypoxemic respiratory failure who presented with hypotension and hypoxemia. Reported 2 to 3 weeks of lower extremity edema. Her diuretic therapy was modified as outpatient to 20 mg daily of furosemide and lisinopril was discontinued. Unfortunately her symptoms continue to worsen and her 02 saturation went down to 70%. She presented the ED for further evaluation. On her initial physical examination her blood pressure was 116/82, HR 68, RR 19 and 02 saturation 90% on room air. Lungs with bolateral rales and increased work of breathing, heart with S1 and S2 present, irregularly irregular with no gallops, abdomen with no distention, positive lower extremity edema.   Na 133, K 4,6 Cl 102 bicarbonate 22, glucose 103 bun 28 and cr 1,0  BNP 2,262  Wbc 6,8 hgb 11,6 plt 207  Sars covid 19 negative   Chest radiograph with cardiomegaly, bilateral hilar vascular congestion, with cephalization of the vasculature. Small bilateral pleural effusions. Pace maker in place with one atrial lead and biventricular leads.   EKG 91 bpm, left axis deviation, right bundle branch block, atrial fibrillation rhythm with intermittent ventricular pacing every 2 to every other intrinsic beat, no significant ST segment or T wave changes.   Patient was placed on diuresis for volume overload.  Patient has been considered terminal heart failure as outpatient, she has not tolerated well medical therapy.  Recommendations for palliative therapy.   09/12 patient with improvement in her volume status Placed on midodrine for blood pressure support and  transitioned to oral furosemide.   Assessment and Plan: * Acute on chronic systolic heart failure (HCC) Echocardiogram with reduced LV systolic function EF 88%, with global hypokinesis. Moderate dilatation of LV internal cavity, preserved RV systolic function. Mild to moderate MR.   Documented urine output is 500 cc.  Systolic blood pressure has been 80 to 90 mmHg  Plan to increase furosemide to change furosemide to 40 mg daily. Add midodrine for blood pressure support.    Hold on B blocker and RAS inhibition due to risk of hypotension.  Patient with very high risk for worsening Consult palliative care team.     Atrial fibrillation, chronic ([place]) Patient with intermittent ventricular pacing. Continue close telemetry monitoring.  Continue rate control with carvedilol and anticoagulation with apixaban.   Acute kidney injury superimposed on chronic kidney disease (HCC) CKD stage 3b. Hyponatremia, hypokalemia   Her volume status has improved, renal function with serum cr at 1,20 with K at 3,2 and serum bicarbonate at 27. Na 135  Add 40 meq Kcl for hypokalemia   Change furosemide to po and follow up renal function in am. Out of bed to chair tid as tolerated.   Malnutrition, calorie ([place]) Consult nutrition for recommendations Patient very deconditioned and poor oral intake.   Sacral fracture ([place]) 09/04 sacral alla fracture.   Her pain has improved with PRN oral oxycodone  Continue with IV morphine for severe pain Continue PT and OT.  Out of bed to chair as tolerated.         Subjective: Patient is feeling better, her back pain has improved, along with her dyspnea and edema  Physical Exam: Vitals:   06/22/22 0617 06/22/22 0731 06/22/22 0746 06/22/22 0949  BP: (!) 82/54 100/79 (!) 87/61 (!) 141/111  Pulse: 80 85 (!) 29 85  Resp: 18     Temp: 98 F (36.7 C)     TempSrc: Oral     SpO2: 97% 92% 93%   Weight:      Height:       Neurology awake and alert ENT  with mild pallor Cardiovascular with S1 and S2 present, positive extra beats, with no rubs or murmurs Positive moderate JVD Respiratory with mild rales but not wheezing Abdomen not distended  No lower extremity edema  Data Reviewed:    Family Communication: I spoke with patient's husband at the bedside, we talked in detail about patient's condition, plan of care and prognosis and all questions were addressed.   Disposition: Status is: Inpatient Remains inpatient appropriate because: heart failure and pain control   Planned Discharge Destination: Home  Author: Tawni Millers, MD 06/22/2022 10:18 AM  For on call review [email or web address].

## 2022-06-22 NOTE — Progress Notes (Signed)
Initial Nutrition Assessment  DOCUMENTATION CODES:   Severe malnutrition in context of chronic illness, Underweight  INTERVENTION:  Liberalize diet from a heart healthy to a regular diet to provide widest variety of menu options to enhance nutritional adequacy Ensure Enlive po BID, each supplement provides 350 kcal and 20 grams of protein. MVI with minerals daily  NUTRITION DIAGNOSIS:   Severe Malnutrition related to chronic illness (HF, sinus node dysfunction s/p pacemaker) as evidenced by severe fat depletion, severe muscle depletion.  GOAL:   Patient will meet greater than or equal to 90% of their needs  MONITOR:   PO intake, Supplement acceptance, Diet advancement, Labs, Weight trends  REASON FOR ASSESSMENT:   Consult Assessment of nutrition requirement/status  ASSESSMENT:   Pt admitted with decompensated HF. PMH significant for HF, sinus node dysfunction s/p bichamber pacemaker, chronic hypoxemic respiratory failure.  Recommended for palliative therapy outpatient for terminal HF. Noted improvements in her volume status. Transitioned to oral furosemide.   Pt was drowsy at time of visit. She reports feeling very sleepy today. Her reported nutrition history was conflicting in information as she reports that her PO intake has decreased since her pacemaker placement in 2020 however she also denies decreases in PO intake and eating at her baseline.   Unfortunately there are no documented meal completions on file. Pt does report having a good appetite. Observed open Ensure on bedside table with minimal sips taken. She reports drinking 1 daily at home and is agreeable to continue to receive them during admission.   Reviewed weight history. Within the last year her weight has primarily fluctuated between 52-54 kg. Her current admit weight is documented at 41 kg. Suspect that her recent weight change is r/t diureses and volume status.   Given her severe malnutrition, low BMI, and  likely inadequate PO intake, she would benefit from a liberalized diet to optimize her nutritional intake.   Medications: lasix, klor-con  Labs: potassium 3.2, Cr 1.20, GFR 44  NUTRITION - FOCUSED PHYSICAL EXAM:  Flowsheet Row Most Recent Value  Orbital Region Severe depletion  Upper Arm Region Severe depletion  Thoracic and Lumbar Region Severe depletion  Buccal Region Moderate depletion  Temple Region Moderate depletion  Clavicle Bone Region Severe depletion  Clavicle and Acromion Bone Region Severe depletion  Scapular Bone Region Severe depletion  Dorsal Hand Moderate depletion  Patellar Region Severe depletion  Anterior Thigh Region Severe depletion  Posterior Calf Region Severe depletion  Edema (RD Assessment) None  Hair Reviewed  Eyes Reviewed  Mouth Reviewed  Skin Reviewed  Nails Reviewed       Diet Order:   Diet Order             Diet regular Room service appropriate? Yes; Fluid consistency: Thin; Fluid restriction: 1800 mL Fluid  Diet effective now                  EDUCATION NEEDS:   Not appropriate for education at this time  Skin:  Skin Assessment: Reviewed RN Assessment  Last BM:  9/9  Height:   Ht Readings from Last 1 Encounters:  06/20/22 '5\' 3"'$  (1.6 m)    Weight:   Wt Readings from Last 1 Encounters:  06/22/22 41 kg   BMI:  Body mass index is 16.01 kg/m.  Estimated Nutritional Needs:   Kcal:  1200-1400  Protein:  60-75g  Fluid:  >/=1.5L  Clayborne Dana, RDN, LDN Clinical Nutrition

## 2022-06-22 NOTE — Progress Notes (Signed)
Per CCMD patent had 5 beat run of NSVT. On assessment of patent patent asleep but easy rouse, patent denies any chest pain or pressure BP (!) 82/54 (BP Location: Left Arm)   Pulse 80   Temp 98 F (36.7 C) (Oral)   Resp 18   Ht '5\' 3"'$  (1.6 m)   Wt 41 kg   SpO2 97%   BMI 16.01 kg/m

## 2022-06-23 DIAGNOSIS — E43 Unspecified severe protein-calorie malnutrition: Secondary | ICD-10-CM | POA: Insufficient documentation

## 2022-06-23 DIAGNOSIS — I5023 Acute on chronic systolic (congestive) heart failure: Secondary | ICD-10-CM | POA: Diagnosis not present

## 2022-06-23 LAB — BASIC METABOLIC PANEL
Anion gap: 5 (ref 5–15)
BUN: 21 mg/dL (ref 8–23)
CO2: 29 mmol/L (ref 22–32)
Calcium: 8.3 mg/dL — ABNORMAL LOW (ref 8.9–10.3)
Chloride: 102 mmol/L (ref 98–111)
Creatinine, Ser: 0.95 mg/dL (ref 0.44–1.00)
GFR, Estimated: 58 mL/min — ABNORMAL LOW (ref >60)
Glucose, Bld: 82 mg/dL (ref 70–99)
Potassium: 3.5 mmol/L (ref 3.5–5.1)
Sodium: 136 mmol/L (ref 135–145)

## 2022-06-23 MED ORDER — POTASSIUM CHLORIDE CRYS ER 20 MEQ PO TBCR
40.0000 meq | EXTENDED_RELEASE_TABLET | Freq: Once | ORAL | Status: AC
  Administered 2022-06-23: 40 meq via ORAL
  Filled 2022-06-23: qty 2

## 2022-06-23 MED ORDER — POTASSIUM CHLORIDE CRYS ER 20 MEQ PO TBCR
40.0000 meq | EXTENDED_RELEASE_TABLET | Freq: Once | ORAL | Status: AC
Start: 2022-06-23 — End: 2022-06-23
  Administered 2022-06-23: 40 meq via ORAL
  Filled 2022-06-23: qty 2

## 2022-06-23 MED ORDER — FUROSEMIDE 10 MG/ML IJ SOLN
20.0000 mg | Freq: Two times a day (BID) | INTRAMUSCULAR | Status: DC
  Administered 2022-06-23 – 2022-06-25 (×5): 20 mg via INTRAVENOUS
  Filled 2022-06-23 (×5): qty 2

## 2022-06-23 MED ORDER — LORAZEPAM 0.5 MG PO TABS
0.5000 mg | ORAL_TABLET | Freq: Every day | ORAL | Status: DC
  Administered 2022-06-23 – 2022-06-24 (×2): 0.5 mg via ORAL
  Filled 2022-06-23 (×2): qty 1

## 2022-06-23 MED ORDER — SENNOSIDES-DOCUSATE SODIUM 8.6-50 MG PO TABS
1.0000 | ORAL_TABLET | Freq: Two times a day (BID) | ORAL | Status: DC
  Administered 2022-06-23 – 2022-06-25 (×6): 1 via ORAL
  Filled 2022-06-23 (×6): qty 1

## 2022-06-23 MED ORDER — MIDODRINE HCL 5 MG PO TABS
5.0000 mg | ORAL_TABLET | Freq: Once | ORAL | Status: AC
  Administered 2022-06-23: 5 mg via ORAL
  Filled 2022-06-23: qty 1

## 2022-06-23 MED ORDER — MIDODRINE HCL 5 MG PO TABS
10.0000 mg | ORAL_TABLET | Freq: Two times a day (BID) | ORAL | Status: DC
  Administered 2022-06-23 – 2022-06-26 (×6): 10 mg via ORAL
  Filled 2022-06-23 (×6): qty 2

## 2022-06-23 NOTE — Progress Notes (Addendum)
PROGRESS NOTE    Carol Hardin  AVW:[id number] DOB: [birth year] DOA: 06/20/2022 PCP: Celene Squibb, MD   Carol Hardin is a 88 y.o. female with medical history significant of chronic HFrEF EF 25%, MR, chronic A-fib, SSS s/p PPM, chronic hypoxic respiratory failure on at bedtime oxygen at home, presented with persistent hypotension and worsening of hypoxia. Patient started to have bilateral lower extremity swelling 2 to 3 weeks ago, went to see cardiology, and her CHF medication adjusted.  lisinopril was discontinued and patient was started on p.o. Lasix 20 mg daily.   Overnight, family took her pulse ox which was 70s.  In ED, BP soft, Afib w/ mild tachycardia, CXr w/ pulm vasc congestion, b/l pleural effusions, admitted   Subjective: -Tired, hurting all over, dyspnea unchanged  Assessment and Plan: * Acute on chronic systolic heart failure (HCC) Echocardiogram with reduced LV systolic function EF 82-95%, with global hypokinesis. Moderate dilatation of LV internal cavity, preserved RV systolic function. Mild to moderate MR.  -Poor response to diuretics thus far, also limited by hypotension, history of longstanding orthostatic hypotension as well -Doubt she is a candidate for advanced therapies/inotropes will request cardiology input, anticipate need for palliative care discussions -Restart IV Lasix this afternoon if blood pressure stabilizes -Hold Coreg, started on midodrine, will increase dose -Discussed code status, DNR she will think about this  Atrial fibrillation, chronic ([place]) Patient with intermittent ventricular pacing. -Holding carvedilol with hypotension this morning, continue apixaban  Chronic hypoxic respiratory failure  -On home O2 2 L at baseline at bedtime  Acute kidney injury superimposed on chronic kidney disease ([place]) CKD stage 3b. Hyponatremia, hypokalemia  -Resolved  Moderate protein calorie malnutrition -Continue supplements as tolerated  Sacral fracture  (HCC) 09/04 sacral ala fracture.  -DC morphine, continue IV oxycodone, add laxatives  DVT prophylaxis: Apixaban Code Status: Full code, she will think about DNR Family Communication: Discussed with spouse and kids at bedside Disposition Plan: To be determined  Consultants: Cardiology   Procedures:   Antimicrobials:    Objective: Vitals:   06/23/22 0406 06/23/22 0741 06/23/22 0800 06/23/22 1126  BP: 91/63 100/64 (!) 110/98 97/64  Pulse: 96 85 66 63  Resp: '16  17 18  '$ Temp: 97.8 F (36.6 C)  (!) 97.5 F (36.4 C) (!) 97.4 F (36.3 C)  TempSrc: Oral  Oral Oral  SpO2: 97% 97% 95% 98%  Weight: 53.5 kg     Height:        Intake/Output Summary (Last 24 hours) at 06/23/2022 1455 Last data filed at 06/23/2022 1001 Gross per 24 hour  Intake 1077 ml  Output 250 ml  Net 827 ml   Filed Weights   06/21/22 0541 06/22/22 0500 06/23/22 0406  Weight: 41.8 kg 41 kg 53.5 kg    Examination:  General exam: Frail chronically ill elderly female sitting up in bed, AAOx3 Respiratory system: Bilateral Rales Cardiovascular system: S1 & S2 heard, RRR.  Systolic murmur Abd: nondistended, soft and nontender.Normal bowel sounds heard. Central nervous system: Alert and oriented. No focal neurological deficits. Extremities: no edema Skin: No rashes Psychiatry: Flat affect    Data Reviewed:   CBC: Recent Labs  Lab 06/20/22 1053  WBC 6.8  HGB 11.6*  HCT 35.7*  MCV 104.4*  PLT 621   Basic Metabolic Panel: Recent Labs  Lab 06/20/22 1053 06/21/22 0431 06/22/22 0513 06/23/22 0504  NA 133* 131* 135 136  K 4.6 3.7 3.2* 3.5  CL 102 100 101 102  CO2  $'22 22 27 29  'd$ GLUCOSE 103* 94 89 82  BUN 28* 31* 22 21  CREATININE 1.01* 1.33* 1.20* 0.95  CALCIUM 9.0 8.4* 8.2* 8.3*   GFR: Estimated Creatinine Clearance: 35.2 mL/min (by C-G formula based on SCr of 0.95 mg/dL). Liver Function Tests: Recent Labs  Lab 06/20/22 1053  AST 47*  ALT 31  ALKPHOS 58  BILITOT 1.4*  PROT 7.9   ALBUMIN 2.9*   No results for input(s): "LIPASE", "AMYLASE" in the last 168 hours. No results for input(s): "AMMONIA" in the last 168 hours. Coagulation Profile: No results for input(s): "INR", "PROTIME" in the last 168 hours. Cardiac Enzymes: No results for input(s): "CKTOTAL", "CKMB", "CKMBINDEX", "TROPONINI" in the last 168 hours. BNP (last 3 results) No results for input(s): "PROBNP" in the last 8760 hours. HbA1C: No results for input(s): "HGBA1C" in the last 72 hours. CBG: No results for input(s): "GLUCAP" in the last 168 hours. Lipid Profile: No results for input(s): "CHOL", "HDL", "LDLCALC", "TRIG", "CHOLHDL", "LDLDIRECT" in the last 72 hours. Thyroid Function Tests: No results for input(s): "TSH", "T4TOTAL", "FREET4", "T3FREE", "THYROIDAB" in the last 72 hours. Anemia Panel: No results for input(s): "VITAMINB12", "FOLATE", "FERRITIN", "TIBC", "IRON", "RETICCTPCT" in the last 72 hours. Urine analysis:    Component Value Date/Time   COLORURINE YELLOW 08/20/2019 2047   APPEARANCEUR CLEAR 08/20/2019 2047   LABSPEC >1.046 (H) 08/20/2019 2047   PHURINE 6.0 08/20/2019 2047   GLUCOSEU NEGATIVE 08/20/2019 2047   HGBUR SMALL (A) 08/20/2019 2047   BILIRUBINUR negative 04/18/2021 1542   KETONESUR negative 04/18/2021 1542   KETONESUR 5 (A) 08/20/2019 2047   PROTEINUR >=300 (A) 04/18/2021 1542   PROTEINUR NEGATIVE 08/20/2019 2047   UROBILINOGEN 1.0 04/18/2021 1542   UROBILINOGEN 0.2 08/16/2008 2359   NITRITE Positive (A) 04/18/2021 1542   NITRITE NEGATIVE 08/20/2019 2047   LEUKOCYTESUR Small (1+) (A) 04/18/2021 1542   LEUKOCYTESUR NEGATIVE 08/20/2019 2047   Sepsis Labs: '@LABRCNTIP'$ (procalcitonin:4,lacticidven:4)  ) Recent Results (from the past 240 hour(s))  SARS Coronavirus 2 by RT PCR (hospital order, performed in [place] hospital lab) *cepheid single result test* Anterior Nasal Swab     Status: None   Collection Time: 06/20/22 12:38 PM   Specimen: Anterior Nasal Swab   Result Value Ref Range Status   SARS Coronavirus 2 by RT PCR NEGATIVE NEGATIVE Final    Comment: (NOTE) SARS-CoV-2 target nucleic acids are NOT DETECTED.  The SARS-CoV-2 RNA is generally detectable in upper and lower respiratory specimens during the acute phase of infection. The lowest concentration of SARS-CoV-2 viral copies this assay can detect is 250 copies / mL. A negative result does not preclude SARS-CoV-2 infection and should not be used as the sole basis for treatment or other patient management decisions.  A negative result may occur with improper specimen collection / handling, submission of specimen other than nasopharyngeal swab, presence of viral mutation(s) within the areas targeted by this assay, and inadequate number of viral copies (<250 copies / mL). A negative result must be combined with clinical observations, patient history, and epidemiological information.  Fact Sheet for Patients:   https://www.patel.info/  Fact Sheet for Healthcare Providers: https://hall.com/  This test is not yet approved or  cleared by the [place] FDA and has been authorized for detection and/or diagnosis of SARS-CoV-2 by FDA under an Emergency Use Authorization (EUA).  This EUA will remain in effect (meaning this test can be used) for the duration of the COVID-19 declaration under Section 564(b)(1) of the Act,  21 U.S.C. section 360bbb-3(b)(1), unless the authorization is terminated or revoked sooner.  Performed at [facility], [place] [address] [place], [place] [address]      Radiology Studies: No results found.   Scheduled Meds:  acetaminophen  325-975 mg Oral Daily   apixaban  2.5 mg Oral BID   feeding supplement  237 mL Oral BID BM   LORazepam  1 mg Oral QHS   midodrine  10 mg Oral BID WC   multivitamin with minerals  1 tablet Oral Daily   senna-docusate  1 tablet Oral BID   sodium chloride flush  3 mL Intravenous  Q12H   Continuous Infusions:  sodium chloride       LOS: 2 days    Time spent: 43mn  PDomenic Polite MD [facility]   06/23/2022, 2:55 PM

## 2022-06-23 NOTE — Plan of Care (Signed)
  Problem: Education: Goal: Knowledge of General Education information will improve Description: Including pain rating scale, medication(s)/side effects and non-pharmacologic comfort measures Outcome: Progressing   Problem: Health Behavior/Discharge Planning: Goal: Ability to manage health-related needs will improve Outcome: Progressing   Problem: Activity: Goal: Risk for activity intolerance will decrease Outcome: Progressing   Problem: Nutrition: Goal: Adequate nutrition will be maintained Outcome: Progressing   Problem: Elimination: Goal: Will not experience complications related to bowel motility Outcome: Progressing   Problem: Pain Managment: Goal: General experience of comfort will improve Outcome: Progressing   Problem: Coping: Goal: Level of anxiety will decrease Outcome: Progressing

## 2022-06-23 NOTE — Progress Notes (Signed)
Occupational Therapy Treatment Patient Details Name: Carol Hardin MRN: [id number] DOB: 1933/09/06 Today's Date: 06/23/2022   History of present illness 88 yo female admitted 9/10 with hypoxia and hypotension. PMhx: 9/4 fall in yard with visit to ED with nondisplaced sacral ala fx. HFrEF, Afib, SSS s/p PPM, chronic respiratory failure on nocturnal O2, HLD, GERD, depression   OT comments  Pt progressing towards goals, able to complete seated/standing ADLs with min guard-minA, min guard for bed mobility and transfers with RW. Pt VSS on supplemental O2 during session. Pt presenting with impairments listed below, will follow acutely. Continue to recommend HHOT at d/c.   Recommendations for follow up therapy are one component of a multi-disciplinary discharge planning process, led by the attending physician.  Recommendations may be updated based on patient status, additional functional criteria and insurance authorization.    Follow Up Recommendations  Home health OT    Assistance Recommended at Discharge Intermittent Supervision/Assistance  Patient can return home with the following  A little help with walking and/or transfers;A lot of help with bathing/dressing/bathroom;Assistance with cooking/housework;Direct supervision/assist for medications management;Direct supervision/assist for financial management;Assist for transportation;Help with stairs or ramp for entrance   Equipment Recommendations  None recommended by OT (pt has all needed DME)    Recommendations for Other Services PT consult    Precautions / Restrictions Precautions Precautions: Fall Restrictions Weight Bearing Restrictions: No       Mobility Bed Mobility Overal bed mobility: Needs Assistance Bed Mobility: Supine to Sit, Sit to Supine     Supine to sit: Min guard, HOB elevated Sit to supine: Min guard, HOB elevated        Transfers Overall transfer level: Needs assistance Equipment used: Rolling walker  (2 wheels) Transfers: Sit to/from Stand Sit to Stand: Min guard                 Balance Overall balance assessment: Needs assistance, History of Falls Sitting-balance support: Feet supported, No upper extremity supported Sitting balance-Leahy Scale: Good Sitting balance - Comments: can reach towards feet to pull up underwear without LOB   Standing balance support: During functional activity, Reliant on assistive device for balance Standing balance-Leahy Scale: Poor Standing balance comment: RW for gait, recent fall                           ADL either performed or assessed with clinical judgement   ADL Overall ADL's : Needs assistance/impaired     Grooming: Min guard;Standing;Wash/dry hands Grooming Details (indicate cue type and reason): standing at sink     Lower Body Bathing: Min guard;Sitting/lateral leans Lower Body Bathing Details (indicate cue type and reason): washing legs seated on BSC     Lower Body Dressing: Minimal assistance;Sit to/from stand;Sitting/lateral leans Lower Body Dressing Details (indicate cue type and reason): donning/doffing mesh underwear Toilet Transfer: Min guard;Rolling walker (2 wheels);Ambulation;BSC/3in1   Toileting- Water quality scientist and Hygiene: Min guard;Sit to/from stand Toileting - Clothing Manipulation Details (indicate cue type and reason): pericare     Functional mobility during ADLs: Rolling walker (2 wheels);Min guard      Extremity/Trunk Assessment Upper Extremity Assessment Upper Extremity Assessment: Generalized weakness   Lower Extremity Assessment Lower Extremity Assessment: Defer to PT evaluation        Vision   Vision Assessment?: No apparent visual deficits   Perception Perception Perception: Not tested   Praxis Praxis Praxis: Not tested    Cognition Arousal/Alertness: Awake/alert Behavior During Therapy:  WFL for tasks assessed/performed Overall Cognitive Status: Impaired/Different  from baseline Area of Impairment: Memory                     Memory: Decreased short-term memory                  Exercises      Shoulder Instructions       General Comments VSS on supplemental O2    Pertinent Vitals/ Pain       Pain Assessment Pain Assessment: No/denies pain Pain Location: back/leg with mobility Pain Descriptors / Indicators: Discomfort Pain Intervention(s): Monitored during session  Home Living                                          Prior Functioning/Environment              Frequency  Min 2X/week        Progress Toward Goals  OT Goals(current goals can now be found in the care plan section)  Progress towards OT goals: Progressing toward goals  Acute Rehab OT Goals Patient Stated Goal: none stated OT Goal Formulation: With patient Time For Goal Achievement: 07/05/22 Potential to Achieve Goals: Good ADL Goals Pt Will Perform Lower Body Bathing: Independently;with adaptive equipment;sitting/lateral leans;sit to/from stand Pt Will Perform Lower Body Dressing: Independently;with adaptive equipment;sitting/lateral leans;sit to/from stand Pt Will Transfer to Toilet: Independently;ambulating Pt Will Perform Toileting - Clothing Manipulation and hygiene: Independently;sit to/from stand;sitting/lateral leans Additional ADL Goal #1: Patient will be able to verbalize 3 strategies to prevent increased edema from a CHF exacerbation to decrease hospital admissions. Additional ADL Goal #2: Patient will able to complete functional task in standing for 3-5 minutes without seated rest break to promote increased activity tolerance.  Plan Discharge plan remains appropriate;Frequency remains appropriate    Co-evaluation                 AM-PAC OT "6 Clicks" Daily Activity     Outcome Measure   Help from another person eating meals?: A Little Help from another person taking care of personal grooming?: A Little Help  from another person toileting, which includes using toliet, bedpan, or urinal?: A Little Help from another person bathing (including washing, rinsing, drying)?: A Little Help from another person to put on and taking off regular upper body clothing?: A Little Help from another person to put on and taking off regular lower body clothing?: A Little 6 Click Score: 18    End of Session Equipment Utilized During Treatment: Rolling walker (2 wheels);Oxygen  OT Visit Diagnosis: Unsteadiness on feet (R26.81);Other abnormalities of gait and mobility (R26.89);Muscle weakness (generalized) (M62.81);History of falling (Z91.81);Pain Pain - Right/Left: Right   Activity Tolerance Patient tolerated treatment well   Patient Left in bed;with call bell/phone within reach;with bed alarm set   Nurse Communication Mobility status        Time: 0626-9485 OT Time Calculation (min): 32 min  Charges: OT General Charges $OT Visit: 1 Visit OT Treatments $Self Care/Home Management : 23-37 mins  Lynnda Child, OTD, OTR/L Acute Rehab [phone]) [phone] [place] 06/23/2022, 4:46 PM

## 2022-06-23 NOTE — Progress Notes (Signed)
   06/23/22 1130  Mobility  Activity Ambulated with assistance in hallway  Level of Assistance Minimal assist, patient does 75% or more  Assistive Device Front wheel walker  Distance Ambulated (ft) 150 ft  Activity Response Tolerated well  $Mobility charge 1 Mobility   Mobility Specialist Progress Note  Pt was in bed and agreeable. Mobility cut short d/t c/o back pain. Returned to bed w/ alarm on and all needs met.   Carol Hardin Mobility Specialist

## 2022-06-23 NOTE — Consult Note (Addendum)
Cardiology Consultation   Patient ID: [name] MRN: [id number]; DOB: 1933-04-16  Admit date: 06/20/2022 Date of Consult: 06/23/2022  PCP:  Celene Squibb, [place] [facility] Cardiologist:  Carlyle Dolly, MD  Electrophysiologist:  Thompson Grayer, MD  {  Patient Profile:   Carol Hardin is a 88 y.o. female with a hx of chronic heart failure with reduced ejection fraction (25%), mild to moderate mitral regurg, sick sinus syndrome, tachy-brady syndrome, permanent afib, status post AVN ablation with CRT-P (2020), chronic hypoxic respiratory failure on bedtime oxygen, CVA (2019) at home who is being seen 06/23/2022 for the evaluation of acute heart failure with reduced ejection fraction at the request of Dr. Broadus John.  History of Present Illness:   Carol Hardin presented to [facility] ED on 9/10 in the setting of low blood pressure and worsening hypoxia/shortness of breath. The night proceeding admission, patient was found with O2 saturation in the 70s and was taken to the ED by family. In the ED, patient found with borderline low BP. Chest x-ray showed pulmonary congestion.  Chart review shows that patient reported worsening shortness of breath, lower extremity edema and orthostatic dizziness starting at the end of August and was seen by [name] in clinic. Patient attributed her symptoms to stopping Depakote (prescribed for headache). Patient reported shortness of breath at rest and endorsed orthopnea. Per patient, home blood pressures had recently been in the 37J and 69C systolic. Per clinic note, patient was orthostatic that day when positional BP was checked. Patient was sent to the ED that day from clinic for further evaluation/management. Patient went to [name] and was given fluid bolus and lisinopril was discontinued by ED provider. In the following days, it appears patient may have received contrasting instructions from cardiology DOD and her PCP regarding  lasix. Ultimately patient was advised that lasix was not needed if she was not gaining weight.  Patient reports that she continued to have problems with low BP and a 9/2 telephone encounter shows patient reporting "very short of breath, like she can't get enough air in." On 9/4, patient was taken to [facility] by EMS after a fall at home and was found with a hairline sacral fracture. Patient was discharged the same day but returned on 9/6 with worsening pain.   Today on my exam, patient reports that her breathing seems to be a bit improved. "I guess it is, I'm not thinking about it as much." She denies chest pain, palpitations, lower extremity edema. Patient is generally frustrated with her cardiac care over the years. I spent a significant amount of time in patients room discussing her concerns and reassuring her of our intent to provide comprehensive and compassionate cardiac care.   Past Medical History:  Diagnosis Date   Anxiety    Arthritis    "fingers, right toe" (08/09/2016)   Atrial fibrillation (HCC)    Basal cell carcinoma of lower leg, right    Bradycardia    a. Holter 08/30/16 showed profound bradycardia down to 30 during awake hours, several 2 second pauses, very frequent PVCs >8000 in 48 hours, and NSVT (longest of 14 beats).   Cardiomyopathy ([place])    a. EF 40% by echo 08/2016.   Daily headache    "I usually wake up w/a headache; sometimes it's a migraine" (08/09/2016)   Depression    Diverticulosis    Hx. of   Frequent PVCs    GERD (gastroesophageal reflux disease)  Hypercholesterolemia    Migraine    Mitral regurgitation    MVP (mitral valve prolapse)    NSVT (nonsustained ventricular tachycardia) ([place])    a. first noted event monitor 08/2016.   Osteoporosis    PAF (paroxysmal atrial fibrillation) ([place])    a. in afib at time of echo 08/2016.   Pneumonia    Scoliosis    mild   Squamous cell carcinoma of neck    Stroke ([place]) 05/2018    Past Surgical History:   Procedure Laterality Date   AV NODE ABLATION N/A 01/23/2019   Procedure: AV NODE ABLATION;  Surgeon: Thompson Grayer, MD;  Location: [facility];  Service: Cardiovascular;  Laterality: N/A;   BASAL CELL CARCINOMA EXCISION Right 1980s   RLE   BIV PACEMAKER INSERTION CRT-P N/A 01/23/2019   Procedure: BIV PACEMAKER INSERTION CRT-P;  Surgeon: Thompson Grayer, MD;  Location: [facility];  Service: Cardiovascular;  Laterality: N/A;   BLEPHAROPLASTY     BREAST BIOPSY Left 1970s   BREAST CYST ASPIRATION Left 1970s   BREAST CYST EXCISION Left 1970s   BUNIONECTOMY Bilateral    CATARACT EXTRACTION W/PHACO Right 04/05/2022   Procedure: CATARACT EXTRACTION PHACO AND INTRAOCULAR LENS PLACEMENT (IOC);  Surgeon: Baruch Goldmann, MD;  Location: [facility];  Service: Ophthalmology;  Laterality: Right;  CDE 11.93   DILATION AND CURETTAGE OF UTERUS     EXCISIONAL HEMORRHOIDECTOMY  1970s   INTRAMEDULLARY (IM) NAIL INTERTROCHANTERIC Left 12/31/2017   Procedure: INTRAMEDULLARY (IM) NAIL INTERTROCHANTRIC;  Surgeon: Nicholes Stairs, MD;  Location: [facility];  Service: Orthopedics;  Laterality: Left;   KNEE ARTHROSCOPY Right 04/12/2007   KNEE ARTHROSCOPY Left 1998   SQUAMOUS CELL CARCINOMA EXCISION     "neck"   TONSILLECTOMY       Home Medications:  Prior to Admission medications   Medication Sig Start Date End Date Taking? Authorizing Provider  acetaminophen (TYLENOL) 325 MG tablet Take 325-975 mg by mouth daily.   Yes [provider]  albuterol (VENTOLIN HFA) 108 (90 Base) MCG/ACT inhaler Inhale 2 puffs into the lungs every 6 (six) hours as needed for wheezing or shortness of breath. 04/29/22  Yes Byrum, Rose Fillers, MD  apixaban (ELIQUIS) 2.5 MG TABS tablet TAKE 1 TABLET(2.5 MG) BY MOUTH TWICE DAILY 04/05/22  Yes O'Neal, Cassie Freer, MD  azelastine (ASTELIN) 0.1 % nasal spray Place 2 sprays into both nostrils 2 (two) times daily as needed for rhinitis. 05/28/22  Yes Valentina Shaggy, MD   carvedilol (COREG) 3.125 MG tablet Take 1 tablet (3.125 mg total) by mouth 2 (two) times daily with a meal. 03/31/22  Yes O'Neal, Cassie Freer, MD  Cholecalciferol (VITAMIN D-3) 25 MCG (1000 UT) CAPS Take 1 capsule by mouth daily.   Yes [provider]  Ensure (ENSURE) Take 237 mLs by mouth daily.   Yes [provider]  furosemide (LASIX) 20 MG tablet Take 20 mg by mouth as needed for fluid.   Yes [provider]  lisinopril (ZESTRIL) 2.5 MG tablet Take 2.5 mg by mouth daily.   Yes [provider]  LORazepam (ATIVAN) 1 MG tablet Take 1 mg by mouth at bedtime.   Yes [provider]  Polyethyl Glycol-Propyl Glycol (SYSTANE OP) Apply to eye as needed.   Yes [provider]  Vibegron (GEMTESA) 75 MG TABS Take 75 mg by mouth daily.   Yes [provider]  divalproex (DEPAKOTE ER) 250 MG 24 hr tablet Take 1 tablet (250 mg  total) by mouth daily. Patient not taking: Reported on 06/04/2022 04/30/22   Pieter Partridge, DO  Fluocinolone Acetonide Scalp 0.01 % OIL Apply 1 Application topically 2 (two) times daily as needed. Patient not taking: Reported on 06/04/2022 05/23/22   Dara Hoyer, FNP  HYDROcodone-acetaminophen (NORCO/VICODIN) 5-325 MG tablet Take 1 tablet by mouth every 4 (four) hours as needed for severe pain. Patient not taking: Reported on 06/20/2022 06/16/22   Evlyn Courier, PA-C    Inpatient Medications: Scheduled Meds:  acetaminophen  325-975 mg Oral Daily   apixaban  2.5 mg Oral BID   feeding supplement  237 mL Oral BID BM   furosemide  20 mg Intravenous BID   LORazepam  0.5 mg Oral QHS   midodrine  10 mg Oral BID WC   multivitamin with minerals  1 tablet Oral Daily   potassium chloride  40 mEq Oral Once   senna-docusate  1 tablet Oral BID   sodium chloride flush  3 mL Intravenous Q12H   Continuous Infusions:  sodium chloride     PRN Meds: sodium chloride, acetaminophen, albuterol, azelastine, ondansetron (ZOFRAN) IV,  oxyCODONE, sodium chloride flush  Allergies:    Allergies  Allergen Reactions   Omeprazole Other (See Comments)    siezure   Flagyl [Metronidazole] Nausea And Vomiting   Amiodarone    Aripiprazole Other (See Comments)    Unknown reaction   Clindamycin/Lincomycin Other (See Comments)    Bacterial infection / burning in throat   Gabapentin Itching   Hylan G-F 20 Other (See Comments)    Unknown reaction   Metoprolol Tartrate    Paroxetine    Paroxetine Hcl Other (See Comments)    Headaches (a long time ago- pt doesn't really remember)   Statins Other (See Comments)    No specific reaction given-patient states that she was advised not to take by physician   Sulfa Antibiotics Diarrhea and Other (See Comments)    Colitis    Toprol Xl [Metoprolol Succinate] Other (See Comments)    Dizziness--pt doesn't really remember    Vancomycin Itching and Other (See Comments)    Pt reports that they gave it too fast- she started having itching in the scalp   Penicillins Rash    DID THE REACTION INVOLVE: Swelling of the face/tongue/throat, SOB, or low BP? No Sudden or severe rash/hives, skin peeling, or the inside of the mouth or nose? Yes Did it require medical treatment? Unknown When did it last happen?  Over 10 years     If all above answers are "NO", may proceed with cephalosporin use.     Social History:   Social History   Socioeconomic History   Marital status: Married    Spouse name: Not on file   Number of children: 4   Years of education: 2.5 yrs of college   Highest education level: Not on file  Occupational History   Not on file  Tobacco Use   Smoking status: Former    Packs/day: 1.00    Years: 20.00    Total pack years: 20.00    Types: Cigarettes    Quit date: 1989    Years since quitting: 34.7   Smokeless tobacco: Never  Vaping Use   Vaping Use: Never used  Substance and Sexual Activity   Alcohol use: No    Comment: quit 1995, was occasional    Drug use: Never    Sexual activity: Not Currently  Other Topics Concern   Not on file  Social History Narrative   Lives at home with spouse   Caffeine: 2 cups coffee/day   Right handed   Social Determinants of Health   Financial Resource Strain: Medium Risk (08/17/2017)   Overall Financial Resource Strain (CARDIA)    Difficulty of Paying Living Expenses: Somewhat hard  Food Insecurity: No Food Insecurity (06/21/2022)   Hunger Vital Sign    Worried About Running Out of Food in the Last Year: Never true    Ran Out of Food in the Last Year: Never true  Transportation Needs: No Transportation Needs (06/21/2022)   PRAPARE - Hydrologist (Medical): No    Lack of Transportation (Non-Medical): No  Physical Activity: Inactive (08/17/2017)   Exercise Vital Sign    Days of Exercise per Week: 0 days    Minutes of Exercise per Session: 0 min  Stress: Stress Concern Present (08/17/2017)   [place]    Feeling of Stress : Rather much  Social Connections: Moderately Isolated (08/17/2017)   Social Connection and Isolation Panel [NHANES]    Frequency of Communication with Friends and Family: Once a week    Frequency of Social Gatherings with Friends and Family: Once a week    Attends Religious Services: Never    Marine scientist or Organizations: No    Attends Archivist Meetings: Never    Marital Status: Married  Human resources officer Violence: Not At Risk (06/21/2022)   Humiliation, Afraid, Rape, and Kick questionnaire    Fear of Current or Ex-Partner: No    Emotionally Abused: No    Physically Abused: No    Sexually Abused: No    Family History:    Family History  Problem Relation Age of Onset   Heart failure Mother    Leukemia Mother    Heart failure Father    COPD Father    Alzheimer's disease Father    Alzheimer's disease Brother    Heart Problems Brother        stent   Heart Problems Son         stent    Allergic rhinitis Neg Hx    Angioedema Neg Hx    Asthma Neg Hx    Eczema Neg Hx    Atopy Neg Hx    Immunodeficiency Neg Hx    Urticaria Neg Hx    Headache Neg Hx      ROS:  Please see the history of present illness.   All other ROS reviewed and negative.     Physical Exam/Data:   Vitals:   06/23/22 0406 06/23/22 0741 06/23/22 0800 06/23/22 1126  BP: 91/63 100/64 (!) 110/98 97/64  Pulse: 96 85 66 63  Resp: '16  17 18  '$ Temp: 97.8 F (36.6 C)  (!) 97.5 F (36.4 C) (!) 97.4 F (36.3 C)  TempSrc: Oral  Oral Oral  SpO2: 97% 97% 95% 98%  Weight: 53.5 kg     Height:        Intake/Output Summary (Last 24 hours) at 06/23/2022 1523 Last data filed at 06/23/2022 1001 Gross per 24 hour  Intake 1077 ml  Output 250 ml  Net 827 ml      06/23/2022    4:06 AM 06/22/2022    5:00 AM 06/21/2022    5:41 AM  Last 3 Weights  Weight (lbs) 117 lb 14.4 oz 90 lb 6.2 oz 92 lb 1.6 oz  Weight (kg) 53.479 kg 41  kg 41.776 kg     Body mass index is 20.89 kg/m.  General:  Malnourished appearing, in no acute distress HEENT: normal Neck: JVP to 3 cm above clavicle Vascular: No carotid bruits; Distal pulses 2+ bilaterally Cardiac: Irregular HR but with regular 3 beat pattern. Lungs: Bilateral lower lobe rales Abd: soft, nontender, no hepatomegaly  Ext: no edema Musculoskeletal:  No deformities, BUE and BLE strength normal and equal Skin: warm and dry  Neuro:  CNs 2-12 intact, no focal abnormalities noted Psych:  Normal affect   EKG:  The EKG was personally reviewed and demonstrates:  afib with ventricular pacing Telemetry:  Telemetry was personally reviewed and demonstrates:  ventricular pacing with 3 beat pattern  Relevant CV Studies: IMPRESSIONS    1. Left ventricular ejection fraction, by estimation, is 25%. The left  ventricle has normal function. The left ventricle demonstrates global  hypokinesis. The left ventricular internal cavity size was moderately  dilated. Left  ventricular diastolic  parameters are indeterminate.   2. Pacing wires in RA/RV . Right ventricular systolic function is normal.  The right ventricular size is normal. There is normal pulmonary artery  systolic pressure.   3. Left atrial size was mildly dilated.   4. The mitral valve is abnormal. Mild to moderate mitral valve  regurgitation. No evidence of mitral stenosis.   5. The aortic valve is tricuspid. There is mild calcification of the  aortic valve. Aortic valve regurgitation is mild. Aortic valve sclerosis  is present, with no evidence of aortic valve stenosis.   6. The inferior vena cava is dilated in size with >50% respiratory  variability, suggesting right atrial pressure of 8 mmHg.   FINDINGS   Left Ventricle: Left ventricular ejection fraction, by estimation, is  25%. The left ventricle has normal function. The left ventricle  demonstrates global hypokinesis. The left ventricular internal cavity size  was moderately dilated. There is no left  ventricular hypertrophy. Left ventricular diastolic parameters are  indeterminate.   Right Ventricle: Pacing wires in RA/RV. The right ventricular size is  normal. No increase in right ventricular wall thickness. Right ventricular  systolic function is normal. There is normal pulmonary artery systolic  pressure. The tricuspid regurgitant  velocity is 2.61 m/s, and with an assumed right atrial pressure of 3 mmHg,  the estimated right ventricular systolic pressure is 00.3 mmHg.   Left Atrium: Left atrial size was mildly dilated.   Right Atrium: Right atrial size was normal in size.   Pericardium: There is no evidence of pericardial effusion.   Mitral Valve: The mitral valve is abnormal. There is mild calcification of  the mitral valve leaflet(s). Mild mitral annular calcification. Mild to  moderate mitral valve regurgitation. No evidence of mitral valve stenosis.   Tricuspid Valve: The tricuspid valve is normal in structure.  Tricuspid  valve regurgitation is mild . No evidence of tricuspid stenosis.   Aortic Valve: The aortic valve is tricuspid. There is mild calcification  of the aortic valve. Aortic valve regurgitation is mild. Aortic valve  sclerosis is present, with no evidence of aortic valve stenosis.   Pulmonic Valve: The pulmonic valve was normal in structure. Pulmonic valve  regurgitation is not visualized. No evidence of pulmonic stenosis.   Aorta: The aortic root is normal in size and structure.   Venous: The inferior vena cava is dilated in size with greater than 50%  respiratory variability, suggesting right atrial pressure of 8 mmHg.   IAS/Shunts: No atrial level shunt detected  by color flow Doppler.   Additional Comments: A device lead is visualized.      LEFT VENTRICLE  PLAX 2D  LVIDd:         7.00 cm  LVIDs:         5.90 cm  LV PW:         1.10 cm  LV IVS:        1.10 cm  LVOT diam:     2.00 cm  LV SV:         33  LV SV Index:   21  LVOT Area:     3.14 cm      RIGHT VENTRICLE  TAPSE (M-mode): 2.1 cm   LEFT ATRIUM              Index        RIGHT ATRIUM           Index  LA diam:        4.10 cm  2.65 cm/m   RA Area:     25.70 cm  LA Vol (A2C):   118.0 ml 76.31 ml/m  RA Volume:   94.00 ml  60.79 ml/m  LA Vol (A4C):   91.0 ml  58.85 ml/m  LA Biplane Vol: 106.0 ml 68.55 ml/m   AORTIC VALVE  LVOT Vmax:   65.30 cm/s  LVOT Vmean:  40.400 cm/s  LVOT VTI:    0.105 m     AORTA  Ao Root diam: 3.20 cm   MITRAL VALVE                  TRICUSPID VALVE  MV Area (PHT): 4.60 cm       TR Peak grad:   27.2 mmHg  MV Decel Time: 165 msec       TR Vmax:        261.00 cm/s  MR Peak grad:    54.8 mmHg  MR Mean grad:    35.0 mmHg    SHUNTS  MR Vmax:         370.00 cm/s  Systemic VTI:  0.10 m  MR Vmean:        273.0 cm/s   Systemic Diam: 2.00 cm  MR PISA:         1.01 cm  MR PISA Eff ROA: 7 mm  MR PISA Radius:  0.40 cm  MV E velocity: 75.00 cm/s   Laboratory Data:  High  Sensitivity Troponin:   Recent Labs  Lab 06/04/22 1720 06/20/22 1053 06/20/22 1255  TROPONINIHS 9 19* 18*     Chemistry Recent Labs  Lab 06/21/22 0431 06/22/22 0513 06/23/22 0504  NA 131* 135 136  K 3.7 3.2* 3.5  CL 100 101 102  CO2 '22 27 29  '$ GLUCOSE 94 89 82  BUN 31* 22 21  CREATININE 1.33* 1.20* 0.95  CALCIUM 8.4* 8.2* 8.3*  GFRNONAA 39* 44* 58*  ANIONGAP '9 7 5    '$ Recent Labs  Lab 06/20/22 1053  PROT 7.9  ALBUMIN 2.9*  AST 47*  ALT 31  ALKPHOS 58  BILITOT 1.4*   Lipids No results for input(s): "CHOL", "TRIG", "HDL", "LABVLDL", "LDLCALC", "CHOLHDL" in the last 168 hours.  Hematology Recent Labs  Lab 06/20/22 1053  WBC 6.8  RBC 3.42*  HGB 11.6*  HCT 35.7*  MCV 104.4*  MCH 33.9  MCHC 32.5  RDW 15.2  PLT 207   Thyroid No results for input(s): "TSH", "FREET4" in the last 168  hours.  BNP Recent Labs  Lab 06/20/22 1053  BNP 2,262.6*    DDimer No results for input(s): "DDIMER" in the last 168 hours.   Radiology/Studies:  DG Chest 2 View  Result Date: 06/20/2022 CLINICAL DATA:  Shortness of breath. EXAM: CHEST - 2 VIEW COMPARISON:  06/04/2022 FINDINGS: Left-sided pacemaker unchanged. Lungs are adequately inflated and demonstrate new bibasilar opacification left greater than right likely small effusions with associated atelectasis although infection in the lung bases is possible. Subtle hazy prominence of the central pulmonary vessels which can be seen with mild vascular congestion. Stable moderate cardiomegaly. Remainder of the exam is unchanged. IMPRESSION: 1. New bibasilar opacification left greater than right likely small effusions with associated atelectasis, although infection in the lung bases is possible. 2. Stable moderate cardiomegaly with suggestion of mild vascular congestion. Electronically Signed   By: Marin Olp M.D.   On: 06/20/2022 11:59     Assessment and Plan:   Acute on chronic HFrEF (EF 25%). Acute on chronic hypoxic respiratory  failure  Patient with ~3 weeks of HF exacerbation symptoms including worsening shortness of breath, decreased BP, and lower extremity edema, found hypotensive and hypoxic in the ED. She has received IV lasix by general medicine with unclear response by I/O documentation. Patient is requiring constant O2 support via [place] rather than nocturnal only as used at home. Due to low BP, patient was started on midodrine on 9/12. Coreg was discontinued for low BP after morning dose on 9/13.   04/05/22 echo with global LV hypokinesis, LVEF 25%. 03/05/20 echo with regional wall motion abnormalities, LVEF 20%  BNP 2262.6 this admission, up from 712.1 on 06/02/22 Patient has bi-basilar rales on exam with elevation of JVP today. She appears to need additional diuresis (held this morning due to low BP). However, given poor response to IV lasix up to this point, it would appear that patient may require inotrope supported diuresis. Midodrine may assist with BP but is unlikely to provide diuresis support. Based on my discussion with patient and family, unclear whether Dobutamine use aligns with her goals of care. Patient unfortunately reports bad experiences with healthcare in the past and does not appear to have be fully aware of the severity of her HF.  Recommendations: Continue IV diuresis Stop Coreg Continue Midodrine but work to establish goals of care Titrate O2 support to home levels as able Continue caloric/protein supplementation Consider HF evaluation to guide management Consult palliative care  Mild to moderate mitral regurgitation  Stable on 06/26 echo. Continue to monitor in outpatient setting.  Permanent atrial fibrillation Status post AVN ablation with CRT-P (2020)  Telemetry and ECG without acute abnormalities. Okay to stop Coreg. Continue Eliquis 2.'5mg'$  BID  Risk Assessment/Risk Scores:   New York Heart Association (NYHA) Functional Class NYHA Class III  CHA2DS2-VASc Score = 8   This  indicates a 10.8% annual risk of stroke. The patient's score is based upon: CHF History: 1 HTN History: 1 Diabetes History: 0 Stroke History: 2 Vascular Disease History: 1 Age Score: 2 Gender Score: 1   For questions or updates, please contact [place] Please consult [email or web address] for contact info under    Signed, Lily Kocher, PA-C  06/23/2022 3:23 PM

## 2022-06-24 DIAGNOSIS — I5023 Acute on chronic systolic (congestive) heart failure: Secondary | ICD-10-CM | POA: Diagnosis not present

## 2022-06-24 DIAGNOSIS — S3210XA Unspecified fracture of sacrum, initial encounter for closed fracture: Secondary | ICD-10-CM | POA: Diagnosis not present

## 2022-06-24 DIAGNOSIS — N189 Chronic kidney disease, unspecified: Secondary | ICD-10-CM | POA: Diagnosis not present

## 2022-06-24 DIAGNOSIS — Z711 Person with feared health complaint in whom no diagnosis is made: Secondary | ICD-10-CM

## 2022-06-24 DIAGNOSIS — Z7189 Other specified counseling: Secondary | ICD-10-CM

## 2022-06-24 DIAGNOSIS — I482 Chronic atrial fibrillation, unspecified: Secondary | ICD-10-CM | POA: Diagnosis not present

## 2022-06-24 DIAGNOSIS — N179 Acute kidney failure, unspecified: Secondary | ICD-10-CM | POA: Diagnosis not present

## 2022-06-24 DIAGNOSIS — Z789 Other specified health status: Secondary | ICD-10-CM

## 2022-06-24 DIAGNOSIS — Z515 Encounter for palliative care: Secondary | ICD-10-CM

## 2022-06-24 LAB — BASIC METABOLIC PANEL
Anion gap: 6 (ref 5–15)
BUN: 22 mg/dL (ref 8–23)
CO2: 28 mmol/L (ref 22–32)
Calcium: 8.6 mg/dL — ABNORMAL LOW (ref 8.9–10.3)
Chloride: 102 mmol/L (ref 98–111)
Creatinine, Ser: 1.06 mg/dL — ABNORMAL HIGH (ref 0.44–1.00)
GFR, Estimated: 51 mL/min — ABNORMAL LOW (ref >60)
Glucose, Bld: 89 mg/dL (ref 70–99)
Potassium: 4.9 mmol/L (ref 3.5–5.1)
Sodium: 136 mmol/L (ref 135–145)

## 2022-06-24 LAB — CBC
HCT: 34.4 % — ABNORMAL LOW (ref 36.0–46.0)
Hemoglobin: 11 g/dL — ABNORMAL LOW (ref 12.0–15.0)
MCH: 33.7 pg (ref 26.0–34.0)
MCHC: 32 g/dL (ref 30.0–36.0)
MCV: 105.5 fL — ABNORMAL HIGH (ref 80.0–100.0)
Platelets: 245 10*3/uL (ref 150–400)
RBC: 3.26 MIL/uL — ABNORMAL LOW (ref 3.87–5.11)
RDW: 15.2 % (ref 11.5–15.5)
WBC: 7.4 10*3/uL (ref 4.0–10.5)
nRBC: 0 % (ref 0.0–0.2)

## 2022-06-24 NOTE — Consult Note (Signed)
Consultation Note Date: 06/24/2022   Patient Name: Carol Hardin  DOB: October 04, 1933  MRN: [id number]  Age / Sex: 88 y.o., female  PCP: Celene Squibb, MD Referring Physician: Domenic Polite, MD  Reason for Consultation: Establishing goals of care  HPI/Patient Profile: 88 y.o. female  with past medical history of  chronic HFrEF (recent echo showed EF 25%), chronic A-fib, sick sinus syndrome status post bi-chamber PPM, chronic hypoxic respiratory failure on bedtime oxygen at home presented to ED on 06/20/22 from home with worsening hypoxia/shortness of breath, worsening pain, and hypotension. Patient was admitted on 06/20/2022 with acute on chronic HFrEF decompensation, afib with RVR, acute hypoxic respiratory failure, moderate protein calorie malnutrition, recent sacral fracture.    Clinical Assessment and Goals of Care: I have reviewed medical records including EPIC notes, labs, and imaging. Received report from primary RN - no acute concerns. Reports patient was up to chair for 2.5 hours today, is eating/drinking.   Went to visit patient at bedside - no family/visitors present. Patient was lying in bed asleep - she does easily wake to voice and is alert, oriented, and able to participate in conversation. No signs or non-verbal gestures of pain or discomfort noted. No respiratory distress, increased work of breathing, or secretions noted. She denies pain while lying down, endorses pain while standing. Denies shortness of breath. She is on 2L O2 [place].  I introduced Palliative Medicine as specialized medical care for people living with serious illness. It focuses on providing relief from the symptoms and stress of a serious illness. The goal is to improve quality of life for both the patient and the family.  Patient is hopeful [place] discussion can be completed with her husband, 4 sons, and daughter in law present. She would  like for me to call her son/Robin to schedule family meeting.  Therapeutic listening and emotional support provided as patient reflects on her medical history, recent falls, and weakness.   4:10 PM Attempted to call son/Robin to schedule family meeting - no answer - confidential voicemail left and PMT phone number provided with request to return call.   Primary Decision Maker: PATIENT    SUMMARY OF RECOMMENDATIONS   Continue current full code/full scope care for now - attempting to schedule family meeting for full GOC per patient request PMT will continue to follow and support holistically   Code Status/Advance Care Planning: Full code  Palliative Prophylaxis:  Aspiration, Bowel Regimen, Delirium Protocol, Frequent Pain Assessment, Oral Care, and Turn Reposition  Additional Recommendations (Limitations, Scope, Preferences): Full Scope Treatment  Psycho-social/Spiritual:  Desire for further Chaplaincy support:no Created space and opportunity for patient to express thoughts and feelings regarding patient's current medical situation.  Emotional support and therapeutic listening provided.  Prognosis:  < 6 months  Discharge Planning: To Be Determined      Primary Diagnoses: Present on Admission:  Acute on chronic systolic heart failure (HCC)  Atrial fibrillation, chronic (HCC)  Acute kidney injury superimposed on chronic kidney disease ([place])  Malnutrition, calorie ([place])  Sacral fracture ([place])   I have reviewed the medical record, interviewed the patient and family, and examined the patient. The following aspects are pertinent.  Past Medical History:  Diagnosis Date   Anxiety    Arthritis    "fingers, right toe" (08/09/2016)   Atrial fibrillation (HCC)    Basal cell carcinoma of lower leg, right    Bradycardia    a. Holter 08/30/16 showed profound bradycardia down to 30 during awake hours, several  2 second pauses, very frequent PVCs >8000 in 48 hours, and NSVT  (longest of 14 beats).   Cardiomyopathy ([place])    a. EF 40% by echo 08/2016.   Daily headache    "I usually wake up w/a headache; sometimes it's a migraine" (08/09/2016)   Depression    Diverticulosis    Hx. of   Frequent PVCs    GERD (gastroesophageal reflux disease)    Hypercholesterolemia    Migraine    Mitral regurgitation    MVP (mitral valve prolapse)    NSVT (nonsustained ventricular tachycardia) ([place])    a. first noted event monitor 08/2016.   Osteoporosis    PAF (paroxysmal atrial fibrillation) ([place])    a. in afib at time of echo 08/2016.   Pneumonia    Scoliosis    mild   Squamous cell carcinoma of neck    Stroke ([place]) 05/2018   Social History   Socioeconomic History   Marital status: Married    Spouse name: Not on file   Number of children: 4   Years of education: 2.5 yrs of college   Highest education level: Not on file  Occupational History   Not on file  Tobacco Use   Smoking status: Former    Packs/day: 1.00    Years: 20.00    Total pack years: 20.00    Types: Cigarettes    Quit date: 33    Years since quitting: 34.7   Smokeless tobacco: Never  Vaping Use   Vaping Use: Never used  Substance and Sexual Activity   Alcohol use: No    Comment: quit 1995, was occasional    Drug use: Never   Sexual activity: Not Currently  Other Topics Concern   Not on file  Social History Narrative   Lives at home with spouse   Caffeine: 2 cups coffee/day   Right handed   Social Determinants of Health   Financial Resource Strain: Medium Risk (08/17/2017)   Overall Financial Resource Strain (CARDIA)    Difficulty of Paying Living Expenses: Somewhat hard  Food Insecurity: No Food Insecurity (06/21/2022)   Hunger Vital Sign    Worried About Running Out of Food in the Last Year: Never true    [place] in the Last Year: Never true  Transportation Needs: No Transportation Needs (06/21/2022)   PRAPARE - Hydrologist (Medical): No     Lack of Transportation (Non-Medical): No  Physical Activity: Inactive (08/17/2017)   Exercise Vital Sign    Days of Exercise per Week: 0 days    Minutes of Exercise per Session: 0 min  Stress: Stress Concern Present (08/17/2017)   [place]    Feeling of Stress : Rather much  Social Connections: Moderately Isolated (08/17/2017)   Social Connection and Isolation Panel [NHANES]    Frequency of Communication with Friends and Family: Once a week    Frequency of Social Gatherings with Friends and Family: Once a week    Attends Religious Services: Never    Marine scientist or Organizations: No    Attends Music therapist: Never    Marital Status: Married   Family History  Problem Relation Age of Onset   Heart failure Mother    Leukemia Mother    Heart failure Father    COPD Father    Alzheimer's disease Father    Alzheimer's disease Brother    Heart Problems  Brother        stent   Heart Problems Son        stent    Allergic rhinitis Neg Hx    Angioedema Neg Hx    Asthma Neg Hx    Eczema Neg Hx    Atopy Neg Hx    Immunodeficiency Neg Hx    Urticaria Neg Hx    Headache Neg Hx    Scheduled Meds:  acetaminophen  325-975 mg Oral Daily   apixaban  2.5 mg Oral BID   feeding supplement  237 mL Oral BID BM   furosemide  20 mg Intravenous BID   LORazepam  0.5 mg Oral QHS   midodrine  10 mg Oral BID WC   multivitamin with minerals  1 tablet Oral Daily   senna-docusate  1 tablet Oral BID   sodium chloride flush  3 mL Intravenous Q12H   Continuous Infusions:  sodium chloride     PRN Meds:.sodium chloride, acetaminophen, albuterol, azelastine, ondansetron (ZOFRAN) IV, oxyCODONE, sodium chloride flush Medications Prior to Admission:  Prior to Admission medications   Medication Sig Start Date End Date Taking? Authorizing Provider  acetaminophen (TYLENOL) 325 MG tablet Take 325-975 mg by mouth  daily.   Yes [provider]  albuterol (VENTOLIN HFA) 108 (90 Base) MCG/ACT inhaler Inhale 2 puffs into the lungs every 6 (six) hours as needed for wheezing or shortness of breath. 04/29/22  Yes Byrum, Rose Fillers, MD  apixaban (ELIQUIS) 2.5 MG TABS tablet TAKE 1 TABLET(2.5 MG) BY MOUTH TWICE DAILY 04/05/22  Yes O'Neal, Cassie Freer, MD  azelastine (ASTELIN) 0.1 % nasal spray Place 2 sprays into both nostrils 2 (two) times daily as needed for rhinitis. 05/28/22  Yes Valentina Shaggy, MD  carvedilol (COREG) 3.125 MG tablet Take 1 tablet (3.125 mg total) by mouth 2 (two) times daily with a meal. 03/31/22  Yes O'Neal, Cassie Freer, MD  Cholecalciferol (VITAMIN D-3) 25 MCG (1000 UT) CAPS Take 1 capsule by mouth daily.   Yes [provider]  Ensure (ENSURE) Take 237 mLs by mouth daily.   Yes [provider]  furosemide (LASIX) 20 MG tablet Take 20 mg by mouth as needed for fluid.   Yes [provider]  lisinopril (ZESTRIL) 2.5 MG tablet Take 2.5 mg by mouth daily.   Yes [provider]  LORazepam (ATIVAN) 1 MG tablet Take 1 mg by mouth at bedtime.   Yes [provider]  Polyethyl Glycol-Propyl Glycol (SYSTANE OP) Apply to eye as needed.   Yes [provider]  Vibegron (GEMTESA) 75 MG TABS Take 75 mg by mouth daily.   Yes [provider]  divalproex (DEPAKOTE ER) 250 MG 24 hr tablet Take 1 tablet (250 mg total) by mouth daily. Patient not taking: Reported on 06/04/2022 04/30/22   Pieter Partridge, DO  Fluocinolone Acetonide Scalp 0.01 % OIL Apply 1 Application topically 2 (two) times daily as needed. Patient not taking: Reported on 06/04/2022 05/23/22   Dara Hoyer, FNP  HYDROcodone-acetaminophen (NORCO/VICODIN) 5-325 MG tablet Take 1 tablet by mouth every 4 (four) hours as needed for severe pain. Patient not taking: Reported on 06/20/2022 06/16/22   Evlyn Courier, PA-C   Allergies  Allergen Reactions   Omeprazole Other (See Comments)     siezure   Flagyl [Metronidazole] Nausea And Vomiting   Amiodarone    Aripiprazole Other (See Comments)    Unknown reaction   Clindamycin/Lincomycin Other (See Comments)    Bacterial  infection / burning in throat   Gabapentin Itching   Hylan G-F 20 Other (See Comments)    Unknown reaction   Metoprolol Tartrate    Paroxetine    Paroxetine Hcl Other (See Comments)    Headaches (a long time ago- pt doesn't really remember)   Statins Other (See Comments)    No specific reaction given-patient states that she was advised not to take by physician   Sulfa Antibiotics Diarrhea and Other (See Comments)    Colitis    Toprol Xl [Metoprolol Succinate] Other (See Comments)    Dizziness--pt doesn't really remember    Vancomycin Itching and Other (See Comments)    Pt reports that they gave it too fast- she started having itching in the scalp   Penicillins Rash    DID THE REACTION INVOLVE: Swelling of the face/tongue/throat, SOB, or low BP? No Sudden or severe rash/hives, skin peeling, or the inside of the mouth or nose? Yes Did it require medical treatment? Unknown When did it last happen?  Over 10 years     If all above answers are "NO", may proceed with cephalosporin use.    Review of Systems  Constitutional:  Positive for activity change, appetite change and fatigue.  Respiratory:  Negative for shortness of breath.   Gastrointestinal:  Negative for nausea and vomiting.  Neurological:  Positive for weakness.  All other systems reviewed and are negative.   Physical Exam Vitals and nursing note reviewed.  Constitutional:      General: She is not in acute distress. Pulmonary:     Effort: No respiratory distress.  Skin:    General: Skin is warm and dry.  Neurological:     Mental Status: She is alert and oriented to person, place, and time.     Motor: Weakness present.  Psychiatric:        Attention and Perception: Attention normal.        Behavior: Behavior is cooperative.         Cognition and Memory: Cognition and memory normal.     Vital Signs: BP 99/75 (BP Location: Left Arm)   Pulse 87   Temp (!) 97.5 F (36.4 C) (Oral)   Resp 18   Ht '5\' 3"'$  (1.6 m)   Wt 53.2 kg   SpO2 97%   BMI 20.76 kg/m  Pain Scale: 0-10 POSS *See Group Information*: 1-Acceptable,Awake and alert Pain Score: Asleep   SpO2: SpO2: 97 % O2 Device:SpO2: 97 % O2 Flow Rate: .O2 Flow Rate (L/min): 2 L/min  IO: Intake/output summary:  Intake/Output Summary (Last 24 hours) at 06/24/2022 1510 Last data filed at 06/24/2022 1030 Gross per 24 hour  Intake 757 ml  Output 510 ml  Net 247 ml    LBM: Last BM Date : 06/21/22 Baseline Weight: Weight: 61.4 kg Most recent weight: Weight: 53.2 kg     Palliative Assessment/Data: PPS 50%     Time In: 1500 Time Out: 1551 Time Total: 51 minutes  Greater than 50%  of this time was spent counseling and coordinating care related to the above assessment and plan.  Signed by: Lin Landsman, NP   Please contact Palliative Medicine Team phone at [phone] for questions and concerns.  For individual provider: See Amion  *Portions of this note are a verbal dictation therefore any spelling and/or grammatical errors are due to the "[employer]" system interpretation.

## 2022-06-24 NOTE — Progress Notes (Signed)
Progress Note  Patient Name: Carol Hardin Date of Encounter: 06/24/2022  [facility] Cardiologist: Carlyle Dolly, MD   Subjective   Denies any chest pain.  SOB improved.  Unclear if I's and O's are accurate.  She put out 760 cc yesterday and is net +14 cc.  No change in weight  Inpatient Medications    Scheduled Meds:  acetaminophen  325-975 mg Oral Daily   apixaban  2.5 mg Oral BID   feeding supplement  237 mL Oral BID BM   furosemide  20 mg Intravenous BID   LORazepam  0.5 mg Oral QHS   midodrine  10 mg Oral BID WC   multivitamin with minerals  1 tablet Oral Daily   senna-docusate  1 tablet Oral BID   sodium chloride flush  3 mL Intravenous Q12H   Continuous Infusions:  sodium chloride     PRN Meds: sodium chloride, acetaminophen, albuterol, azelastine, ondansetron (ZOFRAN) IV, oxyCODONE, sodium chloride flush   Vital Signs    Vitals:   06/23/22 2326 06/24/22 0000 06/24/22 0337 06/24/22 0710  BP: 1'05/68 92/65 95/71 '$ 99/75  Pulse: 71  90 87  Resp: '19  18 18  '$ Temp: 98.2 F (36.8 C)  98.2 F (36.8 C) (!) 97.5 F (36.4 C)  TempSrc: Oral  Oral Oral  SpO2: 95%  95% 97%  Weight:   53.2 kg   Height:        Intake/Output Summary (Last 24 hours) at 06/24/2022 0753 Last data filed at 06/24/2022 0300 Gross per 24 hour  Intake 1354 ml  Output 760 ml  Net 594 ml      06/24/2022    3:37 AM 06/23/2022    4:06 AM 06/22/2022    5:00 AM  Last 3 Weights  Weight (lbs) 117 lb 3.2 oz 117 lb 14.4 oz 90 lb 6.2 oz  Weight (kg) 53.162 kg 53.479 kg 41 kg      Telemetry    Normal sinus rhythm- Personally Reviewed  ECG    No new EKG to review- Personally Reviewed  Physical Exam   GEN: No acute distress.   Neck: No JVD Cardiac: RRR, no murmurs, rubs, or gallops.  Respiratory: Clear to auscultation bilaterally. GI: Soft, nontender, non-distended  MS: No edema; No deformity. Neuro:  Nonfocal  Psych: Normal affect   Labs    High Sensitivity Troponin:    Recent Labs  Lab 06/04/22 1720 06/20/22 1053 06/20/22 1255  TROPONINIHS 9 19* 18*      Chemistry Recent Labs  Lab 06/20/22 1053 06/21/22 0431 06/22/22 0513 06/23/22 0504 06/24/22 0328  NA 133*   < > 135 136 136  K 4.6   < > 3.2* 3.5 4.9  CL 102   < > 101 102 102  CO2 22   < > '27 29 28  '$ GLUCOSE 103*   < > 89 82 89  BUN 28*   < > '22 21 22  '$ CREATININE 1.01*   < > 1.20* 0.95 1.06*  CALCIUM 9.0   < > 8.2* 8.3* 8.6*  PROT 7.9  --   --   --   --   ALBUMIN 2.9*  --   --   --   --   AST 47*  --   --   --   --   ALT 31  --   --   --   --   ALKPHOS 58  --   --   --   --  BILITOT 1.4*  --   --   --   --   GFRNONAA 54*   < > 44* 58* 51*  ANIONGAP 9   < > '7 5 6   '$ < > = values in this interval not displayed.     Hematology Recent Labs  Lab 06/20/22 1053 06/24/22 0328  WBC 6.8 7.4  RBC 3.42* 3.26*  HGB 11.6* 11.0*  HCT 35.7* 34.4*  MCV 104.4* 105.5*  MCH 33.9 33.7  MCHC 32.5 32.0  RDW 15.2 15.2  PLT 207 245    BNP Recent Labs  Lab 06/20/22 1053  BNP 2,262.6*     DDimer No results for input(s): "DDIMER" in the last 168 hours.   CHA2DS2-VASc Score = 8   This indicates a 10.8% annual risk of stroke. The patient's score is based upon: CHF History: 1 HTN History: 1 Diabetes History: 0 Stroke History: 2 Vascular Disease History: 1 Age Score: 2 Gender Score: 1      Radiology    No results found.  Cardiac Studies   None  Patient Profile     88 y.o. female  with a hx of chronic heart failure with reduced ejection fraction (25%), mild to moderate mitral regurg, sick sinus syndrome, tachy-brady syndrome, permanent afib, status post AVN ablation with CRT-P (2020), chronic hypoxic respiratory failure on bedtime oxygen, CVA (2019) at home who is being seen 06/23/2022 for the evaluation of acute heart failure with reduced ejection fraction at the request of Dr. Broadus John.  Assessment & Plan    Dilated cardiomyopathy/ acute on chronic systolic CHF -Admitted  with 3-week history of shortness of breath, lower extremity edema, dizziness and hypoxemia -She has been on IV Lasix but does not appear to have good urine output response to it unclear if part of this is due to inaccurate I's and O's. -BP too soft to increase Lasix further -Not able to start GDMT due to soft blood pressures -Midodrine added yesterday at 10 mg 2 times daily but will increase to 3 times daily today -Add compression hose -She is not a candidate for advanced heart failure therapies given her advanced age -I told her that IV medication such as Levophed/dobutamine would only be a temporizing treatment and long-term prognosis is poor -Talked with her and her husband yesterday about goals of care.  I recommended palliative care consult which is pending  Chronic atrial fibrillation -Heart rate controlled on telemetry -No beta-blockers due to hypotension -Continue apixaban for now  Chronic hypoxemic respiratory failure -On O2 at home at night  Will sign off call with any questions.      For questions or updates, please contact [place] Please consult [email or web address] for contact info under        Signed, Fransico Him, MD  06/24/2022, 7:53 AM

## 2022-06-24 NOTE — Progress Notes (Signed)
PROGRESS NOTE    Carol Hardin  BJY:[id number] DOB: 13-Jun-1933 DOA: 06/20/2022 PCP: Celene Squibb, MD   Carol Hardin is a 88 y.o. female with medical history significant of chronic HFrEF EF 25%, MR, chronic A-fib, SSS s/p PPM, chronic hypoxic respiratory failure on at bedtime oxygen at home, presented with persistent hypotension and worsening of hypoxia. Patient started to have bilateral lower extremity swelling 2 to 3 weeks ago, went to see cardiology, and her CHF medication adjusted.  lisinopril was discontinued and patient was started on p.o. Lasix 20 mg daily.   Overnight, family took her pulse ox which was 70s.  In ED, BP soft, Afib w/ mild tachycardia, CXr w/ pulm vasc congestion, b/l pleural effusions, admitted   Subjective: -Tired, breathing a little better, diffuse pains  Assessment and Plan:  Acute on chronic systolic heart failure (HCC) Echocardiogram with reduced LV systolic function EF 08-65%, with global hypokinesis.  Moderate MR -Poor response to diuretics thus far, also limited by hypotension, history of longstanding orthostatic hypotension as well -Now in low output CHF state -Seen by cardiology, she is not a candidate for advanced cardiac therapies, inotropes etc. discussed poor prognosis with patient and family yesterday and today -Cardiology has told her nothing further to offer, I recommended DNR and discussion regarding hospice with palliative care -Continue to hold Coreg, increased midodrine dose -Discussed CODE STATUS again today, she is unable to make a decision  Atrial fibrillation, chronic ([place]) Patient with intermittent ventricular pacing. -Holding carvedilol with hypotension this morning, continue apixaban  Chronic hypoxic respiratory failure  -On home O2 2 L at baseline at bedtime  Acute kidney injury superimposed on chronic kidney disease ([name]) CKD stage 3b. Hyponatremia, hypokalemia  -Resolved  Moderate protein calorie malnutrition -Continue  supplements as tolerated  Sacral fracture (HCC) 09/04 sacral ala fracture.  -DC morphine, continue IV oxycodone, add laxatives  DVT prophylaxis: Apixaban Code Status: Full code, she will think about DNR Family Communication: Discussed with spouse and kids at bedside Disposition Plan: To be determined  Consultants: Cardiology   Procedures:   Antimicrobials:    Objective: Vitals:   06/23/22 2326 06/24/22 0000 06/24/22 0337 06/24/22 0710  BP: 1'05/68 92/65 95/71 '$ 99/75  Pulse: 71  90 87  Resp: '19  18 18  '$ Temp: 98.2 F (36.8 C)  98.2 F (36.8 C) (!) 97.5 F (36.4 C)  TempSrc: Oral  Oral Oral  SpO2: 95%  95% 97%  Weight:   53.2 kg   Height:        Intake/Output Summary (Last 24 hours) at 06/24/2022 1136 Last data filed at 06/24/2022 1030 Gross per 24 hour  Intake 997 ml  Output 760 ml  Net 237 ml   Filed Weights   06/22/22 0500 06/23/22 0406 06/24/22 0337  Weight: 41 kg 53.5 kg 53.2 kg    Examination:  General exam: Frail chronically ill elderly female sitting up in bed, AAOx3, no distress HEENT: Positive JVD CVS: S1-S2, regular rhythm, systolic murmur Lungs: Bilateral Rales noted Abdomen: Soft, nontender, nondistended, bowel sounds present Extremities: No edema Skin: No rashes Psychiatry: Flat affect    Data Reviewed:   CBC: Recent Labs  Lab 06/20/22 1053 06/24/22 0328  WBC 6.8 7.4  HGB 11.6* 11.0*  HCT 35.7* 34.4*  MCV 104.4* 105.5*  PLT 207 784   Basic Metabolic Panel: Recent Labs  Lab 06/20/22 1053 06/21/22 0431 06/22/22 0513 06/23/22 0504 06/24/22 0328  NA 133* 131* 135 136 136  K 4.6 3.7  3.2* 3.5 4.9  CL 102 100 101 102 102  CO2 '22 22 27 29 28  '$ GLUCOSE 103* 94 89 82 89  BUN 28* 31* '22 21 22  '$ CREATININE 1.01* 1.33* 1.20* 0.95 1.06*  CALCIUM 9.0 8.4* 8.2* 8.3* 8.6*   GFR: Estimated Creatinine Clearance: 31.5 mL/min (A) (by C-G formula based on SCr of 1.06 mg/dL (H)). Liver Function Tests: Recent Labs  Lab 06/20/22 1053  AST  47*  ALT 31  ALKPHOS 58  BILITOT 1.4*  PROT 7.9  ALBUMIN 2.9*   No results for input(s): "LIPASE", "AMYLASE" in the last 168 hours. No results for input(s): "AMMONIA" in the last 168 hours. Coagulation Profile: No results for input(s): "INR", "PROTIME" in the last 168 hours. Cardiac Enzymes: No results for input(s): "CKTOTAL", "CKMB", "CKMBINDEX", "TROPONINI" in the last 168 hours. BNP (last 3 results) No results for input(s): "PROBNP" in the last 8760 hours. HbA1C: No results for input(s): "HGBA1C" in the last 72 hours. CBG: No results for input(s): "GLUCAP" in the last 168 hours. Lipid Profile: No results for input(s): "CHOL", "HDL", "LDLCALC", "TRIG", "CHOLHDL", "LDLDIRECT" in the last 72 hours. Thyroid Function Tests: No results for input(s): "TSH", "T4TOTAL", "FREET4", "T3FREE", "THYROIDAB" in the last 72 hours. Anemia Panel: No results for input(s): "VITAMINB12", "FOLATE", "FERRITIN", "TIBC", "IRON", "RETICCTPCT" in the last 72 hours. Urine analysis:    Component Value Date/Time   COLORURINE YELLOW 08/20/2019 2047   APPEARANCEUR CLEAR 08/20/2019 2047   LABSPEC >1.046 (H) 08/20/2019 2047   PHURINE 6.0 08/20/2019 2047   GLUCOSEU NEGATIVE 08/20/2019 2047   HGBUR SMALL (A) 08/20/2019 2047   BILIRUBINUR negative 04/18/2021 1542   KETONESUR negative 04/18/2021 1542   KETONESUR 5 (A) 08/20/2019 2047   PROTEINUR >=300 (A) 04/18/2021 1542   PROTEINUR NEGATIVE 08/20/2019 2047   UROBILINOGEN 1.0 04/18/2021 1542   UROBILINOGEN 0.2 08/16/2008 2359   NITRITE Positive (A) 04/18/2021 1542   NITRITE NEGATIVE 08/20/2019 2047   LEUKOCYTESUR Small (1+) (A) 04/18/2021 1542   LEUKOCYTESUR NEGATIVE 08/20/2019 2047   Sepsis Labs: '@LABRCNTIP'$ (procalcitonin:4,lacticidven:4)  ) Recent Results (from the past 240 hour(s))  SARS Coronavirus 2 by RT PCR (hospital order, performed in [place] hospital lab) *cepheid single result test* Anterior Nasal Swab     Status: None   Collection  Time: 06/20/22 12:38 PM   Specimen: Anterior Nasal Swab  Result Value Ref Range Status   SARS Coronavirus 2 by RT PCR NEGATIVE NEGATIVE Final    Comment: (NOTE) SARS-CoV-2 target nucleic acids are NOT DETECTED.  The SARS-CoV-2 RNA is generally detectable in upper and lower respiratory specimens during the acute phase of infection. The lowest concentration of SARS-CoV-2 viral copies this assay can detect is 250 copies / mL. A negative result does not preclude SARS-CoV-2 infection and should not be used as the sole basis for treatment or other patient management decisions.  A negative result may occur with improper specimen collection / handling, submission of specimen other than nasopharyngeal swab, presence of viral mutation(s) within the areas targeted by this assay, and inadequate number of viral copies (<250 copies / mL). A negative result must be combined with clinical observations, patient history, and epidemiological information.  Fact Sheet for Patients:   https://www.patel.info/  Fact Sheet for Healthcare Providers: https://hall.com/  This test is not yet approved or  cleared by the [place] FDA and has been authorized for detection and/or diagnosis of SARS-CoV-2 by FDA under an Emergency Use Authorization (EUA).  This EUA will remain in effect (  meaning this test can be used) for the duration of the COVID-19 declaration under Section 564(b)(1) of the Act, 21 U.S.C. section 360bbb-3(b)(1), unless the authorization is terminated or revoked sooner.  Performed at [facility], [place] [address] [place], [place] [address]      Radiology Studies: No results found.   Scheduled Meds:  acetaminophen  325-975 mg Oral Daily   apixaban  2.5 mg Oral BID   feeding supplement  237 mL Oral BID BM   furosemide  20 mg Intravenous BID   LORazepam  0.5 mg Oral QHS   midodrine  10 mg Oral BID WC   multivitamin with minerals  1  tablet Oral Daily   senna-docusate  1 tablet Oral BID   sodium chloride flush  3 mL Intravenous Q12H   Continuous Infusions:  sodium chloride       LOS: 3 days    Time spent: 31mn  PDomenic Polite MD [facility]   06/24/2022, 11:36 AM

## 2022-06-24 NOTE — Progress Notes (Signed)
Physical Therapy Treatment Patient Details Name: Carol Hardin MRN: [id number] DOB: 1933-04-29 Today's Date: 06/24/2022   History of Present Illness 88 yo female admitted 9/10 with hypoxia and hypotension. PMhx: 9/4 fall in yard with visit to ED with nondisplaced sacral ala fx. HFrEF, Afib, SSS s/p PPM, chronic respiratory failure on nocturnal O2, HLD, GERD, depression    PT Comments    Pt received supine pleasant and agreeable to mobility, despite reluctance secondary to sacral pain. Pt mod I for bed mobility with increased time and use of bedrail, with light cues needed to scoot to edge. Pt able to come to standing with min guard at start of session, however needing increased assist to power up with increased fatigue for subsequent times as session progressed. Pt demonstrating ambulation with min guard with RW with cues for wider BOS and posture, pt with no noted LOB and fair navigation around obstacles in room and hall. Pt agreeable to time up in chair at end of session with education and discussion on benefits of upright sitting, activity recommendations, energy conservation strategies and benefits of continued mobility with pt and pt spouse verbalizing understanding. VSS stable on supplemental O2 throughout session. Pt continues to benefit from skilled PT services to progress toward functional mobility goals.    Recommendations for follow up therapy are one component of a multi-disciplinary discharge planning process, led by the attending physician.  Recommendations may be updated based on patient status, additional functional criteria and insurance authorization.  Follow Up Recommendations  Home health PT     Assistance Recommended at Discharge Intermittent Supervision/Assistance  Patient can return home with the following A little help with walking and/or transfers;A little help with bathing/dressing/bathroom;Assistance with cooking/housework;Direct supervision/assist for financial  management;Assist for transportation;Help with stairs or ramp for entrance   Equipment Recommendations  None recommended by PT    Recommendations for Other Services       Precautions / Restrictions Precautions Precautions: Fall Restrictions Weight Bearing Restrictions: No     Mobility  Bed Mobility Overal bed mobility: Needs Assistance Bed Mobility: Supine to Sit     Supine to sit: Min guard, HOB elevated     General bed mobility comments: increased time with use of bedrails, cues to scoot to edge to place feet on floor    Transfers Overall transfer level: Needs assistance Equipment used: Rolling walker (2 wheels) Transfers: Sit to/from Stand Sit to Stand: Min guard           General transfer comment: Min guard for safety from EOB x1, cues for hand placement as pt pulling up on RW.    Ambulation/Gait Ambulation/Gait assistance: Min guard Gait Distance (Feet): 125 Feet Assistive device: Rolling walker (2 wheels) Gait Pattern/deviations: Step-through pattern, Decreased stride length, Trunk flexed, Narrow base of support Gait velocity: decr     General Gait Details: very slow, mostly steady gait with narrow BOS with cues to widen with pt able to correct but unable to maintain, cues for posture and proximity to RW. Poor pleth reading, no c/o DOE and no noted SOB   Stairs             Wheelchair Mobility    Modified Rankin (Stroke Patients Only)       Balance Overall balance assessment: Needs assistance, History of Falls Sitting-balance support: Feet supported, No upper extremity supported Sitting balance-Leahy Scale: Good Sitting balance - Comments: can reach towards feet to pull up underwear without LOB   Standing balance support: During functional  activity, Reliant on assistive device for balance Standing balance-Leahy Scale: Poor Standing balance comment: RW for gait, recent fall                            Cognition  Arousal/Alertness: Awake/alert Behavior During Therapy: WFL for tasks assessed/performed Overall Cognitive Status: Impaired/Different from baseline Area of Impairment: Memory                     Memory: Decreased short-term memory                  Exercises General Exercises - Lower Extremity Ankle Circles/Pumps: AROM, Right, Left, 10 reps    General Comments General comments (skin integrity, edema, etc.): VSS on supplemental O2 when good pleth avaliable, pt up in chair at end of session, encouraged pt to remain upright for as long as tolerable with education on benefits      Pertinent Vitals/Pain Pain Assessment Pain Assessment: Faces Faces Pain Scale: Hurts little more Pain Location: back/leg with mobility Pain Descriptors / Indicators: Discomfort Pain Intervention(s): Monitored during session, Limited activity within patient's tolerance, Repositioned    Home Living                          Prior Function            PT Goals (current goals can now be found in the care plan section) Acute Rehab PT Goals PT Goal Formulation: With patient/family Time For Goal Achievement: 07/05/22    Frequency    Min 3X/week      PT Plan Current plan remains appropriate    Co-evaluation              AM-PAC PT "6 Clicks" Mobility   Outcome Measure  Help needed turning from your back to your side while in a flat bed without using bedrails?: A Little Help needed moving from lying on your back to sitting on the side of a flat bed without using bedrails?: A Little Help needed moving to and from a bed to a chair (including a wheelchair)?: A Little Help needed standing up from a chair using your arms (e.g., wheelchair or bedside chair)?: A Little Help needed to walk in hospital room?: A Little Help needed climbing 3-5 steps with a railing? : A Lot 6 Click Score: 17    End of Session   Activity Tolerance: Patient limited by fatigue;Patient tolerated  treatment well Patient left: in chair;with call bell/phone within reach;with family/visitor present Nurse Communication: Mobility status PT Visit Diagnosis: Other abnormalities of gait and mobility (R26.89);Difficulty in walking, not elsewhere classified (R26.2)     Time: 0921-1000 PT Time Calculation (min) (ACUTE ONLY): 39 min  Charges:  $Gait Training: 23-37 mins $Therapeutic Activity: 8-22 mins                     Jailen Lung R. PTA Acute Rehabilitation Services Office: [place] 06/24/2022, 11:54 AM

## 2022-06-24 NOTE — Care Management Important Message (Signed)
Important Message  Patient Details  Name: Carol Hardin MRN: [id number] Date of Birth: 1933/09/19   Medicare Important Message Given:  Yes     [name] 06/24/2022, 9:51 AM

## 2022-06-24 NOTE — Progress Notes (Deleted)
PROGRESS NOTE    Carol Hardin  AJG:[id number] DOB: [birth year] DOA: 06/20/2022 PCP: Celene Squibb, MD   Carol Hardin is a 88 y.o. female with medical history significant of chronic HFrEF EF 25%, MR, chronic A-fib, SSS s/p PPM, chronic hypoxic respiratory failure on at bedtime oxygen at home, presented with persistent hypotension and worsening of hypoxia. Patient started to have bilateral lower extremity swelling 2 to 3 weeks ago, went to see cardiology, and her CHF medication adjusted.  lisinopril was discontinued and patient was started on p.o. Lasix 20 mg daily.   Overnight, family took her pulse ox which was 70s.  In ED, BP soft, Afib w/ mild tachycardia, CXr w/ pulm vasc congestion, b/l pleural effusions, admitted   Subjective: -Tired, breathing a little better, diffuse pains  Assessment and Plan:  Acute on chronic systolic heart failure (HCC) Echocardiogram with reduced LV systolic function EF 35-59%, with global hypokinesis.  Moderate MR -Poor response to diuretics thus far, also limited by hypotension, history of longstanding orthostatic hypotension as well -Now in low output CHF state -Seen by cardiology, she is not a candidate for advanced cardiac therapies, inotropes etc. discussed poor prognosis with patient and family yesterday and today -Cardiology has told her nothing further to offer, I recommended DNR and discussion regarding hospice with palliative care -Continue to hold Coreg, increased midodrine dose -Discussed CODE STATUS again today, she is unable to make a decision  Atrial fibrillation, chronic ([place]) Patient with intermittent ventricular pacing. -Holding carvedilol with hypotension this morning, continue apixaban  Chronic hypoxic respiratory failure  -On home O2 2 L at baseline at bedtime  Acute kidney injury superimposed on chronic kidney disease ([place]) CKD stage 3b. Hyponatremia, hypokalemia  -Resolved  Moderate protein calorie malnutrition -Continue  supplements as tolerated  Sacral fracture (HCC) 09/04 sacral ala fracture.  -DC morphine, continue IV oxycodone, add laxatives  DVT prophylaxis: Apixaban Code Status: Full code, she will think about DNR Family Communication: Discussed with spouse and kids at bedside Disposition Plan: To be determined  Consultants: Cardiology   Procedures:   Antimicrobials:    Objective: Vitals:   06/23/22 2326 06/24/22 0000 06/24/22 0337 06/24/22 0710  BP: 1'05/68 92/65 95/71 '$ 99/75  Pulse: 71  90 87  Resp: '19  18 18  '$ Temp: 98.2 F (36.8 C)  98.2 F (36.8 C) (!) 97.5 F (36.4 C)  TempSrc: Oral  Oral Oral  SpO2: 95%  95% 97%  Weight:   53.2 kg   Height:        Intake/Output Summary (Last 24 hours) at 06/24/2022 1141 Last data filed at 06/24/2022 1030 Gross per 24 hour  Intake 997 ml  Output 760 ml  Net 237 ml   Filed Weights   06/22/22 0500 06/23/22 0406 06/24/22 0337  Weight: 41 kg 53.5 kg 53.2 kg    Examination:  General exam: Frail chronically ill elderly female sitting up in bed, AAOx3, no distress HEENT: Positive JVD CVS: S1-S2, regular rhythm, systolic murmur Lungs: Bilateral Rales noted Abdomen: Soft, nontender, nondistended, bowel sounds present Extremities: No edema Skin: No rashes Psychiatry: Flat affect    Data Reviewed:   CBC: Recent Labs  Lab 06/20/22 1053 06/24/22 0328  WBC 6.8 7.4  HGB 11.6* 11.0*  HCT 35.7* 34.4*  MCV 104.4* 105.5*  PLT 207 741   Basic Metabolic Panel: Recent Labs  Lab 06/20/22 1053 06/21/22 0431 06/22/22 0513 06/23/22 0504 06/24/22 0328  NA 133* 131* 135 136 136  K 4.6 3.7  3.2* 3.5 4.9  CL 102 100 101 102 102  CO2 '22 22 27 29 28  '$ GLUCOSE 103* 94 89 82 89  BUN 28* 31* '22 21 22  '$ CREATININE 1.01* 1.33* 1.20* 0.95 1.06*  CALCIUM 9.0 8.4* 8.2* 8.3* 8.6*   GFR: Estimated Creatinine Clearance: 31.5 mL/min (A) (by C-G formula based on SCr of 1.06 mg/dL (H)). Liver Function Tests: Recent Labs  Lab 06/20/22 1053  AST  47*  ALT 31  ALKPHOS 58  BILITOT 1.4*  PROT 7.9  ALBUMIN 2.9*   No results for input(s): "LIPASE", "AMYLASE" in the last 168 hours. No results for input(s): "AMMONIA" in the last 168 hours. Coagulation Profile: No results for input(s): "INR", "PROTIME" in the last 168 hours. Cardiac Enzymes: No results for input(s): "CKTOTAL", "CKMB", "CKMBINDEX", "TROPONINI" in the last 168 hours. BNP (last 3 results) No results for input(s): "PROBNP" in the last 8760 hours. HbA1C: No results for input(s): "HGBA1C" in the last 72 hours. CBG: No results for input(s): "GLUCAP" in the last 168 hours. Lipid Profile: No results for input(s): "CHOL", "HDL", "LDLCALC", "TRIG", "CHOLHDL", "LDLDIRECT" in the last 72 hours. Thyroid Function Tests: No results for input(s): "TSH", "T4TOTAL", "FREET4", "T3FREE", "THYROIDAB" in the last 72 hours. Anemia Panel: No results for input(s): "VITAMINB12", "FOLATE", "FERRITIN", "TIBC", "IRON", "RETICCTPCT" in the last 72 hours. Urine analysis:    Component Value Date/Time   COLORURINE YELLOW 08/20/2019 2047   APPEARANCEUR CLEAR 08/20/2019 2047   LABSPEC >1.046 (H) 08/20/2019 2047   PHURINE 6.0 08/20/2019 2047   GLUCOSEU NEGATIVE 08/20/2019 2047   HGBUR SMALL (A) 08/20/2019 2047   BILIRUBINUR negative 04/18/2021 1542   KETONESUR negative 04/18/2021 1542   KETONESUR 5 (A) 08/20/2019 2047   PROTEINUR >=300 (A) 04/18/2021 1542   PROTEINUR NEGATIVE 08/20/2019 2047   UROBILINOGEN 1.0 04/18/2021 1542   UROBILINOGEN 0.2 08/16/2008 2359   NITRITE Positive (A) 04/18/2021 1542   NITRITE NEGATIVE 08/20/2019 2047   LEUKOCYTESUR Small (1+) (A) 04/18/2021 1542   LEUKOCYTESUR NEGATIVE 08/20/2019 2047   Sepsis Labs: '@LABRCNTIP'$ (procalcitonin:4,lacticidven:4)  ) Recent Results (from the past 240 hour(s))  SARS Coronavirus 2 by RT PCR (hospital order, performed in [place] hospital lab) *cepheid single result test* Anterior Nasal Swab     Status: None   Collection  Time: 06/20/22 12:38 PM   Specimen: Anterior Nasal Swab  Result Value Ref Range Status   SARS Coronavirus 2 by RT PCR NEGATIVE NEGATIVE Final    Comment: (NOTE) SARS-CoV-2 target nucleic acids are NOT DETECTED.  The SARS-CoV-2 RNA is generally detectable in upper and lower respiratory specimens during the acute phase of infection. The lowest concentration of SARS-CoV-2 viral copies this assay can detect is 250 copies / mL. A negative result does not preclude SARS-CoV-2 infection and should not be used as the sole basis for treatment or other patient management decisions.  A negative result may occur with improper specimen collection / handling, submission of specimen other than nasopharyngeal swab, presence of viral mutation(s) within the areas targeted by this assay, and inadequate number of viral copies (<250 copies / mL). A negative result must be combined with clinical observations, patient history, and epidemiological information.  Fact Sheet for Patients:   https://www.patel.info/  Fact Sheet for Healthcare Providers: https://hall.com/  This test is not yet approved or  cleared by the [place] FDA and has been authorized for detection and/or diagnosis of SARS-CoV-2 by FDA under an Emergency Use Authorization (EUA).  This EUA will remain in effect (  meaning this test can be used) for the duration of the COVID-19 declaration under Section 564(b)(1) of the Act, 21 U.S.C. section 360bbb-3(b)(1), unless the authorization is terminated or revoked sooner.  Performed at [facility], [place] [address] [place], [place] [address]      Radiology Studies: No results found.   Scheduled Meds:  acetaminophen  325-975 mg Oral Daily   apixaban  2.5 mg Oral BID   feeding supplement  237 mL Oral BID BM   furosemide  20 mg Intravenous BID   LORazepam  0.5 mg Oral QHS   midodrine  10 mg Oral BID WC   multivitamin with minerals  1  tablet Oral Daily   senna-docusate  1 tablet Oral BID   sodium chloride flush  3 mL Intravenous Q12H   Continuous Infusions:  sodium chloride       LOS: 3 days    Time spent: 47mn  PDomenic Polite MD [facility]   06/24/2022, 11:41 AM

## 2022-06-24 NOTE — Progress Notes (Signed)
   06/24/22 1415  Mobility  Activity Ambulated with assistance in hallway  Level of Assistance Contact guard assist, steadying assist  Assistive Device Front wheel walker  Distance Ambulated (ft) 230 ft  Activity Response Tolerated well  $Mobility charge 1 Mobility   Mobility Specialist Progress Note  Received pt in bed having no complaints and agreeable to mobility. Pt was asymptomatic throughout ambulation and returned to room w/o fault. Left in bed w/ call bell in reach and all needs met.   Carol Hardin Mobility Specialist

## 2022-06-25 DIAGNOSIS — N179 Acute kidney failure, unspecified: Secondary | ICD-10-CM | POA: Diagnosis not present

## 2022-06-25 DIAGNOSIS — N189 Chronic kidney disease, unspecified: Secondary | ICD-10-CM | POA: Diagnosis not present

## 2022-06-25 DIAGNOSIS — I482 Chronic atrial fibrillation, unspecified: Secondary | ICD-10-CM | POA: Diagnosis not present

## 2022-06-25 DIAGNOSIS — I5023 Acute on chronic systolic (congestive) heart failure: Secondary | ICD-10-CM | POA: Diagnosis not present

## 2022-06-25 DIAGNOSIS — R52 Pain, unspecified: Secondary | ICD-10-CM

## 2022-06-25 DIAGNOSIS — Z66 Do not resuscitate: Secondary | ICD-10-CM

## 2022-06-25 DIAGNOSIS — F419 Anxiety disorder, unspecified: Secondary | ICD-10-CM

## 2022-06-25 DIAGNOSIS — E43 Unspecified severe protein-calorie malnutrition: Secondary | ICD-10-CM

## 2022-06-25 LAB — CBC
HCT: 34.6 % — ABNORMAL LOW (ref 36.0–46.0)
Hemoglobin: 11 g/dL — ABNORMAL LOW (ref 12.0–15.0)
MCH: 33.7 pg (ref 26.0–34.0)
MCHC: 31.8 g/dL (ref 30.0–36.0)
MCV: 106.1 fL — ABNORMAL HIGH (ref 80.0–100.0)
Platelets: 252 10*3/uL (ref 150–400)
RBC: 3.26 MIL/uL — ABNORMAL LOW (ref 3.87–5.11)
RDW: 15.5 % (ref 11.5–15.5)
WBC: 6.6 10*3/uL (ref 4.0–10.5)
nRBC: 0 % (ref 0.0–0.2)

## 2022-06-25 LAB — BASIC METABOLIC PANEL
Anion gap: 10 (ref 5–15)
BUN: 22 mg/dL (ref 8–23)
CO2: 29 mmol/L (ref 22–32)
Calcium: 9.2 mg/dL (ref 8.9–10.3)
Chloride: 97 mmol/L — ABNORMAL LOW (ref 98–111)
Creatinine, Ser: 1.12 mg/dL — ABNORMAL HIGH (ref 0.44–1.00)
GFR, Estimated: 48 mL/min — ABNORMAL LOW (ref >60)
Glucose, Bld: 92 mg/dL (ref 70–99)
Potassium: 4.4 mmol/L (ref 3.5–5.1)
Sodium: 136 mmol/L (ref 135–145)

## 2022-06-25 MED ORDER — HALOPERIDOL LACTATE 5 MG/ML IJ SOLN: 2.0000 mg | Freq: Four times a day (QID) | INTRAMUSCULAR | Status: DC | PRN

## 2022-06-25 MED ORDER — OXYCODONE HCL 5 MG PO TABS
5.0000 mg | ORAL_TABLET | ORAL | Status: DC | PRN
  Administered 2022-06-25: 5 mg via ORAL
  Filled 2022-06-25: qty 1

## 2022-06-25 MED ORDER — LORAZEPAM 2 MG/ML PO CONC: 1.0000 mg | ORAL | Status: DC | PRN

## 2022-06-25 MED ORDER — GLYCOPYRROLATE 1 MG PO TABS: 1.0000 mg | ORAL_TABLET | ORAL | Status: DC | PRN

## 2022-06-25 MED ORDER — ENSURE ENLIVE PO LIQD: 237.0000 mL | ORAL | Status: DC | PRN

## 2022-06-25 MED ORDER — BIOTENE DRY MOUTH MT LIQD
15.0000 mL | Freq: Two times a day (BID) | OROMUCOSAL | Status: DC
  Administered 2022-06-25: 15 mL via TOPICAL

## 2022-06-25 MED ORDER — GLYCOPYRROLATE 0.2 MG/ML IJ SOLN: 0.2000 mg | INTRAMUSCULAR | Status: DC | PRN

## 2022-06-25 MED ORDER — POLYVINYL ALCOHOL 1.4 % OP SOLN: 1.0000 [drp] | Freq: Four times a day (QID) | OPHTHALMIC | Status: DC | PRN

## 2022-06-25 MED ORDER — HALOPERIDOL LACTATE 2 MG/ML PO CONC: 2.0000 mg | Freq: Four times a day (QID) | ORAL | Status: DC | PRN

## 2022-06-25 MED ORDER — HALOPERIDOL 1 MG PO TABS: 2.0000 mg | ORAL_TABLET | Freq: Four times a day (QID) | ORAL | Status: DC | PRN

## 2022-06-25 MED ORDER — LORAZEPAM 1 MG PO TABS: 1.0000 mg | ORAL_TABLET | ORAL | Status: DC | PRN

## 2022-06-25 MED ORDER — LORAZEPAM 1 MG PO TABS
1.0000 mg | ORAL_TABLET | Freq: Every day | ORAL | Status: DC
  Administered 2022-06-25: 1 mg via ORAL
  Filled 2022-06-25: qty 1

## 2022-06-25 MED ORDER — DIPHENHYDRAMINE HCL 50 MG/ML IJ SOLN: 12.5000 mg | INTRAMUSCULAR | Status: DC | PRN

## 2022-06-25 MED ORDER — LORAZEPAM 2 MG/ML IJ SOLN: 1.0000 mg | INTRAMUSCULAR | Status: DC | PRN

## 2022-06-25 MED ORDER — POLYETHYLENE GLYCOL 3350 17 G PO PACK: 17.0000 g | PACK | Freq: Every day | ORAL | Status: DC | PRN

## 2022-06-25 NOTE — TOC Progression Note (Signed)
Transition of Care [facility]) - Progression Note    Patient Details  Name: Carol Hardin MRN: [id number] Date of Birth: [birth year]  Transition of Care [facility]) CM/SW Contact  Zenon Mayo, RN Phone Number: 06/25/2022, 12:41 PM  Clinical Narrative:    Patient is set up with [facility], [place] made referral to [facility], gave Tammy son's phone number to call to set up DME for patient, she will need ambulance transport to home at dc, once all DME has been delivered.    Expected Discharge Plan: Home w Hospice Care Barriers to Discharge: Equipment Delay  Expected Discharge Plan and Services Expected Discharge Plan: [place]   Discharge Planning Services: CM Consult Post Acute Care Choice: NA Living arrangements for the past 2 months: Single Family Home                           HH Arranged: RN [place] Date [place]: 06/25/22 Time Ages: 1239 Representative spoke with at [place]: [place] Determinants of Health ([place]) Interventions    Readmission Risk Interventions     No data to display

## 2022-06-25 NOTE — Progress Notes (Signed)
PROGRESS NOTE    Carol Hardin  CVE:[id number] DOB: [birth year] DOA: 06/20/2022 PCP: Celene Squibb, MD   Carol Hardin is a 88 y.o. female with medical history significant of chronic HFrEF EF 25%, MR, chronic A-fib, SSS s/p PPM, chronic hypoxic respiratory failure on at bedtime oxygen at home, presented with persistent hypotension and worsening of hypoxia. Patient started to have bilateral lower extremity swelling 2 to 3 weeks ago, went to see cardiology, and her CHF medication adjusted.  lisinopril was discontinued and patient was started on p.o. Lasix 20 mg daily.   Overnight, family took her pulse ox which was 70s.  In ED, BP soft, Afib w/ mild tachycardia, CXr w/ pulm vasc congestion, b/l pleural effusions, admitted   Subjective: -Tired, breathing a little better, diffuse pains  Assessment and Plan:  Acute on chronic systolic heart failure (HCC) Echocardiogram with reduced LV systolic function EF 02-58%, with global hypokinesis.  Moderate MR -Poor response to diuretics thus far, also limited by hypotension, history of longstanding orthostatic hypotension as well -Now in low output CHF state, on high-dose midodrine -Seen by cardiology, she is not a candidate for advanced cardiac therapies, inotropes etc. discussed poor prognosis with patient and family on multiple occasions now -Cardiology has told her yesterday nothing further to offer, I strongly recommended home with hospice and DNR -For palliative care meeting with family today, her family understands very poor prognosis  Atrial fibrillation, chronic ([place]) Patient with intermittent ventricular pacing. -Holding carvedilol with hypotension, continue apixaban  Chronic hypoxic respiratory failure  -On home O2 2 L at baseline at bedtime  Acute kidney injury superimposed on chronic kidney disease ([place]) CKD stage 3b. Hyponatremia, hypokalemia  -Resolved  Moderate protein calorie malnutrition -Continue supplements as  tolerated  Sacral fracture (HCC) 09/04 sacral ala fracture.  -DC morphine, continue IV oxycodone, add laxatives  DVT prophylaxis: Apixaban Code Status: Full code, she will think about DNR Family Communication: Discussed with son at bedside disposition Plan: To be determined, home with hospice recommended  Consultants: Cardiology   Procedures:   Antimicrobials:    Objective: Vitals:   06/24/22 2204 06/25/22 0309 06/25/22 0314 06/25/22 0728  BP: 101/79  96/80 97/71  Pulse: 89  84 60  Resp:   20 20  Temp:   97.6 F (36.4 C) 97.6 F (36.4 C)  TempSrc:   Oral Oral  SpO2:   91% 96%  Weight:  53.5 kg    Height:        Intake/Output Summary (Last 24 hours) at 06/25/2022 1009 Last data filed at 06/25/2022 0830 Gross per 24 hour  Intake 540 ml  Output 800 ml  Net -260 ml   Filed Weights   06/23/22 0406 06/24/22 0337 06/25/22 0309  Weight: 53.5 kg 53.2 kg 53.5 kg    Examination:  General exam: Frail chronically ill elderly female sitting up in bed, AAOx3, no distress HEENT: Positive JVD CVS: S1-S2, regular rhythm, systolic murmur Lungs: Bilateral Rales noted Abdomen: Soft, nontender, nondistended, bowel sounds present Extremities: No edema Skin: No rashes Psychiatry: Flat affect    Data Reviewed:   CBC: Recent Labs  Lab 06/20/22 1053 06/24/22 0328 06/25/22 0427  WBC 6.8 7.4 6.6  HGB 11.6* 11.0* 11.0*  HCT 35.7* 34.4* 34.6*  MCV 104.4* 105.5* 106.1*  PLT 207 245 527   Basic Metabolic Panel: Recent Labs  Lab 06/21/22 0431 06/22/22 0513 06/23/22 0504 06/24/22 0328 06/25/22 0427  NA 131* 135 136 136 136  K 3.7 3.2*  3.5 4.9 4.4  CL 100 101 102 102 97*  CO2 '22 27 29 28 29  '$ GLUCOSE 94 89 82 89 92  BUN 31* '22 21 22 22  '$ CREATININE 1.33* 1.20* 0.95 1.06* 1.12*  CALCIUM 8.4* 8.2* 8.3* 8.6* 9.2   GFR: Estimated Creatinine Clearance: 29.8 mL/min (A) (by C-G formula based on SCr of 1.12 mg/dL (H)). Liver Function Tests: Recent Labs  Lab 06/20/22 1053   AST 47*  ALT 31  ALKPHOS 58  BILITOT 1.4*  PROT 7.9  ALBUMIN 2.9*   No results for input(s): "LIPASE", "AMYLASE" in the last 168 hours. No results for input(s): "AMMONIA" in the last 168 hours. Coagulation Profile: No results for input(s): "INR", "PROTIME" in the last 168 hours. Cardiac Enzymes: No results for input(s): "CKTOTAL", "CKMB", "CKMBINDEX", "TROPONINI" in the last 168 hours. BNP (last 3 results) No results for input(s): "PROBNP" in the last 8760 hours. HbA1C: No results for input(s): "HGBA1C" in the last 72 hours. CBG: No results for input(s): "GLUCAP" in the last 168 hours. Lipid Profile: No results for input(s): "CHOL", "HDL", "LDLCALC", "TRIG", "CHOLHDL", "LDLDIRECT" in the last 72 hours. Thyroid Function Tests: No results for input(s): "TSH", "T4TOTAL", "FREET4", "T3FREE", "THYROIDAB" in the last 72 hours. Anemia Panel: No results for input(s): "VITAMINB12", "FOLATE", "FERRITIN", "TIBC", "IRON", "RETICCTPCT" in the last 72 hours. Urine analysis:    Component Value Date/Time   COLORURINE YELLOW 08/20/2019 2047   APPEARANCEUR CLEAR 08/20/2019 2047   LABSPEC >1.046 (H) 08/20/2019 2047   PHURINE 6.0 08/20/2019 2047   GLUCOSEU NEGATIVE 08/20/2019 2047   HGBUR SMALL (A) 08/20/2019 2047   BILIRUBINUR negative 04/18/2021 1542   KETONESUR negative 04/18/2021 1542   KETONESUR 5 (A) 08/20/2019 2047   PROTEINUR >=300 (A) 04/18/2021 1542   PROTEINUR NEGATIVE 08/20/2019 2047   UROBILINOGEN 1.0 04/18/2021 1542   UROBILINOGEN 0.2 08/16/2008 2359   NITRITE Positive (A) 04/18/2021 1542   NITRITE NEGATIVE 08/20/2019 2047   LEUKOCYTESUR Small (1+) (A) 04/18/2021 1542   LEUKOCYTESUR NEGATIVE 08/20/2019 2047   Sepsis Labs: '@LABRCNTIP'$ (procalcitonin:4,lacticidven:4)  ) Recent Results (from the past 240 hour(s))  SARS Coronavirus 2 by RT PCR (hospital order, performed in [facility] hospital lab) *cepheid single result test* Anterior Nasal Swab     Status: None    Collection Time: 06/20/22 12:38 PM   Specimen: Anterior Nasal Swab  Result Value Ref Range Status   SARS Coronavirus 2 by RT PCR NEGATIVE NEGATIVE Final    Comment: (NOTE) SARS-CoV-2 target nucleic acids are NOT DETECTED.  The SARS-CoV-2 RNA is generally detectable in upper and lower respiratory specimens during the acute phase of infection. The lowest concentration of SARS-CoV-2 viral copies this assay can detect is 250 copies / mL. A negative result does not preclude SARS-CoV-2 infection and should not be used as the sole basis for treatment or other patient management decisions.  A negative result may occur with improper specimen collection / handling, submission of specimen other than nasopharyngeal swab, presence of viral mutation(s) within the areas targeted by this assay, and inadequate number of viral copies (<250 copies / mL). A negative result must be combined with clinical observations, patient history, and epidemiological information.  Fact Sheet for Patients:   https://www.patel.info/  Fact Sheet for Healthcare Providers: https://hall.com/  This test is not yet approved or  cleared by the [place] FDA and has been authorized for detection and/or diagnosis of SARS-CoV-2 by FDA under an Emergency Use Authorization (EUA).  This EUA will remain in effect (  meaning this test can be used) for the duration of the COVID-19 declaration under Section 564(b)(1) of the Act, 21 U.S.C. section 360bbb-3(b)(1), unless the authorization is terminated or revoked sooner.  Performed at [facility], [place] [address] [place], [place] [address]      Radiology Studies: No results found.   Scheduled Meds:  acetaminophen  325-975 mg Oral Daily   apixaban  2.5 mg Oral BID   feeding supplement  237 mL Oral BID BM   furosemide  20 mg Intravenous BID   LORazepam  0.5 mg Oral QHS   midodrine  10 mg Oral BID WC   multivitamin with  minerals  1 tablet Oral Daily   senna-docusate  1 tablet Oral BID   sodium chloride flush  3 mL Intravenous Q12H   Continuous Infusions:  sodium chloride       LOS: 4 days    Time spent: 66mn  PDomenic Polite MD [facility]   06/25/2022, 10:09 AM

## 2022-06-25 NOTE — TOC Progression Note (Addendum)
Transition of Care [facility]) - Progression Note    Patient Details  Name: Carol Hardin MRN: [id number] Date of Birth: [birth year]  Transition of [facility]) CM/SW Contact  Zenon Mayo, RN Phone Number: 06/25/2022, 12:22 PM  Clinical Narrative:    NCM received consult from Palliative that family wants home hospice for patient with [facility].  NCM confirmed the hospice choice with sons in the room. Son, Shirlean Mylar gave NCM a list of DME they will need .  NCM made referral to Tammy with [facility], the DME they will need is a lift chair, hospital bed, bsc, tray, oxygen, purewick, pill crusher, blood pressure monitor, scale, high toilet seat an light.  NCM gave this information to [place] with [facility].  Patient will need ambulance transport home at dc. Address confirmed.         Expected Discharge Plan and Services                                                 Social Determinants of Health (SDOH) Interventions    Readmission Risk Interventions     No data to display

## 2022-06-25 NOTE — Progress Notes (Signed)
Occupational Therapy Treatment Patient Details Name: Carol Hardin MRN: [id number] DOB: [birth year] Today's Date: 06/25/2022   History of present illness 88 yo female admitted 9/10 with hypoxia and hypotension. PMhx: 9/4 fall in yard with visit to ED with nondisplaced sacral ala fx. HFrEF, Afib, SSS s/p PPM, chronic respiratory failure on nocturnal O2, HLD, GERD, depression   OT comments  Unfortunately pt with poor medical prognosis. OT notified just after this last OT treatment that pt will be discharging home with hospice and now on Comfort care for remainder of hospital stay. OT order discontinued by Medical team.   This session, Pt verbalized goal to be able to have a bowel movement reporting 5 days of constripation and increasingly feeling sick to stomach. Pt's sons encouraged mobility in hopes of bowels moving. Pt agreeable to ambulated in hallway ~65' with RW, then transferred to commode. Pt able to void bladder (had been unable to in bed on pure wick) but unsuccessful with bowels moving. Pt agreed to sit up in recliner, and positioned with pillows for sacral comfort.  At this time OT to sign off.    Recommendations for follow up therapy are one component of a multi-disciplinary discharge planning process, led by the attending physician.  Recommendations may be updated based on patient status, additional functional criteria and insurance authorization.    Follow Up Recommendations  No OT follow up    Assistance Recommended at Discharge Frequent or constant Supervision/Assistance  Patient can return home with the following  A little help with walking and/or transfers;Assistance with cooking/housework;Direct supervision/assist for medications management;Direct supervision/assist for financial management;Assist for transportation;Help with stairs or ramp for entrance;A little help with bathing/dressing/bathroom   Equipment Recommendations  Hospital bed (Hospice to provide hospital  bed.)    Recommendations for Other Services      Precautions / Restrictions Precautions Precautions: Fall Restrictions Weight Bearing Restrictions: No       Mobility Bed Mobility Overal bed mobility: Needs Assistance Bed Mobility: Supine to Sit     Supine to sit: Supervision, HOB elevated (increased time/effort)          Transfers                         Balance Overall balance assessment: Needs assistance, History of Falls Sitting-balance support: Feet supported, No upper extremity supported Sitting balance-Leahy Scale: Good     Standing balance support: During functional activity, Reliant on assistive device for balance Standing balance-Leahy Scale: Poor Standing balance comment: RW for gait, recent fall, stooped over                           ADL either performed or assessed with clinical judgement   ADL Overall ADL's : Needs assistance/impaired   Eating/Feeding Details (indicate cue type and reason): Pt declining lunch due to 5 days of constipation and now feeling sick to stomach. Grooming: Min Development worker, international aid Details (indicate cue type and reason): standing at sink, cues for positioning RW anterior to sink for safety.             Lower Body Dressing: Sit to/from stand;Minimal assistance Lower Body Dressing Details (indicate cue type and reason): donning/doffing mesh underwear before and after toileting. Toilet Transfer: Minimal assistance;Regular Toilet;Grab bars;Cueing for safety;Cueing for sequencing Toilet Transfer Details (indicate cue type and reason): Pt stood from EOB with Min-Mod As and retropulsive. Cues for anterior weight shift. Increased effort and Min  As to descend to toilet with cues for grab bar. Pt able to perform "nose over toes" better with need of light Min As to rise from toilet. Pt ambulated with RW to sink then to EOB for reat break. Pt stood from EOB but retropulsive again with need of heavier  Min As. Reminded of technique. Pt ambulated with RW around her bed to recliner. Lowered to recliner with Min As.  Pt stood one additional time from recliner to place pillows due to tailbone pain. Pt reported increased comfort with pillows and reclining. Toileting- Clothing Manipulation and Hygiene: Sitting/lateral lean;Min guard Toileting - Clothing Manipulation Details (indicate cue type and reason): Min guard for trunk as pt leaned for peri care.     Functional mobility during ADLs: Rolling walker (2 wheels);Min guard;Minimal assistance      Extremity/Trunk Assessment Upper Extremity Assessment Upper Extremity Assessment: Generalized weakness   Lower Extremity Assessment Lower Extremity Assessment: Generalized weakness   Cervical / Trunk Assessment Cervical / Trunk Assessment: Kyphotic    Vision   Vision Assessment?: No apparent visual deficits   Perception     Praxis      Cognition Arousal/Alertness: Awake/alert Behavior During Therapy: WFL for tasks assessed/performed, Flat affect Overall Cognitive Status: Within Functional Limits for tasks assessed                                          Exercises      Shoulder Instructions       General Comments      Pertinent Vitals/ Pain       Pain Assessment Pain Assessment: Faces Faces Pain Scale: Hurts even more Pain Location: Stomach (constipation), Ribs on RT, tailbone. Pain Descriptors / Indicators: Discomfort, Grimacing Pain Intervention(s): Repositioned, Relaxation, Premedicated before session, Monitored during session, Limited activity within patient's tolerance  Home Living                                          Prior Functioning/Environment              Frequency           Progress Toward Goals  OT Goals(current goals can now be found in the care plan section)  Progress towards OT goals: Not progressing toward goals - comment  Acute Rehab OT Goals Patient  Stated Goal: Going home with hospice. Pt's 3 sons present in room and preparing all home needs. OT Goal Formulation: All assessment and education complete, DC therapy  Plan Other (comment) (OT signing off with OT order discontinued. Pt Comfort Care and going to home hospice.)    Co-evaluation                 AM-PAC OT "6 Clicks" Daily Activity     Outcome Measure   Help from another person eating meals?: A Little Help from another person taking care of personal grooming?: A Little Help from another person toileting, which includes using toliet, bedpan, or urinal?: A Little Help from another person bathing (including washing, rinsing, drying)?: A Little Help from another person to put on and taking off regular upper body clothing?: A Little Help from another person to put on and taking off regular lower body clothing?: A Little 6 Click Score: 18    End of Session  Equipment Utilized During Treatment: Rolling walker (2 wheels);Oxygen;Gait belt      Activity Tolerance Patient limited by fatigue;Patient limited by pain   Patient Left in chair;with call bell/phone within reach;with family/visitor present   Nurse Communication Other (comment) (After OT session, recevied notification that pt moving to Home hospice and is now comfort care.)        Time: 1202-1240 OT Time Calculation (min): 38 min  Charges: OT General Charges $OT Visit: 1 Visit OT Treatments $Self Care/Home Management : 8-22 mins $Therapeutic Activity: 23-37 mins  Anderson [place], [place] Office: [phone] 06/25/2022  Julien Girt 06/25/2022, 1:33 PM

## 2022-06-25 NOTE — Progress Notes (Signed)
Daily Progress Note   Patient Name: Carol Hardin       Date: 06/25/2022 DOB: 01-27-1933  Age: 88 y.o. MRN#: [id number] Attending Physician: Domenic Polite, MD Primary Care Physician: Celene Squibb, MD Admit Date: 06/20/2022  Reason for Consultation/Follow-up: Establishing goals of care  Subjective: Chart review performed. Received report from primary RN - no acute concerns. RN reports patient seems weaker today.   Went to visit patient at bedside - spouse/Herman, son/Gray, son/Robin, son/Shawn present. Collins Scotland is the step-father of patient's sons. Patient was lying in bed asleep - she does wake to voice/gentle touch. No signs or non-verbal gestures of pain or discomfort noted. No respiratory distress, increased work of breathing, or secretions noted. She is on 2L O2 .   Emotional support provided to patient at family. Patient was fell asleep for beginning of visit.   Met with patient and family  to discuss diagnosis, prognosis, GOC, EOL wishes, disposition, and options.  I introduced Palliative Medicine as specialized medical care for people living with serious illness. It focuses on providing relief from the symptoms and stress of a serious illness. The goal is to improve quality of life for both the patient and the family.  We discussed a brief life review of the patient as well as functional and nutritional status. Patient is married - she has 4 sons. Prior to hospitalization, she was living in a private residence with her husband. She was functionally independent - used a walker occasionally. She was able to dress and bathe herself. She had no home health services. Husband states that her appetite was "good" until recently. He first started to notice her decline about 6 months ago;  however, noticed a significant decline 2 weeks ago after her fall.   I woke patient for involvement in next part of conversation. We discussed patient's current illness and what it means in the larger context of patient's on-going co-morbidities. Patient and family understand that CHF is a progressive, non-curable disease underlying the patient's current acute medical conditions, for which she has reached end stages. Interval history since her admission was reviewed. Patient and family understand she has had a poor response to diuretics, which is also limited by her hypotension, in context of worsening heart failure. Reviewed she is not a candidate for advanced heart therapies and medically, there  is not much else that can be offered. Natural disease trajectory and expectations at EOL were discussed. I attempted to elicit values and goals of care important to the patient. The difference between aggressive medical intervention and comfort care was considered in light of the patient's goals of care. We reviewed that no matter which path is chosen (aggressive vs comfort) her time is limited.  Provided education and counseling at length on the philosophy and benefits of hospice care. Discussed that it offers a holistic approach to care in the setting of end-stage illness, and is about supporting the patient where they are allowing nature to take it's course. Discussed the hospice team includes RNs, physicians, social workers, and chaplains. They can provide personal care, support for the family, and help keep patient out of the hospital as well as assist with DME needs for home hospice. Education provided on the difference between home vs residential hospice.   Allowed space and time for patient and family to discuss information. Ultimately, they would like patient to return home with hospice; knowing she can transfer to hospice facility in the future if needed. DME discussed. Family request [facility].   We talked about transition to comfort measures in house and what that would entail inclusive of medications to control pain, dyspnea, agitation, nausea, and itching. We discussed stopping all unnecessary measures such as blood draws, needle sticks, oxygen, antibiotics, CBGs/insulin, cardiac monitoring, IVF, and frequent vital signs. Patient is agreeable to full comfort measures today.  Encouraged patient to consider DNR/DNI status understanding evidenced based poor outcomes in similar hospitalized patient, as the cause of arrest is likely associated with advanced chronic/terminal illness rather than an easily reversible acute cardio-pulmonary event. I explained that DNR/DNI does not change the medical plan and it only comes into effect after a person has arrested (died).  It is a protective measure to keep [place] from harming the patient in their last moments of life. Patient was agreeable to DNR/DNI with understanding that she would not receive CPR, defibrillation, ACLS medications, or intubation.   Visit also consisted of discussions dealing with the complex and emotionally intense issues of symptom management and palliative care in the setting of serious and potentially life-threatening illness.   Patient reports anxiety at bedtime and no BM in 5 days  Discussed with patient/family the importance of continued conversation with each other and the medical providers regarding overall plan of care and treatment options, ensuring decisions are within the context of the patient's values and GOCs.    All questions and concerns addressed. Encouraged to call with questions and/or concerns. PMT card provided.  Length of Stay: 4  Current Medications: Scheduled Meds:   acetaminophen  325-975 mg Oral Daily   apixaban  2.5 mg Oral BID   feeding supplement  237 mL Oral BID BM   furosemide  20 mg Intravenous BID   LORazepam  0.5 mg Oral QHS   midodrine  10 mg Oral BID WC   multivitamin with minerals   1 tablet Oral Daily   senna-docusate  1 tablet Oral BID   sodium chloride flush  3 mL Intravenous Q12H    Continuous Infusions:  sodium chloride      PRN Meds: sodium chloride, acetaminophen, albuterol, azelastine, ondansetron (ZOFRAN) IV, oxyCODONE, sodium chloride flush  Physical Exam Vitals and nursing note reviewed.  Constitutional:      General: She is not in acute distress.    Appearance: She is ill-appearing.  Pulmonary:  Effort: No respiratory distress.  Skin:    General: Skin is warm and dry.  Neurological:     Mental Status: She is alert and oriented to person, place, and time.     Motor: Weakness present.  Psychiatric:        Attention and Perception: Attention normal.        Behavior: Behavior is cooperative.        Cognition and Memory: Cognition and memory normal.             Vital Signs: BP 97/71 (BP Location: Left Arm)   Pulse 60   Temp 97.6 F (36.4 C) (Oral)   Resp 20   Ht _0  (1.6 m)   Wt 53.5 kg   SpO2 96%   BMI 20.89 kg/m  SpO2: SpO2: 96 % O2 Device: O2 Device: Nasal Cannula O2 Flow Rate: O2 Flow Rate (L/min): 2 L/min  Intake/output summary:  Intake/Output Summary (Last 24 hours) at 06/25/2022 1023 Last data filed at 06/25/2022 0830 Gross per 24 hour  Intake 540 ml  Output 800 ml  Net -260 ml   LBM: Last BM Date : 06/21/22 Baseline Weight: Weight: 61.4 kg Most recent weight: Weight: 53.5 kg       Palliative Assessment/Data: PPS 50%      Patient Active Problem List   Diagnosis Date Noted   Protein-calorie malnutrition, severe 06/23/2022   Sacral fracture ([place]) 06/21/2022   Heart failure ([place]) 06/21/2022   CHF (congestive heart failure) ([place]) 06/20/2022   Orthostatic hypotension 06/04/2022   Chronic rhinitis 05/23/2022   Edema 05/23/2022   Pruritus of scalp 05/23/2022   Aortic atherosclerosis ([place]) 12/15/2021   Frequent stools 03/24/2021   Abdominal pain 03/24/2021   Loss of weight 03/24/2021   Osteoporosis  08/26/2020   History of revision of total replacement of right hip joint 08/26/2020   Hemorrhoids 08/26/2020   Fracture of proximal phalanx of finger 07/02/2020   Disorder of skeletal muscle 01/07/2020   Muscular deconditioning 01/07/2020   Dysphagia 03/30/2019   Constipation 03/30/2019   Chronic hypoxemic respiratory failure ([place])    Oxygen dependent    Cardiac pacemaker in situ 01/23/2019   Sick sinus syndrome ([place]) 01/22/2019   Acute kidney injury superimposed on chronic kidney disease ([place]) 01/21/2019   Acute on chronic systolic heart failure ([place]) 12/25/2018   Tachy-brady syndrome ([place]) 12/25/2018   Atrial fibrillation, chronic ([place]) 12/24/2018   Congestive heart failure ([place]) 12/24/2018   Chronic atrial fibrillation ([place]) 12/24/2018   Malnutrition, calorie ([place]) 11/20/2018   Atrial fibrillation with rapid ventricular response ([place]) 11/18/2018   Community acquired pneumonia 11/18/2018   Acute on chronic diastolic heart failure ([place]) 11/18/2018   Trochanteric bursitis of left hip 05/23/2018   Stroke (cerebrum) ([place]) 05/19/2018   Hip fracture ([place]) 12/31/2017   Foreign body of both ears 07/01/2017   Presbycusis of both ears 07/01/2017   PAF (paroxysmal atrial fibrillation) (HCC)    Nonsustained ventricular tachycardia (HCC)    Mitral valve regurgitation    Ventricular premature beats    Bradycardia    Essential hypertension    Hypokalemia    Diverticulitis 08/09/2016   Gastroesophageal reflux disease 07/26/2016   Globus pharyngeus 07/26/2016   Hoarseness 07/26/2016   Diverticulosis of colon 04/22/2016   Diverticulosis of large intestine 04/22/2016   Generalized anxiety disorder 04/22/2016   Irritable bowel syndrome 04/22/2016   Mitral valve prolapse 04/22/2016   Mixed hyperlipidemia 04/22/2016   Osteopenia 04/22/2016   Diverticulitis of large intestine  04/22/2016   Low blood pressure 09/24/2014   Hypercholesterolemia    History of depression    Shortness of breath  05/31/2008    Palliative Care Assessment & Plan   Patient Profile: 88 y.o. female  with past medical history of  chronic HFrEF (recent echo showed EF 25%), chronic A-fib, sick sinus syndrome status post bi-chamber PPM, chronic hypoxic respiratory failure on bedtime oxygen at home presented to ED on 06/20/22 from home with worsening hypoxia/shortness of breath, worsening pain, and hypotension. Patient was admitted on 06/20/2022 with acute on chronic HFrEF decompensation, afib with RVR, acute hypoxic respiratory failure, moderate protein calorie malnutrition, recent sacral fracture.   Assessment: Principal Problem:   Acute on chronic systolic heart failure (HCC) Active Problems:   Malnutrition, calorie (HCC)   Atrial fibrillation, chronic (HCC)   Acute kidney injury superimposed on chronic kidney disease ([place])   Sacral fracture (HCC)   Protein-calorie malnutrition, severe   Terminal care  Recommendations/Plan: Initiated full comfort measures Now DNR/DNI - durable DNR form completed and placed in shadow chart. Copy was made and will be scanned into Vynca/ACP tab Patient/family requesting home hospice with [facility] notified and consult placed. Patient/family will be ready for discharge once DME delivered to home Added orders for EOL symptom management and to reflect full comfort measures, as well as discontinued orders that were not focused on comfort Unrestricted visitation orders were placed per current [place] EOL visitation policy  Nursing to provide frequent assessments and administer PRN medications as clinically necessary to ensure EOL comfort PMT will continue to follow and support holistically  Symptom Management Increased roxicodone 5-84m q4h PRN pain/distress/dyspnea Increased bedtime dose of ativan to 167mContinue midodrine with goal to keep patient stable for discharge home with hospice - no escalation Miralax PRN constipation; continue  senna-docusate daily Continue albuterol PRN and scheduled lasix Tylenol PRN pain/fever Biotin twice daily Benadryl PRN itching Robinul PRN secretions Haldol PRN agitation/delirium Ativan PRN anxiety/seizure/sleep/distress Zofran PRN nausea/vomiting Liquifilm Tears PRN dry eye   Goals of Care and Additional Recommendations: Limitations on Scope of Treatment: Full Comfort Care  Code Status:    Code Status Orders  (From admission, onward)           Start     Ordered   06/20/22 1423  Full code  Continuous        06/20/22 1423           Code Status History     Date Active Date Inactive Code Status Order ID Comments User Context   01/20/2019 2140 01/24/2019 1647 Full Code [id number] MainlandDO ED   12/24/2018 1619 12/29/2018 1705 Full Code [id number] DeerMD ED   11/18/2018 1800 11/22/2018 1553 Full Code [id number] DuboisMD ED   05/19/2018 1920 05/20/2018 2201 Full Code [id number] DikeMD Inpatient   12/31/2017 1739 01/02/2018 2135 Full Code [id number] FordDO ED   08/09/2016 1735 08/12/2016 1533 Full Code [id number] JumboPA-C ED       Prognosis:  < 6 weeks  Discharge Planning: Home with Hospice  Care plan was discussed with primary RN, patient, patient's family, TOC, Dr. JoBroadus JohnThank you for allowing the Palliative Medicine Team to assist in the care of this patient.   Total Time 120 minutes Prolonged Time Billed  yes       Greater than 50%  of this time was  spent counseling and coordinating care related to the above assessment and plan.  Lin Landsman, NP  Please contact Palliative Medicine Team phone at [phone] for questions and concerns.   *Portions of this note are a verbal dictation therefore any spelling and/or grammatical errors are due to the "[place] [employer]" system interpretation.

## 2022-06-26 MED ORDER — LORAZEPAM 1 MG PO TABS: 1.0000 mg | ORAL_TABLET | ORAL | 0 refills | Status: AC | PRN

## 2022-06-26 MED ORDER — OXYCODONE HCL 5 MG PO TABS: 5.0000 mg | ORAL_TABLET | ORAL | 0 refills | Status: AC | PRN

## 2022-06-26 MED ORDER — MIDODRINE HCL 10 MG PO TABS: 10.0000 mg | ORAL_TABLET | Freq: Two times a day (BID) | ORAL | 0 refills | Status: AC

## 2022-06-26 NOTE — Progress Notes (Signed)
   06/25/22 1950  Clinical Encounter Type  Visited With Patient and family together;Health care provider (HUSBAND: Collins Scotland; SON: Shirlean Mylar; NURSE: Woodroe Chen, RN)  Visit Type Initial;Spiritual support (Transitioning to [place])  Referral From Nurse  Consult/Referral To Chaplain Melvenia Beam)  Recommendations Transitioning Comfort Care  Spiritual Encounters  Spiritual Needs Emotional;Grief support   Patient's nurse requested Spiritual Care visit to support patient and family as she transitions to [place]. Chaplain met with [name]; Husband/Carol Hardin; and Son/ Carol Hardin at patient's bedside. Provided Emotional and Spiritual Support to [name] and family as they shared brief life review. Chaplain elicited patient and family to share conversation of fears and anxiety relating to transition into home hospice and end of life stages. Patient is very aware that she is surrounded by a family that deeply loves and cares for her.  [address] [place], [place] [phone]

## 2022-06-26 NOTE — TOC Transition Note (Addendum)
Transition of Care [facility]) - CM/SW Discharge Note   Patient Details  Name: [name] MRN: [id number] Date of Birth: [birth year]  [facility] CM/SW Contact:  Carles Collet, RN Phone Number: 06/26/2022, 11:14 AM   Clinical Narrative:     Solon [place] with [place] that patient will DC today. Verified with spouse at bedside that patient has home oxygen and all DME is set up and family is comfortable with patient returning to home today. Provided with resources for private duty care. Verified address. PTAR forms and DNR placed on chart. PTAR called  Final next level of care: Home w Hospice Care Barriers to Discharge: No Barriers Identified   Patient Goals and CMS Choice Patient states their goals for this hospitalization and ongoing recovery are:: return home with hospice CMS Medicare.gov Compare Post Acute Care list provided to:: Patient Represenative (must comment) Choice offered to / list presented to : Adult Children  Discharge Placement                       Discharge Plan and Services   Discharge Planning Services: CM Consult Post Acute Care Choice: NA                    HH Arranged: RN [facility] Agency: [facility] Date [place]: 06/26/22 Time [place]: 1114 Representative spoke with at [place]: [place] Determinants of Health ([place]) Interventions     Readmission Risk Interventions     No data to display

## 2022-06-26 NOTE — Progress Notes (Addendum)
Daily Progress Note   Patient Name: Carol Hardin       Date: 06/26/2022 DOB: 1933-03-04  Age: 88 y.o. MRN#: [id number] Attending Physician: Domenic Polite, MD Primary Care Physician: Celene Squibb, MD Admit Date: 06/20/2022  Reason for Consultation/Follow-up: Non pain symptom management, Pain control, Psychosocial/spiritual support, and Terminal Care  Subjective: Chart review performed. Received report from primary RN - no acute concerns.  Went to visit patient at bedside - husband/Herman, son, Pearline Cables, DIL/Benita present. Patient was lying in bed intermittently awake/asleep - she does easily arouse to voice/gently touch. No signs or non-verbal gestures of pain or discomfort noted. No respiratory distress, increased work of breathing, or secretions noted. Patient sleepy better last night with increase in ativan. She still is trying to have BM - noted no miralax was given yesterday - notified nurse to administer today.  Eileen Stanford tells me patient attempted to walk to BR unsuccessfully due to weakness; BSC was utilized. She quietly expresses concern  patient's husband will need assistance at home; reviewed with her conversation with family yesterday and their understanding of patient's weakness and decision to still bring her home. Offered TOC assistance with private duty caregiver information again - she and Pearline Cables were agreeable. Again reviewed, if patient goes home with hospice, they can still transition her to residential hospice if needed. Reviewed how to make this request.   Family confirm all needed DME has been delivered and they ready for patient's return home today; patient also expresses her readiness to return home.  [employer] Book provided.  Husband expresses happiness that he and  patient will be able to watch movies together at home.  All questions and concerns addressed. Encouraged to call with questions and/or concerns. PMT card provided.  Notified TOC of family request for private duty caregiver information.  Length of Stay: 5  Current Medications: Scheduled Meds:   antiseptic oral rinse  15 mL Topical BID   apixaban  2.5 mg Oral BID   furosemide  20 mg Intravenous BID   LORazepam  1 mg Oral QHS   midodrine  10 mg Oral BID WC   senna-docusate  1 tablet Oral BID    Continuous Infusions:   PRN Meds: albuterol, azelastine, diphenhydrAMINE, feeding supplement, glycopyrrolate **OR** glycopyrrolate **OR** glycopyrrolate, haloperidol **OR** haloperidol **OR** haloperidol lactate, LORazepam **  OR** LORazepam **OR** LORazepam, oxyCODONE, polyethylene glycol, polyvinyl alcohol  Physical Exam Vitals and nursing note reviewed.  Constitutional:      General: She is not in acute distress.    Appearance: She is ill-appearing.  Pulmonary:     Effort: No respiratory distress.  Skin:    General: Skin is warm and dry.  Neurological:     Mental Status: She is alert and oriented to person, place, and time.     Motor: Weakness present.     Comments: Drowsy   Psychiatric:        Attention and Perception: Attention normal.        Behavior: Behavior is cooperative.        Cognition and Memory: Cognition and memory normal.             Vital Signs: BP 104/73 (BP Location: Left Arm)   Pulse (!) 57   Temp 97.7 F (36.5 C) (Oral)   Resp 17   Ht '5\' 3"'$  (1.6 m)   Wt 53.5 kg   SpO2 96%   BMI 20.89 kg/m  SpO2: SpO2: 96 % O2 Device: O2 Device: Nasal Cannula O2 Flow Rate: O2 Flow Rate (L/min): 2 L/min  Intake/output summary:  Intake/Output Summary (Last 24 hours) at 06/26/2022 1032 Last data filed at 06/26/2022 1000 Gross per 24 hour  Intake 240 ml  Output 225 ml  Net 15 ml   LBM: Last BM Date : 06/21/22 Baseline Weight: Weight: 61.4 kg Most recent weight:  Weight: 53.5 kg       Palliative Assessment/Data: PPS 40%      Patient Active Problem List   Diagnosis Date Noted   Protein-calorie malnutrition, severe 06/23/2022   Sacral fracture ([place]) 06/21/2022   Heart failure ([place]) 06/21/2022   CHF (congestive heart failure) ([place]) 06/20/2022   Orthostatic hypotension 06/04/2022   Chronic rhinitis 05/23/2022   Edema 05/23/2022   Pruritus of scalp 05/23/2022   Aortic atherosclerosis ([place]) 12/15/2021   Frequent stools 03/24/2021   Abdominal pain 03/24/2021   Loss of weight 03/24/2021   Osteoporosis 08/26/2020   History of revision of total replacement of right hip joint 08/26/2020   Hemorrhoids 08/26/2020   Fracture of proximal phalanx of finger 07/02/2020   Disorder of skeletal muscle 01/07/2020   Muscular deconditioning 01/07/2020   Dysphagia 03/30/2019   Constipation 03/30/2019   Chronic hypoxemic respiratory failure ([place])    Oxygen dependent    Cardiac pacemaker in situ 01/23/2019   Sick sinus syndrome ([place]) 01/22/2019   Acute kidney injury superimposed on chronic kidney disease ([place]) 01/21/2019   Acute on chronic systolic heart failure ([place]) 12/25/2018   Tachy-brady syndrome ([place]) 12/25/2018   Atrial fibrillation, chronic ([place]) 12/24/2018   Congestive heart failure ([place]) 12/24/2018   Chronic atrial fibrillation ([place]) 12/24/2018   Malnutrition, calorie ([place]) 11/20/2018   Atrial fibrillation with rapid ventricular response ([place]) 11/18/2018   Community acquired pneumonia 11/18/2018   Acute on chronic diastolic heart failure ([place]) 11/18/2018   Trochanteric bursitis of left hip 05/23/2018   Stroke (cerebrum) ([place]) 05/19/2018   Hip fracture ([place]) 12/31/2017   Foreign body of both ears 07/01/2017   Presbycusis of both ears 07/01/2017   PAF (paroxysmal atrial fibrillation) (HCC)    Nonsustained ventricular tachycardia (HCC)    Mitral valve regurgitation    Ventricular premature beats    Bradycardia    Essential hypertension     Hypokalemia    Diverticulitis 08/09/2016   Gastroesophageal reflux disease 07/26/2016  Globus pharyngeus 07/26/2016   Hoarseness 07/26/2016   Diverticulosis of colon 04/22/2016   Diverticulosis of large intestine 04/22/2016   Generalized anxiety disorder 04/22/2016   Irritable bowel syndrome 04/22/2016   Mitral valve prolapse 04/22/2016   Mixed hyperlipidemia 04/22/2016   Osteopenia 04/22/2016   Diverticulitis of large intestine 04/22/2016   Low blood pressure 09/24/2014   Hypercholesterolemia    History of depression    Shortness of breath 05/31/2008    Palliative Care Assessment & Plan   Patient Profile: 88 y.o. female  with past medical history of  chronic HFrEF (recent echo showed EF 25%), chronic A-fib, sick sinus syndrome status post bi-chamber PPM, chronic hypoxic respiratory failure on bedtime oxygen at home presented to ED on 06/20/22 from home with worsening hypoxia/shortness of breath, worsening pain, and hypotension. Patient was admitted on 06/20/2022 with acute on chronic HFrEF decompensation, afib with RVR, acute hypoxic respiratory failure, moderate protein calorie malnutrition, recent sacral fracture.   Assessment: Principal Problem:   Acute on chronic systolic heart failure (HCC) Active Problems:   Malnutrition, calorie (HCC)   Atrial fibrillation, chronic (HCC)   Acute kidney injury superimposed on chronic kidney disease ([place])   Sacral fracture (HCC)   Protein-calorie malnutrition, severe   Recommendations/Plan: Continue full comfort measures Continue DNR/DNI as previously documented Patient/family ready for discharge today; DME has been delivered  Continue current comfort focused medication regimen TOC notified and consulted for: family's request for private duty caregiver information PMT will continue to follow peripherally. If there are any imminent needs please call the service directly  Symptom Management Roxicodone 5-'10mg'$  q4h PRN  pain/distress/dyspnea Continue bedtime dose of ativan at '1mg'$  Continue midodrine with goal to keep patient stable for discharge home with hospice - no escalation Continue Miralax PRN constipation and senna-docusate daily Continue albuterol PRN and scheduled lasix Tylenol PRN pain/fever Biotin twice daily Benadryl PRN itching Robinul PRN secretions Haldol PRN agitation/delirium Ativan PRN anxiety/seizure/sleep/distress Zofran PRN nausea/vomiting Liquifilm Tears PRN dry eye  Goals of Care and Additional Recommendations: Limitations on Scope of Treatment: Full Comfort Care  Code Status:    Code Status Orders  (From admission, onward)           Start     Ordered   06/25/22 1205  Do not attempt resuscitation (DNR)  Continuous       Question Answer Comment  In the event of cardiac or respiratory ARREST Do not call a "code blue"   In the event of cardiac or respiratory ARREST Do not perform Intubation, CPR, defibrillation or ACLS   In the event of cardiac or respiratory ARREST Use medication by any route, position, wound care, and other measures to relive pain and suffering. May use oxygen, suction and manual treatment of airway obstruction as needed for comfort.      06/25/22 1207           Code Status History     Date Active Date Inactive Code Status Order ID Comments User Context   06/25/2022 1155 06/25/2022 1207 DNR [id number]  Lin Landsman, NP Inpatient   06/20/2022 1423 06/25/2022 1155 Full Code [id number]  Lequita Halt, MD ED   01/20/2019 2140 01/24/2019 1647 Full Code [id number]  Truett Mainland, DO ED   12/24/2018 1619 12/29/2018 1705 Full Code [id number]  Cristal Deer, MD ED   11/18/2018 1800 11/22/2018 1553 Full Code [id number]  Barton Dubois, MD ED   05/19/2018 1920 05/20/2018 2201 Full Code [id number]  Kathie Dike, MD Inpatient  12/31/2017 1739 01/02/2018 2135 Full Code [id number]  Cristal Ford, DO ED   08/09/2016 1735 08/12/2016 1533 Full Code [id number]  [facility] ED       Prognosis:  < 6 weeks  Discharge Planning: Home with Hospice  Care plan was discussed with primary RN, patient's family, patient, TOC, Dr. Broadus John  Thank you for allowing the Palliative Medicine Team to assist in the care of this patient.   Lin Landsman, NP  Please contact Palliative Medicine Team phone at [phone] for questions and concerns.   *Portions of this note are a verbal dictation therefore any spelling and/or grammatical errors are due to the "[place] [employer]" system interpretation.

## 2022-06-26 NOTE — Plan of Care (Signed)
  Problem: Education: Goal: Knowledge of General Education information will improve Description: Including pain rating scale, medication(s)/side effects and non-pharmacologic comfort measures Outcome: Adequate for Discharge   Problem: Health Behavior/Discharge Planning: Goal: Ability to manage health-related needs will improve Outcome: Adequate for Discharge   Problem: Clinical Measurements: Goal: Ability to maintain clinical measurements within normal limits will improve Outcome: Adequate for Discharge Goal: Will remain free from infection Outcome: Adequate for Discharge Goal: Diagnostic test results will improve Outcome: Adequate for Discharge Goal: Respiratory complications will improve Outcome: Adequate for Discharge Goal: Cardiovascular complication will be avoided Outcome: Adequate for Discharge   Problem: Coping: Goal: Level of anxiety will decrease Outcome: Adequate for Discharge   Problem: Nutrition: Goal: Adequate nutrition will be maintained Outcome: Adequate for Discharge   Problem: Elimination: Goal: Will not experience complications related to bowel motility Outcome: Adequate for Discharge Goal: Will not experience complications related to urinary retention Outcome: Adequate for Discharge   Problem: Pain Managment: Goal: General experience of comfort will improve Outcome: Adequate for Discharge   Problem: Safety: Goal: Ability to remain free from injury will improve Outcome: Adequate for Discharge   Problem: Skin Integrity: Goal: Risk for impaired skin integrity will decrease Outcome: Adequate for Discharge   Problem: Education: Goal: Knowledge of the prescribed therapeutic regimen will improve Outcome: Adequate for Discharge   Problem: Coping: Goal: Ability to identify and develop effective coping behavior will improve Outcome: Adequate for Discharge   Problem: Role Relationship: Goal: Family's ability to cope with current situation will  improve Outcome: Adequate for Discharge Goal: Ability to verbalize concerns, feelings, and thoughts to partner or family member will improve Outcome: Adequate for Discharge

## 2022-06-26 NOTE — Discharge Summary (Signed)
Physician Discharge Summary  Carol Hardin YTK:[id number] DOB: February 07, 1933 DOA: 06/20/2022  PCP: Celene Squibb, MD  Admit date: 06/20/2022 Discharge date: 06/26/2022  Time spent: 35 minutes  Recommendations for Outpatient Follow-up:  Discharged home with hospice for comfort focused care   Discharge Diagnoses:  Principal Problem:   Acute on chronic systolic heart failure (HCC) Low output heart failure   Atrial fibrillation, chronic (HCC)   Acute kidney injury superimposed on chronic kidney disease ([place])   Malnutrition, calorie ([place])   Sacral fracture ([place])   Protein-calorie malnutrition, severe DNR  Discharge Condition: Guarded  Diet recommendation: Low sodium  Filed Weights   06/23/22 0406 06/24/22 0337 06/25/22 0309  Weight: 53.5 kg 53.2 kg 53.5 kg    History of present illness:   Carol Hardin is a 88 y.o. female with medical history significant of chronic HFrEF EF 25%, MR, chronic A-fib, SSS s/p PPM, chronic hypoxic respiratory failure on at bedtime oxygen at home, presented with persistent hypotension and worsening of hypoxia. Patient started to have bilateral lower extremity swelling 2 to 3 weeks ago, went to see cardiology, and her CHF medication adjusted.  lisinopril was discontinued and patient was started on p.o. Lasix 20 mg daily.   Overnight, family took her pulse ox which was 70s.  In ED, BP soft, Afib w/ mild tachycardia, CXr w/ pulm vasc congestion, b/l pleural effusions, admitted    Hospital Course:   Acute on chronic systolic heart failure (HCC) Echocardiogram with reduced LV systolic function EF 55-73%, with global hypokinesis.  Moderate MR -Poor response to diuretics thus far, also limited by hypotension, history of longstanding orthostatic hypotension as well -Now in low output CHF state, on high-dose midodrine -Seen by cardiology, she is not a candidate for advanced cardiac therapies, inotropes etc. discussed poor prognosis with patient and family on  multiple occasions now -Cardiology felt nothing further to offer, recommended palliative care -Seen by palliative team yesterday, decision made to go home with hospice services   Atrial fibrillation, chronic ([place]) Patient with intermittent ventricular pacing. -Holding carvedilol with hypotension, continue apixaban   Chronic hypoxic respiratory failure  -On home O2 2 L at baseline at bedtime   Acute kidney injury superimposed on chronic kidney disease ([place]) CKD stage 3b. Hyponatremia, hypokalemia  -Resolved   Severe protein calorie malnutrition -Continue supplements as tolerated   Sacral fracture ([place]) 09/04 sacral ala fracture.  -Treated with narcotics  Consultants: Cardiology, palliative care     Discharge Exam: Vitals:   06/25/22 1911 06/26/22 0750  BP: 94/70 104/73  Pulse: (!) 57 (!) 57  Resp: 17 17  Temp: 97.6 F (36.4 C) 97.7 F (36.5 C)  SpO2: 90% 96%    General exam: Frail chronically ill elderly female sitting up in bed, AAOx3, no distress HEENT: Positive JVD CVS: S1-S2, regular rhythm, systolic murmur Lungs: Bilateral Rales noted Abdomen: Soft, nontender, nondistended, bowel sounds present Extremities: No edema Skin: No rashes Psychiatry: Flat affect Discharge Instructions   Discharge Instructions     Diet - low sodium heart healthy   Complete by: As directed    Increase activity slowly   Complete by: As directed       Allergies as of 06/26/2022       Reactions   Omeprazole Other (See Comments)   siezure   Flagyl [metronidazole] Nausea And Vomiting   Amiodarone    Aripiprazole Other (See Comments)   Unknown reaction   Clindamycin/lincomycin Other (See Comments)   Bacterial infection /  burning in throat   Gabapentin Itching   Hylan G-f 20 Other (See Comments)   Unknown reaction   Metoprolol Tartrate    Paroxetine    Paroxetine Hcl Other (See Comments)   Headaches (a long time ago- pt doesn't really remember)   Statins Other (See  Comments)   No specific reaction given-patient states that she was advised not to take by physician   Sulfa Antibiotics Diarrhea, Other (See Comments)   Colitis   Toprol Xl [metoprolol Succinate] Other (See Comments)   Dizziness--pt doesn't really remember    Vancomycin Itching, Other (See Comments)   Pt reports that they gave it too fast- she started having itching in the scalp   Penicillins Rash   DID THE REACTION INVOLVE: Swelling of the face/tongue/throat, SOB, or low BP? No Sudden or severe rash/hives, skin peeling, or the inside of the mouth or nose? Yes Did it require medical treatment? Unknown When did it last happen?  Over 10 years     If all above answers are "NO", may proceed with cephalosporin use.        Medication List     STOP taking these medications    acetaminophen 325 MG tablet Commonly known as: TYLENOL   carvedilol 3.125 MG tablet Commonly known as: COREG   divalproex 250 MG 24 hr tablet Commonly known as: Depakote ER   Ensure   Fluocinolone Acetonide Scalp 0.01 % Oil   HYDROcodone-acetaminophen 5-325 MG tablet Commonly known as: NORCO/VICODIN   lisinopril 2.5 MG tablet Commonly known as: ZESTRIL   Vitamin D-3 25 MCG (1000 UT) Caps       TAKE these medications    albuterol 108 (90 Base) MCG/ACT inhaler Commonly known as: VENTOLIN HFA Inhale 2 puffs into the lungs every 6 (six) hours as needed for wheezing or shortness of breath.   apixaban 2.5 MG Tabs tablet Commonly known as: Eliquis TAKE 1 TABLET(2.5 MG) BY MOUTH TWICE DAILY   azelastine 0.1 % nasal spray Commonly known as: ASTELIN Place 2 sprays into both nostrils 2 (two) times daily as needed for rhinitis.   furosemide 20 MG tablet Commonly known as: LASIX Take 20 mg by mouth as needed for fluid.   Gemtesa 75 MG Tabs Generic drug: Vibegron Take 75 mg by mouth daily.   LORazepam 1 MG tablet Commonly known as: ATIVAN Take 1 tablet (1 mg total) by mouth every 4 (four) hours  as needed for anxiety, seizure or sleep (distress). What changed:  when to take this reasons to take this   midodrine 10 MG tablet Commonly known as: PROAMATINE Take 1 tablet (10 mg total) by mouth 2 (two) times daily with a meal.   oxyCODONE 5 MG immediate release tablet Commonly known as: Oxy IR/ROXICODONE Take 1-2 tablets (5-10 mg total) by mouth every 4 (four) hours as needed for moderate pain or severe pain (dyspnea, distress).   SYSTANE OP Apply to eye as needed.       Allergies  Allergen Reactions   Omeprazole Other (See Comments)    siezure   Flagyl [Metronidazole] Nausea And Vomiting   Amiodarone    Aripiprazole Other (See Comments)    Unknown reaction   Clindamycin/Lincomycin Other (See Comments)    Bacterial infection / burning in throat   Gabapentin Itching   Hylan G-F 20 Other (See Comments)    Unknown reaction   Metoprolol Tartrate    Paroxetine    Paroxetine Hcl Other (See Comments)    Headaches (a  long time ago- pt doesn't really remember)   Statins Other (See Comments)    No specific reaction given-patient states that she was advised not to take by physician   Sulfa Antibiotics Diarrhea and Other (See Comments)    Colitis    Toprol Xl [Metoprolol Succinate] Other (See Comments)    Dizziness--pt doesn't really remember    Vancomycin Itching and Other (See Comments)    Pt reports that they gave it too fast- she started having itching in the scalp   Penicillins Rash    DID THE REACTION INVOLVE: Swelling of the face/tongue/throat, SOB, or low BP? No Sudden or severe rash/hives, skin peeling, or the inside of the mouth or nose? Yes Did it require medical treatment? Unknown When did it last happen?  Over 10 years     If all above answers are "NO", may proceed with cephalosporin use.     [place] Follow up.   Why: home hospice Contact information: [address] [place] [address] [phone]                   The results of significant diagnostics from this hospitalization (including imaging, microbiology, ancillary and laboratory) are listed below for reference.    Significant Diagnostic Studies: DG Chest 2 View  Result Date: 06/20/2022 CLINICAL DATA:  Shortness of breath. EXAM: CHEST - 2 VIEW COMPARISON:  06/04/2022 FINDINGS: Left-sided pacemaker unchanged. Lungs are adequately inflated and demonstrate new bibasilar opacification left greater than right likely small effusions with associated atelectasis although infection in the lung bases is possible. Subtle hazy prominence of the central pulmonary vessels which can be seen with mild vascular congestion. Stable moderate cardiomegaly. Remainder of the exam is unchanged. IMPRESSION: 1. New bibasilar opacification left greater than right likely small effusions with associated atelectasis, although infection in the lung bases is possible. 2. Stable moderate cardiomegaly with suggestion of mild vascular congestion. Electronically Signed   By: Marin Olp M.D.   On: 06/20/2022 11:59   CT Hip Left Wo Contrast  Result Date: 06/14/2022 CLINICAL DATA:  Hip trauma, fracture suspected, xray done EXAM: CT OF THE LEFT HIP WITHOUT CONTRAST TECHNIQUE: Multidetector CT imaging of the left hip was performed according to the standard protocol. Multiplanar CT image reconstructions were also generated. RADIATION DOSE REDUCTION: This exam was performed according to the departmental dose-optimization program which includes automated exposure control, adjustment of the mA and/or kV according to patient size and/or use of iterative reconstruction technique. COMPARISON:  X-ray left hip and pelvis 06/14/2022, CT abdomen pelvis 03/31/2021 FINDINGS: Bones/Joint/Cartilage Proximal left femur intramedullary nail fixation. No CT findings suggest surgical hardware complication. No evidence of fracture or dislocation of the left hip. Partially visualized acute  nondisplaced fracture of the left sacral ala (6:46, 3:9). No evidence of severe arthropathy. No aggressive appearing focal bone abnormality. Ligaments Suboptimally assessed by CT. Muscles and Tendons Grossly unremarkable Soft tissues Mild subcutaneus soft tissue edema of the left gluteal soft tissues. Other: Visualized pelvis demonstrates colonic diverticulosis. Likely degenerative uterine fibroids. Atherosclerotic plaque. Persistent small volume ascites. Otherwise grossly unremarkable. IMPRESSION: 1. Partially visualized acute nondisplaced fracture of the left sacral ala. 2.  Aortic Atherosclerosis (ICD10-I70.0). Electronically Signed   By: Iven Finn M.D.   On: 06/14/2022 21:40   CT Cervical Spine Wo Contrast  Result Date: 06/14/2022 CLINICAL DATA:  Trauma, fall EXAM: CT CERVICAL SPINE WITHOUT CONTRAST TECHNIQUE: Multidetector CT imaging of the cervical spine was performed  without intravenous contrast. Multiplanar CT image reconstructions were also generated. RADIATION DOSE REDUCTION: This exam was performed according to the departmental dose-optimization program which includes automated exposure control, adjustment of the mA and/or kV according to patient size and/or use of iterative reconstruction technique. COMPARISON:  12/20/2020 FINDINGS: Alignment: Alignment of posterior margins of vertebral bodies is unremarkable. Skull base and vertebrae: No recent fracture is seen. Degenerative changes are noted, more so from C4-C7 levels. Soft tissues and spinal canal: There is no central spinal stenosis. Disc levels: There is encroachment of neural foramina from C3-C7 levels. Upper chest: Centrilobular emphysema is seen. Other: There is inhomogeneous attenuation in thyroid. IMPRESSION: No recent fracture is seen in cervical spine. Cervical spondylosis with encroachment of neural foramina from C3-C7 levels. No significant interval changes are noted. Electronically Signed   By: Elmer Picker M.D.   On:  06/14/2022 19:37   CT Head Wo Contrast  Result Date: 06/14/2022 CLINICAL DATA:  Trauma, fall EXAM: CT HEAD WITHOUT CONTRAST TECHNIQUE: Contiguous axial images were obtained from the base of the skull through the vertex without intravenous contrast. RADIATION DOSE REDUCTION: This exam was performed according to the departmental dose-optimization program which includes automated exposure control, adjustment of the mA and/or kV according to patient size and/or use of iterative reconstruction technique. COMPARISON:  02/16/2021 FINDINGS: Brain: No acute intracranial findings are seen. There are no signs of bleeding within the cranium. Cortical sulci are prominent. There is no focal edema or mass effect. Vascular: Unremarkable. Skull: No fracture is seen in calvarium. Sinuses/Orbits: Unremarkable. Other: None. IMPRESSION: No acute intracranial findings are seen in noncontrast CT brain. Atrophy. Electronically Signed   By: Elmer Picker M.D.   On: 06/14/2022 19:32   DG Hip Unilat W or Wo Pelvis 2-3 Views Left  Result Date: 06/14/2022 CLINICAL DATA:  Trauma, fall EXAM: DG HIP (WITH OR WITHOUT PELVIS) 2-3V LEFT COMPARISON:  12/20/2020 FINDINGS: No recent fracture or dislocation is seen. There is previous internal fixation with intramedullary rod in proximal left femur. Smooth marginated calcification is noted along the inner margin of the neck of the left femur. These findings appear stable. IMPRESSION: No recent fracture or dislocation is seen pelvis and left hip. Previous internal fixation in left femur. Electronically Signed   By: Elmer Picker M.D.   On: 06/14/2022 19:30   DG Chest 2 View  Result Date: 06/04/2022 CLINICAL DATA:  Worsening shortness of breath. EXAM: CHEST - 2 VIEW COMPARISON:  April 29, 2022 FINDINGS: A multi lead AICD is noted. The cardiac silhouette is markedly enlarged and unchanged in size. The lungs are hyperinflated with mild, diffuse, chronic appearing increased interstitial  lung markings. Mild atelectasis is seen within the bilateral lung bases. There is no evidence of a pleural effusion or pneumothorax. The visualized skeletal structures are unremarkable. IMPRESSION: 1. Stable cardiomegaly and COPD with mild bibasilar atelectasis. Electronically Signed   By: Virgina Norfolk M.D.   On: 06/04/2022 17:42    Microbiology: Recent Results (from the past 240 hour(s))  SARS Coronavirus 2 by RT PCR (hospital order, performed in [facility] hospital lab) *cepheid single result test* Anterior Nasal Swab     Status: None   Collection Time: 06/20/22 12:38 PM   Specimen: Anterior Nasal Swab  Result Value Ref Range Status   SARS Coronavirus 2 by RT PCR NEGATIVE NEGATIVE Final    Comment: (NOTE) SARS-CoV-2 target nucleic acids are NOT DETECTED.  The SARS-CoV-2 RNA is generally detectable in upper and lower respiratory specimens  during the acute phase of infection. The lowest concentration of SARS-CoV-2 viral copies this assay can detect is 250 copies / mL. A negative result does not preclude SARS-CoV-2 infection and should not be used as the sole basis for treatment or other patient management decisions.  A negative result may occur with improper specimen collection / handling, submission of specimen other than nasopharyngeal swab, presence of viral mutation(s) within the areas targeted by this assay, and inadequate number of viral copies (<250 copies / mL). A negative result must be combined with clinical observations, patient history, and epidemiological information.  Fact Sheet for Patients:   https://www.patel.info/  Fact Sheet for Healthcare Providers: https://hall.com/  This test is not yet approved or  cleared by the [place] FDA and has been authorized for detection and/or diagnosis of SARS-CoV-2 by FDA under an Emergency Use Authorization (EUA).  This EUA will remain in effect (meaning this test can be used)  for the duration of the COVID-19 declaration under Section 564(b)(1) of the Act, 21 U.S.C. section 360bbb-3(b)(1), unless the authorization is terminated or revoked sooner.  Performed at [facility], [place] [address], [place], [place] [address]      Labs: Basic Metabolic Panel: Recent Labs  Lab 06/21/22 0431 06/22/22 0513 06/23/22 0504 06/24/22 0328 06/25/22 0427  NA 131* 135 136 136 136  K 3.7 3.2* 3.5 4.9 4.4  CL 100 101 102 102 97*  CO2 '22 27 29 28 29  '$ GLUCOSE 94 89 82 89 92  BUN 31* '22 21 22 22  '$ CREATININE 1.33* 1.20* 0.95 1.06* 1.12*  CALCIUM 8.4* 8.2* 8.3* 8.6* 9.2   Liver Function Tests: Recent Labs  Lab 06/20/22 1053  AST 47*  ALT 31  ALKPHOS 58  BILITOT 1.4*  PROT 7.9  ALBUMIN 2.9*   No results for input(s): "LIPASE", "AMYLASE" in the last 168 hours. No results for input(s): "AMMONIA" in the last 168 hours. CBC: Recent Labs  Lab 06/20/22 1053 06/24/22 0328 06/25/22 0427  WBC 6.8 7.4 6.6  HGB 11.6* 11.0* 11.0*  HCT 35.7* 34.4* 34.6*  MCV 104.4* 105.5* 106.1*  PLT 207 245 252   Cardiac Enzymes: No results for input(s): "CKTOTAL", "CKMB", "CKMBINDEX", "TROPONINI" in the last 168 hours. BNP: BNP (last 3 results) Recent Labs    06/04/22 1720 06/20/22 1053  BNP 712.1* 2,262.6*    ProBNP (last 3 results) No results for input(s): "PROBNP" in the last 8760 hours.  CBG: No results for input(s): "GLUCAP" in the last 168 hours.     Signed:  Domenic Polite MD.  [facility] 06/26/2022, 10:52 AM

## 2022-06-29 ENCOUNTER — Encounter: Payer: Self-pay | Admitting: Cardiovascular Disease

## 2022-08-19 ENCOUNTER — Ambulatory Visit: Payer: Medicare Other | Admitting: Gastroenterology

## 2022-08-24 ENCOUNTER — Ambulatory Visit: Payer: Medicare Other | Admitting: Gastroenterology

## 2022-09-10 DEATH — deceased

## 2022-09-17 ENCOUNTER — Ambulatory Visit: Payer: Medicare Other | Admitting: Cardiovascular Disease

## 2022-09-20 ENCOUNTER — Ambulatory Visit: Payer: Medicare Other | Admitting: Neurology

## 2022-11-24 ENCOUNTER — Ambulatory Visit: Payer: Medicare Other | Admitting: Allergy & Immunology

## 2022-12-30 ENCOUNTER — Telehealth: Payer: Self-pay

## 2022-12-30 NOTE — Telephone Encounter (Signed)
Pt deceased date: 06-Sep-2022

## 2023-04-05 ENCOUNTER — Encounter: Payer: Medicare Other | Admitting: Internal Medicine
